# Patient Record
Sex: Female | Born: 1945
Health system: Southern US, Community
[De-identification: ages and names within clinical notes are randomized; demographics above are authoritative.]

## PROBLEM LIST (undated history)

## (undated) DIAGNOSIS — I2510 Atherosclerotic heart disease of native coronary artery without angina pectoris: Secondary | ICD-10-CM

## (undated) DIAGNOSIS — I639 Cerebral infarction, unspecified: Secondary | ICD-10-CM

## (undated) DIAGNOSIS — J45909 Unspecified asthma, uncomplicated: Secondary | ICD-10-CM

## (undated) DIAGNOSIS — I471 Supraventricular tachycardia, unspecified: Secondary | ICD-10-CM

## (undated) DIAGNOSIS — I499 Cardiac arrhythmia, unspecified: Secondary | ICD-10-CM

## (undated) DIAGNOSIS — M5030 Other cervical disc degeneration, unspecified cervical region: Secondary | ICD-10-CM

## (undated) DIAGNOSIS — S62102A Fracture of unspecified carpal bone, left wrist, initial encounter for closed fracture: Secondary | ICD-10-CM

## (undated) DIAGNOSIS — E785 Hyperlipidemia, unspecified: Secondary | ICD-10-CM

## (undated) DIAGNOSIS — M1611 Unilateral primary osteoarthritis, right hip: Secondary | ICD-10-CM

## (undated) DIAGNOSIS — I5020 Unspecified systolic (congestive) heart failure: Secondary | ICD-10-CM

## (undated) DIAGNOSIS — I2690 Septic pulmonary embolism without acute cor pulmonale: Secondary | ICD-10-CM

## (undated) DIAGNOSIS — K219 Gastro-esophageal reflux disease without esophagitis: Secondary | ICD-10-CM

## (undated) DIAGNOSIS — Z9842 Cataract extraction status, left eye: Secondary | ICD-10-CM

## (undated) DIAGNOSIS — M5020 Other cervical disc displacement, unspecified cervical region: Secondary | ICD-10-CM

## (undated) DIAGNOSIS — I447 Left bundle-branch block, unspecified: Secondary | ICD-10-CM

## (undated) DIAGNOSIS — I700 Atherosclerosis of aorta: Secondary | ICD-10-CM

## (undated) DIAGNOSIS — M810 Age-related osteoporosis without current pathological fracture: Secondary | ICD-10-CM

## (undated) DIAGNOSIS — I4719 Other supraventricular tachycardia: Secondary | ICD-10-CM

## (undated) DIAGNOSIS — I441 Atrioventricular block, second degree: Secondary | ICD-10-CM

## (undated) DIAGNOSIS — R0600 Dyspnea, unspecified: Secondary | ICD-10-CM

## (undated) DIAGNOSIS — Z9841 Cataract extraction status, right eye: Secondary | ICD-10-CM

## (undated) DIAGNOSIS — M1990 Unspecified osteoarthritis, unspecified site: Secondary | ICD-10-CM

## (undated) DIAGNOSIS — I428 Other cardiomyopathies: Secondary | ICD-10-CM

## (undated) DIAGNOSIS — I429 Cardiomyopathy, unspecified: Secondary | ICD-10-CM

## (undated) DIAGNOSIS — N179 Acute kidney failure, unspecified: Secondary | ICD-10-CM

## (undated) DIAGNOSIS — L570 Actinic keratosis: Secondary | ICD-10-CM

## (undated) DIAGNOSIS — M48061 Spinal stenosis, lumbar region without neurogenic claudication: Secondary | ICD-10-CM

## (undated) HISTORY — PX: JOINT REPLACEMENT: SHX530

## (undated) HISTORY — DX: Supraventricular tachycardia, unspecified: I47.10

## (undated) HISTORY — DX: Cerebral infarction, unspecified: I63.9

## (undated) HISTORY — DX: Other cervical disc displacement, unspecified cervical region: M50.20

## (undated) HISTORY — PX: OTHER SURGICAL HISTORY: SHX169

## (undated) HISTORY — DX: Actinic keratosis: L57.0

## (undated) HISTORY — DX: Age-related osteoporosis without current pathological fracture: M81.0

## (undated) HISTORY — DX: Unspecified asthma, uncomplicated: J45.909

## (undated) HISTORY — DX: Supraventricular tachycardia: I47.1

## (undated) HISTORY — DX: Cardiomyopathy, unspecified: I42.9

## (undated) HISTORY — DX: Acute kidney failure, unspecified: N17.9

## (undated) HISTORY — PX: APPENDECTOMY: SHX54

## (undated) HISTORY — DX: Fracture of unspecified carpal bone, left wrist, initial encounter for closed fracture: S62.102A

## (undated) HISTORY — PX: CORONARY ANGIOPLASTY: SHX604

## (undated) HISTORY — PX: TONSILLECTOMY: SUR1361

## (undated) HISTORY — DX: Septic pulmonary embolism without acute cor pulmonale: I26.90

## (undated) HISTORY — DX: Hyperlipidemia, unspecified: E78.5

## (undated) NOTE — Interval H&P Note (Signed)
 Formatting of this note might be different from the original. NEUROSURGERY INTERVAL NOTE  01/12/2016   I have personally examined the patient, and gone over  her symptoms, and plan for surgery. There have been no significant interval changes since I last saw the patient.   We will plan to proceed with scheduled Left  lumbar 2-4 hemi-laminectomies, possible adjacent levels,  medial facetectomies and foraminotomies.  The patient's name, medical record number and site of surgery have all been confirmed.  I have explicitly discussed the risks, benefits and alternatives of surgery previously.  I have personally marked the planned site/side of surgery. All of the patient's questions have been answered.  I also discussed risks and options regarding possible blood transfusion, per the [facility] Blood Safety Act.  The patient has demonstrated full understanding of the associated risks of surgery, and would like to proceed with surgery despite the associated risks. The consents have been signed/witnessed, and all of the patient's questions/concerns have been addressed.  I appreciate the opportunity to care for this pleasant patient. Electronically signed by: RIPLEY BROCKS RAO MD 01/12/2016 12:18 PM  Electronically signed by Melanee RIPLEY BROCKS (M.D.), M.D. at 01/12/2016 12:18 PM PDT

## (undated) NOTE — Consults (Signed)
 Associated Order(s): INPATIENT OCCUPATIONAL THERAPY CONSULT Formatting of this note is different from the original. OCCUPATIONAL THERAPY INPATIENT INITIAL ASSESSMENT with PLAN CERTIFICATION  Treatment Diagnosis: decrease in activities of daily living due to LUMBAR RADICULOPATHY [M54.16] LOW BACK PAIN [M54.5]SPINAL STENOSIS OF LUMBAR SPINE [M48.06]ACQUIRED SCOLIOSIS [M41.9]; s/p Left lumbar 2-4 hemi-laminectomies, medial facetectomies and foraminotomies  SUBJECTIVE   Room#: 518 Date of hospitalization: 01/12/2016 Patient c/o pain in back area Current level of pain: 8/10 at rest, 9/10 with activity Cognition: oriented to person, place, situation Behavior: cooperative Prior level of function: no limitation per patient  Job/occupation: employed, occupation: retired Comptroller: selfcare/ housework Language preference: English Cultural/religious considerations for care: Not Applicable Living situation: lives alone in a 1 story house  I have reviewed the medical record for relevant medical history, medication, imaging and testing.  OBJECTIVE   Precautions: fall risk, spine precautions Physical assessment:      Upper extremities:            Range of motion, left:                Shoulder: within normal limits                Elbow: within normal limits                Forearm: within normal limits                Wrist: within normal limits                Finger: within normal limits                Thumb: within normal limits                           Range of motion, right:                Shoulder: within normal limits                Elbow: within normal limits                Forearm: within normal limits                Wrist: within normal limits                Finger: within normal limits                Thumb: within normal limits                  Strength, left:                 Shoulder: 4-/5                Elbow: 4-/5                 Forearm: 4-/5                 Hand: 4-/5           Strength, right:                Shoulder: 3-/5                Elbow: 3+/5                Forearm: 3+/5  Hand: 3+/5           Sensation, left: intact           Sensation, right: intact           Edema, left: within functional limits           Edema, right: within functional limits           Muscle tone, left: within normal limits           Muscle tone, right: within normal limits       Visual-perceptual assessment: within normal limits      Gross motor processing/coordination: bilateral within normal limits      Fine motor processing/coordination: bilateral within normal limits; hand dominance: right      Endurance: fair-      Communication: WNL      ADL evaluation:            Grooming / hygiene: minimal assistance           Feeding: standby assistance           Dressing UE: moderate assistance           Dressing LE: maximal assistance           Bathing UE: not tested           Bathing LE: not tested           Toileting: not tested                     Bed mobility: moderate assistance           Transfers: moderate assistance Balance:            Sitting balance, static: within normal limits           Sitting balance, dynamic: within normal limits           Standing balance, static: impaired: fair+           Standing balance, dynamic: impaired: fair   LEARNING ASSESSMENT:  Learning preferences: verbal instructions and demonstration Learning barrier/s: none Educational needs: ADL, use of devices/equipment, safety and postural  ASSESSMENT: Impairments: impaired strength, impaired endurance, impaired balance, impaired functional mobility, impaired selfcare  Functional limitations: difficulty with eating/ grooming/ hygiene, difficulty with upper body dressing, difficulty with lower body dressing, difficulty with bed mobility, difficulty with functional transfers Clinical Indication:    1. Patient is at a risk for falls due to impaired balance,  decreased strength    2. Patient has impaired mobility due to LUMBAR RADICULOPATHY [M54.16] LOW BACK PAIN [M54.5]SPINAL STENOSIS OF LUMBAR SPINE [M48.06]ACQUIRED SCOLIOSIS [M41.9]; s/p Left lumbar 2-4 hemi-laminectomies, medial facetectomies and foraminotomies    3. Patient is unable to complete ADL tasks due to impaired endurance, pain  Patient will benefit from additional occupational therapy intervention based on above findings.  PLAN OF CARE  Patient goal statement: To go home Long term goal/s: Patient will demonstrate improved self care / ADL:  3/3 recall of spine precautions with good understanding, independent with eating, independent with grooming/ hygiene, independent with upper body dressing, minimal assistance with lower dressing,  standby assistance with bed mobility, standby assistance with functional transfers Rehabilitation potential for above goals: good Above goals to be met within 30 days  TREATMENT GIVEN Education given: activities of daily living training, body positioning and safety awareness Treatment given: activities of daily living x 25 minutes Response to treatment:      Level of  pain: 9/10       3/3 recall of spine precautions noted post education, review. Moderate cues provided with tasks for safety, positioning, body mechanics with fair carryover noted.  Disposition: call button in reach, in chair with seatbelt on, in bed 3 rails and with bed alarm on no apparent distress, nursing staff notified and with staff  Responsibility of patient care and safety returned to nursing, and nursing agrees.  PLAN Treatment plan: assess equipment needs, balance retraining, bed mobility training, body mechanics, caregiver instruction, energy conservation, joint protection, patient / family education, positioning, ROM, safety training, strengthening, therapeutic exercises, transfer training and activities of daily living training  Frequency of visits: 3 times per week for 4  weeks Reassessment: patient to be reassessed in 30 days, or earlier if indicated.  RECOMMENDATIONS Continue occupational therapy, depending on patient progress  Equipment Recommended:  front wheeled walker, commode, and shower chair  I have discussed the relative risks, benefits, and alternatives for treatment of this problem with the patient.  Patient verbalizes understanding and agrees with treatment program.  Ordering physician:  Please document your approval of the plan of care by co-signing this initial assessment note. If you do not approve the plan, please notify the therapist of disapproval and/or modification to the plan.   GERALD B. ARAFOL, OTR/L Rzoo#[id number] 01/13/2016   Total Treatment Minutes: 45 Total Timed Code Minutes: 25   Electronically signed by Melanee Ripley BROCKS (M.D.), M.D. at 01/13/2016  5:58 PM PDT

## (undated) NOTE — Progress Notes (Signed)
 Formatting of this note might be different from the original. Discharge  Time: 1708  To: home  Accompanied by: son Via: WHEELCHAIR Nursing summary at discharge: PT WAS GIVEN HER DC INSTRUCTIONS VERBALLY AND WRITTEN. PT UNDERSTANDS HER DC EDUCATIONS AND AGREES WITH DC PLAN FOR HOME TODAY. HOSPITAL BED WILL BE DELIVERED TONIGHT. SON IS TAKING PT HOME.  Electronically signed by: BECKEY JACKSON HARRIER RN 01/14/2016 5:10 PM    Electronically signed by HARRIER BECKEY JACKSON (R.N.), R.N. at 01/14/2016  5:10 PM PDT

## (undated) NOTE — Anesthesia Postprocedure Evaluation (Signed)
 Formatting of this note might be different from the original. ANESTHESIA POST-OP NOTE  BP 136/77 mmHg  Pulse 90  Temp(Src) 98.6 F (37 C)  Resp 20  Ht 1.702 m (5' 7)  Wt 99.7 kg (219 lb 12.8 oz)  BMI 34.42 kg/m2  SpO2 98%  Breastfeeding? No Patient is post op day #1, s/p Procedure(s): Left  lumbar 2-4 hemi-laminectomies, medial facetectomies and foraminotomies No apparent anesthetic complications.Encouraged Pt to do incentive spirometry. BERNARDINO MARS REDDY MD 01/13/2016 11:26 AM  Electronically signed by Jess BERNARDINO MARS (M.D.), M.D. at 01/13/2016 11:26 AM PDT

## (undated) NOTE — Progress Notes (Signed)
 Formatting of this note might be different from the original. 1725: Pt taken off unit by transportation team in wheelchair with son by her side.  Electronically signed by: BECKEY JACKSON HARRIER RN 01/14/2016   Electronically signed by HARRIER BECKEY JACKSON (R.N.), R.N. at 01/14/2016  5:22 PM PDT

## (undated) NOTE — OR Nursing (Signed)
 Formatting of this note might be different from the original. 1746:  TRANSFERRED PATIENT TO PACU VIA BED  SUPPORTED WITH O2 INHALATION VIA MASK.  REPORT GIVEN TO M. CALDERON, PACU RN.  V/S STABLE.  DRESSINGS DRY AND INTACT TO BACK.Electronically signed by: HADASSAH NELTA KRABBE RN 01/12/2016 5:57 PM  Electronically signed by KRABBE HADASSAH NELTA (R.N.), R.N. at 01/12/2016  6:03 PM PDT

## (undated) NOTE — Progress Notes (Signed)
 Formatting of this note is different from the original. NEUROSURGERY PROGRESS NOTE HOSPITAL DAY: 2  Per Dr. Melanee H&P HPI: Audrey Martinez is a 40 year old female who apparently had a fall at work in 2011. Subsequent to that in 2000 while she started noticing pain and numbness down the LEFT leg up to the thigh and knee. Over the last couple years the symptoms have worsened especially the numbness in the LEFT knee and shin with pain that radiates from the hip and groin down to the knee. Symptoms are worse when she stands or walks and is improved by sitting down. She does tend to lean on the cart when she shops to help relieve the leg symptoms. She denies any RIGHT leg pain. Leg pain is worse than the back and she rates it as 8 on a scale of 1-10. She denies any bowel or bladder dysfunction. She feels that her walking tolerance is getting worse over time Non operative treatments:  Physical therapy: yes Injections: 3 ESI Medications: norco 5/325 twice a day  INTERVAL HPI 01/01/2016  Patient continues to have low back pain that radiates down the LEFT leg down to the thigh and knee and occasionally into the shin. This is worse when she stands and walks. She does note some numbness in the RIGHT foot but no RIGHT leg pain. She denies any bowel or bladder dysfunction. She feels that she has exhausted nonoperative treatments.  DAILY UPDATE: 8/16 POD#1: s/p LEFT L2-4 hemilaminectomies and partial facetectomies, foraminotomies for lateral recess and foraminal decompression of L3 and L4 nerve roots. Doing well post-op. Pain well controlled with minimal use of PCA.  8/17 POD#2: No acute events overnight. Ambulated 13ft with PT. Pain well controlled.   CURRENT INPATIENT MEDICATIONS Scheduled Medications    HYDROcodone -Acetaminophen  1 tablet Oral Q3H  Lactated Ringers  1,000 mL intraVENOUS X1  Multivitamin Therapeutic-Iron-Calcium -FA-Minerals 1 tablet Oral Daily  Gabapentin  900 mg Oral QHS   Baclofen  10 mg Oral TID  dilTIAZem  120 mg Oral Daily  Sodium Chloride   intraVENOUS Q8H  Docusate Sodium  250 mg Oral QHS  Famotidine  20 mg Oral BID  Famotidine  (PF) 20 mg intraVENOUS BID  Or     Famotidine  20 mg Oral BID  Acetaminophen  650 mg Oral Q8H  Docusate Sodium  200 mg Oral BID  Heparin  Porcine 5,000 Units Subcutaneous Q12H   IV Medications   Sodium Chloride  0.9 % with KCl intraVENOUS Last Rate: Stopped (01/13/16 1630)   PRN Medications   Sodium Chloride   intraVENOUS PRN  Magnesium Hydroxide 30 mL Oral Daily PRN  Sennosides 17.2 mg Oral QHS PRN  Ondansetron  4 mg Oral Q6H PRN  hydrALAZINE  10 mg intraVENOUS Q3H PRN  Naloxone 0.2 mg intraVENOUS Q2MIN PRN  Acetaminophen  650 mg Oral Q4H PRN  Or     Acetaminophen  650 mg Rectal Q4H PRN  HYDROmorphone 0.4 mg intraVENOUS Q2H PRN  HYDROcodone -Acetaminophen  2 tablet Oral Q4H PRN  Acetaminophen  650 mg Oral Q4H PRN  Or     Acetaminophen  650 mg Rectal Q4H PRN  Lactulose  30 mL Oral TID PRN  Ondansetron  (PF) 4 mg intraVENOUS Q6H PRN  HYDROmorphone 0.4 mg intraVENOUS Per protocol - multiple doses    VITAL SIGNS Filed Vitals:   01/13/16 1631 01/13/16 1945 01/13/16 2312 01/14/16 0300  BP: 122/77 150/95 141/75 148/84  Pulse: 100 95 104 92  Temp: 98.8 F (37.1 C) 97 F (36.1 C) 97.3 F (36.3 C) 97 F (36.1 C)  Resp: 15 18 18 18   Height:  Weight:      SpO2: 95% 96% 95% 92%    Intake/Output Summary (Last 24 hours) at 01/14/16 0555 Last data filed at 01/14/16 0500  Gross per 24 hour  Intake   1737 ml  Output   2300 ml  Net   -563 ml   Temp (24hrs), Avg:97.5 F (36.4 C), Min:96.5 F (35.8 C), Max:98.8 F (37.1 C)  LABS No results for input(s): WBC, HGB, HCT, PLT, NA, K, CL, CO2, BUN, CR, CA, MG, PHOS, GFR, RBS, DIL, PTT, PT, INR in the last 72 hours.  BLOOD GLUCOSE No data found.  PHYSICAL EXAM:  NEURO: GCS 15, A&Ox4 CN II-XII intact and symmetric, no focal deficit  MOTOR: RUE 5/5 LUE 5/5 RLE 5/5 LLE  5/5  SENSATION: intact to light touch No hyperreflexia No drift, ataxia, or dysmetria  INCISION: wound is clean, dry  IMAGING: No new imaging.   IMPRESSION: Audrey Martinez is a 30 year old female s/p LEFT L2-4 hemilaminectomies and partial facetectomies, foraminotomies for lateral recess and foraminal decompression of L3 and L4 nerve roots.   PLAN: Neuro: - Pain control  - PT/OT  - DC planning.  CV: SBP 100-140 Pulm: ISO 10x per hour GI: Bowel regimen, GI ppx FEN: keep Na 135 - 145, maintain euvolemia, Reg diet GU: voiding independently  ID: afebrile Endo: keep BS 80-160 DVT PPx: TED / SCD  Activity: UOOB to chair TID, Ambulate QID out in halls with assistance Therapies: PT/OT Dispo: CM for discharge planning  Thank you for the opportunity to participate in this patient's care.   Please page neurosurgery at 2020 with any questions, comments, concerns, or for any change in neurologic exam.  Ollis Siva, D.O. Neurosurgery Resident, PGY-3 [facility], pager [phone] 01/14/2016 5:55 AM   Electronically signed by Melanee Ripley BROCKS (M.D.), M.D. at 01/14/2016  1:47 PM PDT  Associated attestation - Melanee Ripley BROCKS (M.D.), M.D. - 01/14/2016  1:47 PM PDT Formatting of this note might be different from the original. Neurosurgery Attending Note    I  Have seen   the patient and agree with the neurosurgery resident's note and treatment plan.   She is ambulating well, able to walk on heel/toes Lumbar incision with dry dressing  ASSESSMENT/PLAN  - d/c to home today - - F/u in NS in 2 weeks for wound check with RN - F/u with Dr. Melanee in 4-6 weeks   - No driving for 4 weeks - Home PT to see patient in 2 weeks to assess mobility - Referral to outpatient PT to start in 4-6 weeks (2-3 x a week for 3-4 weeks):  For core strengthening and lumbar stabilization exercises)   Electronically signed  by: RIPLEY BROCKS RAO MD 01/14/2016 1:46 PM

## (undated) NOTE — Progress Notes (Signed)
 Formatting of this note might be different from the original. 730: DURING REPORT NKE PT STARTED TO VOMIT HER JELLO SHE HAD FOR DINNER. GAVE ONE DOSE OF ZOFRAN  X1.  Electronically signed by: BECKEY JACKSON HARRIER RN 01/13/2016 7:44 AM  Electronically signed by HARRIER BECKEY JACKSON (R.N.), R.N. at 01/13/2016  7:44 AM PDT

## (undated) NOTE — OR Nursing (Signed)
 Formatting of this note might be different from the original. 1508:  RELIEVED R. MUNOZ, RN FOR CHANGE OF SHIFT.  REPORT RECEIVED FROM ABOVE RN.  SURGERY IN PROGRESS AT THIS TIME. NO SPECIMEN AT THIS TIME. Electronically signed by: HADASSAH NELTA KRABBE RN 01/12/2016 3:15 PM  Electronically signed by KRABBE HADASSAH NELTA (R.N.), R.N. at 01/12/2016  3:15 PM PDT

## (undated) NOTE — Discharge Summary (Signed)
 Formatting of this note might be different from the original. OCCUPATIONAL THERAPY DISCHARGE NOTE  Patient is discharged from occupational therapy services. Please refer to previous notes for details.  GERALD B. ARAFOL, OTR/L Rzoo#[id number]  01/22/2016   Electronically signed by Marvene Elna Hsu (O.T.), O.T. at 01/22/2016  3:55 PM PDT

## (undated) NOTE — Discharge Summary (Signed)
 Formatting of this note might be different from the original. PHYSICAL THERAPY Discharge note  SUBJECTIVE:      Referring diagnosis: LUMBAR RADICULOPATHY [M54.16] LOW BACK PAIN [M54.5] SPINAL STENOSIS OF LUMBAR SPINE [M48.06] ACQUIRED SCOLIOSIS [M41.9]      Date of last initial evaluation: 01/13/16      Treatment number: 2      Current level of pain: 0/10      Precautions: spinal precautions  OBJECTIVE:  Functional Mobility:       Rolling: stand by assistance/supervision      Scooting:  stand by assistance/supervision           Supine to sitting: stand by assistance/supervision      Sitting to supine: stand by assistance/supervision      Sitting to standing: stand by assistance/supervision       Standing to sitting: stand by assistance/supervision       Transfer chair to bed: not tested      Transfer bed to chair: stand by assistance/supervision      Transfers toilet: not tested  Gait:       right weight bearing status: full weight bearing Device: front wheeled walker Level of assistance: stand by assistance/supervision Number of persons needed to assist: 1 (due to IV pole) Distance: 350 feet Gait deviations: velocity       Endurance: Fair  Balance:       Sitting balance, static: within normal limits      Sitting balance, dynamic: within normal limits      Standing balance, static: within normal limits      Standing balance, dynamic: within normal limits  PLAN: patient discharged to home   ARLEY RAMAN DAVEY PT 01/15/2016   Electronically signed by Nathaneil ARLEY RAMAN (P.T.), P.T. at 01/15/2016  1:17 PM PDT

## (undated) NOTE — Progress Notes (Signed)
 Formatting of this note might be different from the original. Problem: General Plan of Care (Adult, Obstetrics) Goal: Plan of Care Review Outcome: Ongoing (interventions implemented as appropriate) SUMMARY OF PATIENT PROGRESS:  STATUS: Improving  Primary Problem: SP LEFT L2-4 hemilaminectomies and partial facetectomies, foraminotomies for lateral recess and foraminal decompression of L3 and L4 nerve roots.   Key findings related to primary problem: PT PCA PUMP DC'D AND IS ABLE TO TOLERATE PO INTAKE BETTER NO MORE N/V NOTED. PT SAT IN CHAIR FOR 2 HOURS WORKED WITH P/T AND O/T. DOING WELL NOTED TO BE ABLE TO GET OUT OF BED TO COMMODE WELL. PT PAIN UNDER CONTROL WITH PO PAIN PILLS. PT IS ABLE TO HELP WITH SELF TURNS AND REPOSITIONED. DRESSING TO LOWER BACK SIDE IS INTACT.   Other key findings: WILL CONT WITH CURRENT PLAN OF CARE. Electronically signed by: BECKEY JACKSON HARRIER RN 01/13/2016 5:15 PM     Electronically signed by HARRIER BECKEY JACKSON (R.N.), R.N. at 01/13/2016  5:15 PM PDT

## (undated) NOTE — Progress Notes (Signed)
 Formatting of this note is different from the original. NEUROSURGERY PROGRESS NOTE HOSPITAL DAY: 1  Per Dr. Melanee H&P HPI: Audrey Martinez is a 55 year old female who apparently had a fall at work in 2011. Subsequent to that in 2000 while she started noticing pain and numbness down the LEFT leg up to the thigh and knee. Over the last couple years the symptoms have worsened especially the numbness in the LEFT knee and shin with pain that radiates from the hip and groin down to the knee. Symptoms are worse when she stands or walks and is improved by sitting down. She does tend to lean on the cart when she shops to help relieve the leg symptoms. She denies any RIGHT leg pain. Leg pain is worse than the back and she rates it as 8 on a scale of 1-10. She denies any bowel or bladder dysfunction. She feels that her walking tolerance is getting worse over time Non operative treatments:  Physical therapy: yes Injections: 3 ESI Medications: norco 5/325 twice a day  INTERVAL HPI 01/01/2016  Patient continues to have low back pain that radiates down the LEFT leg down to the thigh and knee and occasionally into the shin. This is worse when she stands and walks. She does note some numbness in the RIGHT foot but no RIGHT leg pain. She denies any bowel or bladder dysfunction. She feels that she has exhausted nonoperative treatments.  DAILY UPDATE: 8/16 POD#1: s/p LEFT L2-4 hemilaminectomies and partial facetectomies, foraminotomies for lateral recess and foraminal decompression of L3 and L4 nerve roots. Doing well post-op. Pain well controlled with minimal use of PCA.   CURRENT INPATIENT MEDICATIONS Scheduled Medications    Lactated Ringers  1,000 mL intraVENOUS X1  Multivitamin Therapeutic-Iron-Calcium -FA-Minerals 1 tablet Oral Daily  Gabapentin  900 mg Oral QHS  Baclofen  10 mg Oral TID  dilTIAZem  120 mg Oral Daily  Sodium Chloride   intraVENOUS Q8H  ceFAZolin in Dextrose  1 g intraVENOUS Q8H   Docusate Sodium  250 mg Oral QHS  Famotidine  20 mg Oral BID  Famotidine  (PF) 20 mg intraVENOUS BID  Or     Famotidine  20 mg Oral BID  Acetaminophen  1,000 mg intraVENOUS Q8H  Followed by     Acetaminophen  650 mg Oral Q8H  Docusate Sodium  200 mg Oral BID  Heparin  Porcine 5,000 Units Subcutaneous Q12H  HYDROmorphone 0.2 mg/ml  intraVENOUS see instruction   IV Medications   Sodium Chloride  0.9 % with KCl intraVENOUS Last Rate: 1,000 mL (01/12/16 2247)   PRN Medications   Sodium Chloride   intraVENOUS PRN  Magnesium Hydroxide 30 mL Oral Daily PRN  Sennosides 17.2 mg Oral QHS PRN  Ondansetron  4 mg Oral Q6H PRN  hydrALAZINE  10 mg intraVENOUS Q3H PRN  Naloxone 0.2 mg intraVENOUS Q2MIN PRN  Acetaminophen  650 mg Oral Q4H PRN  Or     Acetaminophen  650 mg Rectal Q4H PRN  HYDROcodone -Acetaminophen  1 tablet Oral Q4H PRN  HYDROmorphone 0.4 mg intraVENOUS Q2H PRN  HYDROcodone -Acetaminophen  2 tablet Oral Q4H PRN  Acetaminophen  650 mg Oral Q4H PRN  Or     Acetaminophen  650 mg Rectal Q4H PRN  Lactulose  30 mL Oral TID PRN  Ondansetron  (PF) 4 mg intraVENOUS Q6H PRN  HYDROmorphone 0.4 mg intraVENOUS Per protocol - multiple doses    VITAL SIGNS Filed Vitals:   01/12/16 2045 01/12/16 2222 01/12/16 2238 01/12/16 2353  BP: 137/94 139/85  126/73  Pulse: 98 117  105  Temp: 97.7 F (36.5 C) 97.1 F (36.2 C)  97.7 F (36.5 C)  Resp: 16 18  18   Height:   1.702 m (5' 7)   Weight:   99.7 kg (219 lb 12.8 oz)   SpO2: 98% 97%  95%    Intake/Output Summary (Last 24 hours) at 01/13/16 0437 Last data filed at 01/13/16 0300  Gross per 24 hour  Intake   2625 ml  Output    700 ml  Net   1925 ml   Temp (24hrs), Avg:98 F (36.7 C), Min:97.1 F (36.2 C), Max:98.6 F (37 C)  TRANSFUSION HISTORY THIS ENCOUNTER No data found.  LABS No results for input(s): WBC, HGB, HCT, PLT, NA, K, CL, CO2, BUN, CR, CA, MG, PHOS, GFR, RBS, DIL, PTT, PT, INR in the last 72 hours.  BLOOD GLUCOSE No data  found.  PHYSICAL EXAM:  NEURO: GCS 15, A&Ox4 CN II-XII intact and symmetric, no focal deficit  MOTOR: RUE 5/5 LUE 5/5 RLE 5/5 LLE 5/5  SENSATION: intact to light touch No hyperreflexia No drift, ataxia, or dysmetria  INCISION: wound is clean, dry  IMAGING: No new imaging.   IMPRESSION: Audrey Martinez is a 61 year old female s/p LEFT L2-4 hemilaminectomies and partial facetectomies, foraminotomies for lateral recess and foraminal decompression of L3 and L4 nerve roots.   PLAN: Neuro: - Pain control   * Dilaudid PCA -- d/c today  * Start PO meds today - PT/OT   CV: SBP 100-140 Pulm: ISO 10x per hour GI: Bowel regimen, GI ppx FEN: keep Na 135 - 145, maintain euvolemia, Reg diet GU: voiding independently  ID: afebrile Endo: keep BS 80-160 DVT PPx: TED / SCD  Activity: UOOB to chair TID, Ambulate QID out in halls with assistance Therapies: PT/OT Dispo: CM for discharge planning  Thank you for the opportunity to participate in this patient's care.   Please page neurosurgery at 2020 with any questions, comments, concerns, or for any change in neurologic exam.  Ollis Siva, D.O. Neurosurgery Resident, PGY-3 [facility], pager [phone] 01/13/2016 4:37 AM   Electronically signed by Melanee Ripley BROCKS (M.D.), M.D. at 01/13/2016  8:56 AM PDT  Associated attestation - Melanee Ripley BROCKS (M.D.), M.D. - 01/13/2016  8:56 AM PDT Formatting of this note is different from the original. NEUROSURGERY ATTENDING PROGRESS NOTE    I have seen and examined the patient, reviewed any relevant imaging/labs and agree with the neurosurgery resident's note and plan.    INTERVAL HPI 01/13/2016  Patient without leg pain,  C/o nausea  Exam:    Filed Vitals:   01/12/16 2222 01/12/16 2238 01/12/16 2353 01/13/16 0430  BP: 139/85  126/73 141/94  Pulse: 117  105 102  Temp: 97.1 F (36.2 C)  97.7 F (36.5 C) 97 F  (36.1 C)  Resp: 18  18 16   Height:  1.702 m (5' 7)    Weight:  99.7 kg (219 lb 12.8 oz)    SpO2: 97%  95% 94%    Incision:  Dressing dry  Neuro: Mental status:  Alert, interactive Motor: strength 5/5 in BLEs  Sens: intact to TL/pin in BLE except for some subjective baseline numbness in right foot     PLAN  - d/c PCA and start oral pain meds, nausea likely due to PCA  -PT/OT, encourage ambulation  Electronically signed by: RIPLEY BROCKS RAO MD 01/13/2016 8:54 AM

## (undated) NOTE — Consults (Signed)
 Associated Order(s): INPATIENT PHYSICAL THERAPY CONSULT Formatting of this note is different from the original. PHYSICAL THERAPY INPATIENT INITIAL ASSESSMENT with PLAN CERTIFICATION   Treatment diagnosis: LUMBAR RADICULOPATHY [M54.16] LOW BACK PAIN [M54.5] SPINAL STENOSIS OF LUMBAR SPINE [M48.06] ACQUIRED SCOLIOSIS [M41.9] and Decreased Functional Mobility  Prior Level of Function: Patient was independent with gait and activities of daily living.  Room:0518 Precautions: fall risk, spinal precautions   Functional Assessment:      Rolling: stand by assistance/supervision      Scooting: stand by assistance/supervision      Supine to sitting: not tested      Sitting to supine: contact guard assist      Sitting to standing: contact guard assist       Standing to sitting: contact guard assist       Transfer chair to bed: contact guard assist       Transfer bed to chair: not tested      Transfers to and from toilet: not tested      Transfers chair to and from standing: contact guard assist Gait: weight bearing status: full weight bearing Device: front wheeled walker Level of assistance: contact guard assist Number of persons needed to assist: 1 Distance: 75 feet  Gait deviations: decreased velocity and shorter step lengths Endurance:  Fair     ASSESSMENT              Patient is agreeable to evaluation and treatment. Patient was alert, oriented, and humorous.  Therapist re-educated the spinal precautions with the patient.  Patient demonstrated understanding of  spinal precautions successfully. Patient had slight numbness and tingling on right lower extremity down the curls of toes. Patient required contact guard assist due to some overall weakness, decreased strength, activity tolerance, endurance, balance, and gait safety.  Patient was educated on how to get into bed with the proper logroll technique. Patient complained of nausea and a twisting of the stomach feeling limiting ambulation.   Patient will benefit from therapy, a front-wheeled walker, wheelchair and assist  Impairments: LUMBAR RADICULOPATHY [M54.16] LOW BACK PAIN [M54.5] SPINAL STENOSIS OF LUMBAR SPINE [M48.06] ACQUIRED SCOLIOSIS [M41.9] and Decreased strength, activity tolerance, endurance, balance, and gait safety. Functional limitations: Impaired independence with transfers, gait, bed mobility, and balance.  Clinical Indications:  Impaired transfers and gait secondary to decreased strength/ balance/ safety awareness . Will require further physical therapy to return to prior level of function .           Patient will benefit from additional physical therapy intervention based on above findings.  PLAN (while in the hospital) Treatment plan: assess equipment needs, bed mobility training, endurance training, fall recovery training, gait training, progressive, patient / family education, safety training, strengthening, therapeutic exercises, transfer training and wheelchair mobility training    Frequency of visits: 5 times per week for 4 weeks. Reassessment: patient to be reassessed in 7 days, or earlier if indicated. (The patient?s plan of care was determined at the time of this initial evaluation.  If there is a significant change in the patient?s functional status during this episode of care, this plan may be updated)  RECOMMENDATIONS  Continue physical therapy and assist  Patient may benefit from daily skilled therapy with good potential to progress towards prior level of function Equipment recommendation: Front-wheeled Walker, Wheelchair and Commode  Durable medical equipment and disposition plans/needs expected to evolve based on patient progress and medical needs.   SUBJECTIVE         Admission Date:  01/12/2016       Current level of pain: 0/10 at rest, 2/10 with activity      Living situation: alone in a single story      Prior level of function:  no limitations  I have reviewed the medical record for  relevant medical history, medication, imaging and testing: Past Medical History: (acquired from medical chart review) Patient  has a past medical history of ASTHMA, PERSISTENT, CONTROLLED (06/04/2004); HYPERLIPIDEMIA (05/12/2003); TRANSIENT CEREBRAL ISCHEMIA (04/22/2003); SUPRAVENTRICULAR TACHYCARDIA, PAROXYSMAL (11/28/2007); and ATRIOVENTRICULAR NODE ABLATION (02/01/2008).   OBJECTIVE      Cognition: oriented to person, place, date, situation     Upper Extremity:           Range of motion: Left                                Grossly: within normal limits                                      Range of motion: Right                                 active range of motion:  shoulder elevation about 100 degrees due to previous reverse shoulder replacement                The rest are within normal limits          Strength: Left                Grossly: 4+/5 (good plus)                Strength: Right                Shoulder elevation 3/5                The rest are within normal limits                           Sensation: Left intact           Sensation: Right impaired           Coordination: Left within normal limits           Coordination: Right within normal limits           Edema: Left none           Edema: Right none           Muscle tone, left: within normal limits           Muscle tone, right: within normal limits       Lower Extremity:           Range of motion: Left                                Grossly: within normal limits                                      Range of motion: Right  active range of motion:  Hip flexion about 15 degrees in sitting due to weakness               The rest are within normal limits                   Strength, left:                Hip: 3/5 (fair)                   Knee: 5/5 (normal)                 Ankle: 5/5 (normal)             Strength, right:                Hip: 3-/5 (fair minus)                   Knee: 5/5 (normal)                  Ankle: 5/5 (normal)             Sensation: Left intact           Sensation: Right impaired           Coordination: Left within normal limits           Coordination: Right within normal limits           Edema: Left none           Edema: Rightnone           Muscle tone, left: within normal limits           Muscle tone, right: within normal limits  Balance:       Sitting balance, static: within normal limits      Sitting balance, dynamic: within normal limits      Standing balance, static: within normal limits      Standing balance, dynamic: within normal limits  Endurance: Fair  LEARNING ASSESSMENT Learning preferences: none apparent Learning barrier/s: none Educational needs: ADL, use of devices/equipment, safety and ergonomic set-up  HOSPITAL PLAN OF CARE [See above for ASSESSMENT and PLAN]  GOALS Long term goal/s: bed mobility: independent for functional goal of getting out of bed safely and successfully following spinal precautions transfers: independent for functional goal of moving from a bed to a chair safely ambulation: independent with no device for functional goal of walking around the home safely to perform activities of daily living  patient/caregiver will be independent with home exercise program precautions: patient able to verbalize/demonstrate safe transfer technique Rehabilitation potential for above goals: good  TREATMENT GIVEN Education given: instructed patient about spinal precautions adapted activities of daily living energy conservation exercises for ROM / strengthening / mobility  home exercise program (HEP) mobility training  safety  self-management and wellness within the plan of care Treatment given: adapted activities of daily living techniques / adapted equipment , assess equipment needs , balance retraining , bed mobility training , body mechanics , energy conservation , gait training, progressive , instruction in home exercise program  , patient / family education , safety training , therapeutic exercises  and transfer training  with verbal cues and tactile cues for safety Response to treatment: Fair+ Exercises:  Patient also performed ankle pumps x 10, long arc quads x 10 while sitting in a chair.  Disposition:  alarm on, call button in reach, in  chair, no apparent distress, nursing staff notified, with family / friends and with staff  Responsibility of patient care and safety returned to nursing staff, nursing agrees. MD and RN notified of findings.   I have discussed the relative risks, benefits, and alternatives for treatment of this problem with the patient/family.  Patient/family verbalizes understanding and agrees with treatment program.  Ordering physician:  Please document your approval of the plan of care by co-signing this initial assessment note. If you do not approve the plan, please notify the therapist of disapproval and/or modification to the plan.   ALEX JAMIL CASTRO, DPT Extension: [id number]  01/13/2016   Total Treatment minutes: 50 Total Timed Code minutes: 38  Electronically signed by Melanee Ripley BROCKS (M.D.), M.D. at 01/13/2016  5:58 PM PDT

## (undated) NOTE — Progress Notes (Signed)
 Formatting of this note is different from the original. History  Smoking status  ? Never Smoker   Smokeless tobacco  ? Never Used   FINAL TOBACCO ASSESSMENT  Electronically signed by: WYLENE LITTIE STALLION RN 01/13/2016 [facility]  [address] [place] [address] [phone] Electronically signed by STALLION WYLENE LITTIE (R.N.), R.N. at 01/13/2016  8:27 AM PDT

## (undated) NOTE — Progress Notes (Signed)
 Formatting of this note is different from the original. RAPID RESPONSE LIAISON PROACTIVE ROUNDING NOTE  Audrey Martinez 17 year old female, was identified by the rapid response liaison nurse for proactive rounds.  Arrival time: 2310  Primary Reason on Proactive Rounding List PCA PUMP  Situation PROACTIVE ROUNDS  Background PER MD NOTE Audrey Martinez is a 72 year old female with back pain, left sided leg pain and numbness in L3 and L4 with LEFT L2-3 and L3-4 lateral recess stenosis, LEFT L3-4 foraminal stenosis, and mild scoliosis with lateral listhesis Status Post: Left L2-4 hemilaminectomies and partial facetectomies, foraminotomies for lateral recess and foraminal decompression of L3 and L4 nerve roots.  Assessment  Filed Vitals:   01/12/16 1945 01/12/16 2045 01/12/16 2222 01/12/16 2238  BP: 120/83 137/94 139/85   Pulse: 79 98 117   Temp: 97.8 F (36.6 C) 97.7 F (36.5 C) 97.1 F (36.2 C)   Resp: 17 16 18    Height:    1.702 m (5' 7)  Weight:    99.7 kg (219 lb 12.8 oz)  SpO2: 97% 98% 97%    Assessment findings:  LYING IN BED ON 3L NASAL CANNULA. A/0 X 3. DENIES PAIN AT THIS TIME. PCA PUMP CHECKED AGAINST MAR ORDER; MATCH. PRN NARCAN AVAILABLE PER MAR.  PCA ORDER IS AS FOLLOWS:  MEDICATION: DILAUDID Continuous Rate: NONE  PCA Dose: 0.2 MG  PCA Lockout: 10 minutes  4 Hour Limit: 3 MG  Recommendations / Interventions Airway: ENCOURAGE IS Circulation: None Other: None   Physician Notified? NA  Outcome No change in level of care   Thank you for your continued support of the Rapid Response Liaison. For questions call [phone], or dial [phone] to activate the Rapid Response Team  Electronically signed by: VANESSA LEIGH JUNE RN 01/12/2016 2320 PM  Electronically signed by June, Vanessa Leigh (R.N.), R.N. at 01/12/2016 11:16 PM PDT

## (undated) NOTE — Progress Notes (Signed)
 Formatting of this note might be different from the original. Problem: General Plan of Care (Adult, Obstetrics) Goal: Plan of Care Review Outcome: Ongoing (interventions implemented as appropriate) SUMMARY OF PATIENT PROGRESS:  STATUS: No change  Primary Problem: L2-L4 hemilaminectomies  Key findings related to primary problem: pt alert and oriented. Dressing to lower back clean and dry. Pain controlled. Pt in bed continent, uses bedpan. Encouraged ambulation. No numbness or tingling both lower extremities. VSS.   Other key findings:      Electronically signed by Zamora, Corazon (R.N.), R.N. at 01/14/2016  2:49 AM PDT

## (undated) NOTE — OR PostOp (Signed)
 Formatting of this note might be different from the original. 1835- Dr. Thornell at bedside assessing pt.   8153- Son Richardean at bedside.  2046- Endorse report to [employer]. Pt waiting for medical surgical bed. Dressing to back in place. Pain to back 4/10 acceptable at this time. Son at bedside. Pt resting comfortably waiting for bed. Vital sign stable.  2158- Endorse report to Ronnald Showman Rn pt going to room 518 in stable condition with PCA dilaudid.   2206- out Electronically signed by Javier Hemp (R.N.), R.N. at 01/12/2016 10:06 PM PDT

## (undated) NOTE — Discharge Summary (Signed)
 Formatting of this note is different from the original. NEUROSURGERY DISCHARGE SUMMARY Attending Dr Melanee DATE OF ADMISSION: 01/12/2016 11:07 AM DATE OF DISCHARGE: 01/14/2016  Charlies VEAR Rummer, 13 year old female status post Procedure(s) (LRB): Left  lumbar 2-4 hemi-laminectomies, medial facetectomies and foraminotomies (Bilateral) 2 Days Post-Op done by Surgeon(s): Melanee Ripley BROCKS (M.D.), M.D. Thornell Cadet (M.D.), M.D.  SUMMARY OF HOSPITAL COURSE:  ELAH AVELLINO is a 51 year old female s/p LEFT L2-4 hemilaminectomies and partial facetectomies, foraminotomies for lateral recess and foraminal decompression of L3 and L4 nerve roots.  Per Dr. Melanee H&P HPI: DULA HAVLIK is a 7 year old female who apparently had a fall at work in 2011. Subsequent to that in 2000 while she started noticing pain and numbness down the LEFT leg up to the thigh and knee. Over the last couple years the symptoms have worsened especially the numbness in the LEFT knee and shin with pain that radiates from the hip and groin down to the knee. Symptoms are worse when she stands or walks and is improved by sitting down. She does tend to lean on the cart when she shops to help relieve the leg symptoms. She denies any RIGHT leg pain. Leg pain is worse than the back and she rates it as 8 on a scale of 1-10. She denies any bowel or bladder dysfunction. She feels that her walking tolerance is getting worse over time Non operative treatments: Physical therapy: yes Injections: 3 ESI Medications: norco 5/325 twice a day INTERVAL HPI 01/01/2016 Patient continues to have low back pain that radiates down the LEFT leg down to the thigh and knee and occasionally into the shin. This is worse when she stands and walks. She does note some numbness in the RIGHT foot but no RIGHT leg pain. She denies any bowel or bladder dysfunction. She feels that she has exhausted nonoperative treatments. DAILY UPDATE: 8/16 POD#1: s/p LEFT L2-4  hemilaminectomies and partial facetectomies, foraminotomies for lateral recess and foraminal decompression of L3 and L4 nerve roots. Doing well post-op. Pain well controlled with minimal use of PCA.  8/17 POD#2: No acute events overnight. Ambulated 68ft with PT. Pain well controlled.   DIAGNOSIS: (Principal diagnosis listed first) LUMBAR RADICULOPATHY [M54.16] LOW BACK PAIN [M54.5] SPINAL STENOSIS OF LUMBAR SPINE [M48.06] ACQUIRED SCOLIOSIS [M41.9] Active Problems: Post perative recovery  SURGICAL PROCEDURES: Procedure(s) (LRB): Left  lumbar 2-4 hemi-laminectomies, medial facetectomies and foraminotomies (Bilateral) PROCEDURES:LUMBAR LAMINECTOMY W DECOMPRESSION  CONSULTS:INPATIENT PHYSICAL THERAPY CONSULT INPATIENT OCCUPATIONAL THERAPY CONSULT INPATIENT DISCHARGE PLANNING CONSULT COMPLICATIONS: None   Pertinent Physical Exam findings: NEURO: GCS 15, A&Ox4 CN II-XII intact and symmetric, no focal deficit  MOTOR: RUE 5/5LUE 5/5 RLE 5/5LLE 5/5  SENSATION: intact to light touch No hyperreflexia No drift, ataxia, or dysmetria  INCISION: wound is clean, dry  CONDITION ON DISCHARGE: Vital signs are stable, the patient is ambulating independently without assistance, pain is controlled with oral medications, and bowel and bladder are working normally.   DISPOSITION: Home  PRIMARY CARE PROVIDER: Taur, Merlynn Shams (M.D.)  PAST HISTORY :  Past Medical History  Diagnosis Date  ? ASTHMA, PERSISTENT, CONTROLLED 06/04/2004  ? HYPERLIPIDEMIA 05/12/2003  ? TRANSIENT CEREBRAL ISCHEMIA 04/22/2003  ? SUPRAVENTRICULAR TACHYCARDIA, PAROXYSMAL 11/28/2007  ? HX OF ATRIOVENTRICULAR NODE ABLATION 02/01/2008    Done 01/09/08 Presently on no medicatons. Followed by Im.Xjwhjcjmp and Dr. Lynnette Manning [facility]   Past Surgical History  Procedure Laterality Date  ? Hernia repair inguinal    ? Cataract  surgery w iol-phaco  06/25/08    Procedure:CATARACT SURGERY W IOL-PHACO;  Surgeon:BAINER, LYNWOOD RUTH (M.D.); Location[facility]; Service:Ophthalmology  ? Cataract surgery w iol-phaco  07/25/08    Procedure:CATARACT SURGERY W IOL-PHACO; Surgeon:BAINER, LYNWOOD RUTH (M.D.); [facility]; Service:Ophthalmology  ? Transoral egd, flexible, diagnostic  10/11/2011  ? Transoral egd, flexible w bx  10/11/2011  ? Laminectomy lumbar w decompression Bilateral 01/12/2016    Procedure: Left  lumbar 2-4 hemi-laminectomies, medial facetectomies and foraminotomies;  Laterality: Bilateral;  Surgeon: Melanee Ripley BROCKS (M.D.), M.D.   ALLERGIES:  Adhesive Tape; Neosporin; Lovastatin.; Simvastatin; Amoxicillin Trihydrate; Aspirin; Ciprofloxacin-Dexamethasone ; Crestor ; Doxycycline Hyclate; Erythromycin Base; Hydrochlorothiazide; Ibuprofen; Keflex ; Omeprazole   PERTINENT LAB & IMAGING RESULTS: None   PENDING STUDY RESULTS AT DISCHARGE: None   DISCHARGE INSTRUCTIONS:  Patient Instructions: 1.  Take these medications as directed. 2.  If you have problems that may be caused by your medications such as rash, itching, swelling, or stomach pain, call your doctor. 3.  Do not take Blood Thinners including Aspirin, Coumadin / Warfarin, Plavix , NSAIDs including Motrin / Mobic  / Advil unless specifically told to do so by Neurosurgery MD / Practitioner  DISCHARGE MEDICATIONS: Current Discharge Medication List  STOP taking these medications  PROCTOZONE-HC 2.5 % Top Crea w/ Appl  Ciprofloxacin (CIPRO) 250 mg Oral Tab  Clindamycin HCl (CLEOCIN) 300 mg Oral Cap  PREGABALIN (LYRICA ORAL)  TRAMADOL  HCL (TRAMADOL  ORAL)  HYDROcodone -Acetaminophen  (NORCO) 5-325 mg Oral Tab  red yeast rice (CHOLESTEROL MANAGEMENT) 600 mg Oral Cap  START taking these medications  Docusate Sodium  (COLACE) 100 mg Oral Cap 2 capsule Oral 2 TIMES A DAY  Docusate Sodium  (COLACE) 250 mg Oral Cap 1 capsule Oral EVERY BEDTIME  Magnesium Hydroxide (MILK OF MAGNESIA) 400 mg/5 mL  Oral Susp 30 mL Oral DAILY AS NEEDED  Sennosides (SENOKOT) 8.6 mg Oral Tab 2 tablet Oral EVERY BEDTIME AS NEEDED  CONTINUE these medications which have CHANGED  HYDROcodone -Acetaminophen  (NORCO) 10-325 mg Oral Tab 1 tablet Oral EVERY 4 HOURS AS NEEDED for pain  CONTINUE these medications which have NOT CHANGED  Gabapentin  (NEURONTIN ) 300 mg Oral Cap Take 3 capsules by mouth daily at bedtime  Pantoprazole (PROTONIX) 40 mg Oral TBEC DR Tab Take 1 tablet by mouth daily 30 minutes before breakfast  Baclofen  (LIORESAL ) 10 mg Oral Tab Take 1 tablet by mouth 3 times a day  CARTIA  XT 120 mg Oral 24hr SR Cap Take 1 capsule by mouth daily  MULTIPLE VITAMIN-MINERALS ORAL TAB Reported on 01/01/2016  FOLLOW UP PROCEDURES (Tests): None  FOLLOW UP APPOINTMENTS: You will have appointments with: 1.  Neurosurgery nurse practitioner in 10-14 days. 2.  Dr Melanee, M.D. in 4-6 weeks. 3. Taur, Merlynn Shams (M.D.) in 1-2 weeks  4. Referral to outpatient PT to start in 4-6 weeks (2-3 x a week for 3-4 weeks): For core strengthening and lumbar stabilization exercises) ACTIVITY LEVEL: Up ad lib DIET: Regular Diet  PATIENT SPECIFIC INSTRUCTIONS: Keep the dressing clean and dry. No driving  for 4 weeks. No driving while on pain medication, no aggressive sports, no heavy lifting. Be sure to follow up with primary care physician along with neurosurgery clinics  Call [phone] for any new back pain, weakness, numbness, difficulty walking, fever, chills or bowel / bladder dysfunction or drainage, swelling worsening redness from incision or for any concerns __________________________________ Norleen Martin [facility] Division of Neurosurgery  [facility]: [place]  Pager [phone] Electronically signed by Melanee Ripley BROCKS (M.D.), M.D. at 01/14/2016  4:30 PM PDT  Associated attestation - Melanee Ripley BROCKS (M.D.), M.D. - 01/14/2016  4:30 PM PDT Formatting of this note might be different from the original. I agree with d/c  summary.  Electronically signed by: RIPLEY BROCKS RAO MD 01/14/2016 4:30 PM

## (undated) NOTE — Progress Notes (Signed)
 Formatting of this note is different from the original. RAPID RESPONSE LIAISON PROACTIVE ROUNDING NOTE Audrey Martinez 70 year old female, was identified by the rapid response liaison nurse for proactive rounds.  Arrival time: 1330  Primary Reason on Proactive Rounding List PCA PUMP  Situation PROACTIVE ROUNDS  Background PER MD NOTE Audrey Martinez is a 45 year old female with back pain, left sided leg pain and numbness in L3 and L4 with LEFT L2-3 and L3-4 lateral recess stenosis, LEFT L3-4 foraminal stenosis, and mild scoliosis with lateral listhesis Status Post: Left L2-4 hemilaminectomies and partial facetectomies, foraminotomies for lateral recess and foraminal decompression of L3 and L4 nerve roots. Assessment  Filed Vitals:   01/12/16 2353 01/13/16 0430 01/13/16 0800 01/13/16 1213  BP: 126/73 141/94 136/77 135/77  Pulse: 105 102 90 93  Temp: 97.7 F (36.5 C) 97 F (36.1 C) 98.6 F (37 C) 96.5 F (35.8 C)  Resp: 18 16 20 19   Height:      Weight:      SpO2: 95% 94% 98% 96%   Assessment findings:  Patient resting in bed no acute distress vss, verbalized understanding and use of PCa. Narcan in [facility]  Recommendations / Interventions Airway: None  Circulation: None  Other: None  Physician Notified?NA  Outcome No change in level of care   Thank you for your continued support of the Rapid Response Liaison. For questions call [phone], or dial [phone] to activate the Rapid Response Team  Electronically signed by: DARRILYN BILLEW DEMOUCHET RN 01/13/2016 2:07 PM   Electronically signed by Demouchet, Darrilyn Christella (R.N.), R.N. at 01/13/2016  2:08 PM PDT

## (undated) NOTE — Progress Notes (Signed)
 Formatting of this note might be different from the original. CASE MANAGEMENT INITIAL ASSESSMENT / DISCHARGE PLANNING PROGRESS NOTE  - TEAM : Neurosurgery  (Note: Italicized text is copied, though it may be edited to correct spelling, grammar, timing etc)  Chart reviewed  INITIAL ASSESSMENT Admission Source: elective Readmission: No Readmission Score (LACE)     Readmission Score (LACE) Total:      6    L-LENGTH OF STAY SCORE: 1   A-EMERGENT ADMISSION SCORE: 0   C-CHARLSON COMORBIDITY SCORE: 5   E-NUMBER OF ED VISITS SCORE: 0    PATIENT INFORMATIONA  Language: English  Need for interpreter:  no  Legal information: Advance Directive    Address: [address]  [place] [address]  Phone:    Home Phone     [phone]  Work Phone     [phone]  Mobile         Data Not Entered  TEXT OPT OUT   [phone]  Address / Phone confirmed: Yes  BENEFITS/FINANCIAL INFO   Insurance Coverage(s): [employer]  primary  DME Co-Pay:base/form Amount: 20%   SNF Co-Pay: $ no cost per Admission 1-100 days/100% private at 101+ days   Financial Issues: no issues identified    IM Letter Given: NA  MENTAL STATUS/ORIENTATION  Prior: Alert  x  4  Current: Alert  x 4  FUNCTIONAL STATUS  Prior Level of Function: Independent  Current Level of Function: Assistance with ambulation (please specify level): Assessed by PT-pending evaluation  Prior ADLS/IADLS Functioning: Independent  Current ADLS/IADLS Functioning: Assistance (Please specify): Nursing staff  Medication Management: Assistance (Please specify): Nursing staff        Prior DME: two  canes  And three  walkers: one walker without seat and two with seats, and shower chair    PSYCHOSOCIAL   Family/Social Support: family: adult son  Home Accessibility: Single Story  Living Arrangement: lives with (please specify name, address, phone): Family: adult son  DISCHARGE PLANNING  PLAN OF  CARE 01/13/2016  Acute Issues: lumbar radiculopathy  Low back pain spinal stenosis of lumbar spine acquired scoliosis/lumbar laminectomy w decompression/POD#1  Plan of Care: PT/OT/neuro checks/pain management/ABX   SHORT TERM PLAN:   Anticipated Discharge Plan:  home pending patient progress  Referral for: Case Management  Transportation upon discharge provided by: private vehicle; accompanied by Levi/son  Anticipated DME or supply needs: pending PT/OT evaluation/recommendation  Discharge needs that require follow up: pending PT/OT evaluation/recommendation  Potential Barriers: none anticipated   LONG VIEW OF CARE (prior and planned)  Nutrition Resources: patient cooks and grocery shopping   Exercise and Socialization Resources: independent with ADL's Caregiver Resources: sonTransportation Resources: Levi/son/cell#[phone] AD/POLST: Advance DirectiveResources (other resources): none needed at this time per patient  ADDITIONAL REFERRALS: NO   Referral to Social Medicine: NO   Referral to Financial Counselor:  NO   Referral to [place]/MediCal: NO CONCLUSION  01/13/2016     Patient/family/caregiver in agreement and aware of plans  Above plans communicated with primary RN/charge RN Alma/Brian and MD  Case manager to remain available for any emerging discharge planning needs/issues  THERISA IP [facility] RN Case Manager 01/13/2016 12:30 PM  Electronically signed by Charyl THERISA IP (R.N.), R.N. at 01/13/2016 12:55 PM PDT

## (undated) NOTE — Progress Notes (Signed)
 Formatting of this note is different from the original. Occupational Therapy Daily Note  SUBJECTIVE:      Referring diagnosis: decrease in activities of daily living due to LUMBAR RADICULOPATHY [M54.16]LOW BACK PAIN [M54.5] SPINAL STENOSIS OF LUMBAR SPINE [M48.06]ACQUIRED SCOLIOSIS [M41.9]; s/p Left lumbar 2-4 hemi-laminectomies, medial facetectomies and foraminotomies       Precautions: fall risk, spine precautions      Room#: 518      Visit #: 2        Patient agreeable to treatment upon arrival. Registered nurse aware. Current level of pain: 4/10 at rest, 4/10 with activity  OBJECTIVE  EATING: independent GROOMING/HYGIENE: stand by assistance/supervision UB DRESSING: minimal assistance (subject 75% or greater) LB DRESSING: moderate assistance (subject 50-74%) BED MOBILITY: minimal assistance (subject 75% or greater) TOILET T/F: minimal assistance (subject 75% or greater)           Patient seen for activities of daily living training x 15 minutes, therapeutic exercise x 15 minutes. Tolerated bilateral upper extremity active range of motion exercises 2x10 repetitions x 5 activities in sit for shoulders (flexion/ abduction), elbows (extension/ flexion), hands (flexion/ extension).   ASSESSMENT       Patient with fair endurance to tasks. Supine to edge of bed minimal assistance via log roll with good sitting balance noted. Dressing task in sit minimal assistance/to change gown, moderate assistance to don/ doff socks. Functional transfer to toilet minimal assistance with front-wheeled walker use although extended time needed. Minimal verbal and tactile cues provided for positioning, safety, body mechanics, pacing/ pursed lip breathing, with fair/ fair+ carryover noted.     PLAN: Next Visit       Continue treatment plan  Progress Toward Goals       Patient is minimal assistance with functional transfers  Disposition: call button in reach, in bed 3 rails with bed alarm, no apparent  distress, nursing staff notified, with family / friends and with staff  RECOMMENDATIONS  Continue Occupational Therapy, depending on patient progress  Total Treatment minutes: 35 Total Timed Code minutes: 30  GERALD B. ARAFOL, OTR/L 01/14/2016 Cisco#[id number] Electronically signed by Marvene Elna Hsu (O.T.), O.T. at 01/14/2016  3:47 PM PDT

## (undated) NOTE — OR PreOp (Signed)
 Formatting of this note is different from the original. PERIOPERATIVE ROUNDING NOTE  HPI: [name] low back pain with numbness in left L3 and 4 with feeling of her leg giving out on her causing fall. Low back pain with radiation to left leg. No weakness. No bowel/bladder incontinence. @TDPREOP @ PMH:  Past Medical History  Diagnosis Date  ? ASTHMA, PERSISTENT, CONTROLLED 06/04/2004  ? HYPERLIPIDEMIA 05/12/2003  ? TRANSIENT CEREBRAL ISCHEMIA 04/22/2003  ? SUPRAVENTRICULAR TACHYCARDIA, PAROXYSMAL 11/28/2007  ? HX OF ATRIOVENTRICULAR NODE ABLATION 02/01/2008    Done 01/09/08 Presently on no medicatons. Followed by Im.Xjwhjcjmp and Dr. Lynnette Manning [facility]   PSH:  Past Surgical History  Procedure Laterality Date  ? Hernia repair inguinal    ? Cataract surgery w iol-phaco  06/25/08    Procedure:CATARACT SURGERY W IOL-PHACO; Surgeon:BAINER, LYNWOOD RUTH (M.D.); [facility]; Service:Ophthalmology  ? Cataract surgery w iol-phaco  07/25/08    Procedure:CATARACT SURGERY W IOL-PHACO; Surgeon:BAINER, LYNWOOD RUTH (M.D.); [facility]; Service:Ophthalmology  ? Transoral egd, flexible, diagnostic  10/11/2011  ? Transoral egd, flexible w bx  10/11/2011   OBJECTIVE: AO x4, PERRLA, EOMI 5/5 BUE and BLE Sensation decreased to light touch in L3 and 4 distribution No clonus, DTR's 2+ throughout, no babbinksi ASSESSMENT:Audrey Martinez is a 69 year old female back and left-sided leg pain and numbness in L3 and L4 distribution secondary to LEFT L2-3 and L3-4 lateral recess stenosis, LEFT L3-4 foraminal stenosis, and mild scoliosis with lateral listhesis. Patient also has mild L4-5 lateral recess stenosis without significant neural compression  Podiatry PLAN: Left lumbar 2-4 hemi-laminectomies, possible adjacent levels, medial facetectomies and foraminotomies.  Jacob Bernstein DO, MS [facility] Neurosurgery Resident PGY III [facility] Neurosurgery, pg 2020  Electronically  signed by Melanee Ripley BROCKS (M.D.), M.D. at 01/12/2016  5:07 PM PDT  Associated attestation - Melanee Ripley BROCKS (M.D.), M.D. - 01/12/2016  5:07 PM PDT Formatting of this note might be different from the original. Patient seen in PACU preop - agree with resident exam/plan.  Electronically signed by: RIPLEY BROCKS RAO MD 01/12/2016 5:03 PM

## (undated) NOTE — OR PostOp (Signed)
 Formatting of this note is different from the original. POSTOPERATIVE ROUNDING NOTE  SUBJECTIVE: Audrey Martinez is a 30 year old female with back pain, left sided leg pain and numbness in L3 and L4 with LEFT L2-3 and L3-4 lateral recess stenosis, LEFT L3-4 foraminal stenosis, and mild scoliosis with lateral listhesis Status Post: Left L2-4 hemilaminectomies and partial facetectomies, foraminotomies for lateral recess and foraminal decompression of L3 and L4 nerve roots.  OBJECTIVE:  Filed Vitals:   01/12/16 1100 01/12/16 1746 01/12/16 1800  BP: 144/93 156/87   Pulse: 110 77   Temp: 98 F (36.7 C) 98.3 F (36.8 C) 98.3 F (36.8 C)  Resp: 16 16   Height: 1.702 m (5' 7.01)    Weight: 97.7 kg (215 lb 6.2 oz)    SpO2: 98% 99%     Exam:  PERRL 4 bilaterally EOMI  GCS 15 Following commands  MAE symmetrically 5/5 motor strength x 4 Sensation intact  ASSESSMENT:Audrey Martinez is a 76 year old female status post Left  lumbar 2-4 hemi-laminectomies, medial facetectomies and foraminotomies (Bilateral)  PLAN: - Admit to med surg - q4hr neuro checks - dilaudid PCA - PT/OT, ambulate - adv diet as tol - HLIV once tol diet -IS 10 x/hr. - Page Nsx on call (pager [phone]) with questions or change in exam  Lang Jaeger DO, MS Dept of Neurosurgery, PGY III [facility], pager [phone] 6:18 PM  01/12/2016  Electronically signed by Melanee Ripley BROCKS (M.D.), M.D. at 01/13/2016  7:17 AM PDT

## (undated) NOTE — Progress Notes (Signed)
 Formatting of this note might be different from the original. PHYSICAL THERAPY DAILY NOTE  Registered nurse cleared patient for physical therapy treatment.   SITUATION: Patient is agreeable to therapy and is likely to be discharged soon.  SUBJECTIVE:      Referring diagnosis: LUMBAR RADICULOPATHY [M54.16] LOW BACK PAIN [M54.5] SPINAL STENOSIS OF LUMBAR SPINE [M48.06] ACQUIRED SCOLIOSIS [M41.9]      Date of last initial evaluation: 01/13/16      Treatment number: 2      Current level of pain: 0/10      Precautions: spinal precautions  OBJECTIVE:  Functional Mobility:       Rolling: stand by assistance/supervision      Scooting:  stand by assistance/supervision           Supine to sitting: stand by assistance/supervision      Sitting to supine: stand by assistance/supervision      Sitting to standing: stand by assistance/supervision       Standing to sitting: stand by assistance/supervision       Transfer chair to bed: not tested      Transfer bed to chair: stand by assistance/supervision      Transfers toilet: not tested  Gait:       right weight bearing status: full weight bearing Device: front wheeled walker Level of assistance: stand by assistance/supervision Number of persons needed to assist: 1 (due to IV pole) Distance: 350 feet Gait deviations: velocity       Endurance: Fair  Balance:       Sitting balance, static: within normal limits      Sitting balance, dynamic: within normal limits      Standing balance, static: within normal limits      Standing balance, dynamic: within normal limits  Patient required stand by assist due to overall weakness, decreased strength/endurance, and gait safety. Patient required very few verbal cues and tactile cues for technique and safety. Physical therapist used a gait belt for all out of bed activities.   ASSESSMENT      Patient seemed very upbeat today for therapy. Patient demonstrated understanding of spinal precautions.   Patient successfully transferred to the edge of the bed with proper technique and stand by assist.  Patient did not complain of any dizziness and presented with less numbness and tingling under the right foot. Patient required stand by assist due to overall weakness, decreased strength/endurance, and gait safety. Patient stood with front-wheeled walker for about 12 minutes and presented with slight soreness in the back right hip; however, had no numbness and tingling under her right foot.  Also, patient performed standing exercises with front-wheeled walker and patient tolerated exercises well. Patient also had decreased right sided strength. MD and RN notified about patient's performance. Patient will benefit from continued physical therapy intervention based on above findings  Exercises:  Standing marching x 10, heel raises x 10 with front-wheeled walker  PLAN: Next Visit     Continue physical therapy plan of care.   RECOMMENDATIONS      Patient may benefit from continued aggressive skilled therapy with excellent potential to progress towards prior level of function. Durable medical equipment and disposition plans/needs expected to evolve based on patient progress and medical needs.   Disposition: alarm on, call button in reach, no apparent distress and with staff. Responsibility of patient care and safety returned to nursing staff, nursing agrees.  Total Treatment Minutes: 40 Total Timed Code Minutes: 40  ALEX JAMIL CASTRO PT 01/14/2016 Cisco#  [id number]   Electronically signed by Janell Marolyn Irani (P.T.), P.T. at 01/14/2016  3:39 PM PDT

## (undated) NOTE — OR Surgeon (Signed)
 Formatting of this note is different from the original. Neurosurgery Op Note 01/12/2016   Pre-op diagnosis: Left L3 and L4 radiculopathies due to L2/3 and L3/4  lateral recess  and L3/4 foraminal stenosis   Post-op diagnosis: same  Procedure: 1) Left L2-4 hemilaminectomies and partial facetectomies, foraminotomies for lateral recess and foraminal decompression of L3 and L4 nerve roots 2) Microdissection (use of operating microscope)  Surgeon: Audrey MOTE RAO MD   Assistant Resident Surgeon: Lang Jaeger DO PGY-3  Anesthesia: general endotracheal  Estimated blood loss: 200cc  Specimens: none  Indication for surgery:This is a 49 year old woman who apparently had a fall at work in 2011. Subsequent to that in 2000 while she started noticing pain and numbness down the LEFT leg up to the thigh and knee. Over the last couple years the symptoms have worsened especially the numbness in the LEFT knee and shin with pain that radiates from the hip and groin down to the knee. Symptoms are worse when she stands or walks and is improved by sitting down. She does tend to lean on the cart when she shops to help relieve the leg symptoms. She denies any RIGHT leg pain. Leg pain is worse than the back and she rates it as 8 on a scale of 1-10. She denies any bowel or bladder dysfunction. She feels that her walking tolerance is getting worse over time   MRI showed left lateral recess stenosis at L2/3 and  L3/L4.  Surgery was thus recommended. Procedure, benefits, risks and alternatives were explained and informed consent was obtained.  Intra-operative findings: Left L2/3 and L3/4 lateral recess and foraminal  stenosis  Operation in detail: The patient was brought to the operating room and the necessary preoperative briefing confirming patient identity, procedure being done, allergies of the patient, in the usual preoperative anesthesia and surgical checklist was performed. The patient was then placed  under general endotracheal anesthesia. Intravenous antibiotics were administered. Foley catheter was placed. The patient was positioned prone on the [place] bed with all pressure points being carefully padded.  The lower back was prepped and draped in the usual sterile fashion. A spinal needle was placed superficially in the lower lumbar region and a lateral x-ray was performed to identify the L2-4  levels. A small midline incision centered from L2-4 was made and dissection was carried hemostatically to the lumbodorsal fascia. The fascia was incised over the spinous processes and the paraspinal muscles were then elevated subperiosteally on the left side. The lamina and the medial aspect of the hypertrophied facet joints were exposed. A left sided unilateral McCullough retractor was placed. A small hook was placed underneath the lamina of L3 and another at L2 And a second x-ray intraoperatively was performed to identify that we were at the correct levels.  The operating microscope was brought into the field and further work was done under direct magnification.  On the left side, the inferior two-thirds of the L2 hemilamina, entire L3 hemilamina and superior L4 hemilamina were thinned in the gutters with a Midas 5 mm extra coarse diamond. The medial third of the hypertrophied L2/3   and L3/4 facet joints were also removed. Laminectomy was performed in the gutters using Kerrison punch. The hypertrophied ligamentum flavum was elevated and resected with Kerrison punches. Decompression continued laterally to the medial aspect of the pedicles. Care was taken to preserve at least 50% of the pars interarticularis at both levels bilaterally. The facets were undercut with a high-speed drill while protecting the dura  to unroof the bony subarticular and lateral recesses. The shoulder of the L3 and L4 spinal nerves were exposed on the left and decompressed inferiorly into the entrance of their respective neural  foramina. The medial L3 pedicle was noted to be jutting into the L3 nerve root and this hypertrophied bone had to be gently thinned with the drill and curettes to free the nerve root.     A Valsalva maneuver was then performed to confirm that there was no overt CSF leak. A small amount of FloSeal was placed in the epidural gutters for hemostasis. The wound was thoroughly irrigated and careful hemostasis was achieved prior to closure. A final x-ray with a blunt hook out in the direction of the left L3 and L4 nerve roots confirmed that they were free. 40mg  of Depomedrol was placed epidurally in the laminectomy defects. The deep paraspinal muscles and fascia layers were closed with 0 Polysorb sutures. Quarter percent Marcaine  was injected into the subcutaneous tissues. The subcutaneous tissue was closed with 2-0 Polysorb and then the skin was closed with a running 3-0 Caprosyn suture in a subcuticular fashion. This was further enforced with a layer of Dermaflex and sterile dressings were applied. The patient was turned back into the supine position, anesthesia was reversed and the patient was extubated and taken to the recovery room in stable fashion.  Please note that [employer] was used to dictate this report. There is the potential for phonetic inconsistency in how it is translated into text.   Electronically signed by: Audrey Martinez RAO MD 01/12/2016 5:15 PM  Electronically signed by Audrey Martinez (M.D.), M.D. at 01/12/2016  5:15 PM PDT

## (undated) NOTE — Progress Notes (Signed)
 Formatting of this note might be different from the original. Case Management Discharge Summary Note - Team Neurosurgery  Chart reviewed / case discussed with rounding MD.    Patient Address: [address] [place] [address] Phone: [phone] (home) [phone] (work) Address/Phone confirmed: Yes Transportation: private vehicle; accompanied by Levi/son/cell#[phone] Medicare IM Letter Issued On: NA   Gaffer of Care: Disposition / Placement: home with [facility] service and DME hospital bed Referral:  home health  PT: to see patient in two weeks to assess mobility and do home safety evaluation. DME verbal order by Dr. CANDIE Rao/John DeVere,PA for hospital bed. Placed order in [facility] and called DME [phone]); spoke with Jessica/Customer Service; order received and in process (20% co-pay and patient in agreement);Kimber will contact Santa (daughter-in-law/c#[phone]) for home delivery coordination for this evening. Patient has a  three walkers (one without seat and two with seat), two canes, and a shower chair at home.   Long View of Care:   Nutrition : Patient cooks and grocery shopping/Levi/son to assist  Exercise/Mobility : independent with ADL's note: Janis/daughter-in-law/cell#[phone] will assist.  Caregiver Support : daughter-in-law and son  Transportation: levi/son/cell#[phone]  Advanced Directive/POLST: Proofreader  Resources Given: none needed at this d/c date per patient  Patient/family in agreement and aware of plans Above plans communicated with primary RN/charge RN Alma/Brian and MD Case manager to remain available for any emerging discharge planning needs/issues.  Electronically signed by: THERISA HADASSAH CID RN Case Manager 01/14/2016 4:35 PM  Electronically signed by CID THERISA HADASSAH (R.N.), R.N. at 01/14/2016  4:44 PM PDT

---

## 1975-05-31 HISTORY — PX: BREAST CYST ASPIRATION: SHX578

## 1975-05-31 HISTORY — PX: OTHER SURGICAL HISTORY: SHX169

## 1996-05-30 DIAGNOSIS — I639 Cerebral infarction, unspecified: Secondary | ICD-10-CM

## 1996-05-30 HISTORY — DX: Cerebral infarction, unspecified: I63.9

## 2003-04-22 DIAGNOSIS — Z8673 Personal history of transient ischemic attack (TIA), and cerebral infarction without residual deficits: Secondary | ICD-10-CM | POA: Insufficient documentation

## 2003-04-22 HISTORY — DX: Personal history of transient ischemic attack (TIA), and cerebral infarction without residual deficits: Z86.73

## 2003-05-12 DIAGNOSIS — E785 Hyperlipidemia, unspecified: Secondary | ICD-10-CM | POA: Insufficient documentation

## 2003-05-12 DIAGNOSIS — E782 Mixed hyperlipidemia: Secondary | ICD-10-CM | POA: Insufficient documentation

## 2004-06-04 DIAGNOSIS — J4530 Mild persistent asthma, uncomplicated: Secondary | ICD-10-CM | POA: Insufficient documentation

## 2007-05-31 HISTORY — PX: CARDIAC ELECTROPHYSIOLOGY STUDY AND ABLATION: SHX1294

## 2007-05-31 HISTORY — PX: CARDIAC CATHETERIZATION: SHX172

## 2008-02-01 DIAGNOSIS — Z9889 Other specified postprocedural states: Secondary | ICD-10-CM | POA: Insufficient documentation

## 2008-05-30 HISTORY — PX: CATARACT EXTRACTION, BILATERAL: SHX1313

## 2010-05-30 HISTORY — PX: TOTAL SHOULDER REPLACEMENT: SUR1217

## 2011-05-31 HISTORY — PX: REPLACEMENT TOTAL KNEE: SUR1224

## 2011-05-31 HISTORY — PX: ESOPHAGOGASTRODUODENOSCOPY: SHX1529

## 2011-09-14 DIAGNOSIS — E669 Obesity, unspecified: Secondary | ICD-10-CM | POA: Insufficient documentation

## 2011-09-14 DIAGNOSIS — Z9889 Other specified postprocedural states: Secondary | ICD-10-CM | POA: Insufficient documentation

## 2013-12-10 DIAGNOSIS — M25551 Pain in right hip: Secondary | ICD-10-CM | POA: Insufficient documentation

## 2015-03-10 DIAGNOSIS — S82832A Other fracture of upper and lower end of left fibula, initial encounter for closed fracture: Secondary | ICD-10-CM | POA: Insufficient documentation

## 2015-05-31 HISTORY — PX: BACK SURGERY: SHX140

## 2015-05-31 HISTORY — PX: LUMBAR LAMINECTOMY: SHX95

## 2016-07-28 DIAGNOSIS — C4491 Basal cell carcinoma of skin, unspecified: Secondary | ICD-10-CM

## 2016-07-28 HISTORY — DX: Basal cell carcinoma of skin, unspecified: C44.91

## 2016-07-28 HISTORY — PX: MOHS SURGERY: SUR867

## 2017-03-18 ENCOUNTER — Encounter: Payer: Self-pay | Admitting: Internal Medicine

## 2017-03-21 ENCOUNTER — Ambulatory Visit (INDEPENDENT_AMBULATORY_CARE_PROVIDER_SITE_OTHER): Payer: 59 | Admitting: Internal Medicine

## 2017-03-21 ENCOUNTER — Encounter: Payer: Self-pay | Admitting: Internal Medicine

## 2017-03-21 VITALS — BP 130/84 | HR 80 | Ht 66.0 in | Wt 216.0 lb

## 2017-03-21 DIAGNOSIS — Z8249 Family history of ischemic heart disease and other diseases of the circulatory system: Secondary | ICD-10-CM | POA: Diagnosis not present

## 2017-03-21 DIAGNOSIS — J4520 Mild intermittent asthma, uncomplicated: Secondary | ICD-10-CM | POA: Diagnosis not present

## 2017-03-21 DIAGNOSIS — M810 Age-related osteoporosis without current pathological fracture: Secondary | ICD-10-CM

## 2017-03-21 DIAGNOSIS — M5137 Other intervertebral disc degeneration, lumbosacral region: Secondary | ICD-10-CM

## 2017-03-21 DIAGNOSIS — G250 Essential tremor: Secondary | ICD-10-CM

## 2017-03-21 MED ORDER — ALBUTEROL SULFATE HFA 108 (90 BASE) MCG/ACT IN AERS: 2.0000 | INHALATION_SPRAY | Freq: Four times a day (QID) | RESPIRATORY_TRACT | 0 refills | Status: DC | PRN

## 2017-03-21 MED ORDER — FLUTICASONE-SALMETEROL 100-50 MCG/DOSE IN AEPB: 1.0000 | INHALATION_SPRAY | Freq: Two times a day (BID) | RESPIRATORY_TRACT | 5 refills | Status: DC

## 2017-03-21 NOTE — Progress Notes (Signed)
Date:  03/21/2017   Name:  Audrey Martinez   DOB:  06-Feb-1946   MRN:  [id number]   Chief Complaint: [place] (From [place]- ) and Asthma (Wants to discuss asthma. Had asthma at younger ages, but now seems to be back since moving here. Started about 3 weeks ago. ) Asthma  She complains of chest tightness, cough and shortness of breath. This is a recurrent problem. The current episode started 1 to 4 weeks ago. The problem occurs daily. The problem has been rapidly worsening (since moving to [place]). The cough is non-productive. Pertinent negatives include no chest pain or fever. Her past medical history is significant for asthma.   Lumbar DDD - has leg numbness and low back pain.  Mostly left leg numbness and intermittent right leg sx.  Under went laminectomy rather than fusion L3-5 last year.  Still having leg numbness rather than weakness.  Donalda Ewings (M.D.), M.D. - 08/13/2015  1:37 PM PDT  CLINICAL HISTORY:   Reason: patient with  ongoing low back pain,       lower legs numbness/tingling, more lateral lower  legs,  please eval   for nerve impingement, disc herniation. Patient  interested in         surgical opinion.                                                       CONTRAST:                  None                                        COMPARISON:            01/10/2014                                        FINDINGS:                                                              No acute fracture. No bone destruction.  The conus is normal.          Moderate chronic compression at L3 body.                               T12-L1: Unremarkable .                                                 L1-2: Unremarkable .  L2-3:  Disc bulge. Facet arthropathy. Moderate bilateral foraminal     stenosis.                                                              L3-4: Disk bulge. Facet/ligament hypertrophy. Moderate central  canal    stenosis. Severe bilateral subarticular recess and left foraminal      stenosis.                                                              L4-5: Disc bulge. Facet arthropathy.                                   L5-S1:  Disc bulge. Facet arthropathy.                                  IMPRESSION:                                                            Multi-level degenerative changes described in body of report .         Moderate chronic compression at L3 body.                               No significant  change.                                                 This report electronically signed by Lolita Cram on  08/13/2015 1:32 PM   Fall at work 2011 - right arm injury and Right TKR as a result.  Had also total shoulder replacement and now has chronic arm problems from chronic dislocation. Takes Baclofen and gabapentin for pain, numbness and weakness.  SVT - s/p 2 ablations in 2010.  Now on diltiazem daily to help control rate from a pathway that can not be addressed.   Review of Systems  Constitutional: Negative for chills, fatigue and fever.  Eyes: Negative for visual disturbance.  Respiratory: Positive for cough and shortness of breath.   Cardiovascular: Negative for chest pain, palpitations and leg swelling.  Gastrointestinal: Negative for abdominal pain.  Genitourinary: Negative for dysuria.  Musculoskeletal: Positive for back pain.  Skin: Negative for wound.  Neurological: Positive for tremors. Negative for weakness and numbness.  Psychiatric/Behavioral: Negative for sleep disturbance.    Patient Active Problem List   Diagnosis Date Noted  . Osteoporosis 03/21/2017  . Obesity 09/14/2011  . Hyperlipidemia 05/12/2003  . Personal history of transient ischemic attack (TIA), and cerebral infarction without residual deficits 04/22/2003  Prior to Admission medications   Medication Sig Start Date End Date Taking? Authorizing Provider  baclofen (LIORESAL) 10 MG tablet Take 10  mg by mouth 3 (three) times daily.   Yes [provider]  calcium-vitamin D (OSCAL WITH D) 500-200 MG-UNIT tablet Take 1 tablet by mouth.   Yes [provider]  Cranberry 1000 MG CAPS Take by mouth.   Yes [provider]  diltiazem (CARTIA XT) 120 MG 24 hr capsule Take 120 mg by mouth daily.   Yes [provider]  gabapentin (NEURONTIN) 300 MG capsule Take 300 mg by mouth 3 (three) times daily. Three capsules daily at bedtime.   Yes [provider]  Ginger 500 MG CAPS Take by mouth.   Yes [provider]  Misc Natural Products (TART CHERRY ADVANCED PO) Take by mouth.   Yes [provider]  Multiple Vitamins-Minerals (MULTIVITAMIN WITH MINERALS) tablet Take 1 tablet by mouth daily.   Yes [provider]  vitamin E 100 UNIT capsule Take by mouth daily.   Yes [provider]    Allergies  Allergen Reactions  . Aspirin Anaphylaxis  . Ciprofloxacin-Dexamethasone Anaphylaxis  . Crestor [Rosuvastatin] Other (See Comments)    Caused problems with memory.  . Amoxicillin Hives  . Doxycycline Hyclate Hives  . Hydrochlorothiazide Other (See Comments)    Raises her blood pressure.   Marland Kitchen Keflex [Cephalexin] Hives  . Lovastatin   . Simvastatin   . Erythromycin Hives  . Ibuprofen Other (See Comments)    Sharp stomach pains  . Neosporin [Neomycin-Bacitracin Zn-Polymyx] Hives and Rash  . Omeprazole Palpitations    Tachycardia and same sensation as her SVTs per patient  . Tape Hives and Rash    Past Surgical History:  Procedure Laterality Date  . BACK SURGERY  2017   L3-5 laminectomy and foraminal stenosis  . CARDIAC ELECTROPHYSIOLOGY STUDY AND ABLATION  2009  . derrick procedure     removed parts of radius from both wrists  . lymph node removal     neck  . MOHS SURGERY  07/2016   Nose   . REPLACEMENT TOTAL KNEE  2013   RT  . TOTAL SHOULDER REPLACEMENT  2012   RT     Social History  Substance Use Topics  .  Smoking status: Never Smoker  . Smokeless tobacco: Never Used  . Alcohol use No     Medication list has been reviewed and updated.  PHQ 2/9 Scores 03/21/2017  PHQ - 2 Score 0    Physical Exam  Constitutional: She is oriented to person, place, and time. She appears well-developed. No distress.  HENT:  Head: Normocephalic and atraumatic.  Neck: Normal range of motion. Neck supple. No thyromegaly present.  Cardiovascular: Normal rate, regular rhythm and normal heart sounds.   No murmur heard. Pulmonary/Chest: Effort normal and breath sounds normal. No respiratory distress. She has no wheezes.  Musculoskeletal:       Right shoulder: She exhibits decreased range of motion, tenderness and decreased strength.       Lumbar back: She exhibits spasm. Lacerations: along left paraspinous muscles.       Back:  Neurological: She is alert and oriented to person, place, and time. She has normal strength. No cranial nerve deficit or sensory deficit.  Reflex Scores:      Bicep reflexes are 2+ on the right side and 2+ on the left side.      Patellar reflexes are 0 on the right side  and 2+ on the left side.      Achilles reflexes are 1+ on the right side and 1+ on the left side. Skin: Skin is warm and dry. No rash noted.  Psychiatric: She has a normal mood and affect. Her behavior is normal. Thought content normal.  Nursing note and vitals reviewed.   BP 130/84   Pulse 80   Ht 5\' 6"  (1.676 m)   Wt 216 lb (98 kg)   SpO2 97%   BMI 34.86 kg/m   Assessment and Plan: 1. Mild intermittent asthma without complication Likely worsened by change in local allergens - Fluticasone-Salmeterol (ADVAIR DISKUS) 100-50 MCG/DOSE AEPB; Inhale 1 puff into the lungs 2 (two) times daily.  Dispense: 60 each; Refill: 5 - albuterol (PROVENTIL HFA;VENTOLIN HFA) 108 (90 Base) MCG/ACT inhaler; Inhale 2 puffs into the lungs every 6 (six) hours as needed for wheezing or shortness of breath.  Dispense: 1 Inhaler; Refill:  0  2. Degeneration of lumbar/lumbosacral disc without myelopathy Still having LLE symptoms and was told that she may need fusion - Ambulatory referral to Neurosurgery  3. FHx: SVT (supraventricular tachycardia) On cardiazem - unsure if for rate or BP  4. Age related osteoporosis, unspecified pathological fracture presence Had DEXA 2016 - unsure about any treatment recommendations  5. Benign essential tremor   Meds ordered this encounter  Medications  . Fluticasone-Salmeterol (ADVAIR DISKUS) 100-50 MCG/DOSE AEPB    Sig: Inhale 1 puff into the lungs 2 (two) times daily.    Dispense:  60 each    Refill:  5  . albuterol (PROVENTIL HFA;VENTOLIN HFA) 108 (90 Base) MCG/ACT inhaler    Sig: Inhale 2 puffs into the lungs every 6 (six) hours as needed for wheezing or shortness of breath.    Dispense:  1 Inhaler    Refill:  0    Partially dictated using Editor, commissioning. Any errors are unintentional.  Halina Maidens, MD [facility]  03/21/2017

## 2017-04-03 ENCOUNTER — Other Ambulatory Visit: Payer: Self-pay | Admitting: Student

## 2017-04-03 DIAGNOSIS — R200 Anesthesia of skin: Secondary | ICD-10-CM

## 2017-04-10 ENCOUNTER — Ambulatory Visit
Admission: RE | Admit: 2017-04-10 | Discharge: 2017-04-10 | Disposition: A | Discharge status: Home / Self Care | Payer: 59 | Source: Ambulatory Visit | Attending: Student | Admitting: Student

## 2017-04-10 DIAGNOSIS — M4856XA Collapsed vertebra, not elsewhere classified, lumbar region, initial encounter for fracture: Secondary | ICD-10-CM | POA: Insufficient documentation

## 2017-04-10 DIAGNOSIS — R200 Anesthesia of skin: Secondary | ICD-10-CM | POA: Diagnosis not present

## 2017-04-10 DIAGNOSIS — M47816 Spondylosis without myelopathy or radiculopathy, lumbar region: Secondary | ICD-10-CM | POA: Insufficient documentation

## 2017-04-10 DIAGNOSIS — G8929 Other chronic pain: Secondary | ICD-10-CM | POA: Insufficient documentation

## 2017-04-10 DIAGNOSIS — M48061 Spinal stenosis, lumbar region without neurogenic claudication: Secondary | ICD-10-CM | POA: Diagnosis not present

## 2017-04-10 DIAGNOSIS — M5126 Other intervertebral disc displacement, lumbar region: Secondary | ICD-10-CM | POA: Insufficient documentation

## 2017-04-10 DIAGNOSIS — M8580 Other specified disorders of bone density and structure, unspecified site: Secondary | ICD-10-CM | POA: Insufficient documentation

## 2017-04-10 DIAGNOSIS — M545 Low back pain: Secondary | ICD-10-CM | POA: Diagnosis present

## 2017-04-14 ENCOUNTER — Ambulatory Visit: Payer: Self-pay | Admitting: Cardiovascular Disease

## 2017-04-14 ENCOUNTER — Encounter: Payer: Self-pay | Admitting: Cardiovascular Disease

## 2017-04-14 ENCOUNTER — Ambulatory Visit (INDEPENDENT_AMBULATORY_CARE_PROVIDER_SITE_OTHER): Payer: 59 | Admitting: Cardiovascular Disease

## 2017-04-14 VITALS — BP 132/85 | HR 93 | Ht 66.0 in | Wt 222.5 lb

## 2017-04-14 DIAGNOSIS — I471 Supraventricular tachycardia: Secondary | ICD-10-CM | POA: Diagnosis not present

## 2017-04-14 DIAGNOSIS — J45909 Unspecified asthma, uncomplicated: Secondary | ICD-10-CM

## 2017-04-14 DIAGNOSIS — I10 Essential (primary) hypertension: Secondary | ICD-10-CM | POA: Diagnosis not present

## 2017-04-14 DIAGNOSIS — E782 Mixed hyperlipidemia: Secondary | ICD-10-CM | POA: Diagnosis not present

## 2017-04-14 DIAGNOSIS — J4540 Moderate persistent asthma, uncomplicated: Secondary | ICD-10-CM

## 2017-04-14 DIAGNOSIS — Z8673 Personal history of transient ischemic attack (TIA), and cerebral infarction without residual deficits: Secondary | ICD-10-CM

## 2017-04-14 DIAGNOSIS — J4520 Mild intermittent asthma, uncomplicated: Secondary | ICD-10-CM | POA: Insufficient documentation

## 2017-04-14 MED ORDER — DILTIAZEM HCL 60 MG PO TABS: 30.0000 mg | ORAL_TABLET | Freq: Three times a day (TID) | ORAL | 6 refills | Status: DC | PRN

## 2017-04-14 MED ORDER — DILTIAZEM HCL ER COATED BEADS 180 MG PO CP24: 180.0000 mg | ORAL_CAPSULE | Freq: Every day | ORAL | 4 refills | Status: DC

## 2017-04-14 NOTE — Patient Instructions (Addendum)
Medication Instructions:   Please increase the diltiazem XR up to 180 mg daily  Please take diltiazem 60  as needed for tachycardia  Labwork:   No new labs needed  Testing/Procedures:  No further testing at this time   Follow-Up: It was a pleasure seeing you in the office today. Please call us if you have new issues that need to be addressed before your next appt.  [phone]  Your physician wants you to follow-up in: 6 months.  You will receive a reminder letter in the mail two months in advance. If you don't receive a letter, please call our office to schedule the follow-up appointment.  If you need a refill on your cardiac medications before your next appointment, please call your pharmacy.

## 2017-04-14 NOTE — Progress Notes (Signed)
Cardiology Office Note  Date:  04/14/2017   ID:  Audrey, Martinez 11/26/1945, MRN [id number]  PCP:  Glean Hess, MD   Chief Complaint  Patient presents with  . Other    Self ref to establish care for SVT; had two cardiac ablations in [place]. Meds reviewed by the pt. verbally.      HPI:  Audrey Martinez is a 71 year old woman with history of SVT, atrial tachycardia Ablation x 2 PNA as a child, chronic lung disease, chronic cough/asthma.  Non-smoker  Who presents by self referral for history of atrial tachycardia, SVT  Notes from [place] cardiologist reviewed in detail with her Notes indicate 1. Dual AV nodal physiology. 2. Typical slow fast AV nodal reentrant tachycardia. 3. Nonsustained AT / No inducible atrial flutter  She underwent s/p Successful radiofrequency ablation of AV nodal slow pathway.   She had recurrent symptoms shortly after, had event monitor  repeat EPS 01/21/08:  1. Markedly prolonged baseline AH interval (AV nodal function is subnormal)  2. No induceable tachycardia; Only induceable double echo beats As per EP,  event monitor with ectopic atrial tachycardia versus atrial flutter with 2:1 block.   Notes indicating she did not want anticoagulation  PRIOR CARDIAC STUDIES: -ECHOCARDIOGRAM 12/13/06: LVEF 55-60%. No significant valvular abnormalities. Noted to have episodes of atrial flutter at controlled rate. -ETT 09/28/07: Bruce protocol 5:01 minutes. 7 METS. Maximal HR 179 bpm (111% MPHR). Pretest ECG normal with HR 96 bpm. No chest pain. No significant ischemia.  -STRESS ECHO 11/14/08: Bruce protocol 4:31 minutes. 4.6 METS. 113% MPHR. LVEF 60%. No significant ischemia.  No regular exercise program Recent chest congestion, coughing On several inhalers Denies any chest pain concerning for angina Reports having periodic tachycardia, not frequently Blood pressure mildly elevated at baseline  [facility] personally reviewed by myself on todays  visit Normal sinus rhythm rate 93 bpm no significant ST or T wave changes  PAST MEDICAL/SURGICAL HISTORY: 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 -Hypertension -Hyperlipidemia -History of TIA -Asthma -History of migraines -Right cataract and history of right retinal bleeding over a year ago, now resolved -S/P umbilical hernia repair -S/P tonsillectomy -S/P multiple wrist surgeries -S/P excision of right supraclavicular neck mass (lymph node) -S/P cataract surgery   PMH:   has a past medical history of Asthma, Herniated disc, cervical, Left wrist fracture, Stroke ([place]) (1998), and SVT (supraventricular tachycardia) ([place]).  PSH:    Past Surgical History:  Procedure Laterality Date  . BACK SURGERY  2017   L2-4 laminectomy and foraminal stenosis  . CARDIAC CATHETERIZATION  2009   [facility] in [place]  . CARDIAC ELECTROPHYSIOLOGY STUDY AND ABLATION  2009  . CATARACT EXTRACTION, BILATERAL  2010  . derrick procedure     removed parts of radius from both wrists  . ESOPHAGOGASTRODUODENOSCOPY  2013   gastritis; done for complaint of dysphagia  . lymph node removal     neck  . MOHS SURGERY  07/2016   BCCA nose  . REPLACEMENT TOTAL KNEE  2013   RT  . TOTAL SHOULDER REPLACEMENT  2012   RT     Current Outpatient Medications  Medication Sig Dispense Refill  . albuterol (PROVENTIL HFA;VENTOLIN HFA) 108 (90 Base) MCG/ACT inhaler Inhale 2 puffs into the lungs every 6 (six) hours as needed for wheezing or shortness of breath. 1 Inhaler 0  . baclofen (LIORESAL) 10 MG tablet Take 10 mg by mouth 3 (three) times daily.    . calcium-vitamin  D (OSCAL WITH D) 500-200 MG-UNIT tablet Take 1 tablet by mouth.    . Cranberry 1000 MG CAPS Take by mouth.    . diltiazem (CARDIZEM CD) 180 MG 24 hr capsule Take 1 capsule (180 mg total) daily by mouth. 90 capsule 4  . Fluticasone-Salmeterol (ADVAIR DISKUS) 100-50 MCG/DOSE AEPB Inhale 1 puff into the lungs 2 (two) times  daily. 60 each 5  . gabapentin (NEURONTIN) 300 MG capsule Take 300 mg by mouth 3 (three) times daily. Three capsules daily at bedtime.    . Ginger 500 MG CAPS Take by mouth.    . Misc Natural Products (TART CHERRY ADVANCED PO) Take by mouth.    . Multiple Vitamins-Minerals (MULTIVITAMIN WITH MINERALS) tablet Take 1 tablet by mouth daily.    . vitamin E 100 UNIT capsule Take by mouth daily.    Marland Kitchen diltiazem (CARDIZEM) 60 MG tablet Take 0.5 tablets (30 mg total) 3 (three) times daily as needed by mouth. 90 tablet 6   No current facility-administered medications for this visit.      Allergies:   Aspirin; Ciprofloxacin-dexamethasone; Crestor [rosuvastatin]; Amoxicillin; Doxycycline hyclate; Hydrochlorothiazide; Keflex [cephalexin]; Lovastatin; Simvastatin; Erythromycin; Ibuprofen; Neomycin-bacitracin zn-polymyx; Omeprazole; Other; and Tape   Social History:  The patient  reports that  has never smoked. she has never used smokeless tobacco. She reports that she does not drink alcohol or use drugs.   Family History:   family history includes Diabetes in her mother; Stroke in her mother; Valvular heart disease in her father.    Review of Systems: Review of Systems  Constitutional: Negative.   Respiratory: Negative.   Cardiovascular: Positive for palpitations.       Tachycardia  Gastrointestinal: Negative.   Musculoskeletal: Negative.   Neurological: Negative.   Psychiatric/Behavioral: Negative.   All other systems reviewed and are negative.    PHYSICAL EXAM: VS:  BP 132/85 (BP Location: Left Arm, Patient Position: Sitting, Cuff Size: Large)   Pulse 93   Ht 5\' 6"  (1.676 m)   Wt 222 lb 8 oz (100.9 kg)   BMI 35.91 kg/m  , BMI Body mass index is 35.91 kg/m. GEN: Well nourished, well developed, in no acute distress  HEENT: normal  Neck: no JVD, carotid bruits, or masses Cardiac: RRR; no murmurs, rubs, or gallops,no edema  Respiratory:  clear to auscultation bilaterally, normal work of  breathing GI: soft, nontender, nondistended, + BS MS: no deformity or atrophy  Skin: warm and dry, no rash Neuro:  Strength and sensation are intact Psych: euthymic mood, full affect    Recent Labs: No results found for requested labs within last 8760 hours.    Lipid Panel No results found for: CHOL, HDL, LDLCALC, TRIG    Wt Readings from Last 3 Encounters:  04/14/17 222 lb 8 oz (100.9 kg)  03/21/17 216 lb (98 kg)       ASSESSMENT AND PLAN:  SVT (supraventricular tachycardia) ([place]) - Plan: EKG 12-Lead Prior ablation denies having any SVT,   Atrial tachycardia ([place]) - Plan: EKG 12-Lead By her history, sounds as if she is having episodes of atrial tachycardia Infrequent but still present Unable to exclude other arrhythmia such as atrial flutter Recommended she increase diltiazem up to 180 mg daily and call our office if she has recurrent symptoms, we would order event monitor  Mixed hyperlipidemia - Plan: EKG 12-Lead She does not want a statin  Personal history of transient ischemic attack (TIA), and cerebral infarction without residual deficits Does not want  aspirin, Plavix or warfarin Discussed risk of stroke if she is having frequent episodes of tachycardia Suggested she call us if episodes increase, we would order event monitor to exclude atrial flutter  Hypertension Recommended she increase her diltiazem standard release up to 180 mg daily Monitor blood pressure at home  Reactive airway disease/asthma On inhalers, Long discussion with her, we have made referral to pulmonary  Disposition:   F/U  6 months   Total encounter time more than 60 minutes  Greater than 50% was spent in counseling and coordination of care with the patient    Orders Placed This Encounter  Procedures  . EKG 12-Lead     Signed, Esmond Plants, M.D., Ph.D. 04/14/2017  [place], [place]

## 2017-04-24 ENCOUNTER — Encounter: Payer: Self-pay | Admitting: Internal Medicine

## 2017-04-24 ENCOUNTER — Ambulatory Visit (INDEPENDENT_AMBULATORY_CARE_PROVIDER_SITE_OTHER): Payer: 59 | Admitting: Internal Medicine

## 2017-04-24 VITALS — BP 124/68 | HR 108 | Ht 66.0 in | Wt 223.0 lb

## 2017-04-24 DIAGNOSIS — I471 Supraventricular tachycardia: Secondary | ICD-10-CM | POA: Diagnosis not present

## 2017-04-24 DIAGNOSIS — J4540 Moderate persistent asthma, uncomplicated: Secondary | ICD-10-CM

## 2017-04-24 NOTE — Progress Notes (Signed)
Date:  04/24/2017   Name:  Audrey Martinez   DOB:  [birth year]   MRN:  [id number]   Chief Complaint: Asthma (Doing better but advair inhaler does not help. Seen Cardiologist in [place] and he set him with pulmonologist. Has appt tomorrow.  ) SHe is using Advair without much improvement.  Not using albuterol due to elevated heart rate. Not waking up at night.  No sputum production.  Tachycardia - seen by Cardiology.  ON Cardiazem daily and can take extra as needed. Doing well currently.  Review of Systems  Constitutional: Negative for chills and fatigue.  Respiratory: Positive for cough, chest tightness and shortness of breath. Negative for wheezing.   Cardiovascular: Negative for chest pain and palpitations.    Patient Active Problem List   Diagnosis Date Noted  . Atrial tachycardia ([place]) 04/14/2017  . SVT (supraventricular tachycardia) ([place]) 04/14/2017  . Essential hypertension 04/14/2017  . Reactive airway disease 04/14/2017  . Osteoporosis 03/21/2017  . Benign essential tremor 03/21/2017  . Obesity 09/14/2011  . Hyperlipidemia 05/12/2003  . Personal history of transient ischemic attack (TIA), and cerebral infarction without residual deficits 04/22/2003    Prior to Admission medications   Medication Sig Start Date End Date Taking? Authorizing Provider  albuterol (PROVENTIL HFA;VENTOLIN HFA) 108 (90 Base) MCG/ACT inhaler Inhale 2 puffs into the lungs every 6 (six) hours as needed for wheezing or shortness of breath. 03/21/17  Yes Glean Hess, MD  baclofen (LIORESAL) 10 MG tablet Take 10 mg by mouth 3 (three) times daily.   Yes [provider]  calcium-vitamin D (OSCAL WITH D) 500-200 MG-UNIT tablet Take 1 tablet by mouth.   Yes [provider]  Cranberry 1000 MG CAPS Take by mouth.   Yes [provider]  diltiazem (CARDIZEM CD) 180 MG 24 hr capsule Take 1 capsule (180 mg total) daily by mouth. 04/14/17  Yes Gollan, Kathlene November, MD    diltiazem (CARDIZEM) 60 MG tablet Take 60 mg by mouth 4 (four) times daily.   Yes [provider]  Fluticasone-Salmeterol (ADVAIR DISKUS) 100-50 MCG/DOSE AEPB Inhale 1 puff into the lungs 2 (two) times daily. 03/21/17  Yes Glean Hess, MD  gabapentin (NEURONTIN) 300 MG capsule Take 300 mg by mouth 3 (three) times daily. Three capsules daily at bedtime.   Yes [provider]  Ginger 500 MG CAPS Take by mouth.   Yes [provider]  Misc Natural Products (TART CHERRY ADVANCED PO) Take by mouth.   Yes [provider]  Multiple Vitamins-Minerals (MULTIVITAMIN WITH MINERALS) tablet Take 1 tablet by mouth daily.   Yes [provider]  Turmeric 500 MG CAPS Take by mouth.   Yes [provider]  vitamin E 100 UNIT capsule Take by mouth daily.   Yes [provider]    Allergies  Allergen Reactions  . Aspirin Anaphylaxis  . Ciprofloxacin-Dexamethasone Anaphylaxis  . Crestor [Rosuvastatin] Other (See Comments)    Caused problems with memory.  . Amoxicillin Hives  . Doxycycline Hyclate Hives  . Hydrochlorothiazide Other (See Comments)    Raises her blood pressure.   Marland Kitchen Keflex [Cephalexin] Hives  . Lovastatin Other (See Comments)    "Unable to function"  . Simvastatin Other (See Comments)    "Unable to function"  . Erythromycin Hives  . Ibuprofen Other (See Comments)    Sharp stomach pains  . Neomycin-Bacitracin Zn-Polymyx Hives and Rash  . Omeprazole Palpitations    Tachycardia and same sensation as  her SVTs per patient  . Other Hives and Rash  . Tape Hives and Rash    Past Surgical History:  Procedure Laterality Date  . BACK SURGERY  2017   L2-4 laminectomy and foraminal stenosis  . CARDIAC CATHETERIZATION  2009   [facility] in [place]  . CARDIAC ELECTROPHYSIOLOGY STUDY AND ABLATION  2009  . CATARACT EXTRACTION, BILATERAL  2010  . derrick procedure     removed parts of radius from both wrists   . ESOPHAGOGASTRODUODENOSCOPY  2013   gastritis; done for complaint of dysphagia  . lymph node removal     neck  . MOHS SURGERY  07/2016   BCCA nose  . REPLACEMENT TOTAL KNEE  2013   RT  . TOTAL SHOULDER REPLACEMENT  2012   RT     Social History   Tobacco Use  . Smoking status: Never Smoker  . Smokeless tobacco: Never Used  Substance Use Topics  . Alcohol use: No  . Drug use: No     Medication list has been reviewed and updated.  PHQ 2/9 Scores 03/21/2017  PHQ - 2 Score 0    Physical Exam  Constitutional: She is oriented to person, place, and time. She appears well-developed. No distress.  HENT:  Head: Normocephalic and atraumatic.  Cardiovascular: Normal rate, regular rhythm and normal heart sounds.  Pulmonary/Chest: Effort normal and breath sounds normal. No respiratory distress.  Musculoskeletal: Normal range of motion.  Neurological: She is alert and oriented to person, place, and time.  Skin: Skin is warm and dry. No rash noted.  Psychiatric: She has a normal mood and affect. Her behavior is normal. Thought content normal.  Nursing note and vitals reviewed.   BP 124/68   Pulse (!) 108   Ht 5\' 6"  (1.676 m)   Wt 223 lb (101.2 kg)   SpO2 95%   BMI 35.99 kg/m   Assessment and Plan: 1. Moderate persistent reactive airway disease without complication Continue Advair - see Pulmonary next week Consider Xoepenex for PRN use  2. Atrial tachycardia (Audrey Martinez) Seen by cardiology Now on Cardiazem daily with additional dosing PRN for tachycardia   No orders of the defined types were placed in this encounter.   Partially dictated using Editor, commissioning. Any errors are unintentional.  Halina Maidens, MD [facility]  04/24/2017

## 2017-04-25 ENCOUNTER — Encounter: Payer: Self-pay | Admitting: Internal Medicine

## 2017-04-25 ENCOUNTER — Ambulatory Visit (INDEPENDENT_AMBULATORY_CARE_PROVIDER_SITE_OTHER): Payer: 59 | Admitting: Internal Medicine

## 2017-04-25 VITALS — BP 146/100 | HR 119 | Ht 66.0 in | Wt 224.0 lb

## 2017-04-25 DIAGNOSIS — J4520 Mild intermittent asthma, uncomplicated: Secondary | ICD-10-CM | POA: Diagnosis not present

## 2017-04-25 MED ORDER — FLUTICASONE PROPIONATE HFA 110 MCG/ACT IN AERO: 2.0000 | INHALATION_SPRAY | Freq: Two times a day (BID) | RESPIRATORY_TRACT | 2 refills | Status: DC

## 2017-04-25 MED ORDER — SALMETEROL XINAFOATE 50 MCG/DOSE IN AEPB: 1.0000 | INHALATION_SPRAY | Freq: Two times a day (BID) | RESPIRATORY_TRACT | 2 refills | Status: DC

## 2017-04-25 NOTE — Patient Instructions (Signed)
Start Flovent use as directed Start Serevent and use as directed    Asthma, Adult Asthma is a condition of the lungs in which the airways tighten and narrow. Asthma can make it hard to breathe. Asthma cannot be cured, but medicine and lifestyle changes can help control it. Asthma may be started (triggered) by:  Animal skin flakes (dander).  Dust.  Cockroaches.  Pollen.  Mold.  Smoke.  Cleaning products.  Hair sprays or aerosol sprays.  Paint fumes or strong smells.  Cold air, weather changes, and winds.  Crying or laughing hard.  Stress.  Certain medicines or drugs.  Foods, such as dried fruit, potato chips, and sparkling grape juice.  Infections or conditions (colds, flu).  Exercise.  Certain medical conditions or diseases.  Exercise or tiring activities.  Follow these instructions at home:  Take medicine as told by your doctor.  Use a peak flow meter as told by your doctor. A peak flow meter is a tool that measures how well the lungs are working.  Record and keep track of the peak flow meter's readings.  Understand and use the asthma action plan. An asthma action plan is a written plan for taking care of your asthma and treating your attacks.  To help prevent asthma attacks: ? Do not smoke. Stay away from secondhand smoke. ? Change your heating and air conditioning filter often. ? Limit your use of fireplaces and wood stoves. ? Get rid of pests (such as roaches and mice) and their droppings. ? Throw away plants if you see mold on them. ? Clean your floors. Dust regularly. Use cleaning products that do not smell. ? Have someone vacuum when you are not home. Use a vacuum cleaner with a HEPA filter if possible. ? Replace carpet with wood, tile, or vinyl flooring. Carpet can trap animal skin flakes and dust. ? Use allergy-proof pillows, mattress covers, and box spring covers. ? Wash bed sheets and blankets every week in hot water and dry them in a  dryer. ? Use blankets that are made of polyester or cotton. ? Clean bathrooms and kitchens with bleach. If possible, have someone repaint the walls in these rooms with mold-resistant paint. Keep out of the rooms that are being cleaned and painted. ? Wash hands often. Contact a doctor if:  You have make a whistling sound when breaking (wheeze), have shortness of breath, or have a cough even if taking medicine to prevent attacks.  The colored mucus you cough up (sputum) is thicker than usual.  The colored mucus you cough up changes from clear or white to yellow, green, gray, or bloody.  You have problems from the medicine you are taking such as: ? A rash. ? Itching. ? Swelling. ? Trouble breathing.  You need reliever medicines more than 2-3 times a week.  Your peak flow measurement is still at 50-79% of your personal best after following the action plan for 1 hour.  You have a fever. Get help right away if:  You seem to be worse and are not responding to medicine during an asthma attack.  You are short of breath even at rest.  You get short of breath when doing very little activity.  You have trouble eating, drinking, or talking.  You have chest pain.  You have a fast heartbeat.  Your lips or fingernails start to turn blue.  You are light-headed, dizzy, or faint.  Your peak flow is less than 50% of your personal best. This information is  not intended to replace advice given to you by your health care provider. Make sure you discuss any questions you have with your health care provider. Document Released: 11/02/2007 Document Revised: 10/22/2015 Document Reviewed: 12/13/2012 Elsevier Interactive Patient Education  2017 [employer].

## 2017-04-25 NOTE — Progress Notes (Signed)
Name: Audrey Martinez MRN: [id number] DOB: 1946-03-18     CONSULTATION DATE:04/25/2017  REFERRING MD :  Dr Rockey Situ  CHIEF COMPLAINT:SOB  STUDIES:  NO CXR at this time   HISTORY OF PRESENT ILLNESS:  71 year old white female seen today for assessment for her underlying asthma Patient has a history of SVT and was referred to [place] by Dr. 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  Patient has had a chronic cough daily for the past several years Cold air and moisture aggravate her cough Strong perfumes and smoke also aggravate her cough  Patient has chronic nasal congestion she has tried Zyrtec and Claritin and did not work she has also tried Designer, industrial/product and did not work  Patient was started on [employer] and Serevent and this has helped her symptoms in the past but some reason she was switched over to Advair and the Advair has not helped her at all  Patient does not take any aspirin due to the fact that she states that she has an aspirin sensitivity that triggers her asthma  At this time there is no signs of infection at this time No signs of acute asthma exacerbation at this time She has no signs of acute heart failure at this time  PAST MEDICAL HISTORY :   has a past medical history of Asthma, Herniated disc, cervical, Left wrist fracture, Stroke ([place]) (1998), and SVT (supraventricular tachycardia) ([place]).  has a past surgical history that includes Cardiac  electrophysiology study and ablation (2009); Total shoulder replacement (2012); Replacement total knee (2013); Back surgery (2017); lymph node removal; Mohs surgery (07/2016); derrick procedure; Cataract extraction, bilateral (2010); Esophagogastroduodenoscopy (2013); and Cardiac catheterization (2009). Prior to Admission medications   Medication Sig Start Date End Date Taking? Authorizing Provider  albuterol (PROVENTIL HFA;VENTOLIN HFA) 108 (90 Base) MCG/ACT inhaler Inhale 2 puffs into the lungs every 6 (six) hours as needed for wheezing or shortness of breath. 03/21/17  Yes Glean Hess, MD  baclofen (LIORESAL) 10 MG tablet Take 10 mg by mouth 3 (three) times daily.   Yes [provider]  calcium-vitamin D (OSCAL WITH D) 500-200 MG-UNIT tablet Take 1 tablet by mouth.   Yes [provider]  Cranberry 1000 MG CAPS Take by mouth.   Yes [provider]  diltiazem (CARDIZEM CD) 180 MG 24 hr capsule Take 1 capsule (180 mg total) daily by mouth. 04/14/17  Yes Gollan, Kathlene November, MD  diltiazem (CARDIZEM) 60 MG tablet Take 60 mg by mouth 4 (four) times daily.   Yes [provider]  Fluticasone-Salmeterol (ADVAIR DISKUS) 100-50 MCG/DOSE AEPB Inhale 1 puff into the lungs 2 (two) times daily. 03/21/17  Yes Glean Hess, MD  gabapentin (NEURONTIN) 300 MG capsule Take 300 mg by mouth 3 (three) times daily. Three capsules daily at bedtime.   Yes [provider]  Ginger 500 MG CAPS Take by mouth.   Yes [provider]  Misc Natural Products (TART  CHERRY ADVANCED PO) Take by mouth.   Yes [provider]  Multiple Vitamins-Minerals (MULTIVITAMIN WITH MINERALS) tablet Take 1 tablet by mouth daily.   Yes [provider]  Turmeric 500 MG CAPS Take by mouth.   Yes [provider]  vitamin E 100 UNIT capsule Take by mouth daily.   Yes [provider]   Allergies  Allergen Reactions  . Aspirin Anaphylaxis  .  Ciprofloxacin-Dexamethasone Anaphylaxis  . Crestor [Rosuvastatin] Other (See Comments)    Caused problems with memory.  . Amoxicillin Hives  . Doxycycline Hyclate Hives  . Hydrochlorothiazide Other (See Comments)    Raises her blood pressure.   Marland Kitchen Keflex [Cephalexin] Hives  . Lovastatin Other (See Comments)    "Unable to function"  . Simvastatin Other (See Comments)    "Unable to function"  . Erythromycin Hives  . Ibuprofen Other (See Comments)    Sharp stomach pains  . Neomycin-Bacitracin Zn-Polymyx Hives and Rash  . Omeprazole Palpitations    Tachycardia and same sensation as her SVTs per patient  . Other Hives and Rash  . Tape Hives and Rash    FAMILY HISTORY:  family history includes Diabetes in her mother; Stroke in her mother; Valvular heart disease in her father. SOCIAL HISTORY:  reports that  has never smoked. she has never used smokeless tobacco. She reports that she does not drink alcohol or use drugs.  REVIEW OF SYSTEMS:   Constitutional: Negative for fever, chills, weight loss, malaise/fatigue and diaphoresis.  HENT: Negative for hearing loss, ear pain, nosebleeds, congestion, sore throat, neck pain, tinnitus and ear discharge.   Eyes: Negative for blurred vision, double vision, photophobia, pain, discharge and redness.  Respiratory: +cough, hemoptysis, sputum production, shortness of breath, wheezing and stridor.   Cardiovascular: Negative for chest pain, palpitations, orthopnea, claudication, leg swelling and PND.  Gastrointestinal: Negative for heartburn, nausea, vomiting, abdominal pain, diarrhea, constipation, blood in stool and melena.  Genitourinary: Negative for dysuria, urgency, frequency, hematuria and flank pain.  Musculoskeletal: Negative for myalgias, back pain, joint pain and falls.  Skin: Negative for itching and rash.  Neurological: Negative for dizziness, tingling, tremors, sensory change, speech change, focal weakness, seizures, loss of consciousness,  weakness and headaches.  Endo/Heme/Allergies: Negative for environmental allergies and polydipsia. Does not bruise/bleed easily.  ALL OTHER ROS ARE NEGATIVE    VITAL SIGNS: BP (!) 146/100 (BP Location: Left Arm, Cuff Size: Normal)   Pulse (!) 119   Ht 5\' 6"  (1.676 m)   Wt 224 lb (101.6 kg)   SpO2 93%   BMI 36.15 kg/m    Physical Examination:   GENERAL:NAD, no fevers, chills, no weakness no fatigue HEAD: Normocephalic, atraumatic.  EYES: Pupils equal, round, reactive to light. Extraocular muscles intact. No scleral icterus.  MOUTH: Moist mucosal membrane.   EAR, NOSE, THROAT: Clear without exudates. No external lesions.  NECK: Supple. No thyromegaly. No nodules. No JVD.  PULMONARY:CTA B/L no wheezes, no crackles, no rhonchi CARDIOVASCULAR: S1 and S2. Regular rate and rhythm. No murmurs, rubs, or gallops. No edema.  GASTROINTESTINAL: Soft, nontender, nondistended. No masses. Positive bowel sounds.  MUSCULOSKELETAL: No swelling, clubbing, or edema. Range of motion full in all extremities.  NEUROLOGIC: Cranial nerves II through XII are intact. No gross focal neurological deficits.  SKIN: No ulceration, lesions, rashes, or cyanosis. Skin warm and dry. Turgor intact.  PSYCHIATRIC: Mood, affect within normal limits. The patient is awake, alert and oriented x 3. Insight, judgment intact.  ASSESSMENT / PLAN: 71 year old pleasant white female with a history of SVT seen today for assessment of her reactive airways disease most likely related to mild intermittent asthma which is somewhat controlled at this time however has a persistent cough and is here to assess her inhaler medication  At this time I would recommend continuing and restarting her Flovent and Serevent which has worked in the past. I have educated the patient about rinsing her mouth after each use I have also educated the patient to avoid triggers  At this time patient is satisfied with the visit and she would like to  restart her Flovent and Serevent and reassess her respiratory status in about 4 weeks.  I would also reassess respiratory status after giving a trial of Serevent and Flovent and will check eosinophil levels and IgE levels at next visit if needed.  Patient has not had any acute asthma  exacerbations in the past 6-9 months   Patient satisfied with Plan of action and management. All questions answered Follow up in 4 weeks  Cosby Proby Patricia Pesa, M.D.  [facility]  Medical Director [place] Director [facility] Cardio-Pulmonary Department

## 2017-05-03 ENCOUNTER — Ambulatory Visit
Admission: RE | Admit: 2017-05-03 | Discharge: 2017-05-03 | Disposition: A | Discharge status: Home / Self Care | Payer: 59 | Source: Ambulatory Visit | Attending: Student in an Organized Health Care Education/Training Program | Admitting: Student in an Organized Health Care Education/Training Program

## 2017-05-03 ENCOUNTER — Encounter: Payer: Self-pay | Admitting: Student in an Organized Health Care Education/Training Program

## 2017-05-03 ENCOUNTER — Ambulatory Visit (HOSPITAL_BASED_OUTPATIENT_CLINIC_OR_DEPARTMENT_OTHER): Payer: 59 | Admitting: Student in an Organized Health Care Education/Training Program

## 2017-05-03 ENCOUNTER — Other Ambulatory Visit: Payer: Self-pay

## 2017-05-03 VITALS — BP 148/97 | HR 84 | Temp 98.5°F | Resp 20 | Ht 66.0 in | Wt 220.0 lb

## 2017-05-03 DIAGNOSIS — M5136 Other intervertebral disc degeneration, lumbar region: Secondary | ICD-10-CM

## 2017-05-03 DIAGNOSIS — Z886 Allergy status to analgesic agent status: Secondary | ICD-10-CM | POA: Insufficient documentation

## 2017-05-03 DIAGNOSIS — M48061 Spinal stenosis, lumbar region without neurogenic claudication: Secondary | ICD-10-CM

## 2017-05-03 DIAGNOSIS — M25552 Pain in left hip: Secondary | ICD-10-CM | POA: Insufficient documentation

## 2017-05-03 DIAGNOSIS — M4726 Other spondylosis with radiculopathy, lumbar region: Secondary | ICD-10-CM | POA: Insufficient documentation

## 2017-05-03 DIAGNOSIS — Z90710 Acquired absence of both cervix and uterus: Secondary | ICD-10-CM | POA: Diagnosis not present

## 2017-05-03 DIAGNOSIS — Z880 Allergy status to penicillin: Secondary | ICD-10-CM | POA: Insufficient documentation

## 2017-05-03 DIAGNOSIS — M5116 Intervertebral disc disorders with radiculopathy, lumbar region: Secondary | ICD-10-CM | POA: Diagnosis not present

## 2017-05-03 DIAGNOSIS — M545 Low back pain: Secondary | ICD-10-CM | POA: Insufficient documentation

## 2017-05-03 DIAGNOSIS — Z9889 Other specified postprocedural states: Secondary | ICD-10-CM | POA: Diagnosis not present

## 2017-05-03 DIAGNOSIS — Z881 Allergy status to other antibiotic agents status: Secondary | ICD-10-CM | POA: Diagnosis not present

## 2017-05-03 DIAGNOSIS — M5416 Radiculopathy, lumbar region: Secondary | ICD-10-CM

## 2017-05-03 MED ORDER — SODIUM CHLORIDE 0.9% FLUSH
2.0000 mL | Freq: Once | INTRAVENOUS | Status: AC
  Administered 2017-05-03: 10 mL

## 2017-05-03 MED ORDER — DEXAMETHASONE SODIUM PHOSPHATE 10 MG/ML IJ SOLN
INTRAMUSCULAR | Status: AC
  Filled 2017-05-03: qty 1

## 2017-05-03 MED ORDER — LIDOCAINE HCL (PF) 1 % IJ SOLN
4.5000 mL | Freq: Once | INTRAMUSCULAR | Status: AC
  Administered 2017-05-03: 5 mL

## 2017-05-03 MED ORDER — DEXAMETHASONE SODIUM PHOSPHATE 10 MG/ML IJ SOLN
10.0000 mg | Freq: Once | INTRAMUSCULAR | Status: AC
  Administered 2017-05-03: 10 mg

## 2017-05-03 MED ORDER — IOPAMIDOL (ISOVUE-M 200) INJECTION 41%
10.0000 mL | Freq: Once | INTRAMUSCULAR | Status: AC
  Administered 2017-05-03: 20 mL via EPIDURAL
  Filled 2017-05-03: qty 10

## 2017-05-03 MED ORDER — LIDOCAINE HCL (PF) 1 % IJ SOLN
INTRAMUSCULAR | Status: AC
  Filled 2017-05-03: qty 10

## 2017-05-03 MED ORDER — SODIUM CHLORIDE 0.9 % IJ SOLN
INTRAMUSCULAR | Status: AC
  Filled 2017-05-03: qty 10

## 2017-05-03 MED ORDER — ROPIVACAINE HCL 2 MG/ML IJ SOLN
2.0000 mL | Freq: Once | INTRAMUSCULAR | Status: AC
  Administered 2017-05-03: 2 mL via EPIDURAL

## 2017-05-03 MED ORDER — ROPIVACAINE HCL 2 MG/ML IJ SOLN
INTRAMUSCULAR | Status: AC
  Filled 2017-05-03: qty 10

## 2017-05-03 NOTE — Progress Notes (Signed)
Patient's Name: Audrey Martinez  MRN: [id number]  Referring Provider: Marin Olp, PA-C  DOB: [birth year]  PCP: Glean Hess, MD  DOS: 05/03/2017  Note by: Gillis Santa, MD  Service setting: Ambulatory outpatient  Specialty: Interventional Pain Management  Location: [facility] (AMB) Pain Management Facility    Patient type: New patient ("FAST-TRACK" Evaluation)   Warning: This referral option does not include the extensive pharmacological evaluation required for [place] to take over the patient's medication management. The "Fast-Track" system is designed to bypass the new patient referral waiting list, as well as the normal patient evaluation process, in order to provide a patient in distress with a timely pain management intervention. Because the system was not designed to unfairly get a patient into our pain practice ahead of those already waiting, certain restrictions apply. By requesting a "Fast-Track" consult, the referring physician has opted to continue managing the patient's medications in order to get interventional urgent care.  Primary Reason for Visit: Interventional Pain Management Treatment. CC: Hip Pain (left) and Back Pain (left, lower)   Procedure  HPI  [name] is a 71 y.o. year old, female patient, who comes today for a  "Fast-Track" new patient evaluation, as requested by Marin Olp, PA-C. The patient has been made aware that this type of referral option is reserved for the Interventional Pain Management portion of our practice and completely excludes the option of medication management. Her primarily concern today is the Hip Pain (left) and Back Pain (left, lower)  Pain Assessment: Location: Lower Back Radiating: numbness in left leg, some in right upper leg Onset: More than a month ago Duration: Chronic pain Quality: Numbness Severity: 3 /10 (self-reported pain score)  Note: Reported level is inconsistent with clinical observations. Clinically the patient looks like a  2/10 A 2/10 is viewed as "Mild to Moderate" and described as noticeable and distracting. Impossible to hide from other people. More frequent flare-ups. Still possible to adapt and function close to normal. It can be very annoying and may have occasional stronger flare-ups. With discipline, patients may get used to it and adapt.       When using our objective Pain Scale, levels between 6 and 10/10 are said to belong in an emergency room, as it progressively worsens from a 6/10, described as severely limiting, requiring emergency care not usually available at an outpatient pain management facility. At a 6/10 level, communication becomes difficult and requires great effort. Assistance to reach the emergency department may be required. Facial flushing and profuse sweating along with potentially dangerous increases in heart rate and blood pressure will be evident. Effect on ADL:   Timing: Constant Modifying factors: sitting, moving around  Onset and Duration: Gradual and Date of onset: 07/2016 Cause of pain: Unknown Severity: Getting worse Timing: Morning and During activity or exercise Aggravating Factors: Prolonged standing Alleviating Factors: Sitting and Walking Associated Problems: Numbness, Temperature changes and Tingling Quality of Pain: Aching, Agonizing, Sharp and Tingling Previous Examinations or Tests: MRI scan, Neurological evaluation and Neurosurgical evaluation Previous Treatments: Epidural steroid injections  The patient comes into the clinics today, referred to [place] for a lumbar epidural steroid injection.  Briefly patient is a 71 year old female who recently moved from [place] in 2018 status post lumbar spine surgery August 2017 after a fall.  Patient endorses bilateral leg numbness, left greater than right.  She also endorses axial low back pain.  She states that her left leg numbness extends beyond her knee into her toes occasionally.  She  states that her right leg numbness  approximately stops proximal to her knee.  No bowel or bladder dysfunction.  Has seen neurosurgery, obtain lumbar MRI which is below.  Recommended to have epidural steroid injection for multilevel lumbar radiculopathy and neuroforaminal disease.  Meds   Current Outpatient Medications:  .  albuterol (PROVENTIL HFA;VENTOLIN HFA) 108 (90 Base) MCG/ACT inhaler, Inhale 2 puffs into the lungs every 6 (six) hours as needed for wheezing or shortness of breath., Disp: 1 Inhaler, Rfl: 0 .  baclofen (LIORESAL) 10 MG tablet, Take 10 mg by mouth 3 (three) times daily., Disp: , Rfl:  .  Cranberry 1000 MG CAPS, Take by mouth., Disp: , Rfl:  .  diltiazem (CARDIZEM CD) 180 MG 24 hr capsule, Take 1 capsule (180 mg total) daily by mouth., Disp: 90 capsule, Rfl: 4 .  fluticasone (FLOVENT HFA) 110 MCG/ACT inhaler, Inhale 2 puffs into the lungs 2 (two) times daily., Disp: 1 Inhaler, Rfl: 2 .  gabapentin (NEURONTIN) 300 MG capsule, Take 300 mg by mouth 3 (three) times daily. Three capsules daily at bedtime., Disp: , Rfl:  .  Ginger 500 MG CAPS, Take by mouth., Disp: , Rfl:  .  Misc Natural Products (TART CHERRY ADVANCED PO), Take by mouth., Disp: , Rfl:  .  Multiple Vitamins-Minerals (MULTIVITAMIN WITH MINERALS) tablet, Take 1 tablet by mouth daily., Disp: , Rfl:  .  salmeterol (SEREVENT DISKUS) 50 MCG/DOSE diskus inhaler, Inhale 1 puff into the lungs 2 (two) times daily., Disp: 1 Inhaler, Rfl: 2 .  Turmeric 500 MG CAPS, Take by mouth., Disp: , Rfl:  .  vitamin E 100 UNIT capsule, Take by mouth daily., Disp: , Rfl:  .  calcium-vitamin D (OSCAL WITH D) 500-200 MG-UNIT tablet, Take 1 tablet by mouth., Disp: , Rfl:  .  diltiazem (CARDIZEM) 60 MG tablet, Take 60 mg by mouth 4 (four) times daily., Disp: , Rfl:  .  Fluticasone-Salmeterol (ADVAIR DISKUS) 100-50 MCG/DOSE AEPB, Inhale 1 puff into the lungs 2 (two) times daily. (Patient not taking: Reported on 05/03/2017), Disp: 60 each, Rfl: 5  Imaging Review    Lumbosacral  Imaging: Lumbar MR wo contrast:  Results for orders placed during the hospital encounter of 04/10/17  MR LUMBAR SPINE WO CONTRAST   Narrative CLINICAL DATA:  Low back pain, bilateral leg numbness and pain. LEFT worse than RIGHT. History of L2 through L4 laminectomies 2017.  EXAM: MRI LUMBAR SPINE WITHOUT CONTRAST  TECHNIQUE: Multiplanar, multisequence MR imaging of the lumbar spine was performed. No intravenous contrast was administered.  COMPARISON:  Lumbar spine radiograph report dated March 24, 2017 though images are not available for direct comparison.  FINDINGS: SEGMENTATION: For the purposes of this report, the last well-formed intervertebral disc will be reported as L5-S1.  ALIGNMENT: Maintained lumbar lordosis. No malalignment.  VERTEBRAE:V moderate old L3 compression fracture with approximately 50% height loss. The remaining lumbar vertebral bodies intact. Moderate to severe L3-4 and L4-5 disc height loss, moderate at L2-3 with disc desiccation all levels. Mild L5-S1 disc height loss. Acute on chronic moderate discogenic endplate changes Z0-2, H8-5, mild at L4-5. Generalized bright T1 and T2 bone marrow signal compatible with osteopenia.  CONUS MEDULLARIS: Conus medullaris terminates at L1-2 and demonstrates normal morphology and signal characteristics. Cauda equina is normal.  PARASPINAL AND SOFT TISSUES: Included prevertebral and paraspinal soft tissues are nonacute. LEFT paraspinal muscle denervation at and below the level surgical intervention. Bilateral renal cysts, incompletely evaluated.  DISC LEVELS:  T12-L1: No disc bulge,  canal stenosis nor neural foraminal narrowing.  L1-2: Annular bulging, no canal stenosis or neural foraminal narrowing.  L2-3: Status post LEFT hemilaminectomy. Moderate broad-based disc bulge eccentric laterally with endplate spurring. No canal stenosis. Mild bilateral neural foraminal narrowing and, encroachment upon  the exited RIGHT L 2 nerve.  L3-4: Status post LEFT hemilaminectomy. Small broad-based disc bulge and endplate spurring. Small LEFT subarticular disc extrusion with 5 mm contiguous superior migration, difficult to quantify on axial sequences due to slice selection. Mild RIGHT, moderate LEFT facet arthropathy. No canal stenosis though, LEFT lateral recess effacement may affect the traversing LEFT L4 nerve. Mild RIGHT, moderate to severe LEFT neural foraminal narrowing.  L4-5: Small broad-based disc bulge. Moderate to severe facet arthropathy and ligamentum flavum redundancy. Moderate canal stenosis. Mild RIGHT, moderate LEFT neural foraminal narrowing.  L5-S1: Small broad-based disc bulge. Moderate RIGHT, severe LEFT facet arthropathy. No canal stenosis. Mild RIGHT, moderate LEFT neural foraminal narrowing.  IMPRESSION: 1. Status post LEFT L2-3 and L3-4 hemilaminectomy. 2. Small L3-4 LEFT subarticular disc extrusion. 3. Degenerative change of the lumbar spine. Moderate canal stenosis L4-5. 4. Neural foraminal narrowing L2-3 through L5-S1: Moderate to severe on the LEFT at L3-4. 5. Old moderate L3 compression fracture.  Osteopenia.   Electronically Signed   By: Elon Alas M.D.   On: 04/11/2017 02:19     Complexity Note: Imaging results reviewed. Results shared with Ms. Mancel Bale, using Layman's terms.                         ROS  Cardiovascular History: Heart trouble and Abnormal heart rhythm Pulmonary or Respiratory History: Wheezing and difficulty taking a deep full breath (Asthma), Snoring  and Coughing up mucus (Bronchitis) Neurological History: Stroke (Residual deficits or weakness: unknown) Review of Past Neurological Studies: No results found for this or any previous visit. Psychological-Psychiatric History: No reported psychological or psychiatric signs or symptoms such as difficulty sleeping, anxiety, depression, delusions or hallucinations (schizophrenial),  mood swings (bipolar disorders) or suicidal ideations or attempts Gastrointestinal History: Reflux or heatburn Genitourinary History: No reported renal or genitourinary signs or symptoms such as difficulty voiding or producing urine, peeing blood, non-functioning kidney, kidney stones, difficulty emptying the bladder, difficulty controlling the flow of urine, or chronic kidney disease Hematological History: Brusing easily Endocrine History: No reported endocrine signs or symptoms such as high or low blood sugar, rapid heart rate due to high thyroid levels, obesity or weight gain due to slow thyroid or thyroid disease Rheumatologic History: Joint aches and or swelling due to excess weight (Osteoarthritis) Musculoskeletal History: Negative for myasthenia gravis, muscular dystrophy, multiple sclerosis or malignant hyperthermia Work History: Retired  Allergies  Ms. Keel is allergic to aspirin; ciprofloxacin-dexamethasone; crestor [rosuvastatin]; amoxicillin; doxycycline hyclate; hydrochlorothiazide; keflex [cephalexin]; lovastatin; simvastatin; erythromycin; ibuprofen; neomycin-bacitracin zn-polymyx; omeprazole; other; and tape.    Note: Lab results reviewed.  [place]  Drug: Ms. Demeter  reports that she does not use drugs. Alcohol:  reports that she does not drink alcohol. Tobacco:  reports that  has never smoked. she has never used smokeless tobacco. Medical:  has a past medical history of Asthma, Herniated disc, cervical, Left wrist fracture, Stroke ([place]) (1998), and SVT (supraventricular tachycardia) ([place]). Family: family history includes Diabetes in her mother; Stroke in her mother; Valvular heart disease in her father.  Past Surgical History:  Procedure Laterality Date  . BACK SURGERY  2017   L2-4 laminectomy and foraminal stenosis  . CARDIAC CATHETERIZATION  2009  [place] in [employer]  . CARDIAC ELECTROPHYSIOLOGY STUDY AND ABLATION  2009  . CATARACT  EXTRACTION, BILATERAL  2010  . derrick procedure     removed parts of radius from both wrists  . ESOPHAGOGASTRODUODENOSCOPY  2013   gastritis; done for complaint of dysphagia  . lymph node removal     neck  . MOHS SURGERY  07/2016   BCCA nose  . REPLACEMENT TOTAL KNEE  2013   [facility]  . TOTAL SHOULDER REPLACEMENT  2012   [facility]    Active Ambulatory Problems    Diagnosis Date Noted  . Osteoporosis 03/21/2017  . Personal history of transient ischemic attack (TIA), and cerebral infarction without residual deficits 04/22/2003  . Hyperlipidemia 05/12/2003  . Obesity 09/14/2011  . Benign essential tremor 03/21/2017  . Atrial tachycardia ([place]) 04/14/2017  . SVT (supraventricular tachycardia) ([place]) 04/14/2017  . Essential hypertension 04/14/2017  . Reactive airway disease 04/14/2017   Resolved Ambulatory Problems    Diagnosis Date Noted  . No Resolved Ambulatory Problems   Past Medical History:  Diagnosis Date  . Asthma   . Herniated disc, cervical   . Left wrist fracture   . Stroke ([place]) 1998  . SVT (supraventricular tachycardia) ([place])    Constitutional Exam  General appearance: Well nourished, well developed, and well hydrated. In no apparent acute distress Vitals:   05/03/17 0957 05/03/17 1058 05/03/17 1105 05/03/17 1111  BP: (!) 153/92 (!) 141/97 (!) 148/99 (!) 148/97  Pulse: 84     Resp: 18 20 16 20   Temp: 98.5 F (36.9 C)     TempSrc: Oral     SpO2: 96% 97% 97% 95%  Weight: 220 lb (99.8 kg)     Height: 5\' 6"  (1.676 m)      BMI Assessment: Estimated body mass index is 35.51 kg/m as calculated from the following:   Height as of this encounter: 5\' 6"  (1.676 m).   Weight as of this encounter: 220 lb (99.8 kg).  BMI interpretation table: BMI level Category Range association with higher incidence of chronic pain  <18 kg/m2 Underweight   18.5-24.9 kg/m2 Ideal body weight   25-29.9 kg/m2 Overweight Increased incidence by 20%  30-34.9 kg/m2 Obese (Class I) Increased  incidence by 68%  35-39.9 kg/m2 Severe obesity (Class II) Increased incidence by 136%  >40 kg/m2 Extreme obesity (Class III) Increased incidence by 254%   BMI Readings from Last 4 Encounters:  05/03/17 35.51 kg/m  04/25/17 36.15 kg/m  04/24/17 35.99 kg/m  04/14/17 35.91 kg/m   Wt Readings from Last 4 Encounters:  05/03/17 220 lb (99.8 kg)  04/25/17 224 lb (101.6 kg)  04/24/17 223 lb (101.2 kg)  04/14/17 222 lb 8 oz (100.9 kg)  Psych/Mental status: Alert, oriented x 3 (person, place, & time)       Eyes: PERLA Respiratory: No evidence of acute respiratory distress  Lumbar Spine Area Exam  Skin & Axial Inspection: Well healed scar from previous spine surgery detected Alignment: Symmetrical Functional ROM: Decreased ROM, bilaterally Stability: No instability detected Muscle Tone/Strength: Functionally intact. No obvious neuro-muscular anomalies detected. Sensory (Neurological): Dermatomal pain pattern and musculoskeletal pain referral pattern into bilateral buttocks Palpation: Complains of area being tender to palpation Bilateral Fist Percussion Test Provocative Tests: Lumbar Hyperextension and rotation test: Positive bilaterally for facet joint pain.left greater than right Lumbar Lateral bending test: Positive ipsilateral radicular pain, on the left. Positive for left-sided foraminal stenosis. Patrick's Maneuver: evaluation deferred today  Gait & Posture Assessment  Ambulation: Unassisted Gait: Relatively normal for age and body habitus Posture: WNL   Lower Extremity Exam    Side: Right lower extremity  Side: Left lower extremity  Skin & Extremity Inspection: Skin color, temperature, and hair growth are WNL. No peripheral edema or cyanosis. No masses, redness, swelling, asymmetry, or associated skin lesions. No contractures.  Skin & Extremity Inspection: Skin color, temperature, and hair growth are WNL. No peripheral edema or cyanosis. No masses, redness,  swelling, asymmetry, or associated skin lesions. No contractures.  Functional ROM: Unrestricted ROM          Functional ROM: Unrestricted ROM          Muscle Tone/Strength: Functionally intact. No obvious neuro-muscular anomalies detected.  Muscle Tone/Strength: Functionally intact. No obvious neuro-muscular anomalies detected.  Sensory (Neurological): Unimpaired  Sensory (Neurological): Unimpaired  Palpation: No palpable anomalies  Palpation: No palpable anomalies    Procedure 71 year old female with a history of left L2/3, L3/4 hemilaminectomy in August 2017 status post a fall who presents with worsening bilateral leg numbness, left greater than right with repeat lumbar MRI obtained from neurosurgery showing small left L3-L4 subarticular disc extrusion, degenerative changes of lumbar spine along with moderate canal stenosis at L4-L5, and neuroforaminal narrowing L2/3 through L5/S1 which is moderate to severe on the left.  We discussed the risks and benefits of a lumbar epidural steroid injection.  Patient is not on any blood thinners.  All questions and concerns were answered and patient would like to proceed.  Plan: -Left lumbar epidural steroid injection, goal target space L4-L5, below hemilaminectomy.

## 2017-05-03 NOTE — Progress Notes (Signed)
Patient's Name: Audrey Martinez  MRN: [id number]  Referring Provider: Marin Olp, PA-C  DOB: Nov 12, 1945  PCP: Glean Hess, MD  DOS: 05/03/2017  Note by: Gillis Santa, MD  Service setting: Ambulatory outpatient  Specialty: Interventional Pain Management  Patient type: Established  Location: [facility] (AMB) Pain Management Facility  Visit type: Interventional Procedure   Primary Reason for Visit: Interventional Pain Management Treatment. CC: Hip Pain (left) and Back Pain (left, lower)  Procedure:  Anesthesia, Analgesia, Anxiolysis:  Type: Therapeutic Inter-Laminar Epidural Steroid Injection Region: Lumbar Level: L4-5 Level. Laterality: Left-Sided         Type: Local Anesthesia Local Anesthetic: Lidocaine 1% Route: Infiltration (/IM) IV Access: Declined Sedation: Meaningful verbal contact was maintained at all times during the procedure  Indication(s): Analgesia and Anxiety   Indications: 1. Lumbar radiculopathy   2. Status post hemilaminotomy   3. Lumbar degenerative disc disease   4. Spinal stenosis of lumbar region without neurogenic claudication    Pain Score: Pre-procedure: 3 /10 Post-procedure: 3 /10  Pre-op Assessment:  Audrey Martinez is a 71 y.o. (year old), female patient, seen today for interventional treatment. She  has a past surgical history that includes Cardiac electrophysiology study and ablation (2009); Total shoulder replacement (2012); Replacement total knee (2013); Back surgery (2017); lymph node removal; Mohs surgery (07/2016); derrick procedure; Cataract extraction, bilateral (2010); Esophagogastroduodenoscopy (2013); and Cardiac catheterization (2009). Audrey Martinez has a current medication list which includes the following prescription(s): albuterol, baclofen, cranberry, diltiazem, fluticasone, gabapentin, ginger, misc natural products, multivitamin with minerals, salmeterol, turmeric, vitamin e, calcium-vitamin d, diltiazem, and fluticasone-salmeterol. Her  primarily concern today is the Hip Pain (left) and Back Pain (left, lower)  Initial Vital Signs: Blood pressure (!) 153/92, pulse 84, temperature 98.5 F (36.9 C), temperature source Oral, resp. rate 18, height 5\' 6"  (1.676 m), weight 220 lb (99.8 kg), SpO2 96 %. BMI: Estimated body mass index is 35.51 kg/m as calculated from the following:   Height as of this encounter: 5\' 6"  (1.676 m).   Weight as of this encounter: 220 lb (99.8 kg).  Risk Assessment: Allergies: Reviewed. She is allergic to aspirin; ciprofloxacin-dexamethasone; crestor [rosuvastatin]; amoxicillin; doxycycline hyclate; hydrochlorothiazide; keflex [cephalexin]; lovastatin; simvastatin; erythromycin; ibuprofen; neomycin-bacitracin zn-polymyx; omeprazole; other; and tape.  Allergy Precautions: None required Coagulopathies: Reviewed. None identified.  Blood-thinner therapy: None at this time Active Infection(s): Reviewed. None identified. Audrey Martinez is afebrile  Site Confirmation: Audrey Martinez was asked to confirm the procedure and laterality before marking the site Procedure checklist: Completed Consent: Before the procedure and under the influence of no sedative(s), amnesic(s), or anxiolytics, the patient was informed of the treatment options, risks and possible complications. To fulfill our ethical and legal obligations, as recommended by the American Medical Association's Code of Ethics, I have informed the patient of my clinical impression; the nature and purpose of the treatment or procedure; the risks, benefits, and possible complications of the intervention; the alternatives, including doing nothing; the risk(s) and benefit(s) of the alternative treatment(s) or procedure(s); and the risk(s) and benefit(s) of doing nothing. The patient was provided information about the general risks and possible complications associated with the procedure. These may include, but are not limited to: failure to achieve desired goals, infection,  bleeding, organ or nerve damage, allergic reactions, paralysis, and death. In addition, the patient was informed of those risks and complications associated to Spine-related procedures, such as failure to decrease pain; infection (i.e.: Meningitis, epidural or intraspinal abscess); bleeding (i.e.: epidural hematoma, subarachnoid hemorrhage, or  any other type of intraspinal or peri-dural bleeding); organ or nerve damage (i.e.: Any type of peripheral nerve, nerve root, or spinal cord injury) with subsequent damage to sensory, motor, and/or autonomic systems, resulting in permanent pain, numbness, and/or weakness of one or several areas of the body; allergic reactions; (i.e.: anaphylactic reaction); and/or death. Furthermore, the patient was informed of those risks and complications associated with the medications. These include, but are not limited to: allergic reactions (i.e.: anaphylactic or anaphylactoid reaction(s)); adrenal axis suppression; blood sugar elevation that in diabetics may result in ketoacidosis or comma; water retention that in patients with history of congestive heart failure may result in shortness of breath, pulmonary edema, and decompensation with resultant heart failure; weight gain; swelling or edema; medication-induced neural toxicity; particulate matter embolism and blood vessel occlusion with resultant organ, and/or nervous system infarction; and/or aseptic necrosis of one or more joints. Finally, the patient was informed that Medicine is not an exact science; therefore, there is also the possibility of unforeseen or unpredictable risks and/or possible complications that may result in a catastrophic outcome. The patient indicated having understood very clearly. We have given the patient no guarantees and we have made no promises. Enough time was given to the patient to ask questions, all of which were answered to the patient's satisfaction. Audrey Martinez has indicated that she wanted to  continue with the procedure. Attestation: I, the ordering provider, attest that I have discussed with the patient the benefits, risks, side-effects, alternatives, likelihood of achieving goals, and potential problems during recovery for the procedure that I have provided informed consent. Date: 05/03/2017; Time: 10:56 AM  Pre-Procedure Preparation:  Monitoring: As per clinic protocol. Respiration, ETCO2, SpO2, BP, heart rate and rhythm monitor placed and checked for adequate function Safety Precautions: Patient was assessed for positional comfort and pressure points before starting the procedure. Time-out: I initiated and conducted the "Time-out" before starting the procedure, as per protocol. The patient was asked to participate by confirming the accuracy of the "Time Out" information. Verification of the correct person, site, and procedure were performed and confirmed by me, the nursing staff, and the patient. "Time-out" conducted as per Joint Commission's Universal Protocol (UP.01.01.01). "Time-out" Date & Time: 05/03/2017; 1102 hrs.  Description of Procedure Process:   Position: Prone with head of the table was raised to facilitate breathing. Target Area: The interlaminar space, initially targeting the lower laminar border of the superior vertebral body. Approach: Paramedial approach. Area Prepped: Entire Posterior Lumbar Region Prepping solution: ChloraPrep (2% chlorhexidine gluconate and 70% isopropyl alcohol) Safety Precautions: Aspiration looking for blood return was conducted prior to all injections. At no point did we inject any substances, as a needle was being advanced. No attempts were made at seeking any paresthesias. Safe injection practices and needle disposal techniques used. Medications properly checked for expiration dates. SDV (single dose vial) medications used. Description of the Procedure: Protocol guidelines were followed. The procedure needle was introduced through the skin,  ipsilateral to the reported pain, and advanced to the target area. Bone was contacted and the needle walked caudad, until the lamina was cleared. The epidural space was identified using "loss-of-resistance technique" with 2-3 ml of PF-NaCl (0.9% NSS), in a 5cc LOR glass syringe. Vitals:   05/03/17 0957 05/03/17 1058 05/03/17 1105 05/03/17 1111  BP: (!) 153/92 (!) 141/97 (!) 148/99 (!) 148/97  Pulse: 84     Resp: 18 20 16 20   Temp: 98.5 F (36.9 C)     TempSrc: Oral  SpO2: 96% 97% 97% 95%  Weight: 220 lb (99.8 kg)     Height: 5\' 6"  (1.676 m)       Start Time: 1102 hrs. End Time: 1111 hrs. Materials:  Needle(s) Type: Epidural needle Gauge: 17G Length: 3.5-in Medication(s): We administered ropivacaine (PF) 2 mg/mL (0.2%), sodium chloride flush, iopamidol, dexamethasone, and lidocaine (PF). Please see chart orders for dosing details. 8cc solution: 6 cc PF NS, 1 cc 0.2% Ropivacaine, 1cc of decadron 10 mg/cc Imaging Guidance (Spinal):  Type of Imaging Technique: Fluoroscopy Guidance (Spinal) Indication(s): Assistance in needle guidance and placement for procedures requiring needle placement in or near specific anatomical locations not easily accessible without such assistance. Exposure Time: Please see nurses notes. Contrast: Before injecting any contrast, we confirmed that the patient did not have an allergy to iodine, shellfish, or radiological contrast. Once satisfactory needle placement was completed at the desired level, radiological contrast was injected. Contrast injected under live fluoroscopy. No contrast complications. See chart for type and volume of contrast used. Fluoroscopic Guidance: I was personally present during the use of fluoroscopy. "Tunnel Vision Technique" used to obtain the best possible view of the target area. Parallax error corrected before commencing the procedure. "Direction-depth-direction" technique used to introduce the needle under continuous pulsed  fluoroscopy. Once target was reached, antero-posterior, oblique, and lateral fluoroscopic projection used confirm needle placement in all planes. Images permanently stored in EMR. Interpretation: I personally interpreted the imaging intraoperatively. Adequate needle placement confirmed in multiple planes. Appropriate spread of contrast into desired area was observed. No evidence of afferent or efferent intravascular uptake. No intrathecal or subarachnoid spread observed. Permanent images saved into the patient's record.  Antibiotic Prophylaxis:  Indication(s): None identified Antibiotic given: None  Post-operative Assessment:  EBL: None Complications: No immediate post-treatment complications observed by team, or reported by patient. Note: The patient tolerated the entire procedure well. A repeat set of vitals were taken after the procedure and the patient was kept under observation following institutional policy, for this type of procedure. Post-procedural neurological assessment was performed, showing return to baseline, prior to discharge. The patient was provided with post-procedure discharge instructions, including a section on how to identify potential problems. Should any problems arise concerning this procedure, the patient was given instructions to immediately contact us, at any time, without hesitation. In any case, we plan to contact the patient by telephone for a follow-up status report regarding this interventional procedure. Comments:  No additional relevant information. 5 out of 5 strength bilateral lower extremity: Plantar flexion, dorsiflexion, knee flexion, knee extension. Plan of Care   Imaging Orders     DG C-Arm 1-60 Min-No Report Procedure Orders    No procedure(s) ordered today    Medications ordered for procedure: Meds ordered this encounter  Medications  . ropivacaine (PF) 2 mg/mL (0.2%) (NAROPIN) injection 2 mL  . sodium chloride flush (NS) 0.9 % injection 2 mL  .  iopamidol (ISOVUE-M) 41 % intrathecal injection 10 mL  . dexamethasone (DECADRON) injection 10 mg  . lidocaine (PF) (XYLOCAINE) 1 % injection 4.5 mL   Medications administered: We administered ropivacaine (PF) 2 mg/mL (0.2%), sodium chloride flush, iopamidol, dexamethasone, and lidocaine (PF).  See the medical record for exact dosing, route, and time of administration.  This SmartLink is deprecated. Use AVSMEDLIST instead to display the medication list for a patient. Disposition: Discharge home  Discharge Date & Time: 05/03/2017; 1125 hrs.   Physician-requested Follow-up: Return in about 2 weeks (around 05/17/2017) for Post Procedure Evaluation. Future  Appointments  Date Time Provider [place]  05/18/2017 10:15 AM Gillis Santa, MD [facility] None  06/06/2017  9:30 AM Flora Lipps, MD [facility] None  11/08/2017  9:30 AM Glean Hess, MD [facility] None   Primary Care Physician: Glean Hess, MD Location: [facility] Outpatient [facility] Management Facility Note by: Gillis Santa, MD Date: 05/03/2017; Time: 2:15 PM  Disclaimer:  Medicine is not an exact science. The only guarantee in medicine is that nothing is guaranteed. It is important to note that the decision to proceed with this intervention was based on the information collected from the patient. The Data and conclusions were drawn from the patient's questionnaire, the interview, and the physical examination. Because the information was provided in large part by the patient, it cannot be guaranteed that it has not been purposely or unconsciously manipulated. Every effort has been made to obtain as much relevant data as possible for this evaluation. It is important to note that the conclusions that lead to this procedure are derived in large part from the available data. Always take into account that the treatment will also be dependent on availability of resources and existing treatment guidelines, considered by other Pain Management  Practitioners as being common knowledge and practice, at the time of the intervention. For Medico-Legal purposes, it is also important to point out that variation in procedural techniques and pharmacological choices are the acceptable norm. The indications, contraindications, technique, and results of the above procedure should only be interpreted and judged by a Board-Certified Interventional Pain Specialist with extensive familiarity and expertise in the same exact procedure and technique.

## 2017-05-03 NOTE — Patient Instructions (Signed)
Pain Management Discharge Instructions  General Discharge Instructions :  If you need to reach your doctor call: Monday-Friday 8:00 am - 4:00 pm at [phone] or toll free [phone].  After clinic hours [phone] to have operator reach doctor.  Bring all of your medication bottles to all your appointments in the pain clinic.  To cancel or reschedule your appointment with Pain Management please remember to call 24 hours in advance to avoid a fee.  Refer to the educational materials which you have been given on: General Risks, I had my Procedure. Discharge Instructions, Post Sedation.  Post Procedure Instructions:  The drugs you were given will stay in your system until tomorrow, so for the next 24 hours you should not drive, make any legal decisions or drink any alcoholic beverages.  You may eat anything you prefer, but it is better to start with liquids then soups and crackers, and gradually work up to solid foods.  Please notify your doctor immediately if you have any unusual bleeding, trouble breathing or pain that is not related to your normal pain.  Depending on the type of procedure that was done, some parts of your body may feel week and/or numb.  This usually clears up by tonight or the next day.  Walk with the use of an assistive device or accompanied by an adult for the 24 hours.  You may use ice on the affected area for the first 24 hours.  Put ice in a Ziploc bag and cover with a towel and place against area 15 minutes on 15 minutes off.  You may switch to heat after 24 hours.Epidural Steroid Injection Patient Information  Description: The epidural space surrounds the nerves as they exit the spinal cord.  In some patients, the nerves can be compressed and inflamed by a bulging disc or a tight spinal canal (spinal stenosis).  By injecting steroids into the epidural space, we can bring irritated nerves into direct contact with a potentially helpful medication.  These  steroids act directly on the irritated nerves and can reduce swelling and inflammation which often leads to decreased pain.  Epidural steroids may be injected anywhere along the spine and from the neck to the low back depending upon the location of your pain.   After numbing the skin with local anesthetic (like Novocaine), a small needle is passed into the epidural space slowly.  You may experience a sensation of pressure while this is being done.  The entire block usually last less than 10 minutes.  Conditions which may be treated by epidural steroids:   Low back and leg pain  Neck and arm pain  Spinal stenosis  Post-laminectomy syndrome  Herpes zoster (shingles) pain  Pain from compression fractures  Preparation for the injection:  1. Do not eat any solid food or dairy products within 8 hours of your appointment.  2. You may drink clear liquids up to 3 hours before appointment.  Clear liquids include water, black coffee, juice or soda.  No milk or cream please. 3. You may take your regular medication, including pain medications, with a sip of water before your appointment  Diabetics should hold regular insulin (if taken separately) and take 1/2 normal NPH dos the morning of the procedure.  Carry some sugar containing items with you to your appointment. 4. A driver must accompany you and be prepared to drive you home after your procedure.  5. Bring all your current medications with your. 6. An IV may be inserted and   sedation may be given at the discretion of the physician.   7. A blood pressure cuff, EKG and other monitors will often be applied during the procedure.  Some patients may need to have extra oxygen administered for a short period. 8. You will be asked to provide medical information, including your allergies, prior to the procedure.  We must know immediately if you are taking blood thinners (like Coumadin/Warfarin)  Or if you are allergic to IV iodine contrast (dye). We must  know if you could possible be pregnant.  Possible side-effects:  Bleeding from needle site  Infection (rare, may require surgery)  Nerve injury (rare)  Numbness & tingling (temporary)  Difficulty urinating (rare, temporary)  Spinal headache ( a headache worse with upright posture)  Light -headedness (temporary)  Pain at injection site (several days)  Decreased blood pressure (temporary)  Weakness in arm/leg (temporary)  Pressure sensation in back/neck (temporary)  Call if you experience:  Fever/chills associated with headache or increased back/neck pain.  Headache worsened by an upright position.  New onset weakness or numbness of an extremity below the injection site  Hives or difficulty breathing (go to the emergency room)  Inflammation or drainage at the infection site  Severe back/neck pain  Any new symptoms which are concerning to you  Please note:  Although the local anesthetic injected can often make your back or neck feel good for several hours after the injection, the pain will likely return.  It takes 3-7 days for steroids to work in the epidural space.  You may not notice any pain relief for at least that one week.  If effective, we will often do a series of three injections spaced 3-6 weeks apart to maximally decrease your pain.  After the initial series, we generally will wait several months before considering a repeat injection of the same type.  If you have any questions, please call [phone] [facility] 

## 2017-05-03 NOTE — Progress Notes (Signed)
Safety precautions to be maintained throughout the outpatient stay will include: orient to surroundings, keep bed in low position, maintain call bell within reach at all times, provide assistance with transfer out of bed and ambulation.  

## 2017-05-04 ENCOUNTER — Telehealth: Payer: Self-pay | Admitting: *Deleted

## 2017-05-04 NOTE — Telephone Encounter (Signed)
No problems post procedure. 

## 2017-05-18 ENCOUNTER — Encounter: Payer: Self-pay | Admitting: Student in an Organized Health Care Education/Training Program

## 2017-05-18 ENCOUNTER — Other Ambulatory Visit: Payer: Self-pay

## 2017-05-18 ENCOUNTER — Ambulatory Visit
Payer: 59 | Attending: Student in an Organized Health Care Education/Training Program | Admitting: Student in an Organized Health Care Education/Training Program

## 2017-05-18 VITALS — BP 129/81 | HR 95 | Temp 99.3°F | Resp 16 | Ht 66.0 in | Wt 220.0 lb

## 2017-05-18 DIAGNOSIS — E669 Obesity, unspecified: Secondary | ICD-10-CM | POA: Diagnosis not present

## 2017-05-18 DIAGNOSIS — M5416 Radiculopathy, lumbar region: Secondary | ICD-10-CM | POA: Diagnosis not present

## 2017-05-18 DIAGNOSIS — G250 Essential tremor: Secondary | ICD-10-CM | POA: Insufficient documentation

## 2017-05-18 DIAGNOSIS — Z880 Allergy status to penicillin: Secondary | ICD-10-CM | POA: Insufficient documentation

## 2017-05-18 DIAGNOSIS — M79605 Pain in left leg: Secondary | ICD-10-CM | POA: Insufficient documentation

## 2017-05-18 DIAGNOSIS — I10 Essential (primary) hypertension: Secondary | ICD-10-CM | POA: Diagnosis not present

## 2017-05-18 DIAGNOSIS — Z881 Allergy status to other antibiotic agents status: Secondary | ICD-10-CM | POA: Diagnosis not present

## 2017-05-18 DIAGNOSIS — Z96619 Presence of unspecified artificial shoulder joint: Secondary | ICD-10-CM | POA: Diagnosis not present

## 2017-05-18 DIAGNOSIS — G8929 Other chronic pain: Secondary | ICD-10-CM | POA: Insufficient documentation

## 2017-05-18 DIAGNOSIS — M5116 Intervertebral disc disorders with radiculopathy, lumbar region: Secondary | ICD-10-CM | POA: Insufficient documentation

## 2017-05-18 DIAGNOSIS — I471 Supraventricular tachycardia: Secondary | ICD-10-CM | POA: Diagnosis not present

## 2017-05-18 DIAGNOSIS — M545 Low back pain: Secondary | ICD-10-CM | POA: Insufficient documentation

## 2017-05-18 DIAGNOSIS — Z9889 Other specified postprocedural states: Secondary | ICD-10-CM | POA: Diagnosis not present

## 2017-05-18 DIAGNOSIS — M5136 Other intervertebral disc degeneration, lumbar region: Secondary | ICD-10-CM | POA: Diagnosis not present

## 2017-05-18 DIAGNOSIS — E785 Hyperlipidemia, unspecified: Secondary | ICD-10-CM | POA: Diagnosis not present

## 2017-05-18 DIAGNOSIS — M48061 Spinal stenosis, lumbar region without neurogenic claudication: Secondary | ICD-10-CM | POA: Diagnosis not present

## 2017-05-18 DIAGNOSIS — Z79899 Other long term (current) drug therapy: Secondary | ICD-10-CM | POA: Insufficient documentation

## 2017-05-18 DIAGNOSIS — Z8673 Personal history of transient ischemic attack (TIA), and cerebral infarction without residual deficits: Secondary | ICD-10-CM | POA: Diagnosis not present

## 2017-05-18 DIAGNOSIS — Z886 Allergy status to analgesic agent status: Secondary | ICD-10-CM | POA: Insufficient documentation

## 2017-05-18 DIAGNOSIS — M51369 Other intervertebral disc degeneration, lumbar region without mention of lumbar back pain or lower extremity pain: Secondary | ICD-10-CM | POA: Insufficient documentation

## 2017-05-18 NOTE — Progress Notes (Signed)
Patient's Name: Audrey Martinez  MRN: [id number]  Referring Provider: Glean Hess, MD  DOB: 07-04-1945  PCP: Glean Hess, MD  DOS: 05/18/2017  Note by: Gillis Santa, MD  Service setting: Ambulatory outpatient  Specialty: Interventional Pain Management  Location: [facility] (AMB) Pain Management Facility    Patient type: Established   Primary Reason(s) for Visit: Encounter for post-procedure evaluation of chronic illness with mild to moderate exacerbation CC: Back Pain (down the left leg.)  HPI  Ms. Alverio is a 71 y.o. year old, female patient, who comes today for a post-procedure evaluation. She has Osteoporosis; Personal history of transient ischemic attack (TIA), and cerebral infarction without residual deficits; Hyperlipidemia; Obesity; Benign essential tremor; Atrial tachycardia ([place]); SVT (supraventricular tachycardia) ([place]); Essential hypertension; Reactive airway disease; Lumbar radiculopathy; Status post hemilaminotomy; Lumbar degenerative disc disease; and Spinal stenosis of lumbar region without neurogenic claudication on their problem list. Her primarily concern today is the Back Pain (down the left leg.)  Pain Assessment: Location: Lower, Left Back Radiating: lrft leg. Onset: More than a month ago Duration: Chronic pain Quality: Tingling, Constant Severity: 2 /10 (self-reported pain score)  Note: Reported level is compatible with observation.                         When using our objective Pain Scale, levels between 6 and 10/10 are said to belong in an emergency room, as it progressively worsens from a 6/10, described as severely limiting, requiring emergency care not usually available at an outpatient pain management facility. At a 6/10 level, communication becomes difficult and requires great effort. Assistance to reach the emergency department may be required. Facial flushing and profuse sweating along with potentially dangerous increases in heart rate and blood pressure  will be evident. Effect on ADL: no effect Timing: Constant Modifying factors:    Ms. Dray comes in today for post-procedure evaluation after the treatment done on 05/03/2017.  Further details on both, my assessment(s), as well as the proposed treatment plan, please see below.  Post-Procedure Assessment  05/03/2017 Procedure: L L4-L5 ESI Pre-procedure pain score:  3/10 Post-procedure pain score: 3/10         Influential Factors: BMI: 35.51 kg/m Intra-procedural challenges: None observed.         Assessment challenges: None detected.              Reported side-effects: None.        Post-procedural adverse reactions or complications: None reported         Sedation: Please see nurses note. When no sedatives are used, the analgesic levels obtained are directly associated to the effectiveness of the local anesthetics. However, when sedation is provided, the level of analgesia obtained during the initial 1 hour following the intervention, is believed to be the result of a combination of factors. These factors may include, but are not limited to: 1. The effectiveness of the local anesthetics used. 2. The effects of the analgesic(s) and/or anxiolytic(s) used. 3. The degree of discomfort experienced by the patient at the time of the procedure. 4. The patients ability and reliability in recalling and recording the events. 5. The presence and influence of possible secondary gains and/or psychosocial factors. Reported result: Relief experienced during the 1st hour after the procedure: 100 % (Ultra-Short Term Relief)            Interpretative annotation: Clinically appropriate result. Analgesia during this period is likely to be Local Anesthetic and/or IV  Sedative (Analgesic/Anxiolytic) related.          Effects of local anesthetic: The analgesic effects attained during this period are directly associated to the localized infiltration of local anesthetics and therefore cary significant diagnostic  value as to the etiological location, or anatomical origin, of the pain. Expected duration of relief is directly dependent on the pharmacodynamics of the local anesthetic used. Long-acting (4-6 hours) anesthetics used.  Reported result: Relief during the next 4 to 6 hour after the procedure: 100 % (Short-Term Relief)            Interpretative annotation: Clinically appropriate result. Analgesia during this period is likely to be Local Anesthetic-related.          Long-term benefit: Defined as the period of time past the expected duration of local anesthetics (1 hour for short-acting and 4-6 hours for long-acting). With the possible exception of prolonged sympathetic blockade from the local anesthetics, benefits during this period are typically attributed to, or associated with, other factors such as analgesic sensory neuropraxia, antiinflammatory effects, or beneficial biochemical changes provided by agents other than the local anesthetics.  Reported result: Extended relief following procedure: 30-40%(Long-Term Relief)            Interpretative annotation: Clinically appropriate result. Good relief. No permanent benefit expected. Inflammation plays a part in the etiology to the pain.          Current benefits: Defined as reported results that persistent at this point in time.   Analgesia: 25-50 %            Function: Somewhat improved ROM: Somewhat improved Interpretative annotation: Recurrence of symptoms. Therapeutic benefit observed. Effective therapeutic approach.          Interpretation: Results would suggest a successful diagnostic and therapeutic intervention.                  Plan:  Please see "Plan of Care" for details.          Meds   Current Outpatient Medications:  .  albuterol (PROVENTIL HFA;VENTOLIN HFA) 108 (90 Base) MCG/ACT inhaler, Inhale 2 puffs into the lungs every 6 (six) hours as needed for wheezing or shortness of breath., Disp: 1 Inhaler, Rfl: 0 .  baclofen (LIORESAL)  10 MG tablet, Take 10 mg by mouth 3 (three) times daily., Disp: , Rfl:  .  calcium-vitamin D (OSCAL WITH D) 500-200 MG-UNIT tablet, Take 1 tablet by mouth., Disp: , Rfl:  .  Cranberry 1000 MG CAPS, Take by mouth., Disp: , Rfl:  .  diltiazem (CARDIZEM CD) 180 MG 24 hr capsule, Take 1 capsule (180 mg total) daily by mouth., Disp: 90 capsule, Rfl: 4 .  diltiazem (CARDIZEM) 60 MG tablet, Take 60 mg by mouth 4 (four) times daily., Disp: , Rfl:  .  fluticasone (FLOVENT HFA) 110 MCG/ACT inhaler, Inhale 2 puffs into the lungs 2 (two) times daily., Disp: 1 Inhaler, Rfl: 2 .  gabapentin (NEURONTIN) 300 MG capsule, Take 300 mg by mouth 3 (three) times daily. Three capsules daily at bedtime., Disp: , Rfl:  .  Ginger 500 MG CAPS, Take by mouth., Disp: , Rfl:  .  Misc Natural Products (TART CHERRY ADVANCED PO), Take by mouth., Disp: , Rfl:  .  Multiple Vitamins-Minerals (MULTIVITAMIN WITH MINERALS) tablet, Take 1 tablet by mouth daily., Disp: , Rfl:  .  salmeterol (SEREVENT DISKUS) 50 MCG/DOSE diskus inhaler, Inhale 1 puff into the lungs 2 (two) times daily., Disp: 1 Inhaler, Rfl: 2 .  Turmeric 500 MG CAPS, Take by mouth., Disp: , Rfl:  .  vitamin E 100 UNIT capsule, Take by mouth daily., Disp: , Rfl:  .  Fluticasone-Salmeterol (ADVAIR DISKUS) 100-50 MCG/DOSE AEPB, Inhale 1 puff into the lungs 2 (two) times daily. (Patient not taking: Reported on 05/03/2017), Disp: 60 each, Rfl: 5  ROS  Constitutional: Denies any fever or chills Gastrointestinal: No reported hemesis, hematochezia, vomiting, or acute GI distress Musculoskeletal: Denies any acute onset joint swelling, redness, loss of ROM, or weakness Neurological: No reported episodes of acute onset apraxia, aphasia, dysarthria, agnosia, amnesia, paralysis, loss of coordination, or loss of consciousness  Allergies  Ms. Viverette is allergic to aspirin; ciprofloxacin-dexamethasone; crestor [rosuvastatin]; amoxicillin; doxycycline hyclate; hydrochlorothiazide;  keflex [cephalexin]; lovastatin; simvastatin; erythromycin; ibuprofen; neomycin-bacitracin zn-polymyx; omeprazole; other; and tape.  [place]  Drug: Ms. Tellez  reports that she does not use drugs. Alcohol:  reports that she does not drink alcohol. Tobacco:  reports that  has never smoked. she has never used smokeless tobacco. Medical:  has a past medical history of Asthma, Herniated disc, cervical, Left wrist fracture, Stroke ([place]) (1998), and SVT (supraventricular tachycardia) ([place]). Surgical: Ms. Boss  has a past surgical history that includes Cardiac electrophysiology study and ablation (2009); Total shoulder replacement (2012); Replacement total knee (2013); Back surgery (2017); lymph node removal; Mohs surgery (07/2016); derrick procedure; Cataract extraction, bilateral (2010); Esophagogastroduodenoscopy (2013); and Cardiac catheterization (2009). Family: family history includes Diabetes in her mother; Stroke in her mother; Valvular heart disease in her father.  Constitutional Exam  General appearance: Well nourished, well developed, and well hydrated. In no apparent acute distress Vitals:   05/18/17 1023  BP: 129/81  Pulse: 95  Resp: 16  Temp: 99.3 F (37.4 C)  TempSrc: Oral  SpO2: 95%  Weight: 220 lb (99.8 kg)  Height: 5\' 6"  (1.676 m)   BMI Assessment: Estimated body mass index is 35.51 kg/m as calculated from the following:   Height as of this encounter: 5\' 6"  (1.676 m).   Weight as of this encounter: 220 lb (99.8 kg).  BMI interpretation table: BMI level Category Range association with higher incidence of chronic pain  <18 kg/m2 Underweight   18.5-24.9 kg/m2 Ideal body weight   25-29.9 kg/m2 Overweight Increased incidence by 20%  30-34.9 kg/m2 Obese (Class I) Increased incidence by 68%  35-39.9 kg/m2 Severe obesity (Class II) Increased incidence by 136%  >40 kg/m2 Extreme obesity (Class III) Increased incidence by 254%   BMI Readings from Last 4 Encounters:  05/18/17  35.51 kg/m  05/03/17 35.51 kg/m  04/25/17 36.15 kg/m  04/24/17 35.99 kg/m   Wt Readings from Last 4 Encounters:  05/18/17 220 lb (99.8 kg)  05/03/17 220 lb (99.8 kg)  04/25/17 224 lb (101.6 kg)  04/24/17 223 lb (101.2 kg)  Psych/Mental status: Alert, oriented x 3 (person, place, & time)       Eyes: PERLA Respiratory: No evidence of acute respiratory distress  Cervical Spine Area Exam  Skin & Axial Inspection: No masses, redness, edema, swelling, or associated skin lesions Alignment: Symmetrical Functional ROM: Unrestricted ROM      Stability: No instability detected Muscle Tone/Strength: Functionally intact. No obvious neuro-muscular anomalies detected. Sensory (Neurological): Unimpaired Palpation: No palpable anomalies              Upper Extremity (UE) Exam    Side: Right upper extremity  Side: Left upper extremity  Skin & Extremity Inspection: Skin color, temperature, and hair growth are WNL. No peripheral edema or  cyanosis. No masses, redness, swelling, asymmetry, or associated skin lesions. No contractures.  Skin & Extremity Inspection: Skin color, temperature, and hair growth are WNL. No peripheral edema or cyanosis. No masses, redness, swelling, asymmetry, or associated skin lesions. No contractures.  Functional ROM: Unrestricted ROM          Functional ROM: Unrestricted ROM          Muscle Tone/Strength: Functionally intact. No obvious neuro-muscular anomalies detected.  Muscle Tone/Strength: Functionally intact. No obvious neuro-muscular anomalies detected.  Sensory (Neurological): Unimpaired          Sensory (Neurological): Unimpaired          Palpation: No palpable anomalies              Palpation: No palpable anomalies              Specialized Test(s): Deferred         Specialized Test(s): Deferred          Thoracic Spine Area Exam  Skin & Axial Inspection: No masses, redness, or swelling Alignment: Symmetrical Functional ROM: Unrestricted ROM Stability: No  instability detected Muscle Tone/Strength: Functionally intact. No obvious neuro-muscular anomalies detected. Sensory (Neurological): Unimpaired Muscle strength & Tone: No palpable anomalies  Lumbar Spine Area Exam  Skin & Axial Inspection: Well healed scar from previous spine surgery detected Alignment: Symmetrical Functional ROM: Decreased ROM, bilaterally Stability: No instability detected Muscle Tone/Strength: Functionally intact. No obvious neuro-muscular anomalies detected. Sensory (Neurological): Dermatomal pain pattern and musculoskeletal pain referral pattern into bilateral buttocks.  Improved after treatment. Palpation: Complains of area being tender to palpation Bilateral Fist Percussion Test Provocative Tests: Lumbar Hyperextension and rotation test: Positive bilaterally for facet joint pain.left greater than right.  Improved after treatment Lumbar Lateral bending test: Positive ipsilateral radicular pain, on the left. Positive for left-sided foraminal stenosis.  Improved after treatment Patrick's Maneuver: evaluation deferred today                    Gait & Posture Assessment  Ambulation: Unassisted Gait: Relatively normal for age and body habitus Posture: WNL   Lower Extremity Exam    Side: Right lower extremity  Side: Left lower extremity  Skin & Extremity Inspection: Skin color, temperature, and hair growth are WNL. No peripheral edema or cyanosis. No masses, redness, swelling, asymmetry, or associated skin lesions. No contractures.  Skin & Extremity Inspection: Skin color, temperature, and hair growth are WNL. No peripheral edema or cyanosis. No masses, redness, swelling, asymmetry, or associated skin lesions. No contractures.  Functional ROM: Unrestricted ROM          Functional ROM: Unrestricted ROM          Muscle Tone/Strength: Functionally intact. No obvious neuro-muscular anomalies detected.  Muscle Tone/Strength: Functionally intact. No obvious  neuro-muscular anomalies detected.  Sensory (Neurological): Unimpaired  Sensory (Neurological): Unimpaired  Palpation: No palpable anomalies  Palpation: No palpable anomalies    Assessment  Primary Diagnosis & Pertinent Problem List: The primary encounter diagnosis was Lumbar radiculopathy. Diagnoses of Status post hemilaminotomy, Lumbar degenerative disc disease, and Spinal stenosis of lumbar region without neurogenic claudication were also pertinent to this visit.  Status Diagnosis  Responding Controlled Controlled 1. Lumbar radiculopathy   2. Status post hemilaminotomy   3. Lumbar degenerative disc disease   4. Spinal stenosis of lumbar region without neurogenic claudication       71 year old female with a history of left L2/3, L3/4 hemilaminectomy in August 2017 status post a fall  who presents with worsening bilateral leg numbness, left greater than right with repeat lumbar MRI obtained from neurosurgery showing small left L3-L4 subarticular disc extrusion, degenerative changes of lumbar spine along with moderate canal stenosis at L4-L5, and neuroforaminal narrowing L2/3 through L5/S1 which is moderate to severe on the left.   Patient is status post left L4-L5 ESI on May 03, 2017.  Patient notes significant pain relief for the first couple of days and now endorses ongoing pain relief about 40-50%.  She notes less left lower extremity paresthesias and less pain.  She is able to ambulate with greater ease and with reduced pain.  Given that the patient had a significant yet partial response in terms of duration, we discussed repeating lumbar epidural steroid injection for chronic lumbar radiculopathy.  Risk and benefits were discussed and patient agreement with plan.    Plan: -Repeat left L4-L5 ESI without sedation.  Orders Placed This Encounter  Procedures  . Lumbar Epidural Injection    Standing Status:   Future    Standing Expiration Date:   06/17/2017    Scheduling  Instructions:     Side: Left-sided     Sedation: with sedation     Timeframe: next available    Order Specific Question:   Where will this procedure be performed?    Answer:   [facility]    Provider-requested follow-up: Return in about 3 weeks (around 06/08/2017).  Future Appointments  Date Time Provider [place]  06/05/2017 10:30 AM Gillis Santa, MD [facility] None  06/06/2017  9:30 AM Flora Lipps, MD [facility] None  11/08/2017  9:30 AM Glean Hess, MD [facility] None    Primary Care Physician: Glean Hess, MD Location: [facility] Note by: Gillis Santa, M.D Date: 05/18/2017; Time: 12:27 PM  There are no Patient Instructions on file for this visit.

## 2017-05-25 ENCOUNTER — Telehealth: Payer: Self-pay | Admitting: Internal Medicine

## 2017-05-25 MED ORDER — FLUTICASONE PROPIONATE HFA 110 MCG/ACT IN AERO: 2.0000 | INHALATION_SPRAY | Freq: Two times a day (BID) | RESPIRATORY_TRACT | 2 refills | Status: DC

## 2017-05-25 NOTE — Telephone Encounter (Signed)
Our fax machine has been down. Rx Flovent has been refilled. Nothing further needed.

## 2017-05-25 NOTE — Telephone Encounter (Signed)
°*  STAT* If patient is at the pharmacy, call can be transferred to refill team.   1. Which medications need to be refilled? (please list name of each medication and dose if known) [place]   2. Which pharmacy/location (including street and city if local pharmacy) is medication to be sent to? [facility] in [place]  3. Do they need a 30 day or 90 day supply? 3 inhalers  Patient states she only has 5 puffs left Pharmacy says they have sent fax but have not received anything

## 2017-05-29 ENCOUNTER — Other Ambulatory Visit: Payer: Self-pay | Admitting: Internal Medicine

## 2017-05-29 MED ORDER — FLUTICASONE PROPIONATE HFA 110 MCG/ACT IN AERO: 2.0000 | INHALATION_SPRAY | Freq: Two times a day (BID) | RESPIRATORY_TRACT | 1 refills | Status: DC

## 2017-06-05 ENCOUNTER — Ambulatory Visit
Admission: RE | Admit: 2017-06-05 | Discharge: 2017-06-05 | Disposition: A | Discharge status: Home / Self Care | Payer: 59 | Source: Ambulatory Visit | Attending: Student in an Organized Health Care Education/Training Program | Admitting: Student in an Organized Health Care Education/Training Program

## 2017-06-05 ENCOUNTER — Encounter: Payer: Self-pay | Admitting: Student in an Organized Health Care Education/Training Program

## 2017-06-05 ENCOUNTER — Other Ambulatory Visit: Payer: Self-pay

## 2017-06-05 ENCOUNTER — Ambulatory Visit (HOSPITAL_BASED_OUTPATIENT_CLINIC_OR_DEPARTMENT_OTHER): Payer: 59 | Admitting: Student in an Organized Health Care Education/Training Program

## 2017-06-05 VITALS — BP 134/90 | HR 101 | Temp 98.4°F | Resp 18 | Ht 66.0 in | Wt 220.0 lb

## 2017-06-05 DIAGNOSIS — Z9889 Other specified postprocedural states: Secondary | ICD-10-CM | POA: Insufficient documentation

## 2017-06-05 DIAGNOSIS — M25552 Pain in left hip: Secondary | ICD-10-CM | POA: Insufficient documentation

## 2017-06-05 DIAGNOSIS — M48061 Spinal stenosis, lumbar region without neurogenic claudication: Secondary | ICD-10-CM

## 2017-06-05 DIAGNOSIS — M5416 Radiculopathy, lumbar region: Secondary | ICD-10-CM

## 2017-06-05 DIAGNOSIS — M549 Dorsalgia, unspecified: Secondary | ICD-10-CM | POA: Diagnosis not present

## 2017-06-05 DIAGNOSIS — M79605 Pain in left leg: Secondary | ICD-10-CM | POA: Diagnosis not present

## 2017-06-05 DIAGNOSIS — M5116 Intervertebral disc disorders with radiculopathy, lumbar region: Secondary | ICD-10-CM | POA: Insufficient documentation

## 2017-06-05 DIAGNOSIS — M5136 Other intervertebral disc degeneration, lumbar region: Secondary | ICD-10-CM

## 2017-06-05 DIAGNOSIS — Z886 Allergy status to analgesic agent status: Secondary | ICD-10-CM | POA: Diagnosis not present

## 2017-06-05 DIAGNOSIS — Z881 Allergy status to other antibiotic agents status: Secondary | ICD-10-CM | POA: Insufficient documentation

## 2017-06-05 DIAGNOSIS — Z880 Allergy status to penicillin: Secondary | ICD-10-CM | POA: Insufficient documentation

## 2017-06-05 MED ORDER — FENTANYL CITRATE (PF) 100 MCG/2ML IJ SOLN: 25.0000 ug | INTRAMUSCULAR | Status: DC | PRN

## 2017-06-05 MED ORDER — LIDOCAINE HCL (PF) 1 % IJ SOLN: 10.0000 mL | Freq: Once | INTRAMUSCULAR | Status: DC

## 2017-06-05 MED ORDER — DEXAMETHASONE SODIUM PHOSPHATE 10 MG/ML IJ SOLN: 10.0000 mg | Freq: Once | INTRAMUSCULAR | Status: DC

## 2017-06-05 MED ORDER — IOPAMIDOL (ISOVUE-M 200) INJECTION 41%
10.0000 mL | Freq: Once | INTRAMUSCULAR | Status: AC
  Administered 2017-06-05: 10 mL via EPIDURAL

## 2017-06-05 MED ORDER — IOPAMIDOL (ISOVUE-M 200) INJECTION 41%
10.0000 mL | Freq: Once | INTRAMUSCULAR | Status: DC
  Filled 2017-06-05: qty 10

## 2017-06-05 MED ORDER — LACTATED RINGERS IV SOLN: 1000.0000 mL | Freq: Once | INTRAVENOUS | Status: DC

## 2017-06-05 MED ORDER — SODIUM CHLORIDE 0.9% FLUSH: 2.0000 mL | Freq: Once | INTRAVENOUS | Status: DC

## 2017-06-05 MED ORDER — LIDOCAINE HCL (PF) 1 % IJ SOLN
10.0000 mL | Freq: Once | INTRAMUSCULAR | Status: DC
  Filled 2017-06-05: qty 10

## 2017-06-05 MED ORDER — DEXAMETHASONE SODIUM PHOSPHATE 10 MG/ML IJ SOLN
10.0000 mg | Freq: Once | INTRAMUSCULAR | Status: DC
  Filled 2017-06-05: qty 1

## 2017-06-05 MED ORDER — ROPIVACAINE HCL 2 MG/ML IJ SOLN
2.0000 mL | Freq: Once | INTRAMUSCULAR | Status: DC
  Filled 2017-06-05: qty 10

## 2017-06-05 MED ORDER — ROPIVACAINE HCL 2 MG/ML IJ SOLN: 2.0000 mL | Freq: Once | INTRAMUSCULAR | Status: DC

## 2017-06-05 NOTE — Patient Instructions (Signed)
Pain Management Discharge Instructions  General Discharge Instructions :  If you need to reach your doctor call: Monday-Friday 8:00 am - 4:00 pm at [phone] or toll free [phone].  After clinic hours [phone] to have operator reach doctor.  Bring all of your medication bottles to all your appointments in the pain clinic.  To cancel or reschedule your appointment with Pain Management please remember to call 24 hours in advance to avoid a fee.  Refer to the educational materials which you have been given on: General Risks, I had my Procedure. Discharge Instructions, Post Sedation.  Post Procedure Instructions:  Please notify your doctor immediately if you have any unusual bleeding, trouble breathing or pain that is not related to your normal pain.  Depending on the type of procedure that was done, some parts of your body may feel week and/or numb.  This usually clears up by tonight or the next day.  Walk with the use of an assistive device or accompanied by an adult for the 24 hours.  You may use ice on the affected area for the first 24 hours.  Put ice in a Ziploc bag and cover with a towel and place against area 15 minutes on 15 minutes off.  You may switch to heat after 24 hours. 

## 2017-06-05 NOTE — Progress Notes (Signed)
Patient's Name: Audrey Martinez  MRN: [id number]  Referring Provider: Glean Hess, MD  DOB: [birth year]  PCP: Glean Hess, MD  DOS: 06/05/2017  Note by: Gillis Santa, MD  Service setting: Ambulatory outpatient  Specialty: Interventional Pain Management  Patient type: Established  Location: [facility] (AMB) Pain Management Facility  Visit type: Interventional Procedure   Primary Reason for Visit: Interventional Pain Management Treatment. CC: Leg Pain (left); Hip Pain (left); and Back Pain (left)  Procedure:  Anesthesia, Analgesia, Anxiolysis:  Type: Therapeutic Inter-Laminar Epidural Steroid Injection Region: Lumbar Level: L4-5 Level. Laterality: Left-Sided         Type: Local Anesthesia Local Anesthetic: Lidocaine 1% Route: Infiltration ([place]ry/IM) IV Access: Declined Sedation: Meaningful verbal contact was maintained at all times during the procedure  Indication(s): Analgesia and Anxiety   Indications: 1. Lumbar radiculopathy   2. Status post hemilaminotomy   3. Lumbar degenerative disc disease   4. Spinal stenosis of lumbar region without neurogenic claudication    Pain Score: Pre-procedure: 2 /10 Post-procedure: 0-No pain/10  Pre-op Assessment:  [name] is a 72 y.o. (year old), female patient, seen today for interventional treatment. She  has a past surgical history that includes Cardiac electrophysiology study and ablation (2009); Total shoulder replacement (2012); Replacement total knee (2013); Back surgery (2017); lymph node removal; Mohs surgery (07/2016); derrick procedure; Cataract extraction, bilateral (2010); Esophagogastroduodenoscopy (2013); and Cardiac catheterization (2009). [name] has a current medication list which includes the following prescription(s): albuterol, baclofen, calcium-vitamin d, cranberry, diltiazem, diltiazem, fluticasone, gabapentin, ginger, misc natural products, multivitamin with minerals, salmeterol, turmeric, vitamin e, and  fluticasone-salmeterol, and the following Facility-Administered Medications: dexamethasone, dexamethasone, fentanyl, iopamidol, lactated ringers, lidocaine (pf), lidocaine (pf), ropivacaine (pf) 2 mg/ml (0.2%), ropivacaine (pf) 2 mg/ml (0.2%), sodium chloride flush, and sodium chloride flush. Her primarily concern today is the Leg Pain (left); Hip Pain (left); and Back Pain (left)  Initial Vital Signs: Blood pressure (!) 153/92, pulse 84, temperature 98.5 F (36.9 C), temperature source Oral, resp. rate 18, height 5\' 6"  (1.676 m), weight 220 lb (99.8 kg), SpO2 96 %. BMI: Estimated body mass index is 35.51 kg/m as calculated from the following:   Height as of this encounter: 5\' 6"  (1.676 m).   Weight as of this encounter: 220 lb (99.8 kg).  Risk Assessment: Allergies: Reviewed. She is allergic to aspirin; ciprofloxacin-dexamethasone; crestor [rosuvastatin]; amoxicillin; doxycycline hyclate; hydrochlorothiazide; keflex [cephalexin]; lovastatin; simvastatin; erythromycin; ibuprofen; neomycin-bacitracin zn-polymyx; omeprazole; other; and tape.  Allergy Precautions: None required Coagulopathies: Reviewed. None identified.  Blood-thinner therapy: None at this time Active Infection(s): Reviewed. None identified. [name] is afebrile  Site Confirmation: [name] was asked to confirm the procedure and laterality before marking the site Procedure checklist: Completed Consent: Before the procedure and under the influence of no sedative(s), amnesic(s), or anxiolytics, the patient was informed of the treatment options, risks and possible complications. To fulfill our ethical and legal obligations, as recommended by the American Medical Association's Code of Ethics, I have informed the patient of my clinical impression; the nature and purpose of the treatment or procedure; the risks, benefits, and possible complications of the intervention; the alternatives, including doing nothing; the risk(s) and  benefit(s) of the alternative treatment(s) or procedure(s); and the risk(s) and benefit(s) of doing nothing. The patient was provided information about the general risks and possible complications associated with the procedure. These may include, but are not limited to: failure to achieve desired goals, infection, bleeding, organ or nerve damage, allergic reactions, paralysis,  and death. In addition, the patient was informed of those risks and complications associated to Spine-related procedures, such as failure to decrease pain; infection (i.e.: Meningitis, epidural or intraspinal abscess); bleeding (i.e.: epidural hematoma, subarachnoid hemorrhage, or any other type of intraspinal or peri-dural bleeding); organ or nerve damage (i.e.: Any type of peripheral nerve, nerve root, or spinal cord injury) with subsequent damage to sensory, motor, and/or autonomic systems, resulting in permanent pain, numbness, and/or weakness of one or several areas of the body; allergic reactions; (i.e.: anaphylactic reaction); and/or death. Furthermore, the patient was informed of those risks and complications associated with the medications. These include, but are not limited to: allergic reactions (i.e.: anaphylactic or anaphylactoid reaction(s)); adrenal axis suppression; blood sugar elevation that in diabetics may result in ketoacidosis or comma; water retention that in patients with history of congestive heart failure may result in shortness of breath, pulmonary edema, and decompensation with resultant heart failure; weight gain; swelling or edema; medication-induced neural toxicity; particulate matter embolism and blood vessel occlusion with resultant organ, and/or nervous system infarction; and/or aseptic necrosis of one or more joints. Finally, the patient was informed that Medicine is not an exact science; therefore, there is also the possibility of unforeseen or unpredictable risks and/or possible complications that may  result in a catastrophic outcome. The patient indicated having understood very clearly. We have given the patient no guarantees and we have made no promises. Enough time was given to the patient to ask questions, all of which were answered to the patient's satisfaction. [name] has indicated that she wanted to continue with the procedure. Attestation: I, the ordering provider, attest that I have discussed with the patient the benefits, risks, side-effects, alternatives, likelihood of achieving goals, and potential problems during recovery for the procedure that I have provided informed consent. Date: 06/05/2017; Time: 10:56 AM  Pre-Procedure Preparation:  Monitoring: As per clinic protocol. Respiration, ETCO2, SpO2, BP, heart rate and rhythm monitor placed and checked for adequate function Safety Precautions: Patient was assessed for positional comfort and pressure points before starting the procedure. Time-out: I initiated and conducted the "Time-out" before starting the procedure, as per protocol. The patient was asked to participate by confirming the accuracy of the "Time Out" information. Verification of the correct person, site, and procedure were performed and confirmed by me, the nursing staff, and the patient. "Time-out" conducted as per Joint Commission's Universal Protocol (UP.01.01.01). "Time-out" Date & Time: 06/05/2017; 1115 hrs.  Description of Procedure Process:   Position: Prone with head of the table was raised to facilitate breathing. Target Area: The interlaminar space, initially targeting the lower laminar border of the superior vertebral body. Approach: Paramedial approach. Area Prepped: Entire Posterior Lumbar Region Prepping solution: ChloraPrep (2% chlorhexidine gluconate and 70% isopropyl alcohol) Safety Precautions: Aspiration looking for blood return was conducted prior to all injections. At no point did we inject any substances, as a needle was being advanced. No attempts  were made at seeking any paresthesias. Safe injection practices and needle disposal techniques used. Medications properly checked for expiration dates. SDV (single dose vial) medications used. Description of the Procedure: Protocol guidelines were followed. The procedure needle was introduced through the skin, ipsilateral to the reported pain, and advanced to the target area. Bone was contacted and the needle walked caudad, until the lamina was cleared. The epidural space was identified using "loss-of-resistance technique" with 2-3 ml of PF-NaCl (0.9% NSS), in a 5cc LOR glass syringe. Vitals:   06/05/17 1114 06/05/17 1118 06/05/17 1123  06/05/17 1129  BP: (!) 141/100 (!) 151/99 (!) 127/98 134/90  Pulse: 95 95 99 (!) 101  Resp: 18 20 18 18   Temp:      TempSrc:      SpO2: 96% 96% 94% 96%  Weight:      Height:        Start Time: 1115 hrs. End Time: 1124 hrs. Materials:  Needle(s) Type: Epidural needle Gauge: 17G Length: 3.5-in Medication(s): We administered iopamidol. Please see chart orders for dosing details. 8cc solution: 6 cc PF NS, 1 cc 0.2% Ropivacaine, 1cc of decadron 10 mg/cc Imaging Guidance (Spinal):  Type of Imaging Technique: Fluoroscopy Guidance (Spinal) Indication(s): Assistance in needle guidance and placement for procedures requiring needle placement in or near specific anatomical locations not easily accessible without such assistance. Exposure Time: Please see nurses notes. Contrast: Before injecting any contrast, we confirmed that the patient did not have an allergy to iodine, shellfish, or radiological contrast. Once satisfactory needle placement was completed at the desired level, radiological contrast was injected. Contrast injected under live fluoroscopy. No contrast complications. See chart for type and volume of contrast used. Fluoroscopic Guidance: I was personally present during the use of fluoroscopy. "Tunnel Vision Technique" used to obtain the best possible view of  the target area. Parallax error corrected before commencing the procedure. "Direction-depth-direction" technique used to introduce the needle under continuous pulsed fluoroscopy. Once target was reached, antero-posterior, oblique, and lateral fluoroscopic projection used confirm needle placement in all planes. Images permanently stored in EMR. Interpretation: I personally interpreted the imaging intraoperatively. Adequate needle placement confirmed in multiple planes. Appropriate spread of contrast into desired area was observed. No evidence of afferent or efferent intravascular uptake. No intrathecal or subarachnoid spread observed. Permanent images saved into the patient's record.  Antibiotic Prophylaxis:  Indication(s): None identified Antibiotic given: None  Post-operative Assessment:  EBL: None Complications: No immediate post-treatment complications observed by team, or reported by patient. Note: The patient tolerated the entire procedure well. A repeat set of vitals were taken after the procedure and the patient was kept under observation following institutional policy, for this type of procedure. Post-procedural neurological assessment was performed, showing return to baseline, prior to discharge. The patient was provided with post-procedure discharge instructions, including a section on how to identify potential problems. Should any problems arise concerning this procedure, the patient was given instructions to immediately contact us, at any time, without hesitation. In any case, we plan to contact the patient by telephone for a follow-up status report regarding this interventional procedure. Comments:  No additional relevant information. 5 out of 5 strength bilateral lower extremity: Plantar flexion, dorsiflexion, knee flexion, knee extension. Plan of Care    Imaging Orders     DG C-Arm 1-60 Min-No Report Procedure Orders    No procedure(s) ordered today    Medications ordered for  procedure: Meds ordered this encounter  Medications  . iopamidol (ISOVUE-M) 41 % intrathecal injection 10 mL  . ropivacaine (PF) 2 mg/mL (0.2%) (NAROPIN) injection 2 mL  . sodium chloride flush (NS) 0.9 % injection 2 mL  . dexamethasone (DECADRON) injection 10 mg  . lidocaine (PF) (XYLOCAINE) 1 % injection 10 mL  . lactated ringers infusion 1,000 mL  . fentaNYL (SUBLIMAZE) injection 25-100 mcg    Make sure Narcan is available in the pyxis when using this medication. In the event of respiratory depression (RR< 8/min): Titrate NARCAN (naloxone) in increments of 0.1 to 0.2 mg IV at 2-3 minute intervals, until desired degree  of reversal.  . sodium chloride flush (NS) 0.9 % injection 2 mL  . ropivacaine (PF) 2 mg/mL (0.2%) (NAROPIN) injection 2 mL  . iopamidol (ISOVUE-M) 41 % intrathecal injection 10 mL  . dexamethasone (DECADRON) injection 10 mg  . lidocaine (PF) (XYLOCAINE) 1 % injection 10 mL   Medications administered: We administered iopamidol.  See the medical record for exact dosing, route, and time of administration.  This SmartLink is deprecated. Use AVSMEDLIST instead to display the medication list for a patient. Disposition: Discharge home  Discharge Date & Time: 06/05/2017; 1135 hrs.   Physician-requested Follow-up: Return in about 4 weeks (around 07/03/2017) for Post Procedure Evaluation. Future Appointments  Date Time Provider [place]  06/06/2017  9:30 AM Flora Lipps, MD [facility] None  07/04/2017 10:15 AM Gillis Santa, MD [facility] None  11/08/2017  9:30 AM Glean Hess, MD [facility] None   Primary Care Physician: Glean Hess, MD Location: [facility] Note by: Gillis Santa, MD Date: 06/05/2017; Time: 12:14 PM  Disclaimer:  Medicine is not an exact science. The only guarantee in medicine is that nothing is guaranteed. It is important to note that the decision to proceed with this intervention was based on the information collected  from the patient. The Data and conclusions were drawn from the patient's questionnaire, the interview, and the physical examination. Because the information was provided in large part by the patient, it cannot be guaranteed that it has not been purposely or unconsciously manipulated. Every effort has been made to obtain as much relevant data as possible for this evaluation. It is important to note that the conclusions that lead to this procedure are derived in large part from the available data. Always take into account that the treatment will also be dependent on availability of resources and existing treatment guidelines, considered by other Pain Management Practitioners as being common knowledge and practice, at the time of the intervention. For Medico-Legal purposes, it is also important to point out that variation in procedural techniques and pharmacological choices are the acceptable norm. The indications, contraindications, technique, and results of the above procedure should only be interpreted and judged by a Board-Certified Interventional Pain Specialist with extensive familiarity and expertise in the same exact procedure and technique.

## 2017-06-06 ENCOUNTER — Telehealth: Payer: Self-pay | Admitting: *Deleted

## 2017-06-06 ENCOUNTER — Encounter: Payer: Self-pay | Admitting: Internal Medicine

## 2017-06-06 ENCOUNTER — Ambulatory Visit (INDEPENDENT_AMBULATORY_CARE_PROVIDER_SITE_OTHER): Payer: 59 | Admitting: Internal Medicine

## 2017-06-06 ENCOUNTER — Other Ambulatory Visit
Admission: RE | Admit: 2017-06-06 | Discharge: 2017-06-06 | Disposition: A | Discharge status: Home / Self Care | Payer: 59 | Source: Ambulatory Visit | Attending: Internal Medicine | Admitting: Internal Medicine

## 2017-06-06 VITALS — BP 126/86 | HR 120 | Ht 66.0 in | Wt 232.0 lb

## 2017-06-06 DIAGNOSIS — R252 Cramp and spasm: Secondary | ICD-10-CM | POA: Diagnosis not present

## 2017-06-06 DIAGNOSIS — J4520 Mild intermittent asthma, uncomplicated: Secondary | ICD-10-CM | POA: Diagnosis not present

## 2017-06-06 LAB — COMPREHENSIVE METABOLIC PANEL
ALBUMIN: 4.1 g/dL (ref 3.5–5.0)
ALT: 17 U/L (ref 14–54)
ANION GAP: 9 (ref 5–15)
AST: 25 U/L (ref 15–41)
Alkaline Phosphatase: 72 U/L (ref 38–126)
BUN: 25 mg/dL — ABNORMAL HIGH (ref 6–20)
CHLORIDE: 105 mmol/L (ref 101–111)
CO2: 24 mmol/L (ref 22–32)
Calcium: 9.8 mg/dL (ref 8.9–10.3)
Creatinine, Ser: 1.05 mg/dL — ABNORMAL HIGH (ref 0.44–1.00)
GFR calc Af Amer: >60 mL/min (ref >60)
GFR calc non Af Amer: 53 mL/min — ABNORMAL LOW (ref >60)
GLUCOSE: 154 mg/dL — AB (ref 65–99)
POTASSIUM: 4.4 mmol/L (ref 3.5–5.1)
Sodium: 138 mmol/L (ref 135–145)
TOTAL PROTEIN: 6.9 g/dL (ref 6.5–8.1)
Total Bilirubin: 0.6 mg/dL (ref 0.3–1.2)

## 2017-06-06 NOTE — Telephone Encounter (Signed)
No problems post procedure. 

## 2017-06-06 NOTE — Patient Instructions (Signed)
Check CMP, CA, VIT D levels

## 2017-06-06 NOTE — Progress Notes (Signed)
Name: Audrey Martinez MRN: [id number] DOB: Jun 19, 1945     CONSULTATION DATE:06/06/2017  REFERRING MD :  Dr Rockey Situ  CHIEF COMPLAINT:SOB  STUDIES:  NO CXR at this time   HISTORY OF PRESENT ILLNESS:  72 year old white female seen today for assessment for her underlying asthma Has restarted Flovent and Serevent and doing really well No acute asthma Her wheezing has improved However, has muscle cramps with inhaler regimen according to patient   PREVIOUS ASTHMA HISTORY Patient has had a chronic cough daily for the past several years Cold air and moisture aggravate her cough Strong perfumes and smoke also aggravate her cough  Patient has chronic nasal congestion she has tried Zyrtec and Claritin and did not work she has also tried Designer, industrial/product and did not work  Patient was started on [employer] and Serevent and this has helped her symptoms in the past but some reason she was switched over to Advair and the Advair has not helped her at all  Patient does not take any aspirin due to the fact that she states that she has an aspirin sensitivity that triggers her asthma  At this time there is no signs of infection at this time No signs of acute asthma exacerbation at this time She has no signs of acute heart failure at this time  REVIEW OF SYSTEMS:   Constitutional: Negative for fever, chills, weight loss, malaise/fatigue and diaphoresis.  HENT: Negative for hearing loss, ear pain, nosebleeds, congestion, sore throat, neck pain, tinnitus and ear discharge.   Eyes: Negative for blurred vision, double vision, photophobia, pain, discharge and redness.  Respiratory: +cough, hemoptysis, sputum production, shortness of breath, wheezing and stridor.   Cardiovascular: Negative for chest pain, palpitations, orthopnea, claudication, leg swelling and PND.  Gastrointestinal: Negative for heartburn, nausea, vomiting, abdominal pain, diarrhea, constipation, blood in stool and melena.  Genitourinary:  Negative for dysuria, urgency, frequency, hematuria and flank pain.  Musculoskeletal: Negative for myalgias, back pain, joint pain and falls.  Skin: Negative for itching and rash.  Neurological: Negative for dizziness, tingling, tremors, sensory change, speech change, focal weakness, seizures, loss of consciousness, weakness and headaches.  Endo/Heme/Allergies: Negative for environmental allergies and polydipsia. Does not bruise/bleed easily.  ALL OTHER ROS ARE NEGATIVE    VITAL SIGNS: BP 126/86 (BP Location: Left Arm, Cuff Size: Normal)   Pulse (!) 120   Ht 5\' 6"  (1.676 m)   Wt 232 lb (105.2 kg)   SpO2 97%   BMI 37.45 kg/m    Physical Examination:   GENERAL:NAD, no fevers, chills, no weakness no fatigue HEAD: Normocephalic, atraumatic.  EYES: Pupils equal, round, reactive to light. Extraocular muscles intact. No scleral icterus.  MOUTH: Moist mucosal membrane.   EAR, NOSE, THROAT: Clear without exudates. No external lesions.  NECK: Supple. No thyromegaly. No nodules. No JVD.  PULMONARY:CTA B/L no wheezes, no crackles, no rhonchi CARDIOVASCULAR: S1 and S2. Regular rate and rhythm. No murmurs, rubs, or gallops. No edema.  MUSCULOSKELETAL: No swelling, clubbing, or edema. Range of motion full in all extremities.  NEUROLOGIC: Cranial nerves II through XII are intact. No gross focal neurological deficits.  SKIN: No ulceration, lesions, rashes, or cyanosis. Skin warm and dry. Turgor intact.  PSYCHIATRIC: Mood, affect within normal limits. The patient is awake, alert and oriented x 3. Insight, judgment intact.      ASSESSMENT / PLAN: 72 year old pleasant white female with a history of SVT seen today for assessment of her reactive airways disease most likely related to mild intermittent  asthma which is well controlled at this time   At this time I would recommend continuingFlovent and Serevent which has worked in the past. And will check labs for electrolyte imbalance for  intermittent muscle cramps  I have educated the patient about rinsing her mouth after each use I have also educated the patient to avoid triggers   Patient has not had any acute asthma  exacerbations in the past 9 months   Patient satisfied with Plan of action and management. All questions answered Follow up in 3 months  Tai Syfert Patricia Pesa, M.D.  [facility]  Medical Director [place] Director [facility] Cardio-Pulmonary Department

## 2017-06-07 LAB — MISC LABCORP TEST (SEND OUT): Labcorp test code: 81091

## 2017-06-27 ENCOUNTER — Ambulatory Visit (INDEPENDENT_AMBULATORY_CARE_PROVIDER_SITE_OTHER): Payer: 59 | Admitting: Internal Medicine

## 2017-06-27 ENCOUNTER — Encounter: Payer: Self-pay | Admitting: Internal Medicine

## 2017-06-27 ENCOUNTER — Ambulatory Visit
Admission: RE | Admit: 2017-06-27 | Discharge: 2017-06-27 | Disposition: A | Discharge status: Home / Self Care | Payer: 59 | Source: Ambulatory Visit | Attending: Internal Medicine | Admitting: Internal Medicine

## 2017-06-27 VITALS — BP 124/84 | HR 107 | Ht 66.0 in | Wt 232.0 lb

## 2017-06-27 DIAGNOSIS — Z96651 Presence of right artificial knee joint: Secondary | ICD-10-CM | POA: Diagnosis not present

## 2017-06-27 DIAGNOSIS — M25561 Pain in right knee: Secondary | ICD-10-CM | POA: Diagnosis present

## 2017-06-27 NOTE — Progress Notes (Signed)
Date:  06/27/2017   Name:  Audrey Martinez   DOB:  23-Aug-1945   MRN:  [id number]   Chief Complaint: Leg Pain (Started a week ago. Feels Dull but when you push on it there is a shooting pain in thigh and knee. Have knee replacement in this knee. ) Knee Pain   There was no injury mechanism. The pain is present in the right knee. The quality of the pain is described as burning and stabbing. The pain is moderate. The pain has been fluctuating since onset. The symptoms are aggravated by palpation. She has tried nothing for the symptoms.  She has hx of TKR 7 years ago in [place] - done as WC.    Review of Systems  Constitutional: Negative for chills, fatigue and fever.  Respiratory: Negative for chest tightness, shortness of breath and wheezing.   Cardiovascular: Negative for chest pain, palpitations and leg swelling.  Musculoskeletal: Positive for arthralgias and myalgias.    Patient Active Problem List   Diagnosis Date Noted  . Lumbar radiculopathy 05/18/2017  . Status post hemilaminotomy 05/18/2017  . Lumbar degenerative disc disease 05/18/2017  . Spinal stenosis of lumbar region without neurogenic claudication 05/18/2017  . Atrial tachycardia ([place]) 04/14/2017  . SVT (supraventricular tachycardia) ([place]) 04/14/2017  . Essential hypertension 04/14/2017  . Reactive airway disease 04/14/2017  . Osteoporosis 03/21/2017  . Benign essential tremor 03/21/2017  . Obesity 09/14/2011  . Hyperlipidemia 05/12/2003  . Personal history of transient ischemic attack (TIA), and cerebral infarction without residual deficits 04/22/2003    Prior to Admission medications   Medication Sig Start Date End Date Taking? Authorizing Provider  albuterol (PROVENTIL HFA;VENTOLIN HFA) 108 (90 Base) MCG/ACT inhaler Inhale 2 puffs into the lungs every 6 (six) hours as needed for wheezing or shortness of breath. 03/21/17   Glean Hess, MD  baclofen (LIORESAL) 10 MG tablet Take 10 mg by mouth 3 (three) times  daily.    [provider]  calcium-vitamin D (OSCAL WITH D) 500-200 MG-UNIT tablet Take 1 tablet by mouth.    [provider]  Cranberry 1000 MG CAPS Take by mouth.    [provider]  diltiazem (CARDIZEM CD) 180 MG 24 hr capsule Take 1 capsule (180 mg total) daily by mouth. 04/14/17   Gollan, Kathlene November, MD  diltiazem (CARDIZEM) 60 MG tablet Take 60 mg by mouth 4 (four) times daily.    [provider]  fluticasone (FLOVENT HFA) 110 MCG/ACT inhaler Inhale 2 puffs into the lungs 2 (two) times daily. 05/29/17 05/29/18  Flora Lipps, MD  gabapentin (NEURONTIN) 300 MG capsule Take 300 mg by mouth 3 (three) times daily. Three capsules daily at bedtime.    [provider]  Ginger 500 MG CAPS Take by mouth.    [provider]  Misc Natural Products (TART CHERRY ADVANCED PO) Take by mouth.    [provider]  Multiple Vitamins-Minerals (MULTIVITAMIN WITH MINERALS) tablet Take 1 tablet by mouth daily.    [provider]  salmeterol (SEREVENT DISKUS) 50 MCG/DOSE diskus inhaler Inhale 1 puff into the lungs 2 (two) times daily. 04/25/17 04/25/18  Flora Lipps, MD  Turmeric 500 MG CAPS Take by mouth.    [provider]  vitamin E 100 UNIT capsule Take by mouth daily.    [provider]    Allergies  Allergen Reactions  . Aspirin Anaphylaxis  . Ciprofloxacin-Dexamethasone Anaphylaxis  . Crestor [Rosuvastatin] Other (See Comments)    Caused problems  with memory.  . Amoxicillin Hives  . Doxycycline Hyclate Hives  . Hydrochlorothiazide Other (See Comments)    Raises her blood pressure.   Marland Kitchen Keflex [Cephalexin] Hives  . Lovastatin Other (See Comments)    "Unable to function"  . Simvastatin Other (See Comments)    "Unable to function"  . Erythromycin Hives  . Ibuprofen Other (See Comments)    Sharp stomach pains  . Neomycin-Bacitracin Zn-Polymyx Hives and Rash  . Omeprazole Palpitations    Tachycardia and same  sensation as her SVTs per patient  . Other Hives and Rash  . Tape Hives and Rash    Past Surgical History:  Procedure Laterality Date  . BACK SURGERY  2017   L2-4 laminectomy and foraminal stenosis  . CARDIAC CATHETERIZATION  2009   [facility] in [place]  . CARDIAC ELECTROPHYSIOLOGY STUDY AND ABLATION  2009  . CATARACT EXTRACTION, BILATERAL  2010  . derrick procedure     removed parts of radius from both wrists  . ESOPHAGOGASTRODUODENOSCOPY  2013   gastritis; done for complaint of dysphagia  . lymph node removal     neck  . MOHS SURGERY  07/2016   [facility] nose  . REPLACEMENT TOTAL KNEE  2013   RT  . TOTAL SHOULDER REPLACEMENT  2012   RT     Social History   Tobacco Use  . Smoking status: Never Smoker  . Smokeless tobacco: Never Used  Substance Use Topics  . Alcohol use: No  . Drug use: No     Medication list has been reviewed and updated.  PHQ 2/9 Scores 05/18/2017 05/03/2017 03/21/2017  PHQ - 2 Score 0 0 0    Physical Exam  Constitutional: She is oriented to person, place, and time. She appears well-developed. No distress.  HENT:  Head: Normocephalic and atraumatic.  Pulmonary/Chest: Effort normal. No respiratory distress.  Musculoskeletal: She exhibits tenderness. She exhibits no edema.  Posterior and medial soft tissues of the knee are tender without mass, fluctuance, redness or warmth. Area is slightly swollen compared to the left.  Neurological: She is alert and oriented to person, place, and time.  Skin: Skin is warm and dry. No rash noted.  Psychiatric: She has a normal mood and affect. Her behavior is normal. Thought content normal.  Nursing note and vitals reviewed.   BP 124/84   Pulse (!) 107   Ht 5\' 6"  (1.676 m)   Wt 232 lb (105.2 kg)   SpO2 96%   BMI 37.45 kg/m   Assessment and Plan: 1. Acute pain of right knee S/p Right TKR Will get Xray to evaluate hardware and refer to Ortho - Ambulatory referral to Orthopedic  Surgery - DG Knee Complete 4 Views Right; Future   No orders of the defined types were placed in this encounter.   Partially dictated using Editor, commissioning. Any errors are unintentional.  Halina Maidens, MD [facility]  06/27/2017

## 2017-07-03 DIAGNOSIS — M76891 Other specified enthesopathies of right lower limb, excluding foot: Secondary | ICD-10-CM

## 2017-07-03 HISTORY — DX: Other specified enthesopathies of right lower limb, excluding foot: M76.891

## 2017-07-04 ENCOUNTER — Encounter: Payer: Self-pay | Admitting: Student in an Organized Health Care Education/Training Program

## 2017-07-04 ENCOUNTER — Other Ambulatory Visit: Payer: Self-pay

## 2017-07-04 ENCOUNTER — Ambulatory Visit
Payer: 59 | Attending: Student in an Organized Health Care Education/Training Program | Admitting: Student in an Organized Health Care Education/Training Program

## 2017-07-04 ENCOUNTER — Telehealth: Payer: Self-pay | Admitting: Cardiovascular Disease

## 2017-07-04 VITALS — BP 130/81 | HR 116 | Temp 98.0°F | Resp 16 | Ht 66.0 in | Wt 220.0 lb

## 2017-07-04 DIAGNOSIS — Z8673 Personal history of transient ischemic attack (TIA), and cerebral infarction without residual deficits: Secondary | ICD-10-CM | POA: Insufficient documentation

## 2017-07-04 DIAGNOSIS — Z885 Allergy status to narcotic agent status: Secondary | ICD-10-CM | POA: Insufficient documentation

## 2017-07-04 DIAGNOSIS — Z79899 Other long term (current) drug therapy: Secondary | ICD-10-CM | POA: Diagnosis not present

## 2017-07-04 DIAGNOSIS — Z9889 Other specified postprocedural states: Secondary | ICD-10-CM | POA: Diagnosis not present

## 2017-07-04 DIAGNOSIS — Z881 Allergy status to other antibiotic agents status: Secondary | ICD-10-CM | POA: Diagnosis not present

## 2017-07-04 DIAGNOSIS — M5136 Other intervertebral disc degeneration, lumbar region: Secondary | ICD-10-CM

## 2017-07-04 DIAGNOSIS — Z886 Allergy status to analgesic agent status: Secondary | ICD-10-CM | POA: Insufficient documentation

## 2017-07-04 DIAGNOSIS — M5416 Radiculopathy, lumbar region: Secondary | ICD-10-CM | POA: Diagnosis not present

## 2017-07-04 DIAGNOSIS — M545 Low back pain: Secondary | ICD-10-CM | POA: Diagnosis present

## 2017-07-04 DIAGNOSIS — M48061 Spinal stenosis, lumbar region without neurogenic claudication: Secondary | ICD-10-CM | POA: Diagnosis not present

## 2017-07-04 DIAGNOSIS — E669 Obesity, unspecified: Secondary | ICD-10-CM | POA: Insufficient documentation

## 2017-07-04 DIAGNOSIS — Z888 Allergy status to other drugs, medicaments and biological substances status: Secondary | ICD-10-CM | POA: Insufficient documentation

## 2017-07-04 DIAGNOSIS — Z880 Allergy status to penicillin: Secondary | ICD-10-CM | POA: Insufficient documentation

## 2017-07-04 DIAGNOSIS — Z833 Family history of diabetes mellitus: Secondary | ICD-10-CM | POA: Diagnosis not present

## 2017-07-04 DIAGNOSIS — M5116 Intervertebral disc disorders with radiculopathy, lumbar region: Secondary | ICD-10-CM | POA: Diagnosis not present

## 2017-07-04 DIAGNOSIS — E785 Hyperlipidemia, unspecified: Secondary | ICD-10-CM | POA: Insufficient documentation

## 2017-07-04 DIAGNOSIS — Z823 Family history of stroke: Secondary | ICD-10-CM | POA: Insufficient documentation

## 2017-07-04 DIAGNOSIS — M51369 Other intervertebral disc degeneration, lumbar region without mention of lumbar back pain or lower extremity pain: Secondary | ICD-10-CM

## 2017-07-04 NOTE — Progress Notes (Signed)
Patient's Name: Audrey Martinez  MRN: [id number]  Referring Provider: Glean Hess, MD  DOB: 1945/09/24  PCP: Glean Hess, MD  DOS: 07/04/2017  Note by: Gillis Santa, MD  Service setting: Ambulatory outpatient  Specialty: Interventional Pain Management  Location: [facility] (AMB) Pain Management Facility    Patient type: Established   Primary Reason(s) for Visit: Encounter for post-procedure evaluation of chronic illness with mild to moderate exacerbation CC: Back Pain (lower)  HPI  Audrey Martinez is a 72 y.o. year old, female patient, who comes today for a post-procedure evaluation. She has Osteoporosis; Personal history of transient ischemic attack (TIA), and cerebral infarction without residual deficits; Hyperlipidemia; Obesity; Benign essential tremor; Atrial tachycardia ([place]); SVT (supraventricular tachycardia) ([place]); Essential hypertension; Reactive airway disease; Lumbar radiculopathy; Status post hemilaminotomy; Lumbar degenerative disc disease; and Spinal stenosis of lumbar region without neurogenic claudication on their problem list. Her primarily concern today is the Back Pain (lower)  Pain Assessment: Location: Lower Back Radiating: radiates down side of leg to foot and all toes Onset: More than a month ago Duration: Chronic pain Quality: Constant, Tingling, Stabbing Severity: 0-No pain(10 when she does have pain)/10 (self-reported pain score)  Note: Reported level is compatible with observation.                         When using our objective Pain Scale, levels between 6 and 10/10 are said to belong in an emergency room, as it progressively worsens from a 6/10, described as severely limiting, requiring emergency care not usually available at an outpatient pain management facility. At a 6/10 level, communication becomes difficult and requires great effort. Assistance to reach the emergency department may be required. Facial flushing and profuse sweating along with potentially  dangerous increases in heart rate and blood pressure will be evident. Effect on ADL: prolonged standing Timing:   Modifying factors: rest, sit, TENS  Audrey Martinez comes in today for post-procedure evaluation after the treatment done on 06/05/2017.  Further details on both, my assessment(s), as well as the proposed treatment plan, please see below.  Post-Procedure Assessment  06/05/2017 Procedure: Left L4/5 ESI Pre-procedure pain score:  2/10 Post-procedure pain score: 0/10         Influential Factors: BMI: 35.51 kg/m Intra-procedural challenges: None observed.         Assessment challenges: None detected.              Reported side-effects: None.        Post-procedural adverse reactions or complications: None reported         Sedation: Please see nurses note. When no sedatives are used, the analgesic levels obtained are directly associated to the effectiveness of the local anesthetics. However, when sedation is provided, the level of analgesia obtained during the initial 1 hour following the intervention, is believed to be the result of a combination of factors. These factors may include, but are not limited to: 1. The effectiveness of the local anesthetics used. 2. The effects of the analgesic(s) and/or anxiolytic(s) used. 3. The degree of discomfort experienced by the patient at the time of the procedure. 4. The patients ability and reliability in recalling and recording the events. 5. The presence and influence of possible secondary gains and/or psychosocial factors. Reported result: Relief experienced during the 1st hour after the procedure: 70 % (Ultra-Short Term Relief)            Interpretative annotation: Clinically appropriate result. Analgesia during this  period is likely to be Local Anesthetic and/or IV Sedative (Analgesic/Anxiolytic) related.          Effects of local anesthetic: The analgesic effects attained during this period are directly associated to the localized  infiltration of local anesthetics and therefore cary significant diagnostic value as to the etiological location, or anatomical origin, of the pain. Expected duration of relief is directly dependent on the pharmacodynamics of the local anesthetic used. Long-acting (4-6 hours) anesthetics used.  Reported result: Relief during the next 4 to 6 hour after the procedure: 70 % (Short-Term Relief)            Interpretative annotation: Clinically appropriate result. Analgesia during this period is likely to be Local Anesthetic-related.          Long-term benefit: Defined as the period of time past the expected duration of local anesthetics (1 hour for short-acting and 4-6 hours for long-acting). With the possible exception of prolonged sympathetic blockade from the local anesthetics, benefits during this period are typically attributed to, or associated with, other factors such as analgesic sensory neuropraxia, antiinflammatory effects, or beneficial biochemical changes provided by agents other than the local anesthetics.  Reported result: Extended relief following procedure: 0 %(5 days) (Long-Term Relief)            Interpretative annotation: Clinically appropriate result. Partial relief. No permanent benefit expected. Inflammation plays a part in the etiology to the pain.          Current benefits: Defined as reported results that persistent at this point in time.   Analgesia: <25 %            Function: Back to baseline ROM: Back to baseline Interpretative annotation: Recurrence of symptoms. No permanent benefit expected. Results would argue against repeating therapy.          Interpretation: Results would suggest failure of therapy in achieving desired goal(s).                  Plan:  Please see "Plan of Care" for details.        Laboratory Chemistry  Inflammation Markers (CRP: Acute Phase) (ESR: Chronic Phase) No results found for: CRP, ESRSEDRATE, LATICACIDVEN               Rheumatology  Markers No results found for: RF, ANA, LABURIC, URICUR, LYMEIGGIGMAB, LYMEABIGMQN              Renal Function Markers Lab Results  Component Value Date   BUN 25 (H) 06/06/2017   CREATININE 1.05 (H) 06/06/2017   GFRAA >60 06/06/2017   GFRNONAA 53 (L) 06/06/2017                 Hepatic Function Markers Lab Results  Component Value Date   AST 25 06/06/2017   ALT 17 06/06/2017   ALBUMIN 4.1 06/06/2017   ALKPHOS 72 06/06/2017                 Electrolytes Lab Results  Component Value Date   NA 138 06/06/2017   K 4.4 06/06/2017   CL 105 06/06/2017   CALCIUM 9.8 06/06/2017                 Neuropathy Markers No results found for: VITAMINB12, FOLATE, HGBA1C, HIV               Bone Pathology Markers No results found for: [place], [id number], [id number], [id number], 25OHVITD1, 25OHVITD2, 25OHVITD3, TESTOFREE, TESTOSTERONE  Coagulation Parameters No results found for: INR, LABPROT, APTT, PLT, DDIMER               Cardiovascular Markers No results found for: BNP, CKTOTAL, CKMB, TROPONINI, HGB, HCT               CA Markers No results found for: CEA, CA125, LABCA2               Note: Lab results reviewed.  Recent Diagnostic Imaging Results  DG Knee Complete 4 Views Right CLINICAL DATA:  Right knee pain.  Status post fall.  EXAM: RIGHT KNEE - COMPLETE 4+ VIEW  COMPARISON:  None.  FINDINGS: The right knee demonstrates a total knee arthroplasty without evidence of hardware failure complication. There is no significant joint effusion. There is no fracture or dislocation. The alignment is anatomic.  IMPRESSION: Right total knee arthroplasty without failure or complication.  Electronically Signed   By: Kathreen Devoid   On: 06/27/2017 15:01  Complexity Note: Imaging results reviewed. Results shared with Ms. Mancel Bale, using Layman's terms.                         Meds   Current Outpatient Medications:  .  baclofen (LIORESAL) 10 MG tablet, Take 10 mg by mouth  3 (three) times daily., Disp: , Rfl:  .  calcium-vitamin D (OSCAL WITH D) 500-200 MG-UNIT tablet, Take 1 tablet by mouth., Disp: , Rfl:  .  cholecalciferol (VITAMIN D) 400 units TABS tablet, Take 400 Units by mouth., Disp: , Rfl:  .  Cranberry 1000 MG CAPS, Take by mouth., Disp: , Rfl:  .  diltiazem (CARDIZEM CD) 180 MG 24 hr capsule, Take 1 capsule (180 mg total) daily by mouth., Disp: 90 capsule, Rfl: 4 .  diltiazem (CARDIZEM) 60 MG tablet, Take 60 mg by mouth 4 (four) times daily., Disp: , Rfl:  .  fluticasone (FLOVENT HFA) 110 MCG/ACT inhaler, Inhale 2 puffs into the lungs 2 (two) times daily., Disp: 3 Inhaler, Rfl: 1 .  gabapentin (NEURONTIN) 300 MG capsule, Take 300 mg by mouth 3 (three) times daily. Three capsules daily at bedtime., Disp: , Rfl:  .  Ginger 500 MG CAPS, Take by mouth., Disp: , Rfl:  .  Misc Natural Products (TART CHERRY ADVANCED PO), Take by mouth., Disp: , Rfl:  .  Multiple Vitamins-Minerals (MULTIVITAMIN WITH MINERALS) tablet, Take 1 tablet by mouth daily., Disp: , Rfl:  .  salmeterol (SEREVENT DISKUS) 50 MCG/DOSE diskus inhaler, Inhale 1 puff into the lungs 2 (two) times daily., Disp: 1 Inhaler, Rfl: 2 .  Turmeric 500 MG CAPS, Take by mouth., Disp: , Rfl:  .  vitamin E 100 UNIT capsule, Take by mouth daily., Disp: , Rfl:   ROS  Constitutional: Denies any fever or chills Gastrointestinal: No reported hemesis, hematochezia, vomiting, or acute GI distress Musculoskeletal: Denies any acute onset joint swelling, redness, loss of ROM, or weakness Neurological: No reported episodes of acute onset apraxia, aphasia, dysarthria, agnosia, amnesia, paralysis, loss of coordination, or loss of consciousness  Allergies  Ms. Gentles is allergic to aspirin; ciprofloxacin-dexamethasone; crestor [rosuvastatin]; amoxicillin; doxycycline hyclate; hydrochlorothiazide; keflex [cephalexin]; lovastatin; morphine and related; simvastatin; erythromycin; ibuprofen; neomycin-bacitracin zn-polymyx;  omeprazole; other; and tape.  [place]  Drug: Ms. Ra  reports that she does not use drugs. Alcohol:  reports that she does not drink alcohol. Tobacco:  reports that  has never smoked. she has never used smokeless tobacco. Medical:  has a  past medical history of Asthma, Herniated disc, cervical, Left wrist fracture, Stroke ([place]) (1998), and SVT (supraventricular tachycardia) ([place]). Surgical: Ms. Wilton  has a past surgical history that includes Cardiac electrophysiology study and ablation (2009); Total shoulder replacement (2012); Replacement total knee (2013); Back surgery (2017); lymph node removal; Mohs surgery (07/2016); derrick procedure; Cataract extraction, bilateral (2010); Esophagogastroduodenoscopy (2013); and Cardiac catheterization (2009). Family: family history includes Diabetes in her mother; Stroke in her mother; Valvular heart disease in her father.  Constitutional Exam  General appearance: Well nourished, well developed, and well hydrated. In no apparent acute distress Vitals:   07/04/17 1028  BP: 130/81  Pulse: (!) 116  Resp: 16  Temp: 98 F (36.7 C)  SpO2: 96%  Weight: 220 lb (99.8 kg)  Height: 5' 6"  (1.676 m)   BMI Assessment: Estimated body mass index is 35.51 kg/m as calculated from the following:   Height as of this encounter: 5' 6"  (1.676 m).   Weight as of this encounter: 220 lb (99.8 kg).  BMI interpretation table: BMI level Category Range association with higher incidence of chronic pain  <18 kg/m2 Underweight   18.5-24.9 kg/m2 Ideal body weight   25-29.9 kg/m2 Overweight Increased incidence by 20%  30-34.9 kg/m2 Obese (Class I) Increased incidence by 68%  35-39.9 kg/m2 Severe obesity (Class II) Increased incidence by 136%  >40 kg/m2 Extreme obesity (Class III) Increased incidence by 254%   BMI Readings from Last 4 Encounters:  07/04/17 35.51 kg/m  06/27/17 37.45 kg/m  06/06/17 37.45 kg/m  06/05/17 35.51 kg/m   Wt Readings from Last 4  Encounters:  07/04/17 220 lb (99.8 kg)  06/27/17 232 lb (105.2 kg)  06/06/17 232 lb (105.2 kg)  06/05/17 220 lb (99.8 kg)  Psych/Mental status: Alert, oriented x 3 (person, place, & time)       Eyes: PERLA Respiratory: No evidence of acute respiratory distress  Cervical Spine Area Exam  Skin & Axial Inspection: No masses, redness, edema, swelling, or associated skin lesions Alignment: Symmetrical Functional ROM: Unrestricted ROM      Stability: No instability detected Muscle Tone/Strength: Functionally intact. No obvious neuro-muscular anomalies detected. Sensory (Neurological): Unimpaired Palpation: No palpable anomalies              Upper Extremity (UE) Exam    Side: Right upper extremity  Side: Left upper extremity  Skin & Extremity Inspection: Skin color, temperature, and hair growth are WNL. No peripheral edema or cyanosis. No masses, redness, swelling, asymmetry, or associated skin lesions. No contractures.  Skin & Extremity Inspection: Skin color, temperature, and hair growth are WNL. No peripheral edema or cyanosis. No masses, redness, swelling, asymmetry, or associated skin lesions. No contractures.  Functional ROM: Unrestricted ROM          Functional ROM: Unrestricted ROM          Muscle Tone/Strength: Functionally intact. No obvious neuro-muscular anomalies detected.  Muscle Tone/Strength: Functionally intact. No obvious neuro-muscular anomalies detected.  Sensory (Neurological): Unimpaired          Sensory (Neurological): Unimpaired          Palpation: No palpable anomalies              Palpation: No palpable anomalies              Specialized Test(s): Deferred         Specialized Test(s): Deferred          Thoracic Spine Area Exam  Skin & Axial Inspection: No  masses, redness, or swelling Alignment: Symmetrical Functional ROM: Unrestricted ROM Stability: No instability detected Muscle Tone/Strength: Functionally intact. No obvious neuro-muscular anomalies  detected. Sensory (Neurological): Unimpaired Muscle strength & Tone: No palpable anomalies    Lumbar Spine Area Exam  Skin & Axial Inspection:Well healed scar from previous spine surgery detected Alignment:Symmetrical Functional MWU:[id number] ROM, bilaterally Stability:No instability detected Muscle Tone/Strength:Functionally intact. No obvious neuro-muscular anomalies detected. Sensory (Neurological):Dermatomal pain patternand musculoskeletal pain referral pattern into bilateral buttocks.  Improved after treatment. Palpation:Complains of area being tender to palpationBilateral Fist Percussion Test Provocative Tests: Lumbar Hyperextension and rotation test:Positivebilaterally for facet joint pain.left greater than right.  Improved after treatment Lumbar Lateral bending test:Positiveipsilateral radicular pain, on the left. Positive for left-sided foraminal stenosis.  Improved after treatment Patrick's Maneuver:evaluation deferred today  Gait & Posture Assessment  Ambulation:Unassisted Gait:Relatively normal for age and body habitus Posture:WNL  Lower Extremity Exam    Side:Right lower extremity  Side:Left lower extremity  Skin & Extremity Inspection:Skin color, temperature, and hair growth are WNL. No peripheral edema or cyanosis. No masses, redness, swelling, asymmetry, or associated skin lesions. No contractures.  Skin & Extremity Inspection:Skin color, temperature, and hair growth are WNL. No peripheral edema or cyanosis. No masses, redness, swelling, asymmetry, or associated skin lesions. No contractures.  Functional VOZ:[id number] ROM  Functional ZDG:[id number] ROM  Muscle Tone/Strength:Functionally intact. No obvious neuro-muscular anomalies detected.  Muscle Tone/Strength:Functionally intact. No obvious neuro-muscular anomalies detected.  Sensory (Neurological):Unimpaired  Sensory (Neurological):Unimpaired   Palpation:No palpable anomalies  Palpation:No palpable anomalies     Assessment  Primary Diagnosis & Pertinent Problem List: The primary encounter diagnosis was Lumbar radiculopathy. Diagnoses of Status post hemilaminotomy, Lumbar degenerative disc disease, and Spinal stenosis of lumbar region without neurogenic claudication were also pertinent to this visit.  Status Diagnosis  Persistent Persistent Persistent 1. Lumbar radiculopathy   2. Status post hemilaminotomy   3. Lumbar degenerative disc disease   4. Spinal stenosis of lumbar region without neurogenic claudication      72 year old female with a history of left L2/3, L3/4 hemilaminectomy in August 2017 status post a fall who presents with worsening bilateral leg numbness, left greater than right with repeat lumbar MRI obtained from neurosurgery showing small left L3-L4 subarticular disc extrusion, degenerative changes of lumbar spine along with moderate canal stenosis at L4-L5,and neuroforaminal narrowing L2/3 through L5/S1 which is moderate to severe on the left.   Patient is status post left L4-L5 ESI on May 03, 2017 and now another repeat ESI performed on June 05, 2017 who presents for follow-up.  Patient states that this last epidural steroid injection was not effective for her radicular symptoms.  She noted moderate pain relief for approximately 4-5 days but then return of pain after that.  At this point she is not interested in repeating additional epidural steroid injections.  She states that she would like to go see neurosurgery again to discuss surgical options.  Patient can follow-up with me as needed.   Provider-requested follow-up: Return if symptoms worsen or fail to improve. Time Note: Greater than 50% of the 15 minute(s) of face-to-face time spent with Ms. Labree, was spent in counseling/coordination of care regarding: the treatment plan, treatment alternatives, the results, interpretation and significance  of  her recent diagnostic interventional treatment(s) and the goals of pain management (increased in functionality). Future Appointments  Date Time Provider [place]  11/08/2017  9:30 AM Glean Hess, MD [facility] None    Primary Care Physician: Glean Hess, MD Location: [facility]  Outpatient Pain Management Facility Note by: Gillis Santa, M.D Date: 07/04/2017; Time: 11:18 AM  There are no Patient Instructions on file for this visit.

## 2017-07-04 NOTE — Telephone Encounter (Signed)
Patient calling to state that she received a call from a lady telling her there was a recall on ditiazem and that she needed to pick up her new prescription - she has not heard of any such recall Please call to discuss and confirm if there was any changes to medication

## 2017-07-04 NOTE — Telephone Encounter (Signed)
Spoke with patient and she reports that she received a call from someone stating that her medication has been recalled and she needs to call them. Reviewed medications and let her know that I am not aware of any recall on the diltiazem. Recommended that she call her pharmacy to verify with them and if she should have any further questions to give [place] a call back. She was appreciative for the call with no further questions at this time.

## 2017-07-04 NOTE — Progress Notes (Signed)
Safety precautions to be maintained throughout the outpatient stay will include: orient to surroundings, keep bed in low position, maintain call bell within reach at all times, provide assistance with transfer out of bed and ambulation.  

## 2017-07-14 ENCOUNTER — Other Ambulatory Visit: Payer: Self-pay | Admitting: *Deleted

## 2017-07-14 MED ORDER — SALMETEROL XINAFOATE 50 MCG/DOSE IN AEPB: 1.0000 | INHALATION_SPRAY | Freq: Two times a day (BID) | RESPIRATORY_TRACT | 3 refills | Status: DC

## 2017-07-19 ENCOUNTER — Telehealth: Payer: Self-pay | Admitting: Internal Medicine

## 2017-07-19 MED ORDER — SALMETEROL XINAFOATE 50 MCG/DOSE IN AEPB: 1.0000 | INHALATION_SPRAY | Freq: Two times a day (BID) | RESPIRATORY_TRACT | 2 refills | Status: DC

## 2017-07-19 NOTE — Telephone Encounter (Signed)
Pt.notified

## 2017-07-19 NOTE — Telephone Encounter (Signed)
Pt calling   *STAT* If patient is at the pharmacy, call can be transferred to refill team.   1. Which medications need to be refilled? (please list name of each medication and dose if known)  Serevent   2. Which pharmacy/location (including street and city if local pharmacy) is medication to be sent to? [facility]   3. Do they need a 30 day or 90 day supply? 90 day

## 2017-07-24 ENCOUNTER — Other Ambulatory Visit: Payer: Self-pay | Admitting: Internal Medicine

## 2017-07-24 ENCOUNTER — Telehealth: Payer: Self-pay

## 2017-07-24 MED ORDER — BACLOFEN 10 MG PO TABS: 10.0000 mg | ORAL_TABLET | Freq: Three times a day (TID) | ORAL | 1 refills | Status: DC

## 2017-07-24 NOTE — Telephone Encounter (Signed)
Patient called to request refills on baclofen 10 mg 3x daily. Wants 90 days. She was last given this by physician when she lived in [place]. Needs refill as she is almost out.

## 2017-08-28 ENCOUNTER — Ambulatory Visit (INDEPENDENT_AMBULATORY_CARE_PROVIDER_SITE_OTHER): Payer: 59 | Admitting: Internal Medicine

## 2017-08-28 ENCOUNTER — Encounter: Payer: Self-pay | Admitting: Internal Medicine

## 2017-08-28 VITALS — BP 140/70 | HR 95 | Ht 66.0 in | Wt 233.0 lb

## 2017-08-28 DIAGNOSIS — J4521 Mild intermittent asthma with (acute) exacerbation: Secondary | ICD-10-CM | POA: Diagnosis not present

## 2017-08-28 MED ORDER — PREDNISONE 20 MG PO TABS: 20.0000 mg | ORAL_TABLET | Freq: Every day | ORAL | 1 refills | Status: DC

## 2017-08-28 NOTE — Progress Notes (Signed)
   Name: Nyazia Canevari MRN: [id number] DOB: 05-27-1946     CONSULTATION DATE:08/28/2017  REFERRING MD :  Dr Rockey Situ  CHIEF COMPLAINT:SOB  STUDIES:  NO CXR at this time   HISTORY OF PRESENT ILLNESS:  72 year old white female seen today for assessment for her underlying asthma On  Flovent and Serevent and doing really well +cough for last several weeks Mucus production Feels lethargic at times No fevers, no chills NO resp distress  CHEM 7 reviewed with patient-WNL   PREVIOUS ASTHMA HISTORY chronic cough daily for the past several years Aggravating factors -Cold air and moisture,Strong perfumes, smoke   chronic nasal congestion she has tried Zyrtec and Claritin and did not work she has also tried Designer, industrial/product and did not work  Patient was started on [employer] and Serevent and this has helped her symptoms in the past but some reason she was switched over to Advair and the Advair has not helped her at all  Patient does not take any aspirin due to the fact that she states that she has an aspirin sensitivity that triggers her asthma  At this time there is no signs of infection at this time No signs of acute asthma exacerbation at this time She has no signs of acute heart failure at this time  REVIEW OF SYSTEMS:   Constitutional: Negative for fever, chills, weight loss, malaise/fatigue and diaphoresis.  HENT: Negative for hearing loss, ear pain, nosebleeds, congestion, sore throat, neck pain, tinnitus and ear discharge.   Eyes: Negative for blurred vision, double vision, photophobia, pain, discharge and redness.  Respiratory: +cough, hemoptysis, sputum production, shortness of breath, wheezing and stridor.   Cardiovascular: Negative for chest pain, palpitations, orthopnea, claudication, leg swelling and PND.  ALL OTHER ROS ARE NEGATIVE   BP 140/70 (BP Location: Left Arm, Cuff Size: Normal)   Pulse 95   Ht 5\' 6"  (1.676 m)   Wt 233 lb (105.7 kg)   SpO2 90%   BMI 37.61 kg/m      Physical Examination:   GENERAL:NAD, no fevers, chills, no weakness no fatigue HEAD: Normocephalic, atraumatic.  EYES: Pupils equal, round, reactive to light. Extraocular muscles intact. No scleral icterus.  MOUTH: Moist mucosal membrane.   EAR, NOSE, THROAT: Clear without exudates. No external lesions.  NECK: Supple. No thyromegaly. No nodules. No JVD.  PULMONARY:CTA B/L no wheezes, no crackles, no rhonchi CARDIOVASCULAR: S1 and S2. Regular rate and rhythm. No murmurs, rubs, or gallops. No edema.  SKIN: No ulceration, lesions, rashes, or cyanosis. Skin warm and dry. Turgor intact.  PSYCHIATRIC: Mood, affect within normal limits. The patient is awake, alert and oriented x 3. Insight, judgment intact.       ASSESSMENT / PLAN: 72 year old pleasant white female with a history of SVT seen today for assessment of her reactive airways disease most likely related to mild intermittent asthma with mild exacerbation at this time  At this time I would recommend continuing Flovent and Serevent I have educated the patient about rinsing her mouth after each use I have also educated the patient to avoid triggers Will prescribe 20 mg Prednisone for next 5 days and asked to re-assess her cough-most likely Viral bronchitis No ABX at this time  Patient satisfied with Plan of action and management. All questions answered Follow up in 3 months  Adreanne Yono Patricia Pesa, M.D.  [facility]  Medical Director [place] Director [facility] Cardio-Pulmonary Department

## 2017-08-28 NOTE — Patient Instructions (Signed)
PREDNISONE 20 MG DAILY FOR 5 days

## 2017-10-19 ENCOUNTER — Telehealth: Payer: Self-pay | Admitting: Internal Medicine

## 2017-10-19 MED ORDER — SALMETEROL XINAFOATE 50 MCG/DOSE IN AEPB: 1.0000 | INHALATION_SPRAY | Freq: Two times a day (BID) | RESPIRATORY_TRACT | 2 refills | Status: DC

## 2017-10-19 NOTE — Telephone Encounter (Signed)
Pt calling stating she is needing a refill but would like to get an inhaler version of it  Serevent Diskus, she states what we have her on now it is causing her issues with her teeth.   [facility] [place]   Please call back to let patient know if we can send in this script

## 2017-10-19 NOTE — Telephone Encounter (Signed)
Refill submitted appt rescheduled.

## 2017-10-27 ENCOUNTER — Other Ambulatory Visit: Payer: Self-pay | Admitting: Internal Medicine

## 2017-11-08 ENCOUNTER — Ambulatory Visit (INDEPENDENT_AMBULATORY_CARE_PROVIDER_SITE_OTHER): Payer: 59 | Admitting: Internal Medicine

## 2017-11-08 ENCOUNTER — Encounter: Payer: Self-pay | Admitting: Internal Medicine

## 2017-11-08 VITALS — BP 140/90 | HR 87 | Temp 98.7°F | Resp 16 | Ht 66.5 in | Wt 233.0 lb

## 2017-11-08 DIAGNOSIS — Z1231 Encounter for screening mammogram for malignant neoplasm of breast: Secondary | ICD-10-CM

## 2017-11-08 DIAGNOSIS — Z0001 Encounter for general adult medical examination with abnormal findings: Secondary | ICD-10-CM | POA: Diagnosis not present

## 2017-11-08 DIAGNOSIS — G250 Essential tremor: Secondary | ICD-10-CM | POA: Diagnosis not present

## 2017-11-08 DIAGNOSIS — N289 Disorder of kidney and ureter, unspecified: Secondary | ICD-10-CM

## 2017-11-08 DIAGNOSIS — I471 Supraventricular tachycardia: Secondary | ICD-10-CM | POA: Diagnosis not present

## 2017-11-08 DIAGNOSIS — E782 Mixed hyperlipidemia: Secondary | ICD-10-CM | POA: Diagnosis not present

## 2017-11-08 DIAGNOSIS — J4540 Moderate persistent asthma, uncomplicated: Secondary | ICD-10-CM | POA: Diagnosis not present

## 2017-11-08 DIAGNOSIS — I10 Essential (primary) hypertension: Secondary | ICD-10-CM

## 2017-11-08 DIAGNOSIS — Z1159 Encounter for screening for other viral diseases: Secondary | ICD-10-CM

## 2017-11-08 DIAGNOSIS — M79601 Pain in right arm: Secondary | ICD-10-CM | POA: Diagnosis not present

## 2017-11-08 DIAGNOSIS — N1832 Chronic kidney disease, stage 3b: Secondary | ICD-10-CM | POA: Insufficient documentation

## 2017-11-08 DIAGNOSIS — Z0000 Encounter for general adult medical examination without abnormal findings: Secondary | ICD-10-CM

## 2017-11-08 DIAGNOSIS — Z124 Encounter for screening for malignant neoplasm of cervix: Secondary | ICD-10-CM

## 2017-11-08 MED ORDER — GABAPENTIN 300 MG PO CAPS: 300.0000 mg | ORAL_CAPSULE | Freq: Three times a day (TID) | ORAL | 3 refills | Status: DC

## 2017-11-08 NOTE — Progress Notes (Signed)
Date:  11/08/2017   Name:  Audrey Martinez   DOB:  [birth year]   MRN:  [id number]   Chief Complaint: Annual Exam Audrey Martinez is a 72 y.o. female who presents today for her Complete Annual Exam. She feels well. She reports exercising walking. She reports she is sleeping fairly well.  She is due for a mammogram.  She is up to date on immunizations and colonoscopy done in 2018. She requests a Pap smear and pelvic exam today. She wants to defer labs until she gets part B medicare in July.  Hypertension  This is a chronic problem. The problem is controlled. Associated symptoms include palpitations. Pertinent negatives include no chest pain, headaches or shortness of breath. Past treatments include calcium channel blockers. There are no compliance problems.  Hypertensive end-organ damage includes CAD/MI (hx of SVT).  Asthma  She complains of wheezing. There is no cough or shortness of breath. This is a recurrent problem. The problem occurs intermittently. The problem has been unchanged. Associated symptoms include myalgias (right arm pain persistent since shoulder surgery). Pertinent negatives include no chest pain, fever, headaches or trouble swallowing. Her symptoms are alleviated by beta-agonist and steroid inhaler. She reports significant improvement on treatment. Her past medical history is significant for asthma.  Hyperlipidemia  This is a chronic problem. Associated symptoms include myalgias (right arm pain persistent since shoulder surgery). Pertinent negatives include no chest pain or shortness of breath. She is currently on no antihyperlipidemic treatment.   Lab Results  Component Value Date   CREATININE 1.05 (H) 06/06/2017   BUN 25 (H) 06/06/2017   NA 138 06/06/2017   K 4.4 06/06/2017   CL 105 06/06/2017   CO2 24 06/06/2017      Review of Systems  Constitutional: Negative for chills, fatigue and fever.  HENT: Negative for congestion, hearing loss, tinnitus, trouble  swallowing and voice change.   Eyes: Negative for visual disturbance.  Respiratory: Positive for wheezing. Negative for cough, chest tightness and shortness of breath.   Cardiovascular: Positive for palpitations. Negative for chest pain and leg swelling.  Gastrointestinal: Negative for abdominal pain, constipation, diarrhea and vomiting.  Endocrine: Negative for polydipsia and polyuria.  Genitourinary: Negative for dysuria, frequency, genital sores, vaginal bleeding and vaginal discharge.  Musculoskeletal: Positive for myalgias (right arm pain persistent since shoulder surgery). Negative for arthralgias, gait problem and joint swelling.  Skin: Negative for color change and rash.  Neurological: Negative for dizziness, tremors, light-headedness and headaches.  Hematological: Negative for adenopathy. Does not bruise/bleed easily.  Psychiatric/Behavioral: Negative for dysphoric mood and sleep disturbance. The patient is not nervous/anxious.     Patient Active Problem List   Diagnosis Date Noted  . Arm pain, right 11/08/2017  . Renal insufficiency 11/08/2017  . Tendinitis of right knee 07/03/2017  . Lumbar radiculopathy 05/18/2017  . Status post hemilaminotomy 05/18/2017  . Lumbar degenerative disc disease 05/18/2017  . Spinal stenosis of lumbar region without neurogenic claudication 05/18/2017  . Atrial tachycardia ([place]) 04/14/2017  . SVT (supraventricular tachycardia) ([place]) 04/14/2017  . Essential hypertension 04/14/2017  . Reactive airway disease 04/14/2017  . Osteoporosis 03/21/2017  . Benign essential tremor 03/21/2017  . Obesity 09/14/2011  . Hyperlipidemia 05/12/2003  . Personal history of transient ischemic attack (TIA), and cerebral infarction without residual deficits 04/22/2003    Prior to Admission medications   Medication Sig Start Date End Date Taking? Authorizing Provider  baclofen (LIORESAL) 10 MG tablet TAKE 1 TABLET BY MOUTH THREE TIMES  A DAY 10/27/17   Glean Hess, MD  calcium-vitamin D (OSCAL WITH D) 500-200 MG-UNIT tablet Take 1 tablet by mouth.    [provider]  cholecalciferol (VITAMIN D) 400 units TABS tablet Take 400 Units by mouth.    [provider]  Cranberry 1000 MG CAPS Take by mouth.    [provider]  diltiazem (CARDIZEM CD) 180 MG 24 hr capsule Take 1 capsule (180 mg total) daily by mouth. 04/14/17   Gollan, Kathlene November, MD  diltiazem (CARDIZEM) 60 MG tablet Take 60 mg by mouth 4 (four) times daily.    [provider]  fluticasone (FLOVENT HFA) 110 MCG/ACT inhaler Inhale 2 puffs into the lungs 2 (two) times daily. 05/29/17 05/29/18  Flora Lipps, MD  gabapentin (NEURONTIN) 300 MG capsule Take 300 mg by mouth 3 (three) times daily. Three capsules daily at bedtime.    [provider]  Ginger 500 MG CAPS Take by mouth.    [provider]  Misc Natural Products (TART CHERRY ADVANCED PO) Take by mouth.    [provider]  Multiple Vitamins-Minerals (MULTIVITAMIN WITH MINERALS) tablet Take 1 tablet by mouth daily.    [provider]  predniSONE (DELTASONE) 20 MG tablet Take 1 tablet (20 mg total) by mouth daily with breakfast. 10 days 08/28/17   Flora Lipps, MD  salmeterol (SEREVENT DISKUS) 50 MCG/DOSE diskus inhaler Inhale 1 puff into the lungs 2 (two) times daily. 10/19/17 10/19/18  Flora Lipps, MD  Turmeric 500 MG CAPS Take by mouth.    [provider]  vitamin E 100 UNIT capsule Take by mouth daily.    [provider]    Allergies  Allergen Reactions  . Aspirin Anaphylaxis  . Ciprofloxacin Rash  . Ciprofloxacin-Dexamethasone Anaphylaxis  . Crestor [Rosuvastatin] Other (See Comments)    Caused problems with memory.  . Morphine And Related Nausea Only  . Amoxicillin Hives  . Doxycycline Hyclate Hives  . Hydrochlorothiazide Other (See Comments)    Raises her blood pressure.   Marland Kitchen Keflex [Cephalexin] Hives  . Lovastatin Other (See Comments)     "Unable to function"  . Simvastatin Other (See Comments)    "Unable to function"  . Erythromycin Hives  . Ibuprofen Other (See Comments)    Sharp stomach pains  . Neomycin-Bacitracin Zn-Polymyx Hives and Rash  . Omeprazole Palpitations    Tachycardia and same sensation as her SVTs per patient  . Other Hives and Rash  . Tape Hives and Rash    Past Surgical History:  Procedure Laterality Date  . BACK SURGERY  2017   L2-4 laminectomy and foraminal stenosis  . CARDIAC CATHETERIZATION  2009   [facility] in [place]  . CARDIAC ELECTROPHYSIOLOGY STUDY AND ABLATION  2009  . CATARACT EXTRACTION, BILATERAL  2010  . derrick procedure  1977-78   bilateral   . ESOPHAGOGASTRODUODENOSCOPY  2013   gastritis; done for complaint of dysphagia  . lymph node removal     neck  . MOHS SURGERY  07/2016   [facility] nose  . REPLACEMENT TOTAL KNEE  2013   RT  . TOTAL SHOULDER REPLACEMENT  2012   RT     Social History   Tobacco Use  . Smoking status: Never Smoker  . Smokeless tobacco: Never Used  Substance Use Topics  . Alcohol use: No  . Drug use: No     Medication list has been reviewed and updated.  Current Meds  Medication Sig  .  baclofen (LIORESAL) 10 MG tablet TAKE 1 TABLET BY MOUTH THREE TIMES A DAY  . calcium-vitamin D (OSCAL WITH D) 500-200 MG-UNIT tablet Take 1 tablet by mouth.  . cholecalciferol (VITAMIN D) 400 units TABS tablet Take 400 Units by mouth.  . Cranberry 1000 MG CAPS Take by mouth.  . diltiazem (CARDIZEM CD) 180 MG 24 hr capsule Take 1 capsule (180 mg total) daily by mouth.  . diltiazem (CARDIZEM) 60 MG tablet Take 60 mg by mouth 4 (four) times daily.  . fluticasone (FLOVENT HFA) 110 MCG/ACT inhaler Inhale 2 puffs into the lungs 2 (two) times daily.  Marland Kitchen gabapentin (NEURONTIN) 300 MG capsule Take 1 capsule (300 mg total) by mouth 3 (three) times daily. Three capsules daily at bedtime.  . Ginger 500 MG CAPS Take by mouth.  . Misc Natural Products  (TART CHERRY ADVANCED PO) Take by mouth.  . Multiple Vitamins-Minerals (MULTIVITAMIN WITH MINERALS) tablet Take 1 tablet by mouth daily.  . NON FORMULARY Protleotic Enzymes  . Probiotic Product (PROBIOTIC DAILY PO) Take by mouth.  . salmeterol (SEREVENT DISKUS) 50 MCG/DOSE diskus inhaler Inhale 1 puff into the lungs 2 (two) times daily.  . Turmeric 500 MG CAPS Take by mouth.  . vitamin E 100 UNIT capsule Take by mouth daily.  . [DISCONTINUED] gabapentin (NEURONTIN) 300 MG capsule Take 300 mg by mouth 3 (three) times daily. Three capsules daily at bedtime.  . [DISCONTINUED] predniSONE (DELTASONE) 20 MG tablet Take 1 tablet (20 mg total) by mouth daily with breakfast. 10 days    PHQ 2/9 Scores 11/08/2017 07/04/2017 05/18/2017 05/03/2017  PHQ - 2 Score 0 0 0 0    Physical Exam  Constitutional: She is oriented to person, place, and time. She appears well-developed and well-nourished. No distress.  HENT:  Head: Normocephalic and atraumatic.  Right Ear: Tympanic membrane and ear canal normal.  Left Ear: Tympanic membrane and ear canal normal.  Nose: Right sinus exhibits no maxillary sinus tenderness. Left sinus exhibits no maxillary sinus tenderness.  Mouth/Throat: Uvula is midline and oropharynx is clear and moist.  Eyes: Conjunctivae and EOM are normal. Right eye exhibits no discharge. Left eye exhibits no discharge. No scleral icterus.  Neck: Normal range of motion. Carotid bruit is not present. No erythema present. No thyromegaly present.  Cardiovascular: Normal rate, regular rhythm, normal heart sounds and normal pulses.  Pulmonary/Chest: Effort normal. No respiratory distress. She has no wheezes. Right breast exhibits no mass, no nipple discharge, no skin change and no tenderness. Left breast exhibits no mass, no nipple discharge, no skin change and no tenderness.  Abdominal: Soft. Bowel sounds are normal. There is no hepatosplenomegaly. There is no tenderness. There is no CVA tenderness.    Genitourinary: Rectum normal, vagina normal and uterus normal. Rectal exam shows guaiac negative stool. There is no rash or lesion on the right labia. There is no rash, tenderness or lesion on the left labia. Cervix exhibits no motion tenderness, no discharge and no friability. Right adnexum displays no mass, no tenderness and no fullness. Left adnexum displays no mass, no tenderness and no fullness.  Genitourinary Comments: Vaginal atrophy - no prolapse  Musculoskeletal:       Right shoulder: She exhibits decreased range of motion and tenderness.  Lymphadenopathy:    She has no cervical adenopathy.    She has no axillary adenopathy.  Neurological: She is alert and oriented to person, place, and time. She has normal reflexes. She displays tremor. No cranial nerve deficit or  sensory deficit.  Skin: Skin is warm, dry and intact. No rash noted.  Psychiatric: She has a normal mood and affect. Her speech is normal and behavior is normal. Thought content normal.  Nursing note and vitals reviewed.   BP 140/90   Pulse 87   Temp 98.7 F (37.1 C) (Oral)   Resp 16   Ht 5' 6.5" (1.689 m)   Wt 233 lb (105.7 kg)   SpO2 99%   BMI 37.04 kg/m   Assessment and Plan: 1. Annual physical exam Normal exam except for weight Continue to work on healthy diet and exercise Glucose elevated on random lab - will need A1C  2. Encounter for screening mammogram for breast cancer Pt will schedule with [facility] after records from [facility] received - MM DIGITAL SCREENING BILATERAL; Future  3. Essential hypertension controlled  4. Mixed hyperlipidemia Pt defers labs today - will do in several months  5. SVT (supraventricular tachycardia) (HCC) Controlled with cardiazem daily plus PRN cardiazem  6. Moderate persistent reactive airway disease without complication Improved on Serevent and Flovent  7. Need for hepatitis C screening test Would like to do at next visit  8. Papanicolaou smear for cervical cancer  screening Obtained today - Pap IG (Image Guided)  9. Benign essential tremor stable  10. Arm pain, right Continue gabapentin - gabapentin (NEURONTIN) 300 MG capsule; Take 1 capsule (300 mg total) by mouth 3 (three) times daily. Three capsules daily at bedtime.  Dispense: 270 capsule; Refill: 3  11. Renal insufficiency Seen in January - will need recheck in several months   Meds ordered this encounter  Medications  . gabapentin (NEURONTIN) 300 MG capsule    Sig: Take 1 capsule (300 mg total) by mouth 3 (three) times daily. Three capsules daily at bedtime.    Dispense:  270 capsule    Refill:  3    Partially dictated using Editor, commissioning. Any errors are unintentional.  Halina Maidens, MD [facility]  11/08/2017

## 2017-11-12 LAB — PAP IG (IMAGE GUIDED): PAP Smear Comment: 0

## 2017-11-14 ENCOUNTER — Ambulatory Visit (INDEPENDENT_AMBULATORY_CARE_PROVIDER_SITE_OTHER): Payer: 59 | Admitting: Internal Medicine

## 2017-11-14 ENCOUNTER — Encounter: Payer: Self-pay | Admitting: Internal Medicine

## 2017-11-14 VITALS — BP 124/84 | HR 96 | Resp 16 | Ht 65.5 in | Wt 232.0 lb

## 2017-11-14 DIAGNOSIS — J4520 Mild intermittent asthma, uncomplicated: Secondary | ICD-10-CM | POA: Diagnosis not present

## 2017-11-14 DIAGNOSIS — J301 Allergic rhinitis due to pollen: Secondary | ICD-10-CM | POA: Diagnosis not present

## 2017-11-14 MED ORDER — CETIRIZINE HCL 10 MG PO TABS: 10.0000 mg | ORAL_TABLET | Freq: Every day | ORAL | 6 refills | Status: DC

## 2017-11-14 MED ORDER — OLODATEROL HCL 2.5 MCG/ACT IN AERS: 2.0000 | INHALATION_SPRAY | Freq: Every day | RESPIRATORY_TRACT | 3 refills | Status: DC

## 2017-11-14 NOTE — Patient Instructions (Addendum)
Continue Inhalers as prescribed  Restart Zyrtec  RESTART ANTIREFLUX MEDS-recommend Zantac

## 2017-11-14 NOTE — Progress Notes (Signed)
Name: Audrey Martinez MRN: [id number] DOB: [birth year]     CONSULTATION DATE:11/14/2017  REFERRING MD :  Dr Rockey Situ  CHIEF COMPLAINT:SOB  STUDIES:  NO CXR at this time   HISTORY OF PRESENT ILLNESS:  72 year old white female seen today for assessment for her underlying asthma On  Flovent and Serevent and doing really well  +cough for last several weeks Mucus production Feels lethargic at times No fevers, no chills NO resp distress Would like to stop powdered Serevent Has taken Zyrtec in the past  Has uncontrolled GERD is allergic to omeproazole     PREVIOUS ASTHMA HISTORY chronic cough daily for the past several years Aggravating factors -Cold air and moisture,Strong perfumes, smoke   chronic nasal congestion she has tried Zyrtec and Claritin and did not work she has also tried Designer, industrial/product and did not work  Patient was started on [employer] and Serevent and this has helped her symptoms in the past but some reason she was switched over to Advair and the Advair has not helped her at all  Patient does not take any aspirin due to the fact that she states that she has an aspirin sensitivity that triggers her asthma  At this time there is no signs of infection at this time No signs of acute asthma exacerbation at this time She has no signs of acute heart failure at this time  REVIEW OF SYSTEMS:   Constitutional: Negative for fever, chills, weight loss, malaise/fatigue and diaphoresis.  HENT: Negative for hearing loss, ear pain, nosebleeds, congestion, sore throat, neck pain, tinnitus and ear discharge.   Eyes: Negative for blurred vision, double vision, photophobia, pain, discharge and redness.  Respiratory: +cough, hemoptysis, sputum production, shortness of breath, wheezing and stridor.   Cardiovascular: Negative for chest pain, palpitations, orthopnea, claudication, leg swelling and PND.  +GERD ALL OTHER ROS ARE NEGATIVE   BP 124/84 (BP Location: Left Arm, Cuff Size:  Large)   Pulse 96   Resp 16   Ht 5' 5.5" (1.664 m)   Wt 232 lb (105.2 kg)   SpO2 93%   BMI 38.02 kg/m    Physical Examination:   GENERAL:NAD, no fevers, chills, no weakness no fatigue HEAD: Normocephalic, atraumatic.  EYES: Pupils equal, round, reactive to light. Extraocular muscles intact. No scleral icterus.  MOUTH: Moist mucosal membrane.   EAR, NOSE, THROAT: Clear without exudates. No external lesions.  NECK: Supple. No thyromegaly. No nodules. No JVD.  PULMONARY:CTA B/L no wheezes, no crackles, no rhonchi +mucus production CARDIOVASCULAR: S1 and S2. Regular rate and rhythm. No murmurs, rubs, or gallops. No edema.  SKIN: No ulceration, lesions, rashes, or cyanosis. Skin warm and dry. Turgor intact.  PSYCHIATRIC: Mood, affect within normal limits. The patient is awake, alert and oriented x 3. Insight, judgment intact.       ASSESSMENT / PLAN: 72 year old pleasant white female with a history of SVT seen today for follow up of  her reactive airways disease most likely related to mild intermittent asthma with allergic rhinitis and uncontrolled GERD  1.ASTHMA Mild intermittent and controlled at this time At this time I would recommend continuing Flovent and Serevent however patient wants to try new inhaler Striverdi and will assess her resp status Continue albuterol as needed  2.allerghic rhinitis Recommend Restarting Zyrtec   3.GERD Recommend starting Zantac for GERD   4.Obesity -recommend significant weight loss -recommend changing diet  5.Deconditioned state -Recommend increased daily activity and exercise   I have educated the patient about rinsing her  mouth after each use I have also educated the patient to avoid triggers Np prednisone at this time No ABX at this time  Patient satisfied with Plan of action and management. All questions answered Follow up in 6 months  [name] Patricia Pesa, M.D.  [facility]  Medical Director  [place] Director [facility] Cardio-Pulmonary Department

## 2017-11-27 ENCOUNTER — Inpatient Hospital Stay
Admission: RE | Admit: 2017-11-27 | Discharge: 2017-11-27 | Disposition: A | Discharge status: Home / Self Care | Payer: Self-pay | Source: Ambulatory Visit | Attending: *Deleted | Admitting: *Deleted

## 2017-11-27 ENCOUNTER — Other Ambulatory Visit: Payer: Self-pay | Admitting: *Deleted

## 2017-11-27 DIAGNOSIS — Z9289 Personal history of other medical treatment: Secondary | ICD-10-CM

## 2017-12-04 DIAGNOSIS — R202 Paresthesia of skin: Secondary | ICD-10-CM | POA: Diagnosis not present

## 2017-12-04 DIAGNOSIS — R200 Anesthesia of skin: Secondary | ICD-10-CM | POA: Diagnosis not present

## 2017-12-04 DIAGNOSIS — M79605 Pain in left leg: Secondary | ICD-10-CM | POA: Diagnosis not present

## 2017-12-13 ENCOUNTER — Ambulatory Visit: Payer: 59 | Admitting: Internal Medicine

## 2017-12-14 ENCOUNTER — Ambulatory Visit
Admission: RE | Admit: 2017-12-14 | Discharge: 2017-12-14 | Disposition: A | Discharge status: Home / Self Care | Payer: Medicare Other | Source: Ambulatory Visit | Attending: Internal Medicine | Admitting: Internal Medicine

## 2017-12-14 DIAGNOSIS — Z1231 Encounter for screening mammogram for malignant neoplasm of breast: Secondary | ICD-10-CM | POA: Insufficient documentation

## 2017-12-28 ENCOUNTER — Other Ambulatory Visit: Payer: Self-pay | Admitting: *Deleted

## 2017-12-28 ENCOUNTER — Inpatient Hospital Stay
Admission: RE | Admit: 2017-12-28 | Discharge: 2017-12-28 | Disposition: A | Discharge status: Home / Self Care | Payer: Self-pay | Source: Ambulatory Visit | Attending: *Deleted | Admitting: *Deleted

## 2017-12-28 DIAGNOSIS — Z9289 Personal history of other medical treatment: Secondary | ICD-10-CM

## 2018-01-09 DIAGNOSIS — L578 Other skin changes due to chronic exposure to nonionizing radiation: Secondary | ICD-10-CM | POA: Diagnosis not present

## 2018-01-09 DIAGNOSIS — L570 Actinic keratosis: Secondary | ICD-10-CM | POA: Diagnosis not present

## 2018-01-09 DIAGNOSIS — T07XXXA Unspecified multiple injuries, initial encounter: Secondary | ICD-10-CM | POA: Diagnosis not present

## 2018-01-09 DIAGNOSIS — D1800 Hemangioma unspecified site: Secondary | ICD-10-CM | POA: Diagnosis not present

## 2018-01-09 DIAGNOSIS — L219 Seborrheic dermatitis, unspecified: Secondary | ICD-10-CM | POA: Diagnosis not present

## 2018-01-09 DIAGNOSIS — Z1283 Encounter for screening for malignant neoplasm of skin: Secondary | ICD-10-CM | POA: Diagnosis not present

## 2018-01-09 DIAGNOSIS — L812 Freckles: Secondary | ICD-10-CM | POA: Diagnosis not present

## 2018-01-09 DIAGNOSIS — Z85828 Personal history of other malignant neoplasm of skin: Secondary | ICD-10-CM | POA: Diagnosis not present

## 2018-01-09 DIAGNOSIS — L739 Follicular disorder, unspecified: Secondary | ICD-10-CM | POA: Diagnosis not present

## 2018-01-09 DIAGNOSIS — D692 Other nonthrombocytopenic purpura: Secondary | ICD-10-CM | POA: Diagnosis not present

## 2018-01-16 ENCOUNTER — Encounter: Payer: Self-pay | Admitting: Internal Medicine

## 2018-01-16 ENCOUNTER — Ambulatory Visit (INDEPENDENT_AMBULATORY_CARE_PROVIDER_SITE_OTHER): Payer: Medicare Other | Admitting: Internal Medicine

## 2018-01-16 VITALS — BP 128/64 | HR 97 | Ht 65.0 in | Wt 231.0 lb

## 2018-01-16 DIAGNOSIS — E782 Mixed hyperlipidemia: Secondary | ICD-10-CM

## 2018-01-16 DIAGNOSIS — J4540 Moderate persistent asthma, uncomplicated: Secondary | ICD-10-CM

## 2018-01-16 DIAGNOSIS — M5136 Other intervertebral disc degeneration, lumbar region: Secondary | ICD-10-CM

## 2018-01-16 DIAGNOSIS — Z1159 Encounter for screening for other viral diseases: Secondary | ICD-10-CM

## 2018-01-16 DIAGNOSIS — R739 Hyperglycemia, unspecified: Secondary | ICD-10-CM | POA: Diagnosis not present

## 2018-01-16 DIAGNOSIS — I10 Essential (primary) hypertension: Secondary | ICD-10-CM | POA: Diagnosis not present

## 2018-01-16 NOTE — Progress Notes (Signed)
Date:  01/16/2018   Name:  Audrey Martinez   DOB:  [birth year]   MRN:  [id number]   Chief Complaint: Hypertension and Hyperlipidemia Hypertension  This is a chronic problem. The problem is controlled. Pertinent negatives include no chest pain, headaches, palpitations or shortness of breath. Past treatments include calcium channel blockers. The current treatment provides significant improvement.  Hyperlipidemia  This is a chronic problem. Pertinent negatives include no chest pain or shortness of breath. She is currently on no antihyperlipidemic treatment (intolerant to multiple statins).  Lumbar DDD - requests replacement handicapped parking permit. Hyperglycemia - has had several elevated random glucoses but no history of pre-diabetes. RAD - followed by Pulmonary, now on Flovent and Olodaterol, recently add claritin.  Feels that she is breathing a bit more easily.   Review of Systems  Constitutional: Negative for diaphoresis and fever.  HENT: Negative for hearing loss and trouble swallowing.   Eyes: Negative for visual disturbance.  Respiratory: Negative for cough, chest tightness, shortness of breath and wheezing.   Cardiovascular: Negative for chest pain, palpitations and leg swelling.  Gastrointestinal: Negative for abdominal pain.  Musculoskeletal: Positive for arthralgias and back pain. Negative for joint swelling.  Neurological: Negative for dizziness, light-headedness and headaches.  Psychiatric/Behavioral: Negative for dysphoric mood and sleep disturbance.    Patient Active Problem List   Diagnosis Date Noted  . Arm pain, right 11/08/2017  . Renal insufficiency 11/08/2017  . Tendinitis of right knee 07/03/2017  . Lumbar radiculopathy 05/18/2017  . Status post hemilaminotomy 05/18/2017  . Lumbar degenerative disc disease 05/18/2017  . Spinal stenosis of lumbar region without neurogenic claudication 05/18/2017  . Atrial tachycardia ([place]) 04/14/2017  . SVT  (supraventricular tachycardia) ([place]) 04/14/2017  . Essential hypertension 04/14/2017  . Reactive airway disease 04/14/2017  . Osteoporosis 03/21/2017  . Benign essential tremor 03/21/2017  . Obesity 09/14/2011  . Hyperlipidemia 05/12/2003  . Personal history of transient ischemic attack (TIA), and cerebral infarction without residual deficits 04/22/2003    Allergies  Allergen Reactions  . Aspirin Anaphylaxis  . Ciprofloxacin Rash  . Ciprofloxacin-Dexamethasone Anaphylaxis  . Crestor [Rosuvastatin] Other (See Comments)    Caused problems with memory.  . Morphine And Related Nausea Only  . Amoxicillin Hives  . Doxycycline Hyclate Hives  . Hydrochlorothiazide Other (See Comments)    Raises her blood pressure.   Marland Kitchen Keflex [Cephalexin] Hives  . Lovastatin Other (See Comments)    "Unable to function"  . Simvastatin Other (See Comments)    "Unable to function"  . Erythromycin Hives  . Ibuprofen Other (See Comments)    Sharp stomach pains  . Neomycin-Bacitracin Zn-Polymyx Hives and Rash  . Omeprazole Palpitations    Tachycardia and same sensation as her SVTs per patient  . Other Hives and Rash  . Tape Hives and Rash    Past Surgical History:  Procedure Laterality Date  . BACK SURGERY  2017   L2-4 laminectomy and foraminal stenosis  . BREAST CYST ASPIRATION Left 1977  . CARDIAC CATHETERIZATION  2009   [facility] in [place]  . CARDIAC ELECTROPHYSIOLOGY STUDY AND ABLATION  2009  . CATARACT EXTRACTION, BILATERAL  2010  . derrick procedure  1977-78   bilateral   . ESOPHAGOGASTRODUODENOSCOPY  2013   gastritis; done for complaint of dysphagia  . lymph node removal     neck  . MOHS SURGERY  07/2016   BCCA nose  . REPLACEMENT TOTAL KNEE  2013   RT  . TOTAL  SHOULDER REPLACEMENT  2012   RT     Social History   Tobacco Use  . Smoking status: Never Smoker  . Smokeless tobacco: Never Used  Substance Use Topics  . Alcohol use: No  . Drug use: No      Medication list has been reviewed and updated.  Current Meds  Medication Sig  . baclofen (LIORESAL) 10 MG tablet TAKE 1 TABLET BY MOUTH THREE TIMES A DAY (Patient taking differently: 2 (two) times daily. )  . calcium-vitamin D (OSCAL WITH D) 500-200 MG-UNIT tablet Take 1 tablet by mouth.  . cholecalciferol (VITAMIN D) 400 units TABS tablet Take 400 Units by mouth.  . Cranberry 1000 MG CAPS Take by mouth.  . diltiazem (CARDIZEM CD) 180 MG 24 hr capsule Take 1 capsule (180 mg total) daily by mouth.  . diltiazem (CARDIZEM) 60 MG tablet Take 60 mg by mouth 4 (four) times daily.  . fluticasone (FLOVENT HFA) 110 MCG/ACT inhaler Inhale 2 puffs into the lungs 2 (two) times daily.  Marland Kitchen gabapentin (NEURONTIN) 300 MG capsule Take 1 capsule (300 mg total) by mouth 3 (three) times daily. Three capsules daily at bedtime.  . Ginger 500 MG CAPS Take by mouth.  . Misc Natural Products (TART CHERRY ADVANCED PO) Take by mouth.  . Multiple Vitamins-Minerals (MULTIVITAMIN WITH MINERALS) tablet Take 1 tablet by mouth daily.  . NON FORMULARY Protleotic Enzymes  . Olodaterol HCl (STRIVERDI RESPIMAT) 2.5 MCG/ACT AERS Inhale 2 puffs into the lungs daily.  . Probiotic Product (PROBIOTIC DAILY PO) Take by mouth.  . Turmeric 500 MG CAPS Take by mouth.  . vitamin E 100 UNIT capsule Take by mouth daily.    PHQ 2/9 Scores 11/08/2017 07/04/2017 05/18/2017 05/03/2017  PHQ - 2 Score 0 0 0 0    Physical Exam  Constitutional: She is oriented to person, place, and time. She appears well-developed. No distress.  HENT:  Head: Normocephalic and atraumatic.  Neck: Normal range of motion. Neck supple.  Cardiovascular: Normal rate, regular rhythm and normal heart sounds.  Pulmonary/Chest: Effort normal and breath sounds normal. No respiratory distress.  Musculoskeletal: She exhibits no edema or tenderness.  Neurological: She is alert and oriented to person, place, and time.  Skin: Skin is warm and dry. No rash noted.   Psychiatric: She has a normal mood and affect. Her behavior is normal. Thought content normal.  Nursing note and vitals reviewed.   BP 128/64   Pulse 97   Ht 5\' 5"  (1.651 m)   Wt 231 lb (104.8 kg)   SpO2 95%   BMI 38.44 kg/m   Assessment and Plan: 1. Essential hypertension controlled - Comprehensive metabolic panel - TSH  2. Mixed hyperlipidemia Check labs, consider zetia or welchol - Lipid panel  3. Need for hepatitis C screening test - Hepatitis C antibody  4. Hyperglycemia Check A1C to rule out DM - Hemoglobin A1c  5. Lumbar degenerative disc disease Handicapped parking application given  6. Moderate persistent reactive airway disease without complication Improved on current regimen  No orders of the defined types were placed in this encounter.   Partially dictated using Editor, commissioning. Any errors are unintentional.  Halina Maidens, MD [facility]  01/16/2018

## 2018-01-19 ENCOUNTER — Telehealth: Payer: Self-pay | Admitting: Internal Medicine

## 2018-01-19 NOTE — Telephone Encounter (Signed)
Pt states Dr. Mortimer Fries advised her to have a "gastroscope". States she woke up this morning with some blood in her mouth. States she has been hoarse, states this has been going on for over a year. Please call to discuss.

## 2018-01-22 NOTE — Telephone Encounter (Signed)
appt scheduled for re-eval 01/23/18 @ 3:45

## 2018-01-22 NOTE — Telephone Encounter (Signed)
Needs to be evaluated in office

## 2018-01-23 ENCOUNTER — Ambulatory Visit (INDEPENDENT_AMBULATORY_CARE_PROVIDER_SITE_OTHER): Payer: Medicare Other | Admitting: Internal Medicine

## 2018-01-23 ENCOUNTER — Encounter: Payer: Self-pay | Admitting: Internal Medicine

## 2018-01-23 ENCOUNTER — Telehealth: Payer: Self-pay

## 2018-01-23 VITALS — BP 148/106 | HR 92 | Ht 65.0 in | Wt 236.0 lb

## 2018-01-23 DIAGNOSIS — J449 Chronic obstructive pulmonary disease, unspecified: Secondary | ICD-10-CM

## 2018-01-23 DIAGNOSIS — K2960 Other gastritis without bleeding: Secondary | ICD-10-CM | POA: Diagnosis not present

## 2018-01-23 NOTE — Patient Instructions (Signed)
Needs referral to GI  Continue inhalers as prescribed

## 2018-01-23 NOTE — Telephone Encounter (Signed)
Called patient to remind her to have blood work done as soon as possible and she said she is still waiting on her medicare card to come in the mail before she can have blood work done.  Please Advise.

## 2018-01-23 NOTE — Telephone Encounter (Signed)
-----   Message from Glean Hess, MD sent at 01/22/2018  4:38 PM EDT ----- Please call patient and remind her to get her lab work done.

## 2018-01-23 NOTE — Progress Notes (Signed)
Name: Audrey Martinez MRN: [id number] DOB: 11-30-1945     CONSULTATION DATE:01/23/2018  REFERRING MD :  Dr Rockey Situ  CHIEF COMPLAINT:SOB, blood and mucus in back of throat  STUDIES:  NO CXR at this time  PREVIOUS ASTHMA HISTORY chronic cough daily for the past several years Aggravating factors -Cold air and moisture,Strong perfumes, smoke   chronic nasal congestion she has tried Zyrtec and Claritin and did not work she has also tried Designer, industrial/product and did not work  Patient was started on [employer] and Serevent and this has helped her symptoms in the past but some reason she was switched over to [place] has not helped her at all  Patient does not take any aspirin due to the fact that she states that she has an aspirin sensitivity that triggers her asthma  HISTORY OF PRESENT ILLNESS:  72 year old white female seen today for assessment for her underlying asthma On Striverdi and doing really well   +cough for last several weeks +blood tinged mucus She feels her acid reflux is out of control She needs GI docs right away +Mucus production   No fevers, no chills NO resp distress Now on claritan  Has uncontrolled GERD is allergic to omeproazole    At this time there is no signs of infection at this time No signs of acute asthma exacerbation at this time She has no signs of acute heart failure at this time  REVIEW OF SYSTEMS:   Constitutional: Negative for fever, chills, weight loss, malaise/fatigue and diaphoresis.  HENT: Negative for hearing loss, ear pain, nosebleeds, congestion, sore throat, neck pain, tinnitus and ear discharge.   Eyes: Negative for blurred vision, double vision, photophobia, pain, discharge and redness.  Respiratory: +cough, hemoptysis, +sputum production, +shortness of breath,- wheezing and stridor.   Cardiovascular: Negative for chest pain, palpitations, orthopnea, claudication, leg swelling and PND.  +GERD ALL OTHER ROS ARE  NEGATIVE   BP (!) 148/106 (BP Location: Left Arm, Cuff Size: Normal)   Pulse 92   Ht 5\' 5"  (1.651 m)   Wt 236 lb (107 kg)   SpO2 94%   BMI 39.27 kg/m    Physical Examination:   GENERAL:NAD, no fevers, chills, no weakness no fatigue HEAD: Normocephalic, atraumatic.  EYES: Pupils equal, round, reactive to light. Extraocular muscles intact. No scleral icterus.  MOUTH: Moist mucosal membrane.   EAR, NOSE, THROAT: Clear without exudates. No external lesions.  NECK: Supple. No thyromegaly. No nodules. No JVD.  PULMONARY:CTA B/L no wheezes, no crackles, no rhonchi +mucus production CARDIOVASCULAR: S1 and S2. Regular rate and rhythm. No murmurs, rubs, or gallops. No edema.  SKIN: No ulceration, lesions, rashes, or cyanosis. Skin warm and dry. Turgor intact.  PSYCHIATRIC: Mood, affect within normal limits. The patient is awake, alert and oriented x 3. Insight, judgment intact.    Active Ambulatory Problems    Diagnosis Date Noted  . Osteoporosis 03/21/2017  . Personal history of transient ischemic attack (TIA), and cerebral infarction without residual deficits 04/22/2003  . Hyperlipidemia 05/12/2003  . Obesity 09/14/2011  . Benign essential tremor 03/21/2017  . Atrial tachycardia ([place]) 04/14/2017  . SVT (supraventricular tachycardia) ([place]) 04/14/2017  . Essential hypertension 04/14/2017  . Reactive airway disease 04/14/2017  . Lumbar radiculopathy 05/18/2017  . Status post hemilaminotomy 05/18/2017  . Lumbar degenerative disc disease 05/18/2017  . Spinal stenosis of lumbar region without neurogenic claudication 05/18/2017  . Tendinitis of right knee 07/03/2017  . Arm pain, right 11/08/2017  . Renal  insufficiency 11/08/2017   Resolved Ambulatory Problems    Diagnosis Date Noted  . No Resolved Ambulatory Problems   Past Medical History:  Diagnosis Date  . Asthma   . Herniated disc, cervical   . Left wrist fracture   . Stroke [facility]) 1998      ASSESSMENT /  PLAN: 72 year old pleasant white female with a history of SVT seen today for follow up of  her reactive airways disease most likely related to mild intermittent asthma with allergic rhinitis and uncontrolled GERD and now with blood tinged mucus likely related to acid reflux as patient described as burning sensation  1.ASTHMA Mild intermittent and controlled at this time At this time I would recommend continuing Flovent and Serevent however patient wants to try new inhaler Striverdi-has helped alot Continue albuterol as needed  2.allerghic rhinitis Restarted claritian   3.GERD On PPI GI referral for uncontrolled reflux   4.Obesity -recommend significant weight loss -recommend changing diet  5.Deconditioned state -Recommend increased daily activity and exercise   I have educated the patient about rinsing her mouth after each use I have also educated the patient to avoid triggers Np prednisone at this time No ABX at this time Gi referral pending  Patient satisfied with Plan of action and management. All questions answered Follow up in 6 months  Arsal Tappan Patricia Pesa, M.D.  [facility]  Medical Director [place] Director [facility] Cardio-P[facility] Department

## 2018-01-30 ENCOUNTER — Other Ambulatory Visit: Payer: Self-pay | Admitting: Internal Medicine

## 2018-03-02 ENCOUNTER — Encounter

## 2018-03-02 ENCOUNTER — Other Ambulatory Visit: Payer: Self-pay

## 2018-03-02 ENCOUNTER — Ambulatory Visit (INDEPENDENT_AMBULATORY_CARE_PROVIDER_SITE_OTHER): Payer: Medicare Other | Admitting: Gastroenterology

## 2018-03-02 ENCOUNTER — Encounter: Payer: Self-pay | Admitting: Gastroenterology

## 2018-03-02 VITALS — BP 127/89 | HR 79 | Resp 17 | Ht 65.0 in | Wt 235.2 lb

## 2018-03-02 DIAGNOSIS — K219 Gastro-esophageal reflux disease without esophagitis: Secondary | ICD-10-CM

## 2018-03-02 DIAGNOSIS — R1319 Other dysphagia: Secondary | ICD-10-CM

## 2018-03-02 DIAGNOSIS — Z1211 Encounter for screening for malignant neoplasm of colon: Secondary | ICD-10-CM | POA: Diagnosis not present

## 2018-03-02 DIAGNOSIS — R1310 Dysphagia, unspecified: Secondary | ICD-10-CM

## 2018-03-02 NOTE — Progress Notes (Signed)
Cephas Darby, MD [address]  [place]  [place], [place] [address]  Main: [phone]  Fax: [phone]    Gastroenterology Consultation  Referring Provider:     Glean Hess, MD Primary Care Physician:  Glean Hess, MD Primary Gastroenterologist:  Dr. Cephas Darby Reason for Consultation: Dysphagia, mucus drainage        HPI:   Audrey Martinez is a 72 y.o. female referred by Dr. Army Melia, Jesse Sans, MD  for consultation & management of reflux.  Patient moved to [place] from [place] about an year ago. Since then, she reports exacerbation of asthma symptoms. She has been followed by Dr. Mortimer Fries for management of asthma.  Along with this, she has been experiencing burning sensation in her throat, throat discomfort as well as mucus drainage mixed with blood, sometimes difficulty swallowing.  She also has hoarseness of voice.  The symptoms are new for her.  She is taking omeprazole 20 mg, OTC once a day.   Patient has history of SVT, status post nodal ablation.  She is seeing Dr. Rockey Situ here  She has been receiving annual FIT testing in [place] when she was under [facility]  These have been negative to date. NSAIDs: None  Antiplts/Anticoagulants/Anti thrombotics: None  GI Procedures: EGD in 2013 10/13/2011 IMPRESSIONS: Normal esophagus. Four biopsies taken. Mild gastritis was found in the antrum and body of the stomach  (535.50). Two biopsies taken. Otherwise, the stomach appeared to be normal. Normal duodenum.  Past Medical History:  Diagnosis Date  . Asthma   . Herniated disc, cervical   . Left wrist fracture   . Stroke ([place]) 1998  . SVT (supraventricular tachycardia) ([facility])     Past Surgical History:  Procedure Laterality Date  . BACK SURGERY  2017   L2-4 laminectomy and foraminal stenosis  . BREAST CYST ASPIRATION Left 1977  . CARDIAC CATHETERIZATION  2009   [facility] in [place]  . CARDIAC  ELECTROPHYSIOLOGY STUDY AND ABLATION  2009  . CATARACT EXTRACTION, BILATERAL  2010  . derrick procedure  1977-78   bilateral   . ESOPHAGOGASTRODUODENOSCOPY  2013   gastritis; done for complaint of dysphagia  . lymph node removal     neck  . MOHS SURGERY  07/2016   BCCA nose  . REPLACEMENT TOTAL KNEE  2013   RT  . TOTAL SHOULDER REPLACEMENT  2012   RT     Current Outpatient Medications:  .  baclofen (LIORESAL) 10 MG tablet, TAKE 1 TABLET BY MOUTH THREE TIMES A DAY (Patient taking differently: 2 (two) times daily. ), Disp: 270 tablet, Rfl: 1 .  calcium-vitamin D (OSCAL WITH D) 500-200 MG-UNIT tablet, Take 1 tablet by mouth., Disp: , Rfl:  .  cholecalciferol (VITAMIN D) 400 units TABS tablet, Take 400 Units by mouth., Disp: , Rfl:  .  Cranberry 1000 MG CAPS, Take by mouth., Disp: , Rfl:  .  diltiazem (CARDIZEM CD) 180 MG 24 hr capsule, Take 1 capsule (180 mg total) daily by mouth., Disp: 90 capsule, Rfl: 4 .  diltiazem (CARDIZEM) 60 MG tablet, Take 60 mg by mouth 4 (four) times daily., Disp: , Rfl:  .  FLOVENT HFA 110 MCG/ACT inhaler, TAKE 2 PUFFS BY MOUTH TWICE A DAY, Disp: 36 Inhaler, Rfl: 1 .  gabapentin (NEURONTIN) 300 MG capsule, Take 1 capsule (300 mg total) by mouth 3 (three) times daily. Three capsules daily at bedtime., Disp: 270 capsule, Rfl: 3 .  Ginger  500 MG CAPS, Take by mouth., Disp: , Rfl:  .  Multiple Vitamins-Minerals (MULTIVITAMIN WITH MINERALS) tablet, Take 1 tablet by mouth daily., Disp: , Rfl:  .  STRIVERDI RESPIMAT 2.5 MCG/ACT AERS, INHALE 2 PUFFS INTO THE LUNGS DAILY., Disp: 12 g, Rfl: 2 .  Turmeric 500 MG CAPS, Take by mouth., Disp: , Rfl:  .  vitamin E 100 UNIT capsule, Take by mouth daily., Disp: , Rfl:  .  Misc Natural Products (TART CHERRY ADVANCED PO), Take by mouth., Disp: , Rfl:  .  NON FORMULARY, Protleotic Enzymes, Disp: , Rfl:  .  Probiotic Product (PROBIOTIC DAILY PO), Take by mouth., Disp: , Rfl:     Family History  Problem Relation Age of Onset    . Diabetes Mother   . Stroke Mother   . Valvular heart disease Father   . Breast cancer Neg Hx      Social History   Tobacco Use  . Smoking status: Never Smoker  . Smokeless tobacco: Never Used  Substance Use Topics  . Alcohol use: No  . Drug use: No    Allergies as of 03/02/2018 - Review Complete 03/02/2018  Allergen Reaction Noted  . Aspirin Anaphylaxis 03/21/2017  . Ciprofloxacin Rash 08/28/2017  . Ciprofloxacin-dexamethasone Anaphylaxis 03/21/2017  . Crestor [rosuvastatin] Other (See Comments) 03/21/2017  . Morphine and related Nausea Only 07/04/2017  . Amoxicillin Hives 03/21/2017  . Doxycycline hyclate Hives 03/21/2017  . Hydrochlorothiazide Other (See Comments) 03/21/2017  . Keflex [cephalexin] Hives 03/21/2017  . Lovastatin Other (See Comments) 03/21/2017  . Simvastatin Other (See Comments) 03/21/2017  . Erythromycin Hives 03/21/2017  . Ibuprofen Other (See Comments) 03/21/2017  . Neomycin-bacitracin zn-polymyx Hives and Rash 03/21/2017  . Omeprazole Palpitations 03/21/2017  . Tape Hives and Rash 03/21/2017    Review of Systems:    All systems reviewed and negative except where noted in HPI.   Physical Exam:  BP 127/89 (BP Location: Left Arm, Patient Position: Sitting, Cuff Size: Large)   Pulse 79   Resp 17   Ht 5\' 5"  (1.651 m)   Wt 235 lb 3.2 oz (106.7 kg)   BMI 39.14 kg/m  No LMP recorded. Patient is postmenopausal.  General:   Alert,  Well-developed, well-nourished, pleasant and cooperative in NAD Head:  Normocephalic and atraumatic. Eyes:  Sclera clear, no icterus.   Conjunctiva pink. Ears:  Normal auditory acuity. Nose:  No deformity, discharge, or lesions. Mouth:  No deformity or lesions,oropharynx pink & moist. Neck:  Supple; no masses or thyromegaly. Lungs:  Respirations even and unlabored.  Clear throughout to auscultation.   No wheezes, crackles, or rhonchi. No acute distress. Heart:  Regular rate and rhythm; no murmurs, clicks, rubs, or  gallops. Abdomen:  Normal bowel sounds. Soft, non-tender and non-distended without masses, hepatosplenomegaly or hernias noted.  No guarding or rebound tenderness.   Rectal: Not performed Msk:  Symmetrical without gross deformities. Good, equal movement & strength bilaterally. Pulses:  Normal pulses noted. Extremities:  No clubbing or edema.  No cyanosis. Neurologic:  Alert and oriented x3;  grossly normal neurologically. Skin:  Intact without significant lesions or rashes. No jaundice. Lymph Nodes:  No significant cervical adenopathy. Psych:  Alert and cooperative. Normal mood and affect.  Imaging Studies: Reviewed  Assessment and Plan:   Oneal Biglow is a 72 y.o. Caucasian female with obesity, seen in consultation for 1 year history of soreness in the throat, hoarseness of voice, dysphagia, bloody mucus discharge  -Continue omeprazole 20 mg daily  for now -Recommend EGD for further evaluation, with esophageal biopsies  Colon cancer screening: Discussed with her about colonoscopy over FIT testing and patient is willing to undergo along with EGD  I have discussed alternative options, risks & benefits,  which include, but are not limited to, bleeding, infection, perforation,respiratory complication & drug reaction.  The patient agrees with this plan & written consent will be obtained.     Follow up in 1 month   Cephas Darby, MD

## 2018-03-14 ENCOUNTER — Encounter: Payer: Self-pay | Admitting: *Deleted

## 2018-03-14 ENCOUNTER — Other Ambulatory Visit: Payer: Self-pay

## 2018-03-15 NOTE — Anesthesia Preprocedure Evaluation (Addendum)
Anesthesia Evaluation  Patient identified by MRN, date of birth, ID band Patient awake    Reviewed: Allergy & Precautions, NPO status , Patient's Chart, lab work & pertinent test results, reviewed documented beta blocker date and time   Airway Mallampati: II  TM Distance: >3 FB Neck ROM: Full    Dental no notable dental hx.    Pulmonary asthma ,    Pulmonary exam normal breath sounds clear to auscultation       Cardiovascular hypertension, Normal cardiovascular exam Rhythm:Regular Rate:Normal  H/o SVT controlled with cardizem.  No issues per last PCP note.   Neuro/Psych  Neuromuscular disease CVA (1998, symptoms resolved)    GI/Hepatic GERD  ,  Endo/Other  negative endocrine ROS  Renal/GU Renal disease  negative genitourinary   Musculoskeletal  (+) Arthritis ,   Abdominal Normal abdominal exam  (+)   Peds  Hematology negative hematology ROS (+)   Anesthesia Other Findings   Reproductive/Obstetrics                           Anesthesia Physical Anesthesia Plan  ASA: III  Anesthesia Plan: General   Post-op Pain Management:    Induction: Intravenous  PONV Risk Score and Plan:   Airway Management Planned: Natural Airway  Additional Equipment: None  Intra-op Plan:   Post-operative Plan:   Informed Consent: I have reviewed the patients History and Physical, chart, labs and discussed the procedure including the risks, benefits and alternatives for the proposed anesthesia with the patient or authorized representative who has indicated his/her understanding and acceptance.     Plan Discussed with: CRNA, Anesthesiologist and Surgeon  Anesthesia Plan Comments:         Anesthesia Quick Evaluation

## 2018-03-16 DIAGNOSIS — L570 Actinic keratosis: Secondary | ICD-10-CM | POA: Diagnosis not present

## 2018-03-16 DIAGNOSIS — L400 Psoriasis vulgaris: Secondary | ICD-10-CM | POA: Diagnosis not present

## 2018-03-20 ENCOUNTER — Telehealth: Payer: Self-pay | Admitting: Gastroenterology

## 2018-03-20 NOTE — Telephone Encounter (Signed)
PT left vm she is scheduled for procedure tomorrow with Dr. Marius Ditch and her rx Suprep states if  Any heart issues to inform Provider please call pt

## 2018-03-20 NOTE — Discharge Instructions (Signed)
General Anesthesia, Adult, Care After °These instructions provide you with information about caring for yourself after your procedure. Your health care provider may also give you more specific instructions. Your treatment has been planned according to current medical practices, but problems sometimes occur. Call your health care provider if you have any problems or questions after your procedure. °What can I expect after the procedure? °After the procedure, it is common to have: °· Vomiting. °· A sore throat. °· Mental slowness. ° °It is common to feel: °· Nauseous. °· Cold or shivery. °· Sleepy. °· Tired. °· Sore or achy, even in parts of your body where you did not have surgery. ° °Follow these instructions at home: °For at least 24 hours after the procedure: °· Do not: °? Participate in activities where you could fall or become injured. °? Drive. °? Use heavy machinery. °? Drink alcohol. °? Take sleeping pills or medicines that cause drowsiness. °? Make important decisions or sign legal documents. °? Take care of children on your own. °· Rest. °Eating and drinking °· If you vomit, drink water, juice, or soup when you can drink without vomiting. °· Drink enough fluid to keep your urine clear or pale yellow. °· Make sure you have little or no nausea before eating solid foods. °· Follow the diet recommended by your health care provider. °General instructions °· Have a responsible adult stay with you until you are awake and alert. °· Return to your normal activities as told by your health care provider. Ask your health care provider what activities are safe for you. °· Take over-the-counter and prescription medicines only as told by your health care provider. °· If you smoke, do not smoke without supervision. °· Keep all follow-up visits as told by your health care provider. This is important. °Contact a health care provider if: °· You continue to have nausea or vomiting at home, and medicines are not helpful. °· You  cannot drink fluids or start eating again. °· You cannot urinate after 8-12 hours. °· You develop a skin rash. °· You have fever. °· You have increasing redness at the site of your procedure. °Get help right away if: °· You have difficulty breathing. °· You have chest pain. °· You have unexpected bleeding. °· You feel that you are having a life-threatening or urgent problem. °This information is not intended to replace advice given to you by your health care provider. Make sure you discuss any questions you have with your health care provider. °Document Released: 08/22/2000 Document Revised: 10/19/2015 Document Reviewed: 04/30/2015 °Elsevier Interactive Patient Education © 2018 [employer] ° °

## 2018-03-20 NOTE — Telephone Encounter (Signed)
Spoke with pt and she has been notified to please follow instructions provided by our office and not the box and also it is ok to take the bowel prep, pt verbalized understanding

## 2018-03-21 ENCOUNTER — Ambulatory Visit: Payer: Medicare Other | Admitting: Anesthesiology

## 2018-03-21 ENCOUNTER — Encounter
Admission: RE | Disposition: A | Discharge status: Home / Self Care | Payer: Self-pay | Source: Ambulatory Visit | Attending: Gastroenterology

## 2018-03-21 ENCOUNTER — Ambulatory Visit
Admission: RE | Admit: 2018-03-21 | Discharge: 2018-03-21 | Disposition: A | Discharge status: Home / Self Care | Payer: Medicare Other | Source: Ambulatory Visit | Attending: Gastroenterology | Admitting: Gastroenterology

## 2018-03-21 DIAGNOSIS — Z7951 Long term (current) use of inhaled steroids: Secondary | ICD-10-CM | POA: Insufficient documentation

## 2018-03-21 DIAGNOSIS — K219 Gastro-esophageal reflux disease without esophagitis: Secondary | ICD-10-CM | POA: Diagnosis not present

## 2018-03-21 DIAGNOSIS — Z8249 Family history of ischemic heart disease and other diseases of the circulatory system: Secondary | ICD-10-CM | POA: Insufficient documentation

## 2018-03-21 DIAGNOSIS — D120 Benign neoplasm of cecum: Secondary | ICD-10-CM | POA: Diagnosis not present

## 2018-03-21 DIAGNOSIS — Z1211 Encounter for screening for malignant neoplasm of colon: Secondary | ICD-10-CM | POA: Insufficient documentation

## 2018-03-21 DIAGNOSIS — Z96651 Presence of right artificial knee joint: Secondary | ICD-10-CM | POA: Insufficient documentation

## 2018-03-21 DIAGNOSIS — I471 Supraventricular tachycardia: Secondary | ICD-10-CM | POA: Diagnosis not present

## 2018-03-21 DIAGNOSIS — Z96611 Presence of right artificial shoulder joint: Secondary | ICD-10-CM | POA: Insufficient documentation

## 2018-03-21 DIAGNOSIS — Z91048 Other nonmedicinal substance allergy status: Secondary | ICD-10-CM | POA: Diagnosis not present

## 2018-03-21 DIAGNOSIS — K644 Residual hemorrhoidal skin tags: Secondary | ICD-10-CM | POA: Diagnosis not present

## 2018-03-21 DIAGNOSIS — J45909 Unspecified asthma, uncomplicated: Secondary | ICD-10-CM | POA: Insufficient documentation

## 2018-03-21 DIAGNOSIS — K228 Other specified diseases of esophagus: Secondary | ICD-10-CM | POA: Insufficient documentation

## 2018-03-21 DIAGNOSIS — I10 Essential (primary) hypertension: Secondary | ICD-10-CM | POA: Insufficient documentation

## 2018-03-21 DIAGNOSIS — Z8673 Personal history of transient ischemic attack (TIA), and cerebral infarction without residual deficits: Secondary | ICD-10-CM | POA: Insufficient documentation

## 2018-03-21 DIAGNOSIS — Z79899 Other long term (current) drug therapy: Secondary | ICD-10-CM | POA: Diagnosis not present

## 2018-03-21 DIAGNOSIS — M1990 Unspecified osteoarthritis, unspecified site: Secondary | ICD-10-CM | POA: Diagnosis not present

## 2018-03-21 DIAGNOSIS — Z823 Family history of stroke: Secondary | ICD-10-CM | POA: Insufficient documentation

## 2018-03-21 DIAGNOSIS — D124 Benign neoplasm of descending colon: Secondary | ICD-10-CM | POA: Insufficient documentation

## 2018-03-21 DIAGNOSIS — N289 Disorder of kidney and ureter, unspecified: Secondary | ICD-10-CM | POA: Insufficient documentation

## 2018-03-21 DIAGNOSIS — Z9842 Cataract extraction status, left eye: Secondary | ICD-10-CM | POA: Diagnosis not present

## 2018-03-21 DIAGNOSIS — Z9841 Cataract extraction status, right eye: Secondary | ICD-10-CM | POA: Diagnosis not present

## 2018-03-21 DIAGNOSIS — Z881 Allergy status to other antibiotic agents status: Secondary | ICD-10-CM | POA: Insufficient documentation

## 2018-03-21 DIAGNOSIS — Z886 Allergy status to analgesic agent status: Secondary | ICD-10-CM | POA: Insufficient documentation

## 2018-03-21 DIAGNOSIS — Z885 Allergy status to narcotic agent status: Secondary | ICD-10-CM | POA: Diagnosis not present

## 2018-03-21 DIAGNOSIS — Z888 Allergy status to other drugs, medicaments and biological substances status: Secondary | ICD-10-CM | POA: Diagnosis not present

## 2018-03-21 DIAGNOSIS — Z833 Family history of diabetes mellitus: Secondary | ICD-10-CM | POA: Insufficient documentation

## 2018-03-21 DIAGNOSIS — R1314 Dysphagia, pharyngoesophageal phase: Secondary | ICD-10-CM | POA: Diagnosis not present

## 2018-03-21 DIAGNOSIS — Z7982 Long term (current) use of aspirin: Secondary | ICD-10-CM | POA: Insufficient documentation

## 2018-03-21 DIAGNOSIS — Z85828 Personal history of other malignant neoplasm of skin: Secondary | ICD-10-CM | POA: Insufficient documentation

## 2018-03-21 HISTORY — DX: Gastro-esophageal reflux disease without esophagitis: K21.9

## 2018-03-21 HISTORY — PX: ESOPHAGOGASTRODUODENOSCOPY (EGD) WITH PROPOFOL: SHX5813

## 2018-03-21 HISTORY — PX: COLONOSCOPY WITH PROPOFOL: SHX5780

## 2018-03-21 HISTORY — DX: Unspecified osteoarthritis, unspecified site: M19.90

## 2018-03-21 HISTORY — PX: POLYPECTOMY: SHX5525

## 2018-03-21 SURGERY — ESOPHAGOGASTRODUODENOSCOPY (EGD) WITH PROPOFOL
Anesthesia: General | Site: Throat

## 2018-03-21 MED ORDER — LIDOCAINE HCL (CARDIAC) PF 100 MG/5ML IV SOSY
PREFILLED_SYRINGE | INTRAVENOUS | Status: DC | PRN
  Administered 2018-03-21: 50 mg via INTRAVENOUS

## 2018-03-21 MED ORDER — STERILE WATER FOR IRRIGATION IR SOLN
Status: DC | PRN
  Administered 2018-03-21: 09:00:00

## 2018-03-21 MED ORDER — PROPOFOL 10 MG/ML IV BOLUS
INTRAVENOUS | Status: DC | PRN
  Administered 2018-03-21 (×2): 40 mg via INTRAVENOUS
  Administered 2018-03-21: 30 mg via INTRAVENOUS
  Administered 2018-03-21: 50 mg via INTRAVENOUS
  Administered 2018-03-21 (×2): 40 mg via INTRAVENOUS
  Administered 2018-03-21: 30 mg via INTRAVENOUS
  Administered 2018-03-21: 40 mg via INTRAVENOUS
  Administered 2018-03-21: 30 mg via INTRAVENOUS
  Administered 2018-03-21: 150 mg via INTRAVENOUS
  Administered 2018-03-21 (×2): 40 mg via INTRAVENOUS

## 2018-03-21 MED ORDER — GLYCOPYRROLATE 0.2 MG/ML IJ SOLN
INTRAMUSCULAR | Status: DC | PRN
  Administered 2018-03-21: 0.1 mg via INTRAVENOUS

## 2018-03-21 MED ORDER — SODIUM CHLORIDE 0.9 % IV SOLN: INTRAVENOUS | Status: DC

## 2018-03-21 MED ORDER — LACTATED RINGERS IV SOLN
INTRAVENOUS | Status: DC
  Administered 2018-03-21: 08:00:00 via INTRAVENOUS

## 2018-03-21 SURGICAL SUPPLY — 19 items
BLOCK BITE 60FR ADLT L/F GRN (MISCELLANEOUS) ×4 IMPLANT
CANISTER SUCT 1200ML W/VALVE (MISCELLANEOUS) ×4 IMPLANT
CLIP HMST 235XBRD CATH ROT (MISCELLANEOUS) ×6 IMPLANT
CLIP RESOLUTION 360 11X235 (MISCELLANEOUS) ×6
COLOWRAP XLRG (MISCELLANEOUS) ×4
COMPRESSION COLOWRAP XLRG (MISCELLANEOUS) ×2 IMPLANT
ELECT REM PT RETURN 9FT ADLT (ELECTROSURGICAL) ×4
ELECTRODE REM PT RTRN 9FT ADLT (ELECTROSURGICAL) ×2 IMPLANT
FORCEPS BIOP RAD 4 LRG CAP 4 (CUTTING FORCEPS) ×4 IMPLANT
GOWN CVR UNV OPN BCK APRN NK (MISCELLANEOUS) ×4 IMPLANT
GOWN ISOL THUMB LOOP REG UNIV (MISCELLANEOUS) ×4
INJECTOR VARIJECT VIN23 (MISCELLANEOUS) ×2 IMPLANT
KIT ENDO PROCEDURE OLY (KITS) ×4 IMPLANT
SNARE COLD EXACTO (MISCELLANEOUS) ×4 IMPLANT
SNARE SHORT THROW 13M SML OVAL (MISCELLANEOUS) ×4 IMPLANT
SNARE SPIRAL (MISCELLANEOUS) ×4 IMPLANT
TRAP ETRAP POLY (MISCELLANEOUS) ×4 IMPLANT
VARIJECT INJECTOR VIN23 (MISCELLANEOUS) ×4
WATER STERILE IRR 250ML POUR (IV SOLUTION) ×4 IMPLANT

## 2018-03-21 NOTE — Anesthesia Postprocedure Evaluation (Signed)
Anesthesia Post Note  Patient: Audrey Martinez  Procedure(s) Performed: ESOPHAGOGASTRODUODENOSCOPY (EGD) WITH Biopsies (N/A Throat) COLONOSCOPY WITH Biopsy (N/A Rectum) POLYPECTOMY (N/A Rectum)  Patient location during evaluation: PACU Anesthesia Type: General Level of consciousness: awake Pain management: pain level controlled Vital Signs Assessment: post-procedure vital signs reviewed and stable Respiratory status: spontaneous breathing Cardiovascular status: blood pressure returned to baseline Postop Assessment: no headache Anesthetic complications: no    Lavonna Monarch

## 2018-03-21 NOTE — Transfer of Care (Signed)
Immediate Anesthesia Transfer of Care Note  Patient: Audrey Martinez  Procedure(s) Performed: ESOPHAGOGASTRODUODENOSCOPY (EGD) WITH Biopsies (N/A Throat) COLONOSCOPY WITH Biopsy (N/A Rectum) POLYPECTOMY (N/A Rectum)  Patient Location: PACU  Anesthesia Type: General  Level of Consciousness: awake, alert  and patient cooperative  Airway and Oxygen Therapy: Patient Spontanous Breathing and Patient connected to supplemental oxygen  Post-op Assessment: Post-op Vital signs reviewed, Patient's Cardiovascular Status Stable, Respiratory Function Stable, Patent Airway and No signs of Nausea or vomiting  Post-op Vital Signs: Reviewed and stable  Complications: No apparent anesthesia complications

## 2018-03-21 NOTE — Anesthesia Procedure Notes (Signed)
Performed by: Brielle Moro, CRNA Pre-anesthesia Checklist: Patient identified, Emergency Drugs available, Suction available, Timeout performed and Patient being monitored Patient Re-evaluated:Patient Re-evaluated prior to induction Oxygen Delivery Method: Nasal cannula Placement Confirmation: positive ETCO2       

## 2018-03-21 NOTE — Op Note (Signed)
[facility] Patient Name: Audrey Martinez Procedure Date: 03/21/2018 7:35 AM MRN: [id number] Account #: [id number] Date of Birth: December 25, 1945 Admit Type: Outpatient Age: 72 Room: [facility] OR ROOM 01 Gender: Female Note Status: Finalized Procedure:            Colonoscopy Indications:          Screening for colorectal malignant neoplasm Providers:            Lin Landsman MD, MD Medicines:            Monitored Anesthesia Care Complications:        No immediate complications. Estimated blood loss: None. Procedure:            Pre-Anesthesia Assessment:                       - Prior to the procedure, a History and Physical was                        performed, and patient medications and allergies were                        reviewed. The patient is competent. The risks and                        benefits of the procedure and the sedation options and                        risks were discussed with the patient. All questions                        were answered and informed consent was obtained.                        Patient identification and proposed procedure were                        verified by the physician, the nurse, the                        anesthesiologist, the anesthetist and the technician in                        the pre-procedure area in the procedure room in the                        endoscopy suite. Mental Status Examination: alert and                        oriented. Airway Examination: normal oropharyngeal                        airway and neck mobility. Respiratory Examination:                        clear to auscultation. CV Examination: normal.                        Prophylactic Antibiotics: The patient does not require  prophylactic antibiotics. Prior Anticoagulants: The                        patient has taken no previous anticoagulant or                        antiplatelet agents. ASA Grade  Assessment: III - A                        patient with severe systemic disease. After reviewing                        the risks and benefits, the patient was deemed in                        satisfactory condition to undergo the procedure. The                        anesthesia plan was to use monitored anesthesia care                        (MAC). Immediately prior to administration of                        medications, the patient was re-assessed for adequacy                        to receive sedatives. The heart rate, respiratory rate,                        oxygen saturations, blood pressure, adequacy of                        pulmonary ventilation, and response to care were                        monitored throughout the procedure. The physical status                        of the patient was re-assessed after the procedure.                       After obtaining informed consent, the colonoscope was                        passed under direct vision. Throughout the procedure,                        the patient's blood pressure, pulse, and oxygen                        saturations were monitored continuously. The                        Colonoscope was introduced through the anus and                        advanced to the the terminal ileum, with identification  of the appendiceal orifice and IC valve. The                        colonoscopy was performed without difficulty. The                        patient tolerated the procedure well. The quality of                        the bowel preparation was evaluated using the BBPS                        [facility] Bowel Preparation Scale) with scores of: Right                        Colon = 3, Transverse Colon = 3 and Left Colon = 3                        (entire mucosa seen well with no residual staining,                        small fragments of stool or opaque liquid). The total                        BBPS score equals  9. Findings:      Skin tags were found on perianal exam.      The terminal ileum appeared normal.      Two sessile polyps were found in the cecum. The polyps were 5 to 8 mm in       size. These polyps were removed with a cold snare. Resection and       retrieval were complete. To prevent bleeding after the polypectomy, one       hemostatic clip was successfully placed (MR conditional). There was no       bleeding during, or at the end, of the procedure.      A 9 mm polyp was found in the descending colon. The polyp was flat.       Preparations were made for mucosal resection. Saline was injected to       raise the lesion. Snare mucosal resection was performed. Resection and       retrieval were complete. To prevent bleeding after mucosal resection,       one hemostatic clip was successfully placed (MR conditional). There was       no bleeding during, or at the end, of the procedure.      The retroflexed view of the distal rectum and anal verge was normal and       showed no anal or rectal abnormalities. Impression:           - Perianal skin tags found on perianal exam.                       - The examined portion of the ileum was normal.                       - Two 5 to 8 mm polyps in the cecum, removed with a  cold snare. Resected and retrieved. Clip (MR                        conditional) was placed.                       - One 9 mm polyp in the descending colon, removed with                        mucosal resection. Resected and retrieved. Clip (MR                        conditional) was placed.                       - The distal rectum and anal verge are normal on                        retroflexion view.                       - Mucosal resection was performed. Resection and                        retrieval were complete. Recommendation:       - Discharge patient to home (with escort).                       - Resume previous diet today.                       -  Continue present medications.                       - Await pathology results.                       - Repeat colonoscopy in 3 years for surveillance of                        multiple polyps.                       - Return to my office as previously scheduled. Procedure Code(s):    --- Professional ---                       [id number], 26, Colonoscopy, flexible; with endoscopic                        mucosal resection                       [phone], Colonoscopy, flexible; with removal of tumor(s),                        polyp(s), or other lesion(s) by snare technique Diagnosis Code(s):    --- Professional ---                       Z12.11, Encounter for screening for malignant neoplasm                        of colon  D12.0, Benign neoplasm of cecum                       D12.4, Benign neoplasm of descending colon                       K64.4, Residual hemorrhoidal skin tags CPT copyright 2018 American Medical Association. All rights reserved. The codes documented in this report are preliminary and upon coder review may  be revised to meet current compliance requirements. Dr. Ulyess Mort Lin Landsman MD, MD 03/21/2018 9:20:52 AM This report has been signed electronically. Number of Addenda: 0 Note Initiated On: 03/21/2018 7:35 AM Scope Withdrawal Time: 0 hours 28 minutes 15 seconds  Total Procedure Duration: 0 hours 31 minutes 15 seconds       [facility]

## 2018-03-21 NOTE — Op Note (Signed)
[facility] Patient Name: Audrey Martinez Procedure Date: 03/21/2018 7:35 AM MRN: [id number] Account #: [id number] Date of Birth: 1946-03-12 Admit Type: Outpatient Age: 72 Room: [facility] OR ROOM 01 Gender: Female Note Status: Finalized Procedure:            Upper GI endoscopy Indications:          Esophageal dysphagia, Suspected esophageal reflux Providers:            Lin Landsman MD, MD Referring MD:         Halina Maidens, MD (Referring MD) Medicines:            Monitored Anesthesia Care Complications:        No immediate complications. Estimated blood loss: None. Procedure:            Pre-Anesthesia Assessment:                       - Prior to the procedure, a History and Physical was                        performed, and patient medications and allergies were                        reviewed. The patient is competent. The risks and                        benefits of the procedure and the sedation options and                        risks were discussed with the patient. All questions                        were answered and informed consent was obtained.                        Patient identification and proposed procedure were                        verified by the physician, the nurse, the                        anesthesiologist, the anesthetist and the technician in                        the pre-procedure area in the procedure room in the                        endoscopy suite. Mental Status Examination: alert and                        oriented. Airway Examination: normal oropharyngeal                        airway and neck mobility. Respiratory Examination:                        clear to auscultation. CV Examination: normal.                        Prophylactic Antibiotics: The patient does not require  prophylactic antibiotics. Prior Anticoagulants: The                        patient has taken no previous  anticoagulant or                        antiplatelet agents. ASA Grade Assessment: III - A                        patient with severe systemic disease. After reviewing                        the risks and benefits, the patient was deemed in                        satisfactory condition to undergo the procedure. The                        anesthesia plan was to use monitored anesthesia care                        (MAC). Immediately prior to administration of                        medications, the patient was re-assessed for adequacy                        to receive sedatives. The heart rate, respiratory rate,                        oxygen saturations, blood pressure, adequacy of                        pulmonary ventilation, and response to care were                        monitored throughout the procedure. The physical status                        of the patient was re-assessed after the procedure.                       After obtaining informed consent, the endoscope was                        passed under direct vision. Throughout the procedure,                        the patient's blood pressure, pulse, and oxygen                        saturations were monitored continuously. The was                        introduced through the mouth, and advanced to the                        second part of duodenum. The upper GI endoscopy was  accomplished without difficulty. The patient tolerated                        the procedure well. Findings:      The duodenal bulb and second portion of the duodenum were normal.      The entire examined stomach was normal.      The cardia and gastric fundus were normal on retroflexion.      Mucosal changes including congestion (edema), crepe paper esophagus and       mucosal friability were found in the entire esophagus. Biopsies were       obtained from the proximal and distal esophagus with cold forceps for       histology of  suspected eosinophilic esophagitis. Estimated blood loss:       none.      Esophagogastric landmarks were identified: the gastroesophageal junction       was found at 39 cm from the incisors.      The gastroesophageal junction was normal. Impression:           - Normal duodenal bulb and second portion of the                        duodenum.                       - Normal stomach.                       - Esophageal mucosal changes suspicious for                        eosinophilic esophagitis. Biopsied.                       - Esophagogastric landmarks identified.                       - Normal gastroesophageal junction. Recommendation:       - Await pathology results.                       - Use Prilosec (omeprazole) 40 mg PO BID.                       - Proceed with colonoscopy as scheduled                       See colonoscopy report Procedure Code(s):    --- Professional ---                       [phone], Esophagogastroduodenoscopy, flexible, transoral;                        with biopsy, single or multiple Diagnosis Code(s):    --- Professional ---                       K22.8, Other specified diseases of esophagus                       R13.14, Dysphagia, pharyngoesophageal phase CPT copyright 2018 American Medical Association. All rights reserved. The codes documented in this report are preliminary and upon coder review may  be revised to meet current  compliance requirements. Dr. Ulyess Mort Lin Landsman MD, MD 03/21/2018 8:42:18 AM This report has been signed electronically. Number of Addenda: 0 Note Initiated On: 03/21/2018 7:35 AM      [facility]

## 2018-03-21 NOTE — H&P (Signed)
Cephas Darby, MD [address]  [place]  [place], [place] [address]  Main: [phone]  Fax: [phone] Pager: [phone]  Primary Care Physician:  Glean Hess, MD Primary Gastroenterologist:  Dr. Cephas Darby  Pre-Procedure History & Physical: HPI:  Audrey Martinez is a 72 y.o. female is here for an endoscopy and colonoscopy.   Past Medical History:  Diagnosis Date  . Arthritis   . Asthma   . GERD (gastroesophageal reflux disease)   . Herniated disc, cervical   . Left wrist fracture   . Stroke ([place]) 1998  . SVT (supraventricular tachycardia) ([facility])     Past Surgical History:  Procedure Laterality Date  . BACK SURGERY  2017   L2-4 laminectomy and foraminal stenosis  . BREAST CYST ASPIRATION Left 1977  . CARDIAC CATHETERIZATION  2009   [facility] in [place]  . CARDIAC ELECTROPHYSIOLOGY STUDY AND ABLATION  2009  . CATARACT EXTRACTION, BILATERAL  2010  . derrick procedure  1977-78   bilateral   . ESOPHAGOGASTRODUODENOSCOPY  2013   gastritis; done for complaint of dysphagia  . lymph node removal     neck  . MOHS SURGERY  07/2016   [facility] nose  . REPLACEMENT TOTAL KNEE  2013   RT  . TOTAL SHOULDER REPLACEMENT  2012   RT     Prior to Admission medications   Medication Sig Start Date End Date Taking? Authorizing Provider  baclofen (LIORESAL) 10 MG tablet TAKE 1 TABLET BY MOUTH THREE TIMES A DAY Patient taking differently: 2 (two) times daily.  10/27/17  Yes Glean Hess, MD  calcium-vitamin D (OSCAL WITH D) 500-200 MG-UNIT tablet Take 1 tablet by mouth.   Yes [provider]  cholecalciferol (VITAMIN D) 400 units TABS tablet Take 400 Units by mouth.   Yes [provider]  Cranberry 1000 MG CAPS Take by mouth.   Yes [provider]  diltiazem (CARDIZEM CD) 180 MG 24 hr capsule Take 1 capsule (180 mg total) daily by mouth. 04/14/17  Yes Gollan, Kathlene November, MD  diltiazem (CARDIZEM) 60 MG tablet  Take 60 mg by mouth 4 (four) times daily.   Yes [provider]  FLOVENT HFA 110 MCG/ACT inhaler TAKE 2 PUFFS BY MOUTH TWICE A DAY 01/30/18  Yes Laverle Hobby, MD  gabapentin (NEURONTIN) 300 MG capsule Take 1 capsule (300 mg total) by mouth 3 (three) times daily. Three capsules daily at bedtime. 11/08/17  Yes Glean Hess, MD  Ginger 500 MG CAPS Take by mouth.   Yes [provider]  Multiple Vitamins-Minerals (MULTIVITAMIN WITH MINERALS) tablet Take 1 tablet by mouth daily.   Yes [provider]  NON FORMULARY Protleotic Enzymes   Yes [provider]  Probiotic Product (PROBIOTIC DAILY PO) Take by mouth.   Yes [provider]  STRIVERDI RESPIMAT 2.5 MCG/ACT AERS INHALE 2 PUFFS INTO THE LUNGS DAILY. 01/30/18  Yes Laverle Hobby, MD  Turmeric 500 MG CAPS Take by mouth.   Yes [provider]  vitamin E 100 UNIT capsule Take by mouth daily.   Yes [provider]  Misc Natural Products (TART CHERRY ADVANCED PO) Take by mouth.    [provider]    Allergies as of 03/02/2018 - Review Complete 03/02/2018  Allergen Reaction Noted  . Aspirin Anaphylaxis 03/21/2017  . Ciprofloxacin Rash 08/28/2017  . Ciprofloxacin-dexamethasone Anaphylaxis 03/21/2017  . Crestor [rosuvastatin] Other (See Comments) 03/21/2017  . Morphine and related Nausea Only 07/04/2017  .  Amoxicillin Hives 03/21/2017  . Doxycycline hyclate Hives 03/21/2017  . Hydrochlorothiazide Other (See Comments) 03/21/2017  . Keflex [cephalexin] Hives 03/21/2017  . Lovastatin Other (See Comments) 03/21/2017  . Simvastatin Other (See Comments) 03/21/2017  . Erythromycin Hives 03/21/2017  . Ibuprofen Other (See Comments) 03/21/2017  . Neomycin-bacitracin zn-polymyx Hives and Rash 03/21/2017  . Omeprazole Palpitations 03/21/2017  . Tape Hives and Rash 03/21/2017    Family History  Problem Relation Age of Onset  . Diabetes Mother   . Stroke Mother   .  Valvular heart disease Father   . Breast cancer Neg Hx     Social History   Socioeconomic History  . Marital status: Widowed    Spouse name: Not on file  . Number of children: Not on file  . Years of education: Not on file  . Highest education level: Not on file  Occupational History  . Not on file  Social Needs  . Financial resource strain: Not on file  . Food insecurity:    Worry: Not on file    Inability: Not on file  . Transportation needs:    Medical: Not on file    Non-medical: Not on file  Tobacco Use  . Smoking status: Never Smoker  . Smokeless tobacco: Never Used  Substance and Sexual Activity  . Alcohol use: Yes    Comment: may drink 1-2x/yr  . Drug use: No  . Sexual activity: Never  Lifestyle  . Physical activity:    Days per week: Not on file    Minutes per session: Not on file  . Stress: Not on file  Relationships  . Social connections:    Talks on phone: Not on file    Gets together: Not on file    Attends religious service: Not on file    Active member of club or organization: Not on file    Attends meetings of clubs or organizations: Not on file    Relationship status: Not on file  . Intimate partner violence:    Fear of current or ex partner: Not on file    Emotionally abused: Not on file    Physically abused: Not on file    Forced sexual activity: Not on file  Other Topics Concern  . Not on file  Social History Narrative  . Not on file    Review of Systems: See HPI, otherwise negative ROS  Physical Exam: BP (!) 139/95   Pulse (!) 110   Temp 97.9 F (36.6 C) (Temporal)   Resp 16   Ht 5\' 6"  (1.676 m)   Wt 103.4 kg   SpO2 95%   BMI 36.80 kg/m  General:   Alert,  pleasant and cooperative in NAD Head:  Normocephalic and atraumatic. Neck:  Supple; no masses or thyromegaly. Lungs:  Clear throughout to auscultation.    Heart:  Regular rate and rhythm. Abdomen:  Soft, nontender and nondistended. Normal bowel sounds, without guarding,  and without rebound.   Neurologic:  Alert and  oriented x4;  grossly normal neurologically.  Impression/Plan: Audrey Martinez is here for an endoscopy and colonoscopy to be performed for colon cancer screening and GERD  Risks, benefits, limitations, and alternatives regarding  endoscopy and colonoscopy have been reviewed with the patient.  Questions have been answered.  All parties agreeable.   Sherri Sear, MD  03/21/2018, 8:15 AM

## 2018-03-23 ENCOUNTER — Encounter: Payer: Self-pay | Admitting: Gastroenterology

## 2018-03-25 ENCOUNTER — Encounter: Payer: Self-pay | Admitting: Gastroenterology

## 2018-04-05 ENCOUNTER — Ambulatory Visit (INDEPENDENT_AMBULATORY_CARE_PROVIDER_SITE_OTHER): Payer: Medicare Other | Admitting: Gastroenterology

## 2018-04-05 ENCOUNTER — Encounter: Payer: Self-pay | Admitting: Gastroenterology

## 2018-04-05 ENCOUNTER — Telehealth: Payer: Self-pay | Admitting: Gastroenterology

## 2018-04-05 VITALS — BP 125/89 | HR 71 | Ht 65.0 in | Wt 234.0 lb

## 2018-04-05 DIAGNOSIS — R490 Dysphonia: Secondary | ICD-10-CM | POA: Diagnosis not present

## 2018-04-05 NOTE — Progress Notes (Signed)
Cephas Darby, MD [address]  [place]  [place], [place] [address]  Main: [phone]  Fax: [phone]    Gastroenterology Consultation  Referring Provider:     Glean Hess, MD Primary Care Physician:  Glean Hess, MD Primary Gastroenterologist:  Dr. Cephas Darby Reason for Consultation: Dysphagia, mucus drainage        HPI:   Audrey Martinez is a 72 y.o. female referred by Dr. Army Melia, Jesse Sans, MD  for consultation & management of reflux.  Patient moved to [place] from [place] about an year ago. Since then, she reports exacerbation of asthma symptoms. She has been followed by Dr. Mortimer Fries for management of asthma.  Along with this, she has been experiencing burning sensation in her throat, throat discomfort as well as mucus drainage mixed with blood, sometimes difficulty swallowing.  She also has hoarseness of voice.  The symptoms are new for her.  She is taking loratadine once a day.   Patient has history of SVT, status post nodal ablation.  She is seeing Dr. Rockey Situ here  She has been receiving annual FIT testing in [place] when she was under [facility].  These have been negative to date.  Follow-up visit 04/05/2018 Patient underwent EGD which was unremarkable including biopsies negative for eosinophilic esophagitis. Patient has been taking loratadine for allergies and reports that her mucus drainage has significantly improved.   NSAIDs: None  Antiplts/Anticoagulants/Anti thrombotics: None  GI Procedures: EGD 10/13/2011 IMPRESSIONS: Normal esophagus. Four biopsies taken. Mild gastritis was found in the antrum and body of the stomach  (535.50). Two biopsies taken. Otherwise, the stomach appeared to be normal. Normal duodenum.  - Normal duodenal bulb and second portion of the duodenum. - Normal stomach. - Esophageal mucosal changes suspicious for eosinophilic esophagitis. Biopsied. - Esophagogastric landmarks identified. -  Normal gastroesophageal junction.  EGD 03/21/18 Impression: - Normal duodenal bulb and second portion of the duodenum. - Normal stomach. - Esophageal mucosal changes suspicious for eosinophilic esophagitis. Biopsied. - Esophagogastric landmarks identified. - Normal gastroesophageal junction.  Colonoscopy 03/21/18 - Perianal skin tags found on perianal exam. - The examined portion of the ileum was normal. - Two 5 to 8 mm polyps in the cecum, removed with a cold snare. Resected and retrieved. Clip (MR conditional) was placed. - One 9 mm polyp in the descending colon, removed with mucosal resection. Resected and retrieved. Clip (MR conditional) was placed. - The distal rectum and anal verge are normal on retroflexion view.    Past Medical History:  Diagnosis Date  . Arthritis   . Asthma   . GERD (gastroesophageal reflux disease)   . Herniated disc, cervical   . Left wrist fracture   . Stroke ([place]) 1998  . SVT (supraventricular tachycardia) (HCC)     Past Surgical History:  Procedure Laterality Date  . BACK SURGERY  2017   L2-4 laminectomy and foraminal stenosis  . BREAST CYST ASPIRATION Left 1977  . CARDIAC CATHETERIZATION  2009   [facility] in [place]  . CARDIAC ELECTROPHYSIOLOGY STUDY AND ABLATION  2009  . CATARACT EXTRACTION, BILATERAL  2010  . COLONOSCOPY WITH PROPOFOL N/A 03/21/2018   Procedure: COLONOSCOPY WITH Biopsy;  Surgeon: Lin Landsman, MD;  Location: [facility];  Service: Endoscopy;  Laterality: N/A;  . derrick procedure  1977-78   bilateral   . ESOPHAGOGASTRODUODENOSCOPY  2013   gastritis; done for complaint of dysphagia  . ESOPHAGOGASTRODUODENOSCOPY (EGD) WITH PROPOFOL N/A 03/21/2018  Procedure: ESOPHAGOGASTRODUODENOSCOPY (EGD) WITH Biopsies;  Surgeon: Lin Landsman, MD;  Location: [facility];  Service: Endoscopy;  Laterality: N/A;  KEEP THIS PATIENT FIRST  . lymph node removal     neck  . MOHS  SURGERY  07/2016   BCCA nose  . POLYPECTOMY N/A 03/21/2018   Procedure: POLYPECTOMY;  Surgeon: Lin Landsman, MD;  Location: [facility];  Service: Endoscopy;  Laterality: N/A;  . REPLACEMENT TOTAL KNEE  2013   RT  . TOTAL SHOULDER REPLACEMENT  2012   RT     Current Outpatient Medications:  .  baclofen (LIORESAL) 10 MG tablet, TAKE 1 TABLET BY MOUTH THREE TIMES A DAY (Patient taking differently: 2 (two) times daily. ), Disp: 270 tablet, Rfl: 1 .  calcium-vitamin D (OSCAL WITH D) 500-200 MG-UNIT tablet, Take 1 tablet by mouth., Disp: , Rfl:  .  cholecalciferol (VITAMIN D) 400 units TABS tablet, Take 400 Units by mouth., Disp: , Rfl:  .  Cranberry 1000 MG CAPS, Take by mouth., Disp: , Rfl:  .  diltiazem (CARDIZEM CD) 180 MG 24 hr capsule, Take 1 capsule (180 mg total) daily by mouth., Disp: 90 capsule, Rfl: 4 .  diltiazem (CARDIZEM) 60 MG tablet, Take 60 mg by mouth 4 (four) times daily., Disp: , Rfl:  .  FLOVENT HFA 110 MCG/ACT inhaler, TAKE 2 PUFFS BY MOUTH TWICE A DAY, Disp: 36 Inhaler, Rfl: 1 .  gabapentin (NEURONTIN) 300 MG capsule, Take 1 capsule (300 mg total) by mouth 3 (three) times daily. Three capsules daily at bedtime., Disp: 270 capsule, Rfl: 3 .  Ginger 500 MG CAPS, Take by mouth., Disp: , Rfl:  .  Misc Natural Products (TART CHERRY ADVANCED PO), Take by mouth., Disp: , Rfl:  .  Multiple Vitamins-Minerals (MULTIVITAMIN WITH MINERALS) tablet, Take 1 tablet by mouth daily., Disp: , Rfl:  .  NON FORMULARY, Protleotic Enzymes, Disp: , Rfl:  .  Probiotic Product (PROBIOTIC DAILY PO), Take by mouth., Disp: , Rfl:  .  STRIVERDI RESPIMAT 2.5 MCG/ACT AERS, INHALE 2 PUFFS INTO THE LUNGS DAILY., Disp: 12 g, Rfl: 2 .  Turmeric 500 MG CAPS, Take by mouth., Disp: , Rfl:  .  vitamin E 100 UNIT capsule, Take by mouth daily., Disp: , Rfl:     Family History  Problem Relation Age of Onset  . Diabetes Mother   . Stroke Mother   . Valvular heart disease Father   . Breast  cancer Neg Hx      Social History   Tobacco Use  . Smoking status: Never Smoker  . Smokeless tobacco: Never Used  Substance Use Topics  . Alcohol use: Yes    Comment: may drink 1-2x/yr  . Drug use: No    Allergies as of 04/05/2018 - Review Complete 04/05/2018  Allergen Reaction Noted  . Aspirin Anaphylaxis 03/21/2017  . Ciprofloxacin Rash 08/28/2017  . Ciprofloxacin-dexamethasone Anaphylaxis 03/21/2017  . Crestor [rosuvastatin] Other (See Comments) 03/21/2017  . Morphine and related Nausea Only 07/04/2017  . Amoxicillin Hives 03/21/2017  . Doxycycline hyclate Hives 03/21/2017  . Hydrochlorothiazide Other (See Comments) 03/21/2017  . Keflex [cephalexin] Hives 03/21/2017  . Lovastatin Other (See Comments) 03/21/2017  . Simvastatin Other (See Comments) 03/21/2017  . Erythromycin Hives 03/21/2017  . Ibuprofen Other (See Comments) 03/21/2017  . Neomycin-bacitracin zn-polymyx Hives and Rash 03/21/2017  . Omeprazole Palpitations 03/21/2017  . Tape Hives and Rash 03/21/2017    Review of Systems:    All systems reviewed and  negative except where noted in HPI.   Physical Exam:  BP 125/89   Pulse 71   Ht 5\' 5"  (3.536 m)   Wt 234 lb (106.1 kg)   BMI 38.94 kg/m  No LMP recorded. Patient is postmenopausal.  General:   Alert,  Well-developed, well-nourished, pleasant and cooperative in NAD Head:  Normocephalic and atraumatic. Eyes:  Sclera clear, no icterus.   Conjunctiva pink. Ears:  Normal auditory acuity. Nose:  No deformity, discharge, or lesions. Mouth:  No deformity or lesions,oropharynx pink & moist. Neck:  Supple; no masses or thyromegaly. Lungs:  Respirations even and unlabored.  Clear throughout to auscultation.   No wheezes, crackles, or rhonchi. No acute distress. Heart:  Regular rate and rhythm; no murmurs, clicks, rubs, or gallops. Abdomen:  Normal bowel sounds. Soft, non-tender and non-distended without masses, hepatosplenomegaly or hernias noted.  No guarding  or rebound tenderness.   Rectal: Not performed Msk:  Symmetrical without gross deformities. Good, equal movement & strength bilaterally. Pulses:  Normal pulses noted. Extremities:  No clubbing or edema.  No cyanosis. Neurologic:  Alert and oriented x3;  grossly normal neurologically. Skin:  Intact without significant lesions or rashes. No jaundice. Lymph Nodes:  No significant cervical adenopathy. Psych:  Alert and cooperative. Normal mood and affect.  Imaging Studies: Reviewed  Assessment and Plan:   Audrey Martinez is a 72 y.o. Caucasian female with obesity, seen in consultation for 1 year history of soreness in the throat, hoarseness of voice, dysphagia, bloody mucus discharge.  Part of the symptoms have improved on allergy medication.  She continues to have hoarseness of voice.  EGD unremarkable, no evidence of esophagitis.  Patient experienced SVTs on omeprazole in the past.  Will not try PPI at this time.  She tells me that she takes over-the-counter medication for stomach, does not recall the name of it.  She will call me back  -Continue loratadine for now  Tubular adenomas of colon Recommend surveillance colonoscopy in 02/2021   Follow up in 3 months   Cephas Darby, MD

## 2018-04-05 NOTE — Telephone Encounter (Signed)
Pt is calling to let Dr. Arthor Captain she takes rx Zumadadine and rx Laradidine

## 2018-04-06 NOTE — Telephone Encounter (Signed)
Medication has been confirmed with pt, and she is currently taking Cimetidine 200 mg for heartburn and she is also taking loratadine10 mg for allergy, and she wants to know what do you want to prescribe for her?

## 2018-04-09 DIAGNOSIS — L570 Actinic keratosis: Secondary | ICD-10-CM | POA: Diagnosis not present

## 2018-04-18 NOTE — Telephone Encounter (Signed)
Pt has increased dosage to 400 mg daily and states she is doing well with this medication increase

## 2018-04-23 NOTE — Progress Notes (Signed)
Cardiology Office Note Date:  04/24/2018  Patient ID:  Audrey Martinez, Audrey Martinez June 13, 1945, MRN [id number] PCP:  Glean Hess, MD  Cardiologist:  Dr. Rockey Situ, MD    Chief Complaint: Follow up  History of Present Illness: Audrey Martinez is a 72 y.o. female with history of SVT/atrial tachycardia status post ablation x2 as detailed below, TIA, hypertension, hyperlipidemia, asthma, migraine disorder, and GERD who presents for follow-up of tachypalpitations.  Patient previously followed by cardiology in [place].  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  No sustained atrial tach/no inducible atrial flutter.  She underwent successful radiofrequency ablation of AV nodal slow pathway.  Following this, she had recurrent symptoms shortly thereafter and had an event monitor and repeat EP study on 01/21/2008: Markedly prolonged at baseline AH interval (AV nodal function is subnormal).  No inducible tachycardia; only inducible double echo beats.  As per EP, event monitor with ectopic atrial tachycardia versus atrial flutter with 2-1 block.  Notes indicated she did not want anticoagulation.  Prior echocardiogram from 12/13/2006 showed an LVEF of 55 to 60%, no significant valvular abnormalities.  Notes indicate she was noted to have episodes of atrial flutter at a controlled rate.  ETT on 09/28/2007 showed Bruce protocol with the patient exercising for 5-minutes and 1 second achieving 7 METS with a maximum heart rate of 179 bpm (111% MPHR).  Pretest EKG was normal with a heart rate of 96 bpm.  No chest pain noted during test.  No significant ischemia was noted.  Stress echo on 11/14/2008 showed Bruce protocol with the patient exercising for 4 minutes and 31 seconds achieving 4.6 METS and 113% MPHR.  Echocardiogram showed an LVEF of 60% with no significant ischemia.  She established with Dr. Rockey Situ on 04/14/2017 as a self-referral and denied any  symptoms of chest pain concerning for angina.  She reported having periodic palpitations.  It was recommended she increase her diltiazem to 180 mg daily.  She has declined statin for her hyperlipidemia as well as aspirin, Plavix, or anticoagulation in the setting of her TIA and reported atrial flutter.  Patient comes in stating she had done well since her last visit noting only a rare PRN short acting diltiazem.  However, on 04/23/2018 at around 7:20 AM she noted a return of her tachypalpitations that felt similar to her prior episodes while living in [place].  Her blood pressure was noted to be 140/72 at that time with a heart rate of 140 bpm.  She was not lightheaded.  She took a 60 mg short acting diltiazem.  At 8:14 AM her blood pressure was 100/71 with a heart rate of 134 bpm.  At 8:52 AM her blood pressure was 101/73 with a rate of 132 bpm.  She took her regular scheduled long-acting Cardizem CD 180 mg daily at that time.  At 10:15 AM blood pressure was 100/72 with a heart rate of 117 bpm.  At 10:34 AM pressure was 120/76 with heart rate of 104 bpm and patient was active at that time.  She noted the dizziness persisted until approximately 2 PM.  Since then, she has noted heart rates in the upper 90s to low 100s beats per minute.  She indicates her heart rate typically runs in the upper 80s to upper 90s at rest.  There was no associated chest pain, shortness of breath, presyncope, or syncope.  No falls since she was last seen.  She denies any  recent GI illnesses increased pain, or trauma.  She was previously taking over-the-counter potassium and magnesium supplements though has changed brands recently and wonders if this could be playing a role in her tachypalpitations.  She is currently asymptomatic.   Past Medical History:  Diagnosis Date  . Arthritis   . Asthma   . GERD (gastroesophageal reflux disease)   . Herniated disc, cervical   . Left wrist fracture   . Stroke ([place]) 1998  . SVT  (supraventricular tachycardia) (HCC)     Past Surgical History:  Procedure Laterality Date  . BACK SURGERY  2017   L2-4 laminectomy and foraminal stenosis  . BREAST CYST ASPIRATION Left 1977  . CARDIAC CATHETERIZATION  2009   [facility] in [place]  . CARDIAC ELECTROPHYSIOLOGY STUDY AND ABLATION  2009  . CATARACT EXTRACTION, BILATERAL  2010  . COLONOSCOPY WITH PROPOFOL N/A 03/21/2018   Procedure: COLONOSCOPY WITH Biopsy;  Surgeon: Lin Landsman, MD;  Location: [facility];  Service: Endoscopy;  Laterality: N/A;  . derrick procedure  1977-78   bilateral   . ESOPHAGOGASTRODUODENOSCOPY  2013   gastritis; done for complaint of dysphagia  . ESOPHAGOGASTRODUODENOSCOPY (EGD) WITH PROPOFOL N/A 03/21/2018   Procedure: ESOPHAGOGASTRODUODENOSCOPY (EGD) WITH Biopsies;  Surgeon: Lin Landsman, MD;  Location: [facility];  Service: Endoscopy;  Laterality: N/A;  KEEP THIS PATIENT FIRST  . lymph node removal     neck  . MOHS SURGERY  07/2016   BCCA nose  . POLYPECTOMY N/A 03/21/2018   Procedure: POLYPECTOMY;  Surgeon: Lin Landsman, MD;  Location: [facility];  Service: Endoscopy;  Laterality: N/A;  . REPLACEMENT TOTAL KNEE  2013   RT  . TOTAL SHOULDER REPLACEMENT  2012   RT     Current Meds  Medication Sig  . baclofen (LIORESAL) 10 MG tablet TAKE 1 TABLET BY MOUTH THREE TIMES A DAY (Patient taking differently: 2 (two) times daily. )  . calcium-vitamin D (OSCAL WITH D) 500-200 MG-UNIT tablet Take 1 tablet by mouth.  . cholecalciferol (VITAMIN D) 400 units TABS tablet Take 400 Units by mouth.  . Cranberry 1000 MG CAPS Take by mouth.  . diltiazem (CARDIZEM CD) 180 MG 24 hr capsule Take 1 capsule (180 mg total) daily by mouth.  . diltiazem (CARDIZEM) 60 MG tablet Take 60 mg by mouth 4 (four) times daily.  Marland Kitchen FLOVENT HFA 110 MCG/ACT inhaler TAKE 2 PUFFS BY MOUTH TWICE A DAY  . gabapentin (NEURONTIN) 300 MG capsule Take 1 capsule (300  mg total) by mouth 3 (three) times daily. Three capsules daily at bedtime.  . Ginger 500 MG CAPS Take by mouth.  . Misc Natural Products (TART CHERRY ADVANCED PO) Take by mouth.  . Multiple Vitamins-Minerals (MULTIVITAMIN WITH MINERALS) tablet Take 1 tablet by mouth daily.  . NON FORMULARY Protleotic Enzymes  . Probiotic Product (PROBIOTIC DAILY PO) Take by mouth.  . STRIVERDI RESPIMAT 2.5 MCG/ACT AERS INHALE 2 PUFFS INTO THE LUNGS DAILY.  . Turmeric 500 MG CAPS Take by mouth.  . vitamin E 100 UNIT capsule Take by mouth daily.    Allergies:   Aspirin; Ciprofloxacin; Ciprofloxacin-dexamethasone; Crestor [rosuvastatin]; Morphine and related; Amoxicillin; Doxycycline hyclate; Hydrochlorothiazide; Keflex [cephalexin]; Lovastatin; Simvastatin; Erythromycin; Ibuprofen; Neomycin-bacitracin zn-polymyx; Omeprazole; and Tape   Social History:  The patient  reports that she has never smoked. She has never used smokeless tobacco. She reports that she drinks alcohol. She reports that she does not use drugs.   Family History:  The patient's family history includes Diabetes in her mother; Stroke in her mother; Valvular heart disease in her father.  ROS:   Review of Systems  Constitutional: Positive for malaise/fatigue. Negative for chills, diaphoresis, fever and weight loss.  HENT: Negative for congestion.   Eyes: Negative for discharge and redness.  Respiratory: Negative for cough, hemoptysis, sputum production, shortness of breath and wheezing.   Cardiovascular: Positive for palpitations. Negative for chest pain, orthopnea, claudication, leg swelling and PND.  Gastrointestinal: Negative for abdominal pain, blood in stool, heartburn, melena, nausea and vomiting.  Genitourinary: Negative for hematuria.  Musculoskeletal: Negative for falls and myalgias.  Skin: Negative for rash.  Neurological: Positive for dizziness. Negative for tingling, tremors, sensory change, speech change, focal weakness, loss of  consciousness and weakness.  Endo/Heme/Allergies: Does not bruise/bleed easily.  Psychiatric/Behavioral: Negative for substance abuse. The patient is not nervous/anxious.   All other systems reviewed and are negative.    PHYSICAL EXAM:  VS:  BP 110/80 (BP Location: Left Arm, Patient Position: Sitting, Cuff Size: Large)   Pulse (!) 110   Ht 5\' 6"  (1.676 m)   Wt 237 lb 8 oz (107.7 kg)   BMI 38.33 kg/m  BMI: Body mass index is 38.33 kg/m.  Physical Exam  Constitutional: She is oriented to person, place, and time. She appears well-developed and well-nourished.  HENT:  Head: Normocephalic and atraumatic.  Eyes: Right eye exhibits no discharge. Left eye exhibits no discharge.  Neck: Normal range of motion. No JVD present.  Cardiovascular: Regular rhythm, S1 normal, S2 normal and normal heart sounds. Tachycardia present. Exam reveals no distant heart sounds, no friction rub, no midsystolic click and no opening snap.  No murmur heard. Pulses:      Posterior tibial pulses are 2+ on the right side, and 2+ on the left side.  Pulmonary/Chest: Effort normal and breath sounds normal. No respiratory distress. She has no decreased breath sounds. She has no wheezes. She has no rales. She exhibits no tenderness.  Abdominal: Soft. She exhibits no distension. There is no tenderness.  Musculoskeletal: She exhibits no edema.  Neurological: She is alert and oriented to person, place, and time.  Skin: Skin is warm and dry. No cyanosis. Nails show no clubbing.  Psychiatric: She has a normal mood and affect. Her speech is normal and behavior is normal. Judgment and thought content normal.     EKG:  Was ordered and interpreted by me today. Shows sinus tachycardia, 110 bpm, 1st degree AV block, LVH, poor R wave progression, nonspecific st/t changes   Recent Labs: 06/06/2017: ALT 17; BUN 25; Creatinine, Ser 1.05; Potassium 4.4; Sodium 138  No results found for requested labs within last 8760 hours.   CrCl  cannot be calculated (Patient's most recent lab result is older than the maximum 21 days allowed.).   Wt Readings from Last 3 Encounters:  04/24/18 237 lb 8 oz (107.7 kg)  04/05/18 234 lb (106.1 kg)  03/21/18 228 lb (103.4 kg)     Other studies reviewed: Additional studies/records reviewed today include: summarized above  ASSESSMENT AND PLAN:  1. Tachypalpitations with history of SVT status post ablation as well as atrial tachycardia with possible atrial flutter: Schedule ZIO monitor.  Check CBC, CMP, TSH, and magnesium.  For now, we will continue her on Cardizem CD 180 mg daily with a PRN short acting diltiazem 60 mg for tachypalpitations and heart rate greater than 100 bpm.  Patient has a reported history of possible atrial flutter though  has previously refused anticoagulation secondary to easy bruising.  Await ZIO results.  May need to establish with EP.  2. TIA: Has previously refused aspirin, Plavix, and warfarin.  Outpatient cardiac monitoring as above.    3. Hypertension: Blood pressure well controlled.  Continue Cardizem as above.  Disposition: F/u with Dr. Rockey Situ or an APP in mid to late December, 2019.  Current medicines are reviewed at length with the patient today.  The patient did not have any concerns regarding medicines.  Signed, Christell Faith, PA-C 04/24/2018 10:46 AM     [facility] [address] [place] [address] [place], [place] [address] [phone]

## 2018-04-24 ENCOUNTER — Ambulatory Visit (INDEPENDENT_AMBULATORY_CARE_PROVIDER_SITE_OTHER): Payer: Medicare Other | Admitting: Physician Assistant

## 2018-04-24 ENCOUNTER — Ambulatory Visit (INDEPENDENT_AMBULATORY_CARE_PROVIDER_SITE_OTHER): Payer: Medicare Other

## 2018-04-24 ENCOUNTER — Encounter: Payer: Self-pay | Admitting: Physician Assistant

## 2018-04-24 VITALS — BP 110/80 | HR 110 | Ht 66.0 in | Wt 237.5 lb

## 2018-04-24 DIAGNOSIS — I471 Supraventricular tachycardia: Secondary | ICD-10-CM

## 2018-04-24 DIAGNOSIS — R002 Palpitations: Secondary | ICD-10-CM | POA: Diagnosis not present

## 2018-04-24 DIAGNOSIS — Z8673 Personal history of transient ischemic attack (TIA), and cerebral infarction without residual deficits: Secondary | ICD-10-CM

## 2018-04-24 DIAGNOSIS — I10 Essential (primary) hypertension: Secondary | ICD-10-CM

## 2018-04-24 NOTE — Patient Instructions (Signed)
Medication Instructions:  No changes  If you need a refill on your cardiac medications before your next appointment, please call your pharmacy.   Lab work: Your provider would like for you to have the following labs today: BMET, CBC, Magnesium, TSH  If you have labs (blood work) drawn today and your tests are completely normal, you will receive your results only by: Marland Kitchen MyChart Message (if you have MyChart) OR . A paper copy in the mail If you have any lab test that is abnormal or we need to change your treatment, we will call you to review the results.  Testing/Procedures: Your physician has requested that you have an echocardiogram. Echocardiography is a painless test that uses sound waves to create images of your heart. It provides your doctor with information about the size and shape of your heart and how well your heart's chambers and valves are working. You may receive an ultrasound enhancing agent through an IV if needed to better visualize your heart during the echo.This procedure takes approximately one hour. There are no restrictions for this procedure. This will take place at the [facility].   Your physician has recommended that you wear a 14 day Zio event monitor. Event monitors are medical devices that record the heart's electrical activity. Doctors most often [place] these monitors to diagnose arrhythmias. Arrhythmias are problems with the speed or rhythm of the heartbeat. The monitor is a small, portable device. You can wear one while you do your normal daily activities. This is usually used to diagnose what is causing palpitations/syncope (passing out).    Follow-Up: Your physician recommends that you schedule a follow-up appointment in: late December with Dr. Rockey Situ.

## 2018-04-25 ENCOUNTER — Telehealth: Payer: Self-pay | Admitting: *Deleted

## 2018-04-25 LAB — CBC
Hematocrit: 44.8 % (ref 34.0–46.6)
Hemoglobin: 15.5 g/dL (ref 11.1–15.9)
MCH: 31.6 pg (ref 26.6–33.0)
MCHC: 34.6 g/dL (ref 31.5–35.7)
MCV: 91 fL (ref 79–97)
PLATELETS: 249 10*3/uL (ref 150–450)
RBC: 4.91 x10E6/uL (ref 3.77–5.28)
RDW: 12 % — AB (ref 12.3–15.4)
WBC: 6 10*3/uL (ref 3.4–10.8)

## 2018-04-25 LAB — BASIC METABOLIC PANEL
BUN/Creatinine Ratio: 15 (ref 12–28)
BUN: 15 mg/dL (ref 8–27)
CHLORIDE: 104 mmol/L (ref 96–106)
CO2: 21 mmol/L (ref 20–29)
Calcium: 10 mg/dL (ref 8.7–10.3)
Creatinine, Ser: 1.01 mg/dL — ABNORMAL HIGH (ref 0.57–1.00)
GFR calc Af Amer: 65 mL/min/{1.73_m2} (ref >59)
GFR calc non Af Amer: 57 mL/min/{1.73_m2} — ABNORMAL LOW (ref >59)
Glucose: 109 mg/dL — ABNORMAL HIGH (ref 65–99)
POTASSIUM: 4.2 mmol/L (ref 3.5–5.2)
SODIUM: 142 mmol/L (ref 134–144)

## 2018-04-25 LAB — TSH: TSH: 1.91 u[IU]/mL (ref 0.450–4.500)

## 2018-04-25 LAB — MAGNESIUM: Magnesium: 2.3 mg/dL (ref 1.6–2.3)

## 2018-04-25 NOTE — Telephone Encounter (Signed)
Results called to pt. Pt verbalized understanding. Says her HR was 122 this morning when waking up. States she feels a little lightheaded. She is resting in her chair. She is due to take her Diltiazem 180 mg at 0900 am.  She says yesterday that dose was working well. She will continue with plan of long and short acting diltiazem and call us if needed.

## 2018-04-25 NOTE — Telephone Encounter (Signed)
-----   Message from Rise Mu, PA-C sent at 04/25/2018  7:43 AM EST ----- Thyroid function normal Random glucose okay at 109 Renal function is minimally elevated though at her approximate baseline Potassium at goal Magnesium at goal Blood count normal  Overall reassuring labs If symptoms are stable, would recommend continuation of current dose of long-acting diltiazem with continued PRN short acting diltiazem while we evaluate her ectopy on cardiac monitoring.  If symptoms are persistent and she is symptomatic we could increase her dose of Cardizem to 240 mg daily.

## 2018-05-08 ENCOUNTER — Other Ambulatory Visit: Payer: Self-pay | Admitting: Physician Assistant

## 2018-05-08 DIAGNOSIS — I471 Supraventricular tachycardia: Secondary | ICD-10-CM

## 2018-05-14 DIAGNOSIS — R002 Palpitations: Secondary | ICD-10-CM | POA: Diagnosis not present

## 2018-05-18 ENCOUNTER — Ambulatory Visit (INDEPENDENT_AMBULATORY_CARE_PROVIDER_SITE_OTHER): Payer: Medicare Other

## 2018-05-18 DIAGNOSIS — I471 Supraventricular tachycardia: Secondary | ICD-10-CM | POA: Diagnosis not present

## 2018-05-21 ENCOUNTER — Telehealth: Payer: Self-pay

## 2018-05-21 DIAGNOSIS — I471 Supraventricular tachycardia: Secondary | ICD-10-CM

## 2018-05-21 DIAGNOSIS — R9431 Abnormal electrocardiogram [ECG] [EKG]: Secondary | ICD-10-CM

## 2018-05-21 DIAGNOSIS — I10 Essential (primary) hypertension: Secondary | ICD-10-CM

## 2018-05-21 MED ORDER — METOPROLOL TARTRATE 100 MG PO TABS: 100.0000 mg | ORAL_TABLET | Freq: Once | ORAL | 0 refills | Status: DC

## 2018-05-21 NOTE — Telephone Encounter (Signed)
-----   Message from Rise Mu, PA-C sent at 05/20/2018  1:50 PM EST ----- Heart monitor showed NSR with an average heart rate of 87 bpm. First degree AV block and Mobitz type I were noted. Brief run of tachycardia that follows same morphology of her sinus rate. This was called NSVT, though unable to exclude SVT with the fastest and longest run being 16 beats with an average rate of 183 bpm (max rate 210 bpm). Isolated PACS, atrial triplets, PVCs, ventricular couplets, ventricular triplets were rare. Ventricular bigeminy was noted. Patient triggered events were not associated with significant arrhythmia.   Will need to discuss further with EP. Given drop in her EF on most recent echo when compared to studies done in [place], with short run of ventricular bigeminy I recommend cardiac CT to exclude significant ischemia. Please schedule cardiac CT with FFR.

## 2018-05-21 NOTE — Telephone Encounter (Signed)
Her heart rate is typically in the 80 to 90 bpm range. Let's follow the posted guidelines meaning she would get Lopressor 100 mg x 1 prior to the study.

## 2018-05-21 NOTE — Telephone Encounter (Signed)
Call to patient to review zio monitor results. Pt verbalizes concern r/t First degree block.  Shared with her the plan of care from Christell Faith, PA to order Cardiac CT.  Pt agreeable with plan.  I discussed that Lady Gary will call with the details of this appt.    She has f/u with [facility] on 12/31 @ 8:20AM. Advised pt to call for any further questions or concerns   See below letter sent to pt for CT instructions.  "Ms. Armando Reichert;  It has been requested by your cardiologist, Christell Faith, PA, to have a Cardiac CT. You will be called by scheduling for more information. Listed below are instructions for your test.  Please arrive at the [facility] main entrance of [facility] at xx:xx AM (30-45 minutes prior to test start time). ?  [facility]  [address]  [place], [place] [address]  [phone]  Proceed to the [facility] Radiology Department (First Floor).  Please follow these instructions carefully (unless otherwise directed):  30 days prior to test:  Report to medical mall @ [facility] for blood work Artist).  On the Night Before the Test:  Be sure to Drink plenty of water.  Do not consume any caffeinated/decaffeinated beverages or chocolate 12 hours prior to your test.  Do not take any antihistamines 12 hours prior to your test.  On the Day of the Test:  Drink plenty of water. Do not drink any water within one hour of the test.  Do not eat any food 4 hours prior to the test.  You may take your regular medications prior to the test.  Take Metoprolol (Lopressor) 2 hours before your test.  It may take a few weeks for scheduling to contact you for the date and time of test. Please report to medical mall for lab work within 30 days of scheduled CT. Also, call us when you have the date and time so we can schedule your follow up to go over results.  Please call with any questions or concerns,  Verlon Au, Therapist, sports, BSN  [place]"  Message routed to [employer] for metoprolol dosing for Cardiac Ct.

## 2018-05-28 DIAGNOSIS — B078 Other viral warts: Secondary | ICD-10-CM | POA: Diagnosis not present

## 2018-05-28 DIAGNOSIS — L578 Other skin changes due to chronic exposure to nonionizing radiation: Secondary | ICD-10-CM | POA: Diagnosis not present

## 2018-05-28 DIAGNOSIS — L700 Acne vulgaris: Secondary | ICD-10-CM | POA: Diagnosis not present

## 2018-05-28 DIAGNOSIS — L821 Other seborrheic keratosis: Secondary | ICD-10-CM | POA: Diagnosis not present

## 2018-05-28 DIAGNOSIS — L570 Actinic keratosis: Secondary | ICD-10-CM | POA: Diagnosis not present

## 2018-05-28 NOTE — Progress Notes (Signed)
Cardiology Office Note  Date:  05/29/2018   ID:  Audrey, Martinez 02/01/1946, MRN [id number]  PCP:  Glean Hess, MD   Chief Complaint  Patient presents with  . other    1 month follow up from [facility] and Echo. Meds reviewed by the pt. verbally. Pt. c/o dizziness.      HPI:  Audrey Martinez is a 72 year old woman with history of Chronic sinus tachycardia SVT, atrial tachycardia Ablation x 2 PNA as a child, chronic lung disease, chronic cough/asthma.  Non-smoker  Who presents by self referral for history of atrial tachycardia, SVT  In follow-up today she reports that she feels well Going to start a new diet, Mediterranean diet, heart healthy He does report daytime somnolence, dentist reports she is grinding her teeth Naps frequently in the daytime No regular exercise program  Recent imaging studies discussed with her Echocardiogram May 18, 2018 Ejection fraction 45 to 50% mild diffuse hypokinesis unable to exclude regional wall motion abnormalities Concerning for anteroseptal and apical hypokinesis  Event monitor Wide-complex tachycardia 2 runs longest was 16 beats Ectopy, PVCs noted Patient triggered events were not associated with significant arrhythmia  On prior office visit reported having palpitations November 2019 She took diltiazem as needed Also takes Cardizem 180 daily  Problems in the past on b-blockers, Causes migraines, asthma  Scheduled first cardiac CTA but unable to get heart rate down  Orthostatics done today showing drop in blood pressure 132 down to 272 systolic with standing heart rate 96 up to 120 recovery after 3 minutes  EKG personally reviewed by myself on todays visit Shows sinus tachycardia rate 101 bpm no significant ST or T wave changes  Home All the past medical history reviewed Notes from [place] cardiologist Notes indicate 1. Dual AV nodal physiology. 2. Typical slow fast AV nodal reentrant tachycardia. 3. Nonsustained  AT / No inducible atrial flutter  She underwent s/p Successful radiofrequency ablation of AV nodal slow pathway.   She had recurrent symptoms shortly after, had event monitor  repeat EPS 01/21/08:  1. Markedly prolonged baseline AH interval (AV nodal function is subnormal)  2. No induceable tachycardia; Only induceable double echo beats As per EP,  event monitor with ectopic atrial tachycardia versus atrial flutter with 2:1 block.   Notes indicating she did not want anticoagulation  PRIOR CARDIAC STUDIES: -ECHOCARDIOGRAM 12/13/06: LVEF 55-60%. No significant valvular abnormalities. Noted to have episodes of atrial flutter at controlled rate. -ETT 09/28/07: Bruce protocol 5:01 minutes. 7 METS. Maximal HR 179 bpm (111% MPHR). Pretest ECG normal with HR 96 bpm. No chest pain. No significant ischemia.  -STRESS ECHO 11/14/08: Bruce protocol 4:31 minutes. 4.6 METS. 113% MPHR. LVEF 60%. No significant ischemia.  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 -Hypertension -Hyperlipidemia -History of TIA -Asthma -History of migraines -Right cataract and history of right retinal bleeding over a year ago, now resolved -S/P umbilical hernia repair -S/P tonsillectomy -S/P multiple wrist surgeries -S/P excision of right supraclavicular neck mass (lymph node) -S/P cataract surgery   PMH:   has a past medical history of Arthritis, Asthma, GERD (gastroesophageal reflux disease), Herniated disc, cervical, Left wrist fracture, Stroke ([place]) (1998), and SVT (supraventricular tachycardia) ([place]).  PSH:    Past Surgical History:  Procedure Laterality Date  . BACK SURGERY  2017   L2-4 laminectomy and foraminal stenosis  . BREAST CYST ASPIRATION Left 1977  . CARDIAC CATHETERIZATION  2009   [facility] in [place]  . CARDIAC ELECTROPHYSIOLOGY  STUDY AND ABLATION  2009  . CATARACT EXTRACTION, BILATERAL  2010  . COLONOSCOPY WITH PROPOFOL N/A 03/21/2018   Procedure: COLONOSCOPY WITH  Biopsy;  Surgeon: Lin Landsman, MD;  Location: [facility];  Service: Endoscopy;  Laterality: N/A;  . derrick procedure  1977-78   bilateral   . ESOPHAGOGASTRODUODENOSCOPY  2013   gastritis; done for complaint of dysphagia  . ESOPHAGOGASTRODUODENOSCOPY (EGD) WITH PROPOFOL N/A 03/21/2018   Procedure: ESOPHAGOGASTRODUODENOSCOPY (EGD) WITH Biopsies;  Surgeon: Lin Landsman, MD;  Location: [facility];  Service: Endoscopy;  Laterality: N/A;  KEEP THIS PATIENT FIRST  . lymph node removal     neck  . MOHS SURGERY  07/2016   BCCA nose  . POLYPECTOMY N/A 03/21/2018   Procedure: POLYPECTOMY;  Surgeon: Lin Landsman, MD;  Location: [facility];  Service: Endoscopy;  Laterality: N/A;  . REPLACEMENT TOTAL KNEE  2013   RT  . TOTAL SHOULDER REPLACEMENT  2012   RT     Current Outpatient Medications  Medication Sig Dispense Refill  . baclofen (LIORESAL) 10 MG tablet TAKE 1 TABLET BY MOUTH THREE TIMES A DAY (Patient taking differently: 2 (two) times daily. ) 270 tablet 1  . calcium-vitamin D (OSCAL WITH D) 500-200 MG-UNIT tablet Take 1 tablet by mouth.    . cholecalciferol (VITAMIN D) 400 units TABS tablet Take 400 Units by mouth.    . Cranberry 1000 MG CAPS Take by mouth.    . diltiazem (CARDIZEM CD) 180 MG 24 hr capsule Take 1 capsule (180 mg total) daily by mouth. 90 capsule 4  . diltiazem (CARDIZEM) 60 MG tablet Take 60 mg by mouth 4 (four) times daily.    Marland Kitchen FLOVENT HFA 110 MCG/ACT inhaler TAKE 2 PUFFS BY MOUTH TWICE A DAY 36 Inhaler 1  . gabapentin (NEURONTIN) 300 MG capsule Take 1 capsule (300 mg total) by mouth 3 (three) times daily. Three capsules daily at bedtime. 270 capsule 3  . Ginger 500 MG CAPS Take by mouth.    . metoprolol tartrate (LOPRESSOR) 100 MG tablet Take 1 tablet (100 mg total) by mouth once for 1 dose. Please take 1 tablet (100 mg total) once on the morning of Cardiac CT, 2 hours prior to study. 1 tablet 0  . Misc Natural Products  (TART CHERRY ADVANCED PO) Take by mouth.    . Multiple Vitamins-Minerals (MULTIVITAMIN WITH MINERALS) tablet Take 1 tablet by mouth daily.    . NON FORMULARY Protleotic Enzymes    . Probiotic Product (PROBIOTIC DAILY PO) Take by mouth.    . STRIVERDI RESPIMAT 2.5 MCG/ACT AERS INHALE 2 PUFFS INTO THE LUNGS DAILY. 12 g 2  . Turmeric 500 MG CAPS Take by mouth.    . vitamin E 100 UNIT capsule Take by mouth daily.     No current facility-administered medications for this visit.      Allergies:   Aspirin; Ciprofloxacin; Ciprofloxacin-dexamethasone; Crestor [rosuvastatin]; Morphine and related; Amoxicillin; Doxycycline hyclate; Hydrochlorothiazide; Keflex [cephalexin]; Lovastatin; Simvastatin; Erythromycin; Ibuprofen; Neomycin-bacitracin zn-polymyx; Omeprazole; and Tape   Social History:  The patient  reports that she has never smoked. She has never used smokeless tobacco. She reports current alcohol use. She reports that she does not use drugs.   Family History:   family history includes Diabetes in her mother; Stroke in her mother; Valvular heart disease in her father.    Review of Systems: Review of Systems  Constitutional: Negative.        Daytime somnolence  Respiratory: Negative.   Cardiovascular: Negative.   Gastrointestinal: Negative.   Musculoskeletal: Negative.   Neurological: Negative.   Psychiatric/Behavioral: Negative.   All other systems reviewed and are negative.    PHYSICAL EXAM: VS:  BP 112/80 (BP Location: Left Arm, Patient Position: Sitting, Cuff Size: Normal)   Pulse (!) 101   Ht 5\' 6"  (1.676 m)   Wt 234 lb 12 oz (106.5 kg)   BMI 37.89 kg/m  , BMI Body mass index is 37.89 kg/m. GEN: Well nourished, well developed, in no acute distress , obese HEENT: normal  Neck: no JVD, carotid bruits, or masses Cardiac: RRR; no murmurs, rubs, or gallops,no edema  Respiratory:  clear to auscultation bilaterally, normal work of breathing GI: soft, nontender, nondistended, +  BS MS: no deformity or atrophy  Skin: warm and dry, no rash Neuro:  Strength and sensation are intact Psych: euthymic mood, full affect   Recent Labs: 06/06/2017: ALT 17 04/24/2018: BUN 15; Creatinine, Ser 1.01; Hemoglobin 15.5; Magnesium 2.3; Platelets 249; Potassium 4.2; Sodium 142; TSH 1.910    Lipid Panel No results found for: CHOL, HDL, LDLCALC, TRIG    Wt Readings from Last 3 Encounters:  05/29/18 234 lb 12 oz (106.5 kg)  04/24/18 237 lb 8 oz (107.7 kg)  04/05/18 234 lb (106.1 kg)       ASSESSMENT AND PLAN:  SVT (supraventricular tachycardia) ([place]) - Plan: EKG 12-Lead Prior ablation denies having any SVT,  Continue diltiazem Baseline chronic tachycardia  Atrial tachycardia ([place]) - Plan: EKG 12-Lead Continue current medications Recent monitor showing short run VT and PVCs Plan Nonsustained VT Showed on the monitor, pulled up in the office today Discussed with her in detail, stress test has been ordered She does not want beta-blockers given history of asthma and migraines Discussed mildly depressed ejection fraction Possibly exacerbated by poor imaging, needs Definity  Mixed hyperlipidemia - Plan: EKG 12-Lead She does not want a statin  Personal history of transient ischemic attack (TIA), and cerebral infarction without residual deficits Does not want aspirin, Plavix or warfarin Monitor did not show arrhythmia such as atrial fibrillation or flutter  Hypertension Orthostatic on today's visit, recommended she hydrate better  Reactive airway disease/asthma On inhalers, Avoid beta-blockers Needs sleep study  Disposition:   F/U  12 months   Total encounter time more than 25 minutes  Greater than 50% was spent in counseling and coordination of care with the patient    Orders Placed This Encounter  Procedures  . EKG 12-Lead     Signed, Esmond Plants, M.D., Ph.D. 05/29/2018  [place], [place]

## 2018-05-29 ENCOUNTER — Encounter: Payer: Self-pay | Admitting: Cardiovascular Disease

## 2018-05-29 ENCOUNTER — Ambulatory Visit (INDEPENDENT_AMBULATORY_CARE_PROVIDER_SITE_OTHER): Payer: Medicare Other | Admitting: Cardiovascular Disease

## 2018-05-29 VITALS — BP 112/80 | HR 101 | Ht 66.0 in | Wt 234.8 lb

## 2018-05-29 DIAGNOSIS — I471 Supraventricular tachycardia: Secondary | ICD-10-CM | POA: Diagnosis not present

## 2018-05-29 DIAGNOSIS — J4540 Moderate persistent asthma, uncomplicated: Secondary | ICD-10-CM | POA: Diagnosis not present

## 2018-05-29 DIAGNOSIS — E782 Mixed hyperlipidemia: Secondary | ICD-10-CM

## 2018-05-29 DIAGNOSIS — I429 Cardiomyopathy, unspecified: Secondary | ICD-10-CM | POA: Diagnosis not present

## 2018-05-29 NOTE — Patient Instructions (Addendum)
We will cancel CT scan heart  We will order a lexiscan myoview for depressed EF [place] caregiver has ordered a Stress Test with nuclear imaging. The purpose of this test is to evaluate the blood supply to your heart muscle. This procedure is referred to as a "Non-Invasive Stress Test." This is because other than having an IV started in your vein, nothing is inserted or "invades" your body. Cardiac stress tests are done to find areas of poor blood flow to the heart by determining the extent of coronary artery disease (CAD). Some patients exercise on a treadmill, which naturally increases the blood flow to your heart, while others who are  unable to walk on a treadmill due to physical limitations have a pharmacologic/chemical stress agent called Lexiscan . This medicine will mimic walking on a treadmill by temporarily increasing your coronary blood flow.   Please note: these test may take anywhere between 2-4 hours to complete  PLEASE REPORT TO [facility] AT THE FIRST DESK WILL DIRECT YOU WHERE TO GO  Date of Procedure:_____________________________________  Arrival Time for Procedure:______________________________     PLEASE NOTIFY THE OFFICE AT LEAST 24 HOURS IN ADVANCE IF YOU ARE UNABLE TO KEEP YOUR APPOINTMENT.  [phone] AND  PLEASE NOTIFY NUCLEAR MEDICINE AT [facility] AT LEAST 24 HOURS IN ADVANCE IF YOU ARE UNABLE TO KEEP YOUR APPOINTMENT. [phone]  How to prepare for your Myoview test:  1. Do not eat or drink after midnight 2. No caffeine for 24 hours prior to test 3. No smoking 24 hours prior to test. 4. Your medication may be taken with water.  If your doctor stopped a medication because of this test, do not take that medication. 5. Ladies, please do not wear dresses.  Skirts or pants are appropriate. Please wear a short sleeve shirt. 6. No perfume, cologne or lotion. 7. Wear comfortable walking shoes. No heels!  Medication  Instructions:  No changes  If you need a refill on your cardiac medications before your next appointment, please call your pharmacy.    Lab work: No new labs needed   If you have labs (blood work) drawn today and your tests are completely normal, you will receive your results only by: Marland Kitchen MyChart Message (if you have MyChart) OR . A paper copy in the mail If you have any lab test that is abnormal or we need to change your treatment, we will call you to review the results.   Testing/Procedures: No new testing needed   Follow-Up: At [facility], you and your health needs are our priority.  As part of our continuing mission to provide you with exceptional heart care, we have created designated Provider Care Teams.  These Care Teams include your primary Cardiologist (physician) and Advanced Practice Providers (APPs -  Physician Assistants and Nurse Practitioners) who all work together to provide you with the care you need, when you need it.  . You will need a follow up appointment in 12 months .   Please call our office 2 months in advance to schedule this appointment.    . Providers on your designated Care Team:   . Murray Hodgkins, NP . Christell Faith, PA-C . Marrianne Mood, PA-C  Any Other Special Instructions Will Be Listed Below (If Applicable).  For educational health videos Log in to : [email or web address] Or : [email or web address], password : [email or web address]

## 2018-05-31 ENCOUNTER — Ambulatory Visit
Admission: RE | Admit: 2018-05-31 | Discharge: 2018-05-31 | Disposition: A | Discharge status: Home / Self Care | Payer: Medicare Other | Source: Ambulatory Visit | Attending: Cardiovascular Disease | Admitting: Cardiovascular Disease

## 2018-05-31 DIAGNOSIS — I429 Cardiomyopathy, unspecified: Secondary | ICD-10-CM | POA: Diagnosis not present

## 2018-05-31 LAB — NM MYOCAR MULTI W/SPECT W/WALL MOTION / EF
CHL CUP NUCLEAR SDS: 10
CHL RATE OF PERCEIVED EXERTION: 0
Estimated workload: 1 METS
Exercise duration (min): 0 min
Exercise duration (sec): 0 s
LVDIAVOL: 109 mL (ref 46–106)
LVSYSVOL: 40 mL
MPHR: 150 {beats}/min
NUC STRESS TID: 1.08
Peak HR: 114 {beats}/min
Percent HR: 76 %
Rest HR: 81 {beats}/min
SRS: 4
SSS: 12

## 2018-05-31 MED ORDER — TECHNETIUM TC 99M TETROFOSMIN IV KIT
30.1100 | PACK | Freq: Once | INTRAVENOUS | Status: AC | PRN
  Administered 2018-05-31: 30.11 via INTRAVENOUS

## 2018-05-31 MED ORDER — TECHNETIUM TC 99M TETROFOSMIN IV KIT
10.1600 | PACK | Freq: Once | INTRAVENOUS | Status: AC | PRN
  Administered 2018-05-31: 10.16 via INTRAVENOUS

## 2018-05-31 MED ORDER — REGADENOSON 0.4 MG/5ML IV SOLN
0.4000 mg | Freq: Once | INTRAVENOUS | Status: AC
  Administered 2018-05-31: 0.4 mg via INTRAVENOUS

## 2018-06-05 ENCOUNTER — Telehealth: Payer: Self-pay | Admitting: *Deleted

## 2018-06-05 NOTE — Telephone Encounter (Signed)
Reviewed results and recommendations in detail with patient and she verbalized understanding. She states that she is unable to take any kind of statins stating that she is unable to function with some cognitive changes as well. She verbalized understanding of our conversation, agreement with plan, and had no further questions at this time.

## 2018-06-05 NOTE — Telephone Encounter (Signed)
-----   Message from Minna Merritts, MD sent at 06/03/2018 10:57 AM EST ----- Stress test No significant ischemia, There was a perfusion defect noted,  Though unable to exclude breast attenuation artifact Would manage medically at this time,  Consider a statin for cholesterol

## 2018-06-29 DIAGNOSIS — L820 Inflamed seborrheic keratosis: Secondary | ICD-10-CM | POA: Diagnosis not present

## 2018-06-29 DIAGNOSIS — B078 Other viral warts: Secondary | ICD-10-CM | POA: Diagnosis not present

## 2018-06-29 DIAGNOSIS — L821 Other seborrheic keratosis: Secondary | ICD-10-CM | POA: Diagnosis not present

## 2018-07-03 ENCOUNTER — Other Ambulatory Visit: Payer: Self-pay | Admitting: Cardiovascular Disease

## 2018-07-05 ENCOUNTER — Ambulatory Visit: Payer: Medicare Other | Admitting: Gastroenterology

## 2018-07-06 ENCOUNTER — Ambulatory Visit (INDEPENDENT_AMBULATORY_CARE_PROVIDER_SITE_OTHER): Payer: Medicare Other | Admitting: Gastroenterology

## 2018-07-06 ENCOUNTER — Encounter: Payer: Self-pay | Admitting: Gastroenterology

## 2018-07-06 VITALS — BP 121/84 | HR 93 | Resp 17 | Ht 66.0 in | Wt 231.0 lb

## 2018-07-06 DIAGNOSIS — K219 Gastro-esophageal reflux disease without esophagitis: Secondary | ICD-10-CM

## 2018-07-06 DIAGNOSIS — R490 Dysphonia: Secondary | ICD-10-CM | POA: Diagnosis not present

## 2018-07-06 NOTE — Progress Notes (Signed)
Cephas Darby, MD [address]  [place]  [place], [place] [address]  Main: [phone]  Fax: [phone]    Gastroenterology Consultation  Referring Provider:     Glean Hess, MD Primary Care Physician:  Glean Hess, MD Primary Gastroenterologist:  Dr. Cephas Darby Reason for Consultation: Dysphagia, mucus drainage        HPI:   Tamanna Whitson is a 73 y.o. female referred by Dr. Army Melia, Jesse Sans, MD  for consultation & management of reflux.  Patient moved to [place] from [place] about an year ago. Since then, she reports exacerbation of asthma symptoms. She has been followed by Dr. Mortimer Fries for management of asthma.  Along with this, she has been experiencing burning sensation in her throat, throat discomfort as well as mucus drainage mixed with blood, sometimes difficulty swallowing.  She also has hoarseness of voice.  The symptoms are new for her.  She is taking loratadine once a day.   Patient has history of SVT, status post nodal ablation.  She is seeing Dr. Rockey Situ here  She has been receiving annual FIT testing in [place] when she was under [facility]  These have been negative to date.  Follow-up visit 04/05/2018 Patient underwent EGD which was unremarkable including biopsies negative for eosinophilic esophagitis. Patient has been taking loratadine for allergies and reports that her mucus drainage has significantly improved.  Follow-up visit 07/06/2018 Patient reports that her symptoms have significantly improved.  She is taking cimetidine 400 mg twice daily as well as loratadine.  She is also following a plant-based diet and lost about 10 pounds.  She denies any complaints today   NSAIDs: None  Antiplts/Anticoagulants/Anti thrombotics: None  GI Procedures: EGD 10/13/2011 IMPRESSIONS: Normal esophagus. Four biopsies taken. Mild gastritis was found in the antrum and body of the stomach  (535.50). Two biopsies taken. Otherwise,  the stomach appeared to be normal. Normal duodenum.  - Normal duodenal bulb and second portion of the duodenum. - Normal stomach. - Esophageal mucosal changes suspicious for eosinophilic esophagitis. Biopsied. - Esophagogastric landmarks identified. - Normal gastroesophageal junction.  EGD 03/21/18 Impression: - Normal duodenal bulb and second portion of the duodenum. - Normal stomach. - Esophageal mucosal changes suspicious for eosinophilic esophagitis. Biopsied. - Esophagogastric landmarks identified. - Normal gastroesophageal junction.  Colonoscopy 03/21/18 - Perianal skin tags found on perianal exam. - The examined portion of the ileum was normal. - Two 5 to 8 mm polyps in the cecum, removed with a cold snare. Resected and retrieved. Clip (MR conditional) was placed. - One 9 mm polyp in the descending colon, removed with mucosal resection. Resected and retrieved. Clip (MR conditional) was placed. - The distal rectum and anal verge are normal on retroflexion view.    Past Medical History:  Diagnosis Date  . Arthritis   . Asthma   . GERD (gastroesophageal reflux disease)   . Herniated disc, cervical   . Left wrist fracture   . Stroke ([place]) 1998  . SVT (supraventricular tachycardia) ([facility])     Past Surgical History:  Procedure Laterality Date  . BACK SURGERY  2017   L2-4 laminectomy and foraminal stenosis  . BREAST CYST ASPIRATION Left 1977  . CARDIAC CATHETERIZATION  2009   [facility] in [place]  . CARDIAC ELECTROPHYSIOLOGY STUDY AND ABLATION  2009  . CATARACT EXTRACTION, BILATERAL  2010  . COLONOSCOPY WITH PROPOFOL N/A 03/21/2018   Procedure: COLONOSCOPY WITH Biopsy;  Surgeon: Lin Landsman, MD;  Location: [place];  Service: Endoscopy;  Laterality: N/A;  . derrick procedure  1977-78   bilateral   . ESOPHAGOGASTRODUODENOSCOPY  2013   gastritis; done for complaint of dysphagia  . ESOPHAGOGASTRODUODENOSCOPY (EGD) WITH  PROPOFOL N/A 03/21/2018   Procedure: ESOPHAGOGASTRODUODENOSCOPY (EGD) WITH Biopsies;  Surgeon: Lin Landsman, MD;  Location: [place];  Service: Endoscopy;  Laterality: N/A;  KEEP THIS PATIENT FIRST  . lymph node removal     neck  . MOHS SURGERY  07/2016   BCCA nose  . POLYPECTOMY N/A 03/21/2018   Procedure: POLYPECTOMY;  Surgeon: Lin Landsman, MD;  Location: [place];  Service: Endoscopy;  Laterality: N/A;  . REPLACEMENT TOTAL KNEE  2013   RT  . TOTAL SHOULDER REPLACEMENT  2012   RT     Current Outpatient Medications:  .  baclofen (LIORESAL) 10 MG tablet, TAKE 1 TABLET BY MOUTH THREE TIMES A DAY (Patient taking differently: 2 (two) times daily. ), Disp: 270 tablet, Rfl: 1 .  calcium-vitamin D (OSCAL WITH D) 500-200 MG-UNIT tablet, Take 1 tablet by mouth., Disp: , Rfl:  .  cholecalciferol (VITAMIN D) 400 units TABS tablet, Take 400 Units by mouth., Disp: , Rfl:  .  Cranberry 1000 MG CAPS, Take by mouth., Disp: , Rfl:  .  diltiazem (CARDIZEM CD) 180 MG 24 hr capsule, TAKE 1 CAPSULE (180 MG TOTAL) DAILY BY MOUTH., Disp: 90 capsule, Rfl: 4 .  diltiazem (CARDIZEM) 60 MG tablet, Take 60 mg by mouth 4 (four) times daily., Disp: , Rfl:  .  FLOVENT HFA 110 MCG/ACT inhaler, TAKE 2 PUFFS BY MOUTH TWICE A DAY, Disp: 36 Inhaler, Rfl: 1 .  gabapentin (NEURONTIN) 300 MG capsule, Take 1 capsule (300 mg total) by mouth 3 (three) times daily. Three capsules daily at bedtime., Disp: 270 capsule, Rfl: 3 .  Multiple Vitamins-Minerals (MULTIVITAMIN WITH MINERALS) tablet, Take 1 tablet by mouth daily., Disp: , Rfl:  .  NON FORMULARY, Protleotic Enzymes, Disp: , Rfl:  .  Probiotic Product (PROBIOTIC DAILY PO), Take by mouth., Disp: , Rfl:  .  STRIVERDI RESPIMAT 2.5 MCG/ACT AERS, INHALE 2 PUFFS INTO THE LUNGS DAILY., Disp: 12 g, Rfl: 2 .  Turmeric 500 MG CAPS, Take by mouth., Disp: , Rfl:  .  vitamin E 100 UNIT capsule, Take by mouth daily., Disp: , Rfl:  .  Ginger 500 MG  CAPS, Take by mouth., Disp: , Rfl:  .  metoprolol tartrate (LOPRESSOR) 100 MG tablet, Take 1 tablet (100 mg total) by mouth once for 1 dose. Please take 1 tablet (100 mg total) once on the morning of Cardiac CT, 2 hours prior to study., Disp: 1 tablet, Rfl: 0 .  Misc Natural Products (TART CHERRY ADVANCED PO), Take by mouth., Disp: , Rfl:     Family History  Problem Relation Age of Onset  . Diabetes Mother   . Stroke Mother   . Valvular heart disease Father   . Breast cancer Neg Hx      Social History   Tobacco Use  . Smoking status: Never Smoker  . Smokeless tobacco: Never Used  Substance Use Topics  . Alcohol use: Yes    Comment: may drink 1-2x/yr  . Drug use: No    Allergies as of 07/06/2018 - Review Complete 07/06/2018  Allergen Reaction Noted  . Aspirin Anaphylaxis 03/21/2017  . Ciprofloxacin Rash 08/28/2017  . Ciprofloxacin-dexamethasone Anaphylaxis 03/21/2017  . Crestor [rosuvastatin] Other (See Comments) 03/21/2017  . Morphine and  related Nausea Only 07/04/2017  . Amoxicillin Hives 03/21/2017  . Doxycycline hyclate Hives 03/21/2017  . Hydrochlorothiazide Other (See Comments) 03/21/2017  . Keflex [cephalexin] Hives 03/21/2017  . Lovastatin Other (See Comments) 03/21/2017  . Simvastatin Other (See Comments) 03/21/2017  . Erythromycin Hives 03/21/2017  . Ibuprofen Other (See Comments) 03/21/2017  . Neomycin-bacitracin zn-polymyx Hives and Rash 03/21/2017  . Omeprazole Palpitations 03/21/2017  . Tape Hives and Rash 03/21/2017    Review of Systems:    All systems reviewed and negative except where noted in HPI.   Physical Exam:  BP 121/84 (BP Location: Left Arm, Patient Position: Sitting, Cuff Size: Large)   Pulse 93   Resp 17   Ht 5\' 6"  (1.676 m)   Wt 231 lb (104.8 kg)   BMI 37.28 kg/m  No LMP recorded. Patient is postmenopausal.  General:   Alert,  Well-developed, well-nourished, pleasant and cooperative in NAD Head:  Normocephalic and  atraumatic. Eyes:  Sclera clear, no icterus.   Conjunctiva pink. Ears:  Normal auditory acuity. Nose:  No deformity, discharge, or lesions. Mouth:  No deformity or lesions,oropharynx pink & moist. Neck:  Supple; no masses or thyromegaly. Lungs:  Respirations even and unlabored.  Clear throughout to auscultation.   No wheezes, crackles, or rhonchi. No acute distress. Heart:  Regular rate and rhythm; no murmurs, clicks, rubs, or gallops. Abdomen:  Normal bowel sounds. Soft, non-tender and non-distended without masses, hepatosplenomegaly or hernias noted.  No guarding or rebound tenderness.   Rectal: Not performed Msk:  Symmetrical without gross deformities. Good, equal movement & strength bilaterally. Pulses:  Normal pulses noted. Extremities:  No clubbing or edema.  No cyanosis. Neurologic:  Alert and oriented x3;  grossly normal neurologically. Skin:  Intact without significant lesions or rashes. No jaundice. Psych:  Alert and cooperative. Normal mood and affect.  Imaging Studies: Reviewed  Assessment and Plan:   Jasmine Mcbeth is a 73 y.o. Caucasian female with obesity, seen in consultation for 1 year history of soreness in the throat, hoarseness of voice, dysphagia, bloody mucus discharge.  Part of the symptoms have improved on allergy medication.  She continues to have hoarseness of voice.  EGD unremarkable, no evidence of esophagitis.  Patient experienced SVTs on omeprazole in the past.  Will not try PPI at this time.    -Continue cimetidine 400mg  BID  Tubular adenomas of colon Recommend surveillance colonoscopy in 02/2021   Follow up as needed   Cephas Darby, MD

## 2018-07-19 ENCOUNTER — Encounter: Payer: Self-pay | Admitting: Internal Medicine

## 2018-07-19 ENCOUNTER — Ambulatory Visit (INDEPENDENT_AMBULATORY_CARE_PROVIDER_SITE_OTHER): Payer: Medicare Other | Admitting: Internal Medicine

## 2018-07-19 VITALS — BP 132/82 | HR 106 | Ht 66.0 in | Wt 230.8 lb

## 2018-07-19 DIAGNOSIS — R490 Dysphonia: Secondary | ICD-10-CM | POA: Diagnosis not present

## 2018-07-19 DIAGNOSIS — J4520 Mild intermittent asthma, uncomplicated: Secondary | ICD-10-CM | POA: Diagnosis not present

## 2018-07-19 NOTE — Patient Instructions (Signed)
Recommend ENT referral for Hoarseness of Voice  Continue inhalers as prescribed

## 2018-07-19 NOTE — Progress Notes (Signed)
Name: Audrey Martinez MRN: [id number] DOB: 08/12/1945     CONSULTATION DATE:07/19/2018  REFERRING MD :  Dr Rockey Situ  CHIEF COMPLAINT:SOB, blood and mucus in back of throat  STUDIES:  NO CXR at this time  PREVIOUS ASTHMA HISTORY chronic cough daily for the past several years Aggravating factors -Cold air and moisture,Strong perfumes, smoke   chronic nasal congestion she has tried Zyrtec and Claritin and did not work she has also tried Designer, industrial/product and did not work  Patient was started on [employer] and Serevent and this has helped her symptoms in the past but some reason she was switched over to Advair and the Advair has not helped her at all  Patient does not take any aspirin due to the fact that she states that she has an aspirin sensitivity that triggers her asthma  HISTORY OF PRESENT ILLNESS:  Follow-up assessment for asthma No exacerbation at this time Doing well on STRIVERDI inhaler No signs of infection at this time   No fevers no chills No respiratory distress  +voice changes +hoarseness  No wheezing +nasal congestion   Review of Systems:  Gen:  Denies  fever, sweats, chills weigh loss  HEENT: Denies blurred vision, double vision, ear pain, eye pain, hearing loss, nose bleeds, sore throat +hoarseness Cardiac:  No dizziness, chest pain or heaviness, chest tightness,edema, No JVD Resp:   No cough, -sputum production, -shortness of breath,-wheezing, -hemoptysis,  Gi: Denies swallowing difficulty, stomach pain, nausea or vomiting, diarrhea, constipation, bowel incontinence Gu:  Denies bladder incontinence, burning urine Ext:   Denies Joint pain, stiffness or swelling Skin: Denies  skin rash, easy bruising or bleeding or hives Endoc:  Denies polyuria, polydipsia , polyphagia or weight change Psych:   Denies depression, insomnia or hallucinations  Other:  All other systems negative    Ht 5\' 6"  (1.676 m)   Wt 230 lb 12.8 oz (104.7 kg)   BMI 37.25 kg/m  BP  132/82 (BP Location: Left Arm, Cuff Size: Normal)   Pulse (!) 106   Ht 5\' 6"  (1.676 m)   Wt 230 lb 12.8 oz (104.7 kg)   SpO2 95%   BMI 37.25 kg/m    Physical Examination:   GENERAL:NAD, no fevers, chills, no weakness no fatigue HEAD: Normocephalic, atraumatic.  EYES: PERLA, EOMI No scleral icterus.  MOUTH: Moist mucosal membrane.  EAR, NOSE, THROAT: Clear without exudates. No external lesions.  NECK: Supple. No thyromegaly.  No JVD.  PULMONARY: CTA B/L no wheezing, rhonchi, crackles CARDIOVASCULAR: S1 and S2. Regular rate and rhythm. No murmurs GASTROINTESTINAL: Soft, nontender, nondistended. Positive bowel sounds.  MUSCULOSKELETAL: No swelling, clubbing, or edema.  NEUROLOGIC: No gross focal neurological deficits. 5/5 strength all extremities SKIN: No ulceration, lesions, rashes, or cyanosis.  PSYCHIATRIC: Insight, judgment intact. -depression -anxiety ALL OTHER ROS ARE NEGATIVE       Active Ambulatory Problems    Diagnosis Date Noted  . Osteoporosis 03/21/2017  . Personal history of transient ischemic attack (TIA), and cerebral infarction without residual deficits 04/22/2003  . Hyperlipidemia 05/12/2003  . Obesity 09/14/2011  . Benign essential tremor 03/21/2017  . Atrial tachycardia ([place]) 04/14/2017  . SVT (supraventricular tachycardia) ([place]) 04/14/2017  . Essential hypertension 04/14/2017  . Reactive airway disease 04/14/2017  . Lumbar radiculopathy 05/18/2017  . Status post hemilaminotomy 05/18/2017  . Lumbar degenerative disc disease 05/18/2017  . Spinal stenosis of lumbar region without neurogenic claudication 05/18/2017  . Tendinitis of right knee 07/03/2017  . Arm pain, right 11/08/2017  .  Renal insufficiency 11/08/2017  . Colon cancer screening    Resolved Ambulatory Problems    Diagnosis Date Noted  . No Resolved Ambulatory Problems   Past Medical History:  Diagnosis Date  . Arthritis   . Asthma   . GERD (gastroesophageal reflux disease)   .  Herniated disc, cervical   . Left wrist fracture   . Stroke [facility]) [id number]     35-year-old pleasant white female seen today for follow-up mild intermittent asthma with allergic rhinitis with reflux disease with a history of SVT  Asthma Well controlled Mild intermittent Continue inhaler Striverdi and continue Flovent Continue albuterol as needed   Allergic rhinitis Continue antihistamine as prescribed  GERD Continue PPI Follow-up GI   Obesity -recommend significant weight loss -recommend changing diet  Deconditioned state -Recommend increased daily activity and exercise  Hoarseness and Voice changes Recommend ENT referral   Patient satisfied with Plan of action and management. All questions answered Follow up in 6 months  Skai Lickteig Patricia Pesa, M.D.  [facility]  Medical Director [place] Director [facility] Cardio-Pulmonary Department

## 2018-07-19 NOTE — Addendum Note (Signed)
Addended by: Darreld Mclean on: 07/19/2018 11:42 AM   Modules accepted: Orders

## 2018-07-20 ENCOUNTER — Ambulatory Visit: Payer: Medicare Other | Admitting: Internal Medicine

## 2018-07-24 ENCOUNTER — Other Ambulatory Visit: Payer: Self-pay | Admitting: Internal Medicine

## 2018-07-31 DIAGNOSIS — B078 Other viral warts: Secondary | ICD-10-CM | POA: Diagnosis not present

## 2018-08-07 DIAGNOSIS — J301 Allergic rhinitis due to pollen: Secondary | ICD-10-CM | POA: Diagnosis not present

## 2018-08-07 DIAGNOSIS — R490 Dysphonia: Secondary | ICD-10-CM | POA: Diagnosis not present

## 2018-08-07 DIAGNOSIS — K219 Gastro-esophageal reflux disease without esophagitis: Secondary | ICD-10-CM | POA: Diagnosis not present

## 2018-08-22 ENCOUNTER — Emergency Department: Payer: Medicare Other

## 2018-08-22 ENCOUNTER — Other Ambulatory Visit: Payer: Self-pay

## 2018-08-22 ENCOUNTER — Emergency Department
Admission: EM | Admit: 2018-08-22 | Discharge: 2018-08-22 | Disposition: A | Discharge status: Home / Self Care | Payer: Medicare Other | Attending: Student in an Organized Health Care Education/Training Program | Admitting: Student in an Organized Health Care Education/Training Program

## 2018-08-22 ENCOUNTER — Encounter: Payer: Self-pay | Admitting: Emergency Medicine

## 2018-08-22 DIAGNOSIS — R05 Cough: Secondary | ICD-10-CM | POA: Diagnosis not present

## 2018-08-22 DIAGNOSIS — J40 Bronchitis, not specified as acute or chronic: Secondary | ICD-10-CM | POA: Diagnosis not present

## 2018-08-22 DIAGNOSIS — Z96651 Presence of right artificial knee joint: Secondary | ICD-10-CM | POA: Diagnosis not present

## 2018-08-22 DIAGNOSIS — J45909 Unspecified asthma, uncomplicated: Secondary | ICD-10-CM | POA: Diagnosis not present

## 2018-08-22 DIAGNOSIS — I10 Essential (primary) hypertension: Secondary | ICD-10-CM | POA: Diagnosis not present

## 2018-08-22 DIAGNOSIS — R0602 Shortness of breath: Secondary | ICD-10-CM | POA: Diagnosis not present

## 2018-08-22 DIAGNOSIS — R059 Cough, unspecified: Secondary | ICD-10-CM

## 2018-08-22 DIAGNOSIS — Z79899 Other long term (current) drug therapy: Secondary | ICD-10-CM | POA: Diagnosis not present

## 2018-08-22 DIAGNOSIS — Z96611 Presence of right artificial shoulder joint: Secondary | ICD-10-CM | POA: Diagnosis not present

## 2018-08-22 LAB — LACTIC ACID, PLASMA: Lactic Acid, Venous: 1.4 mmol/L (ref 0.5–1.9)

## 2018-08-22 LAB — CBC WITH DIFFERENTIAL/PLATELET
ABS IMMATURE GRANULOCYTES: 0.02 10*3/uL (ref 0.00–0.07)
BASOS PCT: 0 %
Basophils Absolute: 0 10*3/uL (ref 0.0–0.1)
Eosinophils Absolute: 0 10*3/uL (ref 0.0–0.5)
Eosinophils Relative: 1 %
HCT: 46.9 % — ABNORMAL HIGH (ref 36.0–46.0)
Hemoglobin: 15.8 g/dL — ABNORMAL HIGH (ref 12.0–15.0)
Immature Granulocytes: 0 %
Lymphocytes Relative: 15 %
Lymphs Abs: 1.1 10*3/uL (ref 0.7–4.0)
MCH: 31.3 pg (ref 26.0–34.0)
MCHC: 33.7 g/dL (ref 30.0–36.0)
MCV: 92.9 fL (ref 80.0–100.0)
Monocytes Absolute: 0.7 10*3/uL (ref 0.1–1.0)
Monocytes Relative: 10 %
Neutro Abs: 5.5 10*3/uL (ref 1.7–7.7)
Neutrophils Relative %: 74 %
Platelets: 197 10*3/uL (ref 150–400)
RBC: 5.05 MIL/uL (ref 3.87–5.11)
RDW: 11.9 % (ref 11.5–15.5)
WBC: 7.3 10*3/uL (ref 4.0–10.5)
nRBC: 0 % (ref 0.0–0.2)

## 2018-08-22 LAB — COMPREHENSIVE METABOLIC PANEL
ALT: 17 U/L (ref 0–44)
AST: 23 U/L (ref 15–41)
Albumin: 4.4 g/dL (ref 3.5–5.0)
Alkaline Phosphatase: 63 U/L (ref 38–126)
Anion gap: 12 (ref 5–15)
BILIRUBIN TOTAL: 1.5 mg/dL — AB (ref 0.3–1.2)
BUN: 13 mg/dL (ref 8–23)
CO2: 22 mmol/L (ref 22–32)
Calcium: 9.3 mg/dL (ref 8.9–10.3)
Chloride: 103 mmol/L (ref 98–111)
Creatinine, Ser: 1.01 mg/dL — ABNORMAL HIGH (ref 0.44–1.00)
GFR calc Af Amer: >60 mL/min (ref >60)
GFR calc non Af Amer: 56 mL/min — ABNORMAL LOW (ref >60)
Glucose, Bld: 124 mg/dL — ABNORMAL HIGH (ref 70–99)
Potassium: 3.9 mmol/L (ref 3.5–5.1)
Sodium: 137 mmol/L (ref 135–145)
Total Protein: 7 g/dL (ref 6.5–8.1)

## 2018-08-22 LAB — INFLUENZA PANEL BY PCR (TYPE A & B)
Influenza A By PCR: NEGATIVE
Influenza B By PCR: NEGATIVE

## 2018-08-22 MED ORDER — PREDNISONE 20 MG PO TABS
40.0000 mg | ORAL_TABLET | Freq: Once | ORAL | Status: AC
  Administered 2018-08-22: 40 mg via ORAL
  Filled 2018-08-22: qty 2

## 2018-08-22 MED ORDER — SODIUM CHLORIDE 0.9 % IV BOLUS: 500.0000 mL | Freq: Once | INTRAVENOUS | Status: DC

## 2018-08-22 MED ORDER — PREDNISONE 20 MG PO TABS: 40.0000 mg | ORAL_TABLET | Freq: Every day | ORAL | 0 refills | Status: AC

## 2018-08-22 MED ORDER — ALBUTEROL SULFATE HFA 108 (90 BASE) MCG/ACT IN AERS
2.0000 | INHALATION_SPRAY | Freq: Once | RESPIRATORY_TRACT | Status: AC
  Administered 2018-08-22: 2 via RESPIRATORY_TRACT
  Filled 2018-08-22: qty 6.7

## 2018-08-22 NOTE — ED Triage Notes (Signed)
Pt has had runny nose, cough, and fever X 3 days.  Ambulatory. Unlabored.  Have not been around anyone sick.  Pt feels like may asthma or allergies.

## 2018-08-22 NOTE — ED Provider Notes (Signed)
[facility] Emergency Department Provider Note    First MD Initiated Contact with Patient 08/22/18 1855     (approximate)  I have reviewed the triage vital signs and the nursing notes.   HISTORY  Chief Complaint Cough    HPI Audrey Martinez is a 73 y.o. female below listed past medical history presents the ER for cough and low-grade temperature past several days.  Also feeling more short of breath.  States that she has controller inhalers but does not have a rescue inhaler.  She has not been on any antibiotics.  States that she has been in self-isolation for over 7 days due to fear of coronavirus.  No recent travel.  No sick contacts.  States that she does develop bronchitis seasonally and typically gets worse around this time a year due to allergies.    Past Medical History:  Diagnosis Date  . Arthritis   . Asthma   . GERD (gastroesophageal reflux disease)   . Herniated disc, cervical   . Left wrist fracture   . Stroke ([place]) 1998  . SVT (supraventricular tachycardia) (HCC)    Family History  Problem Relation Age of Onset  . Diabetes Mother   . Stroke Mother   . Valvular heart disease Father   . Breast cancer Neg Hx    Past Surgical History:  Procedure Laterality Date  . BACK SURGERY  2017   L2-4 laminectomy and foraminal stenosis  . BREAST CYST ASPIRATION Left 1977  . CARDIAC CATHETERIZATION  2009   [facility] in [place]  . CARDIAC ELECTROPHYSIOLOGY STUDY AND ABLATION  2009  . CATARACT EXTRACTION, BILATERAL  2010  . COLONOSCOPY WITH PROPOFOL N/A 03/21/2018   Procedure: COLONOSCOPY WITH Biopsy;  Surgeon: Lin Landsman, MD;  Location: [place];  Service: Endoscopy;  Laterality: N/A;  . derrick procedure  1977-78   bilateral   . ESOPHAGOGASTRODUODENOSCOPY  2013   gastritis; done for complaint of dysphagia  . ESOPHAGOGASTRODUODENOSCOPY (EGD) WITH PROPOFOL N/A 03/21/2018   Procedure:  ESOPHAGOGASTRODUODENOSCOPY (EGD) WITH Biopsies;  Surgeon: Lin Landsman, MD;  Location: [place];  Service: Endoscopy;  Laterality: N/A;  KEEP THIS PATIENT FIRST  . lymph node removal     neck  . MOHS SURGERY  07/2016   BCCA nose  . POLYPECTOMY N/A 03/21/2018   Procedure: POLYPECTOMY;  Surgeon: Lin Landsman, MD;  Location: [place];  Service: Endoscopy;  Laterality: N/A;  . REPLACEMENT TOTAL KNEE  2013   RT  . TOTAL SHOULDER REPLACEMENT  2012   RT    Patient Active Problem List   Diagnosis Date Noted  . Colon cancer screening   . Arm pain, right 11/08/2017  . Renal insufficiency 11/08/2017  . Tendinitis of right knee 07/03/2017  . Lumbar radiculopathy 05/18/2017  . Status post hemilaminotomy 05/18/2017  . Lumbar degenerative disc disease 05/18/2017  . Spinal stenosis of lumbar region without neurogenic claudication 05/18/2017  . Atrial tachycardia ([place]) 04/14/2017  . SVT (supraventricular tachycardia) ([place]) 04/14/2017  . Essential hypertension 04/14/2017  . Reactive airway disease 04/14/2017  . Osteoporosis 03/21/2017  . Benign essential tremor 03/21/2017  . Obesity 09/14/2011  . Hyperlipidemia 05/12/2003  . Personal history of transient ischemic attack (TIA), and cerebral infarction without residual deficits 04/22/2003      Prior to Admission medications   Medication Sig Start Date End Date Taking? Authorizing Provider  baclofen (LIORESAL) 10 MG tablet TAKE 1 TABLET BY MOUTH THREE TIMES A  DAY Patient taking differently: 2 (two) times daily.  10/27/17   Glean Hess, MD  calcium-vitamin D (OSCAL WITH D) 500-200 MG-UNIT tablet Take 1 tablet by mouth.    [provider]  cholecalciferol (VITAMIN D) 400 units TABS tablet Take 400 Units by mouth.    [provider]  Cranberry 1000 MG CAPS Take by mouth.    [provider]  diltiazem (CARDIZEM CD) 180 MG 24 hr capsule TAKE 1 CAPSULE (180 MG TOTAL) DAILY BY MOUTH.  07/03/18   Gollan, Kathlene November, MD  diltiazem (CARDIZEM) 60 MG tablet Take 60 mg by mouth 4 (four) times daily.    [provider]  FLOVENT HFA 110 MCG/ACT inhaler TAKE 2 PUFFS BY MOUTH TWICE A DAY 07/24/18   Laverle Hobby, MD  gabapentin (NEURONTIN) 300 MG capsule Take 1 capsule (300 mg total) by mouth 3 (three) times daily. Three capsules daily at bedtime. 11/08/17   Glean Hess, MD  Ginger 500 MG CAPS Take by mouth.    [provider]  Misc Natural Products (TART CHERRY ADVANCED PO) Take by mouth.    [provider]  Multiple Vitamins-Minerals (MULTIVITAMIN WITH MINERALS) tablet Take 1 tablet by mouth daily.    [provider]  NON FORMULARY Protleotic Enzymes    [provider]  predniSONE (DELTASONE) 20 MG tablet Take 2 tablets (40 mg total) by mouth daily for 4 days. 08/22/18 08/26/18  Merlyn Lot, MD  Probiotic Product (PROBIOTIC DAILY PO) Take by mouth.    [provider]  STRIVERDI RESPIMAT 2.5 MCG/ACT AERS INHALE 2 PUFFS INTO THE LUNGS DAILY. 01/30/18   Laverle Hobby, MD  Turmeric 500 MG CAPS Take by mouth.    [provider]  vitamin E 100 UNIT capsule Take by mouth daily.    [provider]    Allergies Aspirin; Ciprofloxacin; Ciprofloxacin-dexamethasone; Crestor [rosuvastatin]; Morphine and related; Amoxicillin; Doxycycline hyclate; Hydrochlorothiazide; Keflex [cephalexin]; Lovastatin; Simvastatin; Erythromycin; Ibuprofen; Neomycin-bacitracin zn-polymyx; Omeprazole; and Tape    Social History Social History   Tobacco Use  . Smoking status: Never Smoker  . Smokeless tobacco: Never Used  Substance Use Topics  . Alcohol use: Yes    Comment: may drink 1-2x/yr  . Drug use: No    Review of Systems Patient denies headaches, rhinorrhea, blurry vision, numbness, shortness of breath, chest pain, edema, cough, abdominal pain, nausea, vomiting, diarrhea, dysuria, fevers, rashes or  hallucinations unless otherwise stated above in HPI. ____________________________________________   PHYSICAL EXAM:  VITAL SIGNS: Vitals:   08/22/18 1832 08/22/18 2000  BP:  (!) 107/92  Pulse:  98  Resp:    Temp: 100.2 F (37.9 C)   SpO2:  93%    Constitutional: Alert and oriented.  Eyes: Conjunctivae are normal.  Head: Atraumatic. Nose: No congestion/rhinnorhea. Mouth/Throat: Mucous membranes are moist.   Neck: No stridor. Painless ROM.  Cardiovascular: Normal rate, regular rhythm. Grossly normal heart sounds.  Good peripheral circulation. Respiratory: Normal respiratory effort.  No retractions. Lungs with coarse breathsounds throughout Gastrointestinal: Soft and nontender. No distention. No abdominal bruits. No CVA tenderness. Genitourinary:  Musculoskeletal: No lower extremity tenderness nor edema.  No joint effusions. Neurologic:  Normal speech and language. No gross focal neurologic deficits are appreciated. No facial droop Skin:  Skin is warm, dry and intact. No rash noted. Psychiatric: Mood and affect are normal. Speech and behavior are normal.  ____________________________________________   LABS (all labs ordered are listed, but only abnormal results are displayed)  Results for  orders placed or performed during the hospital encounter of 08/22/18 (from the past 24 hour(s))  Lactic acid, plasma     Status: None   Collection Time: 08/22/18  7:27 PM  Result Value Ref Range   Lactic Acid, Venous 1.4 0.5 - 1.9 mmol/L  Comprehensive metabolic panel     Status: Abnormal   Collection Time: 08/22/18  7:27 PM  Result Value Ref Range   Sodium 137 135 - 145 mmol/L   Potassium 3.9 3.5 - 5.1 mmol/L   Chloride 103 98 - 111 mmol/L   CO2 22 22 - 32 mmol/L   Glucose, Bld 124 (H) 70 - 99 mg/dL   BUN 13 8 - 23 mg/dL   Creatinine, Ser 1.01 (H) 0.44 - 1.00 mg/dL   Calcium 9.3 8.9 - 10.3 mg/dL   Total Protein 7.0 6.5 - 8.1 g/dL   Albumin 4.4 3.5 - 5.0 g/dL   AST 23 15 - 41 U/L    ALT 17 0 - 44 U/L   Alkaline Phosphatase 63 38 - 126 U/L   Total Bilirubin 1.5 (H) 0.3 - 1.2 mg/dL   GFR calc non Af Amer 56 (L) >60 mL/min   GFR calc Af Amer >60 >60 mL/min   Anion gap 12 5 - 15  CBC WITH DIFFERENTIAL     Status: Abnormal   Collection Time: 08/22/18  7:27 PM  Result Value Ref Range   WBC 7.3 4.0 - 10.5 K/uL   RBC 5.05 3.87 - 5.11 MIL/uL   Hemoglobin 15.8 (H) 12.0 - 15.0 g/dL   HCT 46.9 (H) 36.0 - 46.0 %   MCV 92.9 80.0 - 100.0 fL   MCH 31.3 26.0 - 34.0 pg   MCHC 33.7 30.0 - 36.0 g/dL   RDW 11.9 11.5 - 15.5 %   Platelets 197 150 - 400 K/uL   nRBC 0.0 0.0 - 0.2 %   Neutrophils Relative % 74 %   Neutro Abs 5.5 1.7 - 7.7 K/uL   Lymphocytes Relative 15 %   Lymphs Abs 1.1 0.7 - 4.0 K/uL   Monocytes Relative 10 %   Monocytes Absolute 0.7 0.1 - 1.0 K/uL   Eosinophils Relative 1 %   Eosinophils Absolute 0.0 0.0 - 0.5 K/uL   Basophils Relative 0 %   Basophils Absolute 0.0 0.0 - 0.1 K/uL   Immature Granulocytes 0 %   Abs Immature Granulocytes 0.02 0.00 - 0.07 K/uL  Influenza panel by PCR (type A & B)     Status: None   Collection Time: 08/22/18  7:27 PM  Result Value Ref Range   Influenza A By PCR NEGATIVE NEGATIVE   Influenza B By PCR NEGATIVE NEGATIVE   ____________________________________________  EKG My review and personal interpretation at Time: 18:42   Indication: cough  Rate: 115  Rhythm: sinus Axis: normal Other: normal intervals, no stemi ____________________________________________  RADIOLOGY  I personally reviewed all radiographic images ordered to evaluate for the above acute complaints and reviewed radiology reports and findings.  These findings were personally discussed with the patient.  Please see medical record for radiology report.  ____________________________________________   PROCEDURES  Procedure(s) performed:  Procedures    Critical Care performed: no ____________________________________________   INITIAL IMPRESSION /  ASSESSMENT AND PLAN / ED COURSE  Pertinent labs & imaging results that were available during my care of the patient were reviewed by me and considered in my medical decision making (see chart for details).   DDX: Pneumonia, bronchitis, COPD, congestive heart  failure, dehydration, sepsis  Audrey Martinez is a 73 y.o. who presents to the ED with symptoms as described above.  Patient is nontoxic and in good spirits.  She is got a low-grade temperature mild tachycardia but otherwise well perfused.  Exam is concerning for bronchitis will give bronchodilators.  Blood work will be ordered she does have low-grade temperature to evaluate for evidence of sepsis or infectious process.  Clinical Course as of Aug 21 2121  Wed Aug 22, 2018  2117 Patient reassessed and feels improvement after nebulizer treatment.  Overall feels well.  Lactate is normal is no leukocytosis or lymphopenia.  X-ray without any consolidation.  Patient does have a history of bronchitis as well as seasonal allergies and had improvement with albuterol.  This point I do feel that she is low risk particular for COVID-19.  Furthermore she states that she has been in self-isolation without any human contacts for over 7days.no indication for further testing based on [place] algorithm at this time.  No indication for antibiotics.  We discussed importance of returning to the ER should she develop any worsening shortness of breath symptoms or concerns.   [PR]    Clinical Course User Index [PR] Merlyn Lot, MD     As part of my medical decision making, I reviewed the following data within the [place] notes reviewed and incorporated, Labs reviewed, notes from prior ED visits.   ____________________________________________   FINAL CLINICAL IMPRESSION(S) / ED DIAGNOSES  Final diagnoses:  Cough  Bronchitis      NEW MEDICATIONS STARTED DURING THIS VISIT:  New Prescriptions   PREDNISONE  (DELTASONE) 20 MG TABLET    Take 2 tablets (40 mg total) by mouth daily for 4 days.     Note:  This document was prepared using Dragon voice recognition software and may include unintentional dictation errors.    Merlyn Lot, MD 08/22/18 2123

## 2018-08-22 NOTE — ED Triage Notes (Signed)
FIRST NURSE NOTE-pt reports temp 99.6 and cough X 3 days.  Unlabored.

## 2018-08-22 NOTE — Discharge Instructions (Signed)
You have a viral illness which can have symptoms like muscle aches, fevers, chills, runny nose, cough, sneezing, sore throat, vomiting or diarrhea. One of the viruses that can cause this is SARS- CoV-2, the virus that causes COVID-19, also known as the novel coronavirus. You could also have a different viral infection such as the common cold or flu. Most patients with viral illness including COVID-19 have mild symptoms and recover on their own. Resting, staying hydrated, and sleeping are helpful. Today we think you are well enough to go home and treat your symptoms with oral liquids, and medicine for fevers, cough, and pain. ° °We generally do not do COVID-19 testing on people with mild symptoms who are being discharged from the Emergency Department or Clinic.  ° °If we did a test for COVID-19 the results will not be available for several days. We will call you with the result. Please DO NOT CONTACT THE EMERGENCY DEPARTMENT OR CLINIC FOR RESULTS OF THIS TEST.  ° °Please follow the precautions below:  °Stay home for at least 7 days after your symptoms began OR for 3 days after your fever ends, whichever takes longer. ? ° °If people live with you they should also stay home and avoid contact with others for 14 days.? ° ° °ISOLATION GUIDANCE FOR POSSIBLE COVID °Most people with cough and fever have an illness caused by a virus. One is COVID-19. Not all people with these infections are being tested for the virus that causes COVID-19. People who might have COVID but are not being tested should still try to prevent the spread of the infection. ° °These instructions are modified recommendations from the [place] Department of Health and Human Services. ° °People who might have COVID-19 should follow the instructions below until a doctor or health department says they can stop. ° °Stay home unless you need to see a doctor °Stop doing things outside your home except for getting medical care. Do not go to work, school,  or public areas; and do not use public transportation or taxis. ° °Call ahead before visiting the doctor °Before your appointment, call the doctor's office and tell them about your symptoms. This will help them take steps to keep other people from getting infected.  ° °Keep track of your symptoms °Symptoms are the things you feel, like fever or trouble breathing. Go to your doctor or the ER if you think you are getting worse, like having more trouble breathing. Call the doctor's office and tell them about your symptoms. This will help them take steps to keep other people from getting sick.  ° °Wear a face mask °You should wear a face mask that covers your nose and mouth when you are in the same room with other people and when you visit the doctor's office. People who live with you or visit you should also wear a face mask when they are in the same room with you. ° °Separate yourself from other people in your home °You should stay in a different room from other people in your home. You should stay separate from your family members as much as possible. You should use a separate bathroom if you can. ° °Avoid sharing things in your house °Don't share dishes, drinking glasses, cups, eating utensils, towels, bedding, or other things with people in your home. After using these things please wash them really well with soap and water. ° °Cover your coughs and sneezes °Cover your mouth and nose with a tissue when you   cough or sneeze, or you can cough or sneeze into your sleeve. Throw used tissues in a trash can that has a bag in it, and immediately wash your hands with soap and water for at least 20 seconds. If you use an alcohol-based hand rub please rub your hands together for 20 seconds. ° °Wash your hands °Wash your hands often and very well with soap and water for at least 20 seconds. If your hands are not visibly dirty you can use an alcohol-based hand sanitizer. Don't touch your eyes, nose, or mouth with unwashed  hands. ° ° ° °Instructions for People Helping Care for Patients with Possible COVID-19 °Follow your doctor's instructions °Make sure that you understand and can help the patient follow any instructions for care. ° °Provide for the patient's basic needs °You should help the patient with basic needs in the home and provide support for getting groceries, prescriptions, and other personal needs. ° °Keep track of the patient's symptoms °If they are getting sicker, call his or her doctor. This will help the doctor's office take steps to keep other people from getting infected. ° °Limit the number of people who have contact with the patient °If possible, have only one caregiver for the patient. °Other family members should stay in another home or place of residence. If they can't, they should stay in another room and stay separated from the patient as much as possible. °Keep one bathroom JUST for the patient if you can.  °Only allow visitors if they MUST be in the home. ° °Keep older adults, very young children, and other sick people away from the patient °Keep older adults, very young children, and people who have compromised immune systems or chronic health conditions away from the patient. This includes people with chronic heart, lung, or kidney conditions, diabetes, and cancer. ° °Wash your hands often °Avoid touching your eyes, nose, and mouth with unwashed hands. °Wash your hands often and thoroughly with soap and water for at least 20 seconds. You can use an alcohol-based hand sanitizer if soap and water are not available and if your hands are not visibly dirty.  °Use disposable paper towels to dry your hands. If not available, use dedicated cloth towels and replace them when they become wet. ° °Avoid contamination from face masks and gloves °Wear a disposable face mask and gloves whenever you are touching the patient, things in their room or bathroom, or things that can have their body fluid on them, like bedding  or dishes, or blood, vomit, urine, or feces (poop).  °Ensure the mask fits over your nose and mouth tightly, and do not touch it at all while you are wearing it. °Throw out disposable facemasks and gloves after using them. Do not reuse. °Wash your hands immediately after removing your facemask and gloves. °If your personal clothing becomes dirty with a patient's body fluids, carefully remove clothing and launder. Wash your hands after handling dirty clothing. °Place all used disposable facemasks, gloves, and other waste in a lined container before disposing them with other household garbage.  °Remove gloves and wash your hands immediately after handling these items. ° °Do not share dishes, glasses, or other household items with the patient °Avoid sharing household items. You should not share dishes, drinking glasses, cups, eating utensils, towels, bedding, or other items with a patient who is confirmed to have, or being evaluated for, COVID-19 infection.  °After the person uses these items, you should wash them very well with soap and   water. ° °Wash laundry thoroughly °Immediately remove and wash clothes or bedding that have blood, body fluids, and/or secretions or excretions, such as sweat, saliva, sputum, nasal mucus, vomit, urine, or feces, on them.  °Wear gloves when handling laundry from the patient.  °Read and follow directions on labels of laundry or clothing items and detergent. In general, wash and dry with the warmest temperatures recommended on the label. ° °Clean all areas the individual has used  °Clean all touchable surfaces, such as counters, tabletops, doorknobs, bathroom fixtures, toilets, phones, keyboards, tablets, and bedside tables, every day. Also, clean any surfaces that may have blood, body fluids, and/or secretions or excretions on them. Wear gloves when cleaning surfaces the patient has come in contact with.  °Use a diluted bleach solution (dilute bleach with 1 part bleach and 10 parts  water) or a household disinfectant with a label that says EPA-registered for coronaviruses. To make a bleach solution at home, add 1 tablespoon of bleach to 1 quart (4 cups) of water. For a larger supply, add ¼ cup of bleach to 1 gallon (16 cups) of water.  °Read labels of cleaning products and follow recommendations provided on product labels. Labels contain instructions for safe and effective use of the cleaning product including precautions you should take when applying the product, such as wearing gloves or eye protection and making sure you have good ventilation during use of the product.  °Remove gloves and wash hands immediately after cleaning. ° °Monitor yourself for signs and symptoms of illness °Caregivers and household members are considered close contacts, should monitor their health, and will be asked to limit movement outside of the home as much as possible.  ° °If you have additional questions °Contact  °Your Doctor or Healthcare Provider °The [facility] website (http://www.[facility].edu/coronavirus) °The [facility] COVID hotline 336-70COVID °Your local health department  ° °This guidance is subject to change. For the most up to date guidance, check the state Department of Health website at [facility]CD[facility].GOV ° °

## 2018-08-27 ENCOUNTER — Ambulatory Visit (INDEPENDENT_AMBULATORY_CARE_PROVIDER_SITE_OTHER): Payer: Medicare Other | Admitting: Internal Medicine

## 2018-08-27 ENCOUNTER — Telehealth: Payer: Self-pay

## 2018-08-27 ENCOUNTER — Encounter: Payer: Self-pay | Admitting: Internal Medicine

## 2018-08-27 ENCOUNTER — Other Ambulatory Visit: Payer: Self-pay

## 2018-08-27 VITALS — BP 128/82 | HR 76 | Temp 99.5°F | Ht 67.0 in | Wt 221.0 lb

## 2018-08-27 DIAGNOSIS — J4520 Mild intermittent asthma, uncomplicated: Secondary | ICD-10-CM | POA: Diagnosis not present

## 2018-08-27 DIAGNOSIS — R05 Cough: Secondary | ICD-10-CM

## 2018-08-27 DIAGNOSIS — R059 Cough, unspecified: Secondary | ICD-10-CM

## 2018-08-27 DIAGNOSIS — J0100 Acute maxillary sinusitis, unspecified: Secondary | ICD-10-CM | POA: Diagnosis not present

## 2018-08-27 LAB — CULTURE, BLOOD (ROUTINE X 2)
CULTURE: NO GROWTH
Culture: NO GROWTH
Special Requests: ADEQUATE

## 2018-08-27 MED ORDER — PREDNISONE 10 MG PO TABS: ORAL_TABLET | ORAL | 0 refills | Status: AC

## 2018-08-27 MED ORDER — CLARITHROMYCIN 500 MG PO TABS: 500.0000 mg | ORAL_TABLET | Freq: Two times a day (BID) | ORAL | 0 refills | Status: AC

## 2018-08-27 NOTE — Patient Instructions (Signed)
Plain Mucinex - take twice a day - to loose phlegm in your chest

## 2018-08-27 NOTE — Progress Notes (Signed)
Date:  08/27/2018   Name:  Audrey Martinez   DOB:  05-22-1946   MRN:  [id number]   Chief Complaint: Asthma (Wants spacer called in. Some what better since seen in ED 3/25. Waking up at night struggles to breath. Tight Chest no fever. Finished steroids yesterday.)  Cough  This is a new problem. The current episode started in the past 7 days. The problem has been unchanged. The problem occurs every few minutes. The cough is productive of sputum. Associated symptoms include a fever, headaches, postnasal drip and shortness of breath. Pertinent negatives include no chest pain, chills, heartburn, sore throat or wheezing. The symptoms are aggravated by lying down. She has tried oral steroids and a beta-agonist inhaler for the symptoms. The treatment provided no relief. Her past medical history is significant for asthma and environmental allergies.  Seen in ER 5 days ago.  She had been on self quarantine for the previous week, in no contact with anyone who was ill.  CXR negative for pneumonia.  She improved with a nebulizer treatment and was discharged on 4 days of prednisone. Influenza tests negative.   She feels about 30% better after prednisone.  She is blowing out some yellow nasal discharge but sputum does not come up.  Lab Results  Component Value Date   WBC 7.3 08/22/2018   HGB 15.8 (H) 08/22/2018   HCT 46.9 (H) 08/22/2018   MCV 92.9 08/22/2018   PLT 197 08/22/2018   Lab Results  Component Value Date   CREATININE 1.01 (H) 08/22/2018   BUN 13 08/22/2018   NA 137 08/22/2018   K 3.9 08/22/2018   CL 103 08/22/2018   CO2 22 08/22/2018     Review of Systems  Constitutional: Positive for fever. Negative for chills.  HENT: Positive for congestion, postnasal drip and sinus pressure. Negative for sore throat.   Eyes: Negative for visual disturbance.  Respiratory: Positive for cough, chest tightness and shortness of breath. Negative for wheezing.   Cardiovascular: Negative for chest  pain and palpitations.  Gastrointestinal: Positive for diarrhea. Negative for abdominal pain, constipation, heartburn, nausea and vomiting.  Allergic/Immunologic: Positive for environmental allergies.  Neurological: Positive for headaches.  Hematological: Negative for adenopathy.  Psychiatric/Behavioral: Negative for dysphoric mood. The patient is not nervous/anxious.     Patient Active Problem List   Diagnosis Date Noted  . Colon cancer screening   . Arm pain, right 11/08/2017  . Renal insufficiency 11/08/2017  . Tendinitis of right knee 07/03/2017  . Lumbar radiculopathy 05/18/2017  . Status post hemilaminotomy 05/18/2017  . Lumbar degenerative disc disease 05/18/2017  . Spinal stenosis of lumbar region without neurogenic claudication 05/18/2017  . Atrial tachycardia ([place]) 04/14/2017  . SVT (supraventricular tachycardia) ([place]) 04/14/2017  . Essential hypertension 04/14/2017  . Reactive airway disease 04/14/2017  . Osteoporosis 03/21/2017  . Benign essential tremor 03/21/2017  . Obesity 09/14/2011  . Hyperlipidemia 05/12/2003  . Personal history of transient ischemic attack (TIA), and cerebral infarction without residual deficits 04/22/2003    Allergies  Allergen Reactions  . Aspirin Anaphylaxis  . Ciprofloxacin Rash  . Ciprofloxacin-Dexamethasone Anaphylaxis  . Crestor [Rosuvastatin] Other (See Comments)    Caused problems with memory.  . Morphine And Related Nausea Only  . Amoxicillin Hives  . Doxycycline Hyclate Hives  . Hydrochlorothiazide Other (See Comments)    Raises her blood pressure.   Marland Kitchen Keflex [Cephalexin] Hives  . Lovastatin Other (See Comments)    "Unable to function"  . Simvastatin  Other (See Comments)    "Unable to function"  . Erythromycin Hives  . Ibuprofen Other (See Comments)    Sharp stomach pains  . Neomycin-Bacitracin Zn-Polymyx Hives and Rash  . Omeprazole Palpitations    Tachycardia and same sensation as her SVTs per patient  . Tape Hives  and Rash    Past Surgical History:  Procedure Laterality Date  . BACK SURGERY  2017   L2-4 laminectomy and foraminal stenosis  . BREAST CYST ASPIRATION Left 1977  . CARDIAC CATHETERIZATION  2009   [facility] in [place]  . CARDIAC ELECTROPHYSIOLOGY STUDY AND ABLATION  2009  . CATARACT EXTRACTION, BILATERAL  2010  . COLONOSCOPY WITH PROPOFOL N/A 03/21/2018   Procedure: COLONOSCOPY WITH Biopsy;  Surgeon: Lin Landsman, MD;  Location: [facility];  Service: Endoscopy;  Laterality: N/A;  . derrick procedure  1977-78   bilateral   . ESOPHAGOGASTRODUODENOSCOPY  2013   gastritis; done for complaint of dysphagia  . ESOPHAGOGASTRODUODENOSCOPY (EGD) WITH PROPOFOL N/A 03/21/2018   Procedure: ESOPHAGOGASTRODUODENOSCOPY (EGD) WITH Biopsies;  Surgeon: Lin Landsman, MD;  Location: [facility];  Service: Endoscopy;  Laterality: N/A;  KEEP THIS PATIENT FIRST  . lymph node removal     neck  . MOHS SURGERY  07/2016   BCCA nose  . POLYPECTOMY N/A 03/21/2018   Procedure: POLYPECTOMY;  Surgeon: Lin Landsman, MD;  Location: [facility];  Service: Endoscopy;  Laterality: N/A;  . REPLACEMENT TOTAL KNEE  2013   RT  . TOTAL SHOULDER REPLACEMENT  2012   RT     Social History   Tobacco Use  . Smoking status: Never Smoker  . Smokeless tobacco: Never Used  Substance Use Topics  . Alcohol use: Yes    Comment: may drink 1-2x/yr  . Drug use: No     Medication list has been reviewed and updated.  Current Meds  Medication Sig  . albuterol (PROVENTIL HFA;VENTOLIN HFA) 108 (90 Base) MCG/ACT inhaler Inhale into the lungs every 6 (six) hours as needed for wheezing or shortness of breath.  . baclofen (LIORESAL) 10 MG tablet TAKE 1 TABLET BY MOUTH THREE TIMES A DAY (Patient taking differently: 2 (two) times daily. )  . calcium-vitamin D (OSCAL WITH D) 500-200 MG-UNIT tablet Take 1 tablet by mouth.  . cholecalciferol (VITAMIN D) 400 units TABS  tablet Take 400 Units by mouth.  . Cranberry 1000 MG CAPS Take by mouth.  . diltiazem (CARDIZEM CD) 180 MG 24 hr capsule TAKE 1 CAPSULE (180 MG TOTAL) DAILY BY MOUTH.  . diltiazem (CARDIZEM) 60 MG tablet Take 60 mg by mouth 4 (four) times daily.  Marland Kitchen FLOVENT HFA 110 MCG/ACT inhaler TAKE 2 PUFFS BY MOUTH TWICE A DAY  . gabapentin (NEURONTIN) 300 MG capsule Take 1 capsule (300 mg total) by mouth 3 (three) times daily. Three capsules daily at bedtime.  . Ginger 500 MG CAPS Take by mouth.  . Misc Natural Products (TART CHERRY ADVANCED PO) Take by mouth.  . Multiple Vitamins-Minerals (MULTIVITAMIN WITH MINERALS) tablet Take 1 tablet by mouth daily.  . NON FORMULARY Protleotic Enzymes  . Probiotic Product (PROBIOTIC DAILY PO) Take by mouth.  . STRIVERDI RESPIMAT 2.5 MCG/ACT AERS INHALE 2 PUFFS INTO THE LUNGS DAILY.  . Turmeric 500 MG CAPS Take by mouth.  . [DISCONTINUED] vitamin E 100 UNIT capsule Take by mouth daily.    PHQ 2/9 Scores 11/08/2017 07/04/2017 05/18/2017 05/03/2017  PHQ - 2 Score 0 0 0 0  BP Readings from Last 3 Encounters:  08/27/18 128/82  08/22/18 112/87  07/19/18 132/82    Physical Exam Vitals signs reviewed.  Constitutional:      General: She is not in acute distress.    Appearance: Normal appearance. She is not ill-appearing.  HENT:     Right Ear: Tympanic membrane and ear canal normal.     Left Ear: Tympanic membrane and ear canal normal.     Nose:     Right Sinus: Maxillary sinus tenderness and frontal sinus tenderness present.     Left Sinus: Maxillary sinus tenderness and frontal sinus tenderness present.  Neck:     Musculoskeletal: Normal range of motion.  Cardiovascular:     Rate and Rhythm: Normal rate and regular rhythm.     Heart sounds: Normal heart sounds. No murmur.  Pulmonary:     Effort: Pulmonary effort is normal.     Breath sounds: Examination of the right-lower field reveals decreased breath sounds. Examination of the left-lower field reveals  decreased breath sounds. Decreased breath sounds present. No wheezing or rhonchi.  Lymphadenopathy:     Cervical: No cervical adenopathy.  Neurological:     Mental Status: She is alert.     Wt Readings from Last 3 Encounters:  08/27/18 221 lb (100.2 kg)  08/22/18 232 lb (105.2 kg)  07/19/18 230 lb 12.8 oz (104.7 kg)    BP 128/82   Pulse 76   Temp 99.5 F (37.5 C) (Oral)   Ht 5\' 7"  (1.702 m)   Wt 221 lb (100.2 kg)   SpO2 93%   BMI 34.61 kg/m   Assessment and Plan: 1. Acute non-recurrent maxillary sinusitis Can also take tylenol as needed for HA and low grade fever - clarithromycin (BIAXIN) 500 MG tablet; Take 1 tablet (500 mg total) by mouth 2 (two) times daily for 10 days.  Dispense: 20 tablet; Refill: 0  2. Cough in adult Begin mucinex -plain - bid  3. Mild intermittent asthma without complication Needs longer course of prednisone Use albuterol with spacer 2 puffs every 4 hours if needed - predniSONE (DELTASONE) 10 MG tablet; Take 6 on day 1and 2, 5 on day 3 and 4, 4 on day 5 and 6 , 3 on day 7 and 8, 2 on day 9 and 10 and 1 on day 11 and 12 then stop.  Dispense: 42 tablet; Refill: 0   Partially dictated using Editor, commissioning. Any errors are unintentional.  Halina Maidens, MD [facility]  08/27/2018

## 2018-08-27 NOTE — Telephone Encounter (Signed)
Advised to not take Abx with the Diltaziem 180 mg. Must wait 4 hours between. Also do not take the as needed 60 mg Diltaziem without calling PCP. Also needs to monitor BP per PCP. Discussed with pharmacist Caryl Pina at [facility] and also talked to Patient.

## 2018-09-06 ENCOUNTER — Ambulatory Visit: Payer: Medicare Other | Admitting: Speech Pathology

## 2018-09-10 ENCOUNTER — Ambulatory Visit: Payer: Medicare Other | Admitting: Speech Pathology

## 2018-09-13 ENCOUNTER — Ambulatory Visit: Payer: Medicare Other | Admitting: Speech Pathology

## 2018-09-17 ENCOUNTER — Ambulatory Visit: Payer: Medicare Other | Admitting: Speech Pathology

## 2018-09-20 ENCOUNTER — Ambulatory Visit: Payer: Medicare Other | Admitting: Speech Pathology

## 2018-09-27 ENCOUNTER — Other Ambulatory Visit: Payer: Self-pay | Admitting: Internal Medicine

## 2018-10-01 ENCOUNTER — Ambulatory Visit: Payer: Medicare Other | Admitting: Speech Pathology

## 2018-10-04 ENCOUNTER — Ambulatory Visit: Payer: Medicare Other | Admitting: Speech Pathology

## 2018-10-08 DIAGNOSIS — B078 Other viral warts: Secondary | ICD-10-CM | POA: Diagnosis not present

## 2018-10-09 ENCOUNTER — Ambulatory Visit: Payer: Medicare Other | Admitting: Speech Pathology

## 2018-10-15 ENCOUNTER — Ambulatory Visit: Payer: Medicare Other | Admitting: Speech Pathology

## 2018-10-16 DIAGNOSIS — J301 Allergic rhinitis due to pollen: Secondary | ICD-10-CM | POA: Diagnosis not present

## 2018-10-16 DIAGNOSIS — R42 Dizziness and giddiness: Secondary | ICD-10-CM | POA: Diagnosis not present

## 2018-10-16 DIAGNOSIS — J309 Allergic rhinitis, unspecified: Secondary | ICD-10-CM | POA: Diagnosis not present

## 2018-10-16 DIAGNOSIS — R490 Dysphonia: Secondary | ICD-10-CM | POA: Diagnosis not present

## 2018-10-18 ENCOUNTER — Ambulatory Visit: Payer: Medicare Other | Admitting: Speech Pathology

## 2018-10-24 ENCOUNTER — Other Ambulatory Visit: Payer: Self-pay

## 2018-10-24 ENCOUNTER — Ambulatory Visit: Payer: Medicare Other | Attending: Unknown Physician Specialty | Admitting: Speech Pathology

## 2018-10-24 ENCOUNTER — Encounter: Payer: Self-pay | Admitting: Speech Pathology

## 2018-10-24 DIAGNOSIS — R490 Dysphonia: Secondary | ICD-10-CM

## 2018-10-24 NOTE — Therapy (Signed)
[place] MAIN [facility] SERVICES [address] [place], [place], [address] Phone: [phone]   Fax:  [phone]  Speech Language Pathology Evaluation  Patient Details  Name: Audrey Martinez MRN: [id number] Date of Birth: 05-22-1946 Referring Provider (SLP): Dr. Pryor Ochoa   Encounter Date: 10/24/2018  End of Session - 10/24/18 1502    Visit Number  1    Number of Visits  17    Date for SLP Re-Evaluation  12/24/18    Authorization Type  Medicare    Authorization - Visit Number  1    Authorization - Number of Visits  10    SLP Start Time  1100    SLP Stop Time   1150    SLP Time Calculation (min)  50 min    Activity Tolerance  Patient tolerated treatment well       Past Medical History:  Diagnosis Date   Arthritis    Asthma    GERD (gastroesophageal reflux disease)    Herniated disc, cervical    Left wrist fracture    Stroke ([place]) 1998   SVT (supraventricular tachycardia) ([place])     Past Surgical History:  Procedure Laterality Date   BACK SURGERY  2017   L2-4 laminectomy and foraminal stenosis   BREAST CYST ASPIRATION Left [place]  2009   [facility] in [place]  2009   CATARACT EXTRACTION, BILATERAL  2010   COLONOSCOPY WITH PROPOFOL N/A 03/21/2018   Procedure: COLONOSCOPY WITH Biopsy;  Surgeon: Lin Landsman, MD;  Location: [facility];  Service: Endoscopy;  Laterality: N/A;   derrick procedure  1977-78   bilateral    ESOPHAGOGASTRODUODENOSCOPY  2013   gastritis; done for complaint of dysphagia   ESOPHAGOGASTRODUODENOSCOPY (EGD) WITH PROPOFOL N/A 03/21/2018   Procedure: ESOPHAGOGASTRODUODENOSCOPY (EGD) WITH Biopsies;  Surgeon: Lin Landsman, MD;  Location: [facility];  Service: Endoscopy;  Laterality: N/A;  KEEP THIS PATIENT FIRST   lymph node removal     neck   MOHS SURGERY  07/2016   BCCA nose   POLYPECTOMY N/A 03/21/2018   Procedure: POLYPECTOMY;  Surgeon: Lin Landsman, MD;  Location: [facility];  Service: Endoscopy;  Laterality: N/A;   REPLACEMENT TOTAL KNEE  2013   RT   TOTAL SHOULDER REPLACEMENT  2012   RT     There were no vitals filed for this visit.      SLP Evaluation [facility] - 10/24/18 0001      SLP Visit Information   SLP Received On  10/24/18    Referring Provider (SLP)  Dr. Pryor Ochoa    Onset Date  08/08/2018    Medical Diagnosis  Dysphonia      Subjective   Subjective   "It was so bad no one could understand what I was saying!"    Patient/Family Stated Goal  Clear vocal quality      General Information   HPI  73 year old woman referred for speech therapy for dysphonia.  Abnormal laryngeal findings include erythema, cobblestoning, edema, and bilateral muscle tension dysphonia with touching of the false vocal folds with phonation.        Prior Functional Status   Cognitive/Linguistic Baseline  Within functional limits      Oral Motor/Sensory Function   Overall Oral Motor/Sensory Function  Appears within functional limits for tasks assessed      Motor Speech   Overall Motor Speech  Impaired    Respiration  Impaired    Level of Impairment  Conversation    Phonation  Hoarse    Resonance  Within functional limits    Articulation  Within functional limitis    Intelligibility  Intelligible    Phonation  Impaired    Vocal Abuses  Habitual Hyperphonia    Tension Present  Jaw;Neck;Shoulder    Volume  Appropriate    Pitch  Low      Standardized Assessments   Standardized Assessments   Other Assessment   Perceptual Voice Evaluation       Perceptual Voice Evaluation Voice checklist:  Health risks: GERD, allergies   Characteristic voice use: occasional lengthy phone conversations with sisters, otherwise not excessive talking  Environmental risks: none identified  Misuse: excessive talking when ill, speaking without good  breath support  Abuse: vigorous coughing at times, poor technique for projecting voice  Vocal characteristics: hoarse/raspy, limited voice range, poor vocal projection, excessive pharyngeal resonance, low habitual pitch Maximum phonation time for sustained ah: 11 seconds Average fundamental frequency during sustained ah: 106 Hz (5 STD below average for gender) Habitual pitch: 146 Hz Highest dynamic pitch when altering pitch from a low note to a high note: 457 Hz Lowest dynamic pitch when altering from a high note to a low note: 85 Hz Highest dynamic pitch in conversational speech: 295 Hz Lowest dynamic pitch in conversational speech: 97 Hz Average time patient was able to sustain /s/: 12 seconds Average time patient was able to sustain /z/: 11 seconds s/z ratio : 1.1 Visi-Pitch: Multi-Dimensional Voice Program (MDVP)  MDVP extracts objective quantitative values (Relative Average Perturbation, Shimmer, Voice Turbulence Index, and Noise to Harmonic Ratio) on sustained phonation, which are displayed graphically and numerically in comparison to a built-in normative database.  The patient exhibited values outside the norm for Relative Average Perturbation, Shimmer, Voice Turbulence Index, and Noise to Harmonic Ratio.  Average fundamental frequency was 5 STD below average for age and gender.  Patient able to improve all parameters with model (Loud like me)   SLP Education - 10/24/18 1501    Education Details  Voice therapy    Person(s) Educated  Patient    Methods  Explanation    Comprehension  Verbalized understanding         SLP Long Term Goals - 10/24/18 1504      SLP LONG TERM GOAL #1   Title  The patient will demonstrate independent understanding of vocal hygiene concepts and extrinsic laryngeal muscle stretches.      Time  8    Period  Weeks    Status  New    Target Date  12/24/18      SLP LONG TERM GOAL #2   Title  The patient will be independent for abdominal breathing  and breath support exercises.    Time  8    Period  Weeks    Status  New    Target Date  12/24/18      SLP LONG TERM GOAL #3   Title  The patient will minimize vocal tension via resonant voice therapy (or comparable technique) with min SLP cues with 80% accuracy.    Time  4    Period  Weeks    Status  New    Target Date  11/24/18      SLP LONG TERM GOAL #4   Title  The patient will maintain relaxed phonation / oral resonance for paragraph length recitation with 80% accuracy.  Time  8    Period  Weeks    Status  New    Target Date  12/24/18       Plan - 10/24/18 1503    Clinical Impression Statement  This 73 year old woman under the care of Dr. Pryor Ochoa, with muscle tension dysphonia as well as effects of reflux, is presenting with moderate dysphonia.  The patient reports improvements since beginning the Gaviscon.  The patient demonstrates hoarse/raspy vocal quality, reduced breath control for speech, excessive pharyngeal resonance, strained/tense phonation, low habitual pitch, and laryngeal tension. She will benefit from voice therapy for education, to improve breath support, improve tone focus, promote easy flow phonation, and learn techniques to increase loudness and pitch range without strain.    Speech Therapy Frequency  2x / week    Duration  Other (comment)   8 weeks   Treatment/Interventions  Patient/family education;Other (comment)   Voice therapy   Potential to Achieve Goals  Good    Potential Considerations  Ability to learn/carryover information;Co-morbidities;Severity of impairments;Cooperation/participation level;Medical prognosis;Family/community support    SLP Home Exercise Plan  TBD    Consulted and Agree with Plan of Care  Patient       Patient will benefit from skilled therapeutic intervention in order to improve the following deficits and impairments:   Dysphonia - Plan: SLP plan of care cert/re-cert    Problem List Patient Active Problem List   Diagnosis  Date Noted   Colon cancer screening    Arm pain, right 11/08/2017   Renal insufficiency 11/08/2017   Tendinitis of right knee 07/03/2017   Lumbar radiculopathy 05/18/2017   Status post hemilaminotomy 05/18/2017   Lumbar degenerative disc disease 05/18/2017   Spinal stenosis of lumbar region without neurogenic claudication 05/18/2017   Atrial tachycardia ([place]) 04/14/2017   SVT (supraventricular tachycardia) ([place]) 04/14/2017   Essential hypertension 04/14/2017   Reactive airway disease 04/14/2017   Osteoporosis 03/21/2017   Benign essential tremor 03/21/2017   Obesity 09/14/2011   Hyperlipidemia 05/12/2003   Personal history of transient ischemic attack (TIA), and cerebral infarction without residual deficits 04/22/2003   Audrey Sea, MS/CCC- SLP  Audrey Martinez 10/24/2018, 3:09 PM  [place] MAIN [facility] SERVICES [address] [place], [place], [address] Phone: [phone]   Fax:  [phone]  Name: Audrey Martinez MRN: [id number] Date of Birth: Dec 10, 1945

## 2018-10-29 ENCOUNTER — Ambulatory Visit: Payer: Medicare Other | Admitting: Speech Pathology

## 2018-10-31 ENCOUNTER — Other Ambulatory Visit: Payer: Self-pay

## 2018-10-31 ENCOUNTER — Ambulatory Visit: Payer: Medicare Other | Attending: Unknown Physician Specialty | Admitting: Speech Pathology

## 2018-10-31 DIAGNOSIS — R490 Dysphonia: Secondary | ICD-10-CM | POA: Diagnosis not present

## 2018-10-31 DIAGNOSIS — R2689 Other abnormalities of gait and mobility: Secondary | ICD-10-CM | POA: Insufficient documentation

## 2018-10-31 DIAGNOSIS — R2681 Unsteadiness on feet: Secondary | ICD-10-CM | POA: Insufficient documentation

## 2018-11-01 ENCOUNTER — Encounter: Payer: Self-pay | Admitting: Speech Pathology

## 2018-11-01 NOTE — Therapy (Signed)
[place] MAIN [facility] SERVICES [address] [place], [place], [address] Phone: [phone]   Fax:  [phone]  Speech Language Pathology Treatment  Patient Details  Name: Audrey Martinez MRN: [id number] Date of Birth: 22-Feb-1946 Referring Provider (SLP): Dr. Pryor Martinez   Encounter Date: 10/31/2018  End of Session - 11/01/18 1420    Visit Number  2    Number of Visits  17    Date for SLP Re-Evaluation  12/24/18    Authorization Type  Medicare    Authorization Time Period  Start 10/26/2018    Authorization - Number of Visits  10    SLP Start Time  1000    SLP Stop Time   1051    SLP Time Calculation (min)  51 min    Activity Tolerance  Patient tolerated treatment well       Past Medical History:  Diagnosis Date  . Arthritis   . Asthma   . GERD (gastroesophageal reflux disease)   . Herniated disc, cervical   . Left wrist fracture   . Stroke ([place]) 1998  . SVT (supraventricular tachycardia) (HCC)     Past Surgical History:  Procedure Laterality Date  . BACK SURGERY  2017   L2-4 laminectomy and foraminal stenosis  . BREAST CYST ASPIRATION Left 1977  . CARDIAC CATHETERIZATION  2009   Audrey Martinez in [place]  . CARDIAC ELECTROPHYSIOLOGY STUDY AND ABLATION  2009  . CATARACT EXTRACTION, BILATERAL  2010  . COLONOSCOPY WITH PROPOFOL N/A 03/21/2018   Procedure: COLONOSCOPY WITH Biopsy;  Surgeon: Audrey Landsman, MD;  Location: [place];  Service: Endoscopy;  Laterality: N/A;  . derrick procedure  1977-78   bilateral   . ESOPHAGOGASTRODUODENOSCOPY  2013   gastritis; done for complaint of dysphagia  . ESOPHAGOGASTRODUODENOSCOPY (EGD) WITH PROPOFOL N/A 03/21/2018   Procedure: ESOPHAGOGASTRODUODENOSCOPY (EGD) WITH Biopsies;  Surgeon: Audrey Landsman, MD;  Location: [place];  Service: Endoscopy;  Laterality: N/A;  KEEP THIS PATIENT FIRST  . lymph node removal     neck  . MOHS SURGERY   07/2016   BCCA nose  . POLYPECTOMY N/A 03/21/2018   Procedure: POLYPECTOMY;  Surgeon: Audrey Landsman, MD;  Location: [place];  Service: Endoscopy;  Laterality: N/A;  . REPLACEMENT TOTAL KNEE  2013   RT  . TOTAL SHOULDER REPLACEMENT  2012   RT     There were no vitals filed for this visit.  Subjective Assessment - 11/01/18 1419    Subjective  The patient is eager to learn to have better vocal quality            ADULT SLP TREATMENT - 11/01/18 0001      General Information   Behavior/Cognition  Alert;Cooperative;Pleasant mood    HPI  73 year old woman referred for speech therapy for dysphonia.  Abnormal laryngeal findings include erythema, cobblestoning, edema, and bilateral muscle tension dysphonia with touching of the false vocal folds with phonation.         Treatment Provided   Treatment provided  Cognitive-Linquistic      Pain Assessment   Pain Assessment  No/denies pain      Cognitive-Linquistic Treatment   Treatment focused on  Voice    Skilled Treatment The patient was provided with written and verbal teaching regarding vocal hygiene.  The patient was provided with written and verbal teaching regarding neck, shoulder, tongue, and throat stretches exercises to promote relaxed phonation. The patient was provided with  written and verbal teaching regarding breath support exercises.  The patient was provided with verbal and written instruction in semi-occluded vocal tract exercises: "WOO" and "WOO" contours of loudness.  The patient is able to maintain relaxed phonation with 50 to 60% accuracy.  She is able to sense and hear tense phonation and repair inconsistently.       Assessment / Recommendations / Plan   Plan  Continue with current plan of care      Progression Toward Goals   Progression toward goals  Progressing toward goals       SLP Education - 11/01/18 1419    Education Details  Resonant voice    Person(s) Educated  Patient    Methods   Explanation    Comprehension  Verbalized understanding         SLP Long Term Goals - 10/24/18 1504      SLP LONG TERM GOAL #1   Title  The patient will demonstrate independent understanding of vocal hygiene concepts and extrinsic laryngeal muscle stretches.      Time  8    Period  Weeks    Status  New    Target Date  12/24/18      SLP LONG TERM GOAL #2   Title  The patient will be independent for abdominal breathing and breath support exercises.    Time  8    Period  Weeks    Status  New    Target Date  12/24/18      SLP LONG TERM GOAL #3   Title  The patient will minimize vocal tension via resonant voice therapy (or comparable technique) with min SLP cues with 80% accuracy.    Time  4    Period  Weeks    Status  New    Target Date  11/24/18      SLP LONG TERM GOAL #4   Title  The patient will maintain relaxed phonation / oral resonance for paragraph length recitation with 80% accuracy.    Time  8    Period  Weeks    Status  New    Target Date  12/24/18       Plan - 11/01/18 1421    Clinical Impression Statement  Patient able to improve vocal quality with semi-occluded vocal tract to improve oral resonance and decrease laryngeal strain.  She is able to sense and hear tense phonation and inconsistently repair.    Speech Therapy Frequency  2x / week    Duration  Other (comment)    Treatment/Interventions  Patient/family education;Other (comment)   Voice therapy   Potential to Achieve Goals  Good    Potential Considerations  Ability to learn/carryover information;Co-morbidities;Severity of impairments;Cooperation/participation level;Medical prognosis;Family/community support    SLP Home Exercise Plan  Provided    Consulted and Agree with Plan of Care  Patient       Patient will benefit from skilled therapeutic intervention in order to improve the following deficits and impairments:   Dysphonia    Problem List Patient Active Problem List   Diagnosis Date Noted   . Colon cancer screening   . Arm pain, right 11/08/2017  . Renal insufficiency 11/08/2017  . Tendinitis of right knee 07/03/2017  . Lumbar radiculopathy 05/18/2017  . Status post hemilaminotomy 05/18/2017  . Lumbar degenerative disc disease 05/18/2017  . Spinal stenosis of lumbar region without neurogenic claudication 05/18/2017  . Atrial tachycardia ([place]) 04/14/2017  . SVT (supraventricular tachycardia) ([place]) 04/14/2017  . Essential  hypertension 04/14/2017  . Reactive airway disease 04/14/2017  . Osteoporosis 03/21/2017  . Benign essential tremor 03/21/2017  . Obesity 09/14/2011  . Hyperlipidemia 05/12/2003  . Personal history of transient ischemic attack (TIA), and cerebral infarction without residual deficits 04/22/2003   Leroy Sea, MS/CCC- SLP  Lou Miner 11/01/2018, 2:22 PM  [place] MAIN [facility] SERVICES [address] [place], [place], [address] Phone: [phone]   Fax:  [phone]   Name: Audrey Martinez MRN: [id number] Date of Birth: 1945-08-01

## 2018-11-02 ENCOUNTER — Ambulatory Visit: Payer: Medicare Other | Admitting: Speech Pathology

## 2018-11-02 ENCOUNTER — Encounter: Payer: Self-pay | Admitting: Speech Pathology

## 2018-11-02 ENCOUNTER — Other Ambulatory Visit: Payer: Self-pay

## 2018-11-02 DIAGNOSIS — R2689 Other abnormalities of gait and mobility: Secondary | ICD-10-CM | POA: Diagnosis not present

## 2018-11-02 DIAGNOSIS — R490 Dysphonia: Secondary | ICD-10-CM

## 2018-11-02 DIAGNOSIS — R2681 Unsteadiness on feet: Secondary | ICD-10-CM | POA: Diagnosis not present

## 2018-11-02 NOTE — Therapy (Signed)
[place] MAIN [facility] SERVICES [address] [place], [place], [address] Phone: [phone]   Fax:  [phone]  Speech Language Pathology Treatment  Patient Details  Name: Audrey Martinez MRN: [id number] Date of Birth: 03/28/1946 Referring Provider (SLP): Dr. Pryor Ochoa   Encounter Date: 11/02/2018  End of Session - 11/02/18 1010    Visit Number  3    Number of Visits  17    Date for SLP Re-Evaluation  12/24/18    Authorization Type  Medicare    Authorization Time Period  Start 10/26/2018    Authorization - Visit Number  3    Authorization - Number of Visits  10    SLP Start Time  0915    SLP Stop Time   1000    SLP Time Calculation (min)  45 min    Activity Tolerance  Patient tolerated treatment well       Past Medical History:  Diagnosis Date  . Arthritis   . Asthma   . GERD (gastroesophageal reflux disease)   . Herniated disc, cervical   . Left wrist fracture   . Stroke ([place]) 1998  . SVT (supraventricular tachycardia) (HCC)     Past Surgical History:  Procedure Laterality Date  . BACK SURGERY  2017   L2-4 laminectomy and foraminal stenosis  . BREAST CYST ASPIRATION Left 1977  . CARDIAC CATHETERIZATION  2009   [facility] in [place]  . CARDIAC ELECTROPHYSIOLOGY STUDY AND ABLATION  2009  . CATARACT EXTRACTION, BILATERAL  2010  . COLONOSCOPY WITH PROPOFOL N/A 03/21/2018   Procedure: COLONOSCOPY WITH Biopsy;  Surgeon: Lin Landsman, MD;  Location: [place];  Service: Endoscopy;  Laterality: N/A;  . derrick procedure  1977-78   bilateral   . ESOPHAGOGASTRODUODENOSCOPY  2013   gastritis; done for complaint of dysphagia  . ESOPHAGOGASTRODUODENOSCOPY (EGD) WITH PROPOFOL N/A 03/21/2018   Procedure: ESOPHAGOGASTRODUODENOSCOPY (EGD) WITH Biopsies;  Surgeon: Lin Landsman, MD;  Location: [place];  Service: Endoscopy;  Laterality: N/A;  KEEP THIS PATIENT FIRST  . lymph node  removal     neck  . MOHS SURGERY  07/2016   BCCA nose  . POLYPECTOMY N/A 03/21/2018   Procedure: POLYPECTOMY;  Surgeon: Lin Landsman, MD;  Location: [place];  Service: Endoscopy;  Laterality: N/A;  . REPLACEMENT TOTAL KNEE  2013   RT  . TOTAL SHOULDER REPLACEMENT  2012   RT     There were no vitals filed for this visit.  Subjective Assessment - 11/02/18 1010    Subjective  The patient reports that her sister has observed an improvement in her voice.            ADULT SLP TREATMENT - 11/02/18 0001      General Information   Behavior/Cognition  Alert;Cooperative;Pleasant mood    HPI  73 year old woman referred for speech therapy for dysphonia.  Abnormal laryngeal findings include erythema, cobblestoning, edema, and bilateral muscle tension dysphonia with touching of the false vocal folds with phonation.         Treatment Provided   Treatment provided  Cognitive-Linquistic      Pain Assessment   Pain Assessment  No/denies pain      Cognitive-Linquistic Treatment   Treatment focused on  Voice    Skilled Treatment  The patient was provided with written and verbal teaching regarding vocal hygiene.  The patient was provided with written and verbal teaching regarding neck, shoulder, tongue, and  throat stretches exercises to promote relaxed phonation. The patient was provided with written and verbal teaching regarding breath support exercises.  The patient was provided with verbal and written instruction in semi-occluded vocal tract exercises: "WOO" and "WOO" contours of loudness.  The patient is able to maintain relaxed phonation with 80 to 90% accuracy.  Patient able to maintain relaxed phonation for reading 3-word phrases and generating phrases with 60% accuracy. She is able to sense and hear tense phonation and repair inconsistently.        Assessment / Recommendations / Plan   Plan  Continue with current plan of care      Progression Toward Goals    Progression toward goals  Progressing toward goals       SLP Education - 11/02/18 1010    Education Details  Resonant voice    Person(s) Educated  Patient    Methods  Explanation    Comprehension  Verbalized understanding         SLP Long Term Goals - 10/24/18 1504      SLP LONG TERM GOAL #1   Title  The patient will demonstrate independent understanding of vocal hygiene concepts and extrinsic laryngeal muscle stretches.      Time  8    Period  Weeks    Status  New    Target Date  12/24/18      SLP LONG TERM GOAL #2   Title  The patient will be independent for abdominal breathing and breath support exercises.    Time  8    Period  Weeks    Status  New    Target Date  12/24/18      SLP LONG TERM GOAL #3   Title  The patient will minimize vocal tension via resonant voice therapy (or comparable technique) with min SLP cues with 80% accuracy.    Time  4    Period  Weeks    Status  New    Target Date  11/24/18      SLP LONG TERM GOAL #4   Title  The patient will maintain relaxed phonation / oral resonance for paragraph length recitation with 80% accuracy.    Time  8    Period  Weeks    Status  New    Target Date  12/24/18       Plan - 11/02/18 1011    Clinical Impression Statement  Patient able to improve vocal quality with semi-occluded vocal tract to improve oral resonance and decrease laryngeal strain.  She is able to sense and hear tense phonation and inconsistently repair.  Her overall vocal quality is improved.    Speech Therapy Frequency  2x / week    Duration  Other (comment)    Treatment/Interventions  Patient/family education;Other (comment)   Voice therapy   Potential Considerations  Ability to learn/carryover information;Co-morbidities;Severity of impairments;Cooperation/participation level;Medical prognosis;Family/community support    SLP Home Exercise Plan  Provided    Consulted and Agree with Plan of Care  Patient       Patient will benefit from  skilled therapeutic intervention in order to improve the following deficits and impairments:   Dysphonia    Problem List Patient Active Problem List   Diagnosis Date Noted  . Colon cancer screening   . Arm pain, right 11/08/2017  . Renal insufficiency 11/08/2017  . Tendinitis of right knee 07/03/2017  . Lumbar radiculopathy 05/18/2017  . Status post hemilaminotomy 05/18/2017  . Lumbar degenerative disc disease 05/18/2017  .  Spinal stenosis of lumbar region without neurogenic claudication 05/18/2017  . Atrial tachycardia ([place]) 04/14/2017  . SVT (supraventricular tachycardia) ([place]) 04/14/2017  . Essential hypertension 04/14/2017  . Reactive airway disease 04/14/2017  . Osteoporosis 03/21/2017  . Benign essential tremor 03/21/2017  . Obesity 09/14/2011  . Hyperlipidemia 05/12/2003  . Personal history of transient ischemic attack (TIA), and cerebral infarction without residual deficits 04/22/2003   Leroy Sea, MS/CCC- SLP  Lou Miner 11/02/2018, 10:12 AM  [place] MAIN [facility] SERVICES [address] [place], [place], [address] Phone: [phone]   Fax:  [phone]   Name: Audrey Martinez MRN: [id number] Date of Birth: 12/11/1945

## 2018-11-05 ENCOUNTER — Encounter: Payer: Self-pay | Admitting: Speech Pathology

## 2018-11-05 ENCOUNTER — Ambulatory Visit: Payer: Medicare Other | Admitting: Speech Pathology

## 2018-11-05 ENCOUNTER — Other Ambulatory Visit: Payer: Self-pay

## 2018-11-05 DIAGNOSIS — R2681 Unsteadiness on feet: Secondary | ICD-10-CM | POA: Diagnosis not present

## 2018-11-05 DIAGNOSIS — R2689 Other abnormalities of gait and mobility: Secondary | ICD-10-CM | POA: Diagnosis not present

## 2018-11-05 DIAGNOSIS — R490 Dysphonia: Secondary | ICD-10-CM

## 2018-11-05 NOTE — Therapy (Signed)
[place] MAIN [facility] SERVICES [address] [place], [place], [address] Phone: [phone]   Fax:  [phone]  Speech Language Pathology Treatment  Patient Details  Name: Audrey Martinez MRN: [id number] Date of Birth: Apr 29, 1946 Referring Provider (SLP): Dr. Pryor Ochoa   Encounter Date: 11/05/2018  End of Session - 11/05/18 1112    Visit Number  4    Number of Visits  17    Date for SLP Re-Evaluation  12/24/18    Authorization Type  Medicare    Authorization Time Period  Start 10/26/2018    Authorization - Visit Number  4    Authorization - Number of Visits  10    SLP Start Time  1000    SLP Stop Time   1050    SLP Time Calculation (min)  50 min    Activity Tolerance  Patient tolerated treatment well       Past Medical History:  Diagnosis Date  . Arthritis   . Asthma   . GERD (gastroesophageal reflux disease)   . Herniated disc, cervical   . Left wrist fracture   . Stroke ([place]) 1998  . SVT (supraventricular tachycardia) (HCC)     Past Surgical History:  Procedure Laterality Date  . BACK SURGERY  2017   L2-4 laminectomy and foraminal stenosis  . BREAST CYST ASPIRATION Left 1977  . CARDIAC CATHETERIZATION  2009   [facility] in [place]  . CARDIAC ELECTROPHYSIOLOGY STUDY AND ABLATION  2009  . CATARACT EXTRACTION, BILATERAL  2010  . COLONOSCOPY WITH PROPOFOL N/A 03/21/2018   Procedure: COLONOSCOPY WITH Biopsy;  Surgeon: Lin Landsman, MD;  Location: [place];  Service: Endoscopy;  Laterality: N/A;  . derrick procedure  1977-78   bilateral   . ESOPHAGOGASTRODUODENOSCOPY  2013   gastritis; done for complaint of dysphagia  . ESOPHAGOGASTRODUODENOSCOPY (EGD) WITH PROPOFOL N/A 03/21/2018   Procedure: ESOPHAGOGASTRODUODENOSCOPY (EGD) WITH Biopsies;  Surgeon: Lin Landsman, MD;  Location: [place];  Service: Endoscopy;  Laterality: N/A;  KEEP THIS PATIENT FIRST  . lymph node  removal     neck  . MOHS SURGERY  07/2016   BCCA nose  . POLYPECTOMY N/A 03/21/2018   Procedure: POLYPECTOMY;  Surgeon: Lin Landsman, MD;  Location: [place];  Service: Endoscopy;  Laterality: N/A;  . REPLACEMENT TOTAL KNEE  2013   RT  . TOTAL SHOULDER REPLACEMENT  2012   RT     There were no vitals filed for this visit.  Subjective Assessment - 11/05/18 1111    Subjective  The patient is raspier today and having more difficulty finding her clear vocal quality.            ADULT SLP TREATMENT - 11/05/18 0001      General Information   Behavior/Cognition  Alert;Cooperative;Pleasant mood    HPI  73 year old woman referred for speech therapy for dysphonia.  Abnormal laryngeal findings include erythema, cobblestoning, edema, and bilateral muscle tension dysphonia with touching of the false vocal folds with phonation.         Treatment Provided   Treatment provided  Cognitive-Linquistic      Pain Assessment   Pain Assessment  No/denies pain      Cognitive-Linquistic Treatment   Treatment focused on  Voice    Skilled Treatment  The patient was provided with written and verbal teaching regarding vocal hygiene; she is reducing frequency of throat clearing.  The patient was provided with written  and verbal teaching regarding neck, shoulder, tongue, and throat stretches exercises to promote relaxed phonation. The patient was provided with written and verbal teaching regarding breath support exercises.  The patient was provided with verbal and written instruction in semi-occluded vocal tract exercises: "WOO" and "WOO" contours of loudness.  The patient was provided with written and verbal teaching regarding straw phonation exercises.  The patient is able to maintain relaxed phonation with 80 to 90% accuracy.  Patient able to maintain relaxed phonation for intonation pattern exercise, resonant voice words, and phrases with 60% accuracy. She is able to sense and hear tense  phonation and repair inconsistently.         Assessment / Recommendations / Plan   Plan  Continue with current plan of care      Progression Toward Goals   Progression toward goals  Progressing toward goals       SLP Education - 11/05/18 1112    Education Details  Straw phonation    Person(s) Educated  Patient    Methods  Explanation;Demonstration;Verbal cues;Handout    Comprehension  Verbalized understanding;Returned demonstration         SLP Long Term Goals - 10/24/18 1504      SLP LONG TERM GOAL #1   Title  The patient will demonstrate independent understanding of vocal hygiene concepts and extrinsic laryngeal muscle stretches.      Time  8    Period  Weeks    Status  New    Target Date  12/24/18      SLP LONG TERM GOAL #2   Title  The patient will be independent for abdominal breathing and breath support exercises.    Time  8    Period  Weeks    Status  New    Target Date  12/24/18      SLP LONG TERM GOAL #3   Title  The patient will minimize vocal tension via resonant voice therapy (or comparable technique) with min SLP cues with 80% accuracy.    Time  4    Period  Weeks    Status  New    Target Date  11/24/18      SLP LONG TERM GOAL #4   Title  The patient will maintain relaxed phonation / oral resonance for paragraph length recitation with 80% accuracy.    Time  8    Period  Weeks    Status  New    Target Date  12/24/18       Plan - 11/05/18 1112    Clinical Impression Statement  Patient able to improve vocal quality with semi-occluded vocal tract to improve oral resonance and decrease laryngeal strain.  She is able to sense and hear tense phonation and inconsistently repair.     Speech Therapy Frequency  2x / week    Duration  Other (comment)    Treatment/Interventions  Patient/family education;Other (comment)   Voice therapy   Potential to Achieve Goals  Good    Potential Considerations  Ability to learn/carryover information;Co-morbidities;Severity  of impairments;Cooperation/participation level;Medical prognosis;Family/community support    SLP Home Exercise Plan  Provided    Consulted and Agree with Plan of Care  Patient       Patient will benefit from skilled therapeutic intervention in order to improve the following deficits and impairments:   Dysphonia    Problem List Patient Active Problem List   Diagnosis Date Noted  . Colon cancer screening   . Arm pain, right 11/08/2017  .  Renal insufficiency 11/08/2017  . Tendinitis of right knee 07/03/2017  . Lumbar radiculopathy 05/18/2017  . Status post hemilaminotomy 05/18/2017  . Lumbar degenerative disc disease 05/18/2017  . Spinal stenosis of lumbar region without neurogenic claudication 05/18/2017  . Atrial tachycardia ([place]) 04/14/2017  . SVT (supraventricular tachycardia) ([place]) 04/14/2017  . Essential hypertension 04/14/2017  . Reactive airway disease 04/14/2017  . Osteoporosis 03/21/2017  . Benign essential tremor 03/21/2017  . Obesity 09/14/2011  . Hyperlipidemia 05/12/2003  . Personal history of transient ischemic attack (TIA), and cerebral infarction without residual deficits 04/22/2003    Leroy Sea, MS/CCC- SLP  Lou Miner 11/05/2018, 11:14 AM  [place] MAIN [facility] SERVICES [address] [place], [place], [address] Phone: [phone]   Fax:  [phone]   Name: Orian Amberg MRN: [id number] Date of Birth: [birth year]

## 2018-11-07 ENCOUNTER — Ambulatory Visit: Payer: Medicare Other | Admitting: Speech Pathology

## 2018-11-07 ENCOUNTER — Other Ambulatory Visit: Payer: Self-pay | Admitting: Internal Medicine

## 2018-11-07 ENCOUNTER — Encounter: Payer: Self-pay | Admitting: Speech Pathology

## 2018-11-07 ENCOUNTER — Other Ambulatory Visit: Payer: Self-pay

## 2018-11-07 DIAGNOSIS — R490 Dysphonia: Secondary | ICD-10-CM

## 2018-11-07 DIAGNOSIS — R2689 Other abnormalities of gait and mobility: Secondary | ICD-10-CM | POA: Diagnosis not present

## 2018-11-07 DIAGNOSIS — Z1231 Encounter for screening mammogram for malignant neoplasm of breast: Secondary | ICD-10-CM

## 2018-11-07 DIAGNOSIS — R2681 Unsteadiness on feet: Secondary | ICD-10-CM | POA: Diagnosis not present

## 2018-11-07 NOTE — Therapy (Signed)
[place] MAIN [facility] SERVICES [address] [place], [place], [address] Phone: [phone]   Fax:  [phone]  Speech Language Pathology Treatment  Patient Details  Name: Audrey Martinez MRN: [id number] Date of Birth: [birth year] Referring Provider (SLP): Dr. Pryor Ochoa   Encounter Date: 11/07/2018  End of Session - 11/07/18 1103    Visit Number  5    Number of Visits  17    Date for SLP Re-Evaluation  12/24/18    Authorization Type  Medicare    Authorization Time Period  Start 10/26/2018    Authorization - Visit Number  5    Authorization - Number of Visits  10    SLP Start Time  1000    SLP Stop Time   1050    SLP Time Calculation (min)  50 min    Activity Tolerance  Patient tolerated treatment well       Past Medical History:  Diagnosis Date  . Arthritis   . Asthma   . GERD (gastroesophageal reflux disease)   . Herniated disc, cervical   . Left wrist fracture   . Stroke ([place]) 1998  . SVT (supraventricular tachycardia) (HCC)     Past Surgical History:  Procedure Laterality Date  . BACK SURGERY  2017   L2-4 laminectomy and foraminal stenosis  . BREAST CYST ASPIRATION Left 1977  . CARDIAC CATHETERIZATION  2009   [facility] in [place]  . CARDIAC ELECTROPHYSIOLOGY STUDY AND ABLATION  2009  . CATARACT EXTRACTION, BILATERAL  2010  . COLONOSCOPY WITH PROPOFOL N/A 03/21/2018   Procedure: COLONOSCOPY WITH Biopsy;  Surgeon: Lin Landsman, MD;  Location: [place];  Service: Endoscopy;  Laterality: N/A;  . derrick procedure  1977-78   bilateral   . ESOPHAGOGASTRODUODENOSCOPY  2013   gastritis; done for complaint of dysphagia  . ESOPHAGOGASTRODUODENOSCOPY (EGD) WITH PROPOFOL N/A 03/21/2018   Procedure: ESOPHAGOGASTRODUODENOSCOPY (EGD) WITH Biopsies;  Surgeon: Lin Landsman, MD;  Location: [place];  Service: Endoscopy;  Laterality: N/A;  KEEP THIS PATIENT FIRST  . lymph node  removal     neck  . MOHS SURGERY  07/2016   BCCA nose  . POLYPECTOMY N/A 03/21/2018   Procedure: POLYPECTOMY;  Surgeon: Lin Landsman, MD;  Location: [place];  Service: Endoscopy;  Laterality: N/A;  . REPLACEMENT TOTAL KNEE  2013   RT  . TOTAL SHOULDER REPLACEMENT  2012   RT     There were no vitals filed for this visit.  Subjective Assessment - 11/07/18 1102    Subjective  Patient became light-headed during sustained frictative exercise            ADULT SLP TREATMENT - 11/07/18 0001      General Information   Behavior/Cognition  Alert;Cooperative;Pleasant mood    HPI  73 year old woman referred for speech therapy for dysphonia.  Abnormal laryngeal findings include erythema, cobblestoning, edema, and bilateral muscle tension dysphonia with touching of the false vocal folds with phonation.         Treatment Provided   Treatment provided  Cognitive-Linquistic      Pain Assessment   Pain Assessment  No/denies pain      Cognitive-Linquistic Treatment   Treatment focused on  Voice    Skilled Treatment  The patient was provided with written and verbal teaching regarding vocal hygiene; she has significantly reduced the frequency of throat clearing.  The patient was provided with written and verbal teaching regarding neck,  shoulder, tongue, and throat stretches exercises to promote relaxed phonation. The patient was provided with written and verbal teaching regarding breath support exercises.  The patient was provided with verbal and written instruction in semi-occluded vocal tract exercises: "WOO" and "WOO" contours of loudness.  The patient was provided with written and verbal teaching regarding straw phonation exercises.  The patient is able to maintain relaxed phonation with 80 to 90% accuracy.  Patient able to maintain relaxed phonation for intonation pattern exercise, semi-occluded vocal tract words, resonant voice words, and phrases with 60% accuracy. She is  able to sense and hear tense phonation and repair inconsistently.          Assessment / Recommendations / Plan   Plan  Continue with current plan of care      Progression Toward Goals   Progression toward goals  Progressing toward goals       SLP Education - 11/07/18 1103    Education Details  semi-occluded vocal tract exercises    Person(s) Educated  Patient    Methods  Explanation    Comprehension  Verbalized understanding         SLP Long Term Goals - 10/24/18 1504      SLP LONG TERM GOAL #1   Title  The patient will demonstrate independent understanding of vocal hygiene concepts and extrinsic laryngeal muscle stretches.      Time  8    Period  Weeks    Status  New    Target Date  12/24/18      SLP LONG TERM GOAL #2   Title  The patient will be independent for abdominal breathing and breath support exercises.    Time  8    Period  Weeks    Status  New    Target Date  12/24/18      SLP LONG TERM GOAL #3   Title  The patient will minimize vocal tension via resonant voice therapy (or comparable technique) with min SLP cues with 80% accuracy.    Time  4    Period  Weeks    Status  New    Target Date  11/24/18      SLP LONG TERM GOAL #4   Title  The patient will maintain relaxed phonation / oral resonance for paragraph length recitation with 80% accuracy.    Time  8    Period  Weeks    Status  New    Target Date  12/24/18       Plan - 11/07/18 1104    Clinical Impression Statement  Patient able to improve vocal quality with semi-occluded vocal tract to improve oral resonance and decrease laryngeal strain.  She is able to sense and hear tense phonation and inconsistently repair.     Speech Therapy Frequency  2x / week    Duration  Other (comment)    Treatment/Interventions  Patient/family education;Other (comment)   Voice therapy   Potential to Achieve Goals  Good    Potential Considerations  Ability to learn/carryover information;Co-morbidities;Severity of  impairments;Cooperation/participation level;Medical prognosis;Family/community support    SLP Home Exercise Plan  Provided    Consulted and Agree with Plan of Care  Patient       Patient will benefit from skilled therapeutic intervention in order to improve the following deficits and impairments:   Dysphonia    Problem List Patient Active Problem List   Diagnosis Date Noted  . Colon cancer screening   . Arm pain, right 11/08/2017  .  Renal insufficiency 11/08/2017  . Tendinitis of right knee 07/03/2017  . Lumbar radiculopathy 05/18/2017  . Status post hemilaminotomy 05/18/2017  . Lumbar degenerative disc disease 05/18/2017  . Spinal stenosis of lumbar region without neurogenic claudication 05/18/2017  . Atrial tachycardia ([place]) 04/14/2017  . SVT (supraventricular tachycardia) ([place]) 04/14/2017  . Essential hypertension 04/14/2017  . Reactive airway disease 04/14/2017  . Osteoporosis 03/21/2017  . Benign essential tremor 03/21/2017  . Obesity 09/14/2011  . Hyperlipidemia 05/12/2003  . Personal history of transient ischemic attack (TIA), and cerebral infarction without residual deficits 04/22/2003   Leroy Sea, MS/CCC- SLP  Lou Miner 11/07/2018, 11:05 AM  [place] MAIN [facility] SERVICES [address] [place], [place], [address] Phone: [phone]   Fax:  [phone]   Name: Latoiya Maradiaga MRN: [id number] Date of Birth: 1945-06-29

## 2018-11-12 ENCOUNTER — Ambulatory Visit: Payer: Medicare Other | Admitting: Speech Pathology

## 2018-11-12 ENCOUNTER — Other Ambulatory Visit: Payer: Self-pay

## 2018-11-12 ENCOUNTER — Encounter: Payer: Self-pay | Admitting: Speech Pathology

## 2018-11-12 DIAGNOSIS — R2689 Other abnormalities of gait and mobility: Secondary | ICD-10-CM | POA: Diagnosis not present

## 2018-11-12 DIAGNOSIS — R2681 Unsteadiness on feet: Secondary | ICD-10-CM | POA: Diagnosis not present

## 2018-11-12 DIAGNOSIS — R490 Dysphonia: Secondary | ICD-10-CM | POA: Diagnosis not present

## 2018-11-12 NOTE — Therapy (Signed)
[place] MAIN [facility] SERVICES [address] [place], [place], [address] Phone: [phone]   Fax:  [phone]  Speech Language Pathology Treatment  Patient Details  Name: Audrey Martinez MRN: [id number] Date of Birth: [birth year] Referring Provider (SLP): Dr. Pryor Ochoa   Encounter Date: 11/12/2018  End of Session - 11/12/18 1058    Visit Number  6    Number of Visits  17    Date for SLP Re-Evaluation  12/24/18    Authorization Type  Medicare    Authorization Time Period  Start 10/26/2018    Authorization - Visit Number  6    Authorization - Number of Visits  10    SLP Start Time  1000    SLP Stop Time   1050    SLP Time Calculation (min)  50 min    Activity Tolerance  Patient tolerated treatment well       Past Medical History:  Diagnosis Date  . Arthritis   . Asthma   . GERD (gastroesophageal reflux disease)   . Herniated disc, cervical   . Left wrist fracture   . Stroke ([place]) 1998  . SVT (supraventricular tachycardia) (HCC)     Past Surgical History:  Procedure Laterality Date  . BACK SURGERY  2017   L2-4 laminectomy and foraminal stenosis  . BREAST CYST ASPIRATION Left 1977  . CARDIAC CATHETERIZATION  2009   [facility] in [place]  . CARDIAC ELECTROPHYSIOLOGY STUDY AND ABLATION  2009  . CATARACT EXTRACTION, BILATERAL  2010  . COLONOSCOPY WITH PROPOFOL N/A 03/21/2018   Procedure: COLONOSCOPY WITH Biopsy;  Surgeon: Lin Landsman, MD;  Location: [place];  Service: Endoscopy;  Laterality: N/A;  . derrick procedure  1977-78   bilateral   . ESOPHAGOGASTRODUODENOSCOPY  2013   gastritis; done for complaint of dysphagia  . ESOPHAGOGASTRODUODENOSCOPY (EGD) WITH PROPOFOL N/A 03/21/2018   Procedure: ESOPHAGOGASTRODUODENOSCOPY (EGD) WITH Biopsies;  Surgeon: Lin Landsman, MD;  Location: [place];  Service: Endoscopy;  Laterality: N/A;  KEEP THIS PATIENT FIRST  . lymph node  removal     neck  . MOHS SURGERY  07/2016   BCCA nose  . POLYPECTOMY N/A 03/21/2018   Procedure: POLYPECTOMY;  Surgeon: Lin Landsman, MD;  Location: [place];  Service: Endoscopy;  Laterality: N/A;  . REPLACEMENT TOTAL KNEE  2013   RT  . TOTAL SHOULDER REPLACEMENT  2012   RT     There were no vitals filed for this visit.         ADULT SLP TREATMENT - 11/12/18 0001      General Information   Behavior/Cognition  Alert;Cooperative;Pleasant mood    HPI  73 year old woman referred for speech therapy for dysphonia.  Abnormal laryngeal findings include erythema, cobblestoning, edema, and bilateral muscle tension dysphonia with touching of the false vocal folds with phonation.         Treatment Provided   Treatment provided  Cognitive-Linquistic      Pain Assessment   Pain Assessment  No/denies pain      Cognitive-Linquistic Treatment   Treatment focused on  Voice    Skilled Treatment  The patient was provided with written and verbal teaching regarding vocal hygiene.  The patient was provided with written and verbal teaching regarding neck, shoulder, tongue, and throat stretches exercises to promote relaxed phonation. The patient was provided with written and verbal teaching regarding breath support exercises.  The patient was provided with verbal  and written instruction in semi-occluded vocal tract exercises: "WOO" and "WOO" contours of loudness.  The patient is able to maintain relaxed phonation with 80 to 90% accuracy.  Patient able to maintain relaxed phonation for generating phrases/sentences with 60% accuracy. She is able to sense and hear tense phonation and repair with greater frequency.         Assessment / Recommendations / Plan   Plan  Continue with current plan of care      Progression Toward Goals   Progression toward goals  Progressing toward goals       SLP Education - 11/12/18 1057    Education Details  Recommendations for practice frequency     Person(s) Educated  Patient    Methods  Explanation    Comprehension  Verbalized understanding         SLP Long Term Goals - 10/24/18 1504      SLP LONG TERM GOAL #1   Title  The patient will demonstrate independent understanding of vocal hygiene concepts and extrinsic laryngeal muscle stretches.      Time  8    Period  Weeks    Status  New    Target Date  12/24/18      SLP LONG TERM GOAL #2   Title  The patient will be independent for abdominal breathing and breath support exercises.    Time  8    Period  Weeks    Status  New    Target Date  12/24/18      SLP LONG TERM GOAL #3   Title  The patient will minimize vocal tension via resonant voice therapy (or comparable technique) with min SLP cues with 80% accuracy.    Time  4    Period  Weeks    Status  New    Target Date  11/24/18      SLP LONG TERM GOAL #4   Title  The patient will maintain relaxed phonation / oral resonance for paragraph length recitation with 80% accuracy.    Time  8    Period  Weeks    Status  New    Target Date  12/24/18       Plan - 11/12/18 1058    Clinical Impression Statement  Patient able to improve vocal quality with semi-occluded vocal tract to improve oral resonance and decrease laryngeal strain.  She is able to sense and hear tense phonation and inconsistently repair.  Her overall vocal quality is improved.    Speech Therapy Frequency  2x / week    Duration  Other (comment)    Treatment/Interventions  Patient/family education;Other (comment)    Potential to Achieve Goals  Good    Potential Considerations  Ability to learn/carryover information;Co-morbidities;Severity of impairments;Cooperation/participation level;Medical prognosis;Family/community support    SLP Home Exercise Plan  Provided    Consulted and Agree with Plan of Care  Patient       Patient will benefit from skilled therapeutic intervention in order to improve the following deficits and impairments:    Dysphonia    Problem List Patient Active Problem List   Diagnosis Date Noted  . Colon cancer screening   . Arm pain, right 11/08/2017  . Renal insufficiency 11/08/2017  . Tendinitis of right knee 07/03/2017  . Lumbar radiculopathy 05/18/2017  . Status post hemilaminotomy 05/18/2017  . Lumbar degenerative disc disease 05/18/2017  . Spinal stenosis of lumbar region without neurogenic claudication 05/18/2017  . Atrial tachycardia ([place]) 04/14/2017  . SVT (  supraventricular tachycardia) ([place]) 04/14/2017  . Essential hypertension 04/14/2017  . Reactive airway disease 04/14/2017  . Osteoporosis 03/21/2017  . Benign essential tremor 03/21/2017  . Obesity 09/14/2011  . Hyperlipidemia 05/12/2003  . Personal history of transient ischemic attack (TIA), and cerebral infarction without residual deficits 04/22/2003   Leroy Sea, MS/CCC- SLP  Lou [place] 11/12/2018, 11:01 AM  [place] MAIN [facility] SERVICES [place], [place], [address] Phone: [phone]   Fax:  [phone]   Name: Audrey Martinez MRN: [id number] Date of Birth: 1946-01-29

## 2018-11-14 ENCOUNTER — Encounter: Payer: Self-pay | Admitting: Speech Pathology

## 2018-11-14 ENCOUNTER — Ambulatory Visit: Payer: Medicare Other | Admitting: Speech Pathology

## 2018-11-14 ENCOUNTER — Other Ambulatory Visit: Payer: Self-pay

## 2018-11-14 DIAGNOSIS — R2681 Unsteadiness on feet: Secondary | ICD-10-CM | POA: Diagnosis not present

## 2018-11-14 DIAGNOSIS — R2689 Other abnormalities of gait and mobility: Secondary | ICD-10-CM | POA: Diagnosis not present

## 2018-11-14 DIAGNOSIS — R490 Dysphonia: Secondary | ICD-10-CM

## 2018-11-14 NOTE — Therapy (Signed)
[place] MAIN [facility] SERVICES [address] [place], [place], [address] Phone: [phone]   Fax:  [phone]  Speech Language Pathology Treatment  Patient Details  Name: Hasina Kreager MRN: [id number] Date of Birth: 1945-11-11 Referring Provider (SLP): Dr. Pryor Ochoa   Encounter Date: 11/14/2018  End of Session - 11/14/18 1133    Visit Number  7    Number of Visits  17    Date for SLP Re-Evaluation  12/24/18    Authorization Type  Medicare    Authorization Time Period  Start 10/26/2018    Authorization - Visit Number  7    Authorization - Number of Visits  10    SLP Start Time  1000    SLP Stop Time   1050    SLP Time Calculation (min)  50 min    Activity Tolerance  Patient tolerated treatment well       Past Medical History:  Diagnosis Date  . Arthritis   . Asthma   . GERD (gastroesophageal reflux disease)   . Herniated disc, cervical   . Left wrist fracture   . Stroke ([place]) 1998  . SVT (supraventricular tachycardia) (HCC)     Past Surgical History:  Procedure Laterality Date  . BACK SURGERY  2017   L2-4 laminectomy and foraminal stenosis  . BREAST CYST ASPIRATION Left 1977  . CARDIAC CATHETERIZATION  2009   [facility] in [place]  . CARDIAC ELECTROPHYSIOLOGY STUDY AND ABLATION  2009  . CATARACT EXTRACTION, BILATERAL  2010  . COLONOSCOPY WITH PROPOFOL N/A 03/21/2018   Procedure: COLONOSCOPY WITH Biopsy;  Surgeon: Lin Landsman, MD;  Location: [place];  Service: Endoscopy;  Laterality: N/A;  . derrick procedure  1977-78   bilateral   . ESOPHAGOGASTRODUODENOSCOPY  2013   gastritis; done for complaint of dysphagia  . ESOPHAGOGASTRODUODENOSCOPY (EGD) WITH PROPOFOL N/A 03/21/2018   Procedure: ESOPHAGOGASTRODUODENOSCOPY (EGD) WITH Biopsies;  Surgeon: Lin Landsman, MD;  Location: [place];  Service: Endoscopy;  Laterality: N/A;  KEEP THIS PATIENT FIRST  . lymph node  removal     neck  . MOHS SURGERY  07/2016   BCCA nose  . POLYPECTOMY N/A 03/21/2018   Procedure: POLYPECTOMY;  Surgeon: Lin Landsman, MD;  Location: [place];  Service: Endoscopy;  Laterality: N/A;  . REPLACEMENT TOTAL KNEE  2013   RT  . TOTAL SHOULDER REPLACEMENT  2012   RT     There were no vitals filed for this visit.  Subjective Assessment - 11/14/18 1132    Subjective  Patient is frustrated by her inconsistent control of her voice            ADULT SLP TREATMENT - 11/14/18 0001      General Information   Behavior/Cognition  Alert;Cooperative;Pleasant mood    HPI  73 year old woman referred for speech therapy for dysphonia.  Abnormal laryngeal findings include erythema, cobblestoning, edema, and bilateral muscle tension dysphonia with touching of the false vocal folds with phonation.         Treatment Provided   Treatment provided  Cognitive-Linquistic      Pain Assessment   Pain Assessment  No/denies pain      Cognitive-Linquistic Treatment   Treatment focused on  Voice    Skilled Treatment  The patient was provided with written and verbal teaching regarding vocal hygiene.  The patient was provided with written and verbal teaching regarding neck, shoulder, tongue, and throat stretches exercises  to promote relaxed phonation. The patient was provided with written and verbal teaching regarding breath support exercises.  The patient was provided with verbal and written instruction in semi-occluded vocal tract exercises: "WOO" and "WOO" contours of loudness.  The patient is able to maintain relaxed phonation with 80 to 90% accuracy.  Patient able to maintain relaxed phonation for generating phrases/sentences with 60% accuracy. She is able to sense and hear tense phonation and repair with greater frequency.         Assessment / Recommendations / Plan   Plan  Continue with current plan of care      Progression Toward Goals   Progression toward goals   Progressing toward goals       SLP Education - 11/14/18 1132    Education Details  Be patient, you have made progress and will continue to do so    Person(s) Educated  Patient    Methods  Explanation    Comprehension  Verbalized understanding         SLP Long Term Goals - 10/24/18 1504      SLP LONG TERM GOAL #1   Title  The patient will demonstrate independent understanding of vocal hygiene concepts and extrinsic laryngeal muscle stretches.      Time  8    Period  Weeks    Status  New    Target Date  12/24/18      SLP LONG TERM GOAL #2   Title  The patient will be independent for abdominal breathing and breath support exercises.    Time  8    Period  Weeks    Status  New    Target Date  12/24/18      SLP LONG TERM GOAL #3   Title  The patient will minimize vocal tension via resonant voice therapy (or comparable technique) with min SLP cues with 80% accuracy.    Time  4    Period  Weeks    Status  New    Target Date  11/24/18      SLP LONG TERM GOAL #4   Title  The patient will maintain relaxed phonation / oral resonance for paragraph length recitation with 80% accuracy.    Time  8    Period  Weeks    Status  New    Target Date  12/24/18       Plan - 11/14/18 1134    Clinical Impression Statement  Patient able to improve vocal quality with semi-occluded vocal tract to improve oral resonance and decrease laryngeal strain.  She is able to sense and hear tense phonation and inconsistently repair.  Her overall vocal quality is improved.    Speech Therapy Frequency  2x / week    Duration  Other (comment)    Treatment/Interventions  Patient/family education;Other (comment)   Voice therapy   Potential to Achieve Goals  Good    Potential Considerations  Ability to learn/carryover information;Co-morbidities;Severity of impairments;Cooperation/participation level;Medical prognosis;Family/community support    SLP Home Exercise Plan  Provided    Consulted and Agree with  Plan of Care  Patient       Patient will benefit from skilled therapeutic intervention in order to improve the following deficits and impairments:   1. Dysphonia       Problem List Patient Active Problem List   Diagnosis Date Noted  . Colon cancer screening   . Arm pain, right 11/08/2017  . Renal insufficiency 11/08/2017  . Tendinitis of right knee  07/03/2017  . Lumbar radiculopathy 05/18/2017  . Status post hemilaminotomy 05/18/2017  . Lumbar degenerative disc disease 05/18/2017  . Spinal stenosis of lumbar region without neurogenic claudication 05/18/2017  . Atrial tachycardia ([place]) 04/14/2017  . SVT (supraventricular tachycardia) ([place]) 04/14/2017  . Essential hypertension 04/14/2017  . Reactive airway disease 04/14/2017  . Osteoporosis 03/21/2017  . Benign essential tremor 03/21/2017  . Obesity 09/14/2011  . Hyperlipidemia 05/12/2003  . Personal history of transient ischemic attack (TIA), and cerebral infarction without residual deficits 04/22/2003   Leroy Sea, MS/CCC- SLP  Lou Miner 11/14/2018, 11:35 AM  [place] MAIN [facility] SERVICES [place], [place], [address] Phone: [phone]   Fax:  [phone]   Name: Tejasvi Brissett MRN: [id number] Date of Birth: 1946/01/14

## 2018-11-15 ENCOUNTER — Other Ambulatory Visit: Payer: Self-pay

## 2018-11-15 ENCOUNTER — Encounter: Payer: Self-pay | Admitting: Internal Medicine

## 2018-11-15 ENCOUNTER — Ambulatory Visit (INDEPENDENT_AMBULATORY_CARE_PROVIDER_SITE_OTHER): Payer: Medicare Other | Admitting: Internal Medicine

## 2018-11-15 ENCOUNTER — Other Ambulatory Visit
Admission: RE | Admit: 2018-11-15 | Discharge: 2018-11-15 | Disposition: A | Discharge status: Home / Self Care | Payer: Medicare Other | Attending: Internal Medicine | Admitting: Internal Medicine

## 2018-11-15 VITALS — BP 124/82 | HR 98 | Ht 67.0 in | Wt 219.0 lb

## 2018-11-15 DIAGNOSIS — M25511 Pain in right shoulder: Secondary | ICD-10-CM

## 2018-11-15 DIAGNOSIS — E782 Mixed hyperlipidemia: Secondary | ICD-10-CM | POA: Insufficient documentation

## 2018-11-15 DIAGNOSIS — Z1159 Encounter for screening for other viral diseases: Secondary | ICD-10-CM | POA: Diagnosis not present

## 2018-11-15 DIAGNOSIS — Z1231 Encounter for screening mammogram for malignant neoplasm of breast: Secondary | ICD-10-CM | POA: Diagnosis not present

## 2018-11-15 DIAGNOSIS — I10 Essential (primary) hypertension: Secondary | ICD-10-CM

## 2018-11-15 DIAGNOSIS — J4520 Mild intermittent asthma, uncomplicated: Secondary | ICD-10-CM | POA: Diagnosis not present

## 2018-11-15 DIAGNOSIS — M5136 Other intervertebral disc degeneration, lumbar region: Secondary | ICD-10-CM | POA: Diagnosis not present

## 2018-11-15 DIAGNOSIS — I471 Supraventricular tachycardia: Secondary | ICD-10-CM

## 2018-11-15 DIAGNOSIS — G8929 Other chronic pain: Secondary | ICD-10-CM

## 2018-11-15 DIAGNOSIS — R2681 Unsteadiness on feet: Secondary | ICD-10-CM

## 2018-11-15 LAB — CBC WITH DIFFERENTIAL/PLATELET
Abs Immature Granulocytes: 0.03 10*3/uL (ref 0.00–0.07)
Basophils Absolute: 0 10*3/uL (ref 0.0–0.1)
Basophils Relative: 0 %
Eosinophils Absolute: 0.1 10*3/uL (ref 0.0–0.5)
Eosinophils Relative: 1 %
HCT: 43.9 % (ref 36.0–46.0)
Hemoglobin: 14.5 g/dL (ref 12.0–15.0)
Immature Granulocytes: 0 %
Lymphocytes Relative: 17 %
Lymphs Abs: 1.4 10*3/uL (ref 0.7–4.0)
MCH: 31.5 pg (ref 26.0–34.0)
MCHC: 33 g/dL (ref 30.0–36.0)
MCV: 95.4 fL (ref 80.0–100.0)
Monocytes Absolute: 0.7 10*3/uL (ref 0.1–1.0)
Monocytes Relative: 8 %
Neutro Abs: 5.8 10*3/uL (ref 1.7–7.7)
Neutrophils Relative %: 74 %
Platelets: 210 10*3/uL (ref 150–400)
RBC: 4.6 MIL/uL (ref 3.87–5.11)
RDW: 12.3 % (ref 11.5–15.5)
WBC: 8 10*3/uL (ref 4.0–10.5)
nRBC: 0 % (ref 0.0–0.2)

## 2018-11-15 LAB — POCT URINALYSIS DIPSTICK
Bilirubin, UA: NEGATIVE
Blood, UA: NEGATIVE
Glucose, UA: NEGATIVE
Ketones, UA: NEGATIVE
Leukocytes, UA: NEGATIVE
Nitrite, UA: NEGATIVE
Protein, UA: NEGATIVE
Spec Grav, UA: 1.01 (ref 1.010–1.025)
Urobilinogen, UA: 0.2 E.U./dL
pH, UA: 7 (ref 5.0–8.0)

## 2018-11-15 LAB — COMPREHENSIVE METABOLIC PANEL
ALT: 19 U/L (ref 0–44)
AST: 21 U/L (ref 15–41)
Albumin: 4.1 g/dL (ref 3.5–5.0)
Alkaline Phosphatase: 74 U/L (ref 38–126)
Anion gap: 8 (ref 5–15)
BUN: 14 mg/dL (ref 8–23)
CO2: 24 mmol/L (ref 22–32)
Calcium: 9 mg/dL (ref 8.9–10.3)
Chloride: 104 mmol/L (ref 98–111)
Creatinine, Ser: 0.97 mg/dL (ref 0.44–1.00)
GFR calc Af Amer: >60 mL/min (ref >60)
GFR calc non Af Amer: 59 mL/min — ABNORMAL LOW (ref >60)
Glucose, Bld: 98 mg/dL (ref 70–99)
Potassium: 3.9 mmol/L (ref 3.5–5.1)
Sodium: 136 mmol/L (ref 135–145)
Total Bilirubin: 1 mg/dL (ref 0.3–1.2)
Total Protein: 6.6 g/dL (ref 6.5–8.1)

## 2018-11-15 LAB — LIPID PANEL
Cholesterol: 218 mg/dL — ABNORMAL HIGH (ref 0–200)
HDL: 63 mg/dL (ref >40)
LDL Cholesterol: 131 mg/dL — ABNORMAL HIGH (ref 0–99)
Total CHOL/HDL Ratio: 3.5 RATIO
Triglycerides: 120 mg/dL (ref <150)
VLDL: 24 mg/dL (ref 0–40)

## 2018-11-15 LAB — TSH: TSH: 1.53 u[IU]/mL (ref 0.350–4.500)

## 2018-11-15 NOTE — Progress Notes (Signed)
Date:  11/15/2018   Name:  Audrey Martinez   DOB:  11-Jan-1946   MRN:  [id number]   Chief Complaint: Hypertension, Asthma, and Breast Exam Audrey Martinez is a 73 y.o. female who presents today for annual check up.  She feels fairly well. She reports exercising walking. She reports she is sleeping well. She denies breast issues.  She is having problems with balance but has not fallen.  Her right knee limits her.  Mammogram 11/2017 Colonoscopy 02/2018 Pneumonia vaccines complete DEXA 2016  Hypertension This is a chronic problem. The problem is controlled. Pertinent negatives include no chest pain, headaches, palpitations or shortness of breath. Past treatments include calcium channel blockers. The current treatment provides significant improvement.  Back Pain This is a chronic problem. The problem is unchanged. Associated symptoms include tingling (down to wrist) and weakness. Pertinent negatives include no abdominal pain, chest pain, dysuria, fever or headaches. (Gait instability but no falls) Treatments tried: gabapentin and baclofen.  Asthma She complains of hoarse voice. There is no cough, shortness of breath or wheezing. This is a recurrent problem. The problem occurs intermittently. Associated symptoms include postnasal drip and a sore throat. Pertinent negatives include no chest pain, fever, headaches or myalgias. Her symptoms are alleviated by leukotriene antagonist, steroid inhaler and beta-agonist. She reports significant improvement on treatment. Her past medical history is significant for asthma.  Shoulder Pain  The pain is present in the right shoulder. This is a chronic (started years ago after a fall, had surgery but never regained back full use.  Had has chronic tingling to the wrist and recently worsening ROM and some hand weakness at time.) problem. There has been a history of trauma. The quality of the pain is described as aching. Associated symptoms include a limited  range of motion and tingling (down to wrist). Pertinent negatives include no fever. The symptoms are aggravated by activity.  Hoarseness - being treated by ST, working to relax more when she talks.   SVT - has PRN cardiazem to take but has not needed it.   Review of Systems  Constitutional: Negative for chills, fatigue and fever.  HENT: Positive for congestion, hoarse voice, postnasal drip, sore throat and voice change.   Eyes: Negative for visual disturbance.  Respiratory: Negative for cough, choking, shortness of breath and wheezing.   Cardiovascular: Negative for chest pain, palpitations and leg swelling.  Gastrointestinal: Negative for abdominal pain and constipation.  Genitourinary: Negative for dysuria.  Musculoskeletal: Positive for arthralgias and back pain. Negative for joint swelling and myalgias.  Skin: Negative for color change and rash.  Allergic/Immunologic: Positive for environmental allergies.  Neurological: Positive for tingling (down to wrist), weakness and light-headedness. Negative for dizziness, tremors and headaches.  Hematological: Negative for adenopathy.  Psychiatric/Behavioral: Negative for dysphoric mood and sleep disturbance. The patient is not nervous/anxious.     Patient Active Problem List   Diagnosis Date Noted   Colon cancer screening    Arm pain, right 11/08/2017   Renal insufficiency 11/08/2017   Tendinitis of right knee 07/03/2017   Lumbar radiculopathy 05/18/2017   Status post hemilaminotomy 05/18/2017   Lumbar degenerative disc disease 05/18/2017   Spinal stenosis of lumbar region without neurogenic claudication 05/18/2017   Atrial tachycardia ([place]) 04/14/2017   SVT (supraventricular tachycardia) ([place]) 04/14/2017   Essential hypertension 04/14/2017   Intermittent asthma without complication 97/06/6376   Osteoporosis 03/21/2017   Benign essential tremor 03/21/2017   Obesity 09/14/2011   Hyperlipidemia 05/12/2003  Personal  history of transient ischemic attack (TIA), and cerebral infarction without residual deficits 04/22/2003    Allergies  Allergen Reactions   Aspirin Anaphylaxis   Ciprofloxacin Rash   Ciprofloxacin-Dexamethasone Anaphylaxis   Crestor [Rosuvastatin] Other (See Comments)    Caused problems with memory.   Morphine And Related Nausea Only   Amoxicillin Hives   Doxycycline Hyclate Hives   Hydrochlorothiazide Other (See Comments)    Raises her blood pressure.    Keflex [Cephalexin] Hives   Lovastatin Other (See Comments)    "Unable to function"   Simvastatin Other (See Comments)    "Unable to function"   Erythromycin Hives   Ibuprofen Other (See Comments)    Sharp stomach pains   Neomycin-Bacitracin Zn-Polymyx Hives and Rash   Omeprazole Palpitations    Tachycardia and same sensation as her SVTs per patient   Tape Hives and Rash    Past Surgical History:  Procedure Laterality Date   BACK SURGERY  2017   L2-4 laminectomy and foraminal stenosis   BREAST CYST ASPIRATION Left [place]  2009   [facility] in [place]  2009   CATARACT EXTRACTION, BILATERAL  2010   COLONOSCOPY WITH PROPOFOL N/A 03/21/2018   Procedure: COLONOSCOPY WITH Biopsy;  Surgeon: Lin Landsman, MD;  Location: [facility];  Service: Endoscopy;  Laterality: N/A;   derrick procedure  1977-78   bilateral    ESOPHAGOGASTRODUODENOSCOPY  2013   gastritis; done for complaint of dysphagia   ESOPHAGOGASTRODUODENOSCOPY (EGD) WITH PROPOFOL N/A 03/21/2018   Procedure: ESOPHAGOGASTRODUODENOSCOPY (EGD) WITH Biopsies;  Surgeon: Lin Landsman, MD;  Location: [facility];  Service: Endoscopy;  Laterality: N/A;  KEEP THIS PATIENT FIRST   lymph node removal     neck   MOHS SURGERY  07/2016   BCCA nose   POLYPECTOMY N/A 03/21/2018   Procedure: POLYPECTOMY;  Surgeon: Lin Landsman, MD;  Location: [facility];  Service: Endoscopy;  Laterality: N/A;   REPLACEMENT TOTAL KNEE  2013   RT   TOTAL SHOULDER REPLACEMENT  2012   RT     Social History   Tobacco Use   Smoking status: Never Smoker   Smokeless tobacco: Never Used  Substance Use Topics   Alcohol use: Yes    Comment: may drink 1-2x/yr   Drug use: No     Medication list has been reviewed and updated.  Current Meds  Medication Sig   albuterol (PROVENTIL HFA;VENTOLIN HFA) 108 (90 Base) MCG/ACT inhaler Inhale into the lungs every 6 (six) hours as needed for wheezing or shortness of breath.   baclofen (LIORESAL) 10 MG tablet TAKE 1 TABLET BY MOUTH THREE TIMES A DAY (Patient taking differently: 2 (two) times daily. )   calcium-vitamin D (OSCAL WITH D) 500-200 MG-UNIT tablet Take 1 tablet by mouth.   cholecalciferol (VITAMIN D) 400 units TABS tablet Take 400 Units by mouth.   Cranberry 1000 MG CAPS Take by mouth.   diltiazem (CARDIZEM CD) 180 MG 24 hr capsule TAKE 1 CAPSULE (180 MG TOTAL) DAILY BY MOUTH.   diltiazem (CARDIZEM) 60 MG tablet Take 60 mg by mouth 4 (four) times daily.   FLOVENT HFA 110 MCG/ACT inhaler TAKE 2 PUFFS BY MOUTH TWICE A DAY   gabapentin (NEURONTIN) 300 MG capsule Take 1 capsule (300 mg total) by mouth 3 (three) times daily. Three capsules daily at bedtime.   Ginger 500 MG CAPS Take by mouth.  Misc Natural Products (TART CHERRY ADVANCED PO) Take by mouth.   montelukast (SINGULAIR) 10 MG tablet Take 10 mg by mouth every evening.   Multiple Vitamins-Minerals (MULTIVITAMIN WITH MINERALS) tablet Take 1 tablet by mouth daily.   NON FORMULARY Protleotic Enzymes   Probiotic Product (PROBIOTIC DAILY PO) Take by mouth.   STRIVERDI RESPIMAT 2.5 MCG/ACT AERS INHALE 2 PUFFS BY MOUTH INTO THE LUNGS DAILY   Turmeric 500 MG CAPS Take by mouth.    PHQ 2/9 Scores 11/15/2018 11/08/2017 07/04/2017 05/18/2017  PHQ - 2 Score 0 0 0 0    BP Readings from Last 3  Encounters:  11/15/18 124/82  08/27/18 128/82  08/22/18 112/87    Physical Exam Vitals signs and nursing note reviewed.  Constitutional:      General: She is not in acute distress.    Appearance: She is well-developed.  HENT:     Head: Normocephalic and atraumatic.     Right Ear: Tympanic membrane and ear canal normal.     Left Ear: Tympanic membrane and ear canal normal.     Nose:     Right Sinus: No maxillary sinus tenderness.     Left Sinus: No maxillary sinus tenderness.     Mouth/Throat:     Pharynx: Uvula midline.     Comments: Hoarse raspy voice  Eyes:     General: No scleral icterus.       Right eye: No discharge.        Left eye: No discharge.     Conjunctiva/sclera: Conjunctivae normal.  Neck:     Musculoskeletal: Normal range of motion. No erythema.     Thyroid: No thyromegaly.     Vascular: No carotid bruit.  Cardiovascular:     Rate and Rhythm: Normal rate and regular rhythm.     Pulses: Normal pulses.     Heart sounds: Normal heart sounds.  Pulmonary:     Effort: Pulmonary effort is normal. No respiratory distress.     Breath sounds: No wheezing.  Chest:     Breasts:        Right: No mass, nipple discharge, skin change or tenderness.        Left: No mass, nipple discharge, skin change or tenderness.  Abdominal:     General: Bowel sounds are normal.     Palpations: Abdomen is soft.     Tenderness: There is no abdominal tenderness.  Musculoskeletal:     Right shoulder: She exhibits decreased range of motion and tenderness.     Lumbar back: She exhibits tenderness. She exhibits no bony tenderness and no swelling.     Right lower leg: No edema.     Left lower leg: No edema.  Lymphadenopathy:     Cervical: No cervical adenopathy.  Skin:    General: Skin is warm and dry.     Capillary Refill: Capillary refill takes less than 2 seconds.     Findings: No rash.  Neurological:     Mental Status: She is alert and oriented to person, place, and time.      Cranial Nerves: No cranial nerve deficit.     Sensory: Sensation is intact. No sensory deficit.     Motor: Motor function is intact.     Gait: Gait is intact.     Deep Tendon Reflexes: Reflexes are normal and symmetric.  Psychiatric:        Attention and Perception: Attention normal.        Mood and Affect: Mood  normal.        Speech: Speech normal.        Behavior: Behavior normal.        Thought Content: Thought content normal.     Wt Readings from Last 3 Encounters:  11/15/18 219 lb (99.3 kg)  08/27/18 221 lb (100.2 kg)  08/22/18 232 lb (105.2 kg)    BP 124/82    Pulse 98    Ht 5\' 7"  (1.702 m)    Wt 219 lb (99.3 kg)    SpO2 96%    BMI 34.30 kg/m   Assessment and Plan: 1. Essential hypertension controlled - CBC with Differential/Platelet - Comprehensive metabolic panel - TSH - POCT urinalysis dipstick  2. SVT (supraventricular tachycardia) ([place]) Not recurrent Follow up with cardiology  3. Lumbar degenerative disc disease Stable, chronic; continue gabapentin With balance issues  4. Mild intermittent asthma without complication controlled  5. Mixed hyperlipidemia Not on medication - Lipid panel  6. Encounter for screening mammogram for breast cancer To be scheduled at [facility]; Future  7. Need for hepatitis C screening test - Hepatitis C antibody  8. Gait instability - Ambulatory referral to Physical Therapy  9. Chronic right shoulder pain Recommend Ortho evaluation and rehab recommendations - Ambulatory referral to Orthopedic Surgery   Partially dictated using [place]. Any errors are unintentional.  Halina Maidens, MD [facility]  11/15/2018

## 2018-11-15 NOTE — Patient Instructions (Signed)
Health Maintenance  Topic Date Due  . Hepatitis C Screening  [birth year]  . TETANUS/TDAP  11/15/2019 (Originally 03/03/2012)  . MAMMOGRAM  12/15/2018  . INFLUENZA VACCINE  12/29/2018  . COLONOSCOPY  03/21/2021  . DEXA SCAN  Completed  . PNA vac Low Risk Adult  Completed

## 2018-11-16 LAB — HEPATITIS C ANTIBODY: HCV Ab: <0.1 s/co ratio (ref 0.0–0.9)

## 2018-11-20 ENCOUNTER — Other Ambulatory Visit: Payer: Self-pay | Admitting: Internal Medicine

## 2018-11-20 DIAGNOSIS — E782 Mixed hyperlipidemia: Secondary | ICD-10-CM

## 2018-11-20 MED ORDER — EZETIMIBE 10 MG PO TABS: 10.0000 mg | ORAL_TABLET | Freq: Every day | ORAL | 3 refills | Status: DC

## 2018-11-20 NOTE — Progress Notes (Signed)
She would like to know side effects before taking this medication? Statin meds caused her memory loss.

## 2018-11-20 NOTE — Progress Notes (Signed)
Spoke with patient. She is willing to try medication. Wants Zetia sent to [facility] [place].

## 2018-11-21 ENCOUNTER — Other Ambulatory Visit: Payer: Self-pay

## 2018-11-21 ENCOUNTER — Ambulatory Visit: Payer: Medicare Other | Admitting: Speech Pathology

## 2018-11-21 ENCOUNTER — Encounter: Payer: Self-pay | Admitting: Speech Pathology

## 2018-11-21 DIAGNOSIS — R2689 Other abnormalities of gait and mobility: Secondary | ICD-10-CM | POA: Diagnosis not present

## 2018-11-21 DIAGNOSIS — R2681 Unsteadiness on feet: Secondary | ICD-10-CM | POA: Diagnosis not present

## 2018-11-21 DIAGNOSIS — R490 Dysphonia: Secondary | ICD-10-CM | POA: Diagnosis not present

## 2018-11-21 NOTE — Therapy (Signed)
[place] MAIN [facility] SERVICES [address] [place], [place], [address] Phone: [phone]   Fax:  [phone]  Speech Language Pathology Treatment  Patient Details  Name: Audrey Martinez MRN: [id number] Date of Birth: Aug 17, 1945 Referring Provider (SLP): Dr. Pryor Ochoa   Encounter Date: 11/21/2018  End of Session - 11/21/18 1116    Visit Number  8    Number of Visits  17    Date for SLP Re-Evaluation  12/24/18    Authorization Type  Medicare    Authorization Time Period  Start 10/26/2018    Authorization - Visit Number  8    Authorization - Number of Visits  10    SLP Start Time  1000    SLP Stop Time   1050    SLP Time Calculation (min)  50 min    Activity Tolerance  Patient tolerated treatment well       Past Medical History:  Diagnosis Date  . Arthritis   . Asthma   . GERD (gastroesophageal reflux disease)   . Herniated disc, cervical   . Left wrist fracture   . Stroke ([place]) 1998  . SVT (supraventricular tachycardia) ([facility])     Past Surgical History:  Procedure Laterality Date  . BACK SURGERY  2017   L2-4 laminectomy and foraminal stenosis  . BREAST CYST ASPIRATION Left 1977  . CARDIAC CATHETERIZATION  2009   [facility] in [place]  . CARDIAC ELECTROPHYSIOLOGY STUDY AND ABLATION  2009  . CATARACT EXTRACTION, BILATERAL  2010  . COLONOSCOPY WITH PROPOFOL N/A 03/21/2018   Procedure: COLONOSCOPY WITH Biopsy;  Surgeon: Lin Landsman, MD;  Location: [facility];  Service: Endoscopy;  Laterality: N/A;  . derrick procedure  1977-78   bilateral   . ESOPHAGOGASTRODUODENOSCOPY  2013   gastritis; done for complaint of dysphagia  . ESOPHAGOGASTRODUODENOSCOPY (EGD) WITH PROPOFOL N/A 03/21/2018   Procedure: ESOPHAGOGASTRODUODENOSCOPY (EGD) WITH Biopsies;  Surgeon: Lin Landsman, MD;  Location: [facility];  Service: Endoscopy;  Laterality: N/A;  KEEP THIS PATIENT FIRST  . lymph node  removal     neck  . MOHS SURGERY  07/2016   BCCA nose  . POLYPECTOMY N/A 03/21/2018   Procedure: POLYPECTOMY;  Surgeon: Lin Landsman, MD;  Location: [facility];  Service: Endoscopy;  Laterality: N/A;  . REPLACEMENT TOTAL KNEE  2013   RT  . TOTAL SHOULDER REPLACEMENT  2012   RT     There were no vitals filed for this visit.  Subjective Assessment - 11/21/18 1115    Subjective  The patient reports significant stresses in her life, which affects her vocal quality            ADULT SLP TREATMENT - 11/21/18 0001      General Information   Behavior/Cognition  Alert;Cooperative;Pleasant mood    HPI  73 year old woman referred for speech therapy for dysphonia.  Abnormal laryngeal findings include erythema, cobblestoning, edema, and bilateral muscle tension dysphonia with touching of the false vocal folds with phonation.         Treatment Provided   Treatment provided  Cognitive-Linquistic      Pain Assessment   Pain Assessment  No/denies pain      Cognitive-Linquistic Treatment   Treatment focused on  Voice    Skilled Treatment  The patient was provided with written and verbal teaching regarding vocal hygiene.  She is reducing frequency and force of throat clearing.  The patient was provided  with written and verbal teaching regarding neck, shoulder, tongue, and throat stretches exercises to promote relaxed phonation. The patient was provided with written and verbal teaching regarding breath support exercises.  The patient was provided with verbal and written instruction in semi-occluded vocal tract exercises: "WOO" and "WOO" contours of loudness.  The patient is able to maintain relaxed phonation with 80 to 90% accuracy in structured practice.  Patient able to maintain relaxed phonation for reading / generating sentences and structured conversation with 60% accuracy. She is able to sense and hear tense phonation and repair approximately 80% of the time.          Assessment / Recommendations / Plan   Plan  Continue with current plan of care      Progression Toward Goals   Progression toward goals  Progressing toward goals       SLP Education - 11/21/18 1115    Education Details  Be patient, you have made progress and will continue to do so    Person(s) Educated  Patient    Methods  Explanation    Comprehension  Verbalized understanding         SLP Long Term Goals - 10/24/18 1504      SLP LONG TERM GOAL #1   Title  The patient will demonstrate independent understanding of vocal hygiene concepts and extrinsic laryngeal muscle stretches.      Time  8    Period  Weeks    Status  New    Target Date  12/24/18      SLP LONG TERM GOAL #2   Title  The patient will be independent for abdominal breathing and breath support exercises.    Time  8    Period  Weeks    Status  New    Target Date  12/24/18      SLP LONG TERM GOAL #3   Title  The patient will minimize vocal tension via resonant voice therapy (or comparable technique) with min SLP cues with 80% accuracy.    Time  4    Period  Weeks    Status  New    Target Date  11/24/18      SLP LONG TERM GOAL #4   Title  The patient will maintain relaxed phonation / oral resonance for paragraph length recitation with 80% accuracy.    Time  8    Period  Weeks    Status  New    Target Date  12/24/18         Patient will benefit from skilled therapeutic intervention in order to improve the following deficits and impairments:   1. Dysphonia       Problem List Patient Active Problem List   Diagnosis Date Noted  . Colon cancer screening   . Arm pain, right 11/08/2017  . Renal insufficiency 11/08/2017  . Tendinitis of right knee 07/03/2017  . Lumbar radiculopathy 05/18/2017  . Status post hemilaminotomy 05/18/2017  . Lumbar degenerative disc disease 05/18/2017  . Spinal stenosis of lumbar region without neurogenic claudication 05/18/2017  . Atrial tachycardia ([place]) 04/14/2017  .  SVT (supraventricular tachycardia) ([place]) 04/14/2017  . Essential hypertension 04/14/2017  . Intermittent asthma without complication 36/14/4315  . Osteoporosis 03/21/2017  . Benign essential tremor 03/21/2017  . Obesity 09/14/2011  . Hyperlipidemia 05/12/2003  . Personal history of transient ischemic attack (TIA), and cerebral infarction without residual deficits 04/22/2003   Leroy Sea, MS/CCC- SLP  Valetta Fuller, Daine Floras 11/21/2018, 11:17 AM  [place] MAIN [facility] SERVICES [address] [place], [place], [address] Phone: [phone]   Fax:  [phone]   Name: Ercie Eliasen MRN: [id number] Date of Birth: 01-May-1946

## 2018-11-23 ENCOUNTER — Encounter: Payer: Self-pay | Admitting: Speech Pathology

## 2018-11-23 ENCOUNTER — Ambulatory Visit: Payer: Medicare Other

## 2018-11-23 ENCOUNTER — Ambulatory Visit: Payer: Medicare Other | Admitting: Speech Pathology

## 2018-11-23 ENCOUNTER — Other Ambulatory Visit: Payer: Self-pay

## 2018-11-23 DIAGNOSIS — R2689 Other abnormalities of gait and mobility: Secondary | ICD-10-CM | POA: Diagnosis not present

## 2018-11-23 DIAGNOSIS — R490 Dysphonia: Secondary | ICD-10-CM | POA: Diagnosis not present

## 2018-11-23 DIAGNOSIS — R2681 Unsteadiness on feet: Secondary | ICD-10-CM | POA: Diagnosis not present

## 2018-11-23 NOTE — Patient Instructions (Signed)
Access Code: [email or web address]  URL: [email or web address]  Date: 11/23/2018  Prepared by: Janna Arch   Exercises Standing March with Counter Support - 10 reps - 2 sets - 5 hold - 1x daily - 7x weekly Seated Hip Abduction with Resistance - 10 reps - 2 sets - 5 hold - 1x daily - 7x weekly Standing Hip Abduction with Counter Support - 10 reps - 2 sets - 5 hold - 1x daily - 7x weekly Seated Heel Toe Raises - 10 reps - 2 sets - 5 hold - 1x daily - 7x weekly

## 2018-11-23 NOTE — Therapy (Signed)
[place] MAIN [facility] SERVICES [address] [place], [place], [address] Phone: [phone]   Fax:  [phone]  Speech Language Pathology Treatment  Patient Details  Name: Audrey Martinez MRN: [id number] Date of Birth: 1945/09/03 Referring Provider (SLP): Dr. Pryor Ochoa   Encounter Date: 11/23/2018  End of Session - 11/23/18 1259    Visit Number  9    Number of Visits  17    Date for SLP Re-Evaluation  12/24/18    Authorization Type  Medicare    Authorization Time Period  Start 10/26/2018    Authorization - Visit Number  9    Authorization - Number of Visits  10    SLP Start Time  1000    SLP Stop Time   1050    SLP Time Calculation (min)  50 min    Activity Tolerance  Patient tolerated treatment well       Past Medical History:  Diagnosis Date  . Arthritis   . Asthma   . GERD (gastroesophageal reflux disease)   . Herniated disc, cervical   . Left wrist fracture   . Stroke ([place]) 1998  . SVT (supraventricular tachycardia) (HCC)     Past Surgical History:  Procedure Laterality Date  . BACK SURGERY  2017   L2-4 laminectomy and foraminal stenosis  . BREAST CYST ASPIRATION Left 1977  . CARDIAC CATHETERIZATION  2009   [facility] in [place]  . CARDIAC ELECTROPHYSIOLOGY STUDY AND ABLATION  2009  . CATARACT EXTRACTION, BILATERAL  2010  . COLONOSCOPY WITH PROPOFOL N/A 03/21/2018   Procedure: COLONOSCOPY WITH Biopsy;  Surgeon: Lin Landsman, MD;  Location: [place];  Service: Endoscopy;  Laterality: N/A;  . derrick procedure  1977-78   bilateral   . ESOPHAGOGASTRODUODENOSCOPY  2013   gastritis; done for complaint of dysphagia  . ESOPHAGOGASTRODUODENOSCOPY (EGD) WITH PROPOFOL N/A 03/21/2018   Procedure: ESOPHAGOGASTRODUODENOSCOPY (EGD) WITH Biopsies;  Surgeon: Lin Landsman, MD;  Location: [place];  Service: Endoscopy;  Laterality: N/A;  KEEP THIS PATIENT FIRST  . lymph node  removal     neck  . MOHS SURGERY  07/2016   BCCA nose  . POLYPECTOMY N/A 03/21/2018   Procedure: POLYPECTOMY;  Surgeon: Lin Landsman, MD;  Location: [place];  Service: Endoscopy;  Laterality: N/A;  . REPLACEMENT TOTAL KNEE  2013   RT  . TOTAL SHOULDER REPLACEMENT  2012   RT     There were no vitals filed for this visit.         ADULT SLP TREATMENT - 11/23/18 0001      General Information   Behavior/Cognition  Alert;Cooperative;Pleasant mood    HPI  73 year old woman referred for speech therapy for dysphonia.  Abnormal laryngeal findings include erythema, cobblestoning, edema, and bilateral muscle tension dysphonia with touching of the false vocal folds with phonation.         Treatment Provided   Treatment provided  Cognitive-Linquistic      Pain Assessment   Pain Assessment  No/denies pain      Cognitive-Linquistic Treatment   Treatment focused on  Voice    Skilled Treatment  The patient was provided with written and verbal teaching regarding vocal hygiene.  She is reducing frequency and force of throat clearing.  The patient was provided with written and verbal teaching regarding neck, shoulder, tongue, and throat stretches exercises to promote relaxed phonation. The patient was provided with written and verbal teaching regarding  breath support exercises.  The patient was provided with verbal and written instruction in semi-occluded vocal tract exercises: "WOO" and "WOO" contours of loudness.  The patient is able to maintain relaxed phonation with 80 to 90% accuracy in structured practice.  Patient able to maintain relaxed phonation for reading / generating sentences and structured conversation with 80% accuracy. She is able to sense and hear tense phonation and repair approximately 90% of the time.         Assessment / Recommendations / Plan   Plan  Continue with current plan of care      Progression Toward Goals   Progression toward goals  Progressing  toward goals       SLP Education - 11/23/18 1258    Education Details  Patient reports that many friends and family are commenting on improved vocal quality    Person(s) Educated  Patient    Methods  Explanation    Comprehension  Verbalized understanding         SLP Long Term Goals - 10/24/18 1504      SLP LONG TERM GOAL #1   Title  The patient will demonstrate independent understanding of vocal hygiene concepts and extrinsic laryngeal muscle stretches.      Time  8    Period  Weeks    Status  New    Target Date  12/24/18      SLP LONG TERM GOAL #2   Title  The patient will be independent for abdominal breathing and breath support exercises.    Time  8    Period  Weeks    Status  New    Target Date  12/24/18      SLP LONG TERM GOAL #3   Title  The patient will minimize vocal tension via resonant voice therapy (or comparable technique) with min SLP cues with 80% accuracy.    Time  4    Period  Weeks    Status  New    Target Date  11/24/18      SLP LONG TERM GOAL #4   Title  The patient will maintain relaxed phonation / oral resonance for paragraph length recitation with 80% accuracy.    Time  8    Period  Weeks    Status  New    Target Date  12/24/18       Plan - 11/23/18 1300    Clinical Impression Statement  Patient able to improve vocal quality with semi-occluded vocal tract to improve oral resonance and decrease laryngeal strain.  She is able to sense and hear tense phonation and repair with greater effectiveness.  Her overall vocal quality is improved and her ability to repair tense productions has improved.    Speech Therapy Frequency  2x / week    Duration  Other (comment)    Treatment/Interventions  Patient/family education;Other (comment)    Potential Considerations  Ability to learn/carryover information;Co-morbidities;Severity of impairments;Cooperation/participation level;Medical prognosis;Family/community support    SLP Home Exercise Plan  Provided     Consulted and Agree with Plan of Care  Patient       Patient will benefit from skilled therapeutic intervention in order to improve the following deficits and impairments:   1. Dysphonia       Problem List Patient Active Problem List   Diagnosis Date Noted  . Colon cancer screening   . Arm pain, right 11/08/2017  . Renal insufficiency 11/08/2017  . Tendinitis of right knee 07/03/2017  . Lumbar  radiculopathy 05/18/2017  . Status post hemilaminotomy 05/18/2017  . Lumbar degenerative disc disease 05/18/2017  . Spinal stenosis of lumbar region without neurogenic claudication 05/18/2017  . Atrial tachycardia ([place]) 04/14/2017  . SVT (supraventricular tachycardia) ([place]) 04/14/2017  . Essential hypertension 04/14/2017  . Intermittent asthma without complication 77/03/6578  . Osteoporosis 03/21/2017  . Benign essential tremor 03/21/2017  . Obesity 09/14/2011  . Hyperlipidemia 05/12/2003  . Personal history of transient ischemic attack (TIA), and cerebral infarction without residual deficits 04/22/2003   Leroy Sea, MS/CCC- SLP  Lou Miner 11/23/2018, 1:02 PM  [place] MAIN [facility] SERVICES [address] [place], [place], [address] Phone: [phone]   Fax:  [phone]   Name: Audrey Martinez MRN: [id number] Date of Birth: May 29, 1946

## 2018-11-23 NOTE — Therapy (Signed)
[place] MAIN [facility] SERVICES [address] [place], [place], [address] Phone: [phone]   Fax:  [phone]  Physical Therapy Evaluation  Patient Details  Name: Audrey Martinez MRN: [id number] Date of Birth: 04/18/1946 Referring Provider (PT): laura burgland   Encounter Date: 11/23/2018  PT End of Session - 11/23/18 1135    Visit Number  1    Number of Visits  16    Date for PT Re-Evaluation  01/18/19    Authorization Type  1/10 eval 6/26    PT Start Time  0913    PT Stop Time  1000    PT Time Calculation (min)  47 min    Equipment Utilized During Treatment  Gait belt    Activity Tolerance  Patient tolerated treatment well    Behavior During Therapy  WFL for tasks assessed/performed       Past Medical History:  Diagnosis Date  . Arthritis   . Asthma   . GERD (gastroesophageal reflux disease)   . Herniated disc, cervical   . Left wrist fracture   . Stroke ([place]) 1998  . SVT (supraventricular tachycardia) (HCC)     Past Surgical History:  Procedure Laterality Date  . BACK SURGERY  2017   L2-4 laminectomy and foraminal stenosis  . BREAST CYST ASPIRATION Left 1977  . CARDIAC CATHETERIZATION  2009   [facility] in [place]  . CARDIAC ELECTROPHYSIOLOGY STUDY AND ABLATION  2009  . CATARACT EXTRACTION, BILATERAL  2010  . COLONOSCOPY WITH PROPOFOL N/A 03/21/2018   Procedure: COLONOSCOPY WITH Biopsy;  Surgeon: Lin Landsman, MD;  Location: [place];  Service: Endoscopy;  Laterality: N/A;  . derrick procedure  1977-78   bilateral   . ESOPHAGOGASTRODUODENOSCOPY  2013   gastritis; done for complaint of dysphagia  . ESOPHAGOGASTRODUODENOSCOPY (EGD) WITH PROPOFOL N/A 03/21/2018   Procedure: ESOPHAGOGASTRODUODENOSCOPY (EGD) WITH Biopsies;  Surgeon: Lin Landsman, MD;  Location: [place];  Service: Endoscopy;  Laterality: N/A;  KEEP THIS PATIENT FIRST  . lymph node removal     neck  . MOHS SURGERY  07/2016   BCCA nose  . POLYPECTOMY N/A 03/21/2018   Procedure: POLYPECTOMY;  Surgeon: Lin Landsman, MD;  Location: [place];  Service: Endoscopy;  Laterality: N/A;  . REPLACEMENT TOTAL KNEE  2013   RT  . TOTAL SHOULDER REPLACEMENT  2012   RT     There were no vitals filed for this visit.   Subjective Assessment - 11/23/18 0927    Subjective  Patient is a pleasant 73 year female who presents for gait instability.    Pertinent History  Patient is a pleasant 73 year female who presents for gait instability. Patient utilizes cane intermittently since her balance has degraded over the past year. She reports she has a worsening of balance when she lost her dog/her dog was stolen.  Patient is going back to [facility] for R shoulder (reverse) due to increased pain. Left leg occasioanlly goes numb/gives way due to back surgeries. Patient's PMH includes SVT, stroke, herniated discs, GERD, asthma, Rt shoulder replacement, Rt knee replacement, and l2-4 laminectomy.    Limitations  Sitting;Lifting;Standing;Walking;House hold activities;Other (comment)    How long can you sit comfortably?  has back pain with prolonged sitting    How long can you stand comfortably?  needs to hold onto something, 5 minutes    How long can you walk comfortably?  legs go numb from back occasionally  Patient Stated Goals  increase strength to walk up incline to mailbox.    Currently in Pain?  No/denies     Patient is a pleasant 73 year female who presents for gait instability. Patient utilizes cane intermittently since her balance has degraded over the past year. She reports she has a worsening of balance when she lost her dog/her dog was stolen.  Patient is going back to [facility] for R shoulder (reverse) due to increased pain. Left leg occasioanlly goes numb/gives way due to back surgeries. Patient's PMH includes SVT, stroke, herniated discs, GERD, asthma, Rt shoulder  replacement, Rt knee replacement, and l2-4 laminectomy.   PAIN: Patient reports having intermittent back pain and LLE pain, occasionally has intermittent R shoulder pain from reverse shoulder.   POSTURE: Seated: frequent weight shift laterally Standing: weight shift onto LLE   PROM/AROM: Spine: Limited back mobility due to previous surgeries LEs:  Limited hamstring length bilaterally however is functional  STRENGTH:  Graded on a 0-5 scale Muscle Group Left Right  Hip Flex 45 4-/5  Hip Abd 3-/5 2+/5  Hip Add 3/5 3-/5  Hip Ext 2+/5 2/5  Hip IR/ER    Knee Flex 4/5 4/5  Knee Ext 4/5 4/5  Ankle DF 4/5 4/5  Ankle PF 4/5 4/5   SENSATION: Slight loss of sensation of R knee   FUNCTIONAL MOBILITY: STS: LLE slightly posterior with increased weight acceptance, excessive use of BUE's  BALANCE: Static Sitting Balance  Normal Able to maintain balance against maximal resistance   Good Able to maintain balance against moderate resistance   Good-/Fair+ Accepts minimal resistance x  Fair Able to sit unsupported without balance loss and without UE support   Poor+ Able to maintain with Minimal assistance from individual or chair   Poor Unable to maintain balance-requires mod/max support from individual or chair     Static Standing Balance  Normal Able to maintain standing balance against maximal resistance   Good Able to maintain standing balance against moderate resistance   Good-/Fair+ Able to maintain standing balance against minimal resistance   Fair Able to stand unsupported without UE support and without LOB for 1-2 min x  Fair- Requires Min A and UE support to maintain standing without loss of balance   Poor+ Requires mod A and UE support to maintain standing without loss of balance   Poor Requires max A and UE support to maintain standing balance without loss     Dynamic Sitting Balance  Normal Able to sit unsupported and weight shift across midline maximally   Good Able to  sit unsupported and weight shift across midline moderately   Good-/Fair+ Able to sit unsupported and weight shift across midline minimally   Fair Minimal weight shifting ipsilateral/front, difficulty crossing midline x  Fair- Reach to ipsilateral side and unable to weight shift   Poor + Able to sit unsupported with min A and reach to ipsilateral side, unable to weight shift   Poor Able to sit unsupported with mod A and reach ipsilateral/front-can't cross midline     Standing Dynamic Balance  Normal Stand independently unsupported, able to weight shift and cross midline maximally   Good Stand independently unsupported, able to weight shift and cross midline moderately   Good-/Fair+ Stand independently unsupported, able to weight shift across midline minimally   Fair Stand independently unsupported, weight shift, and reach ipsilaterally, loss of balance when crossing midline x  Poor+ Able to stand with Min A and reach ipsilaterally, unable to  weight shift   Poor Able to stand with Mod A and minimally reach ipsilaterally, unable to cross midline.      GAIT: Patient occasionally utilizes [facility] when feeling unsteady, otherwise holds it in air. Has a vaulting pattern of ambulation over RLE with noticeable weight shift/acceptance preference on LLE  OUTCOME MEASURES: TEST Outcome Interpretation  5 times sit<>stand 23 sec LLE back with BUE support  >36 yo, >15 sec indicates increased risk for falls  10 meter walk test          1.11 m/s <1.0 m/s indicates increased risk for falls; limited community ambulator  ABC 51.25%   Berg Balance Assessment 39/56 <36/56 (100% risk for falls), 37-45 (80% risk for falls); 46-51 (>50% risk for falls); 52-55 (lower risk <25% of falls)           [facility] PT Assessment - 11/23/18 0001      Assessment   Medical Diagnosis  gait instability    Referring Provider (PT)  laura burgland    Onset Date/Surgical Date  --   about a year    Hand Dominance  Right       Precautions   Precautions  --   has a reverse R shoulder     Restrictions   Weight Bearing Restrictions  No      Balance Screen   Has the patient fallen in the past 6 months  No    Has the patient had a decrease in activity level because of a fear of falling?   Yes    Is the patient reluctant to leave their home because of a fear of falling?   Yes      [place] residence    Living Arrangements  Alone    Available Help at Discharge  Family    Type of [place]  One level    [place] - single point;Walker - 2 wheels   has a bedside commode, rollator, walker from previous sx      Prior Function   Level of Independence  Independent    Vocation  Retired    Leisure  jigsaw puzzles on computer, play hayday       Balance   Balance Assessed  Yes      Standardized Balance Assessment   Standardized Balance Assessment  Berg Balance Test      Berg Balance Test   Sit to Stand  Able to stand  independently using hands    Standing Unsupported  Able to stand safely 2 minutes    Sitting with Back Unsupported but Feet Supported on Floor or Stool  Able to sit safely and securely 2 minutes    Stand to Sit  Controls descent by using hands    Transfers  Able to transfer safely, definite need of hands    Standing Unsupported with Eyes Closed  Able to stand 3 seconds    Standing Unsupported with Feet Together  Able to place feet together independently and stand for 1 minute with supervision    From Standing, Reach Forward with Outstretched Arm  Can reach forward >5 cm safely (2")    From Standing Position, Pick up Object from Floor  Able to pick up shoe, needs supervision    From Standing Position, Turn to Look Behind Over each Shoulder  Looks behind from both sides  and weight shifts well    Turn 360 Degrees  Able to turn 360 degrees safely but slowly    Standing Unsupported, Alternately  Place Feet on Step/Stool  Able to stand independently and complete 8 steps >20 seconds    Standing Unsupported, One Foot in Front  Able to take small step independently and hold 30 seconds    Standing on One Leg  Tries to lift leg/unable to hold 3 seconds but remains standing independently    Total Score  39        Access Code: [email or web address]  URL: [email or web address]  Date: 11/23/2018  Prepared by: Janna Arch   Exercises Standing March with Counter Support - 10 reps - 2 sets - 5 hold - 1x daily - 7x weekly Seated Hip Abduction with Resistance - 10 reps - 2 sets - 5 hold - 1x daily - 7x weekly Standing Hip Abduction with Counter Support - 10 reps - 2 sets - 5 hold - 1x daily - 7x weekly Seated Heel Toe Raises - 10 reps - 2 sets - 5 hold - 1x daily - 7x weekly         Objective measurements completed on examination: See above findings.              PT Education - 11/23/18 1134    Education Details  POC, goals, HEP    Person(s) Educated  Patient    Methods  Explanation;Demonstration;Tactile cues;Verbal cues;Handout    Comprehension  Verbalized understanding;Returned demonstration;Verbal cues required;Tactile cues required       PT Short Term Goals - 11/23/18 1201      PT SHORT TERM GOAL #1   Title  Patient will be independent in home exercise program to improve strength/mobility for better functional independence with ADLs.    Baseline  6/26: given    Time  2    Period  Weeks    Status  New    Target Date  12/07/18        PT Long Term Goals - 11/23/18 1202      PT LONG TERM GOAL #1   Title  Patient will increase dynamic gait index score to >19/24 as to demonstrate reduced fall risk and improved dynamic gait balance for better safety with community/home ambulation.    Baseline  6/26: perform next session    Time  8    Period  Weeks    Status  New    Target Date  01/18/19      PT LONG TERM GOAL #2   Title  Patient (> 57 years old) will  complete five times sit to stand test in < 15 seconds without UE support indicating an increased LE strength and improved balance.    Baseline  6/26: 23 seconds with excessive BUE support    Time  8    Period  Weeks    Status  New    Target Date  01/18/19      PT LONG TERM GOAL #3   Title  Patient will increase Berg Balance score by > 6 points (45/56)  to demonstrate decreased fall risk during functional activities.    Baseline  6/26: 39/56    Time  8    Period  Weeks    Status  New    Target Date  01/18/19      PT LONG TERM GOAL #4   Title  Patient will increase ABC scale score >80% to demonstrate better functional mobility and better  confidence with ADLs.    Baseline  6/26: 51.25%    Time  8    Period  Weeks    Status  New    Target Date  01/18/19      PT LONG TERM GOAL #5   Title  Patient will be modified independent in walking on even/uneven surface with least restrictive assistive device, for 20+ minutes without rest break, reporting some difficulty or less to improve walking tolerance with community ambulation including grocery shopping, going to church,etc.    Baseline  6/26: 10 minutes    Time  8    Period  Weeks    Status  New    Target Date  01/18/19             Plan - 11/23/18 1137    Clinical Impression Statement  Patient is a pleasant 73 year old female who presents to physical therapy for gait instability. She reports occasional giving way/buckling of LLE however it was not seen this session. Right LE has noticeable weakness and deficits with limited weight acceptance with mobility and transfers. Patient requires excessive use of UE's for transfers and has limited stability with single limb demand tasks. Patient will benefit from skilled physical therapy to increase her stability, gait mechanics, strength, and capacity for functional mobility.    Personal Factors and Comorbidities  Age;Comorbidity 3+;Fitness;Past/Current Experience;Social Background;Time since  onset of injury/illness/exacerbation    Comorbidities  SVT, stroke, herniated discs, GERD, asthma, reverse rt shoulder replacement, L knee replacement    Examination-Activity Limitations  Bend;Caring for Others;Hygiene/Grooming;Lift;Locomotion Level;Reach Overhead;Sit;Squat;Stairs;Stand;Toileting;Transfers    Examination-Participation Restrictions  Church;Community Activity;Driving;Interpersonal Relationship;Laundry;Meal Prep;Shop;Volunteer;Yard Work    [employer]  Evolving/Moderate complexity    Clinical Decision Making  Moderate    Rehab Potential  Fair    PT Frequency  2x / week    PT Duration  8 weeks    PT Treatment/Interventions  ADLs/Self Care Home Management;Aquatic Therapy;Cryotherapy;Electrical Stimulation;Iontophoresis 4mg /ml Dexamethasone;Moist Heat;Traction;Ultrasound;Functional mobility training;Stair training;Gait training;DME Instruction;Therapeutic activities;Therapeutic exercise;Balance training;Neuromuscular re-education;Manual techniques;Patient/family education;Compression bandaging;Passive range of motion;Dry needling;Taping;Energy conservation    PT Next Visit Plan  review HEP, standing dynamic stability, DGI    PT Home Exercise Plan  see above       Patient will benefit from skilled therapeutic intervention in order to improve the following deficits and impairments:  Abnormal gait, Decreased activity tolerance, Decreased balance, Decreased endurance, Decreased coordination, Decreased cognition, Decreased mobility, Decreased safety awareness, Difficulty walking, Decreased strength, Dizziness, Impaired flexibility, Impaired perceived functional ability, Impaired UE functional use, Postural dysfunction, Improper body mechanics, Pain  Visit Diagnosis: 1. Other abnormalities of gait and mobility   2. Unsteadiness on feet        Problem List Patient Active Problem List   Diagnosis Date Noted  . Colon cancer screening   . Arm pain, right  11/08/2017  . Renal insufficiency 11/08/2017  . Tendinitis of right knee 07/03/2017  . Lumbar radiculopathy 05/18/2017  . Status post hemilaminotomy 05/18/2017  . Lumbar degenerative disc disease 05/18/2017  . Spinal stenosis of lumbar region without neurogenic claudication 05/18/2017  . Atrial tachycardia ([place]) 04/14/2017  . SVT (supraventricular tachycardia) ([place]) 04/14/2017  . Essential hypertension 04/14/2017  . Intermittent asthma without complication 60/45/4098  . Osteoporosis 03/21/2017  . Benign essential tremor 03/21/2017  . Obesity 09/14/2011  . Hyperlipidemia 05/12/2003  . Personal history of transient ischemic attack (TIA), and cerebral infarction without residual deficits 04/22/2003   Janna Arch, PT, DPT   11/23/2018, 12:07 PM  [place] MAIN [facility] SERVICES [address] [place], [place], [address] Phone: [phone]   Fax:  [phone]  Name: Audrey Martinez MRN: [id number] Date of Birth: [birth year]

## 2018-11-26 ENCOUNTER — Encounter: Payer: Self-pay | Admitting: Speech Pathology

## 2018-11-26 ENCOUNTER — Other Ambulatory Visit: Payer: Self-pay

## 2018-11-26 ENCOUNTER — Ambulatory Visit: Payer: Medicare Other | Admitting: Speech Pathology

## 2018-11-26 DIAGNOSIS — R2681 Unsteadiness on feet: Secondary | ICD-10-CM | POA: Diagnosis not present

## 2018-11-26 DIAGNOSIS — R490 Dysphonia: Secondary | ICD-10-CM

## 2018-11-26 DIAGNOSIS — R2689 Other abnormalities of gait and mobility: Secondary | ICD-10-CM | POA: Diagnosis not present

## 2018-11-26 NOTE — Therapy (Signed)
[place] MAIN [facility] SERVICES [address] [place], [place], [address] Phone: [phone]   Fax:  [phone]  Speech Language Pathology Treatment/Progress Note  Speech Therapy Progress Note   Dates of reporting period  10/26/2018   to   11/26/2018   Patient Details  Name: Audrey Martinez MRN: [id number] Date of Birth: 05/19/1946 Referring Provider (SLP): Dr. Pryor Ochoa   Encounter Date: 11/26/2018  End of Session - 11/26/18 1154    Visit Number  10    Number of Visits  17    Date for SLP Re-Evaluation  12/24/18    Authorization Type  Medicare    Authorization Time Period  Start 10/26/2018    Authorization - Visit Number  10    Authorization - Number of Visits  10    SLP Start Time  1000    SLP Stop Time   1050    SLP Time Calculation (min)  50 min    Activity Tolerance  Patient tolerated treatment well       Past Medical History:  Diagnosis Date  . Arthritis   . Asthma   . GERD (gastroesophageal reflux disease)   . Herniated disc, cervical   . Left wrist fracture   . Stroke ([place]) 1998  . SVT (supraventricular tachycardia) (HCC)     Past Surgical History:  Procedure Laterality Date  . BACK SURGERY  2017   L2-4 laminectomy and foraminal stenosis  . BREAST CYST ASPIRATION Left 1977  . CARDIAC CATHETERIZATION  2009   [facility] in [place]  . CARDIAC ELECTROPHYSIOLOGY STUDY AND ABLATION  2009  . CATARACT EXTRACTION, BILATERAL  2010  . COLONOSCOPY WITH PROPOFOL N/A 03/21/2018   Procedure: COLONOSCOPY WITH Biopsy;  Surgeon: Lin Landsman, MD;  Location: [place];  Service: Endoscopy;  Laterality: N/A;  . derrick procedure  1977-78   bilateral   . ESOPHAGOGASTRODUODENOSCOPY  2013   gastritis; done for complaint of dysphagia  . ESOPHAGOGASTRODUODENOSCOPY (EGD) WITH PROPOFOL N/A 03/21/2018   Procedure: ESOPHAGOGASTRODUODENOSCOPY (EGD) WITH Biopsies;  Surgeon: Lin Landsman, MD;   Location: [place];  Service: Endoscopy;  Laterality: N/A;  KEEP THIS PATIENT FIRST  . lymph node removal     neck  . MOHS SURGERY  07/2016   BCCA nose  . POLYPECTOMY N/A 03/21/2018   Procedure: POLYPECTOMY;  Surgeon: Lin Landsman, MD;  Location: [place];  Service: Endoscopy;  Laterality: N/A;  . REPLACEMENT TOTAL KNEE  2013   RT  . TOTAL SHOULDER REPLACEMENT  2012   RT     There were no vitals filed for this visit.  Subjective Assessment - 11/26/18 1152    Subjective  The patient reports significant stresses in her life, which affects her vocal quality            ADULT SLP TREATMENT - 11/26/18 0001      General Information   Behavior/Cognition  Alert;Cooperative;Pleasant mood    HPI  73 year old woman referred for speech therapy for dysphonia.  Abnormal laryngeal findings include erythema, cobblestoning, edema, and bilateral muscle tension dysphonia with touching of the false vocal folds with phonation.         Treatment Provided   Treatment provided  Cognitive-Linquistic      Pain Assessment   Pain Assessment  No/denies pain      Cognitive-Linquistic Treatment   Treatment focused on  Voice    Skilled Treatment  The patient was provided with written  and verbal teaching regarding vocal hygiene.  She is reducing frequency and force of throat clearing.  The patient was provided with written and verbal teaching regarding neck, shoulder, tongue, and throat stretches exercises to promote relaxed phonation. The patient was provided with written and verbal teaching regarding breath support exercises.  The patient was provided with verbal and written instruction in semi-occluded vocal tract exercises: "WOO" and "WOO" contours of loudness.  The patient is able to maintain relaxed phonation with 80 to 90% accuracy in structured practice.  Patient able to maintain relaxed phonation for reading / generating sentences and structured conversation with 60%  accuracy. She is able to sense and hear tense phonation and repair approximately 80% of the time.  Last session the patient was at 80% accuracy, she states that the [place] dust is affecting her voice.       Assessment / Recommendations / Plan   Plan  Continue with current plan of care      Progression Toward Goals   Progression toward goals  Progressing toward goals       SLP Education - 11/26/18 1152    Education Details  Return to strategies that generate clear vocal quality (semi-occluded vocal tract exercises)    Person(s) Educated  Patient    Methods  Explanation    Comprehension  Verbalized understanding         SLP Long Term Goals - 11/26/18 1156      SLP LONG TERM GOAL #1   Title  The patient will demonstrate independent understanding of vocal hygiene concepts and extrinsic laryngeal muscle stretches.      Status  Achieved      SLP LONG TERM GOAL #2   Title  The patient will be independent for abdominal breathing and breath support exercises.    Status  Achieved      SLP LONG TERM GOAL #3   Title  The patient will minimize vocal tension via resonant voice therapy (or comparable technique) with min SLP cues with 80% accuracy.    Status  Achieved      SLP LONG TERM GOAL #4   Title  The patient will maintain relaxed phonation / oral resonance for paragraph length recitation with 80% accuracy.    Time  4    Period  Weeks    Status  Partially Met    Target Date  12/24/18       Plan - 11/26/18 1155    Clinical Impression Statement  Patient able to improve vocal quality with semi-occluded vocal tract to improve oral resonance and decrease laryngeal strain.  She is able to sense and hear tense phonation and repair with greater effectiveness.  Her overall vocal quality is improved and her ability to repair tense productions has improved.  The patient reports that family and friends have remarked on her improved vocal quality.  She continues to be inconsistent with her  ability to maintain clear vocal quality.  Will continue ST for voice therapy.    Speech Therapy Frequency  2x / week    Duration  Other (comment)    Treatment/Interventions  Patient/family education;Other (comment)   Voice therapy   Potential to Achieve Goals  Good    Potential Considerations  Ability to learn/carryover information;Co-morbidities;Severity of impairments;Cooperation/participation level;Medical prognosis;Family/community support    SLP Home Exercise Plan  Provided    Consulted and Agree with Plan of Care  Patient       Patient will benefit from skilled therapeutic intervention in  order to improve the following deficits and impairments:   1. Dysphonia       Problem List Patient Active Problem List   Diagnosis Date Noted  . Colon cancer screening   . Arm pain, right 11/08/2017  . Renal insufficiency 11/08/2017  . Tendinitis of right knee 07/03/2017  . Lumbar radiculopathy 05/18/2017  . Status post hemilaminotomy 05/18/2017  . Lumbar degenerative disc disease 05/18/2017  . Spinal stenosis of lumbar region without neurogenic claudication 05/18/2017  . Atrial tachycardia ([place]) 04/14/2017  . SVT (supraventricular tachycardia) ([place]) 04/14/2017  . Essential hypertension 04/14/2017  . Intermittent asthma without complication 26/71/2458  . Osteoporosis 03/21/2017  . Benign essential tremor 03/21/2017  . Obesity 09/14/2011  . Hyperlipidemia 05/12/2003  . Personal history of transient ischemic attack (TIA), and cerebral infarction without residual deficits 04/22/2003   Leroy Sea, MS/CCC- SLP  Lou Miner 11/26/2018, 11:57 AM  [place] MAIN [facility] SERVICES [address] [place], [place], [address] Phone: [phone]   Fax:  [phone]   Name: Audrey Martinez MRN: [id number] Date of Birth: Oct 14, 1945

## 2018-11-28 ENCOUNTER — Other Ambulatory Visit: Payer: Self-pay

## 2018-11-28 ENCOUNTER — Ambulatory Visit: Payer: Medicare Other | Attending: Unknown Physician Specialty | Admitting: Speech Pathology

## 2018-11-28 ENCOUNTER — Encounter: Payer: Self-pay | Admitting: Speech Pathology

## 2018-11-28 DIAGNOSIS — R2681 Unsteadiness on feet: Secondary | ICD-10-CM | POA: Insufficient documentation

## 2018-11-28 DIAGNOSIS — R490 Dysphonia: Secondary | ICD-10-CM | POA: Insufficient documentation

## 2018-11-28 DIAGNOSIS — R2689 Other abnormalities of gait and mobility: Secondary | ICD-10-CM | POA: Diagnosis not present

## 2018-11-28 NOTE — Therapy (Signed)
[place] MAIN [facility] SERVICES [address] [place], [place], [address] Phone: [phone]   Fax:  [phone]  Speech Language Pathology Treatment  Patient Details  Name: Audrey Martinez MRN: [id number] Date of Birth: 1945-08-19 Referring Provider (SLP): Dr. Pryor Ochoa   Encounter Date: 11/28/2018  End of Session - 11/28/18 1157    Visit Number  11    Number of Visits  17    Date for SLP Re-Evaluation  12/24/18    Authorization Type  Medicare    Authorization Time Period  Start 11/28/2018    Authorization - Visit Number  1    Authorization - Number of Visits  10    SLP Start Time  1000    SLP Stop Time   1050    SLP Time Calculation (min)  50 min    Activity Tolerance  Patient tolerated treatment well       Past Medical History:  Diagnosis Date  . Arthritis   . Asthma   . GERD (gastroesophageal reflux disease)   . Herniated disc, cervical   . Left wrist fracture   . Stroke ([place]) 1998  . SVT (supraventricular tachycardia) (HCC)     Past Surgical History:  Procedure Laterality Date  . BACK SURGERY  2017   L2-4 laminectomy and foraminal stenosis  . BREAST CYST ASPIRATION Left 1977  . CARDIAC CATHETERIZATION  2009   [facility] in [place]  . CARDIAC ELECTROPHYSIOLOGY STUDY AND ABLATION  2009  . CATARACT EXTRACTION, BILATERAL  2010  . COLONOSCOPY WITH PROPOFOL N/A 03/21/2018   Procedure: COLONOSCOPY WITH Biopsy;  Surgeon: Lin Landsman, MD;  Location: [place];  Service: Endoscopy;  Laterality: N/A;  . derrick procedure  1977-78   bilateral   . ESOPHAGOGASTRODUODENOSCOPY  2013   gastritis; done for complaint of dysphagia  . ESOPHAGOGASTRODUODENOSCOPY (EGD) WITH PROPOFOL N/A 03/21/2018   Procedure: ESOPHAGOGASTRODUODENOSCOPY (EGD) WITH Biopsies;  Surgeon: Lin Landsman, MD;  Location: [place];  Service: Endoscopy;  Laterality: N/A;  KEEP THIS PATIENT FIRST  . lymph node  removal     neck  . MOHS SURGERY  07/2016   BCCA nose  . POLYPECTOMY N/A 03/21/2018   Procedure: POLYPECTOMY;  Surgeon: Lin Landsman, MD;  Location: [place];  Service: Endoscopy;  Laterality: N/A;  . REPLACEMENT TOTAL KNEE  2013   RT  . TOTAL SHOULDER REPLACEMENT  2012   RT     There were no vitals filed for this visit.  Subjective Assessment - 11/28/18 1156    Subjective  The patient reports easing of some of her life stresses            ADULT SLP TREATMENT - 11/28/18 0001      General Information   Behavior/Cognition  Alert;Cooperative;Pleasant mood    HPI  73 year old woman referred for speech therapy for dysphonia.  Abnormal laryngeal findings include erythema, cobblestoning, edema, and bilateral muscle tension dysphonia with touching of the false vocal folds with phonation.         Treatment Provided   Treatment provided  Cognitive-Linquistic      Pain Assessment   Pain Assessment  No/denies pain      Cognitive-Linquistic Treatment   Treatment focused on  Voice    Skilled Treatment  The patient was provided with written and verbal teaching regarding vocal hygiene.  She is reducing frequency and force of throat clearing.  The patient was provided with written and  verbal teaching regarding neck, shoulder, tongue, and throat stretches exercises to promote relaxed phonation. The patient was provided with written and verbal teaching regarding breath support exercises.  The patient was provided with verbal and written instruction in semi-occluded vocal tract exercises: "WOO" and "WOO" contours of loudness.  The patient is able to maintain relaxed phonation with 80 to 90% accuracy in structured practice.  Patient able to maintain relaxed phonation for reading / generating sentences and structured conversation with 80% accuracy. She is able to sense and hear tense phonation and repair approximately 90% of the time.         Assessment / Recommendations / Plan    Plan  Continue with current plan of care      Progression Toward Goals   Progression toward goals  Progressing toward goals       SLP Education - 11/28/18 1156    Education Details  Stay relaxed when practicing    Person(s) Educated  Patient    Methods  Explanation    Comprehension  Verbalized understanding         SLP Long Term Goals - 11/26/18 1156      SLP LONG TERM GOAL #1   Title  The patient will demonstrate independent understanding of vocal hygiene concepts and extrinsic laryngeal muscle stretches.      Status  Achieved      SLP LONG TERM GOAL #2   Title  The patient will be independent for abdominal breathing and breath support exercises.    Status  Achieved      SLP LONG TERM GOAL #3   Title  The patient will minimize vocal tension via resonant voice therapy (or comparable technique) with min SLP cues with 80% accuracy.    Status  Achieved      SLP LONG TERM GOAL #4   Title  The patient will maintain relaxed phonation / oral resonance for paragraph length recitation with 80% accuracy.    Time  4    Period  Weeks    Status  Partially Met    Target Date  12/24/18       Plan - 11/28/18 1158    Clinical Impression Statement  Patient able to improve vocal quality with semi-occluded vocal tract to improve oral resonance and decrease laryngeal strain.  She is able to sense and hear tense phonation and repair with greater effectiveness.  Her overall vocal quality is improved and her ability to repair tense productions has improved.  The patient reports that family and friends have remarked on her improved vocal quality.  She continues to be inconsistent with her ability to maintain clear vocal quality.  Will continue ST for voice therapy.    Speech Therapy Frequency  2x / week    Duration  Other (comment)    Treatment/Interventions  Patient/family education;Other (comment)   Voice therapy   Potential to Achieve Goals  Good    Potential Considerations  Ability to  learn/carryover information;Co-morbidities;Severity of impairments;Cooperation/participation level;Medical prognosis;Family/community support    SLP Home Exercise Plan  Provided    Consulted and Agree with Plan of Care  Patient       Patient will benefit from skilled therapeutic intervention in order to improve the following deficits and impairments:   1. Dysphonia       Problem List Patient Active Problem List   Diagnosis Date Noted  . Colon cancer screening   . Arm pain, right 11/08/2017  . Renal insufficiency 11/08/2017  . Tendinitis  of right knee 07/03/2017  . Lumbar radiculopathy 05/18/2017  . Status post hemilaminotomy 05/18/2017  . Lumbar degenerative disc disease 05/18/2017  . Spinal stenosis of lumbar region without neurogenic claudication 05/18/2017  . Atrial tachycardia ([place]) 04/14/2017  . SVT (supraventricular tachycardia) ([place]) 04/14/2017  . Essential hypertension 04/14/2017  . Intermittent asthma without complication 88/03/314  . Osteoporosis 03/21/2017  . Benign essential tremor 03/21/2017  . Obesity 09/14/2011  . Hyperlipidemia 05/12/2003  . Personal history of transient ischemic attack (TIA), and cerebral infarction without residual deficits 04/22/2003   Leroy Sea, MS/CCC- SLP  Lou Miner 11/28/2018, 11:58 AM  [place] MAIN [facility] SERVICES [address] [place], [place], [address] Phone: [phone]   Fax:  [phone]   Name: Sister Carbone MRN: [id number] Date of Birth: 19-Apr-1946

## 2018-12-03 ENCOUNTER — Encounter: Payer: Self-pay | Admitting: Speech Pathology

## 2018-12-03 ENCOUNTER — Ambulatory Visit: Payer: Medicare Other | Admitting: Speech Pathology

## 2018-12-03 ENCOUNTER — Other Ambulatory Visit: Payer: Self-pay

## 2018-12-03 ENCOUNTER — Ambulatory Visit: Payer: Medicare Other | Admitting: Physical Therapy

## 2018-12-03 ENCOUNTER — Encounter: Payer: Self-pay | Admitting: Physical Therapy

## 2018-12-03 DIAGNOSIS — R2681 Unsteadiness on feet: Secondary | ICD-10-CM | POA: Diagnosis not present

## 2018-12-03 DIAGNOSIS — R2689 Other abnormalities of gait and mobility: Secondary | ICD-10-CM | POA: Diagnosis not present

## 2018-12-03 DIAGNOSIS — R490 Dysphonia: Secondary | ICD-10-CM

## 2018-12-03 NOTE — Therapy (Signed)
[place] MAIN [facility] SERVICES [address] [place], [place], [address] Phone: [phone]   Fax:  [phone]  Speech Language Pathology Treatment/Discharge Summary  Patient Details  Name: Audrey Martinez MRN: [id number] Date of Birth: 1945-12-24 Referring Provider (SLP): Dr. Pryor Ochoa   Encounter Date: 12/03/2018  End of Session - 12/03/18 1240    Visit Number  12    Number of Visits  17    Date for SLP Re-Evaluation  12/24/18    SLP Start Time  1000    SLP Stop Time   1045    SLP Time Calculation (min)  45 min    Activity Tolerance  Patient tolerated treatment well       Past Medical History:  Diagnosis Date  . Arthritis   . Asthma   . GERD (gastroesophageal reflux disease)   . Herniated disc, cervical   . Left wrist fracture   . Stroke ([place]) 1998  . SVT (supraventricular tachycardia) (HCC)     Past Surgical History:  Procedure Laterality Date  . BACK SURGERY  2017   L2-4 laminectomy and foraminal stenosis  . BREAST CYST ASPIRATION Left 1977  . CARDIAC CATHETERIZATION  2009   [facility] in [place]  . CARDIAC ELECTROPHYSIOLOGY STUDY AND ABLATION  2009  . CATARACT EXTRACTION, BILATERAL  2010  . COLONOSCOPY WITH PROPOFOL N/A 03/21/2018   Procedure: COLONOSCOPY WITH Biopsy;  Surgeon: Lin Landsman, MD;  Location: [place];  Service: Endoscopy;  Laterality: N/A;  . derrick procedure  1977-78   bilateral   . ESOPHAGOGASTRODUODENOSCOPY  2013   gastritis; done for complaint of dysphagia  . ESOPHAGOGASTRODUODENOSCOPY (EGD) WITH PROPOFOL N/A 03/21/2018   Procedure: ESOPHAGOGASTRODUODENOSCOPY (EGD) WITH Biopsies;  Surgeon: Lin Landsman, MD;  Location: [place];  Service: Endoscopy;  Laterality: N/A;  KEEP THIS PATIENT FIRST  . lymph node removal     neck  . MOHS SURGERY  07/2016   BCCA nose  . POLYPECTOMY N/A 03/21/2018   Procedure: POLYPECTOMY;  Surgeon: Lin Landsman, MD;  Location: [place];  Service: Endoscopy;  Laterality: N/A;  . REPLACEMENT TOTAL KNEE  2013   RT  . TOTAL SHOULDER REPLACEMENT  2012   RT     There were no vitals filed for this visit.  Subjective Assessment - 12/03/18 1240    Subjective  "I've got my voice back"            ADULT SLP TREATMENT - 12/03/18 0001      General Information   Behavior/Cognition  Alert;Cooperative;Pleasant mood    HPI  73 year old woman referred for speech therapy for dysphonia.  Abnormal laryngeal findings include erythema, cobblestoning, edema, and bilateral muscle tension dysphonia with touching of the false vocal folds with phonation.         Treatment Provided   Treatment provided  Cognitive-Linquistic      Pain Assessment   Pain Assessment  No/denies pain      Cognitive-Linquistic Treatment   Treatment focused on  Voice    Skilled Treatment  The patient was provided with written and verbal teaching regarding vocal hygiene.  She is reducing frequency and force of throat clearing.  The patient was provided with written and verbal teaching regarding neck, shoulder, tongue, and throat stretches exercises to promote relaxed phonation. The patient was provided with written and verbal teaching regarding breath support exercises.  The patient was provided with verbal and written instruction in semi-occluded  vocal tract exercises: "WOO" and "WOO" contours of loudness.  The patient is able to maintain relaxed phonation with 80 to 90% accuracy in structured practice.  Patient able to maintain relaxed phonation for reading / generating sentences and structured conversation with 80% accuracy. She is able to sense and hear tense phonation and repair approximately 90% of the time.         Assessment / Recommendations / Plan   Plan  Discharge SLP treatment due to (comment);All goals met      Progression Toward Goals   Progression toward goals  Goals met, education completed, patient  discharged from SLP       SLP Education - 12/03/18 1240    Education Details  You've got this    Person(s) Educated  Patient    Methods  Explanation    Comprehension  Verbalized understanding         SLP Long Term Goals - 12/03/18 1246      SLP LONG TERM GOAL #1   Title  The patient will demonstrate independent understanding of vocal hygiene concepts and extrinsic laryngeal muscle stretches.      Status  Achieved      SLP LONG TERM GOAL #2   Title  The patient will be independent for abdominal breathing and breath support exercises.    Status  Achieved      SLP LONG TERM GOAL #3   Title  The patient will minimize vocal tension via resonant voice therapy (or comparable technique) with min SLP cues with 80% accuracy.    Status  Achieved      SLP LONG TERM GOAL #4   Title  The patient will maintain relaxed phonation / oral resonance for paragraph length recitation with 80% accuracy.    Status  Achieved       Plan - 12/03/18 1241    Clinical Impression Statement  Patient able to improve vocal quality with semi-occluded vocal tract to improve oral resonance and decrease laryngeal strain.  She is able to sense and hear tense phonation and repair with effectiveness.  The patient states that she feels that she has "found my voice" and is ready for discharge.    Speech Therapy Frequency  Other (comment)   Discharge   Treatment/Interventions  Patient/family education;Other (comment)   Voice therapy   Potential to Achieve Goals  Good    Potential Considerations  Ability to learn/carryover information;Co-morbidities;Severity of impairments;Cooperation/participation level;Medical prognosis;Family/community support    Consulted and Agree with Plan of Care  Patient       Patient will benefit from skilled therapeutic intervention in order to improve the following deficits and impairments:   1. Dysphonia       Problem List Patient Active Problem List   Diagnosis Date Noted  .  Colon cancer screening   . Arm pain, right 11/08/2017  . Renal insufficiency 11/08/2017  . Tendinitis of right knee 07/03/2017  . Lumbar radiculopathy 05/18/2017  . Status post hemilaminotomy 05/18/2017  . Lumbar degenerative disc disease 05/18/2017  . Spinal stenosis of lumbar region without neurogenic claudication 05/18/2017  . Atrial tachycardia ([place]) 04/14/2017  . SVT (supraventricular tachycardia) ([place]) 04/14/2017  . Essential hypertension 04/14/2017  . Intermittent asthma without complication 81/05/7508  . Osteoporosis 03/21/2017  . Benign essential tremor 03/21/2017  . Obesity 09/14/2011  . Hyperlipidemia 05/12/2003  . Personal history of transient ischemic attack (TIA), and cerebral infarction without residual deficits 04/22/2003   Leroy Sea, MS/CCC- SLP  Lou Miner 12/03/2018,  12:49 PM  [place] MAIN [facility] SERVICES [address] [place], [place], [address] Phone: [phone]   Fax:  [phone]   Name: Audrey Martinez MRN: [id number] Date of Birth: 09/09/1945
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
## 2018-12-05 ENCOUNTER — Ambulatory Visit: Payer: Medicare Other | Admitting: Speech Pathology

## 2018-12-05 ENCOUNTER — Other Ambulatory Visit: Payer: Self-pay

## 2018-12-05 ENCOUNTER — Encounter: Payer: Self-pay | Admitting: Physical Therapy

## 2018-12-05 ENCOUNTER — Ambulatory Visit: Payer: Medicare Other | Admitting: Physical Therapy

## 2018-12-05 DIAGNOSIS — R2689 Other abnormalities of gait and mobility: Secondary | ICD-10-CM | POA: Diagnosis not present

## 2018-12-05 DIAGNOSIS — R490 Dysphonia: Secondary | ICD-10-CM | POA: Diagnosis not present

## 2018-12-05 DIAGNOSIS — R2681 Unsteadiness on feet: Secondary | ICD-10-CM | POA: Diagnosis not present
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
## 2018-12-10 ENCOUNTER — Ambulatory Visit: Payer: Medicare Other | Admitting: Speech Pathology

## 2018-12-10 ENCOUNTER — Encounter: Payer: Self-pay | Admitting: Physical Therapy

## 2018-12-10 ENCOUNTER — Ambulatory Visit: Payer: Medicare Other | Admitting: Physical Therapy

## 2018-12-10 ENCOUNTER — Other Ambulatory Visit: Payer: Self-pay

## 2018-12-10 DIAGNOSIS — R2689 Other abnormalities of gait and mobility: Secondary | ICD-10-CM

## 2018-12-10 DIAGNOSIS — R2681 Unsteadiness on feet: Secondary | ICD-10-CM

## 2018-12-10 DIAGNOSIS — R490 Dysphonia: Secondary | ICD-10-CM | POA: Diagnosis not present

## 2018-12-10 NOTE — Patient Instructions (Signed)
Access Code: [email or web address]  URL: [email or web address]  Date: 12/10/2018  Prepared by: Blanche East   Exercises  Standing Hip Abduction with Resistance at Ankles and Counter Support - 15 reps - 2 sets - 1x daily - 7x weekly  Standing Hip Flexion with Resistance Loop - 15 reps - 2 sets - 1x daily - 7x weekly  Standing Hip Extension with Resistance at Ankles and Unilateral Counter Support - 15 reps - 2 sets - 1x daily - 7x weekly  Standing ITB Stretch - 3 reps - 3 sets - 20 hold - 1x daily - 7x weekly

## 2018-12-10 NOTE — Therapy (Signed)
[place] MAIN [facility] SERVICES [address] [place], [place], [address] Phone: [phone]   Fax:  [phone]  Physical Therapy Treatment  Patient Details  Name: Audrey Martinez MRN: [id number] Date of Birth: 11/10/1945 Referring Provider (PT): laura burgland   Encounter Date: 12/10/2018  PT End of Session - 12/10/18 1018    Visit Number  4    Number of Visits  16    Date for PT Re-Evaluation  01/18/19    Authorization Type  eval 6/26    PT Start Time  1014    PT Stop Time  1100    PT Time Calculation (min)  46 min    Equipment Utilized During Treatment  Gait belt    Activity Tolerance  Patient tolerated treatment well    Behavior During Therapy  WFL for tasks assessed/performed       Past Medical History:  Diagnosis Date  . Arthritis   . Asthma   . GERD (gastroesophageal reflux disease)   . Herniated disc, cervical   . Left wrist fracture   . Stroke ([place]) 1998  . SVT (supraventricular tachycardia) (HCC)     Past Surgical History:  Procedure Laterality Date  . BACK SURGERY  2017   L2-4 laminectomy and foraminal stenosis  . BREAST CYST ASPIRATION Left 1977  . CARDIAC CATHETERIZATION  2009   [facility] in [place]  . CARDIAC ELECTROPHYSIOLOGY STUDY AND ABLATION  2009  . CATARACT EXTRACTION, BILATERAL  2010  . COLONOSCOPY WITH PROPOFOL N/A 03/21/2018   Procedure: COLONOSCOPY WITH Biopsy;  Surgeon: Lin Landsman, MD;  Location: [place];  Service: Endoscopy;  Laterality: N/A;  . derrick procedure  1977-78   bilateral   . ESOPHAGOGASTRODUODENOSCOPY  2013   gastritis; done for complaint of dysphagia  . ESOPHAGOGASTRODUODENOSCOPY (EGD) WITH PROPOFOL N/A 03/21/2018   Procedure: ESOPHAGOGASTRODUODENOSCOPY (EGD) WITH Biopsies;  Surgeon: Lin Landsman, MD;  Location: [place];  Service: Endoscopy;  Laterality: N/A;  KEEP THIS PATIENT FIRST  . lymph node removal     neck  .  MOHS SURGERY  07/2016   BCCA nose  . POLYPECTOMY N/A 03/21/2018   Procedure: POLYPECTOMY;  Surgeon: Lin Landsman, MD;  Location: [place];  Service: Endoscopy;  Laterality: N/A;  . REPLACEMENT TOTAL KNEE  2013   RT  . TOTAL SHOULDER REPLACEMENT  2012   RT     There were no vitals filed for this visit.  Subjective Assessment - 12/10/18 1017    Subjective  Patient reports adherence with HEP; Reports that her balance is getting some better. She denies any new falls. She presents to therapy with [facility] which she states she brought as a precaution;    Pertinent History  Patient is a pleasant 73 year female who presents for gait instability. Patient utilizes cane intermittently since her balance has degraded over the past year. She reports she has a worsening of balance when she lost her dog/her dog was stolen.  [facility] ([facility]) due to increased pain. Left leg occasioanlly goes numb/gives way due to back surgeries. Patient's PMH includes SVT, stroke, herniated discs, GERD, asthma, Rt shoulder replacement, Rt knee replacement, and l2-4 laminectomy.    Limitations  Sitting;Lifting;Standing;Walking;House hold activities;Other (comment)    How long can you sit comfortably?  has back pain with prolonged sitting    How long can you stand comfortably?  needs to hold onto  something, 5 minutes    How long can you walk comfortably?  legs go numb from back occasionally    Patient Stated Goals  increase strength to walk up incline to mailbox.    Currently in Pain?  No/denies    Pain Onset  In the past 7 days          TREATMENT: Warm up on Nustep LUE only and BLE x4 min level 2, (unbilled);               Patient instructed in advanced balance exercise  Standing on airex foam: -alternate toe taps to 4 inch step with 0 rail assist x15 reps bilaterally; -Standing one foot on airex, one foot on 4 inch step, Single UE ball pass side/side x5  reps each foot on step -modified tandem stance: head turns side/side, up/down x5 reps each foot in front; -modified tandem stance eyes open/closed 10 sec hold x3 reps each; Patient required min VCs for balance stability, including to increase trunk control for less loss of balance with smaller base of support  Forward lunges to BOSU with 1-0 rail assist x10 reps each LE  Stepping over orange hurdle and weaving around 5 cones and then stepping over orange hurdle, unsupported x2 sets;  Side stepping over orange hurdle and side stepping over 5 cones x1 set each direction;   Advanced HEP: Standing with red tband around BLE: -hip abduction x15 reps bilaterally; -hip flexion SLR x15 reps bilaterally; -hip extension x15 reps bilaterally; Patient required min-moderate verbal/tactile cues for correct exercise technique including cues to avoid trunk lean and to improve hip/knee extension of stance leg for better stance control;  Instructed patient in standing IT band stretch, but patient unable to tolerate due to low back pain;   Response to treatment: Patient required short sitting rest breaks intermittently due to fatigue. Instructed patient in advanced balance tasks, utilizing LUE for ball pass as patient is unable to move RUE due to reverse total shoulder replacement. Patient complained of increased weakness and discomfort in lateral hip; Advanced HEP with standing tband exercise for better hip strengthening. Attempted IT Band stretch but patient unable to tolerate. Patient does require cues for correct exercise technique. She reports mild fatigue at end of session;                           PT Education - 12/10/18 1017    Education Details  HEP reinforced, balance/gait safety;    Person(s) Educated  Patient    Methods  Explanation;Verbal cues    Comprehension  Verbalized understanding;Returned demonstration;Verbal cues required;Need further instruction       PT  Short Term Goals - 11/23/18 1201      PT SHORT TERM GOAL #1   Title  Patient will be independent in home exercise program to improve strength/mobility for better functional independence with ADLs.    Baseline  6/26: given    Time  2    Period  Weeks    Status  New    Target Date  12/07/18        PT Long Term Goals - 11/23/18 1202      PT LONG TERM GOAL #1   Title  Patient will increase dynamic gait index score to >19/24 as to demonstrate reduced fall risk and improved dynamic gait balance for better safety with community/home ambulation.    Baseline  6/26: perform next session    Time  8  Period  Weeks    Status  New    Target Date  01/18/19      PT LONG TERM GOAL #2   Title  Patient (> 67 years old) will complete five times sit to stand test in < 15 seconds without UE support indicating an increased LE strength and improved balance.    Baseline  6/26: 23 seconds with excessive BUE support    Time  8    Period  Weeks    Status  New    Target Date  01/18/19      PT LONG TERM GOAL #3   Title  Patient will increase Berg Balance score by > 6 points (45/56)  to demonstrate decreased fall risk during functional activities.    Baseline  6/26: 39/56    Time  8    Period  Weeks    Status  New    Target Date  01/18/19      PT LONG TERM GOAL #4   Title  Patient will increase ABC scale score >80% to demonstrate better functional mobility and better confidence with ADLs.    Baseline  6/26: 51.25%    Time  8    Period  Weeks    Status  New    Target Date  01/18/19      PT LONG TERM GOAL #5   Title  Patient will be modified independent in walking on even/uneven surface with least restrictive assistive device, for 20+ minutes without rest break, reporting some difficulty or less to improve walking tolerance with community ambulation including grocery shopping, going to church,etc.    Baseline  6/26: 10 minutes    Time  8    Period  Weeks    Status  New    Target Date  01/18/19             Plan - 12/11/18 1553    Clinical Impression Statement  Patient motivated and participated well within session. She was instructed in advanced balance exercise utilizing compliant surfaces to challenge stance control; progressed exercise with less rail assist. Patient does exhibit slight instability in LE which is likely related to hip weakness. Advanced HEP with instruction in tband exercise. Attempted to give patient an IT band stretch to reduce soreness in lateral hip but patient unable to tolerate due to back discomfort.She would benefit from additional skilled PT intervention to improve strength, balance and gait safety;    Personal Factors and Comorbidities  Age;Comorbidity 3+;Fitness;Past/Current Experience;Social Background;Time since onset of injury/illness/exacerbation    Comorbidities  SVT, stroke, herniated discs, GERD, asthma, reverse rt shoulder replacement, L knee replacement    Examination-Activity Limitations  Bend;Caring for Others;Hygiene/Grooming;Lift;Locomotion Level;Reach Overhead;Sit;Squat;Stairs;Stand;Toileting;Transfers    Examination-Participation Restrictions  Church;Community Activity;Driving;Interpersonal Relationship;Laundry;Meal Prep;Shop;Volunteer;Yard Work    [employer] officer  Evolving/Moderate complexity    Rehab Potential  Fair    PT Frequency  2x / week    PT Duration  8 weeks    PT Treatment/Interventions  ADLs/Self Care Home Management;Aquatic Therapy;Cryotherapy;Electrical Stimulation;Iontophoresis 4mg /ml Dexamethasone;Moist Heat;Traction;Ultrasound;Functional mobility training;Stair training;Gait training;DME Instruction;Therapeutic activities;Therapeutic exercise;Balance training;Neuromuscular re-education;Manual techniques;Patient/family education;Compression bandaging;Passive range of motion;Dry needling;Taping;Energy conservation    PT Next Visit Plan  review HEP, standing dynamic stability, DGI    PT Home Exercise Plan  see  above       Patient will benefit from skilled therapeutic intervention in order to improve the following deficits and impairments:  Abnormal gait, Decreased activity tolerance, Decreased balance, Decreased endurance, Decreased coordination, Decreased cognition,  Decreased mobility, Decreased safety awareness, Difficulty walking, Decreased strength, Dizziness, Impaired flexibility, Impaired perceived functional ability, Impaired UE functional use, Postural dysfunction, Improper body mechanics, Pain  Visit Diagnosis: 1. Other abnormalities of gait and mobility   2. Unsteadiness on feet        Problem List Patient Active Problem List   Diagnosis Date Noted  . Colon cancer screening   . Arm pain, right 11/08/2017  . Renal insufficiency 11/08/2017  . Tendinitis of right knee 07/03/2017  . Lumbar radiculopathy 05/18/2017  . Status post hemilaminotomy 05/18/2017  . Lumbar degenerative disc disease 05/18/2017  . Spinal stenosis of lumbar region without neurogenic claudication 05/18/2017  . Atrial tachycardia ([place]) 04/14/2017  . SVT (supraventricular tachycardia) ([place]) 04/14/2017  . Essential hypertension 04/14/2017  . Intermittent asthma without complication 09/64/3838  . Osteoporosis 03/21/2017  . Benign essential tremor 03/21/2017  . Obesity 09/14/2011  . Hyperlipidemia 05/12/2003  . Personal history of transient ischemic attack (TIA), and cerebral infarction without residual deficits 04/22/2003    Darold Miley PT, DPT 12/11/2018, 3:56 PM  [place] MAIN [facility] SERVICES [address] [place], [place], [address] Phone: [phone]   Fax:  [phone]  Name: Audrey Martinez MRN: [id number] Date of Birth: December 06, 1945

## 2018-12-12 ENCOUNTER — Ambulatory Visit: Payer: Medicare Other | Admitting: Speech Pathology

## 2018-12-14 ENCOUNTER — Other Ambulatory Visit: Payer: Self-pay

## 2018-12-14 ENCOUNTER — Ambulatory Visit: Payer: Medicare Other

## 2018-12-14 DIAGNOSIS — R2689 Other abnormalities of gait and mobility: Secondary | ICD-10-CM

## 2018-12-14 DIAGNOSIS — R490 Dysphonia: Secondary | ICD-10-CM | POA: Diagnosis not present

## 2018-12-14 DIAGNOSIS — R2681 Unsteadiness on feet: Secondary | ICD-10-CM

## 2018-12-14 NOTE — Therapy (Signed)
[place] MAIN [facility] SERVICES [address] [place], [place], [address] Phone: [phone]   Fax:  [phone]  Physical Therapy Treatment  Patient Details  Name: Audrey Martinez MRN: [id number] Date of Birth: [birth year] Referring Provider (PT): laura burgland   Encounter Date: 12/14/2018  PT End of Session - 12/14/18 0953    Visit Number  5    Number of Visits  16    Date for PT Re-Evaluation  01/18/19    Authorization Type  eval 6/26    PT Start Time  0958    PT Stop Time  1044    PT Time Calculation (min)  46 min    Equipment Utilized During Treatment  Gait belt    Activity Tolerance  Patient tolerated treatment well    Behavior During Therapy  [facility] for tasks assessed/performed       Past Medical History:  Diagnosis Date  . Arthritis   . Asthma   . GERD (gastroesophageal reflux disease)   . Herniated disc, cervical   . Left wrist fracture   . Stroke ([place]) 1998  . SVT (supraventricular tachycardia) (HCC)     Past Surgical History:  Procedure Laterality Date  . BACK SURGERY  2017   L2-4 laminectomy and foraminal stenosis  . BREAST CYST ASPIRATION Left 1977  . CARDIAC CATHETERIZATION  2009   [facility] in [place]  . CARDIAC ELECTROPHYSIOLOGY STUDY AND ABLATION  2009  . CATARACT EXTRACTION, BILATERAL  2010  . COLONOSCOPY WITH PROPOFOL N/A 03/21/2018   Procedure: COLONOSCOPY WITH Biopsy;  Surgeon: Lin Landsman, MD;  Location: [place];  Service: Endoscopy;  Laterality: N/A;  . derrick procedure  1977-78   bilateral   . ESOPHAGOGASTRODUODENOSCOPY  2013   gastritis; done for complaint of dysphagia  . ESOPHAGOGASTRODUODENOSCOPY (EGD) WITH PROPOFOL N/A 03/21/2018   Procedure: ESOPHAGOGASTRODUODENOSCOPY (EGD) WITH Biopsies;  Surgeon: Lin Landsman, MD;  Location: [place];  Service: Endoscopy;  Laterality: N/A;  KEEP THIS PATIENT FIRST  . lymph node removal     neck  .  MOHS SURGERY  07/2016   BCCA nose  . POLYPECTOMY N/A 03/21/2018   Procedure: POLYPECTOMY;  Surgeon: Lin Landsman, MD;  Location: [place];  Service: Endoscopy;  Laterality: N/A;  . REPLACEMENT TOTAL KNEE  2013   RT  . TOTAL SHOULDER REPLACEMENT  2012   RT     There were no vitals filed for this visit.  Subjective Assessment - 12/14/18 0959    Subjective  Patient reports she was weedwacking yesterday and has aggrevated symptoms. Has been compliant with HEP. No falls or LOB during this task.    Pertinent History  Patient is a pleasant 73 year female who presents for gait instability. Patient utilizes cane intermittently since her balance has degraded over the past year. She reports she has a worsening of balance when she lost her dog/her dog was stolen.  [facility] is [facility] back to [facility] for R shoulder (reverse) due to increased pain. Left leg occasioanlly goes numb/gives way due to back surgeries. Patient's PMH includes SVT, stroke, herniated discs, GERD, asthma, Rt shoulder replacement, Rt knee replacement, and l2-4 laminectomy.    Limitations  Sitting;Lifting;Standing;Walking;House hold activities;Other (comment)    How long can you sit comfortably?  has back pain with prolonged sitting    How long can you stand comfortably?  needs to hold onto something, 5 minutes    How long can you  walk comfortably?  legs go numb from back occasionally    Patient Stated Goals  increase strength to walk up incline to mailbox.    Currently in Pain?  No/denies         Treatment:  Nustep Lvl 2 LE only RPM >60 for cardiovascular challenge and stability   RTB monster walks/cowboy walks with slight knee flexion/squat position 4x length of // bars ; cueing for body mechanics and squencing  Seated adduction squeezing ball between feet with LAQ  10x each LE   Patient required min-moderate verbal/tactile cues for correct exercise technique including cues to avoid trunk lean and to  improve hip/knee extension of stance leg for better stance control;  Neuro Re-ed  Standing on airex foam: -eyes closed 3x30 seconds, trunk/ankle reactions noted, improving with each repetition -modified tandem stance: 30 second holds each LE, 2 sets each LE, 4x total  Red mat on floor of // bars with CGA  To promote stability on unstable surfaces such as a grassy surface-TrA contraction cueing and heel strike for optimal positioning x8 trials  Red mat on floor of // bars with CGA, hedhehogs under for mimicking grass outside patient's home x6 trials with cueing for ensuring foot is on solid surface prior to weight shift   Step over orange hurdle and back 10x each LE. Slight lunge forward upon initial touch down.   Patient required min VCs for balance stability, including to increase trunk control for less loss of balance with smaller base of support                       PT Education - 12/14/18 0952    Education Details  HEP reinforced,exercise technique/stability    Person(s) Educated  Patient    Methods  Explanation;Demonstration;Tactile cues;Verbal cues    Comprehension  Verbalized understanding;Returned demonstration;Verbal cues required;Tactile cues required       PT Short Term Goals - 11/23/18 1201      PT SHORT TERM GOAL #1   Title  Patient will be independent in home exercise program to improve strength/mobility for better functional independence with ADLs.    Baseline  6/26: given    Time  2    Period  Weeks    Status  New    Target Date  12/07/18        PT Long Term Goals - 11/23/18 1202      PT LONG TERM GOAL #1   Title  Patient will increase dynamic gait index score to >19/24 as to demonstrate reduced fall risk and improved dynamic gait balance for better safety with community/home ambulation.    Baseline  6/26: perform next session    Time  8    Period  Weeks    Status  New    Target Date  01/18/19      PT LONG TERM GOAL #2   Title   Patient (> 72 years old) will complete five times sit to stand test in < 15 seconds without UE support indicating an increased LE strength and improved balance.    Baseline  6/26: 23 seconds with excessive BUE support    Time  8    Period  Weeks    Status  New    Target Date  01/18/19      PT LONG TERM GOAL #3   Title  Patient will increase Berg Balance score by > 6 points (45/56)  to demonstrate decreased fall risk during functional  activities.    Baseline  6/26: 39/56    Time  8    Period  Weeks    Status  New    Target Date  01/18/19      PT LONG TERM GOAL #4   Title  Patient will increase ABC scale score >80% to demonstrate better functional mobility and better confidence with ADLs.    Baseline  6/26: 51.25%    Time  8    Period  Weeks    Status  New    Target Date  01/18/19      PT LONG TERM GOAL #5   Title  Patient will be modified independent in walking on even/uneven surface with least restrictive assistive device, for 20+ minutes without rest break, reporting some difficulty or less to improve walking tolerance with community ambulation including grocery shopping, going to church,etc.    Baseline  6/26: 10 minutes    Time  8    Period  Weeks    Status  New    Target Date  01/18/19            Plan - 12/14/18 1152    Clinical Impression Statement  Patient is verbose throughout session requiring occasional cueing for task orientation for optimal performance. Patient's LLE occasionally painful throughout session requiring education on proper body mechanics and reduction of excessive extension. Patient continues to be challenged with single limb stance at this time. She would benefit from additional skilled PT intervention to improve strength, balance and gait safety.    Personal Factors and Comorbidities  Age;Comorbidity 3+;Fitness;Past/Current Experience;Social Background;Time since onset of injury/illness/exacerbation    Comorbidities  SVT, stroke, herniated discs,  GERD, asthma, reverse rt shoulder replacement, L knee replacement    Examination-Activity Limitations  Bend;Caring for Others;Hygiene/Grooming;Lift;Locomotion Level;Reach Overhead;Sit;Squat;Stairs;Stand;Toileting;Transfers    Examination-Participation Restrictions  Church;Community Activity;Driving;Interpersonal Relationship;Laundry;Meal Prep;Shop;Volunteer;Yard Work    [employer]chant [employer] officer  Evolving/Moderate complexity    Rehab Potential  Fair    PT Frequency  2x / week    PT Duration  8 weeks    PT Treatment/Interventions  ADLs/Self Care Home Management;Aquatic Therapy;Cryotherapy;Electrical Stimulation;Iontophoresis 4mg /ml Dexamethasone;Moist Heat;Traction;Ultrasound;Functional mobility training;Stair training;Gait training;DME Instruction;Therapeutic activities;Therapeutic exercise;Balance training;Neuromuscular re-education;Manual techniques;Patient/family education;Compression bandaging;Passive range of motion;Dry needling;Taping;Energy conservation    PT Next Visit Plan  review HEP, standing dynamic stability, DGI    PT Home Exercise Plan  see above       Patient will benefit from skilled therapeutic intervention in order to improve the following deficits and impairments:  Abnormal gait, Decreased activity tolerance, Decreased balance, Decreased endurance, Decreased coordination, Decreased cognition, Decreased mobility, Decreased safety awareness, Difficulty walking, Decreased strength, Dizziness, Impaired flexibility, Impaired perceived functional ability, Impaired UE functional use, Postural dysfunction, Improper body mechanics, Pain  Visit Diagnosis: 1. Other abnormalities of gait and mobility   2. Unsteadiness on feet        Problem List Patient Active Problem List   Diagnosis Date Noted  . Colon cancer screening   . Arm pain, right 11/08/2017  . Renal insufficiency 11/08/2017  . Tendinitis of right knee 07/03/2017  . Lumbar radiculopathy 05/18/2017  . Status  post hemilaminotomy 05/18/2017  . Lumbar degenerative disc disease 05/18/2017  . Spinal stenosis of lumbar region without neurogenic claudication 05/18/2017  . Atrial tachycardia ([place]) 04/14/2017  . SVT (supraventricular tachycardia) ([place]) 04/14/2017  . Essential hypertension 04/14/2017  . Intermittent asthma without complication 30/11/6224  . Osteoporosis 03/21/2017  . Benign essential tremor 03/21/2017  . Obesity 09/14/2011  . Hyperlipidemia  05/12/2003  . Personal history of transient ischemic attack (TIA), and cerebral infarction without residual deficits 04/22/2003   Janna Arch, PT, DPT   12/14/2018, 11:53 AM  [place] MAIN [facility] SERVICES [address] [place], [place], [address] Phone: [phone]   Fax:  [phone]  Name: Audrey Martinez MRN: [id number] Date of Birth: 12/30/1945

## 2018-12-15 ENCOUNTER — Other Ambulatory Visit: Payer: Self-pay | Admitting: Internal Medicine

## 2018-12-17 ENCOUNTER — Ambulatory Visit: Payer: Medicare Other | Admitting: Speech Pathology

## 2018-12-17 ENCOUNTER — Ambulatory Visit: Payer: Medicare Other

## 2018-12-19 ENCOUNTER — Ambulatory Visit: Payer: Medicare Other | Admitting: Speech Pathology

## 2018-12-21 ENCOUNTER — Ambulatory Visit: Payer: Medicare Other

## 2018-12-21 ENCOUNTER — Ambulatory Visit: Payer: Medicare Other | Admitting: Speech Pathology

## 2018-12-21 ENCOUNTER — Other Ambulatory Visit: Payer: Self-pay

## 2018-12-21 DIAGNOSIS — R2689 Other abnormalities of gait and mobility: Secondary | ICD-10-CM | POA: Diagnosis not present

## 2018-12-21 DIAGNOSIS — R2681 Unsteadiness on feet: Secondary | ICD-10-CM

## 2018-12-21 DIAGNOSIS — R490 Dysphonia: Secondary | ICD-10-CM | POA: Diagnosis not present

## 2018-12-21 NOTE — Therapy (Signed)
[place] MAIN [facility] SERVICES [address] [place], [place], [address] Phone: [phone]   Fax:  [phone]  Physical Therapy Treatment  Patient Details  Name: Audrey Martinez MRN: [id number] Date of Birth: 07-09-1945 Referring Provider (PT): laura burgland   Encounter Date: 12/21/2018  PT End of Session - 12/21/18 1009    Visit Number  6    Number of Visits  16    Date for PT Re-Evaluation  01/18/19    Authorization Type  eval 6/26    PT Start Time  1000    PT Stop Time  1044    PT Time Calculation (min)  44 min    Equipment Utilized During Treatment  Gait belt    Activity Tolerance  Patient tolerated treatment well    Behavior During Therapy  WFL for tasks assessed/performed       Past Medical History:  Diagnosis Date  . Arthritis   . Asthma   . GERD (gastroesophageal reflux disease)   . Herniated disc, cervical   . Left wrist fracture   . Stroke ([place]) 1998  . SVT (supraventricular tachycardia) (HCC)     Past Surgical History:  Procedure Laterality Date  . BACK SURGERY  2017   L2-4 laminectomy and foraminal stenosis  . BREAST CYST ASPIRATION Left 1977  . CARDIAC CATHETERIZATION  2009   [facility] in [place]  . CARDIAC ELECTROPHYSIOLOGY STUDY AND ABLATION  2009  . CATARACT EXTRACTION, BILATERAL  2010  . COLONOSCOPY WITH PROPOFOL N/A 03/21/2018   Procedure: COLONOSCOPY WITH Biopsy;  Surgeon: Lin Landsman, MD;  Location: [place];  Service: Endoscopy;  Laterality: N/A;  . derrick procedure  1977-78   bilateral   . ESOPHAGOGASTRODUODENOSCOPY  2013   gastritis; done for complaint of dysphagia  . ESOPHAGOGASTRODUODENOSCOPY (EGD) WITH PROPOFOL N/A 03/21/2018   Procedure: ESOPHAGOGASTRODUODENOSCOPY (EGD) WITH Biopsies;  Surgeon: Lin Landsman, MD;  Location: [place];  Service: Endoscopy;  Laterality: N/A;  KEEP THIS PATIENT FIRST  . lymph node removal     neck  .  MOHS SURGERY  07/2016   BCCA nose  . POLYPECTOMY N/A 03/21/2018   Procedure: POLYPECTOMY;  Surgeon: Lin Landsman, MD;  Location: [place];  Service: Endoscopy;  Laterality: N/A;  . REPLACEMENT TOTAL KNEE  2013   RT  . TOTAL SHOULDER REPLACEMENT  2012   RT     There were no vitals filed for this visit.  Subjective Assessment - 12/21/18 1007    Subjective  Patient's small head of her bicep tore Tuesday night. Went to [facility] who told her she doesn't need surgery for it. Patient reports not doing much exercise due to this and the pain.    Pertinent History  Patient is a pleasant 73 year female who presents for gait instability. Patient utilizes cane intermittently since her balance has degraded over the past year. She reports she has a worsening of balance when she lost her dog/her dog was stolen.  [facility] due to increased pain. Left leg occasioanlly goes numb/gives way due to back surgeries. Patient's PMH includes SVT, stroke, herniated discs, GERD, asthma, Rt shoulder replacement, Rt knee replacement, and l2-4 laminectomy.    Limitations  Sitting;Lifting;Standing;Walking;House hold activities;Other (comment)    How long can you sit comfortably?  has back pain with prolonged sitting    How long can you stand comfortably?  needs to hold  onto something, 5 minutes    How long can you walk comfortably?  legs go numb from back occasionally    Patient Stated Goals  increase strength to walk up incline to mailbox.    Currently in Pain?  Yes    Pain Score  2     Pain Location  Shoulder    Pain Orientation  Right    Pain Descriptors / Indicators  Aching;Stabbing    Pain Type  Acute pain    Pain Onset  In the past 7 days    Pain Frequency  Constant    Aggravating Factors   movement    Pain Relieving Factors  rest       Nustep Lvl 2 LE only RPM >60 for cardiovascular challenge and stability    3lb ankle weights:   -hip  extensions 10x with focus on gluteal activation.   -LAQ seated; terminated due to increased HR   Seated adduction squeezing ball between feet with LAQ  10x each LE    Patient required min-moderate verbal/tactile cues for correct exercise technique including cues to avoid trunk lean and to improve hip/knee extension of stance leg for better stance control;   Neuro Re-ed   Standing on airex foam: -eyes closed 3x30 seconds, trunk/ankle reactions noted, improving with each repetition -modified tandem stance: 30 second holds each LE, 2 sets each LE, 4x total -one foot on airex pad one foot on 6" step 30 seconds each LE back  airex pad 6" step toe taps no UE support. 10x each LE  Step over orange hurdle and back 10x each LE. Slight lunge forward upon initial touch down.    Patient required min VCs for balance stability, including to increase trunk control for less loss of balance with smaller base of support   Patient had episode of  HR elevated to 120 with dizziness during LAQ seated exercise, purse lipped breathing for reduction of dizziness, patient returnted to normal within 2 minutes. Patient declined BP to be taken, declined calling doctor. Reports she is back at baseline.                        PT Education - 12/21/18 1008    Education Details  exercise technique, stability    Person(s) Educated  Patient    Methods  Explanation;Demonstration;Tactile cues;Verbal cues    Comprehension  Verbalized understanding;Returned demonstration;Verbal cues required;Tactile cues required;Need further instruction       PT Short Term Goals - 11/23/18 1201      PT SHORT TERM GOAL #1   Title  Patient will be independent in home exercise program to improve strength/mobility for better functional independence with ADLs.    Baseline  6/26: given    Time  2    Period  Weeks    Status  New    Target Date  12/07/18        PT Long Term Goals - 11/23/18 1202      PT LONG TERM  GOAL #1   Title  Patient will increase dynamic gait index score to >19/24 as to demonstrate reduced fall risk and improved dynamic gait balance for better safety with community/home ambulation.    Baseline  6/26: perform next session    Time  8    Period  Weeks    Status  New    Target Date  01/18/19      PT LONG TERM GOAL #2   Title  Patient (> 31 years old) will complete five times sit to stand test in < 15 seconds without UE support indicating an increased LE strength and improved balance.    Baseline  6/26: 23 seconds with excessive BUE support    Time  8    Period  Weeks    Status  New    Target Date  01/18/19      PT LONG TERM GOAL #3   Title  Patient will increase Berg Balance score by > 6 points (45/56)  to demonstrate decreased fall risk during functional activities.    Baseline  6/26: 39/56    Time  8    Period  Weeks    Status  New    Target Date  01/18/19      PT LONG TERM GOAL #4   Title  Patient will increase ABC scale score >80% to demonstrate better functional mobility and better confidence with ADLs.    Baseline  6/26: 51.25%    Time  8    Period  Weeks    Status  New    Target Date  01/18/19      PT LONG TERM GOAL #5   Title  Patient will be modified independent in walking on even/uneven surface with least restrictive assistive device, for 20+ minutes without rest break, reporting some difficulty or less to improve walking tolerance with community ambulation including grocery shopping, going to church,etc.    Baseline  6/26: 10 minutes    Time  8    Period  Weeks    Status  New    Target Date  01/18/19            Plan - 12/21/18 1149    Clinical Impression Statement  Patient presents with new injury (bicep release)  To today's session. Her verbose and tangential nature required frequent cueing for task orientation. Patient had episode of  HR elevated to 120 with dizziness during LAQ seated exercise, purse lipped breathing for reduction of dizziness,  patient returnted to normal within 2 minutes. Patient declined BP to be taken, declined calling doctor. Reports she is back at baseline. She would benefit from additional skilled PT intervention to improve strength, balance and gait safety.    Personal Factors and Comorbidities  Age;Comorbidity 3+;Fitness;Past/Current Experience;Social Background;Time since onset of injury/illness/exacerbation    Comorbidities  SVT, stroke, herniated discs, GERD, asthma, reverse rt shoulder replacement, L knee replacement    Examination-Activity Limitations  Bend;Caring for Others;Hygiene/Grooming;Lift;Locomotion Level;Reach Overhead;Sit;Squat;Stairs;Stand;Toileting;Transfers    Examination-Participation Restrictions  Church;Community Activity;Driving;Interpersonal Relationship;Laundry;Meal Prep;Shop;Volunteer;Yard Work    [employer]chant [employer] officer  Evolving/Moderate complexity    Rehab Potential  Fair    PT Frequency  2x / week    PT Duration  8 weeks    PT Treatment/Interventions  ADLs/Self Care Home Management;Aquatic Therapy;Cryotherapy;Electrical Stimulation;Iontophoresis 4mg /ml Dexamethasone;Moist Heat;Traction;Ultrasound;Functional mobility training;Stair training;Gait training;DME Instruction;Therapeutic activities;Therapeutic exercise;Balance training;Neuromuscular re-education;Manual techniques;Patient/family education;Compression bandaging;Passive range of motion;Dry needling;Taping;Energy conservation    PT Next Visit Plan  review HEP, standing dynamic stability, DGI    PT Home Exercise Plan  see above       Patient will benefit from skilled therapeutic intervention in order to improve the following deficits and impairments:  Abnormal gait, Decreased activity tolerance, Decreased balance, Decreased endurance, Decreased coordination, Decreased cognition, Decreased mobility, Decreased safety awareness, Difficulty walking, Decreased strength, Dizziness, Impaired flexibility, Impaired perceived  functional ability, Impaired UE functional use, Postural dysfunction, Improper body mechanics, Pain  Visit Diagnosis: 1. Other abnormalities of gait  and mobility   2. Unsteadiness on feet        Problem List Patient Active Problem List   Diagnosis Date Noted  . Colon cancer screening   . Arm pain, right 11/08/2017  . Renal insufficiency 11/08/2017  . Tendinitis of right knee 07/03/2017  . Lumbar radiculopathy 05/18/2017  . Status post hemilaminotomy 05/18/2017  . Lumbar degenerative disc disease 05/18/2017  . Spinal stenosis of lumbar region without neurogenic claudication 05/18/2017  . Atrial tachycardia ([place]) 04/14/2017  . SVT (supraventricular tachycardia) ([place]) 04/14/2017  . Essential hypertension 04/14/2017  . Intermittent asthma without complication 57/97/2820  . Osteoporosis 03/21/2017  . Benign essential tremor 03/21/2017  . Obesity 09/14/2011  . Hyperlipidemia 05/12/2003  . Personal history of transient ischemic attack (TIA), and cerebral infarction without residual deficits 04/22/2003   Janna Arch, PT, DPT   12/21/2018, 11:52 AM  [place] MAIN [facility] SERVICES [address] [place], [place], [address] Phone: [phone]   Fax:  [phone]  Name: Audrey Martinez MRN: [id number] Date of Birth: 07/16/1945

## 2018-12-24 ENCOUNTER — Ambulatory Visit: Payer: Medicare Other | Admitting: Speech Pathology

## 2018-12-26 ENCOUNTER — Other Ambulatory Visit: Payer: Self-pay

## 2018-12-26 ENCOUNTER — Ambulatory Visit: Payer: Medicare Other | Admitting: Physical Therapy

## 2018-12-26 ENCOUNTER — Encounter: Payer: Self-pay | Admitting: Physical Therapy

## 2018-12-26 ENCOUNTER — Ambulatory Visit: Payer: Medicare Other | Admitting: Speech Pathology

## 2018-12-26 DIAGNOSIS — R2681 Unsteadiness on feet: Secondary | ICD-10-CM | POA: Diagnosis not present

## 2018-12-26 DIAGNOSIS — R2689 Other abnormalities of gait and mobility: Secondary | ICD-10-CM | POA: Diagnosis not present

## 2018-12-26 DIAGNOSIS — R490 Dysphonia: Secondary | ICD-10-CM | POA: Diagnosis not present

## 2018-12-26 NOTE — Therapy (Signed)
[place] MAIN [facility] SERVICES [address] [place], [place], [address] Phone: [phone]   Fax:  [phone]  Physical Therapy Treatment  Patient Details  Name: Audrey Martinez MRN: [id number] Date of Birth: 08-Dec-1945 Referring Provider (PT): laura burgland   Encounter Date: 12/26/2018  PT End of Session - 12/26/18 1011    Visit Number  7    Number of Visits  16    Date for PT Re-Evaluation  01/18/19    Authorization Type  eval 6/26    PT Start Time  1015    PT Stop Time  1100    PT Time Calculation (min)  45 min    Equipment Utilized During Treatment  Gait belt    Activity Tolerance  Patient tolerated treatment well    Behavior During Therapy  WFL for tasks assessed/performed       Past Medical History:  Diagnosis Date  . Arthritis   . Asthma   . GERD (gastroesophageal reflux disease)   . Herniated disc, cervical   . Left wrist fracture   . Stroke ([place]) 1998  . SVT (supraventricular tachycardia) (HCC)     Past Surgical History:  Procedure Laterality Date  . BACK SURGERY  2017   L2-4 laminectomy and foraminal stenosis  . BREAST CYST ASPIRATION Left 1977  . CARDIAC CATHETERIZATION  2009   [facility] in [place]  . CARDIAC ELECTROPHYSIOLOGY STUDY AND ABLATION  2009  . CATARACT EXTRACTION, BILATERAL  2010  . COLONOSCOPY WITH PROPOFOL N/A 03/21/2018   Procedure: COLONOSCOPY WITH Biopsy;  Surgeon: Lin Landsman, MD;  Location: [place];  Service: Endoscopy;  Laterality: N/A;  . derrick procedure  1977-78   bilateral   . ESOPHAGOGASTRODUODENOSCOPY  2013   gastritis; done for complaint of dysphagia  . ESOPHAGOGASTRODUODENOSCOPY (EGD) WITH PROPOFOL N/A 03/21/2018   Procedure: ESOPHAGOGASTRODUODENOSCOPY (EGD) WITH Biopsies;  Surgeon: Lin Landsman, MD;  Location: [place];  Service: Endoscopy;  Laterality: N/A;  KEEP THIS PATIENT FIRST  . lymph node removal     neck  .  MOHS SURGERY  07/2016   BCCA nose  . POLYPECTOMY N/A 03/21/2018   Procedure: POLYPECTOMY;  Surgeon: Lin Landsman, MD;  Location: [place];  Service: Endoscopy;  Laterality: N/A;  . REPLACEMENT TOTAL KNEE  2013   RT  . TOTAL SHOULDER REPLACEMENT  2012   RT     There were no vitals filed for this visit.  Subjective Assessment - 12/26/18 1018    Subjective  Patient reports having increased right shoulder discomfort which has limited overall mobility; Denies any new falls; She reports transitioning from [facility] [place] to [place] due to poor care in [place]. She reports feeling like the orthopedic in [place] is more thorough and will be addressing her shoulder pain better.    Pertinent History  Patient is a pleasant 73 year female who presents for gait instability. Patient utilizes cane intermittently since her balance has degraded over the past year. She reports she has a worsening of balance when she lost her dog/her dog was stolen.  [facility] is [facility] R [facility] (reverse) due to increased pain. Left leg occasioanlly goes numb/gives way due to back surgeries. Patient's PMH includes SVT, stroke, herniated discs, GERD, asthma, Rt shoulder replacement, Rt knee replacement, and l2-4 laminectomy.    Limitations  Sitting;Lifting;Standing;Walking;House hold activities;Other (comment)    How long can you sit comfortably?  has back pain with prolonged sitting    How long can you stand comfortably?  needs to hold onto something, 5 minutes    How long can you walk comfortably?  legs go numb from back occasionally    Patient Stated Goals  increase strength to walk up incline to mailbox.    Currently in Pain?  Yes    Pain Score  5     Pain Location  Shoulder    Pain Orientation  Right    Pain Descriptors / Indicators  Sharp    Pain Type  Acute pain    Pain Onset  In the past 7 days    Pain Frequency  Constant    Aggravating Factors   movement     Pain Relieving Factors  rest    Effect of Pain on Daily Activities  decreased activity tolerance;    Multiple Pain Sites  No               TREATMENT: Warm up on cross trainer, level 0 x3 min (BLE only) , (unbilled);   Patient instructed in advanced balance exercise  Standing on airex foam: -alternate toe taps to 4 inch step with 0rail assist x15 reps bilaterally; -Standing one foot on airex, one foot on 4 inch step,arms across chest trunk rotation x5 each direction; -modified tandem stance: head turns side/side, up/downx5 reps each foot in front; -modified tandem stance eyes open/closed 10 sec hold x2 reps each; Patient required min VCs for balance stability, including to increase trunk control for less loss of balance with smaller base of support  Forward lunges to BOSU with 1-0 rail assist x10 reps each LE  Stepping over orange hurdle #2  Without rail assist x10 reps, reciprocal stepping with cues to increase heel strike for better gait safety;   Gait outside in hallway: Forward walking with head turns up/down, side/side x80 feet each Forward/backward walking directional change x80 feet Forward walking, fast/slow speed change x80 feet Forward walking with quick pivot turn x5 turns Patient required mod VCs to increase step length and improve gait speed with advanced balance challenge. Required cues to improve gaze stabilization with head turns to reduce dizziness and reduce veering side/side.    Therapeutic exercise: Standing with red tband around BLE: -hip abduction x15 reps bilaterally; -hip flexion SLR x15 reps bilaterally; -hip extension x15 reps bilaterally; Patient required min-moderate verbal/tactile cues for correct exercise technique including cues to avoid trunk lean and to improve hip/knee extension of stance leg for better stance control;  Seated LAQ with ankle DF for hamstring stretch x10 reps AROM;  Sit<>Stand from regular chair,  unsupported x5 reps;  Response to treatment: Patient required short sitting rest breaks intermittently due to fatigue. Instructed patient in advanced balance tasks, progressing to advanced dynamic balance tasks.  Instructed patient in advanced LE strengthening exercise. She does require min VCs for correct exercise technique. She reports mild fatigue at end of session but no increase in pain;                  PT Education - 12/26/18 1010    Education Details  exercise technique, balance/gait safety, strengthening;    Person(s) Educated  Patient    Methods  Explanation;Verbal cues    Comprehension  Verbalized understanding;Returned demonstration;Verbal cues required;Need further instruction       PT Short Term Goals - 11/23/18 1201      PT SHORT TERM GOAL #1   Title  Patient will be  independent in home exercise program to improve strength/mobility for better functional independence with ADLs.    Baseline  6/26: given    Time  2    Period  Weeks    Status  New    Target Date  12/07/18        PT Long Term Goals - 11/23/18 1202      PT LONG TERM GOAL #1   Title  Patient will increase dynamic gait index score to >19/24 as to demonstrate reduced fall risk and improved dynamic gait balance for better safety with community/home ambulation.    Baseline  6/26: perform next session    Time  8    Period  Weeks    Status  New    Target Date  01/18/19      PT LONG TERM GOAL #2   Title  Patient (> 78 years old) will complete five times sit to stand test in < 15 seconds without UE support indicating an increased LE strength and improved balance.    Baseline  6/26: 23 seconds with excessive BUE support    Time  8    Period  Weeks    Status  New    Target Date  01/18/19      PT LONG TERM GOAL #3   Title  Patient will increase Berg Balance score by > 6 points (45/56)  to demonstrate decreased fall risk during functional activities.    Baseline  6/26: 39/56    Time  8     Period  Weeks    Status  New    Target Date  01/18/19      PT LONG TERM GOAL #4   Title  Patient will increase ABC scale score >80% to demonstrate better functional mobility and better confidence with ADLs.    Baseline  6/26: 51.25%    Time  8    Period  Weeks    Status  New    Target Date  01/18/19      PT LONG TERM GOAL #5   Title  Patient will be modified independent in walking on even/uneven surface with least restrictive assistive device, for 20+ minutes without rest break, reporting some difficulty or less to improve walking tolerance with community ambulation including grocery shopping, going to church,etc.    Baseline  6/26: 10 minutes    Time  8    Period  Weeks    Status  New    Target Date  01/18/19            Plan - 12/26/18 1027    Clinical Impression Statement  Patient motivated and participated well within session; She was instructed in advanced LE strengthening for better hip control. Patient does require min VCs for correct positioning for optimum muscle activation. She was instructed in advanced balance exercise, utilizing compliant surfaces to challenge ankle stability and stance control.She does require min A when standing with less rail assist;    Personal Factors and Comorbidities  Age;Comorbidity 3+;Fitness;Past/Current Experience;Social Background;Time since onset of injury/illness/exacerbation    Comorbidities  SVT, stroke, herniated discs, GERD, asthma, reverse rt shoulder replacement, L knee replacement    Examination-Activity Limitations  Bend;Caring for Others;Hygiene/Grooming;Lift;Locomotion Level;Reach Overhead;Sit;Squat;Stairs;Stand;Toileting;Transfers    Examination-Participation Restrictions  Church;Community Activity;Driving;Interpersonal Relationship;Laundry;Meal Prep;Shop;Volunteer;Yard Work    Journalist, newspaper    Rehab Potential  Fair    PT Frequency  2x / week    PT Duration  8 weeks  PT  Treatment/Interventions  ADLs/Self Care Home Management;Aquatic Therapy;Cryotherapy;Electrical Stimulation;Iontophoresis 4mg /ml Dexamethasone;Moist Heat;Traction;Ultrasound;Functional mobility training;Stair training;Gait training;DME Instruction;Therapeutic activities;Therapeutic exercise;Balance training;Neuromuscular re-education;Manual techniques;Patient/family education;Compression bandaging;Passive range of motion;Dry needling;Taping;Energy conservation    PT Next Visit Plan  review HEP, standing dynamic stability, DGI    PT Home Exercise Plan  see above       Patient will benefit from skilled therapeutic intervention in order to improve the following deficits and impairments:  Abnormal gait, Decreased activity tolerance, Decreased balance, Decreased endurance, Decreased coordination, Decreased cognition, Decreased mobility, Decreased safety awareness, Difficulty walking, Decreased strength, Dizziness, Impaired flexibility, Impaired perceived functional ability, Impaired UE functional use, Postural dysfunction, Improper body mechanics, Pain  Visit Diagnosis: 1. Other abnormalities of gait and mobility   2. Unsteadiness on feet        Problem List Patient Active Problem List   Diagnosis Date Noted  . Colon cancer screening   . Arm pain, right 11/08/2017  . Renal insufficiency 11/08/2017  . Tendinitis of right knee 07/03/2017  . Lumbar radiculopathy 05/18/2017  . Status post hemilaminotomy 05/18/2017  . Lumbar degenerative disc disease 05/18/2017  . Spinal stenosis of lumbar region without neurogenic claudication 05/18/2017  . Atrial tachycardia ([place]) 04/14/2017  . SVT (supraventricular tachycardia) ([place]) 04/14/2017  . Essential hypertension 04/14/2017  . Intermittent asthma without complication 97/94/8016  . Osteoporosis 03/21/2017  . Benign essential tremor 03/21/2017  . Obesity 09/14/2011  . Hyperlipidemia 05/12/2003  . Personal history of transient ischemic attack (TIA),  and cerebral infarction without residual deficits 04/22/2003    Audrey Martinez PT, DPT 12/26/2018, 11:07 AM  [place] MAIN [facility] SERVICES [address] [place], [place], [address] Phone: [phone]   Fax:  [phone]  Name: Audrey Martinez MRN: [id number] Date of Birth: [birth year]

## 2018-12-28 ENCOUNTER — Ambulatory Visit: Payer: Medicare Other

## 2018-12-28 ENCOUNTER — Other Ambulatory Visit: Payer: Self-pay

## 2018-12-28 DIAGNOSIS — R490 Dysphonia: Secondary | ICD-10-CM | POA: Diagnosis not present

## 2018-12-28 DIAGNOSIS — R2681 Unsteadiness on feet: Secondary | ICD-10-CM | POA: Diagnosis not present

## 2018-12-28 DIAGNOSIS — R2689 Other abnormalities of gait and mobility: Secondary | ICD-10-CM | POA: Diagnosis not present

## 2018-12-28 NOTE — Therapy (Signed)
[place] MAIN [facility] SERVICES [address] [place], [place], [address] Phone: [phone]   Fax:  [phone]  Physical Therapy Treatment  Patient Details  Name: Audrey Martinez MRN: [id number] Date of Birth: [birth year] Referring Provider (PT): laura burgland   Encounter Date: 12/28/2018  PT End of Session - 12/28/18 1218    Visit Number  8    Number of Visits  16    Date for PT Re-Evaluation  01/18/19    Authorization Type  eval 6/26    PT Start Time  1000    PT Stop Time  1044    PT Time Calculation (min)  44 min    Equipment Utilized During Treatment  Gait belt    Activity Tolerance  Patient tolerated treatment well    Behavior During Therapy  WFL for tasks assessed/performed       Past Medical History:  Diagnosis Date  . Arthritis   . Asthma   . GERD (gastroesophageal reflux disease)   . Herniated disc, cervical   . Left wrist fracture   . Stroke ([place]) 1998  . SVT (supraventricular tachycardia) (HCC)     Past Surgical History:  Procedure Laterality Date  . BACK SURGERY  2017   L2-4 laminectomy and foraminal stenosis  . BREAST CYST ASPIRATION Left 1977  . CARDIAC CATHETERIZATION  2009   [facility] in [place]  . CARDIAC ELECTROPHYSIOLOGY STUDY AND ABLATION  2009  . CATARACT EXTRACTION, BILATERAL  2010  . COLONOSCOPY WITH PROPOFOL N/A 03/21/2018   Procedure: COLONOSCOPY WITH Biopsy;  Surgeon: Lin Landsman, MD;  Location: [place];  Service: Endoscopy;  Laterality: N/A;  . derrick procedure  1977-78   bilateral   . ESOPHAGOGASTRODUODENOSCOPY  2013   gastritis; done for complaint of dysphagia  . ESOPHAGOGASTRODUODENOSCOPY (EGD) WITH PROPOFOL N/A 03/21/2018   Procedure: ESOPHAGOGASTRODUODENOSCOPY (EGD) WITH Biopsies;  Surgeon: Lin Landsman, MD;  Location: [place];  Service: Endoscopy;  Laterality: N/A;  KEEP THIS PATIENT FIRST  . lymph node removal     neck  .  MOHS SURGERY  07/2016   BCCA nose  . POLYPECTOMY N/A 03/21/2018   Procedure: POLYPECTOMY;  Surgeon: Lin Landsman, MD;  Location: [place];  Service: Endoscopy;  Laterality: N/A;  . REPLACEMENT TOTAL KNEE  2013   RT  . TOTAL SHOULDER REPLACEMENT  2012   RT     There were no vitals filed for this visit.  Subjective Assessment - 12/28/18 1003    Subjective  Patient had episodes of shoulder "seizing last night"  limiting her sleep. Reports no other pain in body, no falls or LOB since last session. Has not been doing her HEP due to shoulder pain limiting her comfort in standing    Pertinent History  Patient is a pleasant 73 year female who presents for gait instability. Patient utilizes cane intermittently since her balance has degraded over the past year. She reports she has a worsening of balance when she lost her dog/her dog was stolen.  Patient is going back to [facility] for R shoulder (reverse) due to increased pain. Left leg occasioanlly goes numb/gives way due to back surgeries. Patient's PMH includes SVT, stroke, herniated discs, GERD, asthma, Rt shoulder replacement, Rt knee replacement, and l2-4 laminectomy.    Limitations  Sitting;Lifting;Standing;Walking;House hold activities;Other (comment)    How long can you sit comfortably?  has back pain with prolonged sitting    How long can  you stand comfortably?  needs to hold onto something, 5 minutes    How long can you walk comfortably?  legs go numb from back occasionally    Patient Stated Goals  increase strength to walk up incline to mailbox.    Currently in Pain?  Yes    Pain Score  3     Pain Location  Shoulder    Pain Orientation  Right    Pain Descriptors / Indicators  Sharp    Pain Type  Acute pain    Pain Onset  In the past 7 days    Pain Frequency  Constant            Treatment:  Nustep Lvl 2 LE only RPM >60 for cardiovascular challenge and stability ; 3 minutes  TherEx 4lb ankle weights:               -hip extensions 10x with focus on gluteal activation.   -hip abduction 12x each LE; verbal and tactile cueing for squencing             -LAQ seated; 12x each LE   Seated adduction squeezing ball between feet with LAQ 10x each LE   Patient required min-moderate verbal/tactile cues for correct exercise techniqueincluding cues to avoid trunk lean and to improve hip/knee extension of stance leg for better stance control;  Neuro Re-ed  Hedgehog taps: forward, lateral, backwards 6x each LE, PT verbalizing sequencing, CGA with no UE support.   Half foam roller : df/pf rocking no UE support 60 seconds,   Standing on airex foam: -one foot on airex pad one foot on 6" step 30 seconds each LE back; more challenging with RLE back with increased tremors/shaking of limb with fatigue.  airex pad 6" step toe taps no UE support. 10x each LE; cueing for widening BOS and increasing accuracy of step rather than speed.  airex pad 6" step lateral toe taps no UE support 15x each LE  airex balance beam: 6x length of bars side stepping; CGA with cueing for foot placement, abdominal activation for upright posture   Red mat on floor of // bars with CGA  To promote stability on unstable surfaces such as a grassy surface-TrA contraction cueing and heel strike for optimal positioning x8 trials  Red mat on floor of // bars with CGA, hedhehogs under for mimicking grass outside patient's home x6 trials with cueing for ensuring foot is on solid surface prior to weight shift   Horizontal head turns reading cards ambulating in hallway, cueing to read only red cards, initially missed 2 cards, second attempt missed 1 card.   Red light green light in hallway: PT guiding initiation and termination with patient challenged with sudden termination of ambulation when in tandem stance. Frequent near LOB requiring Min A initially, by last two laps patient had decreasing episodes of LOB    Patient required min  VCs for balance stability, including to increase trunk control for less loss of balance with smaller base of support                           PT Education - 12/28/18 1218    Education Details  exercise technique,stability, body mechanics    Person(s) Educated  Patient    Methods  Explanation;Demonstration;Tactile cues;Verbal cues    Comprehension  Verbalized understanding;Returned demonstration;Verbal cues required;Tactile cues required       PT Short Term Goals - 11/23/18 1201  PT SHORT TERM GOAL #1   Title  Patient will be independent in home exercise program to improve strength/mobility for better functional independence with ADLs.    Baseline  6/26: given    Time  2    Period  Weeks    Status  New    Target Date  12/07/18        PT Long Term Goals - 11/23/18 1202      PT LONG TERM GOAL #1   Title  Patient will increase dynamic gait index score to >19/24 as to demonstrate reduced fall risk and improved dynamic gait balance for better safety with community/home ambulation.    Baseline  6/26: perform next session    Time  8    Period  Weeks    Status  New    Target Date  01/18/19      PT LONG TERM GOAL #2   Title  Patient (> 70 years old) will complete five times sit to stand test in < 15 seconds without UE support indicating an increased LE strength and improved balance.    Baseline  6/26: 23 seconds with excessive BUE support    Time  8    Period  Weeks    Status  New    Target Date  01/18/19      PT LONG TERM GOAL #3   Title  Patient will increase Berg Balance score by > 6 points (45/56)  to demonstrate decreased fall risk during functional activities.    Baseline  6/26: 39/56    Time  8    Period  Weeks    Status  New    Target Date  01/18/19      PT LONG TERM GOAL #4   Title  Patient will increase ABC scale score >80% to demonstrate better functional mobility and better confidence with ADLs.    Baseline  6/26: 51.25%    Time  8     Period  Weeks    Status  New    Target Date  01/18/19      PT LONG TERM GOAL #5   Title  Patient will be modified independent in walking on even/uneven surface with least restrictive assistive device, for 20+ minutes without rest break, reporting some difficulty or less to improve walking tolerance with community ambulation including grocery shopping, going to church,etc.    Baseline  6/26: 10 minutes    Time  8    Period  Weeks    Status  New    Target Date  01/18/19            Plan - 12/28/18 1225    Clinical Impression Statement  Patient presented to physical therapy with good motivation. She is improving in dynamic stability on unstable surfaces with decreasing episodes of LOB. Patient is challenged by sudden termination of movement when ambulating and will continue to benefit from stability interventions. She would benefit from additional skilled PT intervention to improve strength, balance and gait safety.    Personal Factors and Comorbidities  Age;Comorbidity 3+;Fitness;Past/Current Experience;Social Background;Time since onset of injury/illness/exacerbation    Comorbidities  SVT, stroke, herniated discs, GERD, asthma, reverse rt shoulder replacement, L knee replacement    Examination-Activity Limitations  Bend;Caring for Others;Hygiene/Grooming;Lift;Locomotion Level;Reach Overhead;Sit;Squat;Stairs;Stand;Toileting;Transfers    Examination-Participation Restrictions  Church;Community Activity;Driving;Interpersonal Relationship;Laundry;Meal Prep;Shop;Volunteer;Yard Work    Journalist, newspaper    Rehab Potential  Fair    PT Frequency  2x / week  PT Duration  8 weeks    PT Treatment/Interventions  ADLs/Self Care Home Management;Aquatic Therapy;Cryotherapy;Electrical Stimulation;Iontophoresis 4mg /ml Dexamethasone;Moist Heat;Traction;Ultrasound;Functional mobility training;Stair training;Gait training;DME Instruction;Therapeutic  activities;Therapeutic exercise;Balance training;Neuromuscular re-education;Manual techniques;Patient/family education;Compression bandaging;Passive range of motion;Dry needling;Taping;Energy conservation    PT Next Visit Plan  review HEP, standing dynamic stability, DGI    PT Home Exercise Plan  see above       Patient will benefit from skilled therapeutic intervention in order to improve the following deficits and impairments:  Abnormal gait, Decreased activity tolerance, Decreased balance, Decreased endurance, Decreased coordination, Decreased cognition, Decreased mobility, Decreased safety awareness, Difficulty walking, Decreased strength, Dizziness, Impaired flexibility, Impaired perceived functional ability, Impaired UE functional use, Postural dysfunction, Improper body mechanics, Pain  Visit Diagnosis: 1. Other abnormalities of gait and mobility   2. Unsteadiness on feet        Problem List Patient Active Problem List   Diagnosis Date Noted  . Colon cancer screening   . Arm pain, right 11/08/2017  . Renal insufficiency 11/08/2017  . Tendinitis of right knee 07/03/2017  . Lumbar radiculopathy 05/18/2017  . Status post hemilaminotomy 05/18/2017  . Lumbar degenerative disc disease 05/18/2017  . Spinal stenosis of lumbar region without neurogenic claudication 05/18/2017  . Atrial tachycardia ([place]) 04/14/2017  . SVT (supraventricular tachycardia) ([place]) 04/14/2017  . Essential hypertension 04/14/2017  . Intermittent asthma without complication 18/56/3149  . Osteoporosis 03/21/2017  . Benign essential tremor 03/21/2017  . Obesity 09/14/2011  . Hyperlipidemia 05/12/2003  . Personal history of transient ischemic attack (TIA), and cerebral infarction without residual deficits 04/22/2003   Janna Arch, PT, DPT   12/28/2018, 12:25 PM  [place] MAIN [facility] SERVICES [address] [place], [place], [address] Phone: [phone]   Fax:   [phone]  Name: Audrey Martinez MRN: [id number] Date of Birth: Mar 01, 1946

## 2018-12-29 DIAGNOSIS — G453 Amaurosis fugax: Secondary | ICD-10-CM

## 2018-12-29 HISTORY — DX: Amaurosis fugax: G45.3

## 2018-12-31 ENCOUNTER — Ambulatory Visit: Payer: Medicare Other | Admitting: Speech Pathology

## 2019-01-01 ENCOUNTER — Emergency Department: Payer: Medicare Other

## 2019-01-01 ENCOUNTER — Other Ambulatory Visit: Payer: Self-pay

## 2019-01-01 DIAGNOSIS — H5461 Unqualified visual loss, right eye, normal vision left eye: Secondary | ICD-10-CM | POA: Diagnosis not present

## 2019-01-01 DIAGNOSIS — G453 Amaurosis fugax: Secondary | ICD-10-CM | POA: Diagnosis not present

## 2019-01-01 DIAGNOSIS — H538 Other visual disturbances: Secondary | ICD-10-CM | POA: Diagnosis present

## 2019-01-01 DIAGNOSIS — J45909 Unspecified asthma, uncomplicated: Secondary | ICD-10-CM | POA: Insufficient documentation

## 2019-01-01 DIAGNOSIS — H539 Unspecified visual disturbance: Secondary | ICD-10-CM | POA: Diagnosis not present

## 2019-01-01 DIAGNOSIS — G9389 Other specified disorders of brain: Secondary | ICD-10-CM | POA: Diagnosis not present

## 2019-01-01 DIAGNOSIS — Z96651 Presence of right artificial knee joint: Secondary | ICD-10-CM | POA: Diagnosis not present

## 2019-01-01 DIAGNOSIS — Z96611 Presence of right artificial shoulder joint: Secondary | ICD-10-CM | POA: Insufficient documentation

## 2019-01-01 DIAGNOSIS — I10 Essential (primary) hypertension: Secondary | ICD-10-CM | POA: Diagnosis not present

## 2019-01-01 LAB — CBC
HCT: 46.1 % — ABNORMAL HIGH (ref 36.0–46.0)
Hemoglobin: 15.2 g/dL — ABNORMAL HIGH (ref 12.0–15.0)
MCH: 30.6 pg (ref 26.0–34.0)
MCHC: 33 g/dL (ref 30.0–36.0)
MCV: 92.9 fL (ref 80.0–100.0)
Platelets: 234 10*3/uL (ref 150–400)
RBC: 4.96 MIL/uL (ref 3.87–5.11)
RDW: 11.8 % (ref 11.5–15.5)
WBC: 8.1 10*3/uL (ref 4.0–10.5)
nRBC: 0 % (ref 0.0–0.2)

## 2019-01-01 LAB — COMPREHENSIVE METABOLIC PANEL
ALT: 18 U/L (ref 0–44)
AST: 23 U/L (ref 15–41)
Albumin: 4.5 g/dL (ref 3.5–5.0)
Alkaline Phosphatase: 85 U/L (ref 38–126)
Anion gap: 8 (ref 5–15)
BUN: 17 mg/dL (ref 8–23)
CO2: 26 mmol/L (ref 22–32)
Calcium: 9.2 mg/dL (ref 8.9–10.3)
Chloride: 107 mmol/L (ref 98–111)
Creatinine, Ser: 0.99 mg/dL (ref 0.44–1.00)
GFR calc Af Amer: >60 mL/min (ref >60)
GFR calc non Af Amer: 57 mL/min — ABNORMAL LOW (ref >60)
Glucose, Bld: 111 mg/dL — ABNORMAL HIGH (ref 70–99)
Potassium: 4.1 mmol/L (ref 3.5–5.1)
Sodium: 141 mmol/L (ref 135–145)
Total Bilirubin: 0.7 mg/dL (ref 0.3–1.2)
Total Protein: 6.7 g/dL (ref 6.5–8.1)

## 2019-01-01 NOTE — ED Notes (Signed)
FIRST NURSE NOTE:  Pt arrived via POV with reports of vision change in the right eye while watching tv, pt states her vision went black, and states she started seeing sparklers.  Pt has vision in right eye at this time.

## 2019-01-01 NOTE — ED Triage Notes (Addendum)
Pt arrives to ED via POV from home with c/o vision changes x1 1/2 hrs PTA. Pt states she was watching television when she lost vision in the right eye. Pt states she "everything went black in the right eye and then started seeing sparkles". No c/o unilateral arm or leg weakness or numbness, no trouble speaking or swallowing. Pt MAEW, no facial droop, grips are strong and equal bilaterally.

## 2019-01-02 ENCOUNTER — Emergency Department: Payer: Medicare Other

## 2019-01-02 ENCOUNTER — Other Ambulatory Visit: Payer: Self-pay

## 2019-01-02 ENCOUNTER — Other Ambulatory Visit: Payer: Self-pay | Admitting: Internal Medicine

## 2019-01-02 ENCOUNTER — Ambulatory Visit: Payer: Medicare Other | Attending: Unknown Physician Specialty

## 2019-01-02 ENCOUNTER — Ambulatory Visit: Payer: Medicare Other | Admitting: Speech Pathology

## 2019-01-02 ENCOUNTER — Emergency Department
Admission: EM | Admit: 2019-01-02 | Discharge: 2019-01-02 | Disposition: A | Discharge status: Home / Self Care | Payer: Medicare Other | Attending: Emergency Medicine | Admitting: Emergency Medicine

## 2019-01-02 DIAGNOSIS — R2689 Other abnormalities of gait and mobility: Secondary | ICD-10-CM | POA: Insufficient documentation

## 2019-01-02 DIAGNOSIS — G453 Amaurosis fugax: Secondary | ICD-10-CM | POA: Diagnosis not present

## 2019-01-02 DIAGNOSIS — R2681 Unsteadiness on feet: Secondary | ICD-10-CM | POA: Insufficient documentation

## 2019-01-02 DIAGNOSIS — R490 Dysphonia: Secondary | ICD-10-CM | POA: Insufficient documentation

## 2019-01-02 DIAGNOSIS — H5461 Unqualified visual loss, right eye, normal vision left eye: Secondary | ICD-10-CM | POA: Diagnosis not present

## 2019-01-02 DIAGNOSIS — G9389 Other specified disorders of brain: Secondary | ICD-10-CM | POA: Diagnosis not present

## 2019-01-02 NOTE — ED Provider Notes (Signed)
[facility] Emergency Department Provider Note ____________________________________________   First MD Initiated Contact with Patient 01/02/19 [phone]     (approximate)  I have reviewed the triage vital signs and the nursing notes.   HISTORY is  Chief Complaint Eye Problem    HPI Audrey Martinez is a 73 y.o. female with PMH as noted below who presents with right eye vision loss, acute onset approximately 2 hours ago, lasting for approximately 3 to 5 minutes and subsequently resolved.  She states that everything went black in the right eye and then she started seeing sparkles.  The vision then returned to lines and shapes, and now she states that her vision is back to normal.  She had no diplopia.  She reports mild itching or soreness in the right eye but no pain.  She has no headache.  She denies any other neurologic symptoms.  The patient reports that she has a remote history of a stroke in the 76s.  She is unable to take aspirin or statins.  She also has a history of scintillating scotomata visual disturbances in the past although she has never had symptoms like what she had today.  Past Medical History:  Diagnosis Date  . Arthritis   . Asthma   . GERD (gastroesophageal reflux disease)   . Herniated disc, cervical   . Left wrist fracture   . Stroke ([place]) 1998  . SVT (supraventricular tachycardia) [facility])     Patient Active Problem List   Diagnosis Date Noted  . Colon cancer screening   . Arm pain, right 11/08/2017  . Renal insufficiency 11/08/2017  . Tendinitis of right knee 07/03/2017  . Lumbar radiculopathy 05/18/2017  . Status post hemilaminotomy 05/18/2017  . Lumbar degenerative disc disease 05/18/2017  . Spinal stenosis of lumbar region without neurogenic claudication 05/18/2017  . Atrial tachycardia ([place]) 04/14/2017  . SVT (supraventricular tachycardia) ([place]) 04/14/2017  . Essential hypertension 04/14/2017  . Intermittent asthma without  complication 41/66/0630  . Osteoporosis 03/21/2017  . Benign essential tremor 03/21/2017  . Obesity 09/14/2011  . Hyperlipidemia 05/12/2003  . Personal history of transient ischemic attack (TIA), and cerebral infarction without residual deficits 04/22/2003    Past Surgical History:  Procedure Laterality Date  . BACK SURGERY  2017   L2-4 laminectomy and foraminal stenosis  . BREAST CYST ASPIRATION Left 1977  . CARDIAC CATHETERIZATION  2009   [facility] in [place]  . CARDIAC ELECTROPHYSIOLOGY STUDY AND ABLATION  2009  . CATARACT EXTRACTION, BILATERAL  2010  . COLONOSCOPY WITH PROPOFOL N/A 03/21/2018   Procedure: COLONOSCOPY WITH Biopsy;  Surgeon: Lin Landsman, MD;  Location: [place];  Service: Endoscopy;  Laterality: N/A;  . derrick procedure  1977-78   bilateral   . ESOPHAGOGASTRODUODENOSCOPY  2013   gastritis; done for complaint of dysphagia  . ESOPHAGOGASTRODUODENOSCOPY (EGD) WITH PROPOFOL N/A 03/21/2018   Procedure: ESOPHAGOGASTRODUODENOSCOPY (EGD) WITH Biopsies;  Surgeon: Lin Landsman, MD;  Location: [place];  Service: Endoscopy;  Laterality: N/A;  KEEP THIS PATIENT FIRST  . lymph node removal     neck  . MOHS SURGERY  07/2016   BCCA nose  . POLYPECTOMY N/A 03/21/2018   Procedure: POLYPECTOMY;  Surgeon: Lin Landsman, MD;  Location: [place];  Service: Endoscopy;  Laterality: N/A;  . REPLACEMENT TOTAL KNEE  2013   RT  . TOTAL SHOULDER REPLACEMENT  2012   RT     Prior to Admission medications  Medication Sig Start Date End Date Taking? Authorizing Provider  albuterol (PROVENTIL HFA;VENTOLIN HFA) 108 (90 Base) MCG/ACT inhaler Inhale into the lungs every 6 (six) hours as needed for wheezing or shortness of breath.    [provider]  baclofen (LIORESAL) 10 MG tablet TAKE 1 TABLET BY MOUTH THREE TIMES A DAY Patient taking differently: 2 (two) times daily.  10/27/17   Glean Hess, MD   calcium-vitamin D (OSCAL WITH D) 500-200 MG-UNIT tablet Take 1 tablet by mouth.    [provider]  cholecalciferol (VITAMIN D) 400 units TABS tablet Take 400 Units by mouth.    [provider]  Cranberry 1000 MG CAPS Take by mouth.    [provider]  diltiazem (CARDIZEM CD) 180 MG 24 hr capsule TAKE 1 CAPSULE (180 MG TOTAL) DAILY BY MOUTH. 07/03/18   Gollan, Kathlene November, MD  diltiazem (CARDIZEM) 60 MG tablet Take 60 mg by mouth 4 (four) times daily.    [provider]  ezetimibe (ZETIA) 10 MG tablet Take 1 tablet (10 mg total) by mouth daily. 11/20/18   Glean Hess, MD  FLOVENT HFA 110 MCG/ACT inhaler TAKE 2 PUFFS BY MOUTH TWICE A DAY 07/24/18   Laverle Hobby, MD  gabapentin (NEURONTIN) 300 MG capsule Take 1 capsule (300 mg total) by mouth 3 (three) times daily. Three capsules daily at bedtime. 11/08/17   Glean Hess, MD  Ginger 500 MG CAPS Take by mouth.    [provider]  Misc Natural Products (TART CHERRY ADVANCED PO) Take by mouth.    [provider]  montelukast (SINGULAIR) 10 MG tablet Take 10 mg by mouth every evening. 10/25/18   [provider]  Multiple Vitamins-Minerals (MULTIVITAMIN WITH MINERALS) tablet Take 1 tablet by mouth daily.    [provider]  NON FORMULARY Protleotic Enzymes    [provider]  Probiotic Product (PROBIOTIC DAILY PO) Take by mouth.    [provider]  STRIVERDI RESPIMAT 2.5 MCG/ACT AERS INHALE 2 PUFFS BY MOUTH INTO THE LUNGS DAILY 12/17/18   Laverle Hobby, MD  Turmeric 500 MG CAPS Take by mouth.    [provider]    Allergies Aspirin, Ciprofloxacin, Ciprofloxacin-dexamethasone, Crestor [rosuvastatin], Morphine and related, Amoxicillin, Doxycycline hyclate, Hydrochlorothiazide, Keflex [cephalexin], Lovastatin, Simvastatin, Erythromycin, Ibuprofen, Neomycin-bacitracin zn-polymyx, Omeprazole, and Tape  Family History  Problem Relation Age  of Onset  . Diabetes Mother   . Stroke Mother   . Valvular heart disease Father   . Breast cancer Neg Hx     Social History Social History   Tobacco Use  . Smoking status: Never Smoker  . Smokeless tobacco: Never Used  Substance Use Topics  . Alcohol use: Yes    Comment: may drink 1-2x/yr  . Drug use: No    Review of Systems  Constitutional: No fever/chills Eyes: Positive for resolved visual changes. ENT: No sore throat. Cardiovascular: Denies chest pain. Respiratory: Denies shortness of breath. Gastrointestinal: No nausea, no vomiting.  No diarrhea.  Genitourinary: Negative for dysuria.  Musculoskeletal: Negative for back pain. Skin: Negative for rash. Neurological: Negative for headaches, focal weakness or numbness.   ____________________________________________   PHYSICAL EXAM:  VITAL SIGNS: ED Triage Vitals [01/01/19 2319]  Enc Vitals Group     BP (!) 133/95     Pulse Rate 93     Resp 18     Temp 97.9 F (36.6 C)     Temp Source Oral     SpO2 96 %  Weight 217 lb (98.4 kg)     Height 5\' 6"  (1.676 m)     Head Circumference      Peak Flow      Pain Score 0     Pain Loc      Pain Edu?      Excl. in [place]?     Constitutional: Alert and oriented. Well appearing and in no acute distress. Eyes: Conjunctivae are normal.  EOMI.  PERRLA.  Funduscopic exam (without dilation of the pupil) grossly normal on the right. Head: Atraumatic. Nose: No congestion/rhinnorhea. Mouth/Throat: Mucous membranes are moist.   Neck: Normal range of motion.  Cardiovascular: Normal rate, regular rhythm. Good peripheral circulation. Respiratory: Normal respiratory effort.  No retractions. Gastrointestinal: No distention.  Musculoskeletal: No lower extremity edema.  Extremities warm and well perfused.  Neurologic:  Normal speech and language.  Motor and sensory intact in all extremities.  No ataxia on finger-to-nose.  No pronator drift.  Cranial nerves II through XII intact.    Skin:  Skin is warm and dry. No rash noted. Psychiatric: Mood and affect are normal. Speech and behavior are normal.  ____________________________________________   LABS (all labs ordered are listed, but only abnormal results are displayed)  Labs Reviewed  COMPREHENSIVE METABOLIC PANEL - Abnormal; Notable for the following components:      Result Value   Glucose, Bld 111 (*)    GFR calc non Af Amer 57 (*)    All other components within normal limits  CBC - Abnormal; Notable for the following components:   Hemoglobin 15.2 (*)    HCT 46.1 (*)    All other components within normal limits   ____________________________________________  EKG  ED ECG REPORT I, Arta Silence, the attending physician, personally viewed and interpreted this ECG.  Date: 01/02/2019 EKG Time: 0202 Rate: 74 Rhythm: normal sinus rhythm QRS Axis: normal Intervals: Prolonged PR, prolonged QT ST/T Wave abnormalities: normal Narrative Interpretation: no evidence of acute ischemia  ____________________________________________  RADIOLOGY  CT head: Left frontal encephalomalacia with no acute abnormality MR brain: No acute abnormality  ____________________________________________   PROCEDURES  Procedure(s) performed: No  Procedures  Critical Care performed: No ____________________________________________   INITIAL IMPRESSION / ASSESSMENT AND PLAN / ED COURSE  Pertinent labs & imaging results that were available during my care of the patient were reviewed by me and considered in my medical decision making (see chart for details).  73 year old female with PMH as noted above including a remote history of a stroke presents with right-sided vision loss acute onset this evening which lasted for less than 5 minutes and now has resolved.  She denies any significant pain to the eye.  She had no other neurologic symptoms.  I reviewed the past medical records in [place].  The patient was most recently  seen in the ED in March for cough and low-grade temperature but has no other recent prior ED visits or admissions.  On exam she is very well-appearing.  Her vital signs are normal.  The neurologic exam as well as extraocular and funduscopic exam are normal.  Overall presentation is most consistent with amaurosis fugax.  Differential would include ocular migraine or variant of the patient's prior scotoma episodes.  I do not suspect CRAO given that the symptoms have resolved.  The patient has no other findings to suggest an acute stroke.  CT head obtained from triage shows no acute findings.  The patient's ABCD 2 score is 2.  She is at somewhat increased  risk of a stroke overall.  We will obtain an MRI of the brain.  If this is negative and she has no recurrence of her symptoms, the patient will be appropriate for further outpatient work-up.  ----------------------------------------- 3:37 AM on 01/02/2019 -----------------------------------------  MR brain is negative for any acute findings.  The patient has not had any recurrence of her symptoms and has no new neurologic symptoms.  At this time, the patient is stable for discharge home.  She very much would prefer to go home with outpatient follow-up although I did offer her observation admission if she felt more comfortable with this.  I have provided referral for the ophthalmologist, and I instructed her to contact her PMD to make a follow-up appointment and determine if further outpatient work-up is warranted.  I gave the patient thorough return precautions and she expressed understanding.  ____________________________________________   FINAL CLINICAL IMPRESSION(S) / ED DIAGNOSES  Final diagnoses:  Amaurosis fugax      NEW MEDICATIONS STARTED DURING THIS VISIT:  New Prescriptions   No medications on file     Note:  This document was prepared using Dragon voice recognition software and may include unintentional dictation errors.     Arta Silence, MD 01/02/19 [phone]

## 2019-01-02 NOTE — ED Notes (Signed)
Pt returned from MRI °

## 2019-01-02 NOTE — ED Notes (Signed)
Pt in MRI.

## 2019-01-02 NOTE — ED Notes (Signed)
ED Provider at bedside. 

## 2019-01-02 NOTE — Discharge Instructions (Addendum)
We have provided a referral to the eye doctor.  Call in the morning to arrange for a follow-up appointment within the next 1 to 2 days.  You should also contact your primary care doctor to determine if any further outpatient work-up is needed.  Return to the ER immediately for new, worsening, or recurrent vision loss or vision change, any other neurologic or strokelike symptoms such as disturbed speech or any weakness or numbness, or any other new or worsening symptoms that concern you.

## 2019-01-03 ENCOUNTER — Other Ambulatory Visit: Payer: Self-pay | Admitting: Ophthalmology

## 2019-01-03 DIAGNOSIS — G453 Amaurosis fugax: Secondary | ICD-10-CM

## 2019-01-04 ENCOUNTER — Ambulatory Visit: Payer: Medicare Other

## 2019-01-04 ENCOUNTER — Other Ambulatory Visit: Payer: Self-pay

## 2019-01-04 DIAGNOSIS — R490 Dysphonia: Secondary | ICD-10-CM

## 2019-01-04 DIAGNOSIS — R2689 Other abnormalities of gait and mobility: Secondary | ICD-10-CM

## 2019-01-04 DIAGNOSIS — R2681 Unsteadiness on feet: Secondary | ICD-10-CM
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
## 2019-01-07 ENCOUNTER — Ambulatory Visit: Payer: Medicare Other | Admitting: Speech Pathology

## 2019-01-09 ENCOUNTER — Ambulatory Visit: Payer: Medicare Other

## 2019-01-09 ENCOUNTER — Other Ambulatory Visit: Payer: Self-pay

## 2019-01-09 ENCOUNTER — Ambulatory Visit: Payer: Medicare Other | Admitting: Speech Pathology

## 2019-01-09 DIAGNOSIS — R490 Dysphonia: Secondary | ICD-10-CM

## 2019-01-09 DIAGNOSIS — R2689 Other abnormalities of gait and mobility: Secondary | ICD-10-CM | POA: Diagnosis not present

## 2019-01-09 DIAGNOSIS — R2681 Unsteadiness on feet: Secondary | ICD-10-CM | POA: Diagnosis not present
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
## 2019-01-10 ENCOUNTER — Telehealth: Payer: Self-pay

## 2019-01-10 NOTE — Telephone Encounter (Signed)
Called pt to switch visit to phone or virtual per Dr. Zoila Shutter request.  Pt is requesting to switch providers.  DK and DR please advise if okay to switch. Thanks

## 2019-01-10 NOTE — Telephone Encounter (Signed)
Pt called back and states that she would like to see Dr. Patsey Berthold.

## 2019-01-10 NOTE — Telephone Encounter (Signed)
LG please advise if okay to switch.

## 2019-01-11 ENCOUNTER — Other Ambulatory Visit: Payer: Self-pay

## 2019-01-11 ENCOUNTER — Ambulatory Visit
Admission: RE | Admit: 2019-01-11 | Discharge: 2019-01-11 | Disposition: A | Discharge status: Home / Self Care | Payer: Medicare Other | Source: Ambulatory Visit | Attending: Ophthalmology | Admitting: Ophthalmology

## 2019-01-11 DIAGNOSIS — I6523 Occlusion and stenosis of bilateral carotid arteries: Secondary | ICD-10-CM | POA: Diagnosis not present

## 2019-01-11 DIAGNOSIS — G453 Amaurosis fugax: Secondary | ICD-10-CM | POA: Insufficient documentation

## 2019-01-13 ENCOUNTER — Other Ambulatory Visit: Payer: Self-pay | Admitting: Internal Medicine

## 2019-01-13 DIAGNOSIS — G453 Amaurosis fugax: Secondary | ICD-10-CM | POA: Insufficient documentation

## 2019-01-14 ENCOUNTER — Ambulatory Visit: Payer: Medicare Other | Admitting: Internal Medicine

## 2019-01-14 NOTE — Telephone Encounter (Signed)
DK please advise. Thanks 

## 2019-01-14 NOTE — Telephone Encounter (Signed)
Yes.  OK with me. 

## 2019-01-14 NOTE — Telephone Encounter (Signed)
Dr. Patsey Berthold recommended that you discuss this with pt.

## 2019-01-14 NOTE — Telephone Encounter (Signed)
I guess it is OK but I recommend Dr. Mortimer Fries discuss with patient.

## 2019-01-15 DIAGNOSIS — Z85828 Personal history of other malignant neoplasm of skin: Secondary | ICD-10-CM | POA: Diagnosis not present

## 2019-01-15 DIAGNOSIS — L578 Other skin changes due to chronic exposure to nonionizing radiation: Secondary | ICD-10-CM | POA: Diagnosis not present

## 2019-01-15 DIAGNOSIS — D692 Other nonthrombocytopenic purpura: Secondary | ICD-10-CM | POA: Diagnosis not present

## 2019-01-15 DIAGNOSIS — L814 Other melanin hyperpigmentation: Secondary | ICD-10-CM | POA: Diagnosis not present

## 2019-01-15 DIAGNOSIS — L821 Other seborrheic keratosis: Secondary | ICD-10-CM | POA: Diagnosis not present

## 2019-01-15 DIAGNOSIS — L738 Other specified follicular disorders: Secondary | ICD-10-CM | POA: Diagnosis not present

## 2019-01-15 DIAGNOSIS — D1800 Hemangioma unspecified site: Secondary | ICD-10-CM | POA: Diagnosis not present

## 2019-01-15 DIAGNOSIS — L570 Actinic keratosis: Secondary | ICD-10-CM | POA: Diagnosis not present

## 2019-01-15 DIAGNOSIS — D2371 Other benign neoplasm of skin of right lower limb, including hip: Secondary | ICD-10-CM | POA: Diagnosis not present

## 2019-01-15 DIAGNOSIS — Z1283 Encounter for screening for malignant neoplasm of skin: Secondary | ICD-10-CM | POA: Diagnosis not present

## 2019-01-15 DIAGNOSIS — L986 Other infiltrative disorders of the skin and subcutaneous tissue: Secondary | ICD-10-CM | POA: Diagnosis not present

## 2019-01-16 ENCOUNTER — Other Ambulatory Visit: Payer: Self-pay

## 2019-01-16 ENCOUNTER — Encounter: Payer: Self-pay | Admitting: Physical Therapy

## 2019-01-16 ENCOUNTER — Ambulatory Visit: Payer: Medicare Other | Admitting: Physical Therapy

## 2019-01-16 DIAGNOSIS — R2689 Other abnormalities of gait and mobility: Secondary | ICD-10-CM

## 2019-01-16 DIAGNOSIS — R490 Dysphonia: Secondary | ICD-10-CM | POA: Diagnosis not present

## 2019-01-16 DIAGNOSIS — R2681 Unsteadiness on feet: Secondary | ICD-10-CM | POA: Diagnosis not present

## 2019-01-16 DIAGNOSIS — K219 Gastro-esophageal reflux disease without esophagitis: Secondary | ICD-10-CM | POA: Diagnosis not present

## 2019-01-16 DIAGNOSIS — J301 Allergic rhinitis due to pollen: Secondary | ICD-10-CM | POA: Diagnosis not present

## 2019-01-16 NOTE — Telephone Encounter (Signed)
Pt called back wanting a follow-up. I advised pt that we would follow up with her as soon as a decision is made. She stated that her ENT doctor recommends she be seen in office because of her history.

## 2019-01-16 NOTE — Telephone Encounter (Signed)
DK please advise. Thanks 

## 2019-01-16 NOTE — Therapy (Signed)
[place] MAIN [facility] SERVICES [address] [place], [place], [address] Phone: [phone]   Fax:  [phone]  Physical Therapy Treatment/Discharge Summary  Patient Details  Name: Audrey Martinez MRN: [id number] Date of Birth: 1946/02/28 Referring Provider (PT): laura burgland   Encounter Date: 01/16/2019  PT End of Session - 01/16/19 0929    Visit Number  11    Number of Visits  16    Date for PT Re-Evaluation  01/18/19    Authorization Type  eval 6/26    PT Start Time  0931    PT Stop Time  1015    PT Time Calculation (min)  44 min    Equipment Utilized During Treatment  Gait belt    Activity Tolerance  Patient tolerated treatment well;No increased pain    Behavior During Therapy  WFL for tasks assessed/performed       Past Medical History:  Diagnosis Date  . Arthritis   . Asthma   . GERD (gastroesophageal reflux disease)   . Herniated disc, cervical   . Left wrist fracture   . Stroke ([place]) 1998  . SVT (supraventricular tachycardia) (HCC)     Past Surgical History:  Procedure Laterality Date  . BACK SURGERY  2017   L2-4 laminectomy and foraminal stenosis  . BREAST CYST ASPIRATION Left 1977  . CARDIAC CATHETERIZATION  2009   [facility] in [place]  . CARDIAC ELECTROPHYSIOLOGY STUDY AND ABLATION  2009  . CATARACT EXTRACTION, BILATERAL  2010  . COLONOSCOPY WITH PROPOFOL N/A 03/21/2018   Procedure: COLONOSCOPY WITH Biopsy;  Surgeon: Lin Landsman, MD;  Location: [place];  Service: Endoscopy;  Laterality: N/A;  . derrick procedure  1977-78   bilateral   . ESOPHAGOGASTRODUODENOSCOPY  2013   gastritis; done for complaint of dysphagia  . ESOPHAGOGASTRODUODENOSCOPY (EGD) WITH PROPOFOL N/A 03/21/2018   Procedure: ESOPHAGOGASTRODUODENOSCOPY (EGD) WITH Biopsies;  Surgeon: Lin Landsman, MD;  Location: [place];  Service: Endoscopy;  Laterality: N/A;  KEEP THIS PATIENT FIRST   . lymph node removal     neck  . MOHS SURGERY  07/2016   B[facility] nose  . POLYPECTOMY N/A 03/21/2018   Procedure: POLYPECTOMY;  Surgeon: Lin Landsman, MD;  Location: [place];  Service: Endoscopy;  Laterality: N/A;  . REPLACEMENT TOTAL KNEE  2013   RT  . TOTAL SHOULDER REPLACEMENT  2012   RT     There were no vitals filed for this visit.  Subjective Assessment - 01/16/19 0934    Subjective  Patient reports doing well; reports that her right shoulder has been getting better with less pain at rest, only getting discomfort with movement; She is following up with orthopedic MD next week and is planning on possibly getting another shoulder surgery; She reports her balance has been getting better; She reports stepping on a dog toy the other day and states she was stable.    Pertinent History  Patient is a pleasant 73 year female who presents for gait instability. Patient utilizes cane intermittently since her balance has degraded over the past year. She reports she has a worsening of balance when she lost her dog/her dog was stolen.  Patient is going back to [facility] for R shoulder (reverse) due to increased pain. Left leg occasioanlly goes numb/gives way due to back surgeries. Patient's PMH includes SVT, stroke, herniated discs, GERD, asthma, Rt shoulder replacement, Rt knee replacement, and l2-4 laminectomy.    Currently  in Pain?  No/denies    Multiple Pain Sites  No         [facility] PT Assessment - 01/16/19 0001      Observation/Other Assessments   Activities of Balance Confidence Scale (ABC Scale)   49% (the lower the score the greater the fall risk, no significant change from 11/23/18 which was 51.25%      Standardized Balance Assessment   Five times sit to stand comments   11.26 sec without UE assist; improved from 23 sec on 11/23/18, low fall risk      Berg Balance Test   Sit to Stand  Able to stand without using hands and stabilize independently    Standing  Unsupported  Able to stand safely 2 minutes    Sitting with Back Unsupported but Feet Supported on Floor or Stool  Able to sit safely and securely 2 minutes    Stand to Sit  Sits safely with minimal use of hands    Transfers  Able to transfer safely, minor use of hands    Standing Unsupported with Eyes Closed  Able to stand 10 seconds with supervision    Standing Unsupported with Feet Together  Able to place feet together independently and stand 1 minute safely    From Standing, Reach Forward with Outstretched Arm  Can reach confidently >25 cm (10")    From Standing Position, Pick up Object from Floor  Able to pick up shoe safely and easily    From Standing Position, Turn to Look Behind Over each Shoulder  Looks behind from both sides and weight shifts well    Turn 360 Degrees  Able to turn 360 degrees safely in 4 seconds or less    Standing Unsupported, Alternately Place Feet on Step/Stool  Able to stand independently and safely and complete 8 steps in 20 seconds    Standing Unsupported, One Foot in Front  Able to place foot tandem independently and hold 30 seconds    Standing on One Leg  Able to lift leg independently and hold 5-10 seconds    Total Score  54    Berg comment:  low fall risk  (Improved from 39/56 on 11/23/18)      Dynamic Gait Index   Level Surface  Normal    Change in Gait Speed  Normal    Gait with Horizontal Head Turns  Mild Impairment    Gait with Vertical Head Turns  Normal    Gait and Pivot Turn  Normal    Step Over Obstacle  Normal    Step Around Obstacles  Normal    Steps  Mild Impairment    Total Score  22    DGI comment:  low fall risk       TREATMENT: Instructed patient in outcome measures to address goals;    Patient complained of posterior -lateral hip discomfort during outcome measure assessment from advanced hip strengthening from last session;  Patient transitioned to hooklying concurrent with moist heat to low back for better tolerance:   Instructed patient in modified piriformis stretch, hooklying 20 sec hold x2 reps each LE; Lumbar trunk rotation x1 min each direction; Patient reports resolution of posterior hip pain following stretches;  PT reinforced HEP with strengthening/balance exercise. Patient verbalized understanding   Patient agreeable to discharge from PT this session;                      PT Education - 01/16/19 8372  Education Details  progress towards goals/strengthening/balance;    Person(s) Educated  Patient    Methods  Explanation;Verbal cues    Comprehension  Verbalized understanding;Returned demonstration;Verbal cues required;Need further instruction       PT Short Term Goals - 01/16/19 0930      PT SHORT TERM GOAL #1   Title  Patient will be independent in home exercise program to improve strength/mobility for better functional independence with ADLs.    Baseline  6/26: given    Time  2    Period  Weeks    Status  Achieved    Target Date  12/07/18        PT Long Term Goals - 01/16/19 0930      PT LONG TERM GOAL #1   Title  Patient will increase dynamic gait index score to >19/24 as to demonstrate reduced fall risk and improved dynamic gait balance for better safety with community/home ambulation.    Baseline  6/26: perform next session    Time  8    Period  Weeks    Status  Achieved    Target Date  01/18/19      PT LONG TERM GOAL #2   Title  Patient (> 59 years old) will complete five times sit to stand test in < 15 seconds without UE support indicating an increased LE strength and improved balance.    Baseline  6/26: 23 seconds with excessive BUE support    Time  8    Period  Weeks    Status  Achieved    Target Date  01/18/19      PT LONG TERM GOAL #3   Title  Patient will increase Berg Balance score by > 6 points (45/56)  to demonstrate decreased fall risk during functional activities.    Baseline  6/26: 39/56    Time  8    Period  Weeks    Status   Achieved    Target Date  01/18/19      PT LONG TERM GOAL #4   Title  Patient will increase ABC scale score >80% to demonstrate better functional mobility and better confidence with ADLs.    Baseline  6/26: 51.25%    Time  8    Period  Weeks    Status  Not Met    Target Date  01/18/19      PT LONG TERM GOAL #5   Title  Patient will be modified independent in walking on even/uneven surface with least restrictive assistive device, for 20+ minutes without rest break, reporting some difficulty or less to improve walking tolerance with community ambulation including grocery shopping, going to [place]  6/26: 10 minutes 8/19: able to go to store without difficulty;    Time  8    Period  Weeks    Status  Achieved    Target Date  01/18/19            Plan - 01/16/19 1014    Clinical Impression Statement  Patient is doing well. Assessed goals this session; Patient has met most goals and exhibits improved static and dynamic balance, testing as a low fall risk. She has an extensive HEP for strengthening and balance. Patient is appropriate for discharge from PT and is agreeable to stopping at this time; All needs have been addressed;    Comorbidities  SVT, stroke, herniated discs, GERD, asthma, reverse rt shoulder replacement, L knee replacement    Rehab Potential  Fair    PT Frequency  2x / week    PT Duration  8 weeks    PT Treatment/Interventions  ADLs/Self Care Home Management;Aquatic Therapy;Cryotherapy;Electrical Stimulation;Iontophoresis 80m/ml Dexamethasone;Moist Heat;Traction;Ultrasound;Functional mobility training;Stair training;Gait training;DME Instruction;Therapeutic activities;Therapeutic exercise;Balance training;Neuromuscular re-education;Manual techniques;Patient/family education;Compression bandaging;Passive range of motion;Dry needling;Taping;Energy conservation    PT Next Visit Plan  review HEP, standing dynamic stability, DGI    PT Home Exercise Plan  no  updates today    Consulted and Agree with Plan of Care  Patient       Patient will benefit from skilled therapeutic intervention in order to improve the following deficits and impairments:  Abnormal gait, Decreased activity tolerance, Decreased balance, Decreased endurance, Decreased coordination, Decreased cognition, Decreased mobility, Decreased safety awareness, Difficulty walking, Decreased strength, Dizziness, Impaired flexibility, Impaired perceived functional ability, Impaired UE functional use, Postural dysfunction, Improper body mechanics, Pain  Visit Diagnosis: 1. Other abnormalities of gait and mobility   2. Unsteadiness on feet        Problem List Patient Active Problem List   Diagnosis Date Noted  . Amaurosis fugax of right eye 01/13/2019  . Colon cancer screening   . Arm pain, right 11/08/2017  . Renal insufficiency 11/08/2017  . Tendinitis of right knee 07/03/2017  . Lumbar radiculopathy 05/18/2017  . Status post hemilaminotomy 05/18/2017  . Lumbar degenerative disc disease 05/18/2017  . Spinal stenosis of lumbar region without neurogenic claudication 05/18/2017  . [place] [employer] 04/14/2017  . [place] 04[place]  . Essential hypertension 04/14/2017  . Intermittent asthma without complication 103/70/4888 . Osteoporosis 03/21/2017  . Benign essential tremor 03/21/2017  . Obesity 09/14/2011  . Hyperlipidemia 05/12/2003  . Personal history of transient ischemic attack (TIA), and cerebral infarction without residual deficits 04/22/2003    Clevland Cork PT, DPT 01/16/2019, 10:15 AM  [place] [facility] [address] [address]Phone: [phone]  Fax:  [phone] Name: Audrey McclurkinMRN: [id number] of Birth: 619-Aug-1947

## 2019-01-17 NOTE — Telephone Encounter (Signed)
I will call patient

## 2019-01-18 ENCOUNTER — Ambulatory Visit: Payer: Medicare Other

## 2019-01-21 ENCOUNTER — Ambulatory Visit: Payer: Medicare Other

## 2019-01-21 NOTE — Telephone Encounter (Signed)
Pt has been contacted by DK and will have virtual visit with him on 01/22/2019.

## 2019-01-22 ENCOUNTER — Encounter: Payer: Self-pay | Admitting: Internal Medicine

## 2019-01-22 ENCOUNTER — Ambulatory Visit (INDEPENDENT_AMBULATORY_CARE_PROVIDER_SITE_OTHER): Payer: Medicare Other | Admitting: Internal Medicine

## 2019-01-22 ENCOUNTER — Telehealth: Payer: Self-pay

## 2019-01-22 DIAGNOSIS — J4520 Mild intermittent asthma, uncomplicated: Secondary | ICD-10-CM | POA: Diagnosis not present

## 2019-01-22 DIAGNOSIS — J309 Allergic rhinitis, unspecified: Secondary | ICD-10-CM | POA: Diagnosis not present

## 2019-01-22 MED ORDER — STRIVERDI RESPIMAT 2.5 MCG/ACT IN AERS: INHALATION_SPRAY | RESPIRATORY_TRACT | 6 refills | Status: DC

## 2019-01-22 MED ORDER — FLOVENT HFA 110 MCG/ACT IN AERO: INHALATION_SPRAY | RESPIRATORY_TRACT | 6 refills | Status: DC

## 2019-01-22 MED ORDER — AEROCHAMBER MV MISC: 0 refills | Status: DC

## 2019-01-22 NOTE — Telephone Encounter (Signed)
Spacer has been placed up front for pickup. Rx has been placed in DK's folder for signature.  Pt is aware and voiced her understanding.  Nothing further is needed.

## 2019-01-22 NOTE — Progress Notes (Signed)
Name: Audrey Martinez MRN: [id number] DOB: 26-Feb-1946     I connected with the patient by telephone enabled telemedicine visit and verified that I am speaking with the correct person using two identifiers.    I discussed the limitations, risks, security and privacy concerns of performing an evaluation and management service by telemedicine and the availability of in-person appointments. I also discussed with the patient that there may be a patient responsible charge related to this service. The patient expressed understanding and agreed to proceed.  PATIENT AGREES AND CONFIRMS -YES   Other persons participating in the visit and their role in the encounter: Patient, nursing    I discussed the limitations, risks, security and privacy concerns of performing an evaluation and management service by telephone and the availability of in person appointments. I also discussed with the patient that there may be a patient responsible charge related to this service. The patient expressed understanding and agreed to proceed.  This visit type was conducted due to national recommendations for restrictions regarding the COVID-19 Pandemic (e.g. social distancing).  This format is felt to be most appropriate for this patient at this time.  All issues noted in this document were discussed and addressed.       CONSULTATION DATE:01/22/2019  REFERRING MD :  Dr Rockey Situ  CHIEF COMPLAINT: Follow up SOB   STUDIES:  NO CXR at this time     HISTORY OF PRESENT ILLNESS:   Follow up ASTHMA assessment Doing well with inhalers No infection No asthma exacerbation at this time  No fevers, no chills Intermittent wheezing and cough  On Striverdi, on Flovent Rinse mouth out every use  Albuterol use is very infrequent  GERD on PPI -seems to be controlled Cimetidine twice daily  ENT referral completed-no issues per patient Speech therapy working with patient  Has dogs all her life-no allergies to  dogs  Patient does not take any aspirin due to the fact that she states that she has an aspirin sensitivity that triggers her asthma   chronic nasal congestion -takes Singulair Takes loratadine that helps symptoms   Review of Systems:  Gen:  Denies  fever, sweats, chills weigh loss  HEENT: Denies blurred vision, double vision, ear pain, eye pain, hearing loss, nose bleeds, sore throat Cardiac:  No dizziness, chest pain or heaviness, chest tightness,edema, No JVD Resp:   No cough, -sputum production, -shortness of breath,-wheezing, -hemoptysis,  Gi: Denies swallowing difficulty, stomach pain, nausea or vomiting, diarrhea, constipation, bowel incontinence Gu:  Denies bladder incontinence, burning urine Ext:   Denies Joint pain, stiffness or swelling Skin: Denies  skin rash, easy bruising or bleeding or hives Endoc:  Denies polyuria, polydipsia , polyphagia or weight change Psych:   Denies depression, insomnia or hallucinations  Other:  All other systems negative    Physical Examination:  Well nourished, well developed female in no acute distress. No Obvious Respiratory Distress noted EOMI intact, NO obvious oral lesions Facial Skin intact-no facial rash noted CN 3-12 intact   Insight, judgment intact. -depression -anxiety       Active Ambulatory Problems    Diagnosis Date Noted  . Osteoporosis 03/21/2017  . Personal history of transient ischemic attack (TIA), and cerebral infarction without residual deficits 04/22/2003  . Hyperlipidemia 05/12/2003  . Obesity 09/14/2011  . Benign essential tremor 03/21/2017  . Atrial tachycardia ([place]) 04/14/2017  . SVT (supraventricular tachycardia) ([place]) 04/14/2017  . Essential hypertension 04/14/2017  . Intermittent asthma without complication [id number]  . Lumbar  radiculopathy 05/18/2017  . Status post hemilaminotomy 05/18/2017  . Lumbar degenerative disc disease 05/18/2017  . Spinal stenosis of lumbar region without neurogenic  claudication 05/18/2017  . Tendinitis of right knee 07/03/2017  . Arm pain, right 11/08/2017  . Renal insufficiency 11/08/2017  . Colon cancer screening   . Amaurosis fugax of right eye 01/13/2019   Resolved Ambulatory Problems    Diagnosis Date Noted  . No Resolved Ambulatory Problems   Past Medical History:  Diagnosis Date  . Arthritis   . Asthma   . GERD (gastroesophageal reflux disease)   . Herniated disc, cervical   . Left wrist fracture   . Stroke ([place]) 1998      MILD INTERMITTENT ASTHMA-seems to be controlled at this time Continue striverdi and Flovent Albuterol as needed Avoid triggers(smoke, perfumes,chemicals,dust)  Allergic rhinitis Continue antihistamine as prescribed On singulair  GERD Continue PPI   Obesity -recommend significant weight loss -recommend changing diet  Deconditioned state -Recommend increased daily activity and exercise    COVID-19 EDUCATION: The signs and symptoms of COVID-19 were discussed with the patient and how to seek care for testing.  The importance of social distancing was discussed today. Hand Washing Techniques and avoid touching face was advised.  MEDICATION ADJUSTMENTS/LABS AND TESTS ORDERED: continue inhalers as prescribed Avoid triggers   CURRENT MEDICATIONS REVIEWED AT LENGTH WITH PATIENT TODAY   Patient satisfied with Plan of action and management. All questions answered  Follow up in 6 months  TOTAL TIME SPENT 24 mins  Maretta Bees Patricia Pesa, M.D.  [facility]  Medical Director [place] Director [facility] Cardio-Pulmonary Department

## 2019-01-22 NOTE — Patient Instructions (Signed)
Continue inhalers as prescribed Avoid triggers     Asthma, Adult  Asthma is a long-term (chronic) condition in which the airways get tight and narrow. The airways are the breathing passages that lead from the nose and mouth down into the lungs. A person with asthma will have times when symptoms get worse. These are called asthma attacks. They can cause coughing, whistling sounds when you breathe (wheezing), shortness of breath, and chest pain. They can make it hard to breathe. There is no cure for asthma, but medicines and lifestyle changes can help control it. There are many things that can bring on an asthma attack or make asthma symptoms worse (triggers). Common triggers include:  Mold.  Dust.  Cigarette smoke.  Cockroaches.  Things that can cause allergy symptoms (allergens). These include animal skin flakes (dander) and pollen from trees or grass.  Things that pollute the air. These may include household cleaners, wood smoke, smog, or chemical odors.  Cold air, weather changes, and wind.  Crying or laughing hard.  Stress.  Certain medicines or drugs.  Certain foods such as dried fruit, potato chips, and grape juice.  Infections, such as a cold or the flu.  Certain medical conditions or diseases.  Exercise or tiring activities. Asthma may be treated with medicines and by staying away from the things that cause asthma attacks. Types of medicines may include:  Controller medicines. These help prevent asthma symptoms. They are usually taken every day.  Fast-acting reliever or rescue medicines. These quickly relieve asthma symptoms. They are used as needed and provide short-term relief.  Allergy medicines if your attacks are brought on by allergens.  Medicines to help control the body's defense (immune) system. Follow these instructions at home: Avoiding triggers in your home  Change your heating and air conditioning filter often.  Limit your use of fireplaces and  wood stoves.  Get rid of pests (such as roaches and mice) and their droppings.  Throw away plants if you see mold on them.  Clean your floors. Dust regularly. Use cleaning products that do not smell.  Have someone vacuum when you are not home. Use a vacuum cleaner with a HEPA filter if possible.  Replace carpet with wood, tile, or vinyl flooring. Carpet can trap animal skin flakes and dust.  Use allergy-proof pillows, mattress covers, and box spring covers.  Wash bed sheets and blankets every week in hot water. Dry them in a dryer.  Keep your bedroom free of any triggers.  Avoid pets and keep windows closed when things that cause allergy symptoms are in the air.  Use blankets that are made of polyester or cotton.  Clean bathrooms and kitchens with bleach. If possible, have someone repaint the walls in these rooms with mold-resistant paint. Keep out of the rooms that are being cleaned and painted.  Wash your hands often with soap and water. If soap and water are not available, use hand sanitizer.  Do not allow anyone to smoke in your home. General instructions  Take over-the-counter and prescription medicines only as told by your doctor. ? Talk with your doctor if you have questions about how or when to take your medicines. ? Make note if you need to use your medicines more often than usual.  Do not use any products that contain nicotine or tobacco, such as cigarettes and e-cigarettes. If you need help quitting, ask your doctor.  Stay away from secondhand smoke.  Avoid doing things outdoors when allergen counts are high and  when air quality is low.  Wear a ski mask when doing outdoor activities in the winter. The mask should cover your nose and mouth. Exercise indoors on cold days if you can.  Warm up before you exercise. Take time to cool down after exercise.  Use a peak flow meter as told by your doctor. A peak flow meter is a tool that measures how well the lungs are  working.  Keep track of the peak flow meter's readings. Write them down.  Follow your asthma action plan. This is a written plan for taking care of your asthma and treating your attacks.  Make sure you get all the shots (vaccines) that your doctor recommends. Ask your doctor about a flu shot and a pneumonia shot.  Keep all follow-up visits as told by your doctor. This is important. Contact a doctor if:  You have wheezing, shortness of breath, or a cough even while taking medicine to prevent attacks.  The mucus you cough up (sputum) is thicker than usual.  The mucus you cough up changes from clear or white to yellow, green, gray, or bloody.  You have problems from the medicine you are taking, such as: ? A rash. ? Itching. ? Swelling. ? Trouble breathing.  You need reliever medicines more than 2-3 times a week.  Your peak flow reading is still at 50-79% of your personal best after following the action plan for 1 hour.  You have a fever. Get help right away if:  You seem to be worse and are not responding to medicine during an asthma attack.  You are short of breath even at rest.  You get short of breath when doing very little activity.  You have trouble eating, drinking, or talking.  You have chest pain or tightness.  You have a fast heartbeat.  Your lips or fingernails start to turn blue.  You are light-headed or dizzy, or you faint.  Your peak flow is less than 50% of your personal best.  You feel too tired to breathe normally. Summary  Asthma is a long-term (chronic) condition in which the airways get tight and narrow. An asthma attack can make it hard to breathe.  Asthma cannot be cured, but medicines and lifestyle changes can help control it.  Make sure you understand how to avoid triggers and how and when to use your medicines. This information is not intended to replace advice given to you by your health care provider. Make sure you discuss any questions  you have with your health care provider. Document Released: 11/02/2007 Document Revised: 07/19/2018 Document Reviewed: 06/20/2016 Elsevier Patient Education  2020 [employer].

## 2019-01-22 NOTE — Telephone Encounter (Signed)
-----   Message from Flora Lipps, MD sent at 01/22/2019  8:44 AM EDT ----- Regarding: aero chamber Hi Audrey Martinez  Can you order a spacer aero chamber for this patient  For some reason I can not find the order

## 2019-01-23 ENCOUNTER — Ambulatory Visit: Payer: Medicare Other | Admitting: Physical Therapy

## 2019-01-23 ENCOUNTER — Other Ambulatory Visit: Payer: Self-pay | Admitting: Internal Medicine

## 2019-01-23 DIAGNOSIS — M79601 Pain in right arm: Secondary | ICD-10-CM

## 2019-01-28 ENCOUNTER — Other Ambulatory Visit: Payer: Self-pay | Admitting: Internal Medicine

## 2019-01-28 ENCOUNTER — Ambulatory Visit (INDEPENDENT_AMBULATORY_CARE_PROVIDER_SITE_OTHER): Payer: Medicare Other

## 2019-01-28 ENCOUNTER — Telehealth: Payer: Self-pay

## 2019-01-28 VITALS — BP 118/78 | HR 96 | Temp 97.7°F | Ht 66.0 in | Wt 217.0 lb

## 2019-01-28 DIAGNOSIS — M810 Age-related osteoporosis without current pathological fracture: Secondary | ICD-10-CM | POA: Diagnosis not present

## 2019-01-28 DIAGNOSIS — Z0000 Encounter for general adult medical examination without abnormal findings: Secondary | ICD-10-CM

## 2019-01-28 MED ORDER — BACLOFEN 10 MG PO TABS: 10.0000 mg | ORAL_TABLET | Freq: Three times a day (TID) | ORAL | 1 refills | Status: DC

## 2019-01-28 NOTE — Addendum Note (Signed)
Addended by: Clemetine Marker D on: 01/28/2019 01:27 PM   Modules accepted: Orders

## 2019-01-28 NOTE — Progress Notes (Signed)
Subjective:   Audrey Martinez is a 73 y.o. female who presents for an Initial Medicare Annual Wellness Visit.  Virtual Visit via Telephone Note  I connected with Audrey Martinez on 01/28/19 at 10:40 AM EDT by telephone and verified that I am speaking with the correct person using two identifiers.  Medicare Annual Wellness visit completed telephonically due to Covid-19 pandemic.   Location: Patient: home Provider: office   I discussed the limitations, risks, security and privacy concerns of performing an evaluation and management service by telephone and the availability of in person appointments. The patient expressed understanding and agreed to proceed.  Some vital signs may be absent or patient reported.   Audrey Marker, LPN    Review of Systems     Cardiac Risk Factors include: advanced age (>1men, >40 women);dyslipidemia;obesity (BMI >30kg/m2)     Objective:    Today's Vitals   01/28/19 1104  BP: 118/78  Pulse: 96  Temp: 97.7 F (36.5 C)  Weight: 217 lb (98.4 kg)  Height: 5\' 6"  (1.676 m)   Body mass index is 35.02 kg/m.  Advanced Directives 01/28/2019 01/01/2019 11/23/2018 10/24/2018 08/22/2018 03/21/2018 06/05/2017  Does Patient Have a Medical Advance Directive? Yes No No No Yes Yes Yes  Type of Paramedic of [place];Living will - - - - Living will;Healthcare Power of [place];Living will  Does patient want to make changes to medical advance directive? - - - - - No - Patient declined -  Copy of [place] in Chart? No - copy requested - - - - No - copy requested -    Current Medications (verified) Outpatient Encounter Medications as of 01/28/2019  Medication Sig   albuterol (PROVENTIL HFA;VENTOLIN HFA) 108 (90 Base) MCG/ACT inhaler Inhale into the lungs every 6 (six) hours as needed for wheezing or shortness of breath.   baclofen (LIORESAL) 10 MG tablet Take 0.5 tablets (5 mg total) by  mouth 2 (two) times daily as needed. (Patient taking differently: Take 10 mg by mouth 3 (three) times daily. )   Bioflavonoid Products (ESTER-C) TABS Take 1 tablet by mouth daily.   calcium-vitamin D (OSCAL WITH D) 500-200 MG-UNIT tablet Take 1 tablet by mouth.   cholecalciferol (VITAMIN D) 400 units TABS tablet Take 400 Units by mouth.   Cranberry 1000 MG CAPS Take by mouth.   diltiazem (CARDIZEM CD) 180 MG 24 hr capsule TAKE 1 CAPSULE (180 MG TOTAL) DAILY BY MOUTH.   diltiazem (CARDIZEM) 60 MG tablet Take 60 mg by mouth 4 (four) times daily. Pt takes 1/2 tablet 30 mg PRN; rarely per patient   fluticasone (FLOVENT HFA) 110 MCG/ACT inhaler TAKE 2 PUFFS BY MOUTH TWICE A DAY   gabapentin (NEURONTIN) 300 MG capsule TAKE 1 CAPSULE (300 MG TOTAL) BY MOUTH 3 (THREE) TIMES DAILY. THREE CAPSULES DAILY AT BEDTIME.   Ginger 500 MG CAPS Take by mouth.   Misc Natural Products (TART CHERRY ADVANCED PO) Take by mouth.   montelukast (SINGULAIR) 10 MG tablet Take 10 mg by mouth every evening.   Multiple Vitamins-Minerals (MULTIVITAMIN WITH MINERALS) tablet Take 1 tablet by mouth daily.   Olodaterol HCl (STRIVERDI RESPIMAT) 2.5 MCG/ACT AERS Striverdi Respimat 2.5 mcg/actuation solution for inhalation   Probiotic Product (PROBIOTIC DAILY PO) Take by mouth.   Turmeric 500 MG CAPS Take by mouth.   [DISCONTINUED] cyclobenzaprine (FLEXERIL) 10 MG tablet Take 10 mg by mouth 3 (three) times daily as needed.   [DISCONTINUED]  diclofenac sodium (VOLTAREN) 1 % GEL    [DISCONTINUED] ezetimibe (ZETIA) 10 MG tablet Take 1 tablet (10 mg total) by mouth daily. (Patient not taking: Reported on 01/28/2019)   [DISCONTINUED] metoprolol tartrate (LOPRESSOR) 100 MG tablet metoprolol tartrate 100 mg tablet   [DISCONTINUED] NON FORMULARY Protleotic Enzymes   [DISCONTINUED] Salmeterol Xinafoate (SEREVENT DISKUS IN) Serevent Diskus 50 mcg/dose powder for inhalation  TAKE 1 PUFF BY MOUTH TWICE A DAY   [DISCONTINUED]  Spacer/Aero-Holding Chambers (AEROCHAMBER MV) inhaler Use as instructed   [DISCONTINUED] STRIVERDI RESPIMAT 2.5 MCG/ACT AERS INHALE 2 PUFFS BY MOUTH INTO THE LUNGS DAILY   No facility-administered encounter medications on file as of 01/28/2019.     Allergies (verified) Aspirin, Ciprofloxacin, Ciprofloxacin-dexamethasone, Crestor [rosuvastatin], Morphine and related, Amoxicillin, Doxycycline hyclate, Hydrochlorothiazide, Keflex [cephalexin], Lovastatin, Simvastatin, Erythromycin, Ibuprofen, Zetia [ezetimibe], Neomycin-bacitracin zn-polymyx, Omeprazole, and Tape   History: Past Medical History:  Diagnosis Date   Arthritis    Asthma    GERD (gastroesophageal reflux disease)    Herniated disc, cervical    Hyperlipidemia    Left wrist fracture    Stroke ([place]) 1998   SVT (supraventricular tachycardia) ([place])    Past Surgical History:  Procedure Laterality Date   BACK SURGERY  2017   L2-4 laminectomy and foraminal stenosis   BREAST CYST ASPIRATION Left [place]  2009   [facility] [place]  2009   CATARACT EXTRACTION, BILATERAL  2010   COLONOSCOPY WITH PROPOFOL N/A 03/21/2018   Procedure: COLONOSCOPY WITH Biopsy;  Surgeon: Lin Landsman, MD;  Location: [facility];  Service: Endoscopy;  Laterality: N/A;   derrick procedure  1977-78   bilateral    ESOPHAGOGASTRODUODENOSCOPY  2013   gastritis; done for complaint of dysphagia   ESOPHAGOGASTRODUODENOSCOPY (EGD) WITH PROPOFOL N/A 03/21/2018   Procedure: ESOPHAGOGASTRODUODENOSCOPY (EGD) WITH Biopsies;  Surgeon: Lin Landsman, MD;  Location: [place] [facility];  Service: Endoscopy;  Laterality: N/A;  KEEP THIS PATIENT FIRST   lymph node removal     neck   MOHS SURGERY  07/2016   BCCA nose   POLYPECTOMY N/A 03/21/2018   Procedure: POLYPECTOMY;  Surgeon: Lin Landsman, MD;  Location: [facility];  Service: Endoscopy;  Laterality: N/A;   REPLACEMENT TOTAL KNEE  2013   RT   TOTAL SHOULDER REPLACEMENT  2012   RT    Family History  Problem Relation Age of Onset   Diabetes Mother    Stroke Mother    Valvular heart disease Father    Breast cancer Neg Hx    Social History   Socioeconomic History   Marital status: Widowed    Spouse name: Not on file   Number of children: 3   Years of education: Not on file   Highest education level: Not on file  Occupational History   Occupation: retired  Scientist, product/process development strain: Not hard at [employer] insecurity    Worry: Never true    Inability: Never true   Transportation needs    Medical: No    Non-medical: No  Tobacco Use   Smoking status: Never Smoker   Smokeless tobacco: Never Used  Substance and Sexual Activity   Alcohol use: Yes    Comment: may drink 1-2x/yr   Drug use: No   Sexual activity: Never  Lifestyle   Physical activity    Days per week: 3 days    Minutes  per session: 30 min   Stress: Not at all  Relationships   Social connections    Talks on phone: More than three times a week    Gets together: Three times a week    Attends religious service: More than 4 times per year    Active member of club or organization: No    Attends meetings of clubs or organizations: Never    Relationship status: Married  Other Topics Concern   Not on file  Social History Narrative   Not on file    Tobacco Counseling Counseling given: Not Answered   Clinical Intake:  Pre-visit preparation completed: Yes  Pain : No/denies pain     BMI - recorded: 35.02 Nutritional Status: BMI > 30  Obese Nutritional Risks: None Diabetes: No  How often do you need to have someone help you when you read instructions, pamphlets, or other written materials from your doctor or pharmacy?: 1 - Never  Interpreter Needed?: No  Information entered by :: Audrey Marker LPN   Activities of Daily  Living In your present state of health, do you have any difficulty performing the following activities: 01/28/2019 03/21/2018  Hearing? N N  Comment declines hearing aids -  Vision? Y N  Difficulty concentrating or making decisions? N N  Walking or climbing stairs? N N  Dressing or bathing? N N  Doing errands, shopping? N -  Preparing Food and eating ? N -  Using the Toilet? N -  In the past six months, have you accidently leaked urine? N -  Do you have problems with loss of bowel control? N -  Managing your Medications? N -  Managing your Finances? N -  Housekeeping or managing your Housekeeping? N -  Some recent data might be hidden     Immunizations and Health Maintenance Immunization History  Administered Date(s) Administered   Pneumococcal Conjugate-13 07/22/2015   Pneumococcal Polysaccharide-23 07/22/2013   Td 03/03/2002   Health Maintenance Due  Topic Date Due   MAMMOGRAM  12/15/2018   INFLUENZA VACCINE  12/29/2018    Patient Care Team: Glean Hess, MD as PCP - General (Internal Medicine) Flora Lipps, MD as Consulting Physician (Pulmonary Disease) Gillis Santa, MD as Consulting Physician (Pain Medicine) Earnestine Leys, MD (Orthopedic Surgery) Minna Merritts, MD as Consulting Physician (Cardiology)  Indicate any recent Medical Services you may have received from other than [facility] providers in the past year (date may be approximate).     Assessment:   This is a routine wellness examination for Wendie.  Hearing/Vision screen  Hearing Screening   125Hz  250Hz  500Hz  1000Hz  2000Hz  3000Hz  4000Hz  6000Hz  8000Hz   Right ear:           Left ear:           Comments: Pt had hearing evaluation at [facility] earlier this year and does not require hearing aids  Vision Screening Comments: Annual vision screenings done at [facility] Dr. Edison Pace  Dietary issues and exercise activities discussed: Current Exercise Habits: Home exercise routine, Type of  exercise: walking, Time (Minutes): 30, Frequency (Times/Week): 3, Weekly Exercise (Minutes/Week): 90, Intensity: Moderate, Exercise limited by: respiratory conditions(s);cardiac condition(s)  Goals     Patient Stated     Pt is leading women's Bible Study and would like to continue in a Godly way      Depression Screen PHQ 2/9 Scores 01/28/2019 11/15/2018 11/08/2017 07/04/2017 05/18/2017 05/03/2017 03/21/2017  PHQ - 2 Score 0 0 0 0  0 0 0    Fall Risk Fall Risk  01/28/2019 11/15/2018 11/08/2017 07/04/2017 06/05/2017  Falls in the past year? 0 0 No No No  Number falls in past yr: 0 0 - - -  Injury with Fall? 0 0 - - -  Risk for fall due to : Impaired vision - - - -  Follow up Falls prevention discussed Falls evaluation completed - - -    FALL RISK PREVENTION PERTAINING TO THE HOME:  Any stairs in or around the home? No If so, do they handrails? No  Home free of loose throw rugs in walkways, pet beds, electrical cords, etc? Yes  Adequate lighting in your home to reduce risk of falls? Yes   ASSISTIVE DEVICES UTILIZED TO PREVENT FALLS:  Life alert? No  Use of a cane, walker or w/c? No  Grab bars in the bathroom? No Shower chair or bench in shower? No  Elevated toilet seat or a handicapped toilet? No   DME ORDERS:  DME order needed?  No   TIMED UP AND GO:  Was the test performed? No . Telephonic visit.   Education: Fall risk prevention has been discussed.  Intervention(s) required? No   Cognitive Function:     6CIT Screen 01/28/2019  What Year? 0 points  What month? 0 points  What time? 0 points  Count back from 20 0 points  Months in reverse 2 points  Repeat phrase 0 points  Total Score 2    Screening Tests Health Maintenance  Topic Date Due   MAMMOGRAM  12/15/2018   INFLUENZA VACCINE  12/29/2018   TETANUS/TDAP  11/15/2019 (Originally 03/03/2012)   COLONOSCOPY  03/21/2021   DEXA SCAN  Completed   Hepatitis C Screening  Completed   PNA vac Low Risk Adult   Completed    Qualifies for Shingles Vaccine? Yes  . Due for Shingrix. Education has been provided regarding the importance of this vaccine. Pt has been advised to call insurance company to determine out of pocket expense. Advised may also receive vaccine at local pharmacy or Health Dept. Verbalized acceptance and understanding.  Tdap: Although this vaccine is not a covered service during a Wellness Exam, does the patient still wish to receive this vaccine today?  No .  Education has been provided regarding the importance of this vaccine. Advised may receive this vaccine at local pharmacy or Health Dept. Aware to provide a copy of the vaccination record if obtained from local pharmacy or Health Dept. Verbalized acceptance and understanding.  Flu Vaccine: Due for Flu vaccine. Does the patient want to receive this vaccine today?  No . Education has been provided regarding the importance of this vaccine but still declined. Advised may receive this vaccine at local pharmacy or Health Dept. Aware to provide a copy of the vaccination record if obtained from local pharmacy or Health Dept. Verbalized acceptance and understanding.  Pneumococcal Vaccine: Up to date    Cancer Screenings:  Colorectal Screening: Completed 03/21/18. Repeat every 3 years;   Mammogram: Completed 12/28/17. Repeat every year; Ordered 11/15/18. Pt provided with contact information and advised to call to schedule appt.   Bone Density: Completed 10/31/14. Results reflect OSTEOPOROSIS. Repeat every 2 years. Ordered today. Pt provided with contact information and advised to call to schedule appt.   Lung Cancer Screening: (Low Dose CT Chest recommended if Age 81-80 years, 30 pack-year currently smoking OR have quit w/in 15years.) does not qualify.    Additional Screening:  Hepatitis C  Screening: does qualify; Completed 11/15/18  Vision Screening: Recommended annual ophthalmology exams for early detection of glaucoma and other  disorders of the eye. Is the patient up to date with their annual eye exam?  Yes  Who is the provider or what is the name of the office in which the pt attends annual eye exams? Dr. Edison Pace [facility]  Dental Screening: Recommended annual dental exams for proper oral hygiene  Community Resource Referral:  CRR required this visit?  No      Plan:    I have personally reviewed and addressed the Medicare Annual Wellness questionnaire and have noted the following in the patients chart:  A. Medical and social history B. Use of alcohol, tobacco or illicit drugs  C. Current medications and supplements D. Functional ability and status E.  Nutritional status F.  Physical activity G. Advance directives H. List of other physicians I.  Hospitalizations, surgeries, and ER visits in previous 12 months J.  Coffee such as hearing and vision if needed, cognitive and depression L. Referrals and appointments   In addition, I have reviewed and discussed with patient certain preventive protocols, quality metrics, and best practice recommendations. A written personalized care plan for preventive services as well as general preventive health recommendations were provided to patient.   Signed,  Audrey Marker, LPN Nurse Health Advisor   Nurse Notes: pt doing well and appreciative of visit today. Telephone encounter sent to Dr. Army Melia for medication direction clarification regarding baclofen. Pt states she is taking 10 mg TID with gabapentin but recent rx sent in was for 5mg  BID. Pt also stopped taking zetia due to reaction of blurry eyes and affecting memory. She had complete loss of vision in right eye earlier this month but it did return after a few minutes and she is following up with ophthalmology.

## 2019-01-28 NOTE — Telephone Encounter (Signed)
Completed virtual visit with patient today for AWV. Pt states she is taking Baclofen 10 mg three times daily with her gabapentin. Rx sent on 8/26 with directions of 1/2 tablet (5 mg) twice daily. Pt was dispensed only 90 tablets as that would be a 90 day supply. Pt states TID is how she has been taking it since shoulder replacement. Please clarify if directions need to be changed or contact pharmacy to update rx. Thank you!

## 2019-01-28 NOTE — Patient Instructions (Signed)
[name] , Thank you for taking time to come for your Medicare Wellness Visit. I appreciate your ongoing commitment to your health goals. Please review the following plan we discussed and let me know if I can assist you in the future.   Screening recommendations/referrals: Colonoscopy: done 03/21/18. Repeat in 2022. Mammogram: done 12/28/17. Please call [phone] to schedule your mammogram and bone density screening.  Bone Density: done 10/31/14.  Recommended yearly ophthalmology/optometry visit for glaucoma screening and checkup Recommended yearly dental visit for hygiene and checkup  Vaccinations: Influenza vaccine: postponed Pneumococcal vaccine: done 07/22/15 Tdap vaccine: due - please contact us if you get a cut or scrape Shingles vaccine: Shingrix discussed. Please contact your pharmacy for coverage information.   Advanced directives: Please bring a copy of your health care power of attorney and living will to the office at your convenience.  Conditions/risks identified: Recommend drinking 6-8 glasses of water per day.   Next appointment: Please follow up in one year for your Medicare Annual Wellness visit.     Preventive Care 71 Years and Older, Female Preventive care refers to lifestyle choices and visits with your health care provider that can promote health and wellness. What does preventive care include?  A yearly physical exam. This is also called an annual well check.  Dental exams once or twice a year.  Routine eye exams. Ask your health care provider how often you should have your eyes checked.  Personal lifestyle choices, including:  Daily care of your teeth and gums.  Regular physical activity.  Eating a healthy diet.  Avoiding tobacco and drug use.  Limiting alcohol use.  Practicing safe sex.  Taking low-dose aspirin every day.  Taking vitamin and mineral supplements as recommended by your health care provider. What happens during an annual well  check? The services and screenings done by your health care provider during your annual well check will depend on your age, overall health, lifestyle risk factors, and family history of disease. Counseling  Your health care provider may ask you questions about your:  Alcohol use.  Tobacco use.  Drug use.  Emotional well-being.  Home and relationship well-being.  Sexual activity.  Eating habits.  History of falls.  Memory and ability to understand (cognition).  Work and work Statistician.  Reproductive health. Screening  You may have the following tests or measurements:  Height, weight, and BMI.  Blood pressure.  Lipid and cholesterol levels. These may be checked every 5 years, or more frequently if you are over 30 years old.  Skin check.  Lung cancer screening. You may have this screening every year starting at age 71 if you have a 30-pack-year history of smoking and currently smoke or have quit within the past 15 years.  Fecal occult blood test (FOBT) of the stool. You may have this test every year starting at age 71.  Flexible sigmoidoscopy or colonoscopy. You may have a sigmoidoscopy every 5 years or a colonoscopy every 10 years starting at age 71.  Hepatitis C blood test.  Hepatitis B blood test.  Sexually transmitted disease (STD) testing.  Diabetes screening. This is done by checking your blood sugar (glucose) after you have not eaten for a while (fasting). You may have this done every 1-3 years.  Bone density scan. This is done to screen for osteoporosis. You may have this done starting at age 71.  Mammogram. This may be done every 1-2 years. Talk to your health care provider about how often you should  have regular mammograms. Talk with your health care provider about your test results, treatment options, and if necessary, the need for more tests. Vaccines  Your health care provider may recommend certain vaccines, such as:  Influenza vaccine. This is  recommended every year.  Tetanus, diphtheria, and acellular pertussis (Tdap, Td) vaccine. You may need a Td booster every 10 years.  Zoster vaccine. You may need this after age 71.  Pneumococcal 13-valent conjugate (PCV13) vaccine. One dose is recommended after age 71.  Pneumococcal polysaccharide (PPSV23) vaccine. One dose is recommended after age 71. Talk to your health care provider about which screenings and vaccines you need and how often you need them. This information is not intended to replace advice given to you by your health care provider. Make sure you discuss any questions you have with your health care provider. Document Released: 06/12/2015 Document Revised: 02/03/2016 Document Reviewed: 03/17/2015 Elsevier Interactive Patient Education  2017 [employer] Prevention in the Home Falls can cause injuries. They can happen to people of all ages. There are many things you can do to make your home safe and to help prevent falls. What can I do on the outside of my home?  Regularly fix the edges of walkways and driveways and fix any cracks.  Remove anything that might make you trip as you walk through a door, such as a raised step or threshold.  Trim any bushes or trees on the path to your home.  Use bright outdoor lighting.  Clear any walking paths of anything that might make someone trip, such as rocks or tools.  Regularly check to see if handrails are loose or broken. Make sure that both sides of any steps have handrails.  Any raised decks and porches should have guardrails on the edges.  Have any leaves, snow, or ice cleared regularly.  Use sand or salt on walking paths during winter.  Clean up any spills in your garage right away. This includes oil or grease spills. What can I do in the bathroom?  Use night lights.  Install grab bars by the toilet and in the tub and shower. Do not use towel bars as grab bars.  Use non-skid mats or decals in the tub or  shower.  If you need to sit down in the shower, use a plastic, non-slip stool.  Keep the floor dry. Clean up any water that spills on the floor as soon as it happens.  Remove soap buildup in the tub or shower regularly.  Attach bath mats securely with double-sided non-slip rug tape.  Do not have throw rugs and other things on the floor that can make you trip. What can I do in the bedroom?  Use night lights.  Make sure that you have a light by your bed that is easy to reach.  Do not use any sheets or blankets that are too big for your bed. They should not hang down onto the floor.  Have a firm chair that has side arms. You can use this for support while you get dressed.  Do not have throw rugs and other things on the floor that can make you trip. What can I do in the kitchen?  Clean up any spills right away.  Avoid walking on wet floors.  Keep items that you use a lot in easy-to-reach places.  If you need to reach something above you, use a strong step stool that has a grab bar.  Keep electrical cords out of  the way.  Do not use floor polish or wax that makes floors slippery. If you must use wax, use non-skid floor wax.  Do not have throw rugs and other things on the floor that can make you trip. What can I do with my stairs?  Do not leave any items on the stairs.  Make sure that there are handrails on both sides of the stairs and use them. Fix handrails that are broken or loose. Make sure that handrails are as long as the stairways.  Check any carpeting to make sure that it is firmly attached to the stairs. Fix any carpet that is loose or worn.  Avoid having throw rugs at the top or bottom of the stairs. If you do have throw rugs, attach them to the floor with carpet tape.  Make sure that you have a light switch at the top of the stairs and the bottom of the stairs. If you do not have them, ask someone to add them for you. What else can I do to help prevent falls?   Wear shoes that:  Do not have high heels.  Have rubber bottoms.  Are comfortable and fit you well.  Are closed at the toe. Do not wear sandals.  If you use a stepladder:  Make sure that it is fully opened. Do not climb a closed stepladder.  Make sure that both sides of the stepladder are locked into place.  Ask someone to hold it for you, if possible.  Clearly mark and make sure that you can see:  Any grab bars or handrails.  First and last steps.  Where the edge of each step is.  Use tools that help you move around (mobility aids) if they are needed. These include:  Canes.  Walkers.  Scooters.  Crutches.  Turn on the lights when you go into a dark area. Replace any light bulbs as soon as they burn out.  Set up your furniture so you have a clear path. Avoid moving your furniture around.  If any of your floors are uneven, fix them.  If there are any pets around you, be aware of where they are.  Review your medicines with your doctor. Some medicines can make you feel dizzy. This can increase your chance of falling. Ask your doctor what other things that you can do to help prevent falls. This information is not intended to replace advice given to you by your health care provider. Make sure you discuss any questions you have with your health care provider. Document Released: 03/12/2009 Document Revised: 10/22/2015 Document Reviewed: 06/20/2014 Elsevier Interactive Patient Education  2017 [employer].

## 2019-01-30 ENCOUNTER — Ambulatory Visit: Payer: Medicare Other | Admitting: Physical Therapy

## 2019-01-30 DIAGNOSIS — G453 Amaurosis fugax: Secondary | ICD-10-CM | POA: Diagnosis not present

## 2019-02-01 ENCOUNTER — Ambulatory Visit: Payer: Medicare Other

## 2019-02-07 ENCOUNTER — Encounter: Payer: Self-pay | Admitting: Physician Assistant

## 2019-02-07 ENCOUNTER — Telehealth: Payer: Self-pay | Admitting: Cardiovascular Disease

## 2019-02-07 ENCOUNTER — Ambulatory Visit (INDEPENDENT_AMBULATORY_CARE_PROVIDER_SITE_OTHER): Payer: Medicare Other | Admitting: Physician Assistant

## 2019-02-07 ENCOUNTER — Ambulatory Visit (INDEPENDENT_AMBULATORY_CARE_PROVIDER_SITE_OTHER): Payer: Medicare Other

## 2019-02-07 ENCOUNTER — Other Ambulatory Visit: Payer: Self-pay

## 2019-02-07 VITALS — BP 132/84 | HR 101 | Ht 66.0 in | Wt 221.0 lb

## 2019-02-07 DIAGNOSIS — Z8673 Personal history of transient ischemic attack (TIA), and cerebral infarction without residual deficits: Secondary | ICD-10-CM

## 2019-02-07 DIAGNOSIS — I10 Essential (primary) hypertension: Secondary | ICD-10-CM

## 2019-02-07 DIAGNOSIS — G453 Amaurosis fugax: Secondary | ICD-10-CM | POA: Diagnosis not present

## 2019-02-07 DIAGNOSIS — E785 Hyperlipidemia, unspecified: Secondary | ICD-10-CM

## 2019-02-07 DIAGNOSIS — I519 Heart disease, unspecified: Secondary | ICD-10-CM | POA: Diagnosis not present

## 2019-02-07 DIAGNOSIS — I428 Other cardiomyopathies: Secondary | ICD-10-CM

## 2019-02-07 DIAGNOSIS — H53129 Transient visual loss, unspecified eye: Secondary | ICD-10-CM | POA: Diagnosis not present

## 2019-02-07 DIAGNOSIS — I471 Supraventricular tachycardia: Secondary | ICD-10-CM

## 2019-02-07 NOTE — Patient Instructions (Signed)
Medication Instructions:  Your physician recommends that you continue on your current medications as directed. Please refer to the Current Medication list given to you today.  If you need a refill on your cardiac medications before your next appointment, please call your pharmacy.   Lab work: None ordered  If you have labs (blood work) drawn today and your tests are completely normal, you will receive your results only by: Marland Kitchen MyChart Message (if you have MyChart) OR . A paper copy in the mail If you have any lab test that is abnormal or we need to change your treatment, we will call you to review the results.  Testing/Procedures: 1- A zio monitor was placed today. It will remain on for 14 days. You will then return monitor and event diary in provided box. It takes 1-2 weeks for report to be downloaded and returned to [place]. We will call you with the results. If monitor falls of or has orange flashing light, please call [employer] for further instructions.      2- Zio AT: We will place order and you will receive it in the mail.  You may get a call from [employer] @ either  [phone] Or  [phone] for them to confirm your address before it will be sent to you.  You will wear the monitor for 14 days, remove it and send it back to the company. They will send [place] a report. Then we will call you with the results.  3- Echo Echo  Please return to [facility] on ______________ at _______________ AM/PM for an Echocardiogram. Your physician has requested that you have an echocardiogram. Echocardiography is a painless test that uses sound waves to create images of your heart. It provides your doctor with information about the size and shape of your heart and how well your heart's chambers and valves are working. This procedure takes approximately one hour. There are no restrictions for this procedure. Please note; depending on visual quality an IV may need to be placed.   Follow-Up: At [facility], you and your health needs are our priority.  As part of our continuing mission to provide you with exceptional heart care, we have created designated Provider Care Teams.  These Care Teams include your primary Cardiologist (physician) and Advanced Practice Providers (APPs -  Physician Assistants and Nurse Practitioners) who all work together to provide you with the care you need, when you need it. You will need a follow up appointment in 4-6 weeks. You may see Ida Rogue, MD or Christell Faith, PA-C.

## 2019-02-07 NOTE — Progress Notes (Signed)
Cardiology Office Note    Date:  02/07/2019   ID:  Audrey Martinez, Audrey Martinez 10/08/1945, MRN [id number]  PCP:  Glean Hess, MD  Cardiologist:  Ida Rogue, MD  Electrophysiologist:  None   Chief Complaint: Visual disturbance  History of Present Illness:   Audrey Martinez is a 73 y.o. female with history of  SVT/atrial tachycardia status post ablation x2 as detailed below, possible atrial flutter with patient declining anticoagulation in the past, TIA, recent right-sided vision loss/disturbance felt to be suspected amaurosis fugax in the ED, hypertension, hyperlipidemia, asthma, migraine disorder, and GERD who presents for evaluation of right-sided visual disturbance.  Patient previously followed by cardiology in [place].  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  No sustained atrial tach/no inducible atrial flutter.  She underwent successful radiofrequency ablation of AV nodal slow pathway.  Following this, she had recurrent symptoms shortly thereafter and had an event monitor and repeat EP study on 01/21/2008: Markedly prolonged at baseline AH interval (AV nodal function is subnormal).  No inducible tachycardia; only inducible double echo beats.  As per EP, event monitor with ectopic atrial tachycardia versus atrial flutter with 2-1 block.  Notes indicated she did not want anticoagulation.  Prior echo from 12/13/2006 showed an LVEF of 55 to 60%, no significant valvular abnormalities.  Notes indicate she was noted to have episodes of atrial flutter at a controlled rate.  ETT on 09/28/2007 showed Bruce protocol with the patient exercising for 5-minutes and 1 second achieving 7 METS with a maximum heart rate of 179 bpm (111% MPHR).  Pretest EKG was normal with a heart rate of 96 bpm.  No chest pain noted during test.  No significant ischemia was noted.  Stress echo on 11/14/2008 showed Bruce protocol with the patient exercising for 4  minutes and 31 seconds achieving 4.6 METS and 113% MPHR.  Echocardiogram showed an LVEF of 60% with no significant ischemia.  She established with Dr. Rockey Situ on 04/14/2017 as a self-referral and denied any symptoms of chest pain concerning for angina.  She reported having periodic palpitations.  It was recommended she increase her diltiazem to 180 mg daily.  She has declined statin for her hyperlipidemia as well as aspirin, Plavix, or anticoagulation in the setting of her TIA and reported atrial flutter.   She was seen in late 2019 with tachypalpitations with outpatient cardiac monitoring showing sinus rhythm with an average heart of 87 bpm, first-degree AV block, type I second-degree AV block, 2 runs of NSVT with the fastest and longest interval lasting 16 beats, isolated PACs, and atrial triplets with rare isolated PVCs, ventricular couplets and triplets, ventricular bigeminy was noted.  Patient triggered events were not associated with significant arrhythmia.  Follow-up echo showed an EF of 45 to 50%, mild diffuse hypokinesis with select images showing possible anteroseptal and apical hypokinesis, mild aortic regurgitation, mild mitral regurgitation, normal size left atrium, normal RV systolic function, normal PASP.  Subsequent Lexiscan Myoview in 05/2018 showed no significant ischemia with a moderate size region of predominantly fixed perfusion defect of mild to moderate intensity of the mid to distal anterior and anterior septal wall unable to exclude breast attenuation artifact, EF 41%, overall low to moderate risk scan.  More recently, the patient was seen in the ED on 01/02/2019 with acute onset of right eye vision loss lasting for approximately 3 to 5 minutes followed by resolution.  CT head and MRI brain showed  frontal lobe encephalomalacia with chronic microvascular ischemia with no acute intracranial abnormality noted.  Symptoms were felt to be related to amaurosis fugax.  She has followed up with  ophthalmology with subsequent carotid artery ultrasound on 01/11/2019 showing minor carotid artery atherosclerosis with no hemodynamically significant internal carotid artery stenosis and antegrade vertebral artery flow bilaterally.  Patient subsequently had recurrence of right eye vision disturbance on 8/23 with spontaneous resolution.  She contacted our office earlier this morning with regards to the above as well as noted tachycardic heart rates documented at 120 bpm with BP reported at 132/92.  Patient comes in for evaluation of the above visual disturbances.  She indicates on 8/5 she had acute onset of vision loss affecting the right eye lasting for approximately 3 to 5 minutes as outlined above with spontaneous resolution.  Following this, she had the development of worsening vision affecting the left eye on 8/23 (though never with a total vision loss) with symptoms lasting for approximately 3 to 5 minutes with spontaneous resolution.  She attributes this to more so "blurry vision" rather than vision loss.  In this setting, she discontinued her recently started Zetia as she was concerned that this was playing a role in her vision disturbances.  Following the discontinuation of this medication she did well up until around 12:20 PM today when she again felt like she had some blurry vision though this time feels like it may have been in the bilateral eyes though is uncertain.  Again his symptoms lasted for approximately 3 to 5 minutes followed by spontaneous resolution.  Since then, she has been asymptomatic.  She denies any chest pain, shortness of breath, palpitations, dizziness, presyncope, or syncope.  No falls, BRBPR, or melena.  We again revisited her history dating back to her time in [place].  Currently, she is asymptomatic.    Past Medical History:  Diagnosis Date   Arthritis    Asthma    GERD (gastroesophageal reflux disease)    Herniated disc, cervical    Hyperlipidemia    Left  wrist fracture    Osteoporosis    Stroke ([place]) 1998   SVT (supraventricular tachycardia) ([place])     Past Surgical History:  Procedure Laterality Date   BACK SURGERY  2017   L2-4 laminectomy and foraminal stenosis   BREAST CYST ASPIRATION Left 1977   CARDIAC CATHETERIZATION  2009   [facility] in [place]  2009   CATARACT EXTRACTION, BILATERAL  2010   COLONOSCOPY WITH PROPOFOL N/A 03/21/2018   Procedure: COLONOSCOPY WITH Biopsy;  Surgeon: Lin Landsman, MD;  Location: [facility];  Service: Endoscopy;  Laterality: N/A;   derrick procedure  1977-78   bilateral    ESOPHAGOGASTRODUODENOSCOPY  2013   gastritis; done for complaint of dysphagia   ESOPHAGOGASTRODUODENOSCOPY (EGD) WITH PROPOFOL N/A 03/21/2018   Procedure: ESOPHAGOGASTRODUODENOSCOPY (EGD) WITH Biopsies;  Surgeon: Lin Landsman, MD;  Location: [facility];  Service: Endoscopy;  Laterality: N/A;  KEEP THIS PATIENT FIRST   lymph node removal     neck   MOHS SURGERY  07/2016   BCCA nose   POLYPECTOMY N/A 03/21/2018   Procedure: POLYPECTOMY;  Surgeon: Lin Landsman, MD;  Location: [facility];  Service: Endoscopy;  Laterality: N/A;   REPLACEMENT TOTAL KNEE  2013   RT   TOTAL SHOULDER REPLACEMENT  2012   RT     Current Medications: Current Meds  Medication Sig  albuterol (PROVENTIL HFA;VENTOLIN HFA) 108 (90 Base) MCG/ACT inhaler Inhale into the lungs every 6 (six) hours as needed for wheezing or shortness of breath.   baclofen (LIORESAL) 10 MG tablet Take 1 tablet (10 mg total) by mouth 3 (three) times daily.   Bioflavonoid Products (ESTER-C) TABS Take 1 tablet by mouth daily.   calcium-vitamin D (OSCAL WITH D) 500-200 MG-UNIT tablet Take 1 tablet by mouth.   cholecalciferol (VITAMIN D) 400 units TABS tablet Take 400 Units by mouth.   Cranberry 1000 MG CAPS Take by mouth.   diltiazem  (CARDIZEM CD) 180 MG 24 hr capsule TAKE 1 CAPSULE (180 MG TOTAL) DAILY BY MOUTH.   diltiazem (CARDIZEM) 60 MG tablet Take 60 mg by mouth 4 (four) times daily. Pt takes 1/2 tablet 30 mg PRN; rarely per patient   fluticasone (FLOVENT HFA) 110 MCG/ACT inhaler TAKE 2 PUFFS BY MOUTH TWICE A DAY   gabapentin (NEURONTIN) 300 MG capsule TAKE 1 CAPSULE (300 MG TOTAL) BY MOUTH 3 (THREE) TIMES DAILY. THREE CAPSULES DAILY AT BEDTIME.   Ginger 500 MG CAPS Take by mouth.   Misc Natural Products (TART CHERRY ADVANCED PO) Take by mouth.   montelukast (SINGULAIR) 10 MG tablet Take 10 mg by mouth every evening.   Multiple Vitamins-Minerals (MULTIVITAMIN WITH MINERALS) tablet Take 1 tablet by mouth daily.   Olodaterol HCl (STRIVERDI RESPIMAT) 2.5 MCG/ACT AERS Striverdi Respimat 2.5 mcg/actuation solution for inhalation   Probiotic Product (PROBIOTIC DAILY PO) Take by mouth.   Turmeric 500 MG CAPS Take by mouth.    Allergies:   Aspirin, Ciprofloxacin, Ciprofloxacin-dexamethasone, Crestor [rosuvastatin], Morphine and related, Amoxicillin, Doxycycline hyclate, Hydrochlorothiazide, Keflex [cephalexin], Lovastatin, Simvastatin, Erythromycin, Ibuprofen, Zetia [ezetimibe], Neomycin-bacitracin zn-polymyx, Omeprazole, and Tape   Social History   Socioeconomic History   Marital status: Widowed    Spouse name: Not on file   Number of children: 3   Years of education: Not on file   Highest education level: Not on file  Occupational History   Occupation: retired  Scientist, product/process development strain: Not hard at [employer] insecurity    Worry: Never true    Inability: Never true   Transportation needs    Medical: No    Non-medical: No  Tobacco Use   Smoking status: Never Smoker   Smokeless tobacco: Never Used  Substance and Sexual Activity   Alcohol use: Yes    Comment: may drink 1-2x/yr   Drug use: No   Sexual activity: Never  Lifestyle   Physical activity    Days per week: 3  days    Minutes per session: 30 min   Stress: Not at all  Relationships   Social connections    Talks on phone: More than three times a week    Gets together: Three times a week    Attends religious service: More than 4 times per year    Active member of club or organization: No    Attends meetings of clubs or organizations: Never    Relationship status: Married  Other Topics Concern   Not on file  Social History Narrative   Not on file     Family History:  The patient's family history includes Diabetes in her mother; Stroke in her mother; Valvular heart disease in her father. There is no history of Breast cancer.  ROS:   Review of Systems  Constitutional: Negative for chills, diaphoresis, fever, malaise/fatigue and weight loss.  HENT: Negative for congestion.   Eyes:  Positive for blurred vision. Negative for discharge and redness.       Intermittent vision loss as outlined above  Respiratory: Negative for cough, hemoptysis, sputum production, shortness of breath and wheezing.   Cardiovascular: Negative for chest pain, palpitations, orthopnea, claudication, leg swelling and PND.  Gastrointestinal: Negative for abdominal pain, blood in stool, heartburn, melena, nausea and vomiting.  Genitourinary: Negative for hematuria.  Musculoskeletal: Negative for falls and myalgias.  Skin: Negative for rash.  Neurological: Negative for dizziness, tingling, tremors, sensory change, speech change, focal weakness, loss of consciousness and weakness.  Endo/Heme/Allergies: Does not bruise/bleed easily.  Psychiatric/Behavioral: Negative for substance abuse. The patient is not nervous/anxious.   All other systems reviewed and are negative.    EKGs/Labs/Other Studies Reviewed:    Studies reviewed were summarized above. The additional studies were reviewed today: As above  EKG:  EKG is ordered today.  The EKG ordered today demonstrates sinus tachycardia, 101 bpm, first-degree AV block,  possible left atrial enlargement, incomplete LBBB, LVH (grossly unchanged from prior  Recent Labs: 04/24/2018: Magnesium 2.3 11/15/2018: TSH 1.530 01/01/2019: ALT 18; BUN 17; Creatinine, Ser 0.99; Hemoglobin 15.2; Platelets 234; Potassium 4.1; Sodium 141  Recent Lipid Panel    Component Value Date/Time   CHOL 218 (H) 11/15/2018 0956   TRIG 120 11/15/2018 0956   HDL 63 11/15/2018 0956   CHOLHDL 3.5 11/15/2018 0956   VLDL 24 11/15/2018 0956   LDLCALC 131 (H) 11/15/2018 0956    PHYSICAL EXAM:    VS:  BP 132/84 (BP Location: Left Arm, Patient Position: Sitting, Cuff Size: Normal)    Pulse (!) 101    Ht 5\' 6"  (1.676 m)    Wt 221 lb (100.2 kg)    BMI 35.67 kg/m   BMI: Body mass index is 35.67 kg/m.  Physical Exam  Constitutional: She is oriented to person, place, and time. She appears well-developed and well-nourished.  HENT:  Head: Normocephalic and atraumatic.  Eyes: Right eye exhibits no discharge. Left eye exhibits no discharge.  Neck: Normal range of motion. No JVD present.  Cardiovascular: Normal rate, regular rhythm, S1 normal and S2 normal. Exam reveals no distant heart sounds, no friction rub, no midsystolic click and no opening snap.  Murmur heard. High-pitched blowing holosystolic murmur is present with a grade of 1/6 at the apex. Pulses:      Posterior tibial pulses are 2+ on the right side and 2+ on the left side.  Heart rate improved to the 90s bpm on exam  Pulmonary/Chest: Effort normal and breath sounds normal. No respiratory distress. She has no decreased breath sounds. She has no wheezes. She has no rales. She exhibits no tenderness.  Abdominal: Soft. She exhibits no distension. There is no abdominal tenderness.  Musculoskeletal:        General: No edema.  Neurological: She is alert and oriented to person, place, and time.  Skin: Skin is warm and dry. No cyanosis. Nails show no clubbing.  Psychiatric: She has a normal mood and affect. Her speech is normal and  behavior is normal. Judgment and thought content normal.    Wt Readings from Last 3 Encounters:  02/07/19 221 lb (100.2 kg)  01/28/19 217 lb (98.4 kg)  01/01/19 217 lb (98.4 kg)     ASSESSMENT & PLAN:   1. Transient visual field disturbance: Concern has been for possible amaurosis fugax.  Carotid artery ultrasound was unrevealing as outlined above.  Patient also concerned symptoms may be related to Zetia though it  appears that she had another episode of blurry vision earlier today with her last dose of Zetia being 1 to 2 weeks prior.  We discussed in detail the cardiac work-up of her symptoms and will proceed with real-time ZIO monitoring x2 for a total of 4 weeks and transthoracic echo initially.  If these studies are unrevealing we will proceed with a TEE.  If that is unremarkable we will refer the patient to EP for consideration of loop recorder implantation.  I also recommend the patient be referred to neurology, which can be coordinated with her PCP.  Recent labs including renal function, potassium, and thyroid function were unrevealing.  Follow-up with ophthalmology as directed.  2. Atrial tachycardia/SVT: Patient's EKG and rhythm strip were reviewed in detail with Dr. Fletcher Anon today and felt to be consistent with sinus tachycardia with first-degree AV block which is unchanged from her prior studies.  There are no definite flutter waves.  Upon reviewing the patient's history from [place] there is mention of atrial tachycardia with the inability to completely exclude atrial flutter.  Without documented atrial flutter we have agreed to continue to defer anticoagulation at this time.  We will not escalate her Cardizem CD as we would not want to mask any potential arrhythmia on cardiac monitoring.  She is asymptomatic nonetheless.  3. Systolic dysfunction secondary to NICM: She appears euvolemic and well compensated.  There is some concern in her last cardiology note this may be exacerbated by poor  imaging with recommendation for Definity on next study.  Follow-up Myoview was negative for significant ischemia.  In follow-up, after the above work-up would recommend she discuss transitioning from calcium channel blocker to beta-blocker with her primary cardiologist.  4. History of TIA: Has previously declined antiplatelet and anticoagulation.  Cardiac monitoring through our office has not demonstrated A. fib or flutter.  Discussed anticoagulation may need to be revisited with her in the future pending the above work-up.  5. Hyperlipidemia: Intolerant to statins and Zetia with patient concerned of Zetia leading to her visual symptoms.  Does not want to revisit antilipid medications at this time.  Most recent LDL of 131 with goal LDL less than 70.  In follow-up, discussion regarding referral to lipid clinic in [place] should be undertaken.  6. Hypertension: Blood pressure is reasonably controlled today given the above circumstances.  Continue current therapy per PCP.  Disposition: F/u with Dr. Rockey Situ or an APP in 6 weeks.   Medication Adjustments/Labs and Tests Ordered: Current medicines are reviewed at length with the patient today.  Concerns regarding medicines are outlined above. Medication changes, Labs and Tests ordered today are summarized above and listed in the Patient Instructions accessible in Encounters.   Signed, Christell Faith, PA-C 02/07/2019 5:05 PM     [address] [place], [place] [address] [phone]

## 2019-02-07 NOTE — Telephone Encounter (Signed)
Noted, will await patient's office visit.

## 2019-02-07 NOTE — Telephone Encounter (Signed)
STAT if patient feels like he/she is going to faint   1) Are you dizzy now? No last episode at 12 today   2) Do you feel faint or have you passed out? No   3) Do you have any other symptoms? Main issue is vision causing some dizzy or hard to find her way to seated position   4) Have you checked your HR and BP (record if available)? No  132/92 HR 120   PATIENT WAS SEEN BY EYE DOCTOR AND HE WANTS PATIENT SEEN ASAP FOR VISUAL CHANGES.

## 2019-02-07 NOTE — Telephone Encounter (Signed)
Spoke with patient and she was in the ER on the 4th and she lost vision in right eye only and then on some visual changes on August 23rd. Last eye doctor visit and he sent message over to Dr. Rockey Situ. She had episode today and pulse at 120 BP 132/92. Eye doctor suspects cardiology source and advised to call our office and he sent message over to Dr. Rockey Situ. Spoke with patient and she is able to come in 3 PM [employer] PA-C to review her symptoms and get EKG due to elevated heart rate. She is not on any anti-coagulation due to previous problems. Will route to provider for upcoming appointment later today.

## 2019-02-08 ENCOUNTER — Ambulatory Visit: Payer: Medicare Other

## 2019-02-12 ENCOUNTER — Ambulatory Visit: Payer: Medicare Other

## 2019-02-15 ENCOUNTER — Ambulatory Visit: Payer: Medicare Other

## 2019-02-15 ENCOUNTER — Ambulatory Visit: Payer: Medicare Other | Admitting: Physician Assistant

## 2019-02-21 ENCOUNTER — Telehealth: Payer: Self-pay | Admitting: Cardiovascular Disease

## 2019-02-21 ENCOUNTER — Telehealth: Payer: Self-pay

## 2019-02-21 NOTE — Telephone Encounter (Signed)
Spoke with zio support to confirm patch not working.  Directed to have patient send back monitor that is not working and receive a new one from Clinic today.    Patient given new monitor.  Lattie Haw aware and will register.  Patient confirmed she will apply new monitor and contact zio for tech support if needed.    Closing encounter.

## 2019-02-21 NOTE — Telephone Encounter (Signed)
°  1. Is this related to a heart monitor you are wearing?  (If the patient says no, please ask     if they are caling about ICD/pacemaker.) yes  2. What is your issue?? Not sure if it is working.  Called tech support and they told patient to come to the office  (If the patient is calling for results of the heart monitor this     message should be sent to nurse.)  PATIENT WAITING IN LOBBY  Please route to covering RN/CMA/RMA for results. Route to monitor technicians or your monitor tech representative for your site for any technical concerns

## 2019-02-21 NOTE — Telephone Encounter (Signed)
Patient came in to the office to pick up an additional Zio (AT) monitor. Patient was instructed to do so by the monitor company  after the first monitor she received had an issue with transmission.  Zio (AT) serial # [id number]

## 2019-02-26 ENCOUNTER — Other Ambulatory Visit: Payer: Self-pay

## 2019-02-26 ENCOUNTER — Ambulatory Visit
Admission: RE | Admit: 2019-02-26 | Discharge: 2019-02-26 | Disposition: A | Discharge status: Home / Self Care | Payer: Medicare Other | Source: Ambulatory Visit | Attending: Internal Medicine | Admitting: Internal Medicine

## 2019-02-26 DIAGNOSIS — M810 Age-related osteoporosis without current pathological fracture: Secondary | ICD-10-CM | POA: Insufficient documentation

## 2019-02-26 DIAGNOSIS — M8588 Other specified disorders of bone density and structure, other site: Secondary | ICD-10-CM | POA: Diagnosis not present

## 2019-02-26 DIAGNOSIS — Z780 Asymptomatic menopausal state: Secondary | ICD-10-CM | POA: Diagnosis not present

## 2019-03-12 ENCOUNTER — Ambulatory Visit (INDEPENDENT_AMBULATORY_CARE_PROVIDER_SITE_OTHER): Payer: Medicare Other

## 2019-03-12 DIAGNOSIS — G453 Amaurosis fugax: Secondary | ICD-10-CM | POA: Diagnosis not present

## 2019-03-12 DIAGNOSIS — I471 Supraventricular tachycardia: Secondary | ICD-10-CM | POA: Diagnosis not present

## 2019-03-14 ENCOUNTER — Other Ambulatory Visit: Payer: Medicare Other

## 2019-03-15 NOTE — Progress Notes (Signed)
Cardiology Office Note  Date:  03/18/2019   ID:  Audrey Martinez, Audrey Martinez [birth year], MRN [id number]  PCP:  Glean Hess, MD   cc: lightheaded   HPI:  Audrey Martinez is a 73 year old woman with history of Chronic sinus tachycardia SVT, atrial tachycardia Ablation x 2 PNA as a child, chronic lung disease, chronic cough/asthma.  Non-smoker  orthostasis Who presents by self referral for history of atrial tachycardia, SVT  Has had symptoms of chronic lightheaded, possibly for several months now resolved over the past week Started drinking more fluids  Right eye vision went dark, 01/02/2019 seen in the ER MRI in the ER, no CVA CT head Encephalomalacia changes in the left frontal region. Left eye: darkness "came down a bit"  Sister with CVA  Has a zio in place, Has worn two (second in place)  zio monitor Normal sinus rhythm avg HR of 89 bpm. 3 Ventricular Tachycardia runs occurred, the run with the fastest interval lasting 5 beats with a max rate of 200 bpm, the longest lasting 7 beats with an avg rate of 141 bpm.   Patient triggered events were not associated with significant arrhythmia.  Carotid: Minor carotid atherosclerosis. No hemodynamically significant ICA stenosis. Degree of narrowing less than 50% bilaterally by ultrasound criteria.   "I dont want a statin" Could not tolerate zetia , does not remember side effect (eye problem)  Problems in the past on b-blockers, gets migraines Causes migraines, asthma  EKG personally reviewed by myself on todays visit Shows sinus tachycardia rate 115 bpm no significant ST or T wave changes  Other past medical history reviewed Echocardiogram May 18, 2018 Ejection fraction 45 to 50% mild diffuse hypokinesis unable to exclude regional wall motion abnormalities Concerning for anteroseptal and apical hypokinesis   Lab Results  Component Value Date   CHOL 218 (H) 11/15/2018   HDL 63 11/15/2018   LDLCALC 131 (H)  11/15/2018   TRIG 120 11/15/2018     Notes from [place] cardiologist Notes indicate 1. Dual AV nodal physiology. 2. Typical slow fast AV nodal reentrant tachycardia. 3. Nonsustained AT / No inducible atrial flutter  She underwent s/p Successful radiofrequency ablation of AV nodal slow pathway.   She had recurrent symptoms shortly after, had event monitor  repeat EPS 01/21/08:  1. Markedly prolonged baseline AH interval (AV nodal function is subnormal)  2. No induceable tachycardia; Only induceable double echo beats As per EP,  event monitor with ectopic atrial tachycardia versus atrial flutter with 2:1 block.   Notes indicating she did not want anticoagulation  PRIOR CARDIAC STUDIES: -ECHOCARDIOGRAM 12/13/06: LVEF 55-60%. No significant valvular abnormalities. Noted to have episodes of atrial flutter at controlled rate. -ETT 09/28/07: Bruce protocol 5:01 minutes. 7 METS. Maximal HR 179 bpm (111% MPHR). Pretest ECG normal with HR 96 bpm. No chest pain. No significant ischemia.  -STRESS ECHO 11/14/08: Bruce protocol 4:31 minutes. 4.6 METS. 113% MPHR. LVEF 60%. No significant ischemia.  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 -Hypertension -Hyperlipidemia -History of TIA -Asthma -History of migraines -Right cataract and history of right retinal bleeding over a year ago, now resolved -S/P umbilical hernia repair -S/P tonsillectomy -S/P multiple wrist surgeries -S/P excision of right supraclavicular neck mass (lymph node) -S/P cataract surgery   PMH:   has a past medical history of Arthritis, Asthma, GERD (gastroesophageal reflux disease), Herniated disc, cervical, Hyperlipidemia, Left wrist fracture, Osteoporosis, Stroke ([place]) (1998), and SVT (supraventricular tachycardia) ([place]).  PSH:    Past Surgical  History:  Procedure Laterality Date  . BACK SURGERY  2017   L2-4 laminectomy and foraminal stenosis  . BREAST CYST ASPIRATION Left 1977  . CARDIAC CATHETERIZATION  2009    [facility] in [place]  . CARDIAC ELECTROPHYSIOLOGY STUDY AND ABLATION  2009  . CATARACT EXTRACTION, BILATERAL  2010  . COLONOSCOPY WITH PROPOFOL N/A 03/21/2018   Procedure: COLONOSCOPY WITH Biopsy;  Surgeon: Lin Landsman, MD;  Location: [facility];  Service: Endoscopy;  Laterality: N/A;  . derrick procedure  1977-78   bilateral   . ESOPHAGOGASTRODUODENOSCOPY  2013   gastritis; done for complaint of dysphagia  . ESOPHAGOGASTRODUODENOSCOPY (EGD) WITH PROPOFOL N/A 03/21/2018   Procedure: ESOPHAGOGASTRODUODENOSCOPY (EGD) WITH Biopsies;  Surgeon: Lin Landsman, MD;  Location: [facility];  Service: Endoscopy;  Laterality: N/A;  KEEP THIS PATIENT FIRST  . lymph node removal     neck  . MOHS SURGERY  07/2016   BCCA nose  . POLYPECTOMY N/A 03/21/2018   Procedure: POLYPECTOMY;  Surgeon: Lin Landsman, MD;  Location: [place];  Service: Endoscopy;  Laterality: N/A;  . REPLACEMENT TOTAL KNEE  2013   RT  . TOTAL SHOULDER REPLACEMENT  2012   RT     Current Outpatient Medications  Medication Sig Dispense Refill  . albuterol (PROVENTIL HFA;VENTOLIN HFA) 108 (90 Base) MCG/ACT inhaler Inhale into the lungs every 6 (six) hours as needed for wheezing or shortness of breath.    . baclofen (LIORESAL) 10 MG tablet Take 1 tablet (10 mg total) by mouth 3 (three) times daily. 270 tablet 1  . Bioflavonoid Products (ESTER-C) TABS Take 1 tablet by mouth daily.    . calcium-vitamin D (OSCAL WITH D) 500-200 MG-UNIT tablet Take 1 tablet by mouth.    . cholecalciferol (VITAMIN D) 400 units TABS tablet Take 400 Units by mouth.    . Cranberry 1000 MG CAPS Take by mouth.    . diltiazem (CARDIZEM CD) 180 MG 24 hr capsule TAKE 1 CAPSULE (180 MG TOTAL) DAILY BY MOUTH. 90 capsule 4  . diltiazem (CARDIZEM) 60 MG tablet Take 60 mg by mouth 4 (four) times daily. Pt takes 1/2 tablet 30 mg PRN; rarely per patient    . fluticasone (FLOVENT HFA) 110 MCG/ACT  inhaler TAKE 2 PUFFS BY MOUTH TWICE A DAY 36 Inhaler 6  . gabapentin (NEURONTIN) 300 MG capsule TAKE 1 CAPSULE (300 MG TOTAL) BY MOUTH 3 (THREE) TIMES DAILY. THREE CAPSULES DAILY AT BEDTIME. 270 capsule 3  . Ginger 500 MG CAPS Take by mouth.    . Misc Natural Products (TART CHERRY ADVANCED PO) Take by mouth.    . montelukast (SINGULAIR) 10 MG tablet Take 10 mg by mouth every evening.    . Multiple Vitamins-Minerals (MULTIVITAMIN WITH MINERALS) tablet Take 1 tablet by mouth daily.    . Olodaterol HCl (STRIVERDI RESPIMAT) 2.5 MCG/ACT AERS Striverdi Respimat 2.5 mcg/actuation solution for inhalation 1 g 6  . Probiotic Product (PROBIOTIC DAILY PO) Take by mouth.    . Turmeric 500 MG CAPS Take by mouth.     No current facility-administered medications for this visit.      Allergies:   Aspirin, Ciprofloxacin, Ciprofloxacin-dexamethasone, Crestor [rosuvastatin], Morphine and related, Amoxicillin, Doxycycline hyclate, Hydrochlorothiazide, Keflex [cephalexin], Lovastatin, Simvastatin, Erythromycin, Ibuprofen, Zetia [ezetimibe], Neomycin-bacitracin zn-polymyx, Omeprazole, and Tape   Social History:  The patient  reports that she has never smoked. She has never used smokeless tobacco. She reports current alcohol use. She reports that she does  not use drugs.   Family History:   family history includes Diabetes in her mother; Stroke in her mother; Valvular heart disease in her father.    Review of Systems: Review of Systems  Constitutional: Negative.   HENT: Negative.   Respiratory: Negative.   Cardiovascular: Negative.   Gastrointestinal: Negative.   Musculoskeletal: Negative.   Neurological: Positive for dizziness.  Psychiatric/Behavioral: Negative.   All other systems reviewed and are negative.    PHYSICAL EXAM: VS:  There were no vitals taken for this visit. , BMI There is no height or weight on file to calculate BMI. GEN: Well nourished, well developed, in no acute distress ,  obese HEENT: normal  Neck: no JVD, carotid bruits, or masses Cardiac: RRR; no murmurs, rubs, or gallops,no edema  Respiratory:  clear to auscultation bilaterally, normal work of breathing GI: soft, nontender, nondistended, + BS MS: no deformity or atrophy  Skin: warm and dry, no rash Neuro:  Strength and sensation are intact Psych: euthymic mood, full affect   Recent Labs: 04/24/2018: Magnesium 2.3 11/15/2018: TSH 1.530 01/01/2019: ALT 18; BUN 17; Creatinine, Ser 0.99; Hemoglobin 15.2; Platelets 234; Potassium 4.1; Sodium 141    Lipid Panel Lab Results  Component Value Date   CHOL 218 (H) 11/15/2018   HDL 63 11/15/2018   LDLCALC 131 (H) 11/15/2018   TRIG 120 11/15/2018      Wt Readings from Last 3 Encounters:  02/07/19 221 lb (100.2 kg)  01/28/19 217 lb (98.4 kg)  01/01/19 217 lb (98.4 kg)       ASSESSMENT AND PLAN:  SVT (supraventricular tachycardia) (HCC) -  Prior ablation denies having any SVT,  Continue diltiazem Baseline sinus tachycardia  Atrial tachycardia (HCC) -  Recent monitor showing short run VT and PVCs She has another Zio in place today For sinus tachycardia could consider corlanor.  Will await to review the results of her monitor  Mixed hyperlipidemia - She does not want a statin Also reports Zetia may have caused her eye problems Discussed with her that is not a usual side effect  Personal history of transient ischemic attack (TIA), and cerebral infarction without residual deficits Does not want aspirin, Plavix or warfarin Monitor to date has not shown arrhythmia such as atrial fibrillation or flutter She does not want a statin or Zetia  Reactive airway disease/asthma On inhalers, Avoid beta-blockers    Disposition:   F/U  12 months Extensive review of records as detailed above, monitors, imaging from recent hospitalizations for TIA stroke  Total encounter time more than 25 minutes  Greater than 50% was spent in counseling and  coordination of care with the patient    No orders of the defined types were placed in this encounter.    Signed, Esmond Plants, M.D., Ph.D. 03/18/2019  [place], [place]

## 2019-03-18 ENCOUNTER — Ambulatory Visit (INDEPENDENT_AMBULATORY_CARE_PROVIDER_SITE_OTHER): Payer: Medicare Other | Admitting: Cardiovascular Disease

## 2019-03-18 ENCOUNTER — Other Ambulatory Visit: Payer: Self-pay

## 2019-03-18 DIAGNOSIS — I428 Other cardiomyopathies: Secondary | ICD-10-CM

## 2019-03-18 DIAGNOSIS — I519 Heart disease, unspecified: Secondary | ICD-10-CM

## 2019-03-18 DIAGNOSIS — E785 Hyperlipidemia, unspecified: Secondary | ICD-10-CM

## 2019-03-18 DIAGNOSIS — I471 Supraventricular tachycardia: Secondary | ICD-10-CM

## 2019-03-18 DIAGNOSIS — I10 Essential (primary) hypertension: Secondary | ICD-10-CM | POA: Diagnosis not present

## 2019-03-18 MED ORDER — DILTIAZEM HCL 30 MG PO TABS: ORAL_TABLET | ORAL | 1 refills | Status: DC

## 2019-03-18 NOTE — Patient Instructions (Addendum)
Stay hydrated  Medication Instructions:  - Your physician has recommended you make the following change in your medication:   1) Diltiazem 30 mg- take 1 tablet by mouth three times a day as needed for fast heart rates   If you need a refill on your cardiac medications before your next appointment, please call your pharmacy.     Lab work: No new labs needed   If you have labs (blood work) drawn today and your tests are completely normal, you will receive your results only by: Marland Kitchen MyChart Message (if you have MyChart) OR . A paper copy in the mail If you have any lab test that is abnormal or we need to change your treatment, we will call you to review the results.   Testing/Procedures: - we will reschedule your echocardiogram to be done after your monitor is done   Follow-Up: At [facility], you and your health needs are our priority.  As part of our continuing mission to provide you with exceptional heart care, we have created designated Provider Care Teams.  These Care Teams include your primary Cardiologist (physician) and Advanced Practice Providers (APPs -  Physician Assistants and Nurse Practitioners) who all work together to provide you with the care you need, when you need it.  . You will need a follow up appointment in 6 months (April 2021)  . Providers on your designated Care Team:   . Murray Hodgkins, NP . Christell Faith, PA-C . Marrianne Mood, PA-C  Any Other Special Instructions Will Be Listed Below (If Applicable).  For educational health videos Log in to : [email or web address] Or : [email or web address], password : [email or web address]

## 2019-03-20 NOTE — Addendum Note (Signed)
Addended by: Anselm Pancoast on: 03/20/2019 03:22 PM   Modules accepted: Orders

## 2019-04-03 DIAGNOSIS — G453 Amaurosis fugax: Secondary | ICD-10-CM | POA: Diagnosis not present

## 2019-04-10 ENCOUNTER — Other Ambulatory Visit: Payer: Self-pay | Admitting: Cardiovascular Disease

## 2019-04-15 ENCOUNTER — Telehealth: Payer: Self-pay

## 2019-04-15 NOTE — Telephone Encounter (Signed)
Call to patient to discuss results from monitor.   Pt verbalized understanding but wanted to make [place] aware that she has since been feeling intermittent chest discomfort. None currently.   Pt also concerned about inc in heartrate with change in positions.  VS  11/13 4:30 AM 185/82, HR 88 sitting 5 AM 134/84 HR 83 sitting 5:30 AM 141/89, HR 84 sitting 11/14 7:28 AM lying 126/81, HR91 sitting 129/90, HR 105 standing 124/95, HR 114 9:06 PM 113/71, HR 85 sitting 11/15 7:38 PM 104/61, 84 sitting  I moved u/s appt to this wed, next available and follow up this Thursday.   Requested patient call back for any new or worsening sx, or return of chest discomfort.

## 2019-04-15 NOTE — Telephone Encounter (Signed)
Recent Myoview without any significant ischemia.  If any symptoms of chest pain return or worsen recommend she proceed to the ED.  Otherwise, she can follow-up later this week. Recommend she maintain adequate hydration.  There will be some variation in heart rate and BP with positional changes.

## 2019-04-15 NOTE — Telephone Encounter (Signed)
-----   Message from Rise Mu, PA-C sent at 04/15/2019  7:19 AM EST ----- Outpatient cardiac monitor showed a predominant rhythm of sinus with an average heart rate of 89 bpm 1 run of NSVT lasting 5 beats Second-degree AV block type I was noted Overall, no significant arrhythmia Await echo scheduled for next week If echo is unrevealing plan to refer to EP for consideration of loop recorder and also consideration for TEE

## 2019-04-17 ENCOUNTER — Other Ambulatory Visit: Payer: Medicare Other

## 2019-04-17 ENCOUNTER — Ambulatory Visit (INDEPENDENT_AMBULATORY_CARE_PROVIDER_SITE_OTHER): Payer: Medicare Other

## 2019-04-17 ENCOUNTER — Other Ambulatory Visit: Payer: Self-pay

## 2019-04-17 DIAGNOSIS — I519 Heart disease, unspecified: Secondary | ICD-10-CM

## 2019-04-18 ENCOUNTER — Encounter: Payer: Self-pay | Admitting: Nurse Practitioner

## 2019-04-18 ENCOUNTER — Ambulatory Visit (INDEPENDENT_AMBULATORY_CARE_PROVIDER_SITE_OTHER): Payer: Medicare Other | Admitting: Nurse Practitioner

## 2019-04-18 ENCOUNTER — Encounter: Payer: Self-pay | Admitting: *Deleted

## 2019-04-18 VITALS — BP 140/80 | HR 116 | Temp 97.0°F | Ht 66.5 in | Wt 225.8 lb

## 2019-04-18 DIAGNOSIS — Z01812 Encounter for preprocedural laboratory examination: Secondary | ICD-10-CM

## 2019-04-18 DIAGNOSIS — I5020 Unspecified systolic (congestive) heart failure: Secondary | ICD-10-CM

## 2019-04-18 DIAGNOSIS — I471 Supraventricular tachycardia, unspecified: Secondary | ICD-10-CM

## 2019-04-18 DIAGNOSIS — I10 Essential (primary) hypertension: Secondary | ICD-10-CM | POA: Diagnosis not present

## 2019-04-18 DIAGNOSIS — I472 Ventricular tachycardia: Secondary | ICD-10-CM | POA: Diagnosis not present

## 2019-04-18 DIAGNOSIS — I519 Heart disease, unspecified: Secondary | ICD-10-CM | POA: Diagnosis not present

## 2019-04-18 DIAGNOSIS — I4729 Other ventricular tachycardia: Secondary | ICD-10-CM

## 2019-04-18 DIAGNOSIS — L570 Actinic keratosis: Secondary | ICD-10-CM | POA: Diagnosis not present

## 2019-04-18 DIAGNOSIS — E782 Mixed hyperlipidemia: Secondary | ICD-10-CM | POA: Diagnosis not present

## 2019-04-18 DIAGNOSIS — I429 Cardiomyopathy, unspecified: Secondary | ICD-10-CM

## 2019-04-18 DIAGNOSIS — I428 Other cardiomyopathies: Secondary | ICD-10-CM | POA: Diagnosis not present

## 2019-04-18 DIAGNOSIS — G453 Amaurosis fugax: Secondary | ICD-10-CM | POA: Diagnosis not present

## 2019-04-18 MED ORDER — CARVEDILOL 3.125 MG PO TABS: 3.1250 mg | ORAL_TABLET | Freq: Two times a day (BID) | ORAL | 6 refills | Status: DC
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
## 2019-04-18 NOTE — Patient Instructions (Signed)
Medication Instructions:  - Your physician has recommended you make the following change in your medication:   1) Stop diltiazem  2) Start coreg (carvedilol) 3.125 mg- take 1 tablet by mouth twice daily  *If you need a refill on your cardiac medications before your next appointment, please call your pharmacy*  Lab Work: - Your physician recommends that you have lab work today: BMP/ CBC  If you have labs (blood work) drawn today and your tests are completely normal, you will receive your results only by: Marland Kitchen MyChart Message (if you have MyChart) OR . A paper copy in the mail If you have any lab test that is abnormal or we need to change your treatment, we will call you to review the results.  Testing/Procedures: - Your physician has requested that you have a cardiac catheterization. Cardiac catheterization is used to diagnose and/or treat various heart conditions. Doctors may recommend this procedure for a number of different reasons. The most common reason is to evaluate chest pain. Chest pain can be a symptom of coronary artery disease (CAD), and cardiac catheterization can show whether plaque is narrowing or blocking your heart's arteries. This procedure is also used to evaluate the valves, as well as measure the blood flow and oxygen levels in different parts of your heart. For further information please visit [email or web address]. Please follow instruction sheet, as given.  - Pre Cath Instructions: You are scheduled for a Cardiac Catheterization on Thursday, December 3 with Dr. Ida Rogue.  1. Please arrive at the [facility], 1st desk on the right to check in at 7:30 AM (This time is one hour before your procedure to ensure your preparation). Free valet parking service is available.   Special note: Every effort is made to have your procedure done on time. Please understand that emergencies sometimes delay scheduled procedures.  2. Diet: Do not eat solid foods after midnight.  You may have clear liquids until 5am on the day of the procedure.  3. Labs:   Pre procedure lab work today: BMP/ CBC  Pre procedure COVID swab: - Monday 04/29/2019 (12:30 pm- 2:30 pm) - [facility] (this is a drive up test only, do not get out of your car, staff will come out to swab you).  4. Medication instructions in preparation for your procedure:  Contrast Allergy: No  You may take all of your regular medications the morning of your procedure with enough water to get them down safely    5. Plan for one night stay--bring personal belongings. 6. Bring a current list of your medications and current insurance cards. 7. You MUST have a responsible person to drive you home. 8. Someone MUST be with you the first 24 hours after you arrive home or your discharge will be delayed. 9. Please wear clothes that are easy to get on and off and wear slip-on shoes.  Thank you for allowing [place] to care for you!   -- [facility]   Follow-Up: At [facility], you and your health needs are our priority.  As part of our continuing mission to provide you with exceptional heart care, we have created designated Provider Care Teams.  These Care Teams include your primary Cardiologist (physician) and Advanced Practice Providers (APPs -  Physician Assistants and Nurse Practitioners) who all work together to provide you with the care you need, when you need it.  Your next appointment:   4 week(s)  The format for your next appointment:  In Person  Provider:    You may see Ida Rogue, MD or one of the following Advanced Practice Providers on your designated Care Team:    Murray Hodgkins, NP  Christell Faith, PA-C  Marrianne Mood, PA-C   Other Instructions - n/a

## 2019-04-19 ENCOUNTER — Telehealth: Payer: Self-pay

## 2019-04-19 LAB — CBC WITH DIFFERENTIAL/PLATELET
Basophils Absolute: 0 10*3/uL (ref 0.0–0.2)
Basos: 0 %
EOS (ABSOLUTE): 0.1 10*3/uL (ref 0.0–0.4)
Eos: 1 %
Hematocrit: 45.8 % (ref 34.0–46.6)
Hemoglobin: 15.7 g/dL (ref 11.1–15.9)
Immature Grans (Abs): 0 10*3/uL (ref 0.0–0.1)
Immature Granulocytes: 0 %
Lymphocytes Absolute: 2 10*3/uL (ref 0.7–3.1)
Lymphs: 26 %
MCH: 30.6 pg (ref 26.6–33.0)
MCHC: 34.3 g/dL (ref 31.5–35.7)
MCV: 89 fL (ref 79–97)
Monocytes Absolute: 0.8 10*3/uL (ref 0.1–0.9)
Monocytes: 10 %
Neutrophils Absolute: 4.8 10*3/uL (ref 1.4–7.0)
Neutrophils: 63 %
Platelets: 267 10*3/uL (ref 150–450)
RBC: 5.13 x10E6/uL (ref 3.77–5.28)
RDW: 12.2 % (ref 11.7–15.4)
WBC: 7.7 10*3/uL (ref 3.4–10.8)

## 2019-04-19 LAB — BASIC METABOLIC PANEL
BUN/Creatinine Ratio: 10 — ABNORMAL LOW (ref 12–28)
BUN: 11 mg/dL (ref 8–27)
CO2: 23 mmol/L (ref 20–29)
Calcium: 10 mg/dL (ref 8.7–10.3)
Chloride: 106 mmol/L (ref 96–106)
Creatinine, Ser: 1.06 mg/dL — ABNORMAL HIGH (ref 0.57–1.00)
GFR calc Af Amer: 61 mL/min/{1.73_m2} (ref >59)
GFR calc non Af Amer: 53 mL/min/{1.73_m2} — ABNORMAL LOW (ref >59)
Glucose: 106 mg/dL — ABNORMAL HIGH (ref 65–99)
Potassium: 4.3 mmol/L (ref 3.5–5.2)
Sodium: 144 mmol/L (ref 134–144)

## 2019-04-19 NOTE — Telephone Encounter (Signed)
-----   Message from Theora Gianotti, NP sent at 04/19/2019 12:39 PM EST ----- Kidneys look very mildly dry - ensure good hydration.  Otw lytes and blood counts look good.  Labs acceptable for cath.

## 2019-04-19 NOTE — Telephone Encounter (Signed)
Call to patient to make her aware of lab results. No further questions or orders at this time.   Advised pt to call for any further questions or concerns.

## 2019-04-24 ENCOUNTER — Other Ambulatory Visit: Payer: Medicare Other

## 2019-04-29 ENCOUNTER — Other Ambulatory Visit: Payer: Self-pay

## 2019-04-29 ENCOUNTER — Other Ambulatory Visit
Admission: RE | Admit: 2019-04-29 | Discharge: 2019-04-29 | Disposition: A | Discharge status: Home / Self Care | Payer: Medicare Other | Source: Ambulatory Visit | Attending: Cardiovascular Disease | Admitting: Cardiovascular Disease

## 2019-04-29 DIAGNOSIS — Z01812 Encounter for preprocedural laboratory examination: Secondary | ICD-10-CM | POA: Diagnosis not present

## 2019-04-29 DIAGNOSIS — Z20828 Contact with and (suspected) exposure to other viral communicable diseases: Secondary | ICD-10-CM | POA: Diagnosis not present

## 2019-04-30 LAB — SARS CORONAVIRUS 2 (TAT 6-24 HRS): SARS Coronavirus 2: NEGATIVE

## 2019-05-02 ENCOUNTER — Ambulatory Visit
Admission: RE | Admit: 2019-05-02 | Discharge: 2019-05-02 | Disposition: A | Discharge status: Home / Self Care | Payer: Medicare Other | Attending: Cardiovascular Disease | Admitting: Cardiovascular Disease

## 2019-05-02 ENCOUNTER — Other Ambulatory Visit: Payer: Self-pay

## 2019-05-02 ENCOUNTER — Encounter
Admission: RE | Disposition: A | Discharge status: Home / Self Care | Payer: Self-pay | Source: Home / Self Care | Attending: Cardiovascular Disease

## 2019-05-02 DIAGNOSIS — K219 Gastro-esophageal reflux disease without esophagitis: Secondary | ICD-10-CM | POA: Insufficient documentation

## 2019-05-02 DIAGNOSIS — I428 Other cardiomyopathies: Secondary | ICD-10-CM | POA: Insufficient documentation

## 2019-05-02 DIAGNOSIS — I471 Supraventricular tachycardia: Secondary | ICD-10-CM | POA: Diagnosis not present

## 2019-05-02 DIAGNOSIS — I119 Hypertensive heart disease without heart failure: Secondary | ICD-10-CM | POA: Insufficient documentation

## 2019-05-02 DIAGNOSIS — I2510 Atherosclerotic heart disease of native coronary artery without angina pectoris: Secondary | ICD-10-CM | POA: Diagnosis not present

## 2019-05-02 DIAGNOSIS — E785 Hyperlipidemia, unspecified: Secondary | ICD-10-CM | POA: Insufficient documentation

## 2019-05-02 DIAGNOSIS — Z8673 Personal history of transient ischemic attack (TIA), and cerebral infarction without residual deficits: Secondary | ICD-10-CM | POA: Insufficient documentation

## 2019-05-02 DIAGNOSIS — I429 Cardiomyopathy, unspecified: Secondary | ICD-10-CM

## 2019-05-02 DIAGNOSIS — I420 Dilated cardiomyopathy: Secondary | ICD-10-CM | POA: Diagnosis not present

## 2019-05-02 DIAGNOSIS — J45909 Unspecified asthma, uncomplicated: Secondary | ICD-10-CM | POA: Insufficient documentation

## 2019-05-02 DIAGNOSIS — Z79899 Other long term (current) drug therapy: Secondary | ICD-10-CM | POA: Diagnosis not present

## 2019-05-02 DIAGNOSIS — I519 Heart disease, unspecified: Secondary | ICD-10-CM

## 2019-05-02 HISTORY — PX: LEFT HEART CATH AND CORONARY ANGIOGRAPHY: CATH118249

## 2019-05-02 SURGERY — LEFT HEART CATH AND CORONARY ANGIOGRAPHY
Anesthesia: Moderate Sedation | Laterality: Left

## 2019-05-02 MED ORDER — FENTANYL CITRATE (PF) 100 MCG/2ML IJ SOLN
INTRAMUSCULAR | Status: AC
  Filled 2019-05-02: qty 2

## 2019-05-02 MED ORDER — HEPARIN (PORCINE) IN NACL 1000-0.9 UT/500ML-% IV SOLN
INTRAVENOUS | Status: AC
  Filled 2019-05-02: qty 1000

## 2019-05-02 MED ORDER — SODIUM CHLORIDE 0.9 % IV SOLN: 250.0000 mL | INTRAVENOUS | Status: DC | PRN

## 2019-05-02 MED ORDER — MIDAZOLAM HCL 2 MG/2ML IJ SOLN
INTRAMUSCULAR | Status: AC
  Filled 2019-05-02: qty 2

## 2019-05-02 MED ORDER — HEPARIN (PORCINE) IN NACL 1000-0.9 UT/500ML-% IV SOLN
INTRAVENOUS | Status: DC | PRN
  Administered 2019-05-02: 500 mL

## 2019-05-02 MED ORDER — MIDAZOLAM HCL 2 MG/2ML IJ SOLN
INTRAMUSCULAR | Status: DC | PRN
  Administered 2019-05-02: 2 mg via INTRAVENOUS

## 2019-05-02 MED ORDER — IOHEXOL 300 MG/ML  SOLN
INTRAMUSCULAR | Status: DC | PRN
  Administered 2019-05-02: 100 mL

## 2019-05-02 MED ORDER — SODIUM CHLORIDE 0.9% FLUSH: 3.0000 mL | INTRAVENOUS | Status: DC | PRN

## 2019-05-02 MED ORDER — SODIUM CHLORIDE 0.9 % IV SOLN
INTRAVENOUS | Status: DC
  Administered 2019-05-02: 09:00:00 via INTRAVENOUS

## 2019-05-02 MED ORDER — SODIUM CHLORIDE 0.9% FLUSH
3.0000 mL | Freq: Two times a day (BID) | INTRAVENOUS | Status: DC
  Administered 2019-05-02: 3 mL via INTRAVENOUS

## 2019-05-02 SURGICAL SUPPLY — 10 items
CATH INFINITI 5FR ANG PIGTAIL (CATHETERS) ×3 IMPLANT
CATH INFINITI 5FR JL4 (CATHETERS) ×3 IMPLANT
CATH INFINITI JR4 5F (CATHETERS) ×3 IMPLANT
DEVICE CLOSURE MYNXGRIP 5F (Vascular Products) ×3 IMPLANT
KIT MANI 3VAL PERCEP (MISCELLANEOUS) ×3 IMPLANT
NEEDLE PERC 18GX7CM (NEEDLE) ×3 IMPLANT
NEEDLE SMART REG 18GX2-3/4 (NEEDLE) ×3 IMPLANT
PACK CARDIAC CATH (CUSTOM PROCEDURE TRAY) ×3 IMPLANT
SHEATH AVANTI 5FR X 11CM (SHEATH) ×3 IMPLANT
WIRE GUIDERIGHT .035X150 (WIRE) ×3 IMPLANT

## 2019-05-02 NOTE — Discharge Instructions (Signed)
Coronary Angiogram A coronary angiogram is an X-ray procedure that is used to examine the arteries in the heart. In this procedure, a dye (contrast dye) is injected through a long, thin tube (catheter). The catheter is inserted through the groin, wrist, or arm. The dye is injected into each artery, then X-rays are taken to show if there is a blockage in the arteries of the heart. This procedure can also show if you have valve disease or a disease of the aorta, and it can be used to check the overall function of your heart muscle. You may have a coronary angiogram if:  You are having chest pain, or other symptoms of angina, and you are at risk for heart disease.  You have an abnormal electrocardiogram (ECG) or stress test.  You have chest pain and heart failure.  You are having irregular heart rhythms.  You and your health care provider determine that the benefits of the test information outweigh the risks of the procedure. Let your health care provider know about:  Any allergies you have, including allergies to contrast dye.  All medicines you are taking, including vitamins, herbs, eye drops, creams, and over-the-counter medicines.  Any problems you or family members have had with anesthetic medicines.  Any blood disorders you have.  Any surgeries you have had.  History of kidney problems or kidney failure.  Any medical conditions you have.  Whether you are pregnant or may be pregnant. What are the risks? Generally, this is a safe procedure. However, problems may occur, including:  Infection.  Allergic reaction to medicines or dyes that are used.  Bleeding from the access site or other locations.  Kidney injury, especially in people with impaired kidney function.  Stroke (rare).  Heart attack (rare).  Damage to other structures or organs. What happens before the procedure? Staying hydrated Follow instructions from your health care provider about hydration, which may  include:  Up to 2 hours before the procedure - you may continue to drink clear liquids, such as water, clear fruit juice, black coffee, and plain tea. Eating and drinking restrictions Follow instructions from your health care provider about eating and drinking, which may include:  8 hours before the procedure - stop eating heavy meals or foods such as meat, fried foods, or fatty foods.  6 hours before the procedure - stop eating light meals or foods, such as toast or cereal.  2 hours before the procedure - stop drinking clear liquids. General instructions  Ask your health care provider about: ? Changing or stopping your regular medicines. This is especially important if you are taking diabetes medicines or blood thinners. ? Taking medicines such as ibuprofen. These medicines can thin your blood. Do not take these medicines before your procedure if your health care provider instructs you not to, though aspirin may be recommended prior to coronary angiograms.  Plan to have someone take you home from the hospital or clinic.  You may need to have blood tests or X-rays done. What happens during the procedure?  An IV tube will be inserted into one of your veins.  You will be given one or more of the following: ? A medicine to help you relax (sedative). ? A medicine to numb the area where the catheter will be inserted into an artery (local anesthetic).  To reduce your risk of infection: ? Your health care team will wash or sanitize their hands. ? Your skin will be washed with soap. ? Hair may be removed  from the area where the catheter will be inserted.  You will be connected to a continuous ECG monitor.  The catheter will be inserted into an artery. The location may be in your groin, in your wrist, or in the fold of your arm (near your elbow).  A type of X-ray (fluoroscopy) will be used to help guide the catheter to the opening of the blood vessel that is being examined.  A dye will  be injected into the catheter, and X-rays will be taken. The dye will help to show where any narrowing or blockages are located in the heart arteries.  Tell your health care provider if you have any chest pain or trouble breathing during the procedure.  If blockages are found, your health care provider may perform another procedure, such as inserting a coronary stent. The procedure may vary among health care providers and hospitals. What happens after the procedure?  After the procedure, you will need to keep the area still for a few hours, or for as long as told by your health care provider. If the procedure is done through the groin, you will be instructed to not bend and not cross your legs.  The insertion site will be checked frequently.  The pulse in your foot or wrist will be checked frequently.  You may have additional blood tests, X-rays, and a test that records the electrical activity of your heart (ECG).  Do not drive for 24 hours if you were given a sedative. Summary  A coronary angiogram is an X-ray procedure that is used to look into the arteries in the heart.  During the procedure, a dye (contrast dye) is injected through a long, thin tube (catheter). The catheter is inserted through the groin, wrist, or arm.  Tell your health care provider about any allergies you have, including allergies to contrast dye.  After the procedure, you will need to keep the area still for a few hours, or for as long as told by your health care provider. This information is not intended to replace advice given to you by your health care provider. Make sure you discuss any questions you have with your health care provider. Document Released: 11/20/2002 Document Revised: 04/28/2017 Document Reviewed: 02/26/2016 Elsevier Patient Education  [place]. Moderate Conscious Sedation, Adult, Care After These instructions provide you with information about caring for yourself after your  procedure. Your health care provider may also give you more specific instructions. Your treatment has been planned according to current medical practices, but problems sometimes occur. Call your health care provider if you have any problems or questions after your procedure. What can I expect after the procedure? After your procedure, it is common:  To feel sleepy for several hours.  To feel clumsy and have poor balance for several hours.  To have poor judgment for several hours.  To vomit if you eat too soon. Follow these instructions at home: For at least 24 hours after the procedure:   Do not: ? Participate in activities where you could fall or become injured. ? Drive. ? Use heavy machinery. ? Drink alcohol. ? Take sleeping pills or medicines that cause drowsiness. ? Make important decisions or sign legal documents. ? Take care of children on your own.  Rest. Eating and drinking  Follow the diet recommended by your health care provider.  If you vomit: ? Drink water, juice, or soup when you can drink without vomiting. ? Make sure you have little or no nausea  before eating solid foods. General instructions  Have a responsible adult stay with you until you are awake and alert.  Take over-the-counter and prescription medicines only as told by your health care provider.  If you smoke, do not smoke without supervision.  Keep all follow-up visits as told by your health care provider. This is important. Contact a health care provider if:  You keep feeling nauseous or you keep vomiting.  You feel light-headed.  You develop a rash.  You have a fever. Get help right away if:  You have trouble breathing. This information is not intended to replace advice given to you by your health care provider. Make sure you discuss any questions you have with your health care provider. Document Released: 03/06/2013 Document Revised: 04/28/2017 Document Reviewed: 09/05/2015 Elsevier  Patient Education  2020 [place]. Femoral Site Care This sheet gives you information about how to care for yourself after your procedure. Your health care provider may also give you more specific instructions. If you have problems or questions, contact your health care provider. What can I expect after the procedure? After the procedure, it is common to have:  Bruising that usually fades within 1-2 weeks.  Tenderness at the site. Follow these instructions at home: Wound care  Follow instructions from your health care provider about how to take care of your insertion site. Make sure you: ? Wash your hands with soap and water before you change your bandage (dressing). If soap and water are not available, use hand sanitizer. ? Change your dressing as told by your health care provider. ? Leave stitches (sutures), skin glue, or adhesive strips in place. These skin closures may need to stay in place for 2 weeks or longer. If adhesive strip edges start to loosen and curl up, you may trim the loose edges. Do not remove adhesive strips completely unless your health care provider tells you to do that.  Do not take baths, swim, or use a hot tub until your health care provider approves.  You may shower 24-48 hours after the procedure or as told by your health care provider. ? Gently wash the site with plain soap and water. ? Pat the area dry with a clean towel. ? Do not rub the site. This may cause bleeding.  Do not apply powder or lotion to the site. Keep the site clean and dry.  Check your femoral site every day for signs of infection. Check for: ? Redness, swelling, or pain. ? Fluid or blood. ? Warmth. ? Pus or a bad smell. Activity  For the first 2-3 days after your procedure, or as long as directed: ? Avoid climbing stairs as much as possible. ? Do not squat.  Do not lift anything that is heavier than 10 lb (4.5 kg), or the limit that you are told, until your health care provider  says that it is safe.  Rest as directed. ? Avoid sitting for a long time without moving. Get up to take short walks every 1-2 hours.  Do not drive for 24 hours if you were given a medicine to help you relax (sedative). General instructions  Take over-the-counter and prescription medicines only as told by your health care provider.  Keep all follow-up visits as told by your health care provider. This is important. Contact a health care provider if you have:  A fever or chills.  You have redness, swelling, or pain around your insertion site. Get help right away if:  The catheter insertion  area swells very fast.  You pass out.  You suddenly start to sweat or your skin gets clammy.  The catheter insertion area is bleeding, and the bleeding does not stop when you hold steady pressure on the area.  The area near or just beyond the catheter insertion site becomes pale, cool, tingly, or numb. These symptoms may represent a serious problem that is an emergency. Do not wait to see if the symptoms will go away. Get medical help right away. Call your local emergency services (911 in the [place] Do not drive yourself to the hospital. Summary  After the procedure, it is common to have bruising that usually fades within 1-2 weeks.  Check your femoral site every day for signs of infection.  Do not lift anything that is heavier than 10 lb (4.5 kg), or the limit that you are told, until your health care provider says that it is safe. This information is not intended to replace advice given to you by your health care provider. Make sure you discuss any questions you have with your health care provider. Document Released: 01/17/2014 Document Revised: 05/29/2017 Document Reviewed: 05/29/2017 Elsevier Patient Education  2020 [employer].

## 2019-05-03 ENCOUNTER — Encounter: Payer: Self-pay | Admitting: Cardiovascular Disease

## 2019-05-05 NOTE — H&P (Signed)
H&P Addendum, precardiac catheterization  Patient was seen and evaluated prior to Cardiac catheterization procedure Symptoms, prior testing details again confirmed with the patient Patient examined, no significant change from prior exam Lab work reviewed in detail personally by myself Patient understands risk and benefit of the procedure, willing to proceed  Signed, Tim Gollan, MD, Ph.D [facility]    

## 2019-05-16 ENCOUNTER — Ambulatory Visit: Payer: Medicare Other | Admitting: Nurse Practitioner

## 2019-05-21 ENCOUNTER — Other Ambulatory Visit: Payer: Self-pay

## 2019-05-21 ENCOUNTER — Ambulatory Visit: Payer: Medicare Other | Admitting: Internal Medicine

## 2019-06-04 ENCOUNTER — Ambulatory Visit
Admission: RE | Admit: 2019-06-04 | Discharge: 2019-06-04 | Disposition: A | Discharge status: Home / Self Care | Payer: Medicare Other | Source: Ambulatory Visit | Attending: Internal Medicine | Admitting: Internal Medicine

## 2019-06-04 ENCOUNTER — Emergency Department
Admission: EM | Admit: 2019-06-04 | Discharge: 2019-06-04 | Disposition: A | Discharge status: Home / Self Care | Payer: Medicare Other | Attending: Emergency Medicine | Admitting: Emergency Medicine

## 2019-06-04 ENCOUNTER — Encounter: Payer: Self-pay | Admitting: Emergency Medicine

## 2019-06-04 DIAGNOSIS — Z1231 Encounter for screening mammogram for malignant neoplasm of breast: Secondary | ICD-10-CM | POA: Insufficient documentation

## 2019-06-04 DIAGNOSIS — Z79899 Other long term (current) drug therapy: Secondary | ICD-10-CM | POA: Diagnosis not present

## 2019-06-04 DIAGNOSIS — R079 Chest pain, unspecified: Secondary | ICD-10-CM | POA: Diagnosis not present

## 2019-06-04 DIAGNOSIS — Z8673 Personal history of transient ischemic attack (TIA), and cerebral infarction without residual deficits: Secondary | ICD-10-CM | POA: Insufficient documentation

## 2019-06-04 DIAGNOSIS — Z96651 Presence of right artificial knee joint: Secondary | ICD-10-CM | POA: Insufficient documentation

## 2019-06-04 DIAGNOSIS — I10 Essential (primary) hypertension: Secondary | ICD-10-CM | POA: Insufficient documentation

## 2019-06-04 DIAGNOSIS — R0789 Other chest pain: Secondary | ICD-10-CM | POA: Diagnosis not present

## 2019-06-04 LAB — CBC
HCT: 43.9 % (ref 36.0–46.0)
Hemoglobin: 14.8 g/dL (ref 12.0–15.0)
MCH: 30.9 pg (ref 26.0–34.0)
MCHC: 33.7 g/dL (ref 30.0–36.0)
MCV: 91.6 fL (ref 80.0–100.0)
Platelets: 206 10*3/uL (ref 150–400)
RBC: 4.79 MIL/uL (ref 3.87–5.11)
RDW: 11.7 % (ref 11.5–15.5)
WBC: 7.5 10*3/uL (ref 4.0–10.5)
nRBC: 0 % (ref 0.0–0.2)

## 2019-06-04 LAB — BASIC METABOLIC PANEL
Anion gap: 7 (ref 5–15)
BUN: 15 mg/dL (ref 8–23)
CO2: 28 mmol/L (ref 22–32)
Calcium: 9.1 mg/dL (ref 8.9–10.3)
Chloride: 106 mmol/L (ref 98–111)
Creatinine, Ser: 1.02 mg/dL — ABNORMAL HIGH (ref 0.44–1.00)
GFR calc Af Amer: >60 mL/min (ref >60)
GFR calc non Af Amer: 55 mL/min — ABNORMAL LOW (ref >60)
Glucose, Bld: 100 mg/dL — ABNORMAL HIGH (ref 70–99)
Potassium: 3.6 mmol/L (ref 3.5–5.1)
Sodium: 141 mmol/L (ref 135–145)

## 2019-06-04 LAB — TROPONIN I (HIGH SENSITIVITY)
Troponin I (High Sensitivity): 3 ng/L (ref <18)
Troponin I (High Sensitivity): 3 ng/L (ref <18)

## 2019-06-04 NOTE — ED Triage Notes (Signed)
Pt reports she started to have "funny feeling" in her face yesterday and today and started to check her BP. Pt had heart cath 2 weeks ago with no blockages found. Pt took 30mg  Diltiazem @ 2220 last night. Last reading was 157/101 @ 0015.

## 2019-06-04 NOTE — ED Provider Notes (Signed)
[facility] Emergency Department Provider Note  Time seen: 7:23 AM  I have reviewed the triage vital signs and the nursing notes.   HISTORY  Chief Complaint Hypertension   HPI Era Smaldone is a 74 y.o. female with a past medical history of Martinez, Audrey Martinez, Audrey Martinez, Audrey Martinez, Audrey Martinez, Audrey status post ablation presents to the emergency department for a "funny feeling."  According to the patient last night she was having some discomfort in her right jaw, states she was lying down using her tablet and did not know if it was muscular or could be related to her heart.  Patient has had a fairly extensive recent cardiac work-up including Holter monitor and cardiac catheterization 4 weeks ago which per patient have resulted reassuring.  Patient states she took her blood pressure and it had elevated from 120s to 158 which concerned her so she came to the emergency department.  Patient denies any discomfort currently.  No shortness of breath.  No lower extremity edema or discomfort.  Past Medical History:  Diagnosis Date  . Amaurosis fugax, right eye 12/2018  . Martinez   . Audrey Martinez   . Audrey Martinez ([place])    a. 04/2018 Echo: EF 45-50%. Anteroseptal and apical HK in some views. Mild AI/MR. Nl RV fxn; b. 05/2018 MV: mid-dist ant and antsept mild defect - ? breast atten. EF 41%. No ischemia; c. 03/2019 Echo: EF 30-35%, Gr2 DD, Trace MR, triv TR. Asc Ao ectatic dil - 63mm.   Marland Kitchen GERD (gastroesophageal Martinez disease)   . Herniated disc, cervical   . Audrey Martinez   . Left wrist fracture   . Osteoporosis   . Stroke ([place]) 1998  . Audrey (supraventricular tachycardia) ([place])    a.2008 s/p RFCA for AVNRT; b. 9 & 02/2019 Zio x 2: 1. Avg HR 89, 3 runs NSVT (longest 7 beats), Mobitz 1. 2. Avg HR 89, NSVT x 1 (5 beats), Mobitz 1. No signif arrhythmia.    Patient Active Problem List   Diagnosis Date Noted  . Dilated Audrey Martinez ([place]) 05/02/2019  .  Amaurosis fugax of right eye 01/13/2019  . Colon cancer screening   . Arm pain, right 11/08/2017  . Renal insufficiency 11/08/2017  . Tendinitis of right knee 07/03/2017  . Lumbar radiculopathy 05/18/2017  . Status post hemilaminotomy 05/18/2017  . Lumbar degenerative disc disease 05/18/2017  . Spinal stenosis of lumbar region without neurogenic claudication 05/18/2017  . Atrial tachycardia ([place]) 04/14/2017  . Audrey (supraventricular tachycardia) ([place]) 04/14/2017  . Essential hypertension 04/14/2017  . Intermittent Audrey Martinez without complication [id number]  . Osteoporosis 03/21/2017  . Benign essential tremor 03/21/2017  . Obesity 09/14/2011  . Audrey Martinez 05/12/2003  . Personal history of transient ischemic attack (TIA), and cerebral infarction without residual deficits 04/22/2003    Past Surgical History:  Procedure Laterality Date  . BACK SURGERY  2017   L2-4 laminectomy and foraminal stenosis  . BREAST CYST ASPIRATION Left 1977  . CARDIAC CATHETERIZATION  2009   [facility] in [place]  . CARDIAC ELECTROPHYSIOLOGY STUDY AND ABLATION  2009  . CATARACT EXTRACTION, BILATERAL  2010  . COLONOSCOPY WITH PROPOFOL N/A 03/21/2018   Procedure: COLONOSCOPY WITH Biopsy;  Surgeon: Lin Landsman, MD;  Location: [place];  Service: Endoscopy;  Laterality: N/A;  . derrick procedure  1977-78   bilateral   . ESOPHAGOGASTRODUODENOSCOPY  2013   gastritis; done for complaint of dysphagia  . ESOPHAGOGASTRODUODENOSCOPY (EGD) WITH PROPOFOL N/A 03/21/2018   Procedure: ESOPHAGOGASTRODUODENOSCOPY (EGD)  WITH Biopsies;  Surgeon: Lin Landsman, MD;  Location: [place];  Service: Endoscopy;  Laterality: N/A;  KEEP THIS PATIENT FIRST  . LEFT HEART CATH AND CORONARY ANGIOGRAPHY Left 05/02/2019   Procedure: LEFT HEART CATH AND CORONARY ANGIOGRAPHY;  Surgeon: Minna Merritts, MD;  Location: [place] [facility];  Service: Cardiovascular;  Laterality: Left;   . lymph node removal     neck  . MOHS SURGERY  07/2016   BCCA nose  . POLYPECTOMY N/A 03/21/2018   Procedure: POLYPECTOMY;  Surgeon: Lin Landsman, MD;  Location: [place];  Service: Endoscopy;  Laterality: N/A;  . REPLACEMENT TOTAL KNEE  2013   RT  . TOTAL SHOULDER REPLACEMENT  2012   RT     Prior to Admission medications   Medication Sig Start Date End Date Taking? Authorizing Provider  albuterol (PROVENTIL HFA;VENTOLIN HFA) 108 (90 Base) MCG/ACT inhaler Inhale 1-2 puffs into the lungs every 6 (six) hours as needed for wheezing or shortness of breath.     [provider]  B Complex-C (B-COMPLEX WITH VITAMIN C) tablet Take 1 tablet by mouth daily.    [provider]  baclofen (LIORESAL) 10 MG tablet Take 1 tablet (10 mg total) by mouth 3 (three) times daily. Patient taking differently: Take 10 mg by mouth 2 (two) times daily.  01/28/19   Glean Hess, MD  Bioflavonoid Products (ESTER-C) TABS Take 1 tablet by mouth 2 (two) times daily.     [provider]  Calcium Carb-Cholecalciferol (CALCIUM 1000 + D PO) Take 1 tablet by mouth daily.    [provider]  carvedilol (COREG) 3.125 MG tablet Take 1 tablet (3.125 mg total) by mouth 2 (two) times daily with a meal. 04/18/19   Theora Gianotti, NP  cholecalciferol (VITAMIN D) 25 MCG (1000 UT) tablet Take 1,000 Units by mouth daily.    [provider]  Cranberry 1000 MG CAPS Take 1,000 mg by mouth daily.     [provider]  diltiazem (CARDIZEM) 30 MG tablet Take 1 tablet (30 mg) by mouth three times a day as needed for fast heart rates Patient not taking: Reported on 04/19/2019 03/18/19   Minna Merritts, MD  fluticasone (FLOVENT HFA) 110 MCG/ACT inhaler TAKE 2 PUFFS BY MOUTH TWICE A DAY Patient taking differently: Inhale 2 puffs into the lungs 2 (two) times daily.  01/22/19   Flora Lipps, MD  gabapentin (NEURONTIN) 300 MG capsule TAKE 1 CAPSULE (300 MG  TOTAL) BY MOUTH 3 (THREE) TIMES DAILY. THREE CAPSULES DAILY AT BEDTIME. Patient taking differently: Take 300 mg by mouth 3 (three) times daily as needed (nerve pain.).  01/23/19   Glean Hess, MD  Ginger, Zingiber officinalis, (GINGER PO) Take 1,100 mg by mouth daily.    [provider]  MAG ASPART-POTASSIUM ASPART PO Take 1 tablet by mouth 2 (two) times daily. 600 mg-198 mg    [provider]  Misc Natural Products (TART CHERRY ADVANCED PO) Take 1 capsule by mouth daily.     [provider]  montelukast (SINGULAIR) 10 MG tablet Take 10 mg by mouth every evening. 10/25/18   [provider]  Multiple Vitamins-Minerals (MULTIVITAMIN WITH MINERALS) tablet Take 1 tablet by mouth daily. Centrum Silver    [provider]  Olodaterol HCl (STRIVERDI RESPIMAT) 2.5 MCG/ACT AERS Striverdi Respimat 2.5 mcg/actuation solution for inhalation Patient taking differently: Inhale 2 puffs into the lungs daily.  01/22/19   Flora Lipps,  MD  Probiotic Product (PROBIOTIC DAILY PO) Take 1 capsule by mouth 2 (two) times a week.     [provider]  Turmeric 500 MG CAPS Take 500 mg by mouth daily.     [provider]    Allergies  Allergen Reactions  . Amoxicillin Hives, Shortness Of Breath and Swelling    Did it involve swelling of the face/tongue/throat, SOB, or low BP? Yes Did it involve sudden or severe rash/hives, skin peeling, or any reaction on the inside of your mouth or nose? Yes Did you need to seek medical attention at a hospital or doctor's office? Yes When did it last happen?1998 If all above answers are "NO", may proceed with cephalosporin use.   . Aspirin Anaphylaxis  . Ciprofloxacin Rash  . Ciprofloxacin-Dexamethasone Anaphylaxis  . Crestor [Rosuvastatin] Other (See Comments)    Caused problems with memory.  . Morphine And Related Nausea And Vomiting  . Doxycycline Hyclate Hives  . Hydrochlorothiazide Other (See Comments)     Raises her blood pressure.   Marland Kitchen Keflex [Cephalexin] Hives  . Lovastatin Other (See Comments)    "Unable to function"  . Simvastatin Other (See Comments)    "Unable to function"  . Erythromycin Hives  . Ibuprofen Other (See Comments)    Sharp stomach pains  . Zetia [Ezetimibe] Other (See Comments)    Blurry eyes, felt funny, memory problems.   . Neomycin-Bacitracin Zn-Polymyx Hives and Rash  . Omeprazole Palpitations    Tachycardia and same sensation as her SVTs per patient  . Tape Hives and Rash    Family History  Problem Relation Age of Onset  . Diabetes Mother   . Stroke Mother   . Valvular heart disease Father   . Breast cancer Neg Hx     Social History Social History   Tobacco Use  . Smoking status: Never Smoker  . Smokeless tobacco: Never Used  Substance Use Topics  . Alcohol use: Not Currently    Comment: may drink 1-2x/yr  . Drug use: No    Review of Systems Constitutional: Negative for fever. Cardiovascular: Negative for chest pain. Respiratory: Negative for shortness of breath. Gastrointestinal: Negative for abdominal pain Musculoskeletal: Negative for musculoskeletal complaints Neurological: Negative for headache All other ROS negative  ____________________________________________   PHYSICAL EXAM:  VITAL SIGNS: ED Triage Vitals  Enc Vitals Group     BP 06/04/19 0122 (!) 159/85     Pulse Rate 06/04/19 0122 78     Resp 06/04/19 0122 17     Temp 06/04/19 0122 98 F (36.7 C)     Temp Source 06/04/19 0122 Oral     SpO2 06/04/19 0122 97 %     Weight --      Height --      Head Circumference --      Peak Flow --      Pain Score 06/04/19 0518 0     Pain Loc --      Pain Edu? --      Excl. in [place]? --    Constitutional: Alert and oriented. Well appearing and in no distress. Eyes: Normal exam ENT      Head: Normocephalic and atraumatic      Mouth/Throat: Mucous membranes are moist. Cardiovascular: Normal rate, regular rhythm.  Respiratory:  Normal respiratory effort without tachypnea nor retractions. Breath sounds are clear Gastrointestinal: Soft and nontender. No distention. Musculoskeletal: Nontender with normal range of motion in all extremities.  Neurologic:  Normal speech and  language. No gross focal neurologic deficits  Skin:  Skin is warm, dry and intact.  Psychiatric: Mood and affect are normal.   ____________________________________________    EKG  EKG viewed and interpreted by myself shows a normal sinus rhythm at 73 bpm with a narrow QRS, normal axis, normal intervals, nonspecific ST changes without ST elevation.  ____________________________________________  INITIAL IMPRESSION / ASSESSMENT AND PLAN / ED COURSE  Pertinent labs & imaging results that were available during my care of the patient were reviewed by me and considered in my medical decision making (see chart for details).   Patient presents to the emergency department for discomfort in the right jaw last night which has since resolved as well as elevated blood pressure.  Overall the patient appears well, no distress, normal physical exam denies any symptoms at this time.  Patient's work-up has resulted reassuring as well, lab work is normal including cardiac enzymes x2, EKG is reassuring.  Patient is already following up with Dr. Rockey Situ of cardiology.  Given the patient's reassuring work-up reassuring vitals and physical exam I believe the patient is safe for discharge home with cardiology follow-up as scheduled.  I provided my typical chest pain return precautions.  Maika Shaunya Hassler was evaluated in Emergency Department on 06/04/2019 for the symptoms described in the history of present illness. She was evaluated in the context of the global COVID-19 pandemic, which necessitated consideration that the patient might be at risk for infection with the SARS-CoV-2 virus that causes COVID-19. Institutional protocols and algorithms that pertain to the evaluation of  patients at risk for COVID-19 are in a state of rapid change based on information released by regulatory bodies including the CDC and federal and state organizations. These policies and algorithms were followed during the patient's care in the ED.  ____________________________________________   FINAL CLINICAL IMPRESSION(S) / ED DIAGNOSES  Hypertension Chest pain   Harvest Dark, MD 06/04/19 [phone]

## 2019-06-04 NOTE — ED Notes (Signed)
Pt sitting in lobby with no distress noted; updated on wait time & vs retaken

## 2019-06-04 NOTE — ED Notes (Signed)
Pt sitting in lobby with no distress noted; updated on wait time; taken to triage for repeat troponin and vs

## 2019-06-07 ENCOUNTER — Encounter: Payer: Self-pay | Admitting: Cardiovascular Disease

## 2019-06-07 NOTE — Progress Notes (Signed)
Cardiology Office Note  Date:  06/10/2019   ID:  Audrey Martinez, Audrey Martinez 11/02/1945, MRN [id number]  PCP:  Glean Hess, MD   Chief Complaint  Patient presents with  . office visit    Pt ED f/u post cath. Meds verbally reviewed w/ pt.     HPI:  Audrey Martinez is a 74 year old woman with history of Chronic sinus tachycardia SVT, atrial tachycardia Ablation x 2 PNA as a child, chronic lung disease, chronic cough/asthma.  Non-smoker  orthostasis Previous migraines Who presents for f/u of her atrial tachycardia, SVT, EF 30 to 35%, possible hypertensive cardiomyopathy, medication intolerances  Echocardiogram April 17, 2019 with ejection fraction 30 to 35%, normal RV function  Cardiac catheterization performed for cardiomyopathy  Catheterization December 2020 Nonobstructive CAD, EF estimated at 30 to 35%, global hypokinesis,  LVEDP 17 to 20 mm Hg  episode of tachycardia, went to the ER 06/03/2018 Rhythm broke before she could get an EKG in the emergency room Rare episodes, does not remember the last time she had an episode   EKG personally reviewed by myself on todays visit Normal sinus rhythm rate 95 bpm no significant ST-T wave changes  Other past medical history reviewed  Right eye vision went dark, 01/02/2019 seen in the ER MRI in the ER, no CVA CT head Encephalomalacia changes in the left frontal region. Left eye: darkness "came down a bit"  zio monitor,  Normal sinus rhythm  avg HR of 89 bpm. 3 Ventricular Tachycardia runs occurred, the run with the fastest interval lasting 5 beats with a max rate of 200 bpm, the longest lasting 7 beats with an avg rate of 141 bpm.  Patient triggered events were not associated with significant arrhythmia.  Carotid: Minor carotid atherosclerosis. No hemodynamically significant ICA stenosis. Degree of narrowing less than 50% bilaterally by ultrasound criteria.   Echocardiogram May 18, 2018 Ejection fraction 45 to 50% mild  diffuse hypokinesis unable to exclude regional wall motion abnormalities Concerning for anteroseptal and apical hypokinesis   Lab Results  Component Value Date   CHOL 218 (H) 11/15/2018   HDL 63 11/15/2018   LDLCALC 131 (H) 11/15/2018   TRIG 120 11/15/2018     Notes from [place] cardiologist Notes indicate 1. Dual AV nodal physiology. 2. Typical slow fast AV nodal reentrant tachycardia. 3. Nonsustained AT / No inducible atrial flutter  She underwent s/p Successful radiofrequency ablation of AV nodal slow pathway.   She had recurrent symptoms shortly after, had event monitor  repeat EPS 01/21/08:  1. Markedly prolonged baseline AH interval (AV nodal function is subnormal)  2. No induceable tachycardia; Only induceable double echo beats As per EP,  event monitor with ectopic atrial tachycardia versus atrial flutter with 2:1 block.   Notes indicating she did not want anticoagulation  PRIOR CARDIAC STUDIES: -ECHOCARDIOGRAM 12/13/06: LVEF 55-60%. No significant valvular abnormalities. Noted to have episodes of atrial flutter at controlled rate. -ETT 09/28/07: Bruce protocol 5:01 minutes. 7 METS. Maximal HR 179 bpm (111% MPHR). Pretest ECG normal with HR 96 bpm. No chest pain. No significant ischemia.  -STRESS ECHO 11/14/08: Bruce protocol 4:31 minutes. 4.6 METS. 113% MPHR. LVEF 60%. No significant ischemia.  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 -Hypertension -Hyperlipidemia -History of TIA -Asthma -History of migraines -Right cataract and history of right retinal bleeding over a year ago, now resolved -S/P umbilical hernia repair -S/P tonsillectomy -S/P multiple wrist surgeries -S/P excision of right supraclavicular neck mass (lymph node) -S/P cataract surgery  PMH:   has a past medical history of Amaurosis fugax, right eye (12/2018), Arthritis, Asthma, Cardiomyopathy ([place]), GERD (gastroesophageal reflux disease), Herniated disc, cervical, Hyperlipidemia, Left wrist  fracture, Osteoporosis, Stroke ([place]) (1998), and SVT (supraventricular tachycardia) ([place]).  PSH:    Past Surgical History:  Procedure Laterality Date  . BACK SURGERY  2017   L2-4 laminectomy and foraminal stenosis  . BREAST CYST ASPIRATION Left 1977  . CARDIAC CATHETERIZATION  2009   [facility] in [place]  . CARDIAC ELECTROPHYSIOLOGY STUDY AND ABLATION  2009  . CATARACT EXTRACTION, BILATERAL  2010  . COLONOSCOPY WITH PROPOFOL N/A 03/21/2018   Procedure: COLONOSCOPY WITH Biopsy;  Surgeon: Lin Landsman, MD;  Location: [facility];  Service: Endoscopy;  Laterality: N/A;  . derrick procedure  1977-78   bilateral   . ESOPHAGOGASTRODUODENOSCOPY  2013   gastritis; done for complaint of dysphagia  . ESOPHAGOGASTRODUODENOSCOPY (EGD) WITH PROPOFOL N/A 03/21/2018   Procedure: ESOPHAGOGASTRODUODENOSCOPY (EGD) WITH Biopsies;  Surgeon: Lin Landsman, MD;  Location: [facility];  Service: Endoscopy;  Laterality: N/A;  KEEP THIS PATIENT FIRST  . LEFT HEART CATH AND CORONARY ANGIOGRAPHY Left 05/02/2019   Procedure: LEFT HEART CATH AND CORONARY ANGIOGRAPHY;  Surgeon: Minna Merritts, MD;  Location: [facility];  Service: Cardiovascular;  Laterality: Left;  . lymph node removal     neck  . MOHS SURGERY  07/2016   BCCA nose  . POLYPECTOMY N/A 03/21/2018   Procedure: POLYPECTOMY;  Surgeon: Lin Landsman, MD;  Location: [facility];  Service: Endoscopy;  Laterality: N/A;  . REPLACEMENT TOTAL KNEE  2013   RT  . TOTAL SHOULDER REPLACEMENT  2012   RT     Current Outpatient Medications  Medication Sig Dispense Refill  . albuterol (PROVENTIL HFA;VENTOLIN HFA) 108 (90 Base) MCG/ACT inhaler Inhale 1-2 puffs into the lungs every 6 (six) hours as needed for wheezing or shortness of breath.     . B Complex-C (B-COMPLEX WITH VITAMIN C) tablet Take 1 tablet by mouth daily.    . baclofen (LIORESAL) 10 MG tablet Take 1 tablet (10 mg total)  by mouth 3 (three) times daily. (Patient taking differently: Take 10 mg by mouth 2 (two) times daily. ) 270 tablet 1  . Bioflavonoid Products (ESTER-C) TABS Take 1 tablet by mouth 2 (two) times daily.     . Calcium Carb-Cholecalciferol (CALCIUM 1000 + D PO) Take 1 tablet by mouth daily.    . carvedilol (COREG) 3.125 MG tablet Take 1 tablet (3.125 mg total) by mouth 2 (two) times daily with a meal. 60 tablet 6  . cholecalciferol (VITAMIN D) 25 MCG (1000 UT) tablet Take 1,000 Units by mouth daily.    . Cranberry 1000 MG CAPS Take 1,000 mg by mouth daily.     Marland Kitchen diltiazem (CARDIZEM) 30 MG tablet Take 1 tablet (30 mg) by mouth three times a day as needed for fast heart rates 90 tablet 1  . fluticasone (FLOVENT HFA) 110 MCG/ACT inhaler TAKE 2 PUFFS BY MOUTH TWICE A DAY (Patient taking differently: Inhale 2 puffs into the lungs 2 (two) times daily. ) 36 Inhaler 6  . gabapentin (NEURONTIN) 300 MG capsule TAKE 1 CAPSULE (300 MG TOTAL) BY MOUTH 3 (THREE) TIMES DAILY. THREE CAPSULES DAILY AT BEDTIME. (Patient taking differently: Take 300 mg by mouth 3 (three) times daily as needed (nerve pain.). ) 270 capsule 3  . Ginger, Zingiber officinalis, (GINGER PO) Take 1,100 mg by mouth daily.    Marland Kitchen  MAG ASPART-POTASSIUM ASPART PO Take 1 tablet by mouth 2 (two) times daily. 600 mg-198 mg    . Misc Natural Products (TART CHERRY ADVANCED PO) Take 1 capsule by mouth daily.     . montelukast (SINGULAIR) 10 MG tablet Take 10 mg by mouth every evening.    . Multiple Vitamins-Minerals (MULTIVITAMIN WITH MINERALS) tablet Take 1 tablet by mouth daily. Centrum Silver    . Olodaterol HCl (STRIVERDI RESPIMAT) 2.5 MCG/ACT AERS Striverdi Respimat 2.5 mcg/actuation solution for inhalation (Patient taking differently: Inhale 2 puffs into the lungs daily. ) 1 g 6  . Probiotic Product (PROBIOTIC DAILY PO) Take 1 capsule by mouth 2 (two) times a week.     . Turmeric 500 MG CAPS Take 500 mg by mouth daily.      No current  facility-administered medications for this visit.     Allergies:   Amoxicillin, Aspirin, Ciprofloxacin, Ciprofloxacin-dexamethasone, Crestor [rosuvastatin], Morphine and related, Doxycycline hyclate, Hydrochlorothiazide, Keflex [cephalexin], Lovastatin, Simvastatin, Erythromycin, Ibuprofen, Zetia [ezetimibe], Neomycin-bacitracin zn-polymyx, Omeprazole, and Tape   Social History:  The patient  reports that she has never smoked. She has never used smokeless tobacco. She reports previous alcohol use. She reports that she does not use drugs.   Family History:   family history includes Diabetes in her mother; Stroke in her mother; Valvular heart disease in her father.    Review of Systems: Review of Systems  Constitutional: Negative.   HENT: Negative.   Respiratory: Negative.   Cardiovascular: Negative.        Tachycardia episode June 04, 2019  Gastrointestinal: Negative.   Musculoskeletal: Negative.   Psychiatric/Behavioral: Negative.   All other systems reviewed and are negative.   PHYSICAL EXAM: VS:  BP 110/88 (BP Location: Left Arm, Patient Position: Sitting, Cuff Size: Normal)   Pulse 95   Ht 5' 6.5" (1.689 m)   Wt 223 lb 8 oz (101.4 kg)   SpO2 98%   BMI 35.53 kg/m  , BMI Body mass index is 35.53 kg/m. Constitutional:  oriented to person, place, and time. No distress.  HENT:  Head: Grossly normal Eyes:  no discharge. No scleral icterus.  Neck: No JVD, no carotid bruits  Cardiovascular: Regular rate and rhythm, no murmurs appreciated Pulmonary/Chest: Clear to auscultation bilaterally, no wheezes or rails Abdominal: Soft.  no distension.  no tenderness.  Musculoskeletal: Normal range of motion Neurological:  normal muscle tone. Coordination normal. No atrophy Skin: Skin warm and dry Psychiatric: normal affect, pleasant  Recent Labs: 11/15/2018: TSH 1.530 01/01/2019: ALT 18 06/04/2019: BUN 15; Creatinine, Ser 1.02; Hemoglobin 14.8; Platelets 206; Potassium 3.6; Sodium 141     Lipid Panel Lab Results  Component Value Date   CHOL 218 (H) 11/15/2018   HDL 63 11/15/2018   LDLCALC 131 (H) 11/15/2018   TRIG 120 11/15/2018      Wt Readings from Last 3 Encounters:  06/10/19 223 lb 8 oz (101.4 kg)  05/02/19 230 lb (104.3 kg)  04/18/19 225 lb 12 oz (102.4 kg)       ASSESSMENT AND PLAN:  SVT (supraventricular tachycardia) (HCC) -  Prior ablation  Recent episode tachycardia, etiology unclear  see below  Cardiomyopathy, dilated Continue carvedilol, start losartan 12.5 mg daily Nonischemic based on cardiac catheterization  Atrial tachycardia (HCC) -  Recent monitor showing short run VT and PVCs Recent episode likely atrial tachycardia resolved before she got to the emergency room June 04, 2019 Recommend she take extra carvedilol, even a diltiazem for any breakthrough tachycardia Having  very rare episodes  Mixed hyperlipidemia - She does not want a statin Also reports Zetia may have caused her eye problems Numbers are elevated total cholesterol 218  Personal history of transient ischemic attack (TIA), and cerebral infarction without residual deficits Does not want aspirin, Plavix or warfarin No documentation of atrial fibrillation or flutter  Reactive airway disease/asthma On inhalers, Tolerating carvedilol No migraines   Total encounter time more than 25 minutes  Greater than 50% was spent in counseling and coordination of care with the patient  Follow-up 6 months  No orders of the defined types were placed in this encounter.    Signed, Esmond Plants, M.D., Ph.D. 06/10/2019  [place], [place]

## 2019-06-10 ENCOUNTER — Ambulatory Visit (INDEPENDENT_AMBULATORY_CARE_PROVIDER_SITE_OTHER): Payer: Medicare Other | Admitting: Cardiovascular Disease

## 2019-06-10 ENCOUNTER — Encounter: Payer: Self-pay | Admitting: Cardiovascular Disease

## 2019-06-10 ENCOUNTER — Other Ambulatory Visit: Payer: Self-pay

## 2019-06-10 VITALS — BP 110/88 | HR 95 | Ht 66.5 in | Wt 223.5 lb

## 2019-06-10 DIAGNOSIS — I472 Ventricular tachycardia: Secondary | ICD-10-CM | POA: Diagnosis not present

## 2019-06-10 DIAGNOSIS — I471 Supraventricular tachycardia: Secondary | ICD-10-CM | POA: Diagnosis not present

## 2019-06-10 DIAGNOSIS — I519 Heart disease, unspecified: Secondary | ICD-10-CM | POA: Diagnosis not present

## 2019-06-10 DIAGNOSIS — I428 Other cardiomyopathies: Secondary | ICD-10-CM

## 2019-06-10 DIAGNOSIS — I10 Essential (primary) hypertension: Secondary | ICD-10-CM | POA: Diagnosis not present

## 2019-06-10 DIAGNOSIS — I4729 Other ventricular tachycardia: Secondary | ICD-10-CM

## 2019-06-10 DIAGNOSIS — I5020 Unspecified systolic (congestive) heart failure: Secondary | ICD-10-CM | POA: Diagnosis not present

## 2019-06-10 MED ORDER — LOSARTAN POTASSIUM 25 MG PO TABS: 12.5000 mg | ORAL_TABLET | Freq: Every day | ORAL | 3 refills | Status: DC

## 2019-06-10 NOTE — Patient Instructions (Addendum)
Read about scotoma   Medication Instructions:  Please start losartan 12.5 mg daily (1/2 of the 25 mg )  For tachycardia, Take extra coreg 3.125 or diltiazem 30 mg pill If rhythm does not go back to normal after one hour,  take another coreg or diltiazem   If you need a refill on your cardiac medications before your next appointment, please call your pharmacy.    Lab work: No new labs needed   If you have labs (blood work) drawn today and your tests are completely normal, you will receive your results only by: Marland Kitchen MyChart Message (if you have MyChart) OR . A paper copy in the mail If you have any lab test that is abnormal or we need to change your treatment, we will call you to review the results.   Testing/Procedures: No new testing needed   Follow-Up: At [facility], you and your health needs are our priority.  As part of our continuing mission to provide you with exceptional heart care, we have created designated Provider Care Teams.  These Care Teams include your primary Cardiologist (physician) and Advanced Practice Providers (APPs -  Physician Assistants and Nurse Practitioners) who all work together to provide you with the care you need, when you need it.  . You will need a follow up appointment in 6 months   . Providers on your designated Care Team:   . Murray Hodgkins, NP . Christell Faith, PA-C . Marrianne Mood, PA-C  Any Other Special Instructions Will Be Listed Below (If Applicable).  For educational health videos Log in to : [email or web address] Or : [email or web address], password : [email or web address]

## 2019-08-12 ENCOUNTER — Other Ambulatory Visit: Payer: Self-pay

## 2019-08-12 ENCOUNTER — Encounter: Payer: Self-pay | Admitting: Dermatology

## 2019-08-12 ENCOUNTER — Ambulatory Visit (INDEPENDENT_AMBULATORY_CARE_PROVIDER_SITE_OTHER): Payer: Medicare Other | Admitting: Dermatology

## 2019-08-12 DIAGNOSIS — Z85828 Personal history of other malignant neoplasm of skin: Secondary | ICD-10-CM | POA: Diagnosis not present

## 2019-08-12 DIAGNOSIS — L578 Other skin changes due to chronic exposure to nonionizing radiation: Secondary | ICD-10-CM | POA: Diagnosis not present

## 2019-08-12 DIAGNOSIS — L738 Other specified follicular disorders: Secondary | ICD-10-CM | POA: Diagnosis not present

## 2019-08-12 DIAGNOSIS — L821 Other seborrheic keratosis: Secondary | ICD-10-CM | POA: Diagnosis not present

## 2019-08-12 DIAGNOSIS — L820 Inflamed seborrheic keratosis: Secondary | ICD-10-CM | POA: Diagnosis not present

## 2019-08-12 DIAGNOSIS — L570 Actinic keratosis: Secondary | ICD-10-CM | POA: Diagnosis not present

## 2019-08-12 NOTE — Progress Notes (Signed)
   Follow-Up Visit   Subjective  Audrey Martinez is a 74 y.o. female who presents for the following: spot on face (scaly, peels, picks at).  The following portions of the chart were reviewed this encounter and updated as appropriate:     Review of Systems: No other skin or systemic complaints.  Objective  Well appearing patient in no apparent distress; mood and affect are within normal limits.  A focused examination was performed including head, including the scalp, face, neck, nose, ears, eyelids, and lips and chest and arms. Relevant physical exam findings are noted in the Assessment and Plan.  Objective  Right Malar Cheek x 1, Right forehead x 1 (2): Erythematous thin papules/macules with gritty scale.   Objective  Face, chest, arms: Diffuse scaly erythematous macules with underlying dyspigmentation.   Objective  Central Forehead: Well healed scar with no evidence of recurrence.   Objective  Right jaw x 3, Left Jaw x 1, R cheek x 1 (5): Erythematous keratotic or waxy stuck-on papules.   Objective  Forehead, nose: Small yellow papules with a central dell.   Objective  Face: Stuck-on, waxy, tan-brown papules and plaques -- Discussed benign etiology and prognosis.    Assessment & Plan  AK (actinic keratosis) (2) Right Malar Cheek x 1, Right forehead x 1  Destruction of lesion - Right Malar Cheek x 1, Right forehead x 1  Destruction method: cryotherapy   Informed consent: discussed and consent obtained   Lesion destroyed using liquid nitrogen: Yes   Post-procedure details: wound care instructions given    Actinic skin damage Face, chest, arms  SPF 30 or greater Broad-spectrum sunscreen.  History of basal cell carcinoma (BCC) Central Forehead  Observe for recurrence.  Inflamed seborrheic keratosis (5) Right jaw x 3, Left Jaw x 1, R cheek x 1  Destruction of lesion - Right jaw x 3, Left Jaw x 1, R cheek x 1  Destruction method: cryotherapy    Informed consent: discussed and consent obtained   Lesion destroyed using liquid nitrogen: Yes   Outcome: patient tolerated procedure well with no complications   Post-procedure details: wound care instructions given    Sebaceous hyperplasia Forehead, nose  Benign, observe.  Seborrheic keratosis Face  Benign, observe.

## 2019-08-23 ENCOUNTER — Other Ambulatory Visit: Payer: Self-pay

## 2019-08-23 MED ORDER — CARVEDILOL 3.125 MG PO TABS: 3.1250 mg | ORAL_TABLET | Freq: Two times a day (BID) | ORAL | 5 refills | Status: DC

## 2019-09-16 ENCOUNTER — Other Ambulatory Visit: Payer: Self-pay | Admitting: Internal Medicine

## 2019-09-16 DIAGNOSIS — J4520 Mild intermittent asthma, uncomplicated: Secondary | ICD-10-CM

## 2019-09-20 ENCOUNTER — Telehealth: Payer: Self-pay | Admitting: Internal Medicine

## 2019-09-20 MED ORDER — SALMETEROL XINAFOATE 50 MCG/DOSE IN AEPB: 1.0000 | INHALATION_SPRAY | Freq: Two times a day (BID) | RESPIRATORY_TRACT | 3 refills | Status: DC

## 2019-09-20 NOTE — Telephone Encounter (Signed)
New 90 day supply sent to pharmacy. Nothing further needed at this time

## 2019-10-01 ENCOUNTER — Other Ambulatory Visit: Payer: Self-pay | Admitting: Internal Medicine

## 2019-10-01 ENCOUNTER — Telehealth: Payer: Self-pay | Admitting: Internal Medicine

## 2019-10-01 DIAGNOSIS — J4520 Mild intermittent asthma, uncomplicated: Secondary | ICD-10-CM

## 2019-10-01 MED ORDER — STRIVERDI RESPIMAT 2.5 MCG/ACT IN AERS: 2.0000 | INHALATION_SPRAY | Freq: Every day | RESPIRATORY_TRACT | 2 refills | Status: DC

## 2019-10-01 NOTE — Telephone Encounter (Signed)
ATC patient left voicemail letting her know that RX has been sent to [facility] pharmacy. Nothing further needed at this time.

## 2019-10-01 NOTE — Telephone Encounter (Signed)
Requested medication (s) are due for refill today: yes  Requested medication (s) are on the active medication list: yes  Last refill:  01/28/19  Future visit scheduled: yes  Notes to clinic:  med not delegated to NT to RF   Requested Prescriptions  Pending Prescriptions Disp Refills   baclofen (LIORESAL) 10 MG tablet [Pharmacy Med Name: BACLOFEN 10 MG TABLET] 270 tablet 1    Sig: TAKE 1 TABLET BY MOUTH THREE TIMES A DAY      Not Delegated - Analgesics:  Muscle Relaxants Failed - 10/01/2019  9:17 AM      Failed - This refill cannot be delegated      Failed - Valid encounter within last 6 months    Recent Outpatient Visits           10 months ago Essential hypertension   [facility] Glean Hess, MD   1 year ago Acute non-recurrent maxillary sinusitis   [facility] Glean Hess, MD   1 year ago Essential hypertension   [facility] Glean Hess, MD   1 year ago Annual physical exam   [facility] Glean Hess, MD   2 years ago Acute pain of right knee   [facility] Glean Hess, MD       Future Appointments             In 1 month [place] Melia Jesse Sans, MD [facility], [place]   In 2 months [place], Kathlene November, MD [facility], [place]

## 2019-10-08 ENCOUNTER — Ambulatory Visit (INDEPENDENT_AMBULATORY_CARE_PROVIDER_SITE_OTHER): Payer: Medicare Other | Admitting: Internal Medicine

## 2019-10-08 ENCOUNTER — Other Ambulatory Visit: Payer: Self-pay | Admitting: Internal Medicine

## 2019-10-08 ENCOUNTER — Encounter: Payer: Self-pay | Admitting: Internal Medicine

## 2019-10-08 ENCOUNTER — Other Ambulatory Visit: Payer: Self-pay

## 2019-10-08 VITALS — BP 124/84 | HR 90 | Temp 98.1°F | Ht 66.5 in | Wt 222.0 lb

## 2019-10-08 DIAGNOSIS — I10 Essential (primary) hypertension: Secondary | ICD-10-CM

## 2019-10-08 DIAGNOSIS — J449 Chronic obstructive pulmonary disease, unspecified: Secondary | ICD-10-CM | POA: Insufficient documentation

## 2019-10-08 DIAGNOSIS — M79651 Pain in right thigh: Secondary | ICD-10-CM

## 2019-10-08 NOTE — Progress Notes (Signed)
Date:  10/08/2019   Name:  Audrey Martinez   DOB:  Feb 24, 1946   MRN:  [id number]   Chief Complaint: Leg Pain (Right inside of thigh pain- close to panty line. Sore. She cannot explain the pain besides saying it "just hurts."  No swelling or bruises.)  Leg Pain  There was no injury mechanism. The pain is present in the right thigh. The quality of the pain is described as shooting. The pain is mild. The pain has been fluctuating since onset. Pertinent negatives include no inability to bear weight, loss of sensation, muscle weakness, numbness or tingling. The symptoms are aggravated by palpation (noticed when her 20 lb dog pushed on the area with her foot). She has tried nothing for the symptoms.    Lab Results  Component Value Date   CREATININE 1.02 (H) 06/04/2019   BUN 15 06/04/2019   NA 141 06/04/2019   K 3.6 06/04/2019   CL 106 06/04/2019   CO2 28 06/04/2019   Lab Results  Component Value Date   CHOL 218 (H) 11/15/2018   HDL 63 11/15/2018   LDLCALC 131 (H) 11/15/2018   TRIG 120 11/15/2018   CHOLHDL 3.5 11/15/2018   Lab Results  Component Value Date   TSH 1.530 11/15/2018   No results found for: HGBA1C Lab Results  Component Value Date   WBC 7.5 06/04/2019   HGB 14.8 06/04/2019   HCT 43.9 06/04/2019   MCV 91.6 06/04/2019   PLT 206 06/04/2019   Lab Results  Component Value Date   ALT 18 01/01/2019   AST 23 01/01/2019   ALKPHOS 85 01/01/2019   BILITOT 0.7 01/01/2019     Review of Systems  Constitutional: Negative for chills, fatigue and fever.  Respiratory: Negative for chest tightness and shortness of breath.   Cardiovascular: Positive for palpitations. Negative for chest pain and leg swelling.  Musculoskeletal: Positive for arthralgias (right shoulder). Negative for gait problem.  Neurological: Negative for dizziness, tingling and numbness.    Patient Active Problem List   Diagnosis Date Noted  . Dilated cardiomyopathy ([place]) 05/02/2019  . Amaurosis  fugax of right eye 01/13/2019  . Colon cancer screening   . Arm pain, right 11/08/2017  . Renal insufficiency 11/08/2017  . Tendinitis of right knee 07/03/2017  . Lumbar radiculopathy 05/18/2017  . Status post hemilaminotomy 05/18/2017  . Lumbar degenerative disc disease 05/18/2017  . Spinal stenosis of lumbar region without neurogenic claudication 05/18/2017  . Atrial tachycardia ([place]) 04/14/2017  . SVT (supraventricular tachycardia) ([place]) 04/14/2017  . Essential hypertension 04/14/2017  . Intermittent asthma without complication 99991111  . Osteoporosis 03/21/2017  . Benign essential tremor 03/21/2017  . Obesity 09/14/2011  . Hyperlipidemia 05/12/2003  . Personal history of transient ischemic attack (TIA), and cerebral infarction without residual deficits 04/22/2003    Allergies  Allergen Reactions  . Amoxicillin Hives, Shortness Of Breath and Swelling    Did it involve swelling of the face/tongue/throat, SOB, or low BP? Yes Did it involve sudden or severe rash/hives, skin peeling, or any reaction on the inside of your mouth or nose? Yes Did you need to seek medical attention at a hospital or doctor's office? Yes When did it last happen?1998 If all above answers are "NO", may proceed with cephalosporin use.   . Aspirin Anaphylaxis  . Ciprofloxacin Rash  . Ciprofloxacin-Dexamethasone Anaphylaxis  . Crestor [Rosuvastatin] Other (See Comments)    Caused problems with memory.  . Morphine And Related Nausea And Vomiting  .  Doxycycline Hyclate Hives  . Hydrochlorothiazide Other (See Comments)    Raises her blood pressure.   Marland Kitchen Keflex [Cephalexin] Hives  . Lovastatin Other (See Comments)    "Unable to function"  . Simvastatin Other (See Comments)    "Unable to function"  . Erythromycin Hives  . Ibuprofen Other (See Comments)    Sharp stomach pains  . Zetia [Ezetimibe] Other (See Comments)    Blurry eyes, felt funny, memory problems.   . Neomycin-Bacitracin Zn-Polymyx  Hives and Rash  . Omeprazole Palpitations    Tachycardia and same sensation as her SVTs per patient  . Tape Hives and Rash    Past Surgical History:  Procedure Laterality Date  . BACK SURGERY  2017   L2-4 laminectomy and foraminal stenosis  . BREAST CYST ASPIRATION Left 1977  . CARDIAC CATHETERIZATION  2009   [facility] in [place]  . CARDIAC ELECTROPHYSIOLOGY STUDY AND ABLATION  2009  . CATARACT EXTRACTION, BILATERAL  2010  . COLONOSCOPY WITH PROPOFOL N/A 03/21/2018   Procedure: COLONOSCOPY WITH Biopsy;  Surgeon: Lin Landsman, MD;  Location: [facility];  Service: Endoscopy;  Laterality: N/A;  . derrick procedure  1977-78   bilateral   . ESOPHAGOGASTRODUODENOSCOPY  2013   gastritis; done for complaint of dysphagia  . ESOPHAGOGASTRODUODENOSCOPY (EGD) WITH PROPOFOL N/A 03/21/2018   Procedure: ESOPHAGOGASTRODUODENOSCOPY (EGD) WITH Biopsies;  Surgeon: Lin Landsman, MD;  Location: [facility];  Service: Endoscopy;  Laterality: N/A;  KEEP THIS PATIENT FIRST  . LEFT HEART CATH AND CORONARY ANGIOGRAPHY Left 05/02/2019   Procedure: LEFT HEART CATH AND CORONARY ANGIOGRAPHY;  Surgeon: Minna Merritts, MD;  Location: [facility];  Service: Cardiovascular;  Laterality: Left;  . lymph node removal     neck  . MOHS SURGERY  07/2016   BCCA nose  . POLYPECTOMY N/A 03/21/2018   Procedure: POLYPECTOMY;  Surgeon: Lin Landsman, MD;  Location: [facility];  Service: Endoscopy;  Laterality: N/A;  . REPLACEMENT TOTAL KNEE  2013   RT  . TOTAL SHOULDER REPLACEMENT  2012   RT     Social History   Tobacco Use  . Smoking status: Never Smoker  . Smokeless tobacco: Never Used  Substance Use Topics  . Alcohol use: Not Currently    Comment: may drink 1-2x/yr  . Drug use: No     Medication list has been reviewed and updated.  No outpatient medications have been marked as taking for the 10/08/19 encounter (Office Visit) with  Glean Hess, MD.    [facility] 2/9 Scores 10/08/2019 01/28/2019 11/15/2018 11/08/2017  PHQ - 2 Score 0 0 0 0  PHQ- 9 Score 0 - - -    BP Readings from Last 3 Encounters:  10/08/19 124/84  06/10/19 110/88  06/04/19 (!) 159/90    Physical Exam Vitals and nursing note reviewed.  Constitutional:      General: She is not in acute distress.    Appearance: Normal appearance. She is well-developed.  HENT:     Head: Normocephalic and atraumatic.  Cardiovascular:     Rate and Rhythm: Normal rate and regular rhythm.  Pulmonary:     Effort: Pulmonary effort is normal. No respiratory distress.     Breath sounds: No wheezing or rhonchi.  Abdominal:     Palpations: Abdomen is soft.     Tenderness: There is no abdominal tenderness.  Musculoskeletal:     Cervical back: Normal range of motion and neck supple.  Right hip: Normal. No tenderness or bony tenderness. Normal range of motion.     Left hip: Normal. No tenderness or bony tenderness. Normal range of motion.     Right lower leg: No edema.     Left lower leg: No edema.       Legs:     Comments: Tender to palpation - no mass, redness, warmth, bruising.  No evidence of bone pain.  No varicose veins.  Skin:    General: Skin is warm and dry.     Findings: No rash.  Neurological:     Mental Status: She is alert and oriented to person, place, and time.  Psychiatric:        Behavior: Behavior normal.        Thought Content: Thought content normal.     Wt Readings from Last 3 Encounters:  10/08/19 222 lb (100.7 kg)  06/10/19 223 lb 8 oz (101.4 kg)  05/02/19 230 lb (104.3 kg)    BP 124/84   Pulse 90   Temp 98.1 F (36.7 C) (Temporal)   Ht 5' 6.5" (1.689 m)   Wt 222 lb (100.7 kg)   SpO2 95%   BMI 35.29 kg/m   Assessment and Plan: 1. Pain of right thigh No obvious cause of pain - DVT, infection, varicose veins doubtful Suspect it is a subcutaneous cyst that was aggravated by pressure from the dog Avoid further compression -  follow up if worsening  2. Chronic obstructive pulmonary disease, unspecified COPD type ([place]) Stable, followed by Pulmonary Dr. Maretta Bees. On Serevent, singlair, flovent and albuterol  3. Essential hypertension Clinically stable exam with well controlled BP on coreg and losartan. Tolerating medications without side effects at this time. Pt to continue current regimen and low sodium diet; benefits of regular exercise as able discussed.     Partially dictated using Editor, commissioning. Any errors are unintentional.  Halina Maidens, MD [facility]  10/08/2019

## 2019-11-18 ENCOUNTER — Encounter: Payer: Medicare Other | Admitting: Internal Medicine

## 2019-11-24 ENCOUNTER — Other Ambulatory Visit: Payer: Self-pay | Admitting: Internal Medicine

## 2019-11-24 DIAGNOSIS — E782 Mixed hyperlipidemia: Secondary | ICD-10-CM

## 2019-11-24 NOTE — Telephone Encounter (Signed)
Requested medication (s) are due for refill today: --  Requested medication (s) are on the active medication list: historical med  Last refill:  10/08/19  Future visit scheduled: yes  Notes to clinic:  Historical provider   Requested Prescriptions  Pending Prescriptions Disp Refills   ezetimibe (ZETIA) 10 MG tablet [Pharmacy Med Name: EZETIMIBE 10 MG TABLET] 90 tablet 3    Sig: TAKE 1 TABLET BY MOUTH EVERY DAY      Cardiovascular:  Antilipid - Sterol Transport Inhibitors Failed - 11/24/2019  9:30 AM      Failed - Total Cholesterol in normal range and within 360 days    Cholesterol  Date Value Ref Range Status  11/15/2018 218 (H) 0 - 200 mg/dL Final          Failed - LDL in normal range and within 360 days    LDL Cholesterol  Date Value Ref Range Status  11/15/2018 131 (H) 0 - 99 mg/dL Final    Comment:           Total Cholesterol/HDL:CHD Risk Coronary Heart Disease Risk Table                     Men   Women  1/2 Average Risk   3.4   3.3  Average Risk       5.0   4.4  2 X Average Risk   9.6   7.1  3 X Average Risk  23.4   11.0        Use the calculated Patient Ratio above and the CHD Risk Table to determine the patient's CHD Risk.        ATP III CLASSIFICATION (LDL):  <100     mg/dL   Optimal  100-129  mg/dL   Near or Above                    Optimal  130-159  mg/dL   Borderline  160-189  mg/dL   High  >190     mg/dL   Very High Performed at [facility], [address] [place], [place] [address]           Failed - HDL in normal range and within 360 days    HDL  Date Value Ref Range Status  11/15/2018 63 >40 mg/dL Final          Failed - Triglycerides in normal range and within 360 days    Triglycerides  Date Value Ref Range Status  11/15/2018 120 <150 mg/dL Final          Passed - Valid encounter within last 12 months    Recent Outpatient Visits           1 month ago Pain of right thigh   [facility] Glean Hess, MD    1 year ago Essential hypertension   [place], Laura H, MD   1 year ago Acute non-recurrent maxillary sinusitis   [facility] Glean Hess, MD   1 year ago Essential hypertension   [facility] Glean Hess, MD   2 years ago Annual physical exam   [facility] Glean Hess, MD       Future Appointments             Tomorrow Glean Hess, MD [facility], [place]   In 2 weeks Gollan, Kathlene November, MD [facility], [place]

## 2019-11-25 ENCOUNTER — Encounter: Payer: Self-pay | Admitting: Internal Medicine

## 2019-11-25 ENCOUNTER — Other Ambulatory Visit: Payer: Self-pay

## 2019-11-25 ENCOUNTER — Other Ambulatory Visit
Admission: RE | Admit: 2019-11-25 | Discharge: 2019-11-25 | Disposition: A | Discharge status: Home / Self Care | Payer: Medicare Other | Attending: Internal Medicine | Admitting: Internal Medicine

## 2019-11-25 ENCOUNTER — Ambulatory Visit (INDEPENDENT_AMBULATORY_CARE_PROVIDER_SITE_OTHER): Payer: Medicare Other | Admitting: Internal Medicine

## 2019-11-25 VITALS — BP 122/84 | HR 86 | Temp 99.0°F | Ht 66.5 in | Wt 224.0 lb

## 2019-11-25 DIAGNOSIS — I10 Essential (primary) hypertension: Secondary | ICD-10-CM

## 2019-11-25 DIAGNOSIS — M810 Age-related osteoporosis without current pathological fracture: Secondary | ICD-10-CM | POA: Diagnosis not present

## 2019-11-25 DIAGNOSIS — E782 Mixed hyperlipidemia: Secondary | ICD-10-CM | POA: Diagnosis not present

## 2019-11-25 DIAGNOSIS — Z0000 Encounter for general adult medical examination without abnormal findings: Secondary | ICD-10-CM | POA: Diagnosis not present

## 2019-11-25 LAB — LIPID PANEL
Cholesterol: 244 mg/dL — ABNORMAL HIGH (ref 0–200)
HDL: 70 mg/dL (ref >40)
LDL Cholesterol: 153 mg/dL — ABNORMAL HIGH (ref 0–99)
Total CHOL/HDL Ratio: 3.5 RATIO
Triglycerides: 104 mg/dL (ref <150)
VLDL: 21 mg/dL (ref 0–40)

## 2019-11-25 LAB — CBC WITH DIFFERENTIAL/PLATELET
Abs Immature Granulocytes: 0.03 10*3/uL (ref 0.00–0.07)
Basophils Absolute: 0 10*3/uL (ref 0.0–0.1)
Basophils Relative: 0 %
Eosinophils Absolute: 0.3 10*3/uL (ref 0.0–0.5)
Eosinophils Relative: 4 %
HCT: 43.3 % (ref 36.0–46.0)
Hemoglobin: 14.6 g/dL (ref 12.0–15.0)
Immature Granulocytes: 0 %
Lymphocytes Relative: 23 %
Lymphs Abs: 1.6 10*3/uL (ref 0.7–4.0)
MCH: 30.8 pg (ref 26.0–34.0)
MCHC: 33.7 g/dL (ref 30.0–36.0)
MCV: 91.4 fL (ref 80.0–100.0)
Monocytes Absolute: 0.6 10*3/uL (ref 0.1–1.0)
Monocytes Relative: 9 %
Neutro Abs: 4.2 10*3/uL (ref 1.7–7.7)
Neutrophils Relative %: 64 %
Platelets: 232 10*3/uL (ref 150–400)
RBC: 4.74 MIL/uL (ref 3.87–5.11)
RDW: 12.1 % (ref 11.5–15.5)
WBC: 6.7 10*3/uL (ref 4.0–10.5)
nRBC: 0 % (ref 0.0–0.2)

## 2019-11-25 LAB — POCT URINALYSIS DIPSTICK
Bilirubin, UA: NEGATIVE
Blood, UA: NEGATIVE
Glucose, UA: NEGATIVE
Ketones, UA: NEGATIVE
Nitrite, UA: NEGATIVE
Protein, UA: NEGATIVE
Spec Grav, UA: 1.01 (ref 1.010–1.025)
Urobilinogen, UA: 0.2 E.U./dL
pH, UA: 5 (ref 5.0–8.0)

## 2019-11-25 LAB — COMPREHENSIVE METABOLIC PANEL
ALT: 21 U/L (ref 0–44)
AST: 23 U/L (ref 15–41)
Albumin: 4.2 g/dL (ref 3.5–5.0)
Alkaline Phosphatase: 72 U/L (ref 38–126)
Anion gap: 7 (ref 5–15)
BUN: 13 mg/dL (ref 8–23)
CO2: 26 mmol/L (ref 22–32)
Calcium: 9.1 mg/dL (ref 8.9–10.3)
Chloride: 107 mmol/L (ref 98–111)
Creatinine, Ser: 1.11 mg/dL — ABNORMAL HIGH (ref 0.44–1.00)
GFR calc Af Amer: 57 mL/min — ABNORMAL LOW (ref >60)
GFR calc non Af Amer: 50 mL/min — ABNORMAL LOW (ref >60)
Glucose, Bld: 103 mg/dL — ABNORMAL HIGH (ref 70–99)
Potassium: 4.3 mmol/L (ref 3.5–5.1)
Sodium: 140 mmol/L (ref 135–145)
Total Bilirubin: 0.9 mg/dL (ref 0.3–1.2)
Total Protein: 6.6 g/dL (ref 6.5–8.1)

## 2019-11-25 LAB — TSH: TSH: 1.988 u[IU]/mL (ref 0.350–4.500)

## 2019-11-25 LAB — VITAMIN D 25 HYDROXY (VIT D DEFICIENCY, FRACTURES): Vit D, 25-Hydroxy: 34.76 ng/mL (ref 30–100)

## 2019-11-25 NOTE — Progress Notes (Signed)
Date:  11/25/2019   Name:  Audrey Martinez   DOB:  1945/06/20   MRN:  [id number]   Chief Complaint: Annual Exam (Medicare Yearly. Breast Exam. )  Audrey Martinez is a 74 y.o. female who presents today for her Complete Annual Exam. She feels fairly well. She reports exercising walking. She reports she is sleeping poorly. Breast complaints - none.  Mammogram:  05/2019 DEXA 01/2019 OP at hip. Pap smear: discontinued Colonoscopy:  2019  Immunization History  Administered Date(s) Administered  . Pneumococcal Conjugate-13 07/22/2015  . Pneumococcal Polysaccharide-23 07/22/2013  . Td 03/03/2002    Hypertension This is a chronic problem. The problem is controlled. Pertinent negatives include no chest pain, headaches, palpitations or shortness of breath. Past treatments include beta blockers, calcium channel blockers and angiotensin blockers. The current treatment provides significant improvement. There are no compliance problems.   Hyperlipidemia This is a chronic problem. The problem is resistant. Pertinent negatives include no chest pain or shortness of breath. Current antihyperlipidemic treatment includes ezetimibe. The current treatment provides mild improvement of lipids.    Lab Results  Component Value Date   CREATININE 1.02 (H) 06/04/2019   BUN 15 06/04/2019   NA 141 06/04/2019   K 3.6 06/04/2019   CL 106 06/04/2019   CO2 28 06/04/2019   Lab Results  Component Value Date   CHOL 218 (H) 11/15/2018   HDL 63 11/15/2018   LDLCALC 131 (H) 11/15/2018   TRIG 120 11/15/2018   CHOLHDL 3.5 11/15/2018   Lab Results  Component Value Date   TSH 1.530 11/15/2018   No results found for: HGBA1C Lab Results  Component Value Date   WBC 7.5 06/04/2019   HGB 14.8 06/04/2019   HCT 43.9 06/04/2019   MCV 91.6 06/04/2019   PLT 206 06/04/2019   Lab Results  Component Value Date   ALT 18 01/01/2019   AST 23 01/01/2019   ALKPHOS 85 01/01/2019   BILITOT 0.7 01/01/2019      Review of Systems  Constitutional: Negative for chills, fatigue and fever.  HENT: Negative for congestion, hearing loss, tinnitus, trouble swallowing and voice change.   Eyes: Negative for visual disturbance.  Respiratory: Negative for cough, chest tightness, shortness of breath and wheezing.   Cardiovascular: Negative for chest pain, palpitations and leg swelling.  Gastrointestinal: Negative for abdominal pain, constipation, diarrhea and vomiting.  Endocrine: Negative for polydipsia and polyuria.  Genitourinary: Negative for dysuria, frequency, genital sores, vaginal bleeding and vaginal discharge.  Musculoskeletal: Positive for arthralgias and back pain. Negative for gait problem and joint swelling.  Skin: Negative for color change and rash.  Neurological: Negative for dizziness, tremors, light-headedness and headaches.  Hematological: Negative for adenopathy. Does not bruise/bleed easily.  Psychiatric/Behavioral: Positive for sleep disturbance (new puppy and late phone calls from her son). Negative for dysphoric mood. The patient is not nervous/anxious.     Patient Active Problem List   Diagnosis Date Noted  . Chronic obstructive pulmonary disease, unspecified COPD type ([place]) 10/08/2019  . Dilated cardiomyopathy ([place]) 05/02/2019  . Amaurosis fugax of right eye 01/13/2019  . Colon cancer screening   . Arm pain, right 11/08/2017  . Renal insufficiency 11/08/2017  . Tendinitis of right knee 07/03/2017  . Lumbar radiculopathy 05/18/2017  . Status post hemilaminotomy 05/18/2017  . Lumbar degenerative disc disease 05/18/2017  . Spinal stenosis of lumbar region without neurogenic claudication 05/18/2017  . Atrial tachycardia ([place]) 04/14/2017  . SVT (supraventricular tachycardia) ([place]) 04/14/2017  .  Essential hypertension 04/14/2017  . Intermittent asthma without complication 06/07/3233  . Osteoporosis 03/21/2017  . Benign essential tremor 03/21/2017  . Obesity 09/14/2011  .  Hyperlipidemia 05/12/2003  . Personal history of transient ischemic attack (TIA), and cerebral infarction without residual deficits 04/22/2003    Allergies  Allergen Reactions  . Amoxicillin Hives, Shortness Of Breath and Swelling    Did it involve swelling of the face/tongue/throat, SOB, or low BP? Yes Did it involve sudden or severe rash/hives, skin peeling, or any reaction on the inside of your mouth or nose? Yes Did you need to seek medical attention at a hospital or doctor's office? Yes When did it last happen?1998 If all above answers are "NO", may proceed with cephalosporin use.   . Aspirin Anaphylaxis  . Ciprofloxacin Rash  . Ciprofloxacin-Dexamethasone Anaphylaxis  . Crestor [Rosuvastatin] Other (See Comments)    Caused problems with memory.  . Morphine And Related Nausea And Vomiting  . Doxycycline Hyclate Hives  . Hydrochlorothiazide Other (See Comments)    Raises her blood pressure.   Marland Kitchen Keflex [Cephalexin] Hives  . Lovastatin Other (See Comments)    "Unable to function"  . Simvastatin Other (See Comments)    "Unable to function"  . Erythromycin Hives  . Ibuprofen Other (See Comments)    Sharp stomach pains  . Zetia [Ezetimibe] Other (See Comments)    Blurry eyes, felt funny, memory problems.   . Neomycin-Bacitracin Zn-Polymyx Hives and Rash  . Omeprazole Palpitations    Tachycardia and same sensation as her SVTs per patient  . Tape Hives and Rash    Past Surgical History:  Procedure Laterality Date  . BACK SURGERY  2017   L2-4 laminectomy and foraminal stenosis  . BREAST CYST ASPIRATION Left 1977  . CARDIAC CATHETERIZATION  2009   [facility] in [place]  . CARDIAC ELECTROPHYSIOLOGY STUDY AND ABLATION  2009  . CATARACT EXTRACTION, BILATERAL  2010  . COLONOSCOPY WITH PROPOFOL N/A 03/21/2018   Procedure: COLONOSCOPY WITH Biopsy;  Surgeon: Lin Landsman, MD;  Location: [facility];  Service: Endoscopy;  Laterality: N/A;   . derrick procedure  1977-78   bilateral   . ESOPHAGOGASTRODUODENOSCOPY  2013   gastritis; done for complaint of dysphagia  . ESOPHAGOGASTRODUODENOSCOPY (EGD) WITH PROPOFOL N/A 03/21/2018   Procedure: ESOPHAGOGASTRODUODENOSCOPY (EGD) WITH Biopsies;  Surgeon: Lin Landsman, MD;  Location: [employer];  Service: Endoscopy;  Laterality: N/A;  KEEP THIS PATIENT FIRST  . LEFT HEART CATH AND CORONARY ANGIOGRAPHY Left 05/02/2019   Procedure: LEFT HEART CATH AND CORONARY ANGIOGRAPHY;  Surgeon: Minna Merritts, MD;  Location: [facility];  Service: Cardiovascular;  Laterality: Left;  . lymph node removal     neck  . MOHS SURGERY  07/2016   BCCA nose  . POLYPECTOMY N/A 03/21/2018   Procedure: POLYPECTOMY;  Surgeon: Lin Landsman, MD;  Location: [facility];  Service: Endoscopy;  Laterality: N/A;  . REPLACEMENT TOTAL KNEE  2013   RT  . TOTAL SHOULDER REPLACEMENT  2012   RT     Social History   Tobacco Use  . Smoking status: Never Smoker  . Smokeless tobacco: Never Used  Vaping Use  . Vaping Use: Never used  Substance Use Topics  . Alcohol use: Not Currently    Comment: may drink 1-2x/yr  . Drug use: No     Medication list has been reviewed and updated.  Current Meds  Medication Sig  . albuterol (PROVENTIL HFA;VENTOLIN  HFA) 108 (90 Base) MCG/ACT inhaler Inhale 1-2 puffs into the lungs every 6 (six) hours as needed for wheezing or shortness of breath.   . B Complex-C (B-COMPLEX WITH VITAMIN C) tablet Take 1 tablet by mouth daily.  . baclofen (LIORESAL) 10 MG tablet TAKE 1 TABLET BY MOUTH THREE TIMES A DAY  . Bioflavonoid Products (ESTER-C) TABS Take 1 tablet by mouth 2 (two) times daily.   . Calcium Carb-Cholecalciferol (CALCIUM 1000 + D PO) Take 1 tablet by mouth daily.  . carvedilol (COREG) 3.125 MG tablet Take 1 tablet (3.125 mg total) by mouth 2 (two) times daily with a meal.  . cholecalciferol (VITAMIN D) 25 MCG (1000 UT) tablet Take 1,000  Units by mouth daily.  . Cranberry 1000 MG CAPS Take 1,000 mg by mouth daily.   Marland Kitchen diltiazem (CARDIZEM) 30 MG tablet Take 1 tablet (30 mg) by mouth three times a day as needed for fast heart rates  . ezetimibe (ZETIA) 10 MG tablet Take 10 mg by mouth daily.  . fluticasone (FLOVENT HFA) 110 MCG/ACT inhaler TAKE 2 PUFFS BY MOUTH TWICE A DAY (Patient taking differently: Inhale 2 puffs into the lungs 2 (two) times daily. )  . fluticasone (FLOVENT HFA) 220 MCG/ACT inhaler Inhale into the lungs 2 (two) times daily.  Marland Kitchen gabapentin (NEURONTIN) 300 MG capsule TAKE 1 CAPSULE (300 MG TOTAL) BY MOUTH 3 (THREE) TIMES DAILY. THREE CAPSULES DAILY AT BEDTIME. (Patient taking differently: Take 300 mg by mouth 3 (three) times daily as needed (nerve pain.). )  . Ginger, Zingiber officinalis, (GINGER PO) Take 1,100 mg by mouth daily.  Marland Kitchen losartan (COZAAR) 25 MG tablet Take 0.5 tablets (12.5 mg total) by mouth daily.  Marland Kitchen MAG ASPART-POTASSIUM ASPART PO Take 1 tablet by mouth 2 (two) times daily. 600 mg-198 mg  . Misc Natural Products (TART CHERRY ADVANCED PO) Take 1 capsule by mouth daily.   . montelukast (SINGULAIR) 10 MG tablet Take 10 mg by mouth every evening.  . Multiple Vitamins-Minerals (MULTIVITAMIN WITH MINERALS) tablet Take 1 tablet by mouth daily. Centrum Silver  . Olodaterol HCl (STRIVERDI RESPIMAT) 2.5 MCG/ACT AERS Inhale 2 puffs into the lungs daily. USE AT THE SAME TIME EVERYDAY  . Probiotic Product (PROBIOTIC DAILY PO) Take 1 capsule by mouth 2 (two) times a week.   . Turmeric 500 MG CAPS Take 500 mg by mouth daily.     PHQ 2/9 Scores 11/25/2019 10/08/2019 01/28/2019 11/15/2018  PHQ - 2 Score 0 0 0 0  PHQ- 9 Score 0 0 - -    GAD 7 : Generalized Anxiety Score 11/25/2019  Nervous, Anxious, on Edge 0  Control/stop worrying 0  Worry too much - different things 0  Trouble relaxing 0  Restless 0  Easily annoyed or irritable 0  Afraid - awful might happen 0  Total GAD 7 Score 0  Anxiety Difficulty Not  difficult at all    BP Readings from Last 3 Encounters:  11/25/19 122/84  10/08/19 124/84  06/10/19 110/88    Physical Exam Vitals and nursing note reviewed.  Constitutional:      General: She is not in acute distress.    Appearance: She is well-developed.  HENT:     Head: Normocephalic and atraumatic.     Right Ear: Tympanic membrane and ear canal normal.     Left Ear: Tympanic membrane and ear canal normal.     Nose:     Right Sinus: No maxillary sinus tenderness.  Left Sinus: No maxillary sinus tenderness.  Eyes:     General: No scleral icterus.       Right eye: No discharge.        Left eye: No discharge.     Conjunctiva/sclera: Conjunctivae normal.  Neck:     Thyroid: No thyromegaly.     Vascular: No carotid bruit.  Cardiovascular:     Rate and Rhythm: Normal rate and regular rhythm.     Pulses: Normal pulses.     Heart sounds: Normal heart sounds.  Pulmonary:     Effort: Pulmonary effort is normal. No respiratory distress.     Breath sounds: No wheezing.  Chest:     Breasts:        Right: No mass, nipple discharge, skin change or tenderness.        Left: No mass, nipple discharge, skin change or tenderness.  Abdominal:     General: Bowel sounds are normal.     Palpations: Abdomen is soft.     Tenderness: There is no abdominal tenderness.  Musculoskeletal:     Cervical back: Normal range of motion. No erythema.     Right lower leg: No edema.     Left lower leg: No edema.  Lymphadenopathy:     Cervical: No cervical adenopathy.  Skin:    General: Skin is warm and dry.     Capillary Refill: Capillary refill takes less than 2 seconds.     Findings: No rash.  Neurological:     General: No focal deficit present.     Mental Status: She is alert and oriented to person, place, and time.     Cranial Nerves: No cranial nerve deficit.     Sensory: No sensory deficit.     Deep Tendon Reflexes: Reflexes are normal and symmetric.  Psychiatric:        Attention  and Perception: Attention normal.        Mood and Affect: Mood normal.     Wt Readings from Last 3 Encounters:  11/25/19 224 lb (101.6 kg)  10/08/19 222 lb (100.7 kg)  06/10/19 223 lb 8 oz (101.4 kg)    BP 122/84 (BP Location: Left Arm, Patient Position: Sitting, Cuff Size: Large)   Pulse 86   Temp 99 F (37.2 C) (Oral)   Ht 5' 6.5" (1.689 m)   Wt 224 lb (101.6 kg)   SpO2 96%   BMI 35.61 kg/m   Assessment and Plan: 1. Annual physical exam Normal exam except for weight Continue to work on diet and exercise as able  2. Essential hypertension Clinically stable exam with well controlled BP on losartan, coreg and diltiazem. Tolerating medications without side effects at this time. Pt to continue current regimen and low sodium diet; benefits of regular exercise as able discussed. - CBC with Differential/Platelet - Comprehensive metabolic panel - TSH - POCT urinalysis dipstick  3. Mixed hyperlipidemia Tolerating Zetia without side effects at this time.  Has been intolerant of Crestor and does not wish to take another statin. Continue same therapy without change at this time. - Lipid panel  4. Age-related osteoporosis without current pathological fracture Continue Vitamin D - check level and advise - VITAMIN D 25 Hydroxy (Vit-D Deficiency, Fractures)   Partially dictated using Editor, commissioning. Any errors are unintentional.  Halina Maidens, MD [facility]  11/25/2019

## 2019-12-07 NOTE — Progress Notes (Signed)
Cardiology Office Note  Date:  12/09/2019   ID:  [name], Audrey Martinez 14, 1947, MRN [id number]  PCP:  Glean Hess, MD   Chief Complaint  Patient presents with  . Other    6 month follow up. Meds reviewed verbally with patient.      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  Non-smoker  orthostasis Previous migraines Nonischemic cardiomyopathy ejection fraction 30 to 35% November 2020 Catheterization with nonobstructive coronary disease Who presents for f/u of her atrial tachycardia, SVT, EF 30 to 35%, possible hypertensive cardiomyopathy, medication intolerances  Overall feels well, Lots of allergies, Denies SOB, chest pain Sometimes with lightheaded spells Works in her garden, pulling weeds, does weed eating Lives alone, son helps her with the mowing Does most of her ADLs  Denies any leg swelling, fluid retention, Weight has been trending up slowly over the past several months  Previous records reviewed on today's visit Echocardiogram April 17, 2019  with ejection fraction 30 to 35%, normal RV function  Cardiac catheterization performed for cardiomyopathy  Catheterization December 2020 Nonobstructive CAD, EF estimated at 30 to 35%, global hypokinesis,  LVEDP 17 to 20 mm Hg  episode of tachycardia, went to the ER 06/03/2018 Rhythm broke before she could get an EKG in the emergency room Rare episodes, does not remember the last time she had an episode   EKG personally reviewed by myself on todays visit Normal sinus rhythm rate 86 bpm no significant ST-T wave changes  Other past medical history reviewed  Right eye vision went dark, 01/02/2019 seen in the ER MRI in the ER, no CVA CT head Encephalomalacia changes in the left frontal region. Left eye: darkness "came down a  bit"  zio monitor,  Normal sinus rhythm  avg HR of 89 bpm. 3 Ventricular Tachycardia runs occurred, the run with the fastest interval lasting 5 beats with a max rate of 200 bpm, the longest lasting 7 beats with an avg rate of 141 bpm.  Patient triggered events were not associated with significant arrhythmia.  Carotid: Minor carotid atherosclerosis. No hemodynamically significant ICA stenosis. Degree of narrowing less than 50% bilaterally by ultrasound criteria.   Echocardiogram May 18, 2018 Ejection fraction 45 to 50% mild diffuse hypokinesis unable to exclude regional wall motion abnormalities Concerning for anteroseptal and apical hypokinesis   Lab Results  Component Value Date   CHOL 244 (H) 11/25/2019   HDL 70 11/25/2019   LDLCALC 153 (H) 11/25/2019   TRIG 104 11/25/2019     Notes from [place] cardiologist Notes indicate 1. Dual AV nodal physiology. 2. Typical slow fast AV nodal reentrant tachycardia. 3. Nonsustained AT / No inducible atrial flutter  She underwent s/p Successful radiofrequency ablation of AV nodal slow pathway.   She had recurrent symptoms shortly after, had event monitor  repeat EPS 01/21/08:  1. Markedly prolonged baseline AH interval (AV nodal function is subnormal)  2. No induceable tachycardia; Only induceable double echo beats As per EP,  event monitor with ectopic atrial tachycardia versus atrial flutter with 2:1 block.   Notes indicating she did not want anticoagulation  PRIOR CARDIAC STUDIES: -ECHOCARDIOGRAM 12/13/06: LVEF 55-60%. No significant valvular abnormalities. Noted to have episodes of atrial flutter at controlled rate. -ETT 09/28/07: Bruce protocol 5:01 minutes. 7 METS. Maximal HR 179 bpm (111% MPHR). Pretest  ECG normal with HR 96 bpm. No chest pain. No significant ischemia.  -STRESS ECHO 11/14/08: Bruce protocol 4:31 minutes. 4.6 METS. 113% MPHR. LVEF 60%. No significant ischemia.  -PSVT s/p ablation of slow pathway for  AVNRT -Hypertension -Hyperlipidemia -History of TIA -Asthma -History of migraines -Right cataract and history of right retinal bleeding over a year ago, now resolved -S/P umbilical hernia repair -S/P tonsillectomy -S/P multiple wrist surgeries -S/P excision of right supraclavicular neck mass (lymph node) -S/P cataract surgery   PMH:   has a past medical history of Actinic keratosis, Amaurosis fugax, right eye (12/2018), Arthritis, Asthma, Basal cell carcinoma (07/2016), Cardiomyopathy ([place]), GERD (gastroesophageal reflux disease), Herniated disc, cervical, Hyperlipidemia, Left wrist fracture, Osteoporosis, Stroke ([place]) (1998), and SVT (supraventricular tachycardia) ([place]).  PSH:    Past Surgical History:  Procedure Laterality Date  . BACK SURGERY  2017   L2-4 laminectomy and foraminal stenosis  . BREAST CYST ASPIRATION Left 1977  . CARDIAC CATHETERIZATION  2009   [facility] in [place]  . CARDIAC ELECTROPHYSIOLOGY STUDY AND ABLATION  2009  . CATARACT EXTRACTION, BILATERAL  2010  . COLONOSCOPY WITH PROPOFOL N/A 03/21/2018   Procedure: COLONOSCOPY WITH Biopsy;  Surgeon: Lin Landsman, MD;  Location: [facility];  Service: Endoscopy;  Laterality: N/A;  . derrick procedure  1977-78   bilateral   . ESOPHAGOGASTRODUODENOSCOPY  2013   gastritis; done for complaint of dysphagia  . ESOPHAGOGASTRODUODENOSCOPY (EGD) WITH PROPOFOL N/A 03/21/2018   Procedure: ESOPHAGOGASTRODUODENOSCOPY (EGD) WITH Biopsies;  Surgeon: Lin Landsman, MD;  Location: [place] [facility];  Service: Endoscopy;  Laterality: N/A;  KEEP THIS PATIENT FIRST  . LEFT HEART CATH AND CORONARY ANGIOGRAPHY Left 05/02/2019   Procedure: LEFT HEART CATH AND CORONARY ANGIOGRAPHY;  Surgeon: Minna Merritts, MD;  Location: [facility];  Service: Cardiovascular;  Laterality: Left;  . lymph node removal     neck  . MOHS SURGERY  07/2016   BCCA nose  . POLYPECTOMY N/A 03/21/2018    Procedure: POLYPECTOMY;  Surgeon: Lin Landsman, MD;  Location: [facility];  Service: Endoscopy;  Laterality: N/A;  . REPLACEMENT TOTAL KNEE  2013   RT  . TOTAL SHOULDER REPLACEMENT  2012   RT     Current Outpatient Medications  Medication Sig Dispense Refill  . albuterol (PROVENTIL HFA;VENTOLIN HFA) 108 (90 Base) MCG/ACT inhaler Inhale 1-2 puffs into the lungs every 6 (six) hours as needed for wheezing or shortness of breath.     . B Complex-C (B-COMPLEX WITH VITAMIN C) tablet Take 1 tablet by mouth daily.    . baclofen (LIORESAL) 10 MG tablet TAKE 1 TABLET BY MOUTH THREE TIMES A DAY 270 tablet 0  . Bioflavonoid Products (ESTER-C) TABS Take 1 tablet by mouth 2 (two) times daily.     . Calcium Carb-Cholecalciferol (CALCIUM 1000 + D PO) Take 1 tablet by mouth daily.    . carvedilol (COREG) 3.125 MG tablet Take 1 tablet (3.125 mg total) by mouth 2 (two) times daily with a meal. 60 tablet 5  . cholecalciferol (VITAMIN D) 25 MCG (1000 UT) tablet Take 1,000 Units by mouth daily.    . Cranberry 1000 MG CAPS Take 1,000 mg by mouth daily.     Marland Kitchen diltiazem (CARDIZEM) 30 MG tablet Take 1 tablet (30 mg) by mouth three times a day as needed for fast heart rates 90 tablet 1  . ezetimibe (ZETIA) 10 MG tablet TAKE 1 TABLET BY MOUTH EVERY DAY 90  tablet 3  . fluticasone (FLOVENT HFA) 110 MCG/ACT inhaler TAKE 2 PUFFS BY MOUTH TWICE A DAY (Patient taking differently: Inhale 2 puffs into the lungs 2 (two) times daily. ) 36 Inhaler 6  . fluticasone (FLOVENT HFA) 220 MCG/ACT inhaler Inhale into the lungs 2 (two) times daily.    Marland Kitchen gabapentin (NEURONTIN) 300 MG capsule TAKE 1 CAPSULE (300 MG TOTAL) BY MOUTH 3 (THREE) TIMES DAILY. THREE CAPSULES DAILY AT BEDTIME. (Patient taking differently: Take 300 mg by mouth 3 (three) times daily as needed (nerve pain.). ) 270 capsule 3  . Ginger, Zingiber officinalis, (GINGER PO) Take 1,100 mg by mouth daily.    Marland Kitchen losartan (COZAAR) 25 MG tablet Take 0.5 tablets  (12.5 mg total) by mouth daily. 45 tablet 3  . MAG ASPART-POTASSIUM ASPART PO Take 1 tablet by mouth 2 (two) times daily. 600 mg-198 mg    . Misc Natural Products (TART CHERRY ADVANCED PO) Take 1 capsule by mouth daily.     . montelukast (SINGULAIR) 10 MG tablet Take 10 mg by mouth every evening.    . Multiple Vitamins-Minerals (MULTIVITAMIN WITH MINERALS) tablet Take 1 tablet by mouth daily. Centrum Silver    . Olodaterol HCl (STRIVERDI RESPIMAT) 2.5 MCG/ACT AERS Inhale 2 puffs into the lungs daily. USE AT THE SAME TIME EVERYDAY 12 g 2  . Probiotic Product (PROBIOTIC DAILY PO) Take 1 capsule by mouth 2 (two) times a week.     . Turmeric 500 MG CAPS Take 500 mg by mouth daily.      No current facility-administered medications for this visit.     Allergies:   Amoxicillin, Aspirin, Ciprofloxacin, Ciprofloxacin-dexamethasone, Crestor [rosuvastatin], Morphine and related, Doxycycline hyclate, Hydrochlorothiazide, Keflex [cephalexin], Lovastatin, Simvastatin, Erythromycin, Ibuprofen, Zetia [ezetimibe], Neomycin-bacitracin zn-polymyx, Omeprazole, and Tape   Social History:  The patient  reports that she has never smoked. She has never used smokeless tobacco. She reports previous alcohol use. She reports that she does not use drugs.   Family History:   family history includes CAD (age of onset: 93) in her sister; Diabetes in her mother; Stroke in her mother; Stroke (age of onset: 33) in her sister; Valvular heart disease in her father.    Review of Systems: Review of Systems  Constitutional: Negative.   HENT: Negative.   Respiratory: Negative.   Cardiovascular: Negative.        Tachycardia episode June 04, 2019  Gastrointestinal: Negative.   Musculoskeletal: Negative.   Psychiatric/Behavioral: Negative.   All other systems reviewed and are negative.   PHYSICAL EXAM: VS:  BP 112/80 (BP Location: Left Arm, Patient Position: Sitting, Cuff Size: Normal)   Pulse 86   Ht 5\' 7"  (1.702 m)    Wt 228 lb (103.4 kg)   SpO2 96%   BMI 35.71 kg/m  , BMI Body mass index is 35.71 kg/m. Constitutional:  oriented to person, place, and time. No distress.  HENT:  Head: Grossly normal Eyes:  no discharge. No scleral icterus.  Neck: No JVD, no carotid bruits  Cardiovascular: Regular rate and rhythm, no murmurs appreciated Pulmonary/Chest: Clear to auscultation bilaterally, no wheezes or rails Abdominal: Soft.  no distension.  no tenderness.  Musculoskeletal: Normal range of motion Neurological:  normal muscle tone. Coordination normal. No atrophy Skin: Skin warm and dry Psychiatric: normal affect, pleasant  Recent Labs: 11/25/2019: ALT 21; BUN 13; Creatinine, Ser 1.11; Hemoglobin 14.6; Platelets 232; Potassium 4.3; Sodium 140; TSH 1.988    Lipid Panel Lab Results  Component Value Date  CHOL 244 (H) 11/25/2019   HDL 70 11/25/2019   LDLCALC 153 (H) 11/25/2019   TRIG 104 11/25/2019      Wt Readings from Last 3 Encounters:  12/09/19 228 lb (103.4 kg)  11/25/19 224 lb (101.6 kg)  10/08/19 222 lb (100.7 kg)       ASSESSMENT AND PLAN:  SVT (supraventricular tachycardia) (HCC) -  Prior ablation  Recommend extra carvedilol as needed for breakthrough tachycardia  Cardiomyopathy, dilated Mildly depressed ejection fraction 2019, worsening drop end of 2000 2030 to 35% Low blood pressure limiting medication titration Continue carvedilol, start losartan 12.5 mg daily Currently with sinus issues, Once that clears up we could try to increase losartan up to 25 mg daily Repeat echocardiogram at her convenience for cardiomyopathy  Atrial tachycardia ([place]) -  Prior monitor showing short run VT and PVCs Previous episode likely atrial tachycardia resolved before she got to the emergency room June 04, 2019 Extra carvedilol as needed for any breakthrough tachycardia, currently stable  Mixed hyperlipidemia - She does not want a statin Also reports Zetia may have caused her eye  problems Recommended lifestyle modification  Personal history of transient ischemic attack (TIA), and cerebral infarction without residual deficits Does not want aspirin, Plavix or warfarin No documentation of atrial fibrillation or flutter  Reactive airway disease/asthma On inhalers, Tolerating carvedilol Discussed taking extra loratadine Flonase nasal spray   Total encounter time more than 25 minutes  Greater than 50% was spent in counseling and coordination of care with the patient  Follow-up 6 months  Orders Placed This Encounter  Procedures  . EKG 12-Lead     Signed, Esmond Plants, M.D., Ph.D. 12/09/2019  [place], [place]

## 2019-12-09 ENCOUNTER — Encounter: Payer: Self-pay | Admitting: Cardiovascular Disease

## 2019-12-09 ENCOUNTER — Ambulatory Visit (INDEPENDENT_AMBULATORY_CARE_PROVIDER_SITE_OTHER): Payer: Medicare Other | Admitting: Cardiovascular Disease

## 2019-12-09 ENCOUNTER — Other Ambulatory Visit: Payer: Self-pay

## 2019-12-09 VITALS — BP 112/80 | HR 86 | Ht 67.0 in | Wt 228.0 lb

## 2019-12-09 DIAGNOSIS — I471 Supraventricular tachycardia: Secondary | ICD-10-CM

## 2019-12-09 DIAGNOSIS — I428 Other cardiomyopathies: Secondary | ICD-10-CM | POA: Diagnosis not present

## 2019-12-09 DIAGNOSIS — I472 Ventricular tachycardia: Secondary | ICD-10-CM | POA: Diagnosis not present

## 2019-12-09 DIAGNOSIS — I10 Essential (primary) hypertension: Secondary | ICD-10-CM

## 2019-12-09 DIAGNOSIS — I5020 Unspecified systolic (congestive) heart failure: Secondary | ICD-10-CM

## 2019-12-09 DIAGNOSIS — I4729 Other ventricular tachycardia: Secondary | ICD-10-CM

## 2019-12-09 DIAGNOSIS — E782 Mixed hyperlipidemia: Secondary | ICD-10-CM

## 2019-12-09 NOTE — Patient Instructions (Addendum)
Medication Instructions:  No changes  If you need a refill on your cardiac medications before your next appointment, please call your pharmacy.    Lab work: No new labs needed   If you have labs (blood work) drawn today and your tests are completely normal, you will receive your results only by: Marland Kitchen MyChart Message (if you have MyChart) OR . A paper copy in the mail If you have any lab test that is abnormal or we need to change your treatment, we will call you to review the results.   Testing/Procedures: We will order an echo for cardiomyopathy, NICM  Your physician has requested that you have an echocardiogram. Echocardiography is a painless test that uses sound waves to create images of your heart. It provides your doctor with information about the size and shape of your heart and how well your heart's chambers and valves are working. This procedure takes approximately one hour. There are no restrictions for this procedure.   Follow-Up: At [facility], you and your health needs are our priority.  As part of our continuing mission to provide you with exceptional heart care, we have created designated Provider Care Teams.  These Care Teams include your primary Cardiologist (physician) and Advanced Practice Providers (APPs -  Physician Assistants and Nurse Practitioners) who all work together to provide you with the care you need, when you need it.  . You will need a follow up appointment in 6 months   . Providers on your designated Care Team:   . Murray Hodgkins, NP . Christell Faith, PA-C . Marrianne Mood, PA-C  Any Other Special Instructions Will Be Listed Below (If Applicable).  For educational health videos Log in to : [email or web address] Or : [email or web address], password : [email or web address]

## 2019-12-29 DIAGNOSIS — Z8616 Personal history of COVID-19: Secondary | ICD-10-CM

## 2019-12-29 HISTORY — DX: Personal history of COVID-19: Z86.16

## 2020-01-09 ENCOUNTER — Ambulatory Visit (INDEPENDENT_AMBULATORY_CARE_PROVIDER_SITE_OTHER): Payer: Medicare Other

## 2020-01-09 ENCOUNTER — Other Ambulatory Visit: Payer: Self-pay

## 2020-01-09 DIAGNOSIS — I428 Other cardiomyopathies: Secondary | ICD-10-CM | POA: Diagnosis not present

## 2020-01-10 LAB — ECHOCARDIOGRAM COMPLETE
AR max vel: 3.67 cm2
AV Area VTI: 3.47 cm2
AV Area mean vel: 3.68 cm2
AV Mean grad: 3 mmHg
AV Peak grad: 5.7 mmHg
Ao pk vel: 1.19 m/s
Area-P 1/2: 5.34 cm2
Calc EF: 30.3 %
P 1/2 time: 567 msec
Single Plane A2C EF: 33 %
Single Plane A4C EF: 28.9 %

## 2020-01-20 ENCOUNTER — Encounter: Payer: Medicare Other | Admitting: Dermatology

## 2020-01-22 ENCOUNTER — Other Ambulatory Visit: Payer: Medicare Other

## 2020-01-22 ENCOUNTER — Other Ambulatory Visit: Payer: Self-pay

## 2020-01-24 ENCOUNTER — Other Ambulatory Visit: Payer: Self-pay | Admitting: Cardiovascular Disease

## 2020-01-26 ENCOUNTER — Inpatient Hospital Stay
Admission: EM | Admit: 2020-01-26 | Discharge: 2020-02-01 | DRG: 177 | Disposition: A | Discharge status: Home Health | Payer: Medicare Other | Attending: Internal Medicine | Admitting: Internal Medicine

## 2020-01-26 ENCOUNTER — Other Ambulatory Visit: Payer: Self-pay

## 2020-01-26 ENCOUNTER — Emergency Department: Payer: Medicare Other

## 2020-01-26 DIAGNOSIS — R404 Transient alteration of awareness: Secondary | ICD-10-CM | POA: Diagnosis not present

## 2020-01-26 DIAGNOSIS — I959 Hypotension, unspecified: Secondary | ICD-10-CM | POA: Diagnosis present

## 2020-01-26 DIAGNOSIS — I420 Dilated cardiomyopathy: Secondary | ICD-10-CM | POA: Diagnosis present

## 2020-01-26 DIAGNOSIS — M810 Age-related osteoporosis without current pathological fracture: Secondary | ICD-10-CM | POA: Diagnosis present

## 2020-01-26 DIAGNOSIS — Z9841 Cataract extraction status, right eye: Secondary | ICD-10-CM | POA: Diagnosis not present

## 2020-01-26 DIAGNOSIS — I471 Supraventricular tachycardia: Secondary | ICD-10-CM | POA: Diagnosis present

## 2020-01-26 DIAGNOSIS — E669 Obesity, unspecified: Secondary | ICD-10-CM | POA: Diagnosis present

## 2020-01-26 DIAGNOSIS — Z8673 Personal history of transient ischemic attack (TIA), and cerebral infarction without residual deficits: Secondary | ICD-10-CM

## 2020-01-26 DIAGNOSIS — I441 Atrioventricular block, second degree: Secondary | ICD-10-CM | POA: Diagnosis present

## 2020-01-26 DIAGNOSIS — J80 Acute respiratory distress syndrome: Secondary | ICD-10-CM | POA: Diagnosis not present

## 2020-01-26 DIAGNOSIS — I2510 Atherosclerotic heart disease of native coronary artery without angina pectoris: Secondary | ICD-10-CM | POA: Diagnosis present

## 2020-01-26 DIAGNOSIS — R002 Palpitations: Secondary | ICD-10-CM

## 2020-01-26 DIAGNOSIS — N179 Acute kidney failure, unspecified: Secondary | ICD-10-CM | POA: Diagnosis present

## 2020-01-26 DIAGNOSIS — I2690 Septic pulmonary embolism without acute cor pulmonale: Secondary | ICD-10-CM | POA: Diagnosis not present

## 2020-01-26 DIAGNOSIS — I5022 Chronic systolic (congestive) heart failure: Secondary | ICD-10-CM | POA: Diagnosis present

## 2020-01-26 DIAGNOSIS — R410 Disorientation, unspecified: Secondary | ICD-10-CM | POA: Diagnosis not present

## 2020-01-26 DIAGNOSIS — R069 Unspecified abnormalities of breathing: Secondary | ICD-10-CM | POA: Diagnosis not present

## 2020-01-26 DIAGNOSIS — Z8249 Family history of ischemic heart disease and other diseases of the circulatory system: Secondary | ICD-10-CM

## 2020-01-26 DIAGNOSIS — I2699 Other pulmonary embolism without acute cor pulmonale: Secondary | ICD-10-CM | POA: Diagnosis present

## 2020-01-26 DIAGNOSIS — J45909 Unspecified asthma, uncomplicated: Secondary | ICD-10-CM | POA: Diagnosis present

## 2020-01-26 DIAGNOSIS — R000 Tachycardia, unspecified: Secondary | ICD-10-CM | POA: Diagnosis not present

## 2020-01-26 DIAGNOSIS — Z886 Allergy status to analgesic agent status: Secondary | ICD-10-CM

## 2020-01-26 DIAGNOSIS — K219 Gastro-esophageal reflux disease without esophagitis: Secondary | ICD-10-CM | POA: Diagnosis present

## 2020-01-26 DIAGNOSIS — I472 Ventricular tachycardia: Secondary | ICD-10-CM | POA: Diagnosis present

## 2020-01-26 DIAGNOSIS — I110 Hypertensive heart disease with heart failure: Secondary | ICD-10-CM | POA: Diagnosis present

## 2020-01-26 DIAGNOSIS — I483 Typical atrial flutter: Secondary | ICD-10-CM | POA: Diagnosis not present

## 2020-01-26 DIAGNOSIS — R778 Other specified abnormalities of plasma proteins: Secondary | ICD-10-CM | POA: Diagnosis not present

## 2020-01-26 DIAGNOSIS — J9601 Acute respiratory failure with hypoxia: Secondary | ICD-10-CM | POA: Diagnosis present

## 2020-01-26 DIAGNOSIS — Z96651 Presence of right artificial knee joint: Secondary | ICD-10-CM | POA: Diagnosis present

## 2020-01-26 DIAGNOSIS — E7800 Pure hypercholesterolemia, unspecified: Secondary | ICD-10-CM | POA: Diagnosis not present

## 2020-01-26 DIAGNOSIS — R911 Solitary pulmonary nodule: Secondary | ICD-10-CM | POA: Diagnosis present

## 2020-01-26 DIAGNOSIS — Z96611 Presence of right artificial shoulder joint: Secondary | ICD-10-CM | POA: Diagnosis present

## 2020-01-26 DIAGNOSIS — J189 Pneumonia, unspecified organism: Secondary | ICD-10-CM | POA: Diagnosis not present

## 2020-01-26 DIAGNOSIS — Z833 Family history of diabetes mellitus: Secondary | ICD-10-CM

## 2020-01-26 DIAGNOSIS — I2601 Septic pulmonary embolism with acute cor pulmonale: Secondary | ICD-10-CM | POA: Diagnosis not present

## 2020-01-26 DIAGNOSIS — J1282 Pneumonia due to coronavirus disease 2019: Secondary | ICD-10-CM | POA: Diagnosis present

## 2020-01-26 DIAGNOSIS — I447 Left bundle-branch block, unspecified: Secondary | ICD-10-CM | POA: Diagnosis present

## 2020-01-26 DIAGNOSIS — Z6832 Body mass index (BMI) 32.0-32.9, adult: Secondary | ICD-10-CM

## 2020-01-26 DIAGNOSIS — R748 Abnormal levels of other serum enzymes: Secondary | ICD-10-CM | POA: Diagnosis present

## 2020-01-26 DIAGNOSIS — G894 Chronic pain syndrome: Secondary | ICD-10-CM | POA: Diagnosis present

## 2020-01-26 DIAGNOSIS — I4892 Unspecified atrial flutter: Secondary | ICD-10-CM | POA: Diagnosis present

## 2020-01-26 DIAGNOSIS — Z91048 Other nonmedicinal substance allergy status: Secondary | ICD-10-CM

## 2020-01-26 DIAGNOSIS — Z823 Family history of stroke: Secondary | ICD-10-CM

## 2020-01-26 DIAGNOSIS — D1809 Hemangioma of other sites: Secondary | ICD-10-CM | POA: Diagnosis not present

## 2020-01-26 DIAGNOSIS — I10 Essential (primary) hypertension: Secondary | ICD-10-CM | POA: Diagnosis not present

## 2020-01-26 DIAGNOSIS — Z885 Allergy status to narcotic agent status: Secondary | ICD-10-CM

## 2020-01-26 DIAGNOSIS — Z9842 Cataract extraction status, left eye: Secondary | ICD-10-CM

## 2020-01-26 DIAGNOSIS — R7989 Other specified abnormal findings of blood chemistry: Secondary | ICD-10-CM | POA: Diagnosis present

## 2020-01-26 DIAGNOSIS — U071 COVID-19: Secondary | ICD-10-CM | POA: Diagnosis not present

## 2020-01-26 DIAGNOSIS — R0602 Shortness of breath: Secondary | ICD-10-CM | POA: Diagnosis not present

## 2020-01-26 DIAGNOSIS — Z79899 Other long term (current) drug therapy: Secondary | ICD-10-CM

## 2020-01-26 DIAGNOSIS — Z85828 Personal history of other malignant neoplasm of skin: Secondary | ICD-10-CM | POA: Diagnosis not present

## 2020-01-26 DIAGNOSIS — E785 Hyperlipidemia, unspecified: Secondary | ICD-10-CM | POA: Diagnosis present

## 2020-01-26 DIAGNOSIS — Z888 Allergy status to other drugs, medicaments and biological substances status: Secondary | ICD-10-CM

## 2020-01-26 HISTORY — DX: COVID-19: U07.1

## 2020-01-26 HISTORY — DX: Pneumonia due to coronavirus disease 2019: J12.82

## 2020-01-26 LAB — COMPREHENSIVE METABOLIC PANEL
ALT: 57 U/L — ABNORMAL HIGH (ref 0–44)
AST: 54 U/L — ABNORMAL HIGH (ref 15–41)
Albumin: 3.6 g/dL (ref 3.5–5.0)
Alkaline Phosphatase: 50 U/L (ref 38–126)
Anion gap: 18 — ABNORMAL HIGH (ref 5–15)
BUN: 26 mg/dL — ABNORMAL HIGH (ref 8–23)
CO2: 17 mmol/L — ABNORMAL LOW (ref 22–32)
Calcium: 8.7 mg/dL — ABNORMAL LOW (ref 8.9–10.3)
Chloride: 102 mmol/L (ref 98–111)
Creatinine, Ser: 1.66 mg/dL — ABNORMAL HIGH (ref 0.44–1.00)
GFR calc Af Amer: 35 mL/min — ABNORMAL LOW (ref >60)
GFR calc non Af Amer: 30 mL/min — ABNORMAL LOW (ref >60)
Glucose, Bld: 113 mg/dL — ABNORMAL HIGH (ref 70–99)
Potassium: 3.9 mmol/L (ref 3.5–5.1)
Sodium: 137 mmol/L (ref 135–145)
Total Bilirubin: 1.9 mg/dL — ABNORMAL HIGH (ref 0.3–1.2)
Total Protein: 6.8 g/dL (ref 6.5–8.1)

## 2020-01-26 LAB — CBC
HCT: 40.5 % (ref 36.0–46.0)
Hemoglobin: 14 g/dL (ref 12.0–15.0)
MCH: 30.6 pg (ref 26.0–34.0)
MCHC: 34.6 g/dL (ref 30.0–36.0)
MCV: 88.4 fL (ref 80.0–100.0)
Platelets: 242 10*3/uL (ref 150–400)
RBC: 4.58 MIL/uL (ref 3.87–5.11)
RDW: 11.8 % (ref 11.5–15.5)
WBC: 5.6 10*3/uL (ref 4.0–10.5)
nRBC: 0 % (ref 0.0–0.2)

## 2020-01-26 LAB — ABO/RH: ABO/RH(D): O POS

## 2020-01-26 LAB — CBC WITH DIFFERENTIAL/PLATELET
Abs Immature Granulocytes: 0.06 10*3/uL (ref 0.00–0.07)
Basophils Absolute: 0 10*3/uL (ref 0.0–0.1)
Basophils Relative: 0 %
Eosinophils Absolute: 0.1 10*3/uL (ref 0.0–0.5)
Eosinophils Relative: 2 %
HCT: 41.4 % (ref 36.0–46.0)
Hemoglobin: 14.1 g/dL (ref 12.0–15.0)
Immature Granulocytes: 1 %
Lymphocytes Relative: 24 %
Lymphs Abs: 1.2 10*3/uL (ref 0.7–4.0)
MCH: 30.5 pg (ref 26.0–34.0)
MCHC: 34.1 g/dL (ref 30.0–36.0)
MCV: 89.4 fL (ref 80.0–100.0)
Monocytes Absolute: 0.6 10*3/uL (ref 0.1–1.0)
Monocytes Relative: 13 %
Neutro Abs: 3 10*3/uL (ref 1.7–7.7)
Neutrophils Relative %: 60 %
Platelets: 245 10*3/uL (ref 150–400)
RBC: 4.63 MIL/uL (ref 3.87–5.11)
RDW: 11.7 % (ref 11.5–15.5)
Smear Review: NORMAL
WBC: 5 10*3/uL (ref 4.0–10.5)
nRBC: 0 % (ref 0.0–0.2)

## 2020-01-26 LAB — SARS CORONAVIRUS 2 BY RT PCR (HOSPITAL ORDER, PERFORMED IN ~~LOC~~ HOSPITAL LAB): SARS Coronavirus 2: POSITIVE — AB

## 2020-01-26 LAB — BRAIN NATRIURETIC PEPTIDE: B Natriuretic Peptide: 95.2 pg/mL (ref 0.0–100.0)

## 2020-01-26 LAB — FIBRIN DERIVATIVES D-DIMER (ARMC ONLY): Fibrin derivatives D-dimer (ARMC): 2755.69 ng/mL (FEU) — ABNORMAL HIGH (ref 0.00–499.00)

## 2020-01-26 LAB — MAGNESIUM: Magnesium: 2.3 mg/dL (ref 1.7–2.4)

## 2020-01-26 LAB — TROPONIN I (HIGH SENSITIVITY)
Troponin I (High Sensitivity): 7 ng/L (ref <18)
Troponin I (High Sensitivity): 60 ng/L — ABNORMAL HIGH (ref <18)

## 2020-01-26 MED ORDER — ACETAMINOPHEN 325 MG PO TABS
650.0000 mg | ORAL_TABLET | Freq: Four times a day (QID) | ORAL | Status: DC | PRN
  Administered 2020-01-26 – 2020-01-27 (×2): 650 mg via ORAL
  Filled 2020-01-26 (×2): qty 2

## 2020-01-26 MED ORDER — DILTIAZEM HCL 30 MG PO TABS
30.0000 mg | ORAL_TABLET | Freq: Three times a day (TID) | ORAL | Status: DC
  Administered 2020-01-26 – 2020-01-27 (×3): 30 mg via ORAL
  Filled 2020-01-26 (×3): qty 1

## 2020-01-26 MED ORDER — FLUTICASONE PROPIONATE HFA 220 MCG/ACT IN AERO
2.0000 | INHALATION_SPRAY | Freq: Two times a day (BID) | RESPIRATORY_TRACT | Status: DC
  Administered 2020-01-26 – 2020-02-01 (×12): 2 via RESPIRATORY_TRACT
  Filled 2020-01-26: qty 12

## 2020-01-26 MED ORDER — MULTI-VITAMIN/MINERALS PO TABS: 1.0000 | ORAL_TABLET | Freq: Every day | ORAL | Status: DC

## 2020-01-26 MED ORDER — CARVEDILOL 3.125 MG PO TABS
3.1250 mg | ORAL_TABLET | Freq: Two times a day (BID) | ORAL | Status: DC
  Administered 2020-01-26 – 2020-01-27 (×3): 3.125 mg via ORAL
  Filled 2020-01-26 (×3): qty 1

## 2020-01-26 MED ORDER — VITAMIN D 25 MCG (1000 UNIT) PO TABS
1000.0000 [IU] | ORAL_TABLET | Freq: Every day | ORAL | Status: DC
  Administered 2020-01-26 – 2020-02-01 (×7): 1000 [IU] via ORAL
  Filled 2020-01-26 (×7): qty 1

## 2020-01-26 MED ORDER — SENNA 8.6 MG PO TABS
1.0000 | ORAL_TABLET | Freq: Two times a day (BID) | ORAL | Status: DC
  Administered 2020-01-26 – 2020-01-31 (×6): 8.6 mg via ORAL
  Filled 2020-01-26 (×13): qty 1

## 2020-01-26 MED ORDER — ENOXAPARIN SODIUM 40 MG/0.4ML ~~LOC~~ SOLN
40.0000 mg | SUBCUTANEOUS | Status: DC
  Administered 2020-01-26: 40 mg via SUBCUTANEOUS
  Filled 2020-01-26 (×2): qty 0.4

## 2020-01-26 MED ORDER — EZETIMIBE 10 MG PO TABS
10.0000 mg | ORAL_TABLET | Freq: Every day | ORAL | Status: DC
  Administered 2020-01-27 – 2020-02-01 (×6): 10 mg via ORAL
  Filled 2020-01-26 (×6): qty 1

## 2020-01-26 MED ORDER — BUDESONIDE 0.5 MG/2ML IN SUSP: 0.5000 mg | Freq: Two times a day (BID) | RESPIRATORY_TRACT | Status: DC

## 2020-01-26 MED ORDER — MONTELUKAST SODIUM 10 MG PO TABS
10.0000 mg | ORAL_TABLET | Freq: Every evening | ORAL | Status: DC
  Administered 2020-01-26 – 2020-01-31 (×6): 10 mg via ORAL
  Filled 2020-01-26 (×6): qty 1

## 2020-01-26 MED ORDER — SODIUM CHLORIDE 0.9 % IV SOLN: Freq: Once | INTRAVENOUS | Status: AC

## 2020-01-26 MED ORDER — DILTIAZEM HCL 25 MG/5ML IV SOLN
20.0000 mg | Freq: Once | INTRAVENOUS | Status: AC
  Administered 2020-01-26: 20 mg via INTRAVENOUS
  Filled 2020-01-26: qty 5

## 2020-01-26 MED ORDER — POLYETHYLENE GLYCOL 3350 17 G PO PACK: 17.0000 g | PACK | Freq: Every day | ORAL | Status: DC | PRN

## 2020-01-26 MED ORDER — SODIUM CHLORIDE 0.9 % IV SOLN
100.0000 mg | Freq: Every day | INTRAVENOUS | Status: AC
  Administered 2020-01-27 – 2020-01-30 (×4): 100 mg via INTRAVENOUS
  Filled 2020-01-26 (×2): qty 100
  Filled 2020-01-26: qty 20
  Filled 2020-01-26: qty 100

## 2020-01-26 MED ORDER — ACETAMINOPHEN 325 MG PO TABS
ORAL_TABLET | ORAL | Status: AC
  Administered 2020-01-26: 650 mg via ORAL
  Filled 2020-01-26: qty 2

## 2020-01-26 MED ORDER — ADULT MULTIVITAMIN W/MINERALS CH
1.0000 | ORAL_TABLET | Freq: Every day | ORAL | Status: DC
  Administered 2020-01-26 – 2020-02-01 (×7): 1 via ORAL
  Filled 2020-01-26 (×7): qty 1

## 2020-01-26 MED ORDER — GUAIFENESIN-DM 100-10 MG/5ML PO SYRP
10.0000 mL | ORAL_SOLUTION | ORAL | Status: DC | PRN
  Administered 2020-01-27 – 2020-01-31 (×3): 10 mL via ORAL
  Filled 2020-01-26 (×4): qty 10

## 2020-01-26 MED ORDER — DILTIAZEM HCL-DEXTROSE 125-5 MG/125ML-% IV SOLN (PREMIX)
5.0000 mg/h | INTRAVENOUS | Status: DC
  Administered 2020-01-26: 5 mg/h via INTRAVENOUS
  Filled 2020-01-26: qty 125

## 2020-01-26 MED ORDER — ONDANSETRON HCL 4 MG/2ML IJ SOLN
4.0000 mg | Freq: Four times a day (QID) | INTRAMUSCULAR | Status: DC | PRN
  Filled 2020-01-26: qty 2

## 2020-01-26 MED ORDER — SODIUM CHLORIDE 0.9 % IV SOLN
200.0000 mg | Freq: Once | INTRAVENOUS | Status: AC
  Administered 2020-01-26: 200 mg via INTRAVENOUS
  Filled 2020-01-26: qty 200

## 2020-01-26 MED ORDER — ACETIC ACID 2 % OT SOLN
4.0000 [drp] | Freq: Four times a day (QID) | OTIC | Status: DC
  Administered 2020-01-26 – 2020-02-01 (×19): 4 [drp] via OTIC
  Filled 2020-01-26: qty 15

## 2020-01-26 MED ORDER — ONDANSETRON HCL 4 MG PO TABS: 4.0000 mg | ORAL_TABLET | Freq: Four times a day (QID) | ORAL | Status: DC | PRN

## 2020-01-26 MED ORDER — ACETAMINOPHEN 325 MG PO TABS: 650.0000 mg | ORAL_TABLET | Freq: Four times a day (QID) | ORAL | Status: DC | PRN

## 2020-01-26 MED ORDER — ALBUTEROL SULFATE HFA 108 (90 BASE) MCG/ACT IN AERS
1.0000 | INHALATION_SPRAY | Freq: Four times a day (QID) | RESPIRATORY_TRACT | Status: DC | PRN
  Filled 2020-01-26: qty 6.7

## 2020-01-26 NOTE — ED Provider Notes (Signed)
[name] Emergency Department Provider Note ____________________________________________   First MD Initiated Contact with Patient 01/26/20 1133     (approximate)  I have reviewed the triage vital signs and the nursing notes.  HISTORY  Chief Complaint Shortness of Breath, Tachycardia, and Chest Pain   HPI Audrey Martinez is a 74 y.o. femalewho presents to the ED for evaluation of shortness of breath.   Chart review indicates extensive cardiac history followed by Dr. Rockey Situ, last seen 6 weeks ago.  History of ablation x2 for PSVT and ablation of slow pathway for AVNRT.  Nonischemic cardiomyopathy with ejection fraction of 30%.  Nonobstructive coronary artery disease without stenting. Typically controlled with Coreg 3.125 twice daily, diltiazem short acting 30 mg 3 times daily as needed for palpitations, and losartan for HTN.    Patient reports her son tested positive for COVID-19, acquiring this from work from a [employer].  Patient has not been vaccinated for COVID-19, because she "does not do vaccines."  She was reports over the past "few days" that she has had subjective fevers and chills, dyspnea and occasional nonproductive cough.    Past Medical History:  Diagnosis Date  . Actinic keratosis   . Amaurosis fugax, right eye 12/2018  . Arthritis   . Asthma   . Basal cell carcinoma 07/2016   central forehead (tx in [place])  . Cardiomyopathy ([place])    a. 04/2018 Echo: EF 45-50%. Anteroseptal and apical HK in some views. Mild AI/MR. Nl RV fxn; b. 05/2018 MV: mid-dist ant and antsept mild defect - ? breast atten. EF 41%. No ischemia; c. 03/2019 Echo: EF 30-35%, Gr2 DD, Trace MR, triv TR. Asc Ao ectatic dil - 21mm.   Marland Kitchen GERD (gastroesophageal reflux disease)   . Herniated disc, cervical   . Hyperlipidemia   . Left wrist fracture   . Osteoporosis   . Stroke ([place]) 1998  . SVT (supraventricular tachycardia) ([place])    a.2008 s/p RFCA for  AVNRT; b. 9 & 02/2019 Zio x 2: 1. Avg HR 89, 3 runs NSVT (longest 7 beats), Mobitz 1. 2. Avg HR 89, NSVT x 1 (5 beats), Mobitz 1. No signif arrhythmia.    Patient Active Problem List   Diagnosis Date Noted  . Chronic obstructive pulmonary disease, unspecified COPD type ([place]) 10/08/2019  . Dilated cardiomyopathy ([place]) 05/02/2019  . Amaurosis fugax of right eye 01/13/2019  . Colon cancer screening   . Arm pain, right 11/08/2017  . Renal insufficiency 11/08/2017  . Tendinitis of right knee 07/03/2017  . Lumbar radiculopathy 05/18/2017  . Status post hemilaminotomy 05/18/2017  . Lumbar degenerative disc disease 05/18/2017  . Spinal stenosis of lumbar region without neurogenic claudication 05/18/2017  . Atrial tachycardia ([place]) 04/14/2017  . SVT (supraventricular tachycardia) ([place]) 04/14/2017  . Essential hypertension 04/14/2017  . Intermittent asthma without complication 69/62/9528  . Osteoporosis 03/21/2017  . Benign essential tremor 03/21/2017  . Obesity 09/14/2011  . Hyperlipidemia 05/12/2003  . Personal history of transient ischemic attack (TIA), and cerebral infarction without residual deficits 04/22/2003    Past Surgical History:  Procedure Laterality Date  . BACK SURGERY  2017   L2-4 laminectomy and foraminal stenosis  . BREAST CYST ASPIRATION Left 1977  . CARDIAC CATHETERIZATION  2009   [facility] in [place]  . CARDIAC ELECTROPHYSIOLOGY STUDY AND ABLATION  2009  . CATARACT EXTRACTION, BILATERAL  2010  . COLONOSCOPY WITH PROPOFOL N/A 03/21/2018   Procedure: COLONOSCOPY WITH Biopsy;  Surgeon: Marius Ditch,  Tally Due, MD;  Location: [facility];  Service: Endoscopy;  Laterality: N/A;  . derrick procedure  1977-78   bilateral   . ESOPHAGOGASTRODUODENOSCOPY  2013   gastritis; done for complaint of dysphagia  . ESOPHAGOGASTRODUODENOSCOPY (EGD) WITH PROPOFOL N/A 03/21/2018   Procedure: ESOPHAGOGASTRODUODENOSCOPY (EGD) WITH Biopsies;  Surgeon: Lin Landsman, MD;  Location: [facility];  Service: Endoscopy;  Laterality: N/A;  KEEP THIS PATIENT FIRST  . LEFT HEART CATH AND CORONARY ANGIOGRAPHY Left 05/02/2019   Procedure: LEFT HEART CATH AND CORONARY ANGIOGRAPHY;  Surgeon: Minna Merritts, MD;  Location: [facility];  Service: Cardiovascular;  Laterality: Left;  . lymph node removal     neck  . MOHS SURGERY  07/2016   BCCA nose  . POLYPECTOMY N/A 03/21/2018   Procedure: POLYPECTOMY;  Surgeon: Lin Landsman, MD;  Location: [facility];  Service: Endoscopy;  Laterality: N/A;  . REPLACEMENT TOTAL KNEE  2013   RT  . TOTAL SHOULDER REPLACEMENT  2012   RT     Prior to Admission medications   Medication Sig Start Date End Date Taking? Authorizing Provider  albuterol (PROVENTIL HFA;VENTOLIN HFA) 108 (90 Base) MCG/ACT inhaler Inhale 1-2 puffs into the lungs every 6 (six) hours as needed for wheezing or shortness of breath.     [provider]  B Complex-C (B-COMPLEX WITH VITAMIN C) tablet Take 1 tablet by mouth daily.    [provider]  baclofen (LIORESAL) 10 MG tablet TAKE 1 TABLET BY MOUTH THREE TIMES A DAY 10/01/19   Glean Hess, MD  Bioflavonoid Products (ESTER-C) TABS Take 1 tablet by mouth 2 (two) times daily.     [provider]  Calcium Carb-Cholecalciferol (CALCIUM 1000 + D PO) Take 1 tablet by mouth daily.    [provider]  carvedilol (COREG) 3.125 MG tablet TAKE 1 TABLET (3.125 MG TOTAL) BY MOUTH 2 (TWO) TIMES DAILY WITH A MEAL. 01/24/20   Minna Merritts, MD  cholecalciferol (VITAMIN D) 25 MCG (1000 UT) tablet Take 1,000 Units by mouth daily.    [provider]  Cranberry 1000 MG CAPS Take 1,000 mg by mouth daily.     [provider]  diltiazem (CARDIZEM) 30 MG tablet Take 1 tablet (30 mg) by mouth three times a day as needed for fast heart rates 03/18/19   Minna Merritts, MD  ezetimibe (ZETIA) 10 MG tablet TAKE 1 TABLET BY MOUTH  EVERY DAY 11/25/19   Glean Hess, MD  fluticasone (FLOVENT HFA) 110 MCG/ACT inhaler TAKE 2 PUFFS BY MOUTH TWICE A DAY Patient taking differently: Inhale 2 puffs into the lungs 2 (two) times daily.  01/22/19   Flora Lipps, MD  fluticasone (FLOVENT HFA) 220 MCG/ACT inhaler Inhale into the lungs 2 (two) times daily.    [provider]  gabapentin (NEURONTIN) 300 MG capsule TAKE 1 CAPSULE (300 MG TOTAL) BY MOUTH 3 (THREE) TIMES DAILY. THREE CAPSULES DAILY AT BEDTIME. Patient taking differently: Take 300 mg by mouth 3 (three) times daily as needed (nerve pain.).  01/23/19   Glean Hess, MD  Ginger, Zingiber officinalis, (GINGER PO) Take 1,100 mg by mouth daily.    [provider]  losartan (COZAAR) 25 MG tablet Take 0.5 tablets (12.5 mg total) by mouth daily. 06/10/19   Minna Merritts, MD  MAG ASPART-POTASSIUM ASPART PO Take 1 tablet by mouth 2 (two) times daily. 600 mg-198 mg    [provider]  Misc Natural Products (TART CHERRY ADVANCED PO) Take 1 capsule by mouth daily.     [provider]  montelukast (SINGULAIR) 10 MG tablet Take 10 mg by mouth every evening. 10/25/18   [provider]  Multiple Vitamins-Minerals (MULTIVITAMIN WITH MINERALS) tablet Take 1 tablet by mouth daily. Centrum Silver    [provider]  Olodaterol HCl (STRIVERDI RESPIMAT) 2.5 MCG/ACT AERS Inhale 2 puffs into the lungs daily. USE AT THE SAME TIME EVERYDAY 10/01/19   Flora Lipps, MD  Probiotic Product (PROBIOTIC DAILY PO) Take 1 capsule by mouth 2 (two) times a week.     [provider]  Turmeric 500 MG CAPS Take 500 mg by mouth daily.     [provider]    Allergies Amoxicillin, Aspirin, Ciprofloxacin, Ciprofloxacin-dexamethasone, Crestor [rosuvastatin], Morphine and related, Doxycycline hyclate, Hydrochlorothiazide, Keflex [cephalexin], Lovastatin, Simvastatin, Erythromycin, Ibuprofen, Zetia [ezetimibe], Neomycin-bacitracin zn-polymyx,  Omeprazole, and Tape  Family History  Problem Relation Age of Onset  . Diabetes Mother   . Stroke Mother   . Valvular heart disease Father   . CAD Sister 34       died of MI  . Stroke Sister 44  . Breast cancer Neg Hx     Social History Social History   Tobacco Use  . Smoking status: Never Smoker  . Smokeless tobacco: Never Used  Vaping Use  . Vaping Use: Never used  Substance Use Topics  . Alcohol use: Not Currently    Comment: may drink 1-2x/yr  . Drug use: No    Review of Systems  Constitutional: Positive for subjective fever/chills Eyes: No visual changes. ENT: No sore throat. Cardiovascular: Denies chest pain. Respiratory: Positive for shortness of breath and nonproductive cough.. Gastrointestinal: No abdominal pain.  No nausea, no vomiting.  No diarrhea.  No constipation. Genitourinary: Negative for dysuria. Musculoskeletal: Negative for back pain. Skin: Negative for rash. Neurological: Negative for headaches, focal weakness or numbness. ____________________________________________   PHYSICAL EXAM:  VITAL SIGNS: Vitals:   01/26/20 1215 01/26/20 1230  BP: 108/64 114/80  Pulse: (!) 111 (!) 114  Resp: 19 19  Temp:    SpO2: 100% 98%     Constitutional: Alert and oriented. Well appearing and in no acute distress.  Obese.  Conversational in full sentences.  Sitting up in bed and well-appearing. Eyes: Conjunctivae are normal. PERRL. EOMI. Head: Atraumatic. Nose: No congestion/rhinnorhea. Mouth/Throat: Mucous membranes are moist.  Oropharynx non-erythematous. Neck: No stridor. No cervical spine tenderness to palpation. Cardiovascular: Tachycardic rate, regular rhythm. Grossly normal heart sounds.  Good peripheral circulation. Respiratory: Normal respiratory effort.  No retractions. Lungs CTAB. Gastrointestinal: Soft , nondistended, nontender to palpation. No abdominal bruits. No CVA tenderness. Musculoskeletal: No lower extremity tenderness nor edema.  No  joint effusions. No signs of acute trauma. Neurologic:  Normal speech and language. No gross focal neurologic deficits are appreciated. No gait instability noted. Skin:  Skin is warm, dry and intact. No rash noted. Psychiatric: Mood and affect are normal. Speech and behavior are normal.  ____________________________________________   LABS (all labs ordered are listed, but only abnormal results are displayed)  Labs Reviewed  SARS CORONAVIRUS 2 BY RT PCR (HOSPITAL ORDER, [facility]) - Abnormal; Notable for the following components:      Result Value   SARS Coronavirus 2 POSITIVE (*)    All other components within normal limits  COMPREHENSIVE METABOLIC PANEL - Abnormal; Notable for the following components:   CO2 17 (*)  Glucose, Bld 113 (*)    BUN 26 (*)    Creatinine, Ser 1.66 (*)    Calcium 8.7 (*)    AST 54 (*)    ALT 57 (*)    Total Bilirubin 1.9 (*)    GFR calc non Af Amer 30 (*)    GFR calc Af Amer 35 (*)    Anion gap 18 (*)    All other components within normal limits  CBC WITH DIFFERENTIAL/PLATELET  MAGNESIUM  BRAIN NATRIURETIC PEPTIDE  TROPONIN I (HIGH SENSITIVITY)  TROPONIN I (HIGH SENSITIVITY)   ____________________________________________  12 Lead EKG No clear P waves.  Appears to be A flutter with 2: 1 block.  Regular rhythm with a rate of 137 bpm.  Normal axis.  Left bundle branch block morphology without evidence of acute ischemia. ____________________________________________  RADIOLOGY  ED MD interpretation: Diffuse and mild interstitial opacities consistent with known COVID-19 diagnosis  Official radiology report(s): DG Chest 1 View  Result Date: 01/26/2020 CLINICAL DATA:  Tachycardia EXAM: CHEST  1 VIEW COMPARISON:  Chest radiograph dated 08/22/2018 FINDINGS: The heart size and mediastinal contours are within normal limits. Mild diffuse bilateral interstitial opacities are noted. Mild patchy bilateral airspace opacities are  noted. There is no pleural effusion or pneumothorax. A right shoulder arthroplasty is redemonstrated. Degenerative changes are seen in the spine. IMPRESSION: Mild bilateral interstitial and airspace opacities may represent pulmonary edema or multifocal pneumonia. Electronically Signed   By: Zerita Boers M.D.   On: 01/26/2020 12:12    ____________________________________________   PROCEDURES and INTERVENTIONS  Procedure(s) performed (including Critical Care):  .Critical Care Performed by: Vladimir Crofts, MD Authorized by: Vladimir Crofts, MD   Critical care provider statement:    Critical care time (minutes):  45   Critical care was necessary to treat or prevent imminent or life-threatening deterioration of the following conditions:  Cardiac failure and circulatory failure   Critical care was time spent personally by me on the following activities:  Discussions with consultants, evaluation of patient's response to treatment, examination of patient, ordering and performing treatments and interventions, ordering and review of laboratory studies, ordering and review of radiographic studies, pulse oximetry, re-evaluation of patient's condition, obtaining history from patient or surrogate and review of old charts  .1-3 Lead EKG Interpretation Performed by: Vladimir Crofts, MD Authorized by: Vladimir Crofts, MD     Interpretation: abnormal     ECG rate:  140   ECG rate assessment: tachycardic     Rhythm: atrial flutter     Ectopy: none     Conduction: normal      Medications  carvedilol (COREG) tablet 3.125 mg (3.125 mg Oral Given 01/26/20 1142)  diltiazem (CARDIZEM) tablet 30 mg (30 mg Oral Given 01/26/20 1142)  diltiazem (CARDIZEM) 125 mg in dextrose 5% 125 mL (1 mg/mL) infusion (5 mg/hr Intravenous New Bag/Given 01/26/20 1229)  diltiazem (CARDIZEM) injection 20 mg (20 mg Intravenous Given 01/26/20 1155)    ____________________________________________   MDM / ED COURSE  74 year old woman  with well-established history of SVT presenting with shortness of breath and palpitations, found to be in rapid a flutter requiring diltiazem drip in the setting of positive COVID-19 testing, requiring medical admission.  Patient presented tachycardic with rates around 140.  Initial EKG demonstrates regular rhythm that appears to be a flutter with 2-1 block, without ischemic features.  Patient provided home dose of her oral medications of carvedilol and diltiazem.  Initial bolus of 20 mg of IV diltiazem was well-tolerated  with transient improvement of her rates to 110-120.  BP maintaining, so diltiazem drip initiated and is well-tolerated at the time of this writing and of admission.  Blood work demonstrates negative troponin and electrolytes WNL.  Blood work also demonstrating evidence of prerenal azotemia and AKI, with evidence of metabolic acidosis and decreased bicarbonate.  Patient is positive for COVID-19, but has no hypoxia and therefore Decadron was not initiated.  In consultation with admitting hospitalist, we do agree to start a course of remdesivir.  Patient admitted to hospitalist service for further work-up and management.  Clinical Course as of Jan 25 1338  Nancy Fetter Jan 26, 2020  1152 Telemetry over the past few minutes was shown similar rhythm, atrial flutter with 2-1 block?.  Rates remain right around 138-142.   [DS]  1338 Spoke with Dr. Posey Pronto in person regarding case. She requests initiation of remdesivir and not Decadron in the setting of COVID-19 positivity without hypoxia.   [DS]    Clinical Course User Index [DS] Vladimir Crofts, MD     ____________________________________________   FINAL CLINICAL IMPRESSION(S) / ED DIAGNOSES  Final diagnoses:  COVID-19  Atrial flutter by electrocardiogram ([place])  Tachycardia  Shortness of breath  Palpitations  AKI (acute kidney injury) [facility])     ED Discharge Orders    None       [place] Mcmahill   Note:  This document was prepared using  Dragon voice recognition software and may include unintentional dictation errors.   Vladimir Crofts, MD 01/26/20 [phone]

## 2020-01-26 NOTE — ED Notes (Signed)
Pt states liquid stools 2-3x/d since 4 d, onset was "orange" colored then became "blackish". Will notify attending MD.

## 2020-01-26 NOTE — ED Notes (Signed)
BP readings consistently high with narrow pulse pressure. Tried cuff on arm and wrist. MD aware.

## 2020-01-26 NOTE — H&P (Addendum)
[facility] NAME: Audrey Martinez    MR#:  [id number]  DATE OF BIRTH:  02-Jul-1945  DATE OF ADMISSION:  01/26/2020  PRIMARY CARE PHYSICIAN: Glean Hess, MD   REQUESTING/REFERRING PHYSICIAN: Dr Vladimir Crofts  Patient coming from : home   CHIEF COMPLAINT:   Weakness, cough, poor appetite HISTORY OF PRESENT ILLNESS:  Audrey Martinez  is a 74 y.o. 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  Patient reports her son tested positive for COVID-19 acquiring from his work at [employer].  She has not been vaccinated. ED course: Afebrile heart rate [phone] blood pressure fluctuating between 108-200 sats 99 200% on room air  Chest x-ray shows Mild bilateral interstitial and airspace opacities may represent pulmonary edema or multifocal pneumonia.  Patient was found to be in atrial flutter/tachycardia. Cardizem drip has been started by ER physician. Patient will be started on IVremdesivir after discussing with her risk and side effects.  She is being admitted with pneumonia secondary to COVID and atrial tachycardia/flutter PAST MEDICAL HISTORY:   Past Medical History:  Diagnosis Date  . Actinic keratosis   . Amaurosis fugax, right eye 12/2018  . Arthritis   . Asthma   . Basal cell carcinoma 07/2016   central forehead (tx in [place])  . Cardiomyopathy ([place])    a. 04/2018 Echo: EF 45-50%. Anteroseptal and apical HK in some views. Mild AI/MR. Nl RV fxn; b. 05/2018 MV: mid-dist ant and antsept mild defect - ? breast atten. EF 41%. No ischemia; c. 03/2019 Echo: EF 30-35%, Gr2 DD, Trace MR, triv TR. Asc Ao ectatic dil - 65mm.   Marland Kitchen GERD (gastroesophageal reflux disease)   . Herniated disc, cervical    . Hyperlipidemia   . Left wrist fracture   . Osteoporosis   . Stroke ([place]) 1998  . SVT (supraventricular tachycardia) ([place])    a.2008 s/p RFCA for AVNRT; b. 9 & 02/2019 Zio x 2: 1. Avg HR 89, 3 runs NSVT (longest 7 beats), Mobitz 1. 2. Avg HR 89, NSVT x 1 (5 beats), Mobitz 1. No signif arrhythmia.    PAST SURGICAL HISTOIRY:   Past Surgical History:  Procedure Laterality Date  . BACK SURGERY  2017   L2-4 laminectomy and foraminal stenosis  . BREAST CYST ASPIRATION Left 1977  . CARDIAC CATHETERIZATION  2009   [facility] in [place]  . CARDIAC ELECTROPHYSIOLOGY STUDY AND ABLATION  2009  . CATARACT EXTRACTION, BILATERAL  2010  . COLONOSCOPY WITH PROPOFOL N/A 03/21/2018   Procedure: COLONOSCOPY WITH Biopsy;  Surgeon: Lin Landsman, MD;  Location: [facility];  Service: Endoscopy;  Laterality: N/A;  . derrick procedure  1977-78   bilateral   . ESOPHAGOGASTRODUODENOSCOPY  2013   gastritis; done for complaint of dysphagia  . ESOPHAGOGASTRODUODENOSCOPY (EGD) WITH PROPOFOL N/A 03/21/2018   Procedure: ESOPHAGOGASTRODUODENOSCOPY (EGD) WITH Biopsies;  Surgeon: Lin Landsman, MD;  Location: [facility];  Service: Endoscopy;  Laterality: N/A;  KEEP THIS PATIENT FIRST  . LEFT HEART CATH AND CORONARY ANGIOGRAPHY Left 05/02/2019   Procedure: LEFT HEART CATH AND CORONARY ANGIOGRAPHY;  Surgeon: Minna Merritts, MD;  Location: [facility];  Service: Cardiovascular;  Laterality: Left;  . lymph node removal  neck  . MOHS SURGERY  07/2016   BCCA nose  . POLYPECTOMY N/A 03/21/2018   Procedure: POLYPECTOMY;  Surgeon: Lin Landsman, MD;  Location: [facility];  Service: Endoscopy;  Laterality: N/A;  . REPLACEMENT TOTAL KNEE  2013   RT  . TOTAL SHOULDER REPLACEMENT  2012   RT     SOCIAL HISTORY:   Social History   Tobacco Use  . Smoking status: Never Smoker  . Smokeless tobacco: Never Used  Substance Use Topics  .  Alcohol use: Not Currently    Comment: may drink 1-2x/yr    FAMILY HISTORY:   Family History  Problem Relation Age of Onset  . Diabetes Mother   . Stroke Mother   . Valvular heart disease Father   . CAD Sister 81       died of MI  . Stroke Sister 36  . Breast cancer Neg Hx     DRUG ALLERGIES:   Allergies  Allergen Reactions  . Amoxicillin Hives, Shortness Of Breath and Swelling    Did it involve swelling of the face/tongue/throat, SOB, or low BP? Yes Did it involve sudden or severe rash/hives, skin peeling, or any reaction on the inside of your mouth or nose? Yes Did you need to seek medical attention at a hospital or doctor's office? Yes When did it last happen?1998 If all above answers are "NO", may proceed with cephalosporin use.   . Aspirin Anaphylaxis  . Ciprofloxacin Rash  . Ciprofloxacin-Dexamethasone Anaphylaxis  . Crestor [Rosuvastatin] Other (See Comments)    Caused problems with memory.  . Morphine And Related Nausea And Vomiting  . Doxycycline Hyclate Hives  . Hydrochlorothiazide Other (See Comments)    Raises her blood pressure.   Marland Kitchen Keflex [Cephalexin] Hives  . Lovastatin Other (See Comments)    "Unable to function"  . Simvastatin Other (See Comments)    "Unable to function"  . Erythromycin Hives  . Ibuprofen Other (See Comments)    Sharp stomach pains  . Zetia [Ezetimibe] Other (See Comments)    Blurry eyes, felt funny, memory problems.   . Neomycin-Bacitracin Zn-Polymyx Hives and Rash  . Omeprazole Palpitations    Tachycardia and same sensation as her SVTs per patient  . Tape Hives and Rash    REVIEW OF SYSTEMS:  Review of Systems  Constitutional: Positive for chills, fever and malaise/fatigue. Negative for weight loss.  HENT: Negative for ear discharge, ear pain and nosebleeds.   Eyes: Negative for blurred vision, pain and discharge.  Respiratory: Positive for shortness of breath. Negative for sputum production, wheezing and stridor.    Cardiovascular: Negative for chest pain, palpitations, orthopnea and PND.  Gastrointestinal: Negative for abdominal pain, diarrhea, nausea and vomiting.  Genitourinary: Negative for frequency and urgency.  Musculoskeletal: Negative for back pain and joint pain.  Neurological: Positive for weakness. Negative for sensory change, speech change and focal weakness.  Psychiatric/Behavioral: Negative for depression and hallucinations. The patient is not nervous/anxious.      MEDICATIONS AT HOME:   Prior to Admission medications   Medication Sig Start Date End Date Taking? Authorizing Provider  albuterol (PROVENTIL HFA;VENTOLIN HFA) 108 (90 Base) MCG/ACT inhaler Inhale 1-2 puffs into the lungs every 6 (six) hours as needed for wheezing or shortness of breath.     [provider]  B Complex-C (B-COMPLEX WITH VITAMIN C) tablet Take 1 tablet by mouth daily.    [provider]  baclofen (LIORESAL) 10 MG tablet TAKE 1 TABLET BY  MOUTH THREE TIMES A DAY 10/01/19   Glean Hess, MD  Bioflavonoid Products (ESTER-C) TABS Take 1 tablet by mouth 2 (two) times daily.     [provider]  Calcium Carb-Cholecalciferol (CALCIUM 1000 + D PO) Take 1 tablet by mouth daily.    [provider]  carvedilol (COREG) 3.125 MG tablet TAKE 1 TABLET (3.125 MG TOTAL) BY MOUTH 2 (TWO) TIMES DAILY WITH A MEAL. 01/24/20   Minna Merritts, MD  cholecalciferol (VITAMIN D) 25 MCG (1000 UT) tablet Take 1,000 Units by mouth daily.    [provider]  Cranberry 1000 MG CAPS Take 1,000 mg by mouth daily.     [provider]  diltiazem (CARDIZEM) 30 MG tablet Take 1 tablet (30 mg) by mouth three times a day as needed for fast heart rates 03/18/19   Minna Merritts, MD  ezetimibe (ZETIA) 10 MG tablet TAKE 1 TABLET BY MOUTH EVERY DAY 11/25/19   Glean Hess, MD  fluticasone (FLOVENT HFA) 110 MCG/ACT inhaler TAKE 2 PUFFS BY MOUTH TWICE A DAY Patient taking differently: Inhale 2  puffs into the lungs 2 (two) times daily.  01/22/19   Flora Lipps, MD  fluticasone (FLOVENT HFA) 220 MCG/ACT inhaler Inhale into the lungs 2 (two) times daily.    [provider]  gabapentin (NEURONTIN) 300 MG capsule TAKE 1 CAPSULE (300 MG TOTAL) BY MOUTH 3 (THREE) TIMES DAILY. THREE CAPSULES DAILY AT BEDTIME. Patient taking differently: Take 300 mg by mouth 3 (three) times daily as needed (nerve pain.).  01/23/19   Glean Hess, MD  Ginger, Zingiber officinalis, (GINGER PO) Take 1,100 mg by mouth daily.    [provider]  losartan (COZAAR) 25 MG tablet Take 0.5 tablets (12.5 mg total) by mouth daily. 06/10/19   Minna Merritts, MD  MAG ASPART-POTASSIUM ASPART PO Take 1 tablet by mouth 2 (two) times daily. 600 mg-198 mg    [provider]  Misc Natural Products (TART CHERRY ADVANCED PO) Take 1 capsule by mouth daily.     [provider]  montelukast (SINGULAIR) 10 MG tablet Take 10 mg by mouth every evening. 10/25/18   [provider]  Multiple Vitamins-Minerals (MULTIVITAMIN WITH MINERALS) tablet Take 1 tablet by mouth daily. Centrum Silver    [provider]  Olodaterol HCl (STRIVERDI RESPIMAT) 2.5 MCG/ACT AERS Inhale 2 puffs into the lungs daily. USE AT THE SAME TIME EVERYDAY 10/01/19   Flora Lipps, MD  Probiotic Product (PROBIOTIC DAILY PO) Take 1 capsule by mouth 2 (two) times a week.     [provider]  Turmeric 500 MG CAPS Take 500 mg by mouth daily.     [provider]      VITAL SIGNS:  Blood pressure 108/71, pulse (!) 109, temperature (!) 97.4 F (36.3 C), temperature source Oral, resp. rate 14, height 5\' 8"  (1.727 m), weight 99.3 kg, SpO2 99 %.  PHYSICAL EXAMINATION:  GENERAL:  74 y.o.-year-old patient lying in the bed with no acute distress. Obese EYES: Pupils equal, round, reactive to light and accommodation. No scleral icterus.  HEENT: Head atraumatic, normocephalic. Oropharynx and nasopharynx clear.   NECK:  Supple, no jugular venous distention. No thyroid enlargement, no tenderness.  LUNGS: Normal breath sounds bilaterally, no wheezing, rales,rhonchi or crepitation. No use of accessory muscles of respiration.  CARDIOVASCULAR: S1, S2 normal. No murmurs, rubs, or gallops. Tachycardia ABDOMEN: Soft, nontender, nondistended. Bowel sounds present. No organomegaly or mass.  EXTREMITIES: No pedal  edema, cyanosis, or clubbing.  NEUROLOGIC: Cranial nerves II through XII are intact. Muscle strength 5/5 in all extremities. Sensation intact. Gait not checked.  PSYCHIATRIC: The patient is alert and oriented x 3.  SKIN: No obvious rash, lesion, or ulcer.   LABORATORY PANEL:   CBC Recent Labs  Lab 01/26/20 1130  WBC 5.0  HGB 14.1  HCT 41.4  PLT 245   ------------------------------------------------------------------------------------------------------------------  Chemistries  Recent Labs  Lab 01/26/20 1130  NA 137  K 3.9  CL 102  CO2 17*  GLUCOSE 113*  BUN 26*  CREATININE 1.66*  CALCIUM 8.7*  MG 2.3  AST 54*  ALT 57*  ALKPHOS 50  BILITOT 1.9*   ------------------------------------------------------------------------------------------------------------------  Cardiac Enzymes No results for input(s): TROPONINI in the last 168 hours. ------------------------------------------------------------------------------------------------------------------  RADIOLOGY:  DG Chest 1 View  Result Date: 01/26/2020 CLINICAL DATA:  Tachycardia EXAM: CHEST  1 VIEW COMPARISON:  Chest radiograph dated 08/22/2018 FINDINGS: The heart size and mediastinal contours are within normal limits. Mild diffuse bilateral interstitial opacities are noted. Mild patchy bilateral airspace opacities are noted. There is no pleural effusion or pneumothorax. A right shoulder arthroplasty is redemonstrated. Degenerative changes are seen in the spine. IMPRESSION: Mild bilateral interstitial and airspace opacities may  represent pulmonary edema or multifocal pneumonia. Electronically Signed   By: Zerita Boers M.D.   On: 01/26/2020 12:12    EKG:    IMPRESSION AND PLAN:  Audrey Martinez  is a 74 y.o. 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  1. Pneumonia secondary to COVID 19 -admit to airborne plus contact isolation -IV remdesivir -incentive spirometer, keep sats greater than 92%, bronchodilators as needed -follow-up CRP, D dimer -out of bed to chair at least twice a day -monitor fever curve -no indication for steroids  2. atrial tachycardia/flutter -patient has history of PSVT, AVNRT and history of fibrillation in the past -currently on Cardizem drip -resume Coreg and oral Cardizem and try to wean off the drip -cardiology consultation with Dr. Rockey Situ-- message sent  3. Hyperlipidemia continue Zetia  4. History of asthma sats stable -continue inhalers  5. Acute renal failure appears prerenal azotemia -creatinine baseline .92 -- 1.2 -comes in with creatinine of 1.66 -will give normal saline , encourage oral fluid intake -hold losartan -avoid nephrotoxic agents  6. DVT prophylaxis subcu Lovenox  Family Communication :none Consults : Cardiology Code Status : full DVT prophylaxis : Lovenox admission status: inpatient  TOTAL TIME TAKING CARE OF THIS PATIENT: **50* minutes.    Fritzi Mandes M.D  [facility]     CC: Primary care physician; Glean Hess, MD

## 2020-01-26 NOTE — ED Notes (Signed)
Pt given ginger ale per request

## 2020-01-26 NOTE — ED Triage Notes (Signed)
Pt states that she started having sob yesterday and was found to be hypoxic and tachycardic, pt had a heart rate of 200 for ems with hx of svt, ems reports pt was more confused on the scene and didn't remember her pastor coming over this am to help her. Pt was given  6mg  adenosine with out relief and then 12mg  of adenosine that brought the pt's rate from 200 to the  130's. Pt denies chest comfort at this time but had c/o it in the back of the truck Pt was diaphoretic as well

## 2020-01-26 NOTE — ED Notes (Signed)
Dinner tray given to pt

## 2020-01-26 NOTE — Consult Note (Signed)
Remdesivir - Pharmacy Brief Note   O:  ALT: 57 CXR: Mild bilateral interstitial and airspace opacities may represent pulmonary edema or multifocal pneumonia. SpO2: 100% on room air    A/P:  Remdesivir 200 mg IVPB once followed by 100 mg IVPB daily x 4 days.   Benn Moulder, PharmD Pharmacy Resident  01/26/2020 1:58 PM

## 2020-01-26 NOTE — ED Notes (Signed)
ED provider at bedside.

## 2020-01-26 NOTE — ED Notes (Signed)
Pt has asked several times when this nurse entered room whether pt was going to be spending night at hospital. This RN restated what ED doc had told pt which was to expect 1-2d stay at [facility].

## 2020-01-27 DIAGNOSIS — I10 Essential (primary) hypertension: Secondary | ICD-10-CM

## 2020-01-27 DIAGNOSIS — E785 Hyperlipidemia, unspecified: Secondary | ICD-10-CM

## 2020-01-27 DIAGNOSIS — U071 COVID-19: Principal | ICD-10-CM

## 2020-01-27 DIAGNOSIS — I420 Dilated cardiomyopathy: Secondary | ICD-10-CM

## 2020-01-27 DIAGNOSIS — Z8673 Personal history of transient ischemic attack (TIA), and cerebral infarction without residual deficits: Secondary | ICD-10-CM

## 2020-01-27 DIAGNOSIS — I471 Supraventricular tachycardia: Secondary | ICD-10-CM

## 2020-01-27 DIAGNOSIS — R778 Other specified abnormalities of plasma proteins: Secondary | ICD-10-CM

## 2020-01-27 LAB — BASIC METABOLIC PANEL
Anion gap: 11 (ref 5–15)
BUN: 21 mg/dL (ref 8–23)
CO2: 20 mmol/L — ABNORMAL LOW (ref 22–32)
Calcium: 8.4 mg/dL — ABNORMAL LOW (ref 8.9–10.3)
Chloride: 109 mmol/L (ref 98–111)
Creatinine, Ser: 1.02 mg/dL — ABNORMAL HIGH (ref 0.44–1.00)
GFR calc Af Amer: >60 mL/min (ref >60)
GFR calc non Af Amer: 55 mL/min — ABNORMAL LOW (ref >60)
Glucose, Bld: 96 mg/dL (ref 70–99)
Potassium: 3.8 mmol/L (ref 3.5–5.1)
Sodium: 140 mmol/L (ref 135–145)

## 2020-01-27 LAB — HEPATIC FUNCTION PANEL
ALT: 49 U/L — ABNORMAL HIGH (ref 0–44)
AST: 39 U/L (ref 15–41)
Albumin: 3.1 g/dL — ABNORMAL LOW (ref 3.5–5.0)
Alkaline Phosphatase: 44 U/L (ref 38–126)
Bilirubin, Direct: 0.3 mg/dL — ABNORMAL HIGH (ref 0.0–0.2)
Indirect Bilirubin: 1 mg/dL — ABNORMAL HIGH (ref 0.3–0.9)
Total Bilirubin: 1.3 mg/dL — ABNORMAL HIGH (ref 0.3–1.2)
Total Protein: 5.8 g/dL — ABNORMAL LOW (ref 6.5–8.1)

## 2020-01-27 LAB — TROPONIN I (HIGH SENSITIVITY)
Troponin I (High Sensitivity): 41 ng/L — ABNORMAL HIGH (ref <18)
Troponin I (High Sensitivity): 25 ng/L — ABNORMAL HIGH (ref <18)

## 2020-01-27 LAB — C-REACTIVE PROTEIN: CRP: 0.8 mg/dL (ref <1.0)

## 2020-01-27 LAB — FIBRIN DERIVATIVES D-DIMER (ARMC ONLY): Fibrin derivatives D-dimer (ARMC): 3054.69 ng/mL (FEU) — ABNORMAL HIGH (ref 0.00–499.00)

## 2020-01-27 LAB — TSH: TSH: 0.552 u[IU]/mL (ref 0.350–4.500)

## 2020-01-27 MED ORDER — BENZONATATE 100 MG PO CAPS
100.0000 mg | ORAL_CAPSULE | Freq: Three times a day (TID) | ORAL | Status: DC
  Administered 2020-01-27 – 2020-02-01 (×14): 100 mg via ORAL
  Filled 2020-01-27 (×14): qty 1

## 2020-01-27 MED ORDER — BACLOFEN 10 MG PO TABS
10.0000 mg | ORAL_TABLET | Freq: Three times a day (TID) | ORAL | Status: DC
  Administered 2020-01-27 – 2020-02-01 (×13): 10 mg via ORAL
  Filled 2020-01-27 (×16): qty 1

## 2020-01-27 MED ORDER — GABAPENTIN 300 MG PO CAPS
300.0000 mg | ORAL_CAPSULE | Freq: Three times a day (TID) | ORAL | Status: DC
  Administered 2020-01-27 – 2020-02-01 (×13): 300 mg via ORAL
  Filled 2020-01-27 (×13): qty 1

## 2020-01-27 MED ORDER — TRAMADOL HCL 50 MG PO TABS
50.0000 mg | ORAL_TABLET | Freq: Three times a day (TID) | ORAL | Status: DC | PRN
  Administered 2020-01-27 (×2): 50 mg via ORAL
  Filled 2020-01-27 (×4): qty 1

## 2020-01-27 MED ORDER — CARVEDILOL 6.25 MG PO TABS
6.2500 mg | ORAL_TABLET | Freq: Two times a day (BID) | ORAL | Status: DC
  Administered 2020-01-27 – 2020-01-28 (×2): 6.25 mg via ORAL
  Filled 2020-01-27 (×2): qty 1

## 2020-01-27 MED ORDER — BACLOFEN 10 MG PO TABS: 10.0000 mg | ORAL_TABLET | Freq: Three times a day (TID) | ORAL | Status: DC

## 2020-01-27 MED ORDER — GABAPENTIN 300 MG PO CAPS: 300.0000 mg | ORAL_CAPSULE | Freq: Three times a day (TID) | ORAL | Status: DC

## 2020-01-27 MED ORDER — DILTIAZEM HCL ER COATED BEADS 120 MG PO CP24
120.0000 mg | ORAL_CAPSULE | Freq: Every day | ORAL | Status: DC
  Administered 2020-01-27: 120 mg via ORAL
  Filled 2020-01-27: qty 1

## 2020-01-27 NOTE — Plan of Care (Signed)
  Problem: Education: Goal: Knowledge of risk factors and measures for prevention of condition will improve Outcome: Progressing   Problem: Coping: Goal: Psychosocial and spiritual needs will be supported Outcome: Progressing   Problem: Respiratory: Goal: Will maintain a patent airway Outcome: Progressing Goal: Complications related to the disease process, condition or treatment will be avoided or minimized Outcome: Progressing   

## 2020-01-27 NOTE — Consult Note (Signed)
Cardiology Consultation:   Patient ID: Audrey Martinez MRN: [id number]; DOB: 1945-07-08  Admit date: 01/26/2020 Date of Consult: 01/27/2020  Primary Care Provider: Glean Hess, MD Primary Cardiologist: Ida Rogue, MD  Primary Electrophysiologist:  None    Patient Profile:   Audrey Martinez is a 74 y.o. female with a hx of NICM, HFrEF (EF 30 to 35%), SVT/AT s/p catheter ablation x2 for slow pathway AVNRT, possible atrial flutter (oral anticoagulation declined in past) hypertension, hyperlipidemia, history of TIA, mild bilateral carotid stenosis, chronic lung disease, orthostasis, GERD, migraines, s/p excision of right supraclavicular neck mass, who is being seen today for the evaluation of atrial tachycardia in the setting of COVID-19 at the request of Dr. Posey Pronto.  History of Present Illness:   Audrey Martinez is a 74 year old female with PMH as above.    Of note, consult was initially listed as syncope; however, on discussion with current hospitalist and attending MD, reason for consult is atrial tachycardia.  She has a history of lung disease with chronic cough/asthma and pneumonia as a child.  She is reportedly non-smoker.  She has a history of orthostasis and previous migraines.  She was previously followed by [facility] cardiologist with history of SVT with dual AV nodal physiology and typical slow fast AV nodal reentry tachycardia.  She subsequently underwent successful radiofrequency ablation of AV nodal slow pathway but had recurrent symptoms requiring repeat EP study 12/2017.  Subsequent event monitoring showed ectopic atrial tachycardia versus atrial flutter with 2-1 block.  She did not want anticoagulation.   She underwent stress testing 09/2007 and 10/2008 without evidence of ischemia.  She established with Dr. Rockey Situ 03/2017 and underwent monitoring in 2019 in the setting of tachypalpitations.  2019 monitoring showed first-degree AV block, Mobitz 1/2, and runs of  NSVT.  Echo EF 45 to 50%.  05/2018 stress testing showed a moderate size region of predominantly fixed perfusion defect of mild to moderate intensity involving the mid to distal anterior and anteroseptal walls.  Breast attenuation cannot be excluded.  No ischemia.  Since then, she has been medically managed.  02/2019 repeat outpatient cardiac monitoring showed predominantly NSR with average heart rate 89 bpm, 1 run of NSVT, second-degree AV block.  EMR notes concern for amaurosis fugax and 12/2018 carotid study showing minor carotid atherosclerosis.  No hemodynamically significant ICA stenosis.  Less than 50% narrowing bilaterally.  She underwent LHC 05/02/2019 that showed no obstructive dz, LVEF 25 to 35% by visual estimate, and elevated LVEDP 17 to 20 mmHg.  Most recent echo 01/09/2020 showed EF 30 to 35%, LV global hypokinesis, average left ventricular global longitudinal strain -9.6%, mild to moderate MR, mild AR, ascending aorta 3.5 cm.  She was last seen in the office 12/09/2019 by Dr. Rockey Situ and reportedly feeling well.  She occasionally felt lightheaded.  She was working in the garden.  She lives alone; however, her son helped her with mowing her yard.  She was able to complete most of her daily ADLs.  Her weight has been trending up slowly over the last several months; however, she denied any other symptoms of worsening heart failure.  She continued to experience rare episodes of tachycardia but did not remember the last time she had an episode.  EKG showed NSR with rate 86 bpm and no significant ST/T changes.  On 01/26/2020, she was admitted to [facility] after presenting to the emergency department with weakness, cough, poor appetite, and migraine headache for 3 days.  Her son had  recently tested positive for COVID-19, which was reportedly contracted at work at the [employer].  She was not vaccinated.  In the ED, she was noted to be afebrile with ventricular rate heart rate 109 to 214 bpm and  pressures hypertensive to soft.  Chest x-ray showed mild bilateral interstitial and airspace opacities, which were thought to represent pulmonary edema versus multifocal pneumonia.  Labs showed creatinine 1.66, BUN 26, AST 54, ALT 57, calcium 8.7, magnesium 2.3, potassium 3.9, BNP 95.2, high-sensitivity troponin 7  60, hemoglobin 14.1, hematocrit 41.4.  EKG showed SVT at 137bpm and LBBB (previous incomplete LBBB) with Cardizem drip initiated.  She was started on IV remdesivir and admitted for COVID-19 and atrial tachycardia/flutter.  Of note, since presenting to the ED, BP and rates have not been well controlled.  Heart Pathway Score:     Past Medical History:  Diagnosis Date  . Actinic keratosis   . Amaurosis fugax, right eye 12/2018  . Arthritis   . Asthma   . Basal cell carcinoma 07/2016   central forehead (tx in [place])  . Cardiomyopathy ([place])    a. 04/2018 Echo: EF 45-50%. Anteroseptal and apical HK in some views. Mild AI/MR. Nl RV fxn; b. 05/2018 MV: mid-dist ant and antsept mild defect - ? breast atten. EF 41%. No ischemia; c. 03/2019 Echo: EF 30-35%, Gr2 DD, Trace MR, triv TR. Asc Ao ectatic dil - 39mm.   Marland Kitchen GERD (gastroesophageal reflux disease)   . Herniated disc, cervical   . Hyperlipidemia   . Left wrist fracture   . Osteoporosis   . Stroke ([place]) 1998  . SVT (supraventricular tachycardia) ([place])    a.2008 s/p RFCA for AVNRT; b. 9 & 02/2019 Zio x 2: 1. Avg HR 89, 3 runs NSVT (longest 7 beats), Mobitz 1. 2. Avg HR 89, NSVT x 1 (5 beats), Mobitz 1. No signif arrhythmia.    Past Surgical History:  Procedure Laterality Date  . BACK SURGERY  2017   L2-4 laminectomy and foraminal stenosis  . BREAST CYST ASPIRATION Left 1977  . CARDIAC CATHETERIZATION  2009   [facility] in [place]  . CARDIAC ELECTROPHYSIOLOGY STUDY AND ABLATION  2009  . CATARACT EXTRACTION, BILATERAL  2010  . COLONOSCOPY WITH PROPOFOL N/A 03/21/2018   Procedure: COLONOSCOPY WITH Biopsy;   Surgeon: Lin Landsman, MD;  Location: [place];  Service: Endoscopy;  Laterality: N/A;  . derrick procedure  1977-78   bilateral   . ESOPHAGOGASTRODUODENOSCOPY  2013   gastritis; done for complaint of dysphagia  . ESOPHAGOGASTRODUODENOSCOPY (EGD) WITH PROPOFOL N/A 03/21/2018   Procedure: ESOPHAGOGASTRODUODENOSCOPY (EGD) WITH Biopsies;  Surgeon: Lin Landsman, MD;  Location: [place];  Service: Endoscopy;  Laterality: N/A;  KEEP THIS PATIENT FIRST  . LEFT HEART CATH AND CORONARY ANGIOGRAPHY Left 05/02/2019   Procedure: LEFT HEART CATH AND CORONARY ANGIOGRAPHY;  Surgeon: Minna Merritts, MD;  Location: [place] [facility];  Service: Cardiovascular;  Laterality: Left;  . lymph node removal     neck  . MOHS SURGERY  07/2016   BCCA nose  . POLYPECTOMY N/A 03/21/2018   Procedure: POLYPECTOMY;  Surgeon: Lin Landsman, MD;  Location: [place];  Service: Endoscopy;  Laterality: N/A;  . REPLACEMENT TOTAL KNEE  2013   RT  . TOTAL SHOULDER REPLACEMENT  2012   RT      Home Medications:  Prior to Admission medications   Medication Sig Start Date End Date Taking? Authorizing Provider  albuterol (PROVENTIL HFA;VENTOLIN HFA) 108 (90 Base) MCG/ACT inhaler Inhale 1-2 puffs into the lungs every 6 (six) hours as needed for wheezing or shortness of breath.    Yes [provider]  B Complex-C (B-COMPLEX WITH VITAMIN C) tablet Take 1 tablet by mouth daily.   Yes [provider]  baclofen (LIORESAL) 10 MG tablet TAKE 1 TABLET BY MOUTH THREE TIMES A DAY Patient taking differently: Take 10 mg by mouth 3 (three) times daily.  10/01/19  Yes Glean Hess, MD  Bioflavonoid Products (ESTER-C) TABS Take 1 tablet by mouth 2 (two) times daily.    Yes [provider]  Calcium Carb-Cholecalciferol (CALCIUM 1000 + D PO) Take 1 tablet by mouth daily.   Yes [provider]  carvedilol (COREG) 3.125 MG tablet TAKE 1 TABLET (3.125 MG  TOTAL) BY MOUTH 2 (TWO) TIMES DAILY WITH A MEAL. 01/24/20  Yes Minna Merritts, MD  cholecalciferol (VITAMIN D) 25 MCG (1000 UT) tablet Take 1,000 Units by mouth daily.   Yes [provider]  Cranberry 1000 MG CAPS Take 1,000 mg by mouth daily.    Yes [provider]  diltiazem (CARDIZEM) 30 MG tablet Take 1 tablet (30 mg) by mouth three times a day as needed for fast heart rates 03/18/19  Yes Gollan, Kathlene November, MD  ezetimibe (ZETIA) 10 MG tablet TAKE 1 TABLET BY MOUTH EVERY DAY Patient taking differently: Take 10 mg by mouth daily.  11/25/19  Yes Glean Hess, MD  fluticasone (FLOVENT HFA) 110 MCG/ACT inhaler TAKE 2 PUFFS BY MOUTH TWICE A DAY Patient taking differently: Inhale 2 puffs into the lungs 2 (two) times daily.  01/22/19  Yes Flora Lipps, MD  fluticasone (FLOVENT HFA) 220 MCG/ACT inhaler Inhale into the lungs 2 (two) times daily.   Yes [provider]  gabapentin (NEURONTIN) 300 MG capsule TAKE 1 CAPSULE (300 MG TOTAL) BY MOUTH 3 (THREE) TIMES DAILY. THREE CAPSULES DAILY AT BEDTIME. Patient taking differently: Take 300 mg by mouth 3 (three) times daily as needed (nerve pain.).  01/23/19  Yes Glean Hess, MD  Ginger, Zingiber officinalis, (GINGER PO) Take 1,100 mg by mouth daily.   Yes [provider]  losartan (COZAAR) 25 MG tablet Take 0.5 tablets (12.5 mg total) by mouth daily. 06/10/19  Yes Gollan, Kathlene November, MD  MAG ASPART-POTASSIUM ASPART PO Take 1 tablet by mouth 2 (two) times daily. 600 mg-198 mg   Yes [provider]  Misc Natural Products (TART CHERRY ADVANCED PO) Take 1 capsule by mouth daily.    Yes [provider]  montelukast (SINGULAIR) 10 MG tablet Take 10 mg by mouth every evening. 10/25/18  Yes [provider]  Multiple Vitamins-Minerals (MULTIVITAMIN WITH MINERALS) tablet Take 1 tablet by mouth daily. Centrum Silver   Yes [provider]  Olodaterol HCl (STRIVERDI RESPIMAT) 2.5 MCG/ACT AERS  Inhale 2 puffs into the lungs daily. USE AT THE SAME TIME EVERYDAY 10/01/19  Yes Flora Lipps, MD  Probiotic Product (PROBIOTIC DAILY PO) Take 1 capsule by mouth 2 (two) times a week.    Yes [provider]  Turmeric 500 MG CAPS Take 500 mg by mouth daily.    Yes [provider]    Inpatient Medications: Scheduled Meds: . acetic acid  4 drop Both EARS QID  . benzonatate  100 mg Oral TID  . carvedilol  3.125 mg Oral BID WC  . cholecalciferol  1,000 Units Oral Daily  . diltiazem  120  mg Oral Daily  . enoxaparin (LOVENOX) injection  40 mg Subcutaneous Q24H  . ezetimibe  10 mg Oral Daily  . fluticasone  2 puff Inhalation BID  . montelukast  10 mg Oral QPM  . multivitamin with minerals  1 tablet Oral Daily  . senna  1 tablet Oral BID   Continuous Infusions: . remdesivir 100 mg in NS 100 mL     PRN Meds: acetaminophen, albuterol, guaiFENesin-dextromethorphan, ondansetron **OR** ondansetron (ZOFRAN) IV, polyethylene glycol, traMADol  Allergies:    Allergies  Allergen Reactions  . Amoxicillin Hives, Shortness Of Breath and Swelling    Did it involve swelling of the face/tongue/throat, SOB, or low BP? Yes Did it involve sudden or severe rash/hives, skin peeling, or any reaction on the inside of your mouth or nose? Yes Did you need to seek medical attention at a hospital or doctor's office? Yes When did it last happen?1998 If all above answers are "NO", may proceed with cephalosporin use.   . Aspirin Anaphylaxis  . Ciprofloxacin Rash  . Ciprofloxacin-Dexamethasone Anaphylaxis  . Crestor [Rosuvastatin] Other (See Comments)    Caused problems with memory.  . Morphine And Related Nausea And Vomiting  . Doxycycline Hyclate Hives  . Hydrochlorothiazide Other (See Comments)    Raises her blood pressure.   Marland Kitchen Keflex [Cephalexin] Hives  . Lovastatin Other (See Comments)    "Unable to function"  . Simvastatin Other (See Comments)    "Unable to function"  .  Erythromycin Hives  . Ibuprofen Other (See Comments)    Sharp stomach pains  . Zetia [Ezetimibe] Other (See Comments)    Blurry eyes, felt funny, memory problems.   . Neomycin-Bacitracin Zn-Polymyx Hives and Rash  . Omeprazole Palpitations    Tachycardia and same sensation as her SVTs per patient  . Tape Hives and Rash    Social History:   Social History   Socioeconomic History  . Marital status: Widowed    Spouse name: Not on file  . Number of children: 3  . Years of education: Not on file  . Highest education level: Not on file  Occupational History  . Occupation: retired  Tobacco Use  . Smoking status: Never Smoker  . Smokeless tobacco: Never Used  Vaping Use  . Vaping Use: Never used  Substance and Sexual Activity  . Alcohol use: Not Currently    Comment: may drink 1-2x/yr  . Drug use: No  . Sexual activity: Never  Other Topics Concern  . Not on file  Social History Narrative   Lives at home alone, has resources if help is needed   Social Determinants of Health   Financial Resource Strain: Low Risk   . Difficulty of Paying Living Expenses: Not hard at all  Food Insecurity: No Food Insecurity  . Worried About Charity fundraiser in the Last Year: Never true  . Ran Out of Food in the Last Year: Never true  Transportation Needs: No Transportation Needs  . Lack of Transportation (Medical): No  . Lack of Transportation (Non-Medical): No  Physical Activity: Insufficiently Active  . Days of Exercise per Week: 3 days  . Minutes of Exercise per Session: 30 min  Stress: No Stress Concern Present  . Feeling of Stress : Not at all  Social Connections: Moderately Integrated  . Frequency of Communication with Friends and Family: More than three times a week  . Frequency of Social Gatherings with Friends and Family: Three times a week  . Attends Religious Services:  More than 4 times per year  . Active Member of Clubs or Organizations: No  . Attends Archivist  Meetings: Never  . Marital Status: Married  Human resources officer Violence: Not At Risk  . Fear of Current or Ex-Partner: No  . Emotionally Abused: No  . Physically Abused: No  . Sexually Abused: No    Family History:    Family History  Problem Relation Age of Onset  . Diabetes Mother   . Stroke Mother   . Valvular heart disease Father   . CAD Sister 57       died of MI  . Stroke Sister 26  . Breast cancer Neg Hx      ROS:  Please see the history of present illness.  Review of Systems  Constitutional: Positive for malaise/fatigue.       Poor appetite  HENT:       Headache  Respiratory: Positive for cough and shortness of breath.   Cardiovascular: Negative for chest pain.       Racing heart rate  Gastrointestinal: Positive for diarrhea and nausea.  Neurological: Positive for weakness.       Confusion noted on review of EMR.  All other systems reviewed and are negative.   All other ROS reviewed and negative.     Physical Exam/Data:   Vitals:   01/27/20 0056 01/27/20 0337 01/27/20 0900 01/27/20 1212  BP: 137/84 136/72 122/74 125/75  Pulse: 91 87  76  Resp: 16 18 20 18   Temp: 97.8 F (36.6 C) 98.3 F (36.8 C) 98 F (36.7 C) 98.5 F (36.9 C)  TempSrc: Oral Oral  Oral  SpO2: 97% 96% 96% 95%  Weight:  97.6 kg    Height:        Intake/Output Summary (Last 24 hours) at 01/27/2020 1232 Last data filed at 01/27/2020 0102 Gross per 24 hour  Intake 730 ml  Output 900 ml  Net -170 ml   Last 3 Weights 01/27/2020 01/26/2020 01/26/2020  Weight (lbs) 215 lb 3.2 oz 215 lb 3.2 oz 219 lb  Weight (kg) 97.614 kg 97.614 kg 99.338 kg     Body mass index is 32.72 kg/m.  Physical exam deferred to MD given positive COVID-19 status and to reduce the risk of multiple exposures to COVID-19 during the pandemic.  EKG:  The EKG was personally reviewed and demonstrates: EKG showed SVT at 137bpm and LBBB (previous incomplete LBBB) Telemetry:  Telemetry was personally reviewed and  demonstrates:  NSR, PVCs  Relevant CV Studies:   12/2019 Echo 1. Left ventricular ejection fraction, by estimation, is 30 to 35%. The  left ventricle has moderately decreased function. The left ventricle  demonstrates global hypokinesis. Left ventricular diastolic parameters are  indeterminate. The average left  ventricular global longitudinal strain is -9.6 %.  2. Right ventricular systolic function is normal. The right ventricular  size is normal. There is normal pulmonary artery systolic pressure.  3. The mitral valve is normal in structure. Mild to moderate mitral valve  regurgitation.  4. The aortic valve was not well visualized. Aortic valve regurgitation  is mild.  5. Ascending aorta 3.5 cm    LHC 04/2019  The left ventricular ejection fraction is 25-35% by visual estimate.  LV end diastolic pressure is moderately elevated.   Monitor 03/2019 Normal sinus rhythm avg HR of 89 bpm.   1 run of Ventricular Tachycardia occurred lasting 5 beats with a max rate of 174 bpm (avg 133 bpm).  Second Degree AV Block-Mobitz I (Wenckebach) was present. Isolated SVEs were rare (<1.0%), SVE Couplets were rare (<1.0%), and SVE Triplets were rare (<1.0%). Isolated VEs were rare (<1.0%), VE Couplets were rare (<1.0%), and no VE Triplets were present. Ventricular Trigeminy was present.  Patient triggered events were not associated with significant arrhythmia --------------------------------------  Notes from [place] cardiologist: Notes indicate 1. Dual AV nodal physiology. 2. Typical slow fast AV nodal reentrant tachycardia. 3. Nonsustained AT / No inducible atrial flutter  She underwent s/p Successful radiofrequency ablation of AV nodal slow pathway.   She had recurrent symptoms shortly after, had event monitor  repeat EPS 01/21/08:  1. Markedly prolonged baseline AH interval (AV nodal function is subnormal)  2. No induceable tachycardia; Only induceable double echo  beats As per EP,  event monitor with ectopic atrial tachycardia versus atrial flutter with 2:1 block.  Notes indicating she did not want anticoagulation  PRIOR CARDIAC STUDIES: -ECHOCARDIOGRAM 12/13/06: LVEF 55-60%. No significant valvular abnormalities. Noted to have episodes of atrial flutter at controlled rate. -ETT 09/28/07: Bruce protocol 5:01 minutes. 7 METS. Maximal HR 179 bpm (111% MPHR). Pretest ECG normal with HR 96 bpm. No chest pain. No significant ischemia.  -STRESS ECHO 11/14/08: Bruce protocol 4:31 minutes. 4.6 METS. 113% MPHR. LVEF 60%. No significant ischemia.  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 -Hypertension -Hyperlipidemia -History of TIA -Asthma -History of migraines -Right cataract and history of right retinal bleeding over a year ago, now resolved -S/P umbilical hernia repair -S/P tonsillectomy -S/P multiple wrist surgeries -S/P excision of right supraclavicular neck mass (lymph node) -S/P cataract surgery  Laboratory Data:  High Sensitivity Troponin:   Recent Labs  Lab 01/26/20 1130 01/26/20 1430  TROPONINIHS 7 60*     Cardiac EnzymesNo results for input(s): TROPONINI in the last 168 hours. No results for input(s): TROPIPOC in the last 168 hours.  Chemistry Recent Labs  Lab 01/26/20 1130 01/27/20 0701  NA 137 140  K 3.9 3.8  CL 102 109  CO2 17* 20*  GLUCOSE 113* 96  BUN 26* 21  CREATININE 1.66* 1.02*  CALCIUM 8.7* 8.4*  GFRNONAA 30* 55*  GFRAA 35* >60  ANIONGAP 18* 11    Recent Labs  Lab 01/26/20 1130  PROT 6.8  ALBUMIN 3.6  AST 54*  ALT 57*  ALKPHOS 50  BILITOT 1.9*   Hematology Recent Labs  Lab 01/26/20 1130 01/26/20 1430  WBC 5.0 5.6  RBC 4.63 4.58  HGB 14.1 14.0  HCT 41.4 40.5  MCV 89.4 88.4  MCH 30.5 30.6  MCHC 34.1 34.6  RDW 11.7 11.8  PLT 245 242   BNP Recent Labs  Lab 01/26/20 1131  BNP 95.2    DDimer No results for input(s): DDIMER in the last 168 hours.   Radiology/Studies:  DG Chest 1  View  Result Date: 01/26/2020 CLINICAL DATA:  Tachycardia EXAM: CHEST  1 VIEW COMPARISON:  Chest radiograph dated 08/22/2018 FINDINGS: The heart size and mediastinal contours are within normal limits. Mild diffuse bilateral interstitial opacities are noted. Mild patchy bilateral airspace opacities are noted. There is no pleural effusion or pneumothorax. A right shoulder arthroplasty is redemonstrated. Degenerative changes are seen in the spine. IMPRESSION: Mild bilateral interstitial and airspace opacities may represent pulmonary edema or multifocal pneumonia. Electronically Signed   By: Zerita Boers M.D.   On: 01/26/2020 12:12    Assessment and Plan:   Paroxysmal SVT History of AVNRT --S/p prior catheter ablation in [place] in 2008 and  2009.  Underwent repeat monitoring as above.  Questionable atrial flutter in the past with anticoagulation declined.  Previously rate controlled on diltiazem; however, in the setting of LV dysfunction, this was transitioned to carvedilol 03/2019 with subsequent up titration 12/09/2019. --SVT at presentation likely in the setting of COVID-19.   --Daily BMET.  Monitor electrolytes with goal potassium 4.0 and magnesium 2.0. --Check TSH. --Discontinued diltiazem in the setting of EF 30 to 35%. --Increased carvedilol to 6.25 mg twice daily to allow for further BP and rate support in the setting of discontinuation of diltiazem. Recommendations for further BP support, if needed, as outlined below.  --Recommend Entresto if Cr/K allows (consider starting Entresto over ARB). --Suspect rates will improve with recovery from her illness.  Current rates are well controlled.  As below, recommend update echo as an outpatient and once recovered from her illness given recent COVID-19.  Dilated cardiomyopathy  NICM, HFrEF (EF 30 to 35%) --2020 echo with EF 30 to 35%. LHC with nonobstructive disease.  Cardiomyopathy thought to be nonischemic in etiology and likely exacerbated by  continued tachycardic rates. --Monitor volume status closely. --Recommend monitor I/os, daily standing weights.  --Low-salt diet and fluid restriction under 2 L daily.   --Daily BMET. Current renal function stable.  --Caution with fluids given reduced EF. --Discontinued diltiazem in the setting of EF 30 to 35%. --Increased carvedilol to 6.25 mg twice daily to allow for further BP and rate support in the setting of discontinuation of diltiazem. --If further BP support needed, recommend low dose ARB (unless choose to start Entresto) +/- spironolactone if Cr/K allows.   --Future considerations include recommendation for Entresto if Cr/K allows (consider starting Entresto over ARB). --Recommend escalate GDMT as tolerated this admission and as an outpatient. --Continue current medications with further recommendations pending MD to see patient.  Recommend repeat echo in the setting of recent COVID-19 once she ahs recovered from her illness.   COVID-19 infection --Per IM/ primary care team.  As above, suspect that rates and BP will improve with recovery from illness.  Elevated high-sensitivity troponin --Likely in the setting of the above.  HS Tn peaked at 60.  EKG without acute ST/T changes.  Suspect supply demand ischemia in the setting of her comorbid conditions, including current COVID-19 infection and elevated ventricular rate and BP at presentation.  No indication for emergent ischemic work-up at this time.  Continue to monitor.  Continue current medications.  As above, consider repeat echo this admission or as an outpatient and once recovered from illness, given her recent COVID-19 infection.  Also as above, recommend Entresto given  reduced EF if renal function and potassium allows.  HTN --Current BP well controlled.  Titrate antihypertensives for BP control. --Discontinued diltiazem in the setting of EF 30 to 35%. --If further BP support needed, recommend titration of Coreg, low-dose ARB  (unless start Entresto over ARB) given stable renal function, +/- spironolactone as Cr/K allows.  Consider addition of Entresto if renal function/K allows given reduced EF, especially if low on repeat echo.  HLD --She has declined a statin in the past.  She reports Zetia may have caused eye problems.  Continue to recommend statin medication.   --Appears to be started on  Zetia -added by primary team.  Continue as tolerated. --Aggressive lifestyle modification.  History of TIA without residual defects --As outlined below, she has a history of TIA without residual defects.  She has declined ASA, Plavix, warfarin.  No clear evidence of atrial fibrillation  or flutter.  Continue to monitor on telemetry.   For questions or updates, please contact [place] Please consult [email or web address] for contact info under     Signed, Arvil Chaco, PA-C  01/27/2020 12:32 PM

## 2020-01-27 NOTE — Progress Notes (Signed)
[facility] Progress Note  Patient: Audrey Martinez    WCH:[id number]  DOA: 01/26/2020     Date of Service: the patient was seen and examined on 01/27/2020  Brief hospital course: Past medical history of nonischemic cardiomyopathy, SVT, AVNRT, HLD, osteoporosis, CVA without residual deficit comes to the hospital with shortness of breath.  Found to have COVID-19 pneumonia. Currently plan is continue current treatment.  Assessment and Plan: 1. Pneumonia secondary to COVID 19 -admit to airborne plus contact isolation -IV remdesivir -incentive spirometer, keep sats greater than 92%, bronchodilators as needed -follow-up CRP, D dimer -out of bed to chair at least twice a day -monitor fever curve -no indication for steroids  2. atrial tachycardia/flutter -patient has history of PSVT, AVNRT and history of fibrillation in the past -currently on Cardizem drip -resume Coreg and oral Cardizem and try to wean off the drip -cardiology consultation with Dr. Rockey Situ-- message sent  3. Hyperlipidemia continue Zetia  4. History of asthma sats stable -continue inhalers  5. Acute renal failure appears prerenal azotemia -creatinine baseline .92 -- 1.2 -comes in with creatinine of 1.66 -will give normal saline , encourage oral fluid intake -hold losartan -avoid nephrotoxic agents  6.  Obesity. Body mass index is 32.72 kg/m.   Diet: Cardiac diet DVT Prophylaxis:   enoxaparin (LOVENOX) injection 40 mg Start: 01/26/20 2200 SCDs Start: 01/26/20 1420    Advance goals of care discussion: Full code  Family Communication: no family was present at bedside, at the time of interview.   Disposition:  Status is: Inpatient  Remains inpatient appropriate because:IV treatments appropriate due to intensity of illness or inability to take PO   Dispo: The patient is from: Home              Anticipated d/c is to: Home              Anticipated d/c date is: 2 days              Patient  currently is not medically stable to d/c.  Subjective: Continues to have shortness of breath but no nausea no vomiting no fever no chills.  No chest pain.  Physical Exam:  General: Appear in mild distress, no Rash; Oral Mucosa Clear, moist. no Abnormal Neck Mass Or lumps, Conjunctiva normal  Cardiovascular: S1 and S2 Present, no Murmur, Respiratory: good respiratory effort, Bilateral Air entry present and bilateral Crackles, no wheezes Abdomen: Bowel Sound present, Soft and no tenderness Extremities: no Pedal edema, no calf tenderness Neurology: alert and oriented to time, place, and person affect appropriate. no new focal deficit Gait not checked due to patient safety concerns  Vitals:   01/27/20 0337 01/27/20 0900 01/27/20 1212 01/27/20 1712  BP: 136/72 122/74 125/75 129/75  Pulse: 87  76 89  Resp: 18 20 18    Temp: 98.3 F (36.8 C) 98 F (36.7 C) 98.5 F (36.9 C) 98 F (36.7 C)  TempSrc: Oral  Oral   SpO2: 96% 96% 95% 100%  Weight: 97.6 kg     Height:        Intake/Output Summary (Last 24 hours) at 01/27/2020 1935 Last data filed at 01/27/2020 1715 Gross per 24 hour  Intake 673.76 ml  Output 1200 ml  Net -526.24 ml   Filed Weights   01/26/20 1125 01/26/20 2031 01/27/20 0337  Weight: 99.3 kg 97.6 kg 97.6 kg    Data Reviewed: I have personally reviewed and interpreted daily labs, tele strips, imagings as discussed above. I  reviewed all nursing notes, pharmacy notes, vitals, pertinent old records I have discussed plan of care as described above with RN and patient/family.  CBC: Recent Labs  Lab 01/26/20 1130 01/26/20 1430  WBC 5.0 5.6  NEUTROABS 3.0  --   HGB 14.1 14.0  HCT 41.4 40.5  MCV 89.4 88.4  PLT 245 053   Basic Metabolic Panel: Recent Labs  Lab 01/26/20 1130 01/27/20 0701  NA 137 140  K 3.9 3.8  CL 102 109  CO2 17* 20*  GLUCOSE 113* 96  BUN 26* 21  CREATININE 1.66* 1.02*  CALCIUM 8.7* 8.4*  MG 2.3  --     Studies: No results found.    Scheduled Meds: . acetic acid  4 drop Both EARS QID  . baclofen  10 mg Oral TID  . benzonatate  100 mg Oral TID  . carvedilol  6.25 mg Oral BID WC  . cholecalciferol  1,000 Units Oral Daily  . enoxaparin (LOVENOX) injection  40 mg Subcutaneous Q24H  . ezetimibe  10 mg Oral Daily  . fluticasone  2 puff Inhalation BID  . gabapentin  300 mg Oral TID  . montelukast  10 mg Oral QPM  . multivitamin with minerals  1 tablet Oral Daily  . senna  1 tablet Oral BID   Continuous Infusions: . remdesivir 100 mg in NS 100 mL 100 mg (01/27/20 1300)   PRN Meds: acetaminophen, albuterol, guaiFENesin-dextromethorphan, ondansetron **OR** ondansetron (ZOFRAN) IV, polyethylene glycol, traMADol  Time spent: 35 minutes  Author: Berle Mull, MD [facility] 01/27/2020 7:35 PM  To reach On-call, see care teams to locate the attending and reach out via [email or web address]. Between 7PM-7AM, please contact night-coverage If you still have difficulty reaching the attending provider, please page the [facility] (Director on Call) for [facility] on amion for assistance.

## 2020-01-28 ENCOUNTER — Inpatient Hospital Stay: Payer: Medicare Other

## 2020-01-28 ENCOUNTER — Encounter: Payer: Self-pay | Admitting: Internal Medicine

## 2020-01-28 DIAGNOSIS — I2699 Other pulmonary embolism without acute cor pulmonale: Secondary | ICD-10-CM

## 2020-01-28 HISTORY — DX: Other pulmonary embolism without acute cor pulmonale: I26.99

## 2020-01-28 LAB — COMPREHENSIVE METABOLIC PANEL
ALT: 47 U/L — ABNORMAL HIGH (ref 0–44)
AST: 33 U/L (ref 15–41)
Albumin: 3.4 g/dL — ABNORMAL LOW (ref 3.5–5.0)
Alkaline Phosphatase: 44 U/L (ref 38–126)
Anion gap: 9 (ref 5–15)
BUN: 21 mg/dL (ref 8–23)
CO2: 21 mmol/L — ABNORMAL LOW (ref 22–32)
Calcium: 8.7 mg/dL — ABNORMAL LOW (ref 8.9–10.3)
Chloride: 108 mmol/L (ref 98–111)
Creatinine, Ser: 0.86 mg/dL (ref 0.44–1.00)
GFR calc Af Amer: >60 mL/min (ref >60)
GFR calc non Af Amer: >60 mL/min (ref >60)
Glucose, Bld: 96 mg/dL (ref 70–99)
Potassium: 3.8 mmol/L (ref 3.5–5.1)
Sodium: 138 mmol/L (ref 135–145)
Total Bilirubin: 1.4 mg/dL — ABNORMAL HIGH (ref 0.3–1.2)
Total Protein: 6 g/dL — ABNORMAL LOW (ref 6.5–8.1)

## 2020-01-28 LAB — CBC
HCT: 40.1 % (ref 36.0–46.0)
Hemoglobin: 14.3 g/dL (ref 12.0–15.0)
MCH: 31.8 pg (ref 26.0–34.0)
MCHC: 35.7 g/dL (ref 30.0–36.0)
MCV: 89.1 fL (ref 80.0–100.0)
Platelets: 236 10*3/uL (ref 150–400)
RBC: 4.5 MIL/uL (ref 3.87–5.11)
RDW: 11.9 % (ref 11.5–15.5)
WBC: 6.4 10*3/uL (ref 4.0–10.5)
nRBC: 0 % (ref 0.0–0.2)

## 2020-01-28 LAB — MAGNESIUM: Magnesium: 2.2 mg/dL (ref 1.7–2.4)

## 2020-01-28 LAB — FIBRIN DERIVATIVES D-DIMER (ARMC ONLY): Fibrin derivatives D-dimer (ARMC): 4970.64 ng/mL (FEU) — ABNORMAL HIGH (ref 0.00–499.00)

## 2020-01-28 LAB — C-REACTIVE PROTEIN: CRP: 0.6 mg/dL (ref <1.0)

## 2020-01-28 MED ORDER — ENOXAPARIN SODIUM 100 MG/ML ~~LOC~~ SOLN
1.0000 mg/kg | Freq: Two times a day (BID) | SUBCUTANEOUS | Status: DC
  Administered 2020-01-28 – 2020-01-30 (×4): 97.5 mg via SUBCUTANEOUS
  Filled 2020-01-28 (×6): qty 1

## 2020-01-28 MED ORDER — LEVALBUTEROL TARTRATE 45 MCG/ACT IN AERO
1.0000 | INHALATION_SPRAY | Freq: Four times a day (QID) | RESPIRATORY_TRACT | Status: DC
  Administered 2020-01-28 – 2020-02-01 (×16): 2 via RESPIRATORY_TRACT
  Filled 2020-01-28 (×2): qty 15

## 2020-01-28 MED ORDER — METOPROLOL TARTRATE 5 MG/5ML IV SOLN
5.0000 mg | INTRAVENOUS | Status: DC | PRN
  Administered 2020-01-28 – 2020-01-31 (×2): 5 mg via INTRAVENOUS
  Filled 2020-01-28 (×2): qty 5

## 2020-01-28 MED ORDER — METOPROLOL TARTRATE 5 MG/5ML IV SOLN
5.0000 mg | Freq: Once | INTRAVENOUS | Status: AC
  Administered 2020-01-28: 5 mg via INTRAVENOUS

## 2020-01-28 MED ORDER — IOHEXOL 350 MG/ML SOLN
75.0000 mL | Freq: Once | INTRAVENOUS | Status: AC | PRN
  Administered 2020-01-28: 75 mL via INTRAVENOUS

## 2020-01-28 MED ORDER — METHYLPREDNISOLONE SODIUM SUCC 40 MG IJ SOLR
40.0000 mg | Freq: Every day | INTRAMUSCULAR | Status: DC
  Administered 2020-01-28 – 2020-01-29 (×2): 40 mg via INTRAVENOUS
  Filled 2020-01-28 (×2): qty 1

## 2020-01-28 MED ORDER — CARVEDILOL 12.5 MG PO TABS: 12.5000 mg | ORAL_TABLET | Freq: Two times a day (BID) | ORAL | Status: DC

## 2020-01-28 MED ORDER — MOMETASONE FURO-FORMOTEROL FUM 200-5 MCG/ACT IN AERO
2.0000 | INHALATION_SPRAY | Freq: Two times a day (BID) | RESPIRATORY_TRACT | Status: DC
  Administered 2020-01-28 – 2020-02-01 (×8): 2 via RESPIRATORY_TRACT
  Filled 2020-01-28: qty 8.8

## 2020-01-28 MED ORDER — DM-GUAIFENESIN ER 30-600 MG PO TB12
1.0000 | ORAL_TABLET | Freq: Two times a day (BID) | ORAL | Status: DC
  Administered 2020-01-28 – 2020-02-01 (×9): 1 via ORAL
  Filled 2020-01-28 (×9): qty 1

## 2020-01-28 MED ORDER — METOPROLOL TARTRATE 25 MG PO TABS
25.0000 mg | ORAL_TABLET | Freq: Two times a day (BID) | ORAL | Status: DC
  Administered 2020-01-28 – 2020-01-30 (×6): 25 mg via ORAL
  Filled 2020-01-28 (×6): qty 1

## 2020-01-28 MED ORDER — LEVALBUTEROL HCL 1.25 MG/0.5ML IN NEBU
1.2500 mg | INHALATION_SOLUTION | Freq: Four times a day (QID) | RESPIRATORY_TRACT | Status: DC
  Filled 2020-01-28 (×2): qty 0.5

## 2020-01-28 NOTE — Progress Notes (Signed)
Progress Note  Patient Name: Audrey Martinez Date of Encounter: 01/28/2020  [facility] Cardiologist: Ida Rogue, MD   Subjective   Worsening respiratory status past 12 hours Increased cough, coughing fits Heart rate steadily increasing starting 5 AM Sinus tach, rate now up in the 140s No improvement in rate despite giving metoprolol IV push and higher dose carvedilol this morning Blood pressure low but stable  Inpatient Medications    Scheduled Meds: . acetic acid  4 drop Both EARS QID  . baclofen  10 mg Oral TID  . benzonatate  100 mg Oral TID  . cholecalciferol  1,000 Units Oral Daily  . dextromethorphan-guaiFENesin  1 tablet Oral BID  . enoxaparin (LOVENOX) injection  40 mg Subcutaneous Q24H  . ezetimibe  10 mg Oral Daily  . fluticasone  2 puff Inhalation BID  . gabapentin  300 mg Oral TID  . levalbuterol  1-2 puff Inhalation Q6H  . levalbuterol  1.25 mg Nebulization Q6H  . metoprolol tartrate  25 mg Oral BID  . mometasone-formoterol  2 puff Inhalation BID  . montelukast  10 mg Oral QPM  . multivitamin with minerals  1 tablet Oral Daily  . senna  1 tablet Oral BID   Continuous Infusions: . remdesivir 100 mg in NS 100 mL 100 mg (01/28/20 0914)   PRN Meds: acetaminophen, guaiFENesin-dextromethorphan, metoprolol tartrate, ondansetron **OR** ondansetron (ZOFRAN) IV, polyethylene glycol, traMADol   Vital Signs    Vitals:   01/28/20 0950 01/28/20 1100 01/28/20 1115 01/28/20 1215  BP: (!) 108/59 (!) 96/54 (!) 89/56 105/60  Pulse: (!) 129 (!) 135 (!) 132 (!) 140  Resp: 18 20 18 18   Temp: 98.4 F (36.9 C) 98.2 F (36.8 C) 98.2 F (36.8 C) 98.4 F (36.9 C)  TempSrc:  Oral Oral   SpO2: 96% 96% 95% 96%  Weight:      Height:        Intake/Output Summary (Last 24 hours) at 01/28/2020 1320 Last data filed at 01/28/2020 0454 Gross per 24 hour  Intake 433.76 ml  Output 1000 ml  Net -566.24 ml   Last 3 Weights 01/28/2020 01/27/2020 01/26/2020  Weight  (lbs) 215 lb 8 oz 215 lb 3.2 oz 215 lb 3.2 oz  Weight (kg) 97.75 kg 97.614 kg 97.614 kg      Telemetry    Sinus tachycardia- Personally Reviewed  ECG     - Personally Reviewed   Labs    High Sensitivity Troponin:   Recent Labs  Lab 01/26/20 1130 01/26/20 1430 01/27/20 0643 01/27/20 1401  TROPONINIHS 7 60* 41* 25*      Chemistry Recent Labs  Lab 01/26/20 1130 01/27/20 0643 01/27/20 0701 01/28/20 0457  NA 137  --  140 138  K 3.9  --  3.8 3.8  CL 102  --  109 108  CO2 17*  --  20* 21*  GLUCOSE 113*  --  96 96  BUN 26*  --  21 21  CREATININE 1.66*  --  1.02* 0.86  CALCIUM 8.7*  --  8.4* 8.7*  PROT 6.8 5.8*  --  6.0*  ALBUMIN 3.6 3.1*  --  3.4*  AST 54* 39  --  33  ALT 57* 49*  --  47*  ALKPHOS 50 44  --  44  BILITOT 1.9* 1.3*  --  1.4*  GFRNONAA 30*  --  55* >60  GFRAA 35*  --  >60 >60  ANIONGAP 18*  --  11 9  Hematology Recent Labs  Lab 01/26/20 1130 01/26/20 1430 01/28/20 0457  WBC 5.0 5.6 6.4  RBC 4.63 4.58 4.50  HGB 14.1 14.0 14.3  HCT 41.4 40.5 40.1  MCV 89.4 88.4 89.1  MCH 30.5 30.6 31.8  MCHC 34.1 34.6 35.7  RDW 11.7 11.8 11.9  PLT 245 242 236    BNP Recent Labs  Lab 01/26/20 1131  BNP 95.2     DDimer No results for input(s): DDIMER in the last 168 hours.   Radiology    No results found.  Cardiac Studies   Echocardiogram 1. Left ventricular ejection fraction, by estimation, is 30 to 35%. The  left ventricle has moderately decreased function. The left ventricle  demonstrates global hypokinesis. Left ventricular diastolic parameters are  indeterminate. The average left  ventricular global longitudinal strain is -9.6 %.  2. Right ventricular systolic function is normal. The right ventricular  size is normal. There is normal pulmonary artery systolic pressure.  3. The mitral valve is normal in structure. Mild to moderate mitral valve  regurgitation.  4. The aortic valve was not well visualized. Aortic valve  regurgitation  is mild.  5. Ascending aorta 3.5 cm  Patient Profile   74 year old female with history of nonischemic cardiomyopathy with an EF of 30 to 35%, paroxysmal SVTs status post catheter ablation twice, recurrent atrial tachycardia, essential hypertension and multiple other comorbidities listed.  We are being asked to see for intermittent tachycardia in the setting of COVID-19 pneumonia. She was admitted yesterday with weakness, cough, headache and poor appetite.  She tested positive for COVID-19.  She contracted illness from her son.  She was noted to have intermittent tachycardia which was treated with diltiazem drip.  She was started on treatment for COVID-19.   Assessment & Plan    covid 19/respiratory distress/PNA On IV remdesivir, bronchodilators, Placed on 2 L nasal cannula, Continuous saturations set up, will hooked up to telemetry for close monitoring For any desaturations we will need to rapidly advance her supplemental oxygen  Sinus tachycardia Starting around 5 AM, continues to trend upwards, No improvement with beta-blockers IV or p.o. Suspect driven by underlying respiratory distress Oxygen added, mild improvement in rate down to 130 bpm, Setting of oxygen saturations for close monitoring We will change carvedilol to metoprolol tartrate 25 twice daily, Also has metoprolol tartrate IV push Blood pressure low but stable She is relatively asymptomatic apart from worsening cough  Cardiomyopathy, nonischemic Recent echocardiogram ejection fraction 30 to 35%, chronic High risk of complications in the setting of above  SVT/atrial tachycardia We will continue metoprolol as above    For questions or updates, please contact [facility] Please consult [email or web address] for contact info under        Signed, Ida Rogue, MD  01/28/2020, 1:20 PM

## 2020-01-28 NOTE — Plan of Care (Signed)
  Problem: Respiratory: Goal: Complications related to the disease process, condition or treatment will be avoided or minimized Outcome: Progressing   

## 2020-01-28 NOTE — Consult Note (Signed)
[facility] for Therapeutic Enoxaparin Indication: pulmonary embolus  Allergies  Allergen Reactions  . Amoxicillin Hives, Shortness Of Breath and Swelling    Did it involve swelling of the face/tongue/throat, SOB, or low BP? Yes Did it involve sudden or severe rash/hives, skin peeling, or any reaction on the inside of your mouth or nose? Yes Did you need to seek medical attention at a hospital or doctor's office? Yes When did it last happen?1998 If all above answers are "NO", may proceed with cephalosporin use.   . Aspirin Anaphylaxis  . Ciprofloxacin Rash  . Ciprofloxacin-Dexamethasone Anaphylaxis  . Crestor [Rosuvastatin] Other (See Comments)    Caused problems with memory.  . Morphine And Related Nausea And Vomiting  . Doxycycline Hyclate Hives  . Hydrochlorothiazide Other (See Comments)    Raises her blood pressure.   Marland Kitchen Keflex [Cephalexin] Hives  . Lovastatin Other (See Comments)    "Unable to function"  . Simvastatin Other (See Comments)    "Unable to function"  . Erythromycin Hives  . Ibuprofen Other (See Comments)    Sharp stomach pains  . Zetia [Ezetimibe] Other (See Comments)    Blurry eyes, felt funny, memory problems.   . Neomycin-Bacitracin Zn-Polymyx Hives and Rash  . Omeprazole Palpitations    Tachycardia and same sensation as her SVTs per patient  . Tape Hives and Rash    Patient Measurements: Height: 5\' 8"  (172.7 cm) Weight: 97.8 kg (215 lb 8 oz) IBW/kg (Calculated) : 63.9  Vital Signs: Temp: 98.4 F (36.9 C) (08/31 1215) Temp Source: Oral (08/31 1115) BP: 105/60 (08/31 1215) Pulse Rate: 140 (08/31 1215)  Labs: Recent Labs    01/26/20 1130 01/26/20 1130 01/26/20 1430 01/27/20 0643 01/27/20 0701 01/27/20 1401 01/28/20 0457  HGB 14.1   < > 14.0  --   --   --  14.3  HCT 41.4  --  40.5  --   --   --  40.1  PLT 245  --  242  --   --   --  236  CREATININE 1.66*  --   --   --  1.02*  --  0.86   TROPONINIHS 7   < > 60* 41*  --  25*  --    < > = values in this interval not displayed.    Estimated Creatinine Clearance: 72.3 mL/min (by C-G formula based on SCr of 0.86 mg/dL).   Assessment: Patient is a 74 y/o F with medical history including NICM with EF 30 to 35% who is admitted with COVID-19 pneumonia. CTA today with findings including bilateral pulmonary emboli, without right heart strain. Pharmacy has been consulted to start therapeutic enoxaparin for treatment of PE.   Plan:  --Enoxaparin 97.5 mg (1 mg/kg) q12h  --CBC per protocol  Benita Gutter 01/28/2020,7:19 PM

## 2020-01-28 NOTE — Progress Notes (Signed)
CT scan showing bilateral PE Covid pneumonia bilaterally, diffuse groundglass appearance Reasons above likely causing sinus tachycardia We will continue metoprolol tartrate for rate control Rate improved with 3 L oxygen and metoprolol We will discontinue Lovenox 40 mg dose, start heparin infusion to treat PE  TGollan

## 2020-01-28 NOTE — Progress Notes (Signed)
[facility] Progress Note  Patient: Audrey Martinez    XKG:[id number]  DOA: 01/26/2020     Date of Service: the patient was seen and examined on 01/28/2020  Brief hospital course: Past medical history of nonischemic cardiomyopathy, SVT, AVNRT, HLD, osteoporosis, CVA without residual deficit comes to the hospital with shortness of breath.  Found to have COVID-19 pneumonia. Currently plan is continue current treatment.  Assessment and Plan: 1. Acute COVID-19 Viral Pneumonia CXR: hazy bilateral peripheral opacities CT chest: pending Oxygen requirements: room air at rest  CRP: normal Remdesivir: started on 01/26/2020 Steroids: normal CRP and no hypoxia but pt with significant resp distress and tachycardia and tachypnea. So will add steroids.  Baricitinib/Actemra(off-label use): not indicated. Not discussed  Antibiotics:none Vitamin C and Zinc: added DVT Prophylaxis: enoxaparin (LOVENOX) injection 40 mg Start: 01/26/20 2200 SCDs Start: 01/26/20 1420  Prone positioning: Patient encouraged to stay in prone position as much as possible.  The treatment plan and use of medications and known side effects were discussed with patient/family. It was clearly explained that Complete risks and long-term side effects are unknown, however in the best clinical judgment they seem to be of some clinical benefit rather than medical risks. Patient/family agree with the treatment plan and want to receive these treatments as indicated.   2. persistent sinus tachycardia/ SVT/atrial tachycardia Potential flutter but still not ruled in. Patient has history of PSVT, AVNRT and history of fibrillation in the past Initially on Cardizem drip Due to Low EF stop Cardizem  Was on [place]  Cardiology consultation with Dr. Rockey Situ Due to persistent tachycardia pt will be on lopressor.  Not a candidate for Entresto per cardio, will be on losartan once HR is better.  Will check CTPE protocol due to elevated d  dimer and tachycardia  3. Hyperlipidemia continue Zetia  4. History of asthma -continue inhalers, no nebulizer due to covid.   5. Acute kidney injury  appears prerenal creatinine baseline .92 -- 1.2 comes in with creatinine of 1.66 hold losartan Improved with fluids avoid nephrotoxic agents  6.  Obesity. Body mass index is 32.77 kg/m.   7. Elevated LFT Likely due to covid.  Mild  Monitor   8. Chronic pain syndrome  Continue baclofen 10 mg 3 times daily. Gabapentin 300 mg 3 times daily. Tramadol as needed.  Diet: Cardiac diet DVT Prophylaxis:   enoxaparin (LOVENOX) injection 40 mg Start: 01/26/20 2200 SCDs Start: 01/26/20 1420   Advance goals of care discussion: Full code  Family Communication: no family was present at bedside, at the time of interview.   Disposition:  Status is: Inpatient  Remains inpatient appropriate because:IV treatments appropriate due to intensity of illness or inability to take PO  Dispo: The patient is from: Home              Anticipated d/c is to: Home              Anticipated d/c date is: 2 days              Patient currently is not medically stable to d/c.  Subjective: Denies any chest pain.  Has significant cough spell.  Also tachycardic since this morning.  Tachypneic since this morning.  No nausea no vomiting.  No fever no chills.  No dizziness no lightheadedness.  Physical Exam:  General: Appear in moderate distress, no Rash; Oral Mucosa Clear, moist. no Abnormal Neck Mass Or lumps, Conjunctiva normal  Cardiovascular: S1 and S2 Present, no Murmur, Respiratory:  increased respiratory effort, Bilateral Air entry present and bilateral Crackles, no wheezes Abdomen: Bowel Sound present, Soft and no tenderness Extremities: no Pedal edema Neurology: alert and oriented to time, place, and person affect appropriate. no new focal deficit Gait not checked due to patient safety concerns   Vitals:   01/28/20 0950 01/28/20 1100  01/28/20 1115 01/28/20 1215  BP: (!) 108/59 (!) 96/54 (!) 89/56 105/60  Pulse: (!) 129 (!) 135 (!) 132 (!) 140  Resp: 18 20 18 18   Temp: 98.4 F (36.9 C) 98.2 F (36.8 C) 98.2 F (36.8 C) 98.4 F (36.9 C)  TempSrc:  Oral Oral   SpO2: 96% 96% 95% 96%  Weight:      Height:        Intake/Output Summary (Last 24 hours) at 01/28/2020 1620 Last data filed at 01/28/2020 0454 Gross per 24 hour  Intake 433.76 ml  Output 700 ml  Net -266.24 ml   Filed Weights   01/26/20 2031 01/27/20 0337 01/28/20 0500  Weight: 97.6 kg 97.6 kg 97.8 kg    Data Reviewed: I have personally reviewed and interpreted daily labs, tele strips, imagings as discussed above. I reviewed all nursing notes, pharmacy notes, vitals, pertinent old records I have discussed plan of care as described above with RN and patient/family.  CBC: Recent Labs  Lab 01/26/20 1130 01/26/20 1430 01/28/20 0457  WBC 5.0 5.6 6.4  NEUTROABS 3.0  --   --   HGB 14.1 14.0 14.3  HCT 41.4 40.5 40.1  MCV 89.4 88.4 89.1  PLT 245 242 790   Basic Metabolic Panel: Recent Labs  Lab 01/26/20 1130 01/27/20 0701 01/28/20 0457  NA 137 140 138  K 3.9 3.8 3.8  CL 102 109 108  CO2 17* 20* 21*  GLUCOSE 113* 96 96  BUN 26* 21 21  CREATININE 1.66* 1.02* 0.86  CALCIUM 8.7* 8.4* 8.7*  MG 2.3  --  2.2    Studies: No results found.  Scheduled Meds: . acetic acid  4 drop Both EARS QID  . baclofen  10 mg Oral TID  . benzonatate  100 mg Oral TID  . cholecalciferol  1,000 Units Oral Daily  . dextromethorphan-guaiFENesin  1 tablet Oral BID  . enoxaparin (LOVENOX) injection  40 mg Subcutaneous Q24H  . ezetimibe  10 mg Oral Daily  . fluticasone  2 puff Inhalation BID  . gabapentin  300 mg Oral TID  . levalbuterol  1-2 puff Inhalation Q6H  . methylPREDNISolone (SOLU-MEDROL) injection  40 mg Intravenous Daily  . metoprolol tartrate  25 mg Oral BID  . mometasone-formoterol  2 puff Inhalation BID  . montelukast  10 mg Oral QPM  .  multivitamin with minerals  1 tablet Oral Daily  . senna  1 tablet Oral BID   Continuous Infusions: . remdesivir 100 mg in NS 100 mL 100 mg (01/28/20 0914)   PRN Meds: acetaminophen, guaiFENesin-dextromethorphan, metoprolol tartrate, ondansetron **OR** ondansetron (ZOFRAN) IV, polyethylene glycol, traMADol  Time spent: 35 minutes  Author: Berle Mull, MD [facility] 01/28/2020 4:20 PM  To reach On-call, see care teams to locate the attending and reach out via [email or web address]. Between 7PM-7AM, please contact night-coverage If you still have difficulty reaching the attending provider, please page the [facility] (Director on Call) for [facility] on amion for assistance.

## 2020-01-29 ENCOUNTER — Ambulatory Visit: Payer: Medicare Other

## 2020-01-29 DIAGNOSIS — J1282 Pneumonia due to coronavirus disease 2019: Secondary | ICD-10-CM

## 2020-01-29 DIAGNOSIS — J9601 Acute respiratory failure with hypoxia: Secondary | ICD-10-CM | POA: Diagnosis present

## 2020-01-29 DIAGNOSIS — I2699 Other pulmonary embolism without acute cor pulmonale: Secondary | ICD-10-CM | POA: Diagnosis present

## 2020-01-29 DIAGNOSIS — R000 Tachycardia, unspecified: Secondary | ICD-10-CM

## 2020-01-29 DIAGNOSIS — R911 Solitary pulmonary nodule: Secondary | ICD-10-CM

## 2020-01-29 DIAGNOSIS — I2690 Septic pulmonary embolism without acute cor pulmonale: Secondary | ICD-10-CM

## 2020-01-29 HISTORY — DX: Other pulmonary embolism without acute cor pulmonale: I26.99

## 2020-01-29 HISTORY — DX: Acute respiratory failure with hypoxia: J96.01

## 2020-01-29 LAB — CBC
HCT: 41.2 % (ref 36.0–46.0)
Hemoglobin: 13.8 g/dL (ref 12.0–15.0)
MCH: 30.9 pg (ref 26.0–34.0)
MCHC: 33.5 g/dL (ref 30.0–36.0)
MCV: 92.2 fL (ref 80.0–100.0)
Platelets: 208 10*3/uL (ref 150–400)
RBC: 4.47 MIL/uL (ref 3.87–5.11)
RDW: 11.9 % (ref 11.5–15.5)
WBC: 7.4 10*3/uL (ref 4.0–10.5)
nRBC: 0 % (ref 0.0–0.2)

## 2020-01-29 LAB — COMPREHENSIVE METABOLIC PANEL
ALT: 49 U/L — ABNORMAL HIGH (ref 0–44)
AST: 33 U/L (ref 15–41)
Albumin: 3.1 g/dL — ABNORMAL LOW (ref 3.5–5.0)
Alkaline Phosphatase: 45 U/L (ref 38–126)
Anion gap: 10 (ref 5–15)
BUN: 20 mg/dL (ref 8–23)
CO2: 22 mmol/L (ref 22–32)
Calcium: 8.9 mg/dL (ref 8.9–10.3)
Chloride: 108 mmol/L (ref 98–111)
Creatinine, Ser: 1.02 mg/dL — ABNORMAL HIGH (ref 0.44–1.00)
GFR calc Af Amer: >60 mL/min (ref >60)
GFR calc non Af Amer: 55 mL/min — ABNORMAL LOW (ref >60)
Glucose, Bld: 100 mg/dL — ABNORMAL HIGH (ref 70–99)
Potassium: 4.1 mmol/L (ref 3.5–5.1)
Sodium: 140 mmol/L (ref 135–145)
Total Bilirubin: 1.2 mg/dL (ref 0.3–1.2)
Total Protein: 5.6 g/dL — ABNORMAL LOW (ref 6.5–8.1)

## 2020-01-29 LAB — PHOSPHORUS: Phosphorus: 3.5 mg/dL (ref 2.5–4.6)

## 2020-01-29 LAB — MAGNESIUM: Magnesium: 2.1 mg/dL (ref 1.7–2.4)

## 2020-01-29 LAB — LACTATE DEHYDROGENASE: LDH: 245 U/L — ABNORMAL HIGH (ref 98–192)

## 2020-01-29 LAB — FIBRIN DERIVATIVES D-DIMER (ARMC ONLY): Fibrin derivatives D-dimer (ARMC): >7500 ng/mL (FEU) — ABNORMAL HIGH (ref 0.00–499.00)

## 2020-01-29 LAB — FERRITIN: Ferritin: 629 ng/mL — ABNORMAL HIGH (ref 11–307)

## 2020-01-29 LAB — C-REACTIVE PROTEIN: CRP: 0.8 mg/dL (ref <1.0)

## 2020-01-29 MED ORDER — METHYLPREDNISOLONE SODIUM SUCC 40 MG IJ SOLR
40.0000 mg | Freq: Two times a day (BID) | INTRAMUSCULAR | Status: DC
  Administered 2020-01-29 – 2020-02-01 (×6): 40 mg via INTRAVENOUS
  Filled 2020-01-29 (×6): qty 1

## 2020-01-29 MED ORDER — ASCORBIC ACID 500 MG PO TABS
500.0000 mg | ORAL_TABLET | Freq: Every day | ORAL | Status: DC
  Administered 2020-01-29 – 2020-02-01 (×4): 500 mg via ORAL
  Filled 2020-01-29 (×4): qty 1

## 2020-01-29 MED ORDER — ZINC SULFATE 220 (50 ZN) MG PO CAPS
220.0000 mg | ORAL_CAPSULE | Freq: Every day | ORAL | Status: DC
  Administered 2020-01-29 – 2020-02-01 (×4): 220 mg via ORAL
  Filled 2020-01-29 (×4): qty 1

## 2020-01-29 NOTE — Plan of Care (Signed)
  Problem: Education: Goal: Knowledge of risk factors and measures for prevention of condition will improve Outcome: Progressing   Problem: Coping: Goal: Psychosocial and spiritual needs will be supported Outcome: Progressing   Problem: Respiratory: Goal: Will maintain a patent airway Outcome: Progressing   

## 2020-01-29 NOTE — Progress Notes (Addendum)
Progress Note  Patient Name: Audrey Martinez Date of Encounter: 01/29/2020  [facility] Cardiologist: Ida Rogue, MD   Subjective   No acute events overnight.  States she feels fine, denies chest pain or shortness of breath.  Denies palpitations.  Inpatient Medications    Scheduled Meds: . acetic acid  4 drop Both EARS QID  . baclofen  10 mg Oral TID  . benzonatate  100 mg Oral TID  . cholecalciferol  1,000 Units Oral Daily  . dextromethorphan-guaiFENesin  1 tablet Oral BID  . enoxaparin (LOVENOX) injection  1 mg/kg Subcutaneous Q12H  . ezetimibe  10 mg Oral Daily  . fluticasone  2 puff Inhalation BID  . gabapentin  300 mg Oral TID  . levalbuterol  1-2 puff Inhalation Q6H  . methylPREDNISolone (SOLU-MEDROL) injection  40 mg Intravenous Daily  . metoprolol tartrate  25 mg Oral BID  . mometasone-formoterol  2 puff Inhalation BID  . montelukast  10 mg Oral QPM  . multivitamin with minerals  1 tablet Oral Daily  . senna  1 tablet Oral BID   Continuous Infusions: . remdesivir 100 mg in NS 100 mL 100 mg (01/29/20 1043)   PRN Meds: acetaminophen, guaiFENesin-dextromethorphan, metoprolol tartrate, ondansetron **OR** ondansetron (ZOFRAN) IV, polyethylene glycol, traMADol   Vital Signs    Vitals:   01/29/20 0524 01/29/20 0644 01/29/20 0808 01/29/20 0846  BP:  110/65 109/75 108/75  Pulse:   84 89  Resp:   20 19  Temp:   98 F (36.7 C) 98.4 F (36.9 C)  TempSrc:   Oral Oral  SpO2:   99% 98%  Weight: 94.9 kg     Height:        Intake/Output Summary (Last 24 hours) at 01/29/2020 1136 Last data filed at 01/29/2020 0846 Gross per 24 hour  Intake 120 ml  Output 1500 ml  Net -1380 ml   Last 3 Weights 01/29/2020 01/28/2020 01/27/2020  Weight (lbs) 209 lb 3.5 oz 215 lb 8 oz 215 lb 3.2 oz  Weight (kg) 94.9 kg 97.75 kg 97.614 kg      Telemetry    Supraventricular rhythm, heart rate 93- Personally Reviewed  ECG    No new tracing- Personally Reviewed  Physical  Exam   GEN: No acute distress.   Neck: No JVD Cardiac: RRR, no murmurs, rubs, or gallops.  Respiratory:  Poor inspiratory effort GI: Soft, nontender, non-distended  MS: No edema; No deformity. Neuro:  Nonfocal  Psych: Normal affect   Labs    High Sensitivity Troponin:   Recent Labs  Lab 01/26/20 1130 01/26/20 1430 01/27/20 0643 01/27/20 1401  TROPONINIHS 7 60* 41* 25*      Chemistry Recent Labs  Lab 01/26/20 1130 01/27/20 0643 01/27/20 0701 01/28/20 0457 01/29/20 0641  NA   < >  --  140 138 140  K   < >  --  3.8 3.8 4.1  CL   < >  --  109 108 108  CO2   < >  --  20* 21* 22  GLUCOSE   < >  --  96 96 100*  BUN   < >  --  21 21 20   CREATININE   < >  --  1.02* 0.86 1.02*  CALCIUM   < >  --  8.4* 8.7* 8.9  PROT  --  5.8*  --  6.0* 5.6*  ALBUMIN  --  3.1*  --  3.4* 3.1*  AST  --  39  --  33 33  ALT  --  49*  --  47* 49*  ALKPHOS  --  44  --  44 45  BILITOT  --  1.3*  --  1.4* 1.2  GFRNONAA   < >  --  55* >60 55*  GFRAA   < >  --  >60 >60 >60  ANIONGAP   < >  --  11 9 10    < > = values in this interval not displayed.     Hematology Recent Labs  Lab 01/26/20 1430 01/28/20 0457 01/29/20 0641  WBC 5.6 6.4 7.4  RBC 4.58 4.50 4.47  HGB 14.0 14.3 13.8  HCT 40.5 40.1 41.2  MCV 88.4 89.1 92.2  MCH 30.6 31.8 30.9  MCHC 34.6 35.7 33.5  RDW 11.8 11.9 11.9  PLT 242 236 208    BNP Recent Labs  Lab 01/26/20 1131  BNP 95.2     DDimer No results for input(s): DDIMER in the last 168 hours.   Radiology    CT ANGIO CHEST PE W OR WO CONTRAST  Result Date: 01/28/2020 CLINICAL DATA:  Hypoxia.  COVID 19 positive. EXAM: CT ANGIOGRAPHY CHEST WITH CONTRAST TECHNIQUE: Multidetector CT imaging of the chest was performed using the standard protocol during bolus administration of intravenous contrast. Multiplanar CT image reconstructions and MIPs were obtained to evaluate the vascular anatomy. CONTRAST:  46mL OMNIPAQUE IOHEXOL 350 MG/ML SOLN COMPARISON:  01/26/2020 chest  radiograph. FINDINGS: Cardiovascular: Bilateral, lobar and segmental pulmonary emboli, greater right than left. Example 35/4 and 36/4. No right heart strain. There is reflux of contrast in the IVC, as can be seen with elevated right heart pressures. Normal aortic caliber, not well opacified. Tortuous thoracic aorta. Aortic atherosclerosis. Mild cardiomegaly. Mediastinum/Nodes: No mediastinal or hilar adenopathy. Lungs/Pleura: No pleural fluid. 6 mm left lower lobe pulmonary nodule on 44/6. Peripheral predominant airspace and ground-glass opacities bilaterally. Right base atelectasis adjacent to an elevated right hemidiaphragm. Upper Abdomen: Left hepatic lobe 1.5 cm cyst. Normal imaged portions of the spleen, stomach, pancreas, adrenal glands, left kidney. Musculoskeletal: Right shoulder arthroplasty. Multiple thoracic vertebral hemangiomas. Review of the MIP images confirms the above findings. IMPRESSION: 1. Bilateral pulmonary emboli, without right heart strain. 2. Peripheral predominant airspace and ground-glass opacities, consistent with COVID-19 pneumonia. 3. 6 mm left lower lobe pulmonary nodule. Non-contrast chest CT at 6-12 months is recommended. If the nodule is stable at time of repeat CT, then future CT at 18-24 months (from today's scan) is considered optional for low-risk patients, but is recommended for high-risk patients. This recommendation follows the consensus statement: Guidelines for Management of Incidental Pulmonary Nodules Detected on CT Images: From the Fleischner Society 2017; Radiology 2017; 284:228-243. 4. Aortic Atherosclerosis (ICD10-I70.0). These results will be called to the ordering clinician or representative by the Radiologist Assistant, and communication documented in the PACS or [employer]. Electronically Signed   By: Abigail Miyamoto M.D.   On: 01/28/2020 18:33    Cardiac Studies   TTE 12/2019 1. Left ventricular ejection fraction, by estimation, is 30 to 35%. The  left  ventricle has moderately decreased function. The left ventricle  demonstrates global hypokinesis. Left ventricular diastolic parameters are  indeterminate. The average left  ventricular global longitudinal strain is -9.6 %.  2. Right ventricular systolic function is normal. The right ventricular  size is normal. There is normal pulmonary artery systolic pressure.  3. The mitral valve is normal in structure. Mild to moderate mitral valve  regurgitation.  4. The aortic valve was not well visualized. Aortic valve regurgitation  is mild.  5. Ascending aorta 3.5 cm   Patient Profile     74 y.o. female with history of nonischemic cardiomyopathy, last EF 30 to 35%, paroxysmal SVT status post ablation, atrial tachycardia presenting to the hospital due to weakness and cough, diagnosed with COVID-19 pneumonia.  Hospital course complicated by bilateral PE.  Patient being seen due to tachycardia  Assessment & Plan    1.  Tachycardia -Heart rate currently controlled, 90 bpm -Continue Lopressor 25 mg twice daily. -Underlying illness Covid and/PE likely driving tachycardia.  2.  Nonischemic cardiomyopathy, EF 30 to 35%. -Currently euvolemic -Continue Lopressor for now.  Switch to long-acting Toprol-XL upon discharge. -Low normal blood pressures, acute illness, preventing addition of PTA losartan. -Losartan can be added when blood pressure improves.  3. Bilateral PE  -Dynamically stable -on therapeutic lovenox -switch to noac upon discharge  4.  COVID-19 pneumonia -Management as per primary team  Total encounter time 35 minutes  Greater than 50% was spent in counseling and coordination of care with the patient       Signed, Kate Sable, MD  01/29/2020, 11:36 AM

## 2020-01-29 NOTE — Progress Notes (Signed)
PROGRESS NOTE    Audrey Martinez  ENI:[id number] DOB: 1946/02/23 DOA: 01/26/2020 PCP: Glean Hess, MD     Brief Narrative:  74 year old WF PMHx  nonischemic cardiomyopathy, SVT, AVNRT, HLD, osteoporosis, CVA without residual deficit comes to the hospital with shortness of breath.  Found to have COVID-19 pneumonia. Currently plan is continue current treatment.    Subjective: A/O x4, positive S OB, negative CP, positive dry cough but feels that she has phlegm that she cannot bring up.  Patient states reason she did not get her Covid vaccination was because 1 sister obtain Covid vaccination came home died from PE.  Second sister obtained Covid vaccination was sitting at facility developed PE was able to be rescued.  (Genetic issue in the family?)   Assessment & Plan: Covid vaccination; not vaccinated   Active Problems:   Pneumonia due to COVID-19 virus   Tachycardia   Acute septic pulmonary embolism (HCC)   Acute respiratory failure with hypoxia (HCC)   Acute pulmonary embolus (HCC)   Left lower lobe pulmonary nodule   Acute respiratory failure with hypoxia/Covid pneumonia COVID-19 Labs  Recent Labs    01/27/20 0701 01/28/20 0457 01/29/20 0641 01/29/20 0956  FERRITIN  --   --   --  629*  LDH  --   --   --  245*  CRP 0.8 0.6 0.8  --     Lab Results  Component Value Date   SARSCOV2NAA POSITIVE (A) 01/26/2020   [place] NEGATIVE 04/29/2019   -Complete 5-day course Remdesivir -Solu-Medrol 40 mg BID -Vitamin C and zinc per Covid protocol -Incentive spirometer -Xopenex QID -Flutter valve -Titrate O2 to maintain SPO2> 88% -9/2 ambulatory perform SPO2  Acute bilateral PE without heart strain -Lovenox full dose per pharmacy -9/1 echocardiogram pending  LEFT lower lobe pulmonary nodule -Nodule does not meet the size concerning for malignancy (1 cm) however will need to be monitored per Fleischner criteria. -We will speak with patient to determine if  she is a high risk or low risk patient.  We will need to ensure patient follows up with pulmonologist at discharge to ensure she is not lost to follow-up.  Hx asthma -See Covid pneumonia  Atrial tachycardia/flutter -Patient has Hx PSVT and Hx A. Fib. -Metoprolol 5 mg PRN -Cardiology consult Dr. Rockey Situ  HLD -Lipid panel pending -Zetia 10 mg daily  Acute renal failure (baseline Cr 0.92-1.2) . Lab Results  Component Value Date   CREATININE 1.02 (H) 01/29/2020   CREATININE 0.86 01/28/2020   CREATININE 1.02 (H) 01/27/2020   CREATININE 1.66 (H) 01/26/2020   CREATININE 1.11 (H) 11/25/2019  -Back to baseline  Obesity (BMI 32.72 kg/m).  -Acutely nothing to be done    DVT prophylaxis: Lovenox per pharmacy Code Status: Full Family Communication:  Status is: Inpatient    Dispo: The patient is from: Home              Anticipated d/c is to: Home              Anticipated d/c date is: Next 29 to 72 hours              Patient currently unstable      Consultants:  Cardiology consult Dr. Rockey Situ    Procedures/Significant Events:  8/31 CT Angio PE protocol; Bilateral pulmonary emboli, without right heart strain. -Peripheral predominant airspace and ground-glass opacities,consistent with COVID-19 pneumonia. -6 mm left lower lobe pulmonary nodule. Non-contrast chest CT at 6-12 months is recommended. If the  nodule is stable at time of repeat CT, then future CT at 18-24 months (from today's scan) is considered optional for low-risk patients, but is recommended for high-risk patients. This recommendation follows the consensus statement: Guidelines for Management of Incidental Pulmonary Nodules Detected on CT Images: From the Fleischner Society 2017; Radiology  I have personally reviewed and interpreted all radiology studies and my findings are as above.  VENTILATOR SETTINGS: Nasal cannula 9/1 Flow; 2 L/min SPO2; 98%  Cultures   Antimicrobials: Anti-infectives (From  admission, onward)   Start     Ordered Stop   01/27/20 1000  remdesivir 100 mg in sodium chloride 0.9 % 100 mL IVPB       "Followed by" Linked Group Details   01/26/20 1346 01/31/20 0959   01/26/20 1530  remdesivir 200 mg in sodium chloride 0.9% 250 mL IVPB       "Followed by" Linked Group Details   01/26/20 1346 01/26/20 1656     Devices    LINES / TUBES:      Continuous Infusions:  remdesivir 100 mg in NS 100 mL 100 mg (01/29/20 1043)     Objective: Vitals:   01/29/20 0808 01/29/20 0846 01/29/20 1154 01/29/20 1559  BP: 109/75 108/75 99/71 100/61  Pulse: 84 89 84 87  Resp: 20 19 19 18   Temp: 98 F (36.7 C) 98.4 F (36.9 C) 98.2 F (36.8 C) 98.6 F (37 C)  TempSrc: Oral Oral Oral Oral  SpO2: 99% 98% 98% 93%  Weight:      Height:        Intake/Output Summary (Last 24 hours) at 01/29/2020 1725 Last data filed at 01/29/2020 0846 Gross per 24 hour  Intake 120 ml  Output 1500 ml  Net -1380 ml   Filed Weights   01/27/20 0337 01/28/20 0500 01/29/20 0524  Weight: 97.6 kg 97.8 kg 94.9 kg    Examination:  General: A/O x4, positive acute respiratory distress Eyes: negative scleral hemorrhage, negative anisocoria, negative icterus ENT: Negative Runny nose, negative gingival bleeding, Neck:  Negative scars, masses, torticollis, lymphadenopathy, JVD Lungs: positive diffuse rhonchi, positive wheezes, negative crackles Cardiovascular: Regular rate and rhythm without murmur gallop or rub normal S1 and S2 Abdomen: negative abdominal pain, nondistended, positive soft, bowel sounds, no rebound, no ascites, no appreciable mass Extremities: No significant cyanosis, clubbing, or edema bilateral lower extremities Skin: Negative rashes, lesions, ulcers Psychiatric:  Negative depression, negative anxiety, negative fatigue, negative mania  Central nervous system:  Cranial nerves II through XII intact, tongue/uvula midline, all extremities muscle strength 5/5, sensation intact  throughout, negative dysarthria, negative expressive aphasia, negative receptive aphasia.  .     Data Reviewed: Care during the described time interval was provided by me .  I have reviewed this patient's available data, including medical history, events of note, physical examination, and all test results as part of my evaluation.  CBC: Recent Labs  Lab 01/26/20 1130 01/26/20 1430 01/28/20 0457 01/29/20 0641  WBC 5.0 5.6 6.4 7.4  NEUTROABS 3.0  --   --   --   HGB 14.1 14.0 14.3 13.8  HCT 41.4 40.5 40.1 41.2  MCV 89.4 88.4 89.1 92.2  PLT 245 242 236 992   Basic Metabolic Panel: Recent Labs  Lab 01/26/20 1130 01/27/20 0701 01/28/20 0457 01/29/20 0641 01/29/20 0956  NA 137 140 138 140  --   K 3.9 3.8 3.8 4.1  --   CL 102 109 108 108  --   CO2 17* 20*  21* 22  --   GLUCOSE 113* 96 96 100*  --   BUN 26* 21 21 20   --   CREATININE 1.66* 1.02* 0.86 1.02*  --   CALCIUM 8.7* 8.4* 8.7* 8.9  --   MG 2.3  --  2.2  --  2.1  PHOS  --   --   --   --  3.5   GFR: Estimated Creatinine Clearance: 60.1 mL/min (A) (by C-G formula based on SCr of 1.02 mg/dL (H)). Liver Function Tests: Recent Labs  Lab 01/26/20 1130 01/27/20 0643 01/28/20 0457 01/29/20 0641  AST 54* 39 33 33  ALT 57* 49* 47* 49*  ALKPHOS 50 44 44 45  BILITOT 1.9* 1.3* 1.4* 1.2  PROT 6.8 5.8* 6.0* 5.6*  ALBUMIN 3.6 3.1* 3.4* 3.1*   No results for input(s): LIPASE, AMYLASE in the last 168 hours. No results for input(s): AMMONIA in the last 168 hours. Coagulation Profile: No results for input(s): INR, PROTIME in the last 168 hours. Cardiac Enzymes: No results for input(s): CKTOTAL, CKMB, CKMBINDEX, TROPONINI in the last 168 hours. BNP (last 3 results) No results for input(s): PROBNP in the last 8760 hours. HbA1C: No results for input(s): HGBA1C in the last 72 hours. CBG: No results for input(s): GLUCAP in the last 168 hours. Lipid Profile: No results for input(s): CHOL, HDL, LDLCALC, TRIG, CHOLHDL, LDLDIRECT  in the last 72 hours. Thyroid Function Tests: Recent Labs    01/27/20 1401  TSH 0.552   Anemia Panel: Recent Labs    01/29/20 0956  FERRITIN 629*   Sepsis Labs: No results for input(s): PROCALCITON, LATICACIDVEN in the last 168 hours.  Recent Results (from the past 240 hour(s))  SARS Coronavirus 2 by RT PCR (hospital order, performed in [facility] hospital lab) Nasopharyngeal Nasopharyngeal Swab     Status: Abnormal   Collection Time: 01/26/20 11:31 AM   Specimen: Nasopharyngeal Swab  Result Value Ref Range Status   SARS Coronavirus 2 POSITIVE (A) NEGATIVE Final    Comment: CRITICAL RESULT CALLED TO, READ BACK BY AND VERIFIED WITH: ANGELA [email or web address]@[email or web address]  ON 01/26/20 BY HKP (NOTE) SARS-CoV-2 target nucleic acids are DETECTED  SARS-CoV-2 RNA is generally detectable in upper respiratory specimens  during the acute phase of infection.  Positive results are indicative  of the presence of the identified virus, but do not rule out bacterial infection or co-infection with other pathogens not detected by the test.  Clinical correlation with patient history and  other diagnostic information is necessary to determine patient infection status.  The expected result is negative.  Fact Sheet for Patients:   StrictlyIdeas.no   Fact Sheet for Healthcare Providers:   BankingDealers.co.za    This test is not yet approved or cleared by the [place] FDA and  has been authorized for detection and/or diagnosis of SARS-CoV-2 by FDA under an Emergency Use Authorization (EUA).  This EUA will remain in effect (mean ing this test can be used) for the duration of  the COVID-19 declaration under Section 564(b)(1) of the Act, 21 U.S.C. section 360-bbb-3(b)(1), unless the authorization is terminated or revoked sooner.  Performed at [facility], [address], [place], [place] [address]          Radiology Studies: CT ANGIO CHEST PE  W OR WO CONTRAST  Result Date: 01/28/2020 CLINICAL DATA:  Hypoxia.  COVID 19 positive. EXAM: CT ANGIOGRAPHY CHEST WITH CONTRAST TECHNIQUE: Multidetector CT imaging of the chest was performed using the standard protocol during bolus  administration of intravenous contrast. Multiplanar CT image reconstructions and MIPs were obtained to evaluate the vascular anatomy. CONTRAST:  2mL OMNIPAQUE IOHEXOL 350 MG/ML SOLN COMPARISON:  01/26/2020 chest radiograph. FINDINGS: Cardiovascular: Bilateral, lobar and segmental pulmonary emboli, greater right than left. Example 35/4 and 36/4. No right heart strain. There is reflux of contrast in the IVC, as can be seen with elevated right heart pressures. Normal aortic caliber, not well opacified. Tortuous thoracic aorta. Aortic atherosclerosis. Mild cardiomegaly. Mediastinum/Nodes: No mediastinal or hilar adenopathy. Lungs/Pleura: No pleural fluid. 6 mm left lower lobe pulmonary nodule on 44/6. Peripheral predominant airspace and ground-glass opacities bilaterally. Right base atelectasis adjacent to an elevated right hemidiaphragm. Upper Abdomen: Left hepatic lobe 1.5 cm cyst. Normal imaged portions of the spleen, stomach, pancreas, adrenal glands, left kidney. Musculoskeletal: Right shoulder arthroplasty. Multiple thoracic vertebral hemangiomas. Review of the MIP images confirms the above findings. IMPRESSION: 1. Bilateral pulmonary emboli, without right heart strain. 2. Peripheral predominant airspace and ground-glass opacities, consistent with COVID-19 pneumonia. 3. 6 mm left lower lobe pulmonary nodule. Non-contrast chest CT at 6-12 months is recommended. If the nodule is stable at time of repeat CT, then future CT at 18-24 months (from today's scan) is considered optional for low-risk patients, but is recommended for high-risk patients. This recommendation follows the consensus statement: Guidelines for Management of Incidental Pulmonary Nodules Detected on CT Images: From the  Fleischner Society 2017; Radiology 2017; 284:228-243. 4. Aortic Atherosclerosis (ICD10-I70.0). These results will be called to the ordering clinician or representative by the Radiologist Assistant, and communication documented in the PACS or [employer]. Electronically Signed   By: Abigail Miyamoto M.D.   On: 01/28/2020 18:33        Scheduled Meds:  acetic acid  4 drop Both EARS QID   vitamin C  500 mg Oral Daily   baclofen  10 mg Oral TID   benzonatate  100 mg Oral TID   cholecalciferol  1,000 Units Oral Daily   dextromethorphan-guaiFENesin  1 tablet Oral BID   enoxaparin (LOVENOX) injection  1 mg/kg Subcutaneous Q12H   ezetimibe  10 mg Oral Daily   fluticasone  2 puff Inhalation BID   gabapentin  300 mg Oral TID   levalbuterol  1-2 puff Inhalation Q6H   methylPREDNISolone (SOLU-MEDROL) injection  40 mg Intravenous Q12H   metoprolol tartrate  25 mg Oral BID   mometasone-formoterol  2 puff Inhalation BID   montelukast  10 mg Oral QPM   multivitamin with minerals  1 tablet Oral Daily   senna  1 tablet Oral BID   zinc sulfate  220 mg Oral Daily   Continuous Infusions:  remdesivir 100 mg in NS 100 mL 100 mg (01/29/20 1043)     LOS: 3 days    Time spent:40 min    Salih Williamson, Geraldo Docker, MD [facility] Pager [phone]  If 7PM-7AM, please contact night-coverage [email or web address] Password [email or web address] 01/29/2020, 5:25 PM

## 2020-01-30 DIAGNOSIS — I2601 Septic pulmonary embolism with acute cor pulmonale: Secondary | ICD-10-CM

## 2020-01-30 DIAGNOSIS — E669 Obesity, unspecified: Secondary | ICD-10-CM

## 2020-01-30 DIAGNOSIS — I483 Typical atrial flutter: Secondary | ICD-10-CM

## 2020-01-30 DIAGNOSIS — E7800 Pure hypercholesterolemia, unspecified: Secondary | ICD-10-CM

## 2020-01-30 LAB — CBC WITH DIFFERENTIAL/PLATELET
Abs Immature Granulocytes: 0.17 10*3/uL — ABNORMAL HIGH (ref 0.00–0.07)
Basophils Absolute: 0 10*3/uL (ref 0.0–0.1)
Basophils Relative: 0 %
Eosinophils Absolute: 0 10*3/uL (ref 0.0–0.5)
Eosinophils Relative: 0 %
HCT: 40.6 % (ref 36.0–46.0)
Hemoglobin: 14.3 g/dL (ref 12.0–15.0)
Immature Granulocytes: 2 %
Lymphocytes Relative: 10 %
Lymphs Abs: 1 10*3/uL (ref 0.7–4.0)
MCH: 31 pg (ref 26.0–34.0)
MCHC: 35.2 g/dL (ref 30.0–36.0)
MCV: 88.1 fL (ref 80.0–100.0)
Monocytes Absolute: 0.3 10*3/uL (ref 0.1–1.0)
Monocytes Relative: 3 %
Neutro Abs: 8.6 10*3/uL — ABNORMAL HIGH (ref 1.7–7.7)
Neutrophils Relative %: 85 %
Platelets: 240 10*3/uL (ref 150–400)
RBC: 4.61 MIL/uL (ref 3.87–5.11)
RDW: 11.7 % (ref 11.5–15.5)
WBC: 10.2 10*3/uL (ref 4.0–10.5)
nRBC: 0 % (ref 0.0–0.2)

## 2020-01-30 LAB — FIBRIN DERIVATIVES D-DIMER (ARMC ONLY): Fibrin derivatives D-dimer (ARMC): >7500 ng/mL (FEU) — ABNORMAL HIGH (ref 0.00–499.00)

## 2020-01-30 LAB — MAGNESIUM: Magnesium: 2.3 mg/dL (ref 1.7–2.4)

## 2020-01-30 LAB — COMPREHENSIVE METABOLIC PANEL
ALT: 75 U/L — ABNORMAL HIGH (ref 0–44)
AST: 60 U/L — ABNORMAL HIGH (ref 15–41)
Albumin: 3.2 g/dL — ABNORMAL LOW (ref 3.5–5.0)
Alkaline Phosphatase: 56 U/L (ref 38–126)
Anion gap: 9 (ref 5–15)
BUN: 22 mg/dL (ref 8–23)
CO2: 22 mmol/L (ref 22–32)
Calcium: 8.9 mg/dL (ref 8.9–10.3)
Chloride: 107 mmol/L (ref 98–111)
Creatinine, Ser: 0.87 mg/dL (ref 0.44–1.00)
GFR calc Af Amer: >60 mL/min (ref >60)
GFR calc non Af Amer: >60 mL/min (ref >60)
Glucose, Bld: 171 mg/dL — ABNORMAL HIGH (ref 70–99)
Potassium: 4.3 mmol/L (ref 3.5–5.1)
Sodium: 138 mmol/L (ref 135–145)
Total Bilirubin: 1.1 mg/dL (ref 0.3–1.2)
Total Protein: 6 g/dL — ABNORMAL LOW (ref 6.5–8.1)

## 2020-01-30 LAB — PHOSPHORUS: Phosphorus: 3.4 mg/dL (ref 2.5–4.6)

## 2020-01-30 LAB — LIPID PANEL
Cholesterol: 113 mg/dL (ref 0–200)
HDL: 40 mg/dL — ABNORMAL LOW (ref >40)
LDL Cholesterol: 68 mg/dL (ref 0–99)
Total CHOL/HDL Ratio: 2.8 RATIO
Triglycerides: 27 mg/dL (ref <150)
VLDL: 5 mg/dL (ref 0–40)

## 2020-01-30 LAB — FERRITIN: Ferritin: 670 ng/mL — ABNORMAL HIGH (ref 11–307)

## 2020-01-30 LAB — C-REACTIVE PROTEIN: CRP: 0.9 mg/dL (ref <1.0)

## 2020-01-30 LAB — LACTATE DEHYDROGENASE: LDH: 211 U/L — ABNORMAL HIGH (ref 98–192)

## 2020-01-30 MED ORDER — RIVAROXABAN 15 MG PO TABS
15.0000 mg | ORAL_TABLET | Freq: Two times a day (BID) | ORAL | Status: DC
  Administered 2020-01-30 – 2020-02-01 (×4): 15 mg via ORAL
  Filled 2020-01-30 (×6): qty 1

## 2020-01-30 MED ORDER — RIVAROXABAN 20 MG PO TABS: 20.0000 mg | ORAL_TABLET | Freq: Every day | ORAL | Status: DC

## 2020-01-30 NOTE — Consult Note (Signed)
[place] for Therapeutic Enoxaparin Indication: pulmonary embolus  Allergies  Allergen Reactions  . Amoxicillin Hives, Shortness Of Breath and Swelling    Did it involve swelling of the face/tongue/throat, SOB, or low BP? Yes Did it involve sudden or severe rash/hives, skin peeling, or any reaction on the inside of your mouth or nose? Yes Did you need to seek medical attention at a hospital or doctor's office? Yes When did it last happen?1998 If all above answers are "NO", may proceed with cephalosporin use.   . Aspirin Anaphylaxis  . Ciprofloxacin Rash  . Ciprofloxacin-Dexamethasone Anaphylaxis  . Crestor [Rosuvastatin] Other (See Comments)    Caused problems with memory.  . Morphine And Related Nausea And Vomiting  . Doxycycline Hyclate Hives  . Hydrochlorothiazide Other (See Comments)    Raises her blood pressure.   Marland Kitchen Keflex [Cephalexin] Hives  . Lovastatin Other (See Comments)    "Unable to function"  . Simvastatin Other (See Comments)    "Unable to function"  . Erythromycin Hives  . Ibuprofen Other (See Comments)    Sharp stomach pains  . Zetia [Ezetimibe] Other (See Comments)    Blurry eyes, felt funny, memory problems.   . Neomycin-Bacitracin Zn-Polymyx Hives and Rash  . Omeprazole Palpitations    Tachycardia and same sensation as her SVTs per patient  . Tape Hives and Rash    Patient Measurements: Height: 5\' 8"  (172.7 cm) Weight: 94.9 kg (209 lb 3.5 oz) IBW/kg (Calculated) : 63.9  Vital Signs: Temp: 97.7 F (36.5 C) (09/02 1129) Temp Source: Oral (09/02 1129) BP: 116/90 (09/02 1129) Pulse Rate: 86 (09/02 1129)  Labs: Recent Labs    01/28/20 0457 01/28/20 0457 01/29/20 0641 01/30/20 0547  HGB 14.3   < > 13.8 14.3  HCT 40.1  --  41.2 40.6  PLT 236  --  208 240  CREATININE 0.86  --  1.02* 0.87   < > = values in this interval not displayed.    Estimated Creatinine Clearance: 70.4 mL/min (by C-G formula  based on SCr of 0.87 mg/dL).   Assessment: Patient is a 74 y/o F with medical history including NICM with EF 30 to 35% who is admitted with COVID-19 pneumonia. D-dimer elevated > 7500. CTA today with findings including bilateral pulmonary emboli, without right heart strain. Pharmacy has been consulted to start therapeutic enoxaparin for treatment of PE.  CBC stable    Plan:  --Enoxaparin 97.5 mg (1 mg/kg) q12h  --CBC per protocol  Benn Moulder, PharmD Pharmacy Resident  01/30/2020 2:48 PM

## 2020-01-30 NOTE — Progress Notes (Signed)
Progress Note  Patient Name: Audrey Martinez Date of Encounter: 01/30/2020  Primary Cardiologist: Rockey Situ  Subjective   No acute events overnight. Vitals stable. Labs pending this morning.   Inpatient Medications    Scheduled Meds: . acetic acid  4 drop Both EARS QID  . vitamin C  500 mg Oral Daily  . baclofen  10 mg Oral TID  . benzonatate  100 mg Oral TID  . cholecalciferol  1,000 Units Oral Daily  . dextromethorphan-guaiFENesin  1 tablet Oral BID  . enoxaparin (LOVENOX) injection  1 mg/kg Subcutaneous Q12H  . ezetimibe  10 mg Oral Daily  . fluticasone  2 puff Inhalation BID  . gabapentin  300 mg Oral TID  . levalbuterol  1-2 puff Inhalation Q6H  . methylPREDNISolone (SOLU-MEDROL) injection  40 mg Intravenous Q12H  . metoprolol tartrate  25 mg Oral BID  . mometasone-formoterol  2 puff Inhalation BID  . montelukast  10 mg Oral QPM  . multivitamin with minerals  1 tablet Oral Daily  . senna  1 tablet Oral BID  . zinc sulfate  220 mg Oral Daily   Continuous Infusions: . remdesivir 100 mg in NS 100 mL 100 mg (01/29/20 1043)   PRN Meds: acetaminophen, guaiFENesin-dextromethorphan, metoprolol tartrate, ondansetron **OR** ondansetron (ZOFRAN) IV, polyethylene glycol, traMADol   Vital Signs    Vitals:   01/29/20 1154 01/29/20 1559 01/29/20 2007 01/30/20 0454  BP: 99/71 100/61 113/68 120/79  Pulse: 84 87 100 80  Resp: 19 18 20 20   Temp: 98.2 F (36.8 C) 98.6 F (37 C) 98.8 F (37.1 C) 97.7 F (36.5 C)  TempSrc: Oral Oral Oral Oral  SpO2: 98% 93% 95% 94%  Weight:      Height:        Intake/Output Summary (Last 24 hours) at 01/30/2020 0743 Last data filed at 01/29/2020 0846 Gross per 24 hour  Intake --  Output 0 ml  Net 0 ml   Filed Weights   01/27/20 0337 01/28/20 0500 01/29/20 0524  Weight: 97.6 kg 97.8 kg 94.9 kg    Telemetry    SR, 80s to 90s bpm - Personally Reviewed  ECG    No new tracings - Personally Reviewed  Physical Exam   Please see  MD note.   Labs    Chemistry Recent Labs  Lab 01/26/20 1130 01/27/20 8341 01/27/20 0701 01/28/20 0457 01/29/20 0641  NA   < >  --  140 138 140  K   < >  --  3.8 3.8 4.1  CL   < >  --  109 108 108  CO2   < >  --  20* 21* 22  GLUCOSE   < >  --  96 96 100*  BUN   < >  --  21 21 20   CREATININE   < >  --  1.02* 0.86 1.02*  CALCIUM   < >  --  8.4* 8.7* 8.9  PROT  --  5.8*  --  6.0* 5.6*  ALBUMIN  --  3.1*  --  3.4* 3.1*  AST  --  39  --  33 33  ALT  --  49*  --  47* 49*  ALKPHOS  --  44  --  44 45  BILITOT  --  1.3*  --  1.4* 1.2  GFRNONAA   < >  --  55* >60 55*  GFRAA   < >  --  >60 >60 >60  ANIONGAP   < >  --  11 9 10    < > = values in this interval not displayed.     Hematology Recent Labs  Lab 01/28/20 0457 01/29/20 0641 01/30/20 0547  WBC 6.4 7.4 10.2  RBC 4.50 4.47 4.61  HGB 14.3 13.8 14.3  HCT 40.1 41.2 40.6  MCV 89.1 92.2 88.1  MCH 31.8 30.9 31.0  MCHC 35.7 33.5 35.2  RDW 11.9 11.9 11.7  PLT 236 208 240    Cardiac EnzymesNo results for input(s): TROPONINI in the last 168 hours. No results for input(s): TROPIPOC in the last 168 hours.   BNP Recent Labs  Lab 01/26/20 1131  BNP 95.2     DDimer No results for input(s): DDIMER in the last 168 hours.   Radiology    CT ANGIO CHEST PE W OR WO CONTRAST  Result Date: 01/28/2020 IMPRESSION: 1. Bilateral pulmonary emboli, without right heart strain. 2. Peripheral predominant airspace and ground-glass opacities, consistent with COVID-19 pneumonia. 3. 6 mm left lower lobe pulmonary nodule. Non-contrast chest CT at 6-12 months is recommended. If the nodule is stable at time of repeat CT, then future CT at 18-24 months (from today's scan) is considered optional for low-risk patients, but is recommended for high-risk patients. This recommendation follows the consensus statement: Guidelines for Management of Incidental Pulmonary Nodules Detected on CT Images: From the Fleischner Society 2017; Radiology 2017;  284:228-243. 4. Aortic Atherosclerosis (ICD10-I70.0). These results will be called to the ordering clinician or representative by the Radiologist Assistant, and communication documented in the PACS or [employer]. Electronically Signed   By: Abigail Miyamoto M.D.   On: 01/28/2020 18:33    Cardiac Studies   2D echo 01/09/2020: 1. Left ventricular ejection fraction, by estimation, is 30 to 35%. The  left ventricle has moderately decreased function. The left ventricle  demonstrates global hypokinesis. Left ventricular diastolic parameters are  indeterminate. The average left  ventricular global longitudinal strain is -9.6 %.  2. Right ventricular systolic function is normal. The right ventricular  size is normal. There is normal pulmonary artery systolic pressure.  3. The mitral valve is normal in structure. Mild to moderate mitral valve  regurgitation.  4. The aortic valve was not well visualized. Aortic valve regurgitation  is mild.  5. Ascending aorta 3.5 cm  Patient Profile     74 y.o. female with history of HFrEF secondary to NICM, SVT/atrial tachycardia status post ablation x2, possible atrial flutter with patient declining anticoagulation in the past, TIA, recent right-sided vision loss/disturbance felt to be suspected amaurosis fugax in the ED, hypertension, hyperlipidemia, asthma, migraine disorder, and GERD admitted with GURKY-70 pneumonia complicated by PE who we are seeing for sinus tachycardia.   Assessment & Plan    1. Sinus tachycardia: -Improved -In the setting of COVID-19 pneumonia and PE -Remains on Lopressor  2. HFrEF secondary to NICM: -Euvolemic and well compensated -Continue Lopressor for now -Switch to long-acting Toprol-XL upon discharge given cardiomyopathy  -Low normal blood pressures, acute illness, preventing addition of PTA losartan -Losartan vs Entresto can be added when blood pressure improves -Add spironolactone as/if able  3. Bilateral PE    -Hemodynamically stable -On therapeutic lovenox -Transition to [place] upon discharge -Follow up with PCP  4.  COVID-19 pneumonia -Management as per primary team   For questions or updates, please contact [facility] Please consult [email or web address] for contact info under Cardiology/STEMI.    Signed, Christell Faith, PA-C [place] Pager: [phone] 01/30/2020,  7:43 AM

## 2020-01-30 NOTE — Progress Notes (Signed)
ANTICOAGULATION CONSULT NOTE - Initial Consult  Pharmacy Consult for Xarelto  Indication: pulmonary embolus  Allergies  Allergen Reactions  . Amoxicillin Hives, Shortness Of Breath and Swelling    Did it involve swelling of the face/tongue/throat, SOB, or low BP? Yes Did it involve sudden or severe rash/hives, skin peeling, or any reaction on the inside of your mouth or nose? Yes Did you need to seek medical attention at a hospital or doctor's office? Yes When did it last happen?1998 If all above answers are "NO", may proceed with cephalosporin use.   . Aspirin Anaphylaxis  . Ciprofloxacin Rash  . Ciprofloxacin-Dexamethasone Anaphylaxis  . Crestor [Rosuvastatin] Other (See Comments)    Caused problems with memory.  . Morphine And Related Nausea And Vomiting  . Doxycycline Hyclate Hives  . Hydrochlorothiazide Other (See Comments)    Raises her blood pressure.   Marland Kitchen Keflex [Cephalexin] Hives  . Lovastatin Other (See Comments)    "Unable to function"  . Simvastatin Other (See Comments)    "Unable to function"  . Erythromycin Hives  . Ibuprofen Other (See Comments)    Sharp stomach pains  . Zetia [Ezetimibe] Other (See Comments)    Blurry eyes, felt funny, memory problems.   . Neomycin-Bacitracin Zn-Polymyx Hives and Rash  . Omeprazole Palpitations    Tachycardia and same sensation as her SVTs per patient  . Tape Hives and Rash    Patient Measurements: Height: 5\' 8"  (172.7 cm) Weight: 94.9 kg (209 lb 3.5 oz) IBW/kg (Calculated) : 63.9 Heparin Dosing Weight:   Vital Signs: Temp: 98.2 F (36.8 C) (09/02 2007) Temp Source: Oral (09/02 2007) BP: 106/67 (09/02 2007) Pulse Rate: 86 (09/02 2007)  Labs: Recent Labs    01/28/20 0457 01/28/20 0457 01/29/20 0641 01/30/20 0547  HGB 14.3   < > 13.8 14.3  HCT 40.1  --  41.2 40.6  PLT 236  --  208 240  CREATININE 0.86  --  1.02* 0.87   < > = values in this interval not displayed.    Estimated Creatinine Clearance:  70.4 mL/min (by C-G formula based on SCr of 0.87 mg/dL).   Medical History: Past Medical History:  Diagnosis Date  . Actinic keratosis   . Amaurosis fugax, right eye 12/2018  . Arthritis   . Asthma   . Basal cell carcinoma 07/2016   central forehead (tx in [place])  . Cardiomyopathy ([place])    a. 04/2018 Echo: EF 45-50%. Anteroseptal and apical HK in some views. Mild AI/MR. Nl RV fxn; b. 05/2018 MV: mid-dist ant and antsept mild defect - ? breast atten. EF 41%. No ischemia; c. 03/2019 Echo: EF 30-35%, Gr2 DD, Trace MR, triv TR. Asc Ao ectatic dil - 65mm.   Marland Kitchen GERD (gastroesophageal reflux disease)   . Herniated disc, cervical   . Hyperlipidemia   . Left wrist fracture   . Osteoporosis   . Stroke ([place]) 1998  . SVT (supraventricular tachycardia) ([place])    a.2008 s/p RFCA for AVNRT; b. 9 & 02/2019 Zio x 2: 1. Avg HR 89, 3 runs NSVT (longest 7 beats), [place] 1. 2. Avg HR 89, NSVT x 1 (5 beats), [place] 1. No signif arrhythmia.    Medications:  Medications Prior to Admission  Medication Sig Dispense Refill Last Dose  . albuterol (PROVENTIL HFA;VENTOLIN HFA) 108 (90 Base) MCG/ACT inhaler Inhale 1-2 puffs into the lungs every 6 (six) hours as needed for wheezing or shortness of breath.    Past Week at prn  .  B Complex-C (B-COMPLEX WITH VITAMIN C) tablet Take 1 tablet by mouth daily.   01/25/2020 at Unknown time  . baclofen (LIORESAL) 10 MG tablet TAKE 1 TABLET BY MOUTH THREE TIMES A DAY (Patient taking differently: Take 10 mg by mouth 3 (three) times daily. ) 270 tablet 0 01/25/2020 at Unknown time  . Bioflavonoid Products (ESTER-C) TABS Take 1 tablet by mouth 2 (two) times daily.    01/25/2020 at Unknown time  . Calcium Carb-Cholecalciferol (CALCIUM 1000 + D PO) Take 1 tablet by mouth daily.   01/25/2020 at Unknown time  . carvedilol (COREG) 3.125 MG tablet TAKE 1 TABLET (3.125 MG TOTAL) BY MOUTH 2 (TWO) TIMES DAILY WITH A MEAL. 180 tablet 1 01/25/2020 at Unknown time  . cholecalciferol (VITAMIN  D) 25 MCG (1000 UT) tablet Take 1,000 Units by mouth daily.   01/25/2020 at Unknown time  . Cranberry 1000 MG CAPS Take 1,000 mg by mouth daily.    01/25/2020 at Unknown time  . diltiazem (CARDIZEM) 30 MG tablet Take 1 tablet (30 mg) by mouth three times a day as needed for fast heart rates 90 tablet 1 01/25/2020 at Unknown time  . ezetimibe (ZETIA) 10 MG tablet TAKE 1 TABLET BY MOUTH EVERY DAY (Patient taking differently: Take 10 mg by mouth daily. ) 90 tablet 3 01/25/2020 at Unknown time  . fluticasone (FLOVENT HFA) 110 MCG/ACT inhaler TAKE 2 PUFFS BY MOUTH TWICE A DAY (Patient taking differently: Inhale 2 puffs into the lungs 2 (two) times daily. ) 36 Inhaler 6 01/25/2020 at Unknown time  . fluticasone (FLOVENT HFA) 220 MCG/ACT inhaler Inhale into the lungs 2 (two) times daily.   01/25/2020 at Unknown time  . gabapentin (NEURONTIN) 300 MG capsule TAKE 1 CAPSULE (300 MG TOTAL) BY MOUTH 3 (THREE) TIMES DAILY. THREE CAPSULES DAILY AT BEDTIME. (Patient taking differently: Take 300 mg by mouth 3 (three) times daily as needed (nerve pain.). ) 270 capsule 3 01/25/2020 at Unknown time  . Ginger, Zingiber officinalis, (GINGER PO) Take 1,100 mg by mouth daily.   01/25/2020 at Unknown time  . losartan (COZAAR) 25 MG tablet Take 0.5 tablets (12.5 mg total) by mouth daily. 45 tablet 3 01/25/2020 at Unknown time  . MAG ASPART-POTASSIUM ASPART PO Take 1 tablet by mouth 2 (two) times daily. 600 mg-198 mg   01/25/2020 at Unknown time  . Misc Natural Products (TART CHERRY ADVANCED PO) Take 1 capsule by mouth daily.    01/25/2020 at Unknown time  . montelukast (SINGULAIR) 10 MG tablet Take 10 mg by mouth every evening.   01/25/2020 at Unknown time  . Multiple Vitamins-Minerals (MULTIVITAMIN WITH MINERALS) tablet Take 1 tablet by mouth daily. Centrum Silver   01/25/2020 at Unknown time  . Olodaterol HCl (STRIVERDI RESPIMAT) 2.5 MCG/ACT AERS Inhale 2 puffs into the lungs daily. USE AT THE SAME TIME EVERYDAY 12 g 2 01/25/2020 at  Unknown time  . Probiotic Product (PROBIOTIC DAILY PO) Take 1 capsule by mouth 2 (two) times a week.    01/25/2020 at Unknown time  . Turmeric 500 MG CAPS Take 500 mg by mouth daily.    01/25/2020 at Unknown time    Assessment: Pharmacy consulted to dose xarelto for PE in this 74 year old female admitted with COVID PNA.   CrCl = 70.4 ml/min Pt was on lovenox 97.5 mg SQ Q12H , last dose was 9/2 @ 1000.   Goal of Therapy:  Resolution of PE   Plan:  Will d/c lovenox .  Will order Xarelto 15 mg PO BID to start 9/2 @ 2200 X 42 doses followed by xarelto 20 mg PO daily.  Tamar Miano D 01/30/2020,9:34 PM

## 2020-01-30 NOTE — Progress Notes (Addendum)
PROGRESS NOTE    Audrey Martinez  GUR:[id number] DOB: [birth year] DOA: 01/26/2020 PCP: Glean Hess, MD     Brief Narrative:  74 year old WF PMHx  nonischemic cardiomyopathy, SVT, AVNRT, HLD, osteoporosis, CVA without residual deficit comes to the hospital with shortness of breath.  Found to have COVID-19 pneumonia. Currently plan is continue current treatment.    Subjective: 9/2 afebrile overnight A/O x4, positive S OB, presyncopal when she ambulated.   Assessment & Plan: Covid vaccination; not vaccinated   Active Problems:   Pneumonia due to COVID-19 virus   Tachycardia   Acute septic pulmonary embolism (HCC)   Acute respiratory failure with hypoxia (HCC)   Acute pulmonary embolus (HCC)   Left lower lobe pulmonary nodule   Pulmonary embolus (HCC)   Atrial flutter (HCC)   Hypotension   HLD (hyperlipidemia)   Acute renal failure (ARF) (HCC)   Obesity (BMI 30-39.9)   Acute respiratory failure with hypoxia/Covid pneumonia COVID-19 Labs  Recent Labs    01/29/20 0641 01/29/20 0956 01/30/20 0547 01/31/20 0543  FERRITIN  --  629* 670* 639*  LDH  --  245* 211* 251*  CRP 0.8  --  0.9  --     Lab Results  Component Value Date   SARSCOV2NAA POSITIVE (A) 01/26/2020   SARSCOV2NAA NEGATIVE 04/29/2019   -Complete 5-day course Remdesivir -Solu-Medrol 40 mg BID -Vitamin C and zinc per Covid protocol -Incentive spirometer -Xopenex QID -Flutter valve -Titrate O2 to maintain SPO2> 88% -9/2 ambulatory perform SPO2  Acute bilateral PE without heart strain -Lovenox full dose per pharmacy -9/1 echocardiogram pending -9/2 Xarelto per pharmacy  LEFT lower lobe pulmonary nodule -Nodule does not meet the size concerning for malignancy (1 cm) however will need to be monitored per Fleischner criteria. -We will speak with patient to determine if she is a high risk or low risk patient.  We will need to ensure patient follows up with pulmonologist at discharge to  ensure she is not lost to follow-up.  Hx asthma -See Covid pneumonia  Atrial tachycardia/flutter -Patient has Hx PSVT and Hx A. Fib. -Metoprolol 5 mg PRN -Cardiology consult Dr. Rockey Situ -9/2 DC metoprolol per cardiology -9/2 Toprol 12.5 mg daily  Hypotension -9/2 midodrine 2.5 mg TID  HLD -Lipid panel pending -Zetia 10 mg daily  Acute renal failure (baseline Cr 0.92-1.2) . Lab Results  Component Value Date   CREATININE 0.93 01/31/2020   CREATININE 0.87 01/30/2020   CREATININE 1.02 (H) 01/29/2020   CREATININE 0.86 01/28/2020   CREATININE 1.02 (H) 01/27/2020  -Back to baseline  Obesity (BMI 32.72 kg/m).  -Acutely nothing to be done    DVT prophylaxis: Lovenox per pharmacy---> Xarelto Code Status: Full Family Communication:  Status is: Inpatient    Dispo: The patient is from: Home              Anticipated d/c is to: Home              Anticipated d/c date is: Next 2 to 72 hours              Patient currently unstable      Consultants:  Cardiology consult Dr. Rockey Situ    Procedures/Significant Events:  8/31 CT Angio PE protocol; Bilateral pulmonary emboli, without right heart strain. -Peripheral predominant airspace and ground-glass opacities,consistent with COVID-19 pneumonia. -6 mm left lower lobe pulmonary nodule. Non-contrast chest CT at 6-12 months is recommended. If the nodule is stable at time of repeat CT, then future CT at  18-24 months (from today's scan) is considered optional for low-risk patients, but is recommended for high-risk patients. This recommendation follows the consensus statement: Guidelines for Management of Incidental Pulmonary Nodules Detected on CT Images: From the Fleischner Society 2017; Radiology  I have personally reviewed and interpreted all radiology studies and my findings are as above.  VENTILATOR SETTINGS: Room air 9/2 Flow;  SPO2; 91%  Cultures   Antimicrobials: Anti-infectives (From admission, onward)    Start     Ordered Stop   01/27/20 1000  remdesivir 100 mg in sodium chloride 0.9 % 100 mL IVPB       "Followed by" Linked Group Details   01/26/20 1346 01/31/20 0959   01/26/20 1530  remdesivir 200 mg in sodium chloride 0.9% 250 mL IVPB       "Followed by" Linked Group Details   01/26/20 1346 01/26/20 1656     Devices    LINES / TUBES:      Continuous Infusions:    Objective: Vitals:   01/30/20 2007 01/31/20 0403 01/31/20 0622 01/31/20 0759  BP: 106/67 109/78 95/60 94/67   Pulse: 86 (!) 120 (!) 127 (!) 129  Resp: 18 18 18 18   Temp: 98.2 F (36.8 C) 97.6 F (36.4 C)  (!) 97.5 F (36.4 C)  TempSrc: Oral Oral  Oral  SpO2: 97% 91% 93% 92%  Weight:  101.8 kg    Height:       No intake or output data in the 24 hours ending 01/31/20 0829 Filed Weights   01/28/20 0500 01/29/20 0524 01/31/20 0403  Weight: 97.8 kg 94.9 kg 101.8 kg    Examination:  General: A/O x4, positive acute respiratory distress Eyes: negative scleral hemorrhage, negative anisocoria, negative icterus ENT: Negative Runny nose, negative gingival bleeding, Neck:  Negative scars, masses, torticollis, lymphadenopathy, JVD Lungs: positive diffuse rhonchi, positive wheezes, negative crackles Cardiovascular: Regular rate and rhythm without murmur gallop or rub normal S1 and S2 Abdomen: negative abdominal pain, nondistended, positive soft, bowel sounds, no rebound, no ascites, no appreciable mass Extremities: No significant cyanosis, clubbing, or edema bilateral lower extremities Skin: Negative rashes, lesions, ulcers Psychiatric:  Negative depression, negative anxiety, negative fatigue, negative mania  Central nervous system:  Cranial nerves II through XII intact, tongue/uvula midline, all extremities muscle strength 5/5, sensation intact throughout, negative dysarthria, negative expressive aphasia, negative receptive aphasia.  .     Data Reviewed: Care during the described time interval was provided by  me .  I have reviewed this patient's available data, including medical history, events of note, physical examination, and all test results as part of my evaluation.  CBC: Recent Labs  Lab 01/26/20 1130 01/26/20 1130 01/26/20 1430 01/28/20 0457 01/29/20 0641 01/30/20 0547 01/31/20 0543  WBC 5.0   < > 5.6 6.4 7.4 10.2 16.9*  NEUTROABS 3.0  --   --   --   --  8.6* 14.5*  HGB 14.1   < > 14.0 14.3 13.8 14.3 14.9  HCT 41.4   < > 40.5 40.1 41.2 40.6 43.4  MCV 89.4   < > 88.4 89.1 92.2 88.1 90.6  PLT 245   < > 242 236 208 240 302   < > = values in this interval not displayed.   Basic Metabolic Panel: Recent Labs  Lab 01/26/20 1130 01/26/20 1130 01/27/20 0701 01/28/20 0457 01/29/20 0641 01/29/20 0956 01/30/20 0547 01/31/20 0543  NA 137   < > 140 138 140  --  138 141  K 3.9   < >  3.8 3.8 4.1  --  4.3 5.0  CL 102   < > 109 108 108  --  107 112*  CO2 17*   < > 20* 21* 22  --  22 19*  GLUCOSE 113*   < > 96 96 100*  --  171* 165*  BUN 26*   < > 21 21 20   --  22 23  CREATININE 1.66*   < > 1.02* 0.86 1.02*  --  0.87 0.93  CALCIUM 8.7*   < > 8.4* 8.7* 8.9  --  8.9 9.1  MG 2.3  --   --  2.2  --  2.1 2.3 2.6*  PHOS  --   --   --   --   --  3.5 3.4 3.9   < > = values in this interval not displayed.   GFR: Estimated Creatinine Clearance: 68.3 mL/min (by C-G formula based on SCr of 0.93 mg/dL). Liver Function Tests: Recent Labs  Lab 01/27/20 [phone] 01/28/20 0457 01/29/20 0641 01/30/20 0547 01/31/20 0543  AST 39 33 33 60* 56*  ALT 49* 47* 49* 75* 83*  ALKPHOS 44 44 45 56 60  BILITOT 1.3* 1.4* 1.2 1.1 0.8  PROT 5.8* 6.0* 5.6* 6.0* 5.9*  ALBUMIN 3.1* 3.4* 3.1* 3.2* 3.3*   No results for input(s): LIPASE, AMYLASE in the last 168 hours. No results for input(s): AMMONIA in the last 168 hours. Coagulation Profile: No results for input(s): INR, PROTIME in the last 168 hours. Cardiac Enzymes: No results for input(s): CKTOTAL, CKMB, CKMBINDEX, TROPONINI in the last 168 hours. BNP  (last 3 results) No results for input(s): PROBNP in the last 8760 hours. HbA1C: No results for input(s): HGBA1C in the last 72 hours. CBG: No results for input(s): GLUCAP in the last 168 hours. Lipid Profile: Recent Labs    01/30/20 0547  CHOL 113  HDL 40*  LDLCALC 68  TRIG 27  CHOLHDL 2.8   Thyroid Function Tests: No results for input(s): TSH, T4TOTAL, FREET4, T3FREE, THYROIDAB in the last 72 hours. Anemia Panel: Recent Labs    01/30/20 0547 01/31/20 0543  FERRITIN 670* 639*   Sepsis Labs: No results for input(s): PROCALCITON, LATICACIDVEN in the last 168 hours.  Recent Results (from the past 240 hour(s))  SARS Coronavirus 2 by RT PCR (hospital order, performed in [facility] hospital lab) Nasopharyngeal Nasopharyngeal Swab     Status: Abnormal   Collection Time: 01/26/20 11:31 AM   Specimen: Nasopharyngeal Swab  Result Value Ref Range Status   SARS Coronavirus 2 POSITIVE (A) NEGATIVE Final    Comment: CRITICAL RESULT CALLED TO, READ BACK BY AND VERIFIED WITH: ANGELA [email or web address]  ON 01/26/20 BY [facility]KP (NOTE) SARS-CoV-2 target nucleic acids are DETECTED  SARS-CoV-2 RNA is generally detectable in upper respiratory specimens  during the acute phase of infection.  Positive results are indicative  of the presence of the identified virus, but do not rule out bacterial infection or co-infection with other pathogens not detected by the test.  Clinical correlation with patient history and  other diagnostic information is necessary to determine patient infection status.  The expected result is negative.  Fact Sheet for Patients:   StrictlyIdeas.no   Fact Sheet for Healthcare Providers:   BankingDealers.co.za    This test is not yet approved or cleared by the [place] FDA and  has been authorized for detection and/or diagnosis of SARS-CoV-2 by FDA under an Emergency Use Authorization (EUA).  This EUA will remain in effect  (  mean ing this test can be used) for the duration of  the COVID-19 declaration under Section 564(b)(1) of the Act, 21 U.S.C. section 360-bbb-3(b)(1), unless the authorization is terminated or revoked sooner.  Performed at [facility], [address] [place], [place] [address]          Radiology Studies: No results found.      Scheduled Meds: . acetic acid  4 drop Both EARS QID  . vitamin C  500 mg Oral Daily  . baclofen  10 mg Oral TID  . benzonatate  100 mg Oral TID  . cholecalciferol  1,000 Units Oral Daily  . dextromethorphan-guaiFENesin  1 tablet Oral BID  . ezetimibe  10 mg Oral Daily  . fluticasone  2 puff Inhalation BID  . gabapentin  300 mg Oral TID  . levalbuterol  1-2 puff Inhalation Q6H  . methylPREDNISolone (SOLU-MEDROL) injection  40 mg Intravenous Q12H  . metoprolol succinate  12.5 mg Oral Daily  . midodrine  2.5 mg Oral TID WC  . mometasone-formoterol  2 puff Inhalation BID  . montelukast  10 mg Oral QPM  . multivitamin with minerals  1 tablet Oral Daily  . Rivaroxaban  15 mg Oral BID WC   Followed by  . [START ON 02/20/2020] rivaroxaban  20 mg Oral Q supper  . senna  1 tablet Oral BID  . zinc sulfate  220 mg Oral Daily   Continuous Infusions:    LOS: 5 days    Time spent:40 min    Quavion Boule, Geraldo Docker, MD [facility] Pager [phone]  If 7PM-7AM, please contact night-coverage [email or web address] Password [email or web address] 01/31/2020, 8:29 AM

## 2020-01-31 DIAGNOSIS — E669 Obesity, unspecified: Secondary | ICD-10-CM | POA: Diagnosis present

## 2020-01-31 DIAGNOSIS — R0602 Shortness of breath: Secondary | ICD-10-CM

## 2020-01-31 DIAGNOSIS — I4892 Unspecified atrial flutter: Secondary | ICD-10-CM | POA: Diagnosis present

## 2020-01-31 DIAGNOSIS — J9601 Acute respiratory failure with hypoxia: Secondary | ICD-10-CM

## 2020-01-31 DIAGNOSIS — I2699 Other pulmonary embolism without acute cor pulmonale: Secondary | ICD-10-CM

## 2020-01-31 DIAGNOSIS — N179 Acute kidney failure, unspecified: Secondary | ICD-10-CM | POA: Diagnosis present

## 2020-01-31 HISTORY — DX: Other pulmonary embolism without acute cor pulmonale: I26.99

## 2020-01-31 HISTORY — DX: Acute kidney failure, unspecified: N17.9

## 2020-01-31 LAB — C-REACTIVE PROTEIN: CRP: 0.8 mg/dL (ref <1.0)

## 2020-01-31 LAB — CBC WITH DIFFERENTIAL/PLATELET
Abs Immature Granulocytes: 0.35 10*3/uL — ABNORMAL HIGH (ref 0.00–0.07)
Basophils Absolute: 0 10*3/uL (ref 0.0–0.1)
Basophils Relative: 0 %
Eosinophils Absolute: 0 10*3/uL (ref 0.0–0.5)
Eosinophils Relative: 0 %
HCT: 43.4 % (ref 36.0–46.0)
Hemoglobin: 14.9 g/dL (ref 12.0–15.0)
Immature Granulocytes: 2 %
Lymphocytes Relative: 7 %
Lymphs Abs: 1.2 10*3/uL (ref 0.7–4.0)
MCH: 31.1 pg (ref 26.0–34.0)
MCHC: 34.3 g/dL (ref 30.0–36.0)
MCV: 90.6 fL (ref 80.0–100.0)
Monocytes Absolute: 0.9 10*3/uL (ref 0.1–1.0)
Monocytes Relative: 5 %
Neutro Abs: 14.5 10*3/uL — ABNORMAL HIGH (ref 1.7–7.7)
Neutrophils Relative %: 86 %
Platelets: 302 10*3/uL (ref 150–400)
RBC: 4.79 MIL/uL (ref 3.87–5.11)
RDW: 11.9 % (ref 11.5–15.5)
WBC: 16.9 10*3/uL — ABNORMAL HIGH (ref 4.0–10.5)
nRBC: 0 % (ref 0.0–0.2)

## 2020-01-31 LAB — FERRITIN: Ferritin: 639 ng/mL — ABNORMAL HIGH (ref 11–307)

## 2020-01-31 LAB — COMPREHENSIVE METABOLIC PANEL
ALT: 83 U/L — ABNORMAL HIGH (ref 0–44)
AST: 56 U/L — ABNORMAL HIGH (ref 15–41)
Albumin: 3.3 g/dL — ABNORMAL LOW (ref 3.5–5.0)
Alkaline Phosphatase: 60 U/L (ref 38–126)
Anion gap: 10 (ref 5–15)
BUN: 23 mg/dL (ref 8–23)
CO2: 19 mmol/L — ABNORMAL LOW (ref 22–32)
Calcium: 9.1 mg/dL (ref 8.9–10.3)
Chloride: 112 mmol/L — ABNORMAL HIGH (ref 98–111)
Creatinine, Ser: 0.93 mg/dL (ref 0.44–1.00)
GFR calc Af Amer: >60 mL/min (ref >60)
GFR calc non Af Amer: >60 mL/min (ref >60)
Glucose, Bld: 165 mg/dL — ABNORMAL HIGH (ref 70–99)
Potassium: 5 mmol/L (ref 3.5–5.1)
Sodium: 141 mmol/L (ref 135–145)
Total Bilirubin: 0.8 mg/dL (ref 0.3–1.2)
Total Protein: 5.9 g/dL — ABNORMAL LOW (ref 6.5–8.1)

## 2020-01-31 LAB — PHOSPHORUS: Phosphorus: 3.9 mg/dL (ref 2.5–4.6)

## 2020-01-31 LAB — FIBRIN DERIVATIVES D-DIMER (ARMC ONLY): Fibrin derivatives D-dimer (ARMC): 6814.42 ng/mL (FEU) — ABNORMAL HIGH (ref 0.00–499.00)

## 2020-01-31 LAB — MAGNESIUM: Magnesium: 2.6 mg/dL — ABNORMAL HIGH (ref 1.7–2.4)

## 2020-01-31 LAB — LACTATE DEHYDROGENASE: LDH: 251 U/L — ABNORMAL HIGH (ref 98–192)

## 2020-01-31 MED ORDER — MIDODRINE HCL 5 MG PO TABS
2.5000 mg | ORAL_TABLET | Freq: Three times a day (TID) | ORAL | Status: DC
  Administered 2020-01-31 – 2020-02-01 (×5): 2.5 mg via ORAL
  Filled 2020-01-31 (×5): qty 1

## 2020-01-31 MED ORDER — METOPROLOL SUCCINATE ER 25 MG PO TB24: 12.5000 mg | ORAL_TABLET | Freq: Every day | ORAL | Status: DC

## 2020-01-31 MED ORDER — METOPROLOL TARTRATE 25 MG PO TABS
25.0000 mg | ORAL_TABLET | Freq: Two times a day (BID) | ORAL | Status: DC
  Administered 2020-01-31 – 2020-02-01 (×3): 25 mg via ORAL
  Filled 2020-01-31 (×3): qty 1

## 2020-01-31 NOTE — Discharge Instructions (Signed)
Information on my medicine - XARELTO (rivaroxaban)WHY WAS [place]? Xarelto was prescribed to treat blood clots that may have been found in the veins of your legs (deep vein thrombosis) or in your lungs (pulmonary embolism) and to reduce the risk of them occurring again. WHAT DO YOU NEED TO KNOW ABOUT XARELTO? The starting dose is one 15 mg tablet taken TWICE daily with food for the FIRST 21 DAYS then on ____September 23____the dose is changed to one 20 mg tablet taken ONCE A DAY with your evening meal. DO NOT stop taking Xarelto without talking to the health care provider who prescribed the medication. Refill your prescription for 20 mg tablets before you run out. After discharge, you should have regular check-up appointments with your healthcare provider that is prescribing your Xarelto. In the future your dose may need to be changed if your kidney function changes by a significant amount. WHAT DO YOU DO IF YOU MISS A DOSE? If you are takingXareltoTWICE DAILY and you miss a dose, take it as soon as you remember. You may take two 15 mg tablets (total 30 mg) at same time then resume your regularly scheduled 15 mg twice daily the next day. If you are taking Xarelto ONCE DAILY and you miss a dose, take it as soon as you remember on the same day then continue your regularly scheduled once daily regimen the next day. Do not take two doses of Xarelto at the same time. IMPORTANT SAFETY INFORMATION Xarelto is a blood thinner medicine that can cause bleeding. You should call your healthcare provider right away if you experience any of the following: ?   Bleeding from an injury or your nose that does not stop.?   Unusual colored urine (red or dark brown) or unusual colored stools (red or black).?   Unusual bruising for unknown reasons.?   A serious fall or if you hit your head (even if there is no bleeding).Some medicines may interact with Xarelto and might increase your risk of bleeding while on  Xarelto. To help avoid this, consult your healthcare provider or pharmacist prior to using any new prescription or non-prescription medications, including herbals, vitamins, non-steroidal anti-inflammatory drugs (NSAIDs) and supplements. This medication education was reviewed with me or my healthcare partner as part of my discharge preparation. This website has more information on Xarelto: [email or web address]

## 2020-01-31 NOTE — Care Management Important Message (Signed)
Important Message  Patient Details  Name: Audrey Martinez MRN: [id number] Date of Birth: [birth year]   Medicare Important Message Given:  Other (see comment)  Attempted to review Medicare IM, however no answer on room phone.     Dannette Barbara 01/31/2020, 2:43 PM

## 2020-01-31 NOTE — Progress Notes (Signed)
Patient ambulated in the room multiple times around the bed on room air. Patient was 93% at rest and then after ambulating, the lowest it came down to was 89% but recovered quickly and oxygen came back up to 92%. Patient stated she felt good and experienced some mild shortness of breath.

## 2020-01-31 NOTE — Progress Notes (Signed)
Progress Note  Patient Name: Audrey Martinez Date of Encounter: 01/31/2020  [facility] Cardiologist: Ida Rogue, MD   Subjective   Elevated heart rate this morning, was better controlled yesterday Has not received her metoprolol Rates in the 120s She does report that she noticed heart rate was running higher Reports that she tried to ambulate yesterday, did not feel very well, did not go very far She is not sure that she is ready to go home as she does not feel as good  Inpatient Medications    Scheduled Meds:  acetic acid  4 drop Both EARS QID   vitamin C  500 mg Oral Daily   baclofen  10 mg Oral TID   benzonatate  100 mg Oral TID   cholecalciferol  1,000 Units Oral Daily   dextromethorphan-guaiFENesin  1 tablet Oral BID   ezetimibe  10 mg Oral Daily   fluticasone  2 puff Inhalation BID   gabapentin  300 mg Oral TID   levalbuterol  1-2 puff Inhalation Q6H   methylPREDNISolone (SOLU-MEDROL) injection  40 mg Intravenous Q12H   metoprolol tartrate  25 mg Oral BID   midodrine  2.5 mg Oral TID WC   mometasone-formoterol  2 puff Inhalation BID   montelukast  10 mg Oral QPM   multivitamin with minerals  1 tablet Oral Daily   Rivaroxaban  15 mg Oral BID WC   Followed by   Derrill Memo ON 02/20/2020] rivaroxaban  20 mg Oral Q supper   senna  1 tablet Oral BID   zinc sulfate  220 mg Oral Daily   Continuous Infusions:  PRN Meds: acetaminophen, guaiFENesin-dextromethorphan, metoprolol tartrate, ondansetron **OR** ondansetron (ZOFRAN) IV, polyethylene glycol, traMADol   Vital Signs    Vitals:   01/31/20 0403 01/31/20 0622 01/31/20 0759 01/31/20 1217  BP: 109/78 95/60 94/67  103/74  Pulse: (!) 120 (!) 127 (!) 129 (!) 117  Resp: 18 18 18 18   Temp: 97.6 F (36.4 C)  (!) 97.5 F (36.4 C) (!) 97.5 F (36.4 C)  TempSrc: Oral  Oral Oral  SpO2: 91% 93% 92% 93%  Weight: 101.8 kg     Height:       No intake or output data in the 24 hours ending  01/31/20 1231 Last 3 Weights 01/31/2020 01/29/2020 01/28/2020  Weight (lbs) 224 lb 8 oz 209 lb 3.5 oz 215 lb 8 oz  Weight (kg) 101.833 kg 94.9 kg 97.75 kg      Telemetry    Sinus tachycardia rate 120- Personally Reviewed  ECG     - Personally Reviewed   Labs    High Sensitivity Troponin:   Recent Labs  Lab 01/26/20 1130 01/26/20 1430 01/27/20 0643 01/27/20 1401  TROPONINIHS 7 60* 41* 25*      Chemistry Recent Labs  Lab 01/29/20 0641 01/30/20 0547 01/31/20 0543  NA 140 138 141  K 4.1 4.3 5.0  CL 108 107 112*  CO2 22 22 19*  GLUCOSE 100* 171* 165*  BUN 20 22 23   CREATININE 1.02* 0.87 0.93  CALCIUM 8.9 8.9 9.1  PROT 5.6* 6.0* 5.9*  ALBUMIN 3.1* 3.2* 3.3*  AST 33 60* 56*  ALT 49* 75* 83*  ALKPHOS 45 56 60  BILITOT 1.2 1.1 0.8  GFRNONAA 55* >60 >60  GFRAA >60 >60 >60  ANIONGAP 10 9 10      Hematology Recent Labs  Lab 01/29/20 0641 01/30/20 0547 01/31/20 0543  WBC 7.4 10.2 16.9*  RBC 4.47 4.61 4.79  HGB 13.8 14.3 14.9  HCT 41.2 40.6 43.4  MCV 92.2 88.1 90.6  MCH 30.9 31.0 31.1  MCHC 33.5 35.2 34.3  RDW 11.9 11.7 11.9  PLT 208 240 302    BNP Recent Labs  Lab 01/26/20 1131  BNP 95.2     DDimer No results for input(s): DDIMER in the last 168 hours.   Radiology    No results found.  Cardiac Studies   Echocardiogram 1. Left ventricular ejection fraction, by estimation, is 30 to 35%. The  left ventricle has moderately decreased function. The left ventricle  demonstrates global hypokinesis. Left ventricular diastolic parameters are  indeterminate. The average left  ventricular global longitudinal strain is -9.6 %.  2. Right ventricular systolic function is normal. The right ventricular  size is normal. There is normal pulmonary artery systolic pressure.  3. The mitral valve is normal in structure. Mild to moderate mitral valve  regurgitation.  4. The aortic valve was not well visualized. Aortic valve regurgitation  is mild.  5.  Ascending aorta 3.5 cm  Patient Profile   74 year old female with history of nonischemic cardiomyopathy with an EF of 30 to 35%, paroxysmal SVTs status post catheter ablation twice, recurrent atrial tachycardia, essential hypertension and multiple other comorbidities listed.  We are being asked to see for intermittent tachycardia in the setting of COVID-19 pneumonia. She was admitted yesterday with weakness, cough, headache and poor appetite.  She tested positive for COVID-19.  She contracted illness from her son.  She was noted to have intermittent tachycardia which was treated with diltiazem drip.  She was started on treatment for COVID-19.   Assessment & Plan    covid 19/respiratory distress/PNA On IV remdesivir, bronchodilators, steroids -Recovery will be slow in the setting of bilateral PE  Sinus tachycardia Through most of her hospital course Seems to wax and wane, Etiology secondary to Covid and underlying respiratory distress, bilateral PE in the setting of nonischemic cardiomyopathy ejection fraction 30 to 35%  had better rate control on metoprolol tartrate versus carvedilol We will continue metoprolol tartrate 25 twice daily despite low blood pressure she seems to tolerate this well -In general she is relatively asymptomatic As lung function improves, rate should improve  Cardiomyopathy, nonischemic Recent echocardiogram ejection fraction 30 to 35%, chronic High risk of complications in the setting of above  SVT/atrial tachycardia Has not had significant SVT or atrial tachycardia Predominant has been sinus tachycardia driven by pulmonary pathology  Bilateral PE No right heart strain noted on CT scan On Xarelto 15 twice daily for 21 days then down to 20 daily  Long discussion with her concerning atrial for main issues including Covid, bilateral PE, etiology of PE, cardiomyopathy, tachycardia and management  Total encounter time more than 35 minutes  Greater than 50% was  spent in counseling and coordination of care with the patient   For questions or updates, please contact [facility] Please consult [email or web address] for contact info under        Signed, Ida Rogue, MD  01/31/2020, 12:31 PM

## 2020-01-31 NOTE — TOC Initial Note (Signed)
Transition of Care [facility]) - Initial/Assessment Note    Patient Details  Name: Audrey Martinez MRN: [id number] Date of Birth: Feb 05, 1946  [facility] of [facility]) CM/SW Contact:    Shelbie Hutching, RN Phone Number: 01/31/2020, 9:08 AM  Clinical Narrative:                 Patient admitted to the hospital with [place].  RNCM was able to speak with patient via phone.  RNCM explained role in discharge planning.  Patient is from home where she lives alone and is independent at home and drives.  Patient does use a cane and keeps one in her car.  Patient is current with her PCP and cardiologist.  Patient has no issues obtaining her prescription medications.  Patient would like to have home health services at discharge and chooses [place].  Erin with [facility] accepted referral for RN, PT, OT and aide.  Patient's son lives close to her and can help out once she is discharged.   TOC team will cont to follow for needs.  [facility] can see the patient as soon as Saturday if she discharges on 9/3.   Expected Discharge Plan: [place] Barriers to Discharge: Continued Medical Work up   Patient Goals and CMS Choice Patient states their goals for this hospitalization and ongoing recovery are:: would like to go home with home health services CMS Medicare.gov Compare Post Acute Care list provided to:: Patient Choice offered to / list presented to : Patient  Expected Discharge Plan and Services Expected Discharge Plan: [place]   Discharge Planning Services: CM Consult Post Acute Care Choice: [place] arrangements for the past 2 months: Single Family Home                           HH Arranged: RN, PT, OT, Nurse's Aide [place] Agency: [place] ([place]) Date HH Agency Contacted: 01/31/20 Time [place]: [phone] Representative spoke with at [place]: Kingsley Spittle  Prior Living Arrangements/Services Living arrangements for  the past 2 months: [place] with:: Self Patient language and need for interpreter reviewed:: Yes Do you feel safe going back to the place where you live?: Yes      Need for Family Participation in Patient Care: Yes (Comment) (COVID) Care giver support system in place?: Yes (comment) (son) Current home services: DME (cane) Criminal Activity/Legal Involvement Pertinent to Current Situation/Hospitalization: No - Comment as needed  Activities of Daily Living Home Assistive Devices/Equipment: Walker (specify type) ADL Screening (condition at time of admission) Patient's cognitive ability adequate to safely complete daily activities?: No Is the patient deaf or have difficulty hearing?: No Does the patient have difficulty seeing, even when wearing glasses/contacts?: No Does the patient have difficulty concentrating, remembering, or making decisions?: No Patient able to express need for assistance with ADLs?: Yes Does the patient have difficulty dressing or bathing?: No Independently performs ADLs?: Yes (appropriate for developmental age) Does the patient have difficulty walking or climbing stairs?: No Weakness of Legs: Both Weakness of Arms/Hands: None  Permission Sought/Granted Permission sought to share information with : Case Manager, Family Supports, Other (comment) Permission granted to share information with : Yes, Verbal Permission Granted  Share Information with NAME: Micha  Permission granted to share info w AGENCY: [place] granted to share info w Relationship: son     Emotional Assessment  Attitude/Demeanor/Rapport: Engaged Affect (typically observed): Accepting Orientation: : Oriented to Self, Oriented to Place, Oriented to  Time, Oriented to Situation Alcohol / Substance Use: Not Applicable Psych Involvement: No (comment)  Admission diagnosis:  Palpitations [R00.2] Shortness of breath [R06.02] Tachycardia [R00.0] AKI (acute  kidney injury) ([place]) [N17.9] Atrial flutter by electrocardiogram ([place]) [I48.92] Pneumonia due to COVID-19 virus [U07.1, J12.82] COVID-19 [U07.1] Patient Active Problem List   Diagnosis Date Noted  . Pulmonary embolus ([place]) 01/31/2020  . Atrial flutter ([place]) 01/31/2020  . Hypotension 01/31/2020  . HLD (hyperlipidemia) 01/31/2020  . Acute renal failure (ARF) ([place]) 01/31/2020  . Obesity (BMI 30-39.9) 01/31/2020  . Acute respiratory failure with hypoxia ([place]) 01/29/2020  . Acute pulmonary embolus ([place]) 01/29/2020  . Left lower lobe pulmonary nodule 01/29/2020  . Tachycardia   . Acute septic pulmonary embolism ([place])   . Pneumonia due to COVID-19 virus 01/26/2020  . Atrial flutter by electrocardiogram ([place])   . Shortness of breath   . AKI (acute kidney injury) ([place])   . Chronic obstructive pulmonary disease, unspecified COPD type ([place]) 10/08/2019  . Dilated cardiomyopathy ([place]) 05/02/2019  . Amaurosis fugax of right eye 01/13/2019  . Colon cancer screening   . Arm pain, right 11/08/2017  . Renal insufficiency 11/08/2017  . Tendinitis of right knee 07/03/2017  . Lumbar radiculopathy 05/18/2017  . Status post hemilaminotomy 05/18/2017  . Lumbar degenerative disc disease 05/18/2017  . Spinal stenosis of lumbar region without neurogenic claudication 05/18/2017  . Atrial tachycardia ([place]) 04/14/2017  . SVT (supraventricular tachycardia) ([name]) 04/14/2017  . Essential hypertension 04/14/2017  . Intermittent asthma without complication 81/27/5170  . Osteoporosis 03/21/2017  . Benign essential tremor 03/21/2017  . Obesity 09/14/2011  . Hyperlipidemia 05/12/2003  . Personal history of transient ischemic attack (TIA), and cerebral infarction without residual deficits 04/22/2003   PCP:  Glean Hess, MD Pharmacy:   [id number] - [place], [place] [address] Phone: [phone] Fax: [phone]     Social Determinants of Health (SDOH)  Interventions    Readmission Risk Interventions No flowsheet data found.

## 2020-01-31 NOTE — Progress Notes (Signed)
PROGRESS NOTE    Audrey Martinez  SEG:[id number] DOB: 12-Aug-1945 DOA: 01/26/2020 PCP: Glean Hess, MD     Brief Narrative:  74 year old WF PMHx  nonischemic cardiomyopathy, SVT, AVNRT, HLD, osteoporosis, CVA without residual deficit comes to the hospital with shortness of breath.  Found to have COVID-19 pneumonia. Currently plan is continue current treatment.    Subjective: 9/3 afebrile overnight A/O x4, negative S OB, negative CP, negative abdominal pain.   Assessment & Plan: Covid vaccination; not vaccinated   Active Problems:   Pneumonia due to COVID-19 virus   Tachycardia   Acute septic pulmonary embolism (HCC)   Acute respiratory failure with hypoxia (HCC)   Acute pulmonary embolus (HCC)   Left lower lobe pulmonary nodule   Pulmonary embolus (HCC)   Atrial flutter (HCC)   Hypotension   HLD (hyperlipidemia)   Acute renal failure (ARF) (HCC)   Obesity (BMI 30-39.9)   Acute respiratory failure with hypoxia/Covid pneumonia COVID-19 Labs  Recent Labs    01/29/20 0641 01/29/20 0956 01/30/20 0547 01/31/20 0543  FERRITIN  --  629* 670* 639*  LDH  --  245* 211* 251*  CRP 0.8  --  0.9  --     Lab Results  Component Value Date   SARSCOV2NAA POSITIVE (A) 01/26/2020   SARSCOV2NAA NEGATIVE 04/29/2019   -Complete 5-day course Remdesivir -Solu-Medrol 40 mg BID -Vitamin C and zinc per Covid protocol -Incentive spirometer -Xopenex QID -Flutter valve -Titrate O2 to maintain SPO2> 88% -9/2 Patient ambulated in the room multiple times around the bed on room air. Patient was 93% at rest and then after ambulating, the lowest it came down to was 89% but recovered quickly and oxygen came back up to 92%. Patient stated she felt good and experienced some mild shortness of breath. -Patient does not meet criteria for home O2.  Acute bilateral PE without heart strain -Lovenox full dose per pharmacy -9/1 echocardiogram pending -9/2 Xarelto per pharmacy  LEFT  lower lobe pulmonary nodule -Nodule does not meet the size concerning for malignancy (1 cm) however will need to be monitored per Fleischner criteria. -We will speak with patient to determine if she is a high risk or low risk patient.  We will need to ensure patient follows up with pulmonologist at discharge to ensure she is not lost to follow-up.  Hx asthma -See Covid pneumonia  Atrial tachycardia/flutter -Patient has Hx PSVT and Hx A. Fib. -Metoprolol 5 mg PRN -Cardiology consult Dr. Rockey Situ -9/2 DC metoprolol per cardiology -9/2 Toprol 12.5 mg daily  Hypotension -9/2 midodrine 2.5 mg TID -9/3 knee-high TED hose. 12 hours on during the day; 12 hours off at night  HLD -Lipid panel pending -Zetia 10 mg daily  Acute renal failure (baseline Cr 0.92-1.2) . Lab Results  Component Value Date   CREATININE 0.93 01/31/2020   CREATININE 0.87 01/30/2020   CREATININE 1.02 (H) 01/29/2020   CREATININE 0.86 01/28/2020   CREATININE 1.02 (H) 01/27/2020  -Back to baseline  Obesity (BMI 32.72 kg/m).  -Acutely nothing to be done  Elevated liver enzymes    DVT prophylaxis: Lovenox per pharmacy---> Xarelto Code Status: Full Family Communication:  Status is: Inpatient    Dispo: The patient is from: Home              Anticipated d/c is to: Home              Anticipated d/c date is: Next 48 to 72 hours  Patient currently unstable      Consultants:  Cardiology consult Dr. Rockey Situ    Procedures/Significant Events:  8/31 CT Angio PE protocol; Bilateral pulmonary emboli, without right heart strain. -Peripheral predominant airspace and ground-glass opacities,consistent with COVID-19 pneumonia. -6 mm left lower lobe pulmonary nodule. Non-contrast chest CT at 6-12 months is recommended. If the nodule is stable at time of repeat CT, then future CT at 18-24 months (from today's scan) is considered optional for low-risk patients, but is recommended for high-risk patients.  This recommendation follows the consensus statement: Guidelines for Management of Incidental Pulmonary Nodules Detected on CT Images: From the Fleischner Society 2017; Radiology  I have personally reviewed and interpreted all radiology studies and my findings are as above.  VENTILATOR SETTINGS: Room air 9/3 Flow;  SPO2; 92%  Cultures   Antimicrobials: Anti-infectives (From admission, onward)   Start     Ordered Stop   01/27/20 1000  remdesivir 100 mg in sodium chloride 0.9 % 100 mL IVPB       "Followed by" Linked Group Details   01/26/20 1346 01/31/20 0959   01/26/20 1530  remdesivir 200 mg in sodium chloride 0.9% 250 mL IVPB       "Followed by" Linked Group Details   01/26/20 1346 01/26/20 1656     Devices    LINES / TUBES:      Continuous Infusions:    Objective: Vitals:   01/30/20 2007 01/31/20 0403 01/31/20 0622 01/31/20 0759  BP: 106/67 109/78 95/60 94/67   Pulse: 86 (!) 120 (!) 127 (!) 129  Resp: 18 18 18 18   Temp: 98.2 F (36.8 C) 97.6 F (36.4 C)  (!) 97.5 F (36.4 C)  TempSrc: Oral Oral  Oral  SpO2: 97% 91% 93% 92%  Weight:  101.8 kg    Height:       No intake or output data in the 24 hours ending 01/31/20 0833 Filed Weights   01/28/20 0500 01/29/20 0524 01/31/20 0403  Weight: 97.8 kg 94.9 kg 101.8 kg    Examination:  General: A/O x4, positive acute respiratory distress Eyes: negative scleral hemorrhage, negative anisocoria, negative icterus ENT: Negative Runny nose, negative gingival bleeding, Neck:  Negative scars, masses, torticollis, lymphadenopathy, JVD Lungs: positive diffuse rhonchi, positive wheezes, negative crackles Cardiovascular: Regular rate and rhythm without murmur gallop or rub normal S1 and S2 Abdomen: negative abdominal pain, nondistended, positive soft, bowel sounds, no rebound, no ascites, no appreciable mass Extremities: No significant cyanosis, clubbing, or edema bilateral lower extremities Skin: Negative rashes,  lesions, ulcers Psychiatric:  Negative depression, negative anxiety, negative fatigue, negative mania  Central nervous system:  Cranial nerves II through XII intact, tongue/uvula midline, all extremities muscle strength 5/5, sensation intact throughout, negative dysarthria, negative expressive aphasia, negative receptive aphasia.  .     Data Reviewed: Care during the described time interval was provided by me .  I have reviewed this patient's available data, including medical history, events of note, physical examination, and all test results as part of my evaluation.  CBC: Recent Labs  Lab 01/26/20 1130 01/26/20 1130 01/26/20 1430 01/28/20 0457 01/29/20 0641 01/30/20 0547 01/31/20 0543  WBC 5.0   < > 5.6 6.4 7.4 10.2 16.9*  NEUTROABS 3.0  --   --   --   --  8.6* 14.5*  HGB 14.1   < > 14.0 14.3 13.8 14.3 14.9  HCT 41.4   < > 40.5 40.1 41.2 40.6 43.4  MCV 89.4   < > 88.4  89.1 92.2 88.1 90.6  PLT 245   < > 242 236 208 240 302   < > = values in this interval not displayed.   Basic Metabolic Panel: Recent Labs  Lab 01/26/20 1130 01/26/20 1130 01/27/20 0701 01/28/20 0457 01/29/20 0641 01/29/20 0956 01/30/20 0547 01/31/20 0543  NA 137   < > 140 138 140  --  138 141  K 3.9   < > 3.8 3.8 4.1  --  4.3 5.0  CL 102   < > 109 108 108  --  107 112*  CO2 17*   < > 20* 21* 22  --  22 19*  GLUCOSE 113*   < > 96 96 100*  --  171* 165*  BUN 26*   < > 21 21 20   --  22 23  CREATININE 1.66*   < > 1.02* 0.86 1.02*  --  0.87 0.93  CALCIUM 8.7*   < > 8.4* 8.7* 8.9  --  8.9 9.1  MG 2.3  --   --  2.2  --  2.1 2.3 2.6*  PHOS  --   --   --   --   --  3.5 3.4 3.9   < > = values in this interval not displayed.   GFR: Estimated Creatinine Clearance: 68.3 mL/min (by C-G formula based on SCr of 0.93 mg/dL). Liver Function Tests: Recent Labs  Lab 01/27/20 [phone] 01/28/20 0457 01/29/20 0641 01/30/20 0547 01/31/20 0543  AST 39 33 33 60* 56*  ALT 49* 47* 49* 75* 83*  ALKPHOS 44 44 45 56 60    BILITOT 1.3* 1.4* 1.2 1.1 0.8  PROT 5.8* 6.0* 5.6* 6.0* 5.9*  ALBUMIN 3.1* 3.4* 3.1* 3.2* 3.3*   No results for input(s): LIPASE, AMYLASE in the last 168 hours. No results for input(s): AMMONIA in the last 168 hours. Coagulation Profile: No results for input(s): INR, PROTIME in the last 168 hours. Cardiac Enzymes: No results for input(s): CKTOTAL, CKMB, CKMBINDEX, TROPONINI in the last 168 hours. BNP (last 3 results) No results for input(s): PROBNP in the last 8760 hours. HbA1C: No results for input(s): HGBA1C in the last 72 hours. CBG: No results for input(s): GLUCAP in the last 168 hours. Lipid Profile: Recent Labs    01/30/20 0547  CHOL 113  HDL 40*  LDLCALC 68  TRIG 27  CHOLHDL 2.8   Thyroid Function Tests: No results for input(s): TSH, T4TOTAL, FREET4, T3FREE, THYROIDAB in the last 72 hours. Anemia Panel: Recent Labs    01/30/20 0547 01/31/20 0543  FERRITIN 670* 639*   Sepsis Labs: No results for input(s): PROCALCITON, LATICACIDVEN in the last 168 hours.  Recent Results (from the past 240 hour(s))  SARS Coronavirus 2 by RT PCR (hospital order, performed in [facility] hospital lab) Nasopharyngeal Nasopharyngeal Swab     Status: Abnormal   Collection Time: 01/26/20 11:31 AM   Specimen: Nasopharyngeal Swab  Result Value Ref Range Status   SARS Coronavirus 2 POSITIVE (A) NEGATIVE Final    Comment: CRITICAL RESULT CALLED TO, READ BACK BY AND VERIFIED WITH: ANGELA [email or web address]  ON 01/26/20 BY [facility]KP (NOTE) SARS-CoV-2 target nucleic acids are DETECTED  SARS-CoV-2 RNA is generally detectable in upper respiratory specimens  during the acute phase of infection.  Positive results are indicative  of the presence of the identified virus, but do not rule out bacterial infection or co-infection with other pathogens not detected by the test.  Clinical correlation with patient history and  other diagnostic  information is necessary to determine patient infection status.  The  expected result is negative.  Fact Sheet for Patients:   StrictlyIdeas.no   Fact Sheet for Healthcare Providers:   BankingDealers.co.za    This test is not yet approved or cleared by the [place] FDA and  has been authorized for detection and/or diagnosis of SARS-CoV-2 by FDA under an Emergency Use Authorization (EUA).  This EUA will remain in effect (mean ing this test can be used) for the duration of  the COVID-19 declaration under Section 564(b)(1) of the Act, 21 U.S.C. section 360-bbb-3(b)(1), unless the authorization is terminated or revoked sooner.  Performed at [facility], [address] [place], [place] [address]          Radiology Studies: No results found.      Scheduled Meds: . acetic acid  4 drop Both EARS QID  . vitamin C  500 mg Oral Daily  . baclofen  10 mg Oral TID  . benzonatate  100 mg Oral TID  . cholecalciferol  1,000 Units Oral Daily  . dextromethorphan-guaiFENesin  1 tablet Oral BID  . ezetimibe  10 mg Oral Daily  . fluticasone  2 puff Inhalation BID  . gabapentin  300 mg Oral TID  . levalbuterol  1-2 puff Inhalation Q6H  . methylPREDNISolone (SOLU-MEDROL) injection  40 mg Intravenous Q12H  . metoprolol succinate  12.5 mg Oral Daily  . midodrine  2.5 mg Oral TID WC  . mometasone-formoterol  2 puff Inhalation BID  . montelukast  10 mg Oral QPM  . multivitamin with minerals  1 tablet Oral Daily  . Rivaroxaban  15 mg Oral BID WC   Followed by  . [START ON 02/20/2020] rivaroxaban  20 mg Oral Q supper  . senna  1 tablet Oral BID  . zinc sulfate  220 mg Oral Daily   Continuous Infusions:    LOS: 5 days    Time spent:40 min    Rubens Cranston, Geraldo Docker, MD [facility] Pager [phone]  If 7PM-7AM, please contact night-coverage [email or web address] Password [facility] 01/31/2020, 8:33 AM

## 2020-01-31 NOTE — Progress Notes (Signed)
   01/31/20 0622  Assess: MEWS Score  BP 95/60  Pulse Rate (!) 127  Resp 18  SpO2 93 %  O2 Device Room Air  Assess: MEWS Score  MEWS Temp 0  MEWS Systolic 1  MEWS Pulse 2  MEWS RR 0  MEWS LOC 0  MEWS Score 3  MEWS Score Color Yellow  Assess: if the MEWS score is Yellow or Red  Were vital signs taken at a resting state? Yes  Focused Assessment No change from prior assessment  Early Detection of Sepsis Score *See Row Information* Medium  MEWS guidelines implemented *See Row Information* Yes  Treat  MEWS Interventions Escalated (See documentation below)  Escalate  MEWS: Escalate Yellow: discuss with charge nurse/RN and consider discussing with provider and RRT  Notify: Provider  Provider Name/Title Ouma NP  Date Provider Notified 01/31/20  Time Provider Notified [phone]  Notification Type Page  Notification Reason Other (Comment) (HR unchanged after medication)  Document  Patient Outcome Other (Comment) (waiting for provider response)  Progress note created (see row info) Yes

## 2020-01-31 NOTE — Progress Notes (Signed)
Pharmacy Counseling on Xarelto  Unable to counsel pt on Xarelto as pt was unavailable to answer call. The following information was included in discharge instructions for pt.      Information on my medicine - XARELTO (rivaroxaban)WHY WAS [place]? Xarelto was prescribed to treat blood clots that may have been found in the veins of your legs (deep vein thrombosis) or in your lungs (pulmonary embolism) and to reduce the risk of them occurring again. WHAT DO YOU NEED TO KNOW ABOUT XARELTO? The starting dose is one 15 mg tablet taken TWICE daily with food for the FIRST 21 DAYS then on ____September 23____the dose is changed to one 20 mg tablet taken ONCE A DAY with your evening meal. DO NOT stop taking Xarelto without talking to the health care provider who prescribed the medication. Refill your prescription for 20 mg tablets before you run out. After discharge, you should have regular check-up appointments with your healthcare provider that is prescribing your Xarelto. In the future your dose may need to be changed if your kidney function changes by a significant amount. WHAT DO YOU DO IF YOU MISS A DOSE? If you are takingXareltoTWICE DAILY and you miss a dose, take it as soon as you remember. You may take two 15 mg tablets (total 30 mg) at same time then resume your regularly scheduled 15 mg twice daily the next day. If you are taking Xarelto ONCE DAILY and you miss a dose, take it as soon as you remember on the same day then continue your regularly scheduled once daily regimen the next day. Do not take two doses of Xarelto at the same time. IMPORTANT SAFETY INFORMATION Xarelto is a blood thinner medicine that can cause bleeding. You should call your healthcare provider right away if you experience any of the following: ?   Bleeding from an injury or your nose that does not stop.?   Unusual colored urine (red or dark brown) or unusual colored stools (red or black).?   Unusual  bruising for unknown reasons.?   A serious fall or if you hit your head (even if there is no bleeding).Some medicines may interact with Xarelto and might increase your risk of bleeding while on Xarelto. To help avoid this, consult your healthcare provider or pharmacist prior to using any new prescription or non-prescription medications, including herbals, vitamins, non-steroidal anti-inflammatory drugs (NSAIDs) and supplements. This medication education was reviewed with me or my healthcare partner as part of my discharge preparation. This website has more information on Xarelto: [email or web address]       Benn Moulder, PharmD Pharmacy Resident  01/31/2020 1:27 PM

## 2020-02-01 LAB — C-REACTIVE PROTEIN: CRP: <0.5 mg/dL (ref <1.0)

## 2020-02-01 LAB — CBC WITH DIFFERENTIAL/PLATELET
Abs Immature Granulocytes: 0.48 10*3/uL — ABNORMAL HIGH (ref 0.00–0.07)
Basophils Absolute: 0 10*3/uL (ref 0.0–0.1)
Basophils Relative: 0 %
Eosinophils Absolute: 0 10*3/uL (ref 0.0–0.5)
Eosinophils Relative: 0 %
HCT: 43.8 % (ref 36.0–46.0)
Hemoglobin: 15.1 g/dL — ABNORMAL HIGH (ref 12.0–15.0)
Immature Granulocytes: 3 %
Lymphocytes Relative: 9 %
Lymphs Abs: 1.4 10*3/uL (ref 0.7–4.0)
MCH: 30.9 pg (ref 26.0–34.0)
MCHC: 34.5 g/dL (ref 30.0–36.0)
MCV: 89.6 fL (ref 80.0–100.0)
Monocytes Absolute: 0.8 10*3/uL (ref 0.1–1.0)
Monocytes Relative: 5 %
Neutro Abs: 13.3 10*3/uL — ABNORMAL HIGH (ref 1.7–7.7)
Neutrophils Relative %: 83 %
Platelets: 311 10*3/uL (ref 150–400)
RBC: 4.89 MIL/uL (ref 3.87–5.11)
RDW: 12.3 % (ref 11.5–15.5)
WBC: 16 10*3/uL — ABNORMAL HIGH (ref 4.0–10.5)
nRBC: 0 % (ref 0.0–0.2)

## 2020-02-01 LAB — FIBRIN DERIVATIVES D-DIMER (ARMC ONLY): Fibrin derivatives D-dimer (ARMC): 3248.42 ng/mL (FEU) — ABNORMAL HIGH (ref 0.00–499.00)

## 2020-02-01 LAB — COMPREHENSIVE METABOLIC PANEL
ALT: 82 U/L — ABNORMAL HIGH (ref 0–44)
AST: 41 U/L (ref 15–41)
Albumin: 3.5 g/dL (ref 3.5–5.0)
Alkaline Phosphatase: 58 U/L (ref 38–126)
Anion gap: 9 (ref 5–15)
BUN: 24 mg/dL — ABNORMAL HIGH (ref 8–23)
CO2: 22 mmol/L (ref 22–32)
Calcium: 8.9 mg/dL (ref 8.9–10.3)
Chloride: 108 mmol/L (ref 98–111)
Creatinine, Ser: 0.97 mg/dL (ref 0.44–1.00)
GFR calc Af Amer: >60 mL/min (ref >60)
GFR calc non Af Amer: 58 mL/min — ABNORMAL LOW (ref >60)
Glucose, Bld: 145 mg/dL — ABNORMAL HIGH (ref 70–99)
Potassium: 4.4 mmol/L (ref 3.5–5.1)
Sodium: 139 mmol/L (ref 135–145)
Total Bilirubin: 1 mg/dL (ref 0.3–1.2)
Total Protein: 6.3 g/dL — ABNORMAL LOW (ref 6.5–8.1)

## 2020-02-01 LAB — LACTATE DEHYDROGENASE: LDH: 231 U/L — ABNORMAL HIGH (ref 98–192)

## 2020-02-01 LAB — MAGNESIUM: Magnesium: 2.4 mg/dL (ref 1.7–2.4)

## 2020-02-01 LAB — PHOSPHORUS: Phosphorus: 3.7 mg/dL (ref 2.5–4.6)

## 2020-02-01 LAB — FERRITIN: Ferritin: 648 ng/mL — ABNORMAL HIGH (ref 11–307)

## 2020-02-01 MED ORDER — DEXAMETHASONE 6 MG PO TABS: 6.0000 mg | ORAL_TABLET | Freq: Every day | ORAL | 0 refills | Status: DC

## 2020-02-01 MED ORDER — GUAIFENESIN-DM 100-10 MG/5ML PO SYRP: 10.0000 mL | ORAL_SOLUTION | ORAL | 0 refills | Status: DC | PRN

## 2020-02-01 MED ORDER — RIVAROXABAN 15 MG PO TABS
15.0000 mg | ORAL_TABLET | Freq: Two times a day (BID) | ORAL | 0 refills | Status: DC
Start: 2020-02-01 — End: 2020-02-26

## 2020-02-01 MED ORDER — RIVAROXABAN 20 MG PO TABS
20.0000 mg | ORAL_TABLET | Freq: Every day | ORAL | 0 refills | Status: DC
Start: 2020-02-20 — End: 2020-02-26

## 2020-02-01 MED ORDER — SENNA 8.6 MG PO TABS: 1.0000 | ORAL_TABLET | Freq: Two times a day (BID) | ORAL | 0 refills | Status: DC

## 2020-02-01 MED ORDER — TRAMADOL HCL 50 MG PO TABS
50.0000 mg | ORAL_TABLET | Freq: Three times a day (TID) | ORAL | 0 refills | Status: DC | PRN
Start: 2020-02-01 — End: 2020-04-06

## 2020-02-01 MED ORDER — DM-GUAIFENESIN ER 30-600 MG PO TB12: 1.0000 | ORAL_TABLET | Freq: Two times a day (BID) | ORAL | 0 refills | Status: DC

## 2020-02-01 MED ORDER — ASCORBIC ACID 500 MG PO TABS
500.0000 mg | ORAL_TABLET | Freq: Every day | ORAL | 0 refills | Status: DC
Start: 2020-02-02 — End: 2020-03-13

## 2020-02-01 MED ORDER — METOPROLOL TARTRATE 25 MG PO TABS
25.0000 mg | ORAL_TABLET | Freq: Two times a day (BID) | ORAL | 0 refills | Status: DC
Start: 2020-02-01 — End: 2020-02-26

## 2020-02-01 MED ORDER — ZINC SULFATE 220 (50 ZN) MG PO CAPS
220.0000 mg | ORAL_CAPSULE | Freq: Every day | ORAL | 0 refills | Status: DC
Start: 2020-02-02 — End: 2023-11-02

## 2020-02-01 MED ORDER — LOSARTAN POTASSIUM 25 MG PO TABS
12.5000 mg | ORAL_TABLET | Freq: Every day | ORAL | Status: DC
  Administered 2020-02-01: 12.5 mg via ORAL
  Filled 2020-02-01: qty 1

## 2020-02-01 MED ORDER — MIDODRINE HCL 2.5 MG PO TABS
2.5000 mg | ORAL_TABLET | Freq: Three times a day (TID) | ORAL | 0 refills | Status: DC
Start: 2020-02-01 — End: 2020-02-26

## 2020-02-01 MED ORDER — ONDANSETRON HCL 4 MG PO TABS: 4.0000 mg | ORAL_TABLET | Freq: Four times a day (QID) | ORAL | 0 refills | Status: DC | PRN

## 2020-02-01 NOTE — TOC Transition Note (Signed)
Transition of Care [facility]) - CM/SW Discharge Note   Patient Details  Name: Audrey Martinez MRN: [id number] Date of Birth: [birth year]  Transition of Care [facility] CM/SW Contact:  Harriet Masson, RN Phone Number: 02/01/2020, 5:11 PM   Clinical Narrative:     Pt to be discharged today with [facility] for PT/OT/RN/aide services. Spoke with Melissa at [facility] who will accommodate that's needs. RN attempted to speak with the pt however unsuccessful.   Pt has discharged from the hospital with [facility] for [facility] services. Team aware of pt's disposition.  No further needs at this time.      Barriers to Discharge: Continued Medical Work up   Patient Goals and CMS Choice Patient states their goals for this hospitalization and ongoing recovery are:: would like to go home with home health services CMS Medicare.gov Compare Post Acute Care list provided to:: Patient Choice offered to / list presented to : Patient  Discharge Placement                       Discharge Plan and Services   Discharge Planning Services: CM Consult Post Acute Care Choice: Home Health                    HH Arranged: RN, PT, OT, Nurse's Aide [facility] Agency: [place] ([place]) Date [facility] Agency Contacted: 01/31/20 Time [place]: [phone] Representative spoke with at [place]: [place] ([place]) Interventions     Readmission Risk Interventions No flowsheet data found.

## 2020-02-01 NOTE — Progress Notes (Signed)
Progress Note  Patient Name: Audrey Martinez Date of Encounter: 02/01/2020  [facility] Cardiologist: Ida Rogue, MD   Subjective   No acute events overnight.  Has occasional cough this morning, feels like she is breaking up the phlegm.  Started on p.o. Xarelto.  Inpatient Medications    Scheduled Meds: . acetic acid  4 drop Both EARS QID  . vitamin C  500 mg Oral Daily  . baclofen  10 mg Oral TID  . benzonatate  100 mg Oral TID  . cholecalciferol  1,000 Units Oral Daily  . dextromethorphan-guaiFENesin  1 tablet Oral BID  . ezetimibe  10 mg Oral Daily  . fluticasone  2 puff Inhalation BID  . gabapentin  300 mg Oral TID  . levalbuterol  1-2 puff Inhalation Q6H  . methylPREDNISolone (SOLU-MEDROL) injection  40 mg Intravenous Q12H  . metoprolol tartrate  25 mg Oral BID  . midodrine  2.5 mg Oral TID WC  . mometasone-formoterol  2 puff Inhalation BID  . montelukast  10 mg Oral QPM  . multivitamin with minerals  1 tablet Oral Daily  . Rivaroxaban  15 mg Oral BID WC   Followed by  . [START ON 02/20/2020] rivaroxaban  20 mg Oral Q supper  . senna  1 tablet Oral BID  . zinc sulfate  220 mg Oral Daily   Continuous Infusions:  PRN Meds: acetaminophen, guaiFENesin-dextromethorphan, metoprolol tartrate, ondansetron **OR** ondansetron (ZOFRAN) IV, polyethylene glycol, traMADol   Vital Signs    Vitals:   01/31/20 1741 01/31/20 2020 02/01/20 0614 02/01/20 0842  BP: 120/75 108/72 137/79 (!) 147/90  Pulse: 84 77 81 73  Resp: 18 18 20  (!) 22  Temp: 97.8 F (36.6 C) 98.5 F (36.9 C) 98.3 F (36.8 C) 98.4 F (36.9 C)  TempSrc: Oral Oral Oral Oral  SpO2: 93% 92% 94% 93%  Weight:   99.7 kg   Height:        Intake/Output Summary (Last 24 hours) at 02/01/2020 1048 Last data filed at 02/01/2020 0617 Gross per 24 hour  Intake 480 ml  Output 1000 ml  Net -520 ml   Last 3 Weights 02/01/2020 01/31/2020 01/29/2020  Weight (lbs) 219 lb 12.8 oz 224 lb 8 oz 209 lb 3.5 oz  Weight  (kg) 99.7 kg 101.833 kg 94.9 kg      Telemetry    Sinus rhythm- Personally Reviewed  ECG    No new tracing- Personally Reviewed  Physical Exam   GEN: No acute distress.   Neck: No JVD Cardiac: RRR, no murmurs, rubs, or gallops.  Respiratory:  Poor inspiratory effort GI: Soft, nontender, non-distended  MS: No edema; No deformity. Neuro:  Nonfocal  Psych: Normal affect   Labs    High Sensitivity Troponin:   Recent Labs  Lab 01/26/20 1130 01/26/20 1430 01/27/20 0643 01/27/20 1401  TROPONINIHS 7 60* 41* 25*      Chemistry Recent Labs  Lab 01/30/20 0547 01/31/20 0543 02/01/20 0557  NA 138 141 139  K 4.3 5.0 4.4  CL 107 112* 108  CO2 22 19* 22  GLUCOSE 171* 165* 145*  BUN 22 23 24*  CREATININE 0.87 0.93 0.97  CALCIUM 8.9 9.1 8.9  PROT 6.0* 5.9* 6.3*  ALBUMIN 3.2* 3.3* 3.5  AST 60* 56* 41  ALT 75* 83* 82*  ALKPHOS 56 60 58  BILITOT 1.1 0.8 1.0  GFRNONAA >60 >60 58*  GFRAA >60 >60 >60  ANIONGAP 9 10 9  Hematology Recent Labs  Lab 01/30/20 0547 01/31/20 0543 02/01/20 0557  WBC 10.2 16.9* 16.0*  RBC 4.61 4.79 4.89  HGB 14.3 14.9 15.1*  HCT 40.6 43.4 43.8  MCV 88.1 90.6 89.6  MCH 31.0 31.1 30.9  MCHC 35.2 34.3 34.5  RDW 11.7 11.9 12.3  PLT 240 302 311    BNP Recent Labs  Lab 01/26/20 1131  BNP 95.2     DDimer No results for input(s): DDIMER in the last 168 hours.   Radiology    No results found.  Cardiac Studies   TTE 12/2019 1. Left ventricular ejection fraction, by estimation, is 30 to 35%. The  left ventricle has moderately decreased function. The left ventricle  demonstrates global hypokinesis. Left ventricular diastolic parameters are  indeterminate. The average left  ventricular global longitudinal strain is -9.6 %.  2. Right ventricular systolic function is normal. The right ventricular  size is normal. There is normal pulmonary artery systolic pressure.  3. The mitral valve is normal in structure. Mild to moderate  mitral valve  regurgitation.  4. The aortic valve was not well visualized. Aortic valve regurgitation  is mild.  5. Ascending aorta 3.5 cm   Patient Profile     74 y.o. female with history of nonischemic cardiomyopathy, last EF 30 to 35%, paroxysmal SVT status post ablation, atrial tachycardia presenting to the hospital due to weakness and cough, diagnosed with COVID-19 pneumonia.  Hospital course complicated by bilateral PE.  Patient being seen due to tachycardia  Assessment & Plan    1.  Tachycardia -Heart rate currently controlled, 90 bpm -Continue Lopressor 25 mg twice daily. -Underlying illness Covid and/PE likely driving tachycardia.  2.  Nonischemic cardiomyopathy, EF 30 to 35%. -Currently euvolemic -Continue Lopressor for now.  Switch to long-acting Toprol-XL upon discharge. -Blood pressure improved.  Start PTA losartan 12.5 mg daily.  3. Bilateral PE  -Continue therapeutic Xarelto.  4.  COVID-19 pneumonia -Management as per primary team  Total encounter time 35 minutes  Greater than 50% was spent in counseling and coordination of care with the patient       Signed, Kate Sable, MD  02/01/2020, 10:48 AM

## 2020-02-01 NOTE — Discharge Summary (Signed)
Physician Discharge Summary  Audrey Martinez GGE:[id number] DOB: 08-27-1945 DOA: 01/26/2020  PCP: Audrey Hess, MD  Admit date: 01/26/2020 Discharge date: 02/04/2020  Time spent: 35 minutes  Recommendations for Outpatient Follow-up:   Covid vaccination; not vaccinated  Acute respiratory failure with hypoxia/Covid pneumonia COVID-19 Labs  No results for input(s): DDIMER, FERRITIN, LDH, CRP in the last 72 hours.  Lab Results  Component Value Date   SARSCOV2NAA POSITIVE (A) 01/26/2020   SARSCOV2NAA NEGATIVE 04/29/2019   -Complete 5-day course Remdesivir -Solu-Medrol 40 mg BID---> Decadron 6 mg x 5 days -Vitamin C and zinc per Covid protocol -Incentive spirometer -Xopenex QID -Flutter valve -Titrate O2 to maintain SPO2> 88% -9/2 Patient ambulated in the room multiple times around the bed on room air. Patient was 93% at rest and then after ambulating, the lowest it came down to was 89% but recovered quickly and oxygen came back up to 92%. Patient stated she felt good and experienced some mild shortness of breath. -Patient does not meet criteria for home O2.  Acute bilateral PE without heart strain -9/2 Xarelto per pharmacy  LEFT lower lobe pulmonary nodule -Nodule does not meet the size concerning for malignancy (1 cm) however will need to be monitored per Fleischner criteria. -We will speak with patient to determine if she is a high risk or low risk patient.  We will need to ensure patient follows up with pulmonologist at discharge to ensure she is not lost to follow-up.  Hx asthma -See Covid pneumonia  Atrial tachycardia/flutter -Patient has Hx PSVT and Hx A. Fib. -Cardiology consult Dr. Rockey Martinez -9/2 DC metoprolol per cardiology -Metoprolol 25 mg BID  Hypotension -9/2 midodrine 2.5 mg TID -9/3 knee-high TED hose. 12 hours on during the day; 12 hours off at night  HLD -9/2 LDL = 68  -Zetia 10 mg daily  Acute renal failure (baseline Cr 0.92-1.2) Lab  Results  Component Value Date   CREATININE 0.97 02/01/2020   CREATININE 0.93 01/31/2020   CREATININE 0.87 01/30/2020   CREATININE 1.02 (H) 01/29/2020   CREATININE 0.86 01/28/2020  -Baseline  Obesity (BMI 32.72 kg/m). -Acutely nothing to be done  Elevated liver enzymes    Discharge Diagnoses:  Active Problems:   Pneumonia due to COVID-19 virus   Tachycardia   Acute septic pulmonary embolism (HCC)   Acute respiratory failure with hypoxia (HCC)   Acute pulmonary embolus (HCC)   Left lower lobe pulmonary nodule   Pulmonary embolus (HCC)   Atrial flutter (HCC)   Hypotension   HLD (hyperlipidemia)   Acute renal failure (ARF) (HCC)   Obesity (BMI 30-39.9)   Discharge Condition: Stable  Diet recommendation: Heart healthy  Filed Weights   01/29/20 0524 01/31/20 0403 02/01/20 0614  Weight: 94.9 kg 101.8 kg 99.7 kg    History of present illness:  74 year old WF PMHx  nonischemic cardiomyopathy, SVT, AVNRT, HLD, osteoporosis, CVA without residual deficit comes to the hospital with shortness of breath. Found to have COVID-19 pneumonia. Currently plan iscontinue current treatment  Hospital Course:  See above  Procedures: 8/31 CT Angio PE protocol; Bilateral pulmonary emboli, without right heart strain. -Peripheral predominant airspace and ground-glass opacities,consistent with COVID-19 pneumonia. -6 mm left lower lobe pulmonary nodule. Non-contrast chest CT at 6-12 months is recommended. If the nodule is stable at time of repeat CT, then future CT at 18-24 months (from today's scan) is considered optional for low-risk patients, but is recommended for high-risk patients. This recommendation follows the consensus statement: Guidelines for Management  of Incidental Pulmonary Nodules Detected on CT Images: From the Fleischner Society 2017; Radiology  Consultations: Cardiology consult Dr. Rockey Martinez  Ventilator settings Room air 9/3 Flow;  SPO2; 92%  Cultures   8/29  SARS coronavirus positive  Antibiotics Anti-infectives (From admission, onward)   Start     Ordered Stop   01/27/20 1000  remdesivir 100 mg in sodium chloride 0.9 % 100 mL IVPB       "Followed by" Linked Group Details   01/26/20 1346 01/30/20 1028   01/26/20 1530  remdesivir 200 mg in sodium chloride 0.9% 250 mL IVPB       "Followed by" Linked Group Details   01/26/20 1346 01/26/20 1656       Discharge Exam: Vitals:   01/31/20 1741 01/31/20 2020 02/01/20 0614 02/01/20 0842  BP: 120/75 108/72 137/79 (!) 147/90  Pulse: 84 77 81 73  Resp: 18 18 20  (!) 22  Temp: 97.8 F (36.6 C) 98.5 F (36.9 C) 98.3 F (36.8 C) 98.4 F (36.9 C)  TempSrc: Oral Oral Oral Oral  SpO2: 93% 92% 94% 93%  Weight:   99.7 kg   Height:        General: A/O x4, positive acute respiratory distress Eyes: negative scleral hemorrhage, negative anisocoria, negative icterus ENT: Negative Runny nose, negative gingival bleeding, Neck:  Negative scars, masses, torticollis, lymphadenopathy, JVD Lungs: positive diffuse rhonchi, positive wheezes, negative crackles Cardiovascular: Regular rate and rhythm without murmur gallop or rub normal S1 and S2   Discharge Instructions   Allergies as of 02/01/2020      Reactions   Amoxicillin Hives, Shortness Of Breath, Swelling   Did it involve swelling of the face/tongue/throat, SOB, or low BP? Yes Did it involve sudden or severe rash/hives, skin peeling, or any reaction on the inside of your mouth or nose? Yes Did you need to seek medical attention at a hospital or doctor's office? Yes When did it last happen?1998 If all above answers are "NO", may proceed with cephalosporin use.   Aspirin Anaphylaxis   Ciprofloxacin Rash   Ciprofloxacin-dexamethasone Anaphylaxis   Crestor [rosuvastatin] Other (See Comments)   Caused problems with memory.   Morphine And Related Nausea And Vomiting   Doxycycline Hyclate Hives   Hydrochlorothiazide Other (See Comments)    Raises her blood pressure.    Keflex [cephalexin] Hives   Lovastatin Other (See Comments)   "Unable to function"   Simvastatin Other (See Comments)   "Unable to function"   Erythromycin Hives   Ibuprofen Other (See Comments)   Sharp stomach pains   Zetia [ezetimibe] Other (See Comments)   Blurry eyes, felt funny, memory problems.    Neomycin-bacitracin Zn-polymyx Hives, Rash   Omeprazole Palpitations   Tachycardia and same sensation as her SVTs per patient   Tape Hives, Rash      Medication List    STOP taking these medications   carvedilol 3.125 MG tablet Commonly known as: COREG   diltiazem 30 MG tablet Commonly known as: CARDIZEM   losartan 25 MG tablet Commonly known as: COZAAR     TAKE these medications   albuterol 108 (90 Base) MCG/ACT inhaler Commonly known as: VENTOLIN HFA Inhale 1-2 puffs into the lungs every 6 (six) hours as needed for wheezing or shortness of breath.   ascorbic acid 500 MG tablet Commonly known as: VITAMIN C Take 1 tablet (500 mg total) by mouth daily.   B-complex with vitamin C tablet Take 1 tablet by mouth daily.   baclofen  10 MG tablet Commonly known as: LIORESAL TAKE 1 TABLET BY MOUTH THREE TIMES A DAY   CALCIUM 1000 + D PO Take 1 tablet by mouth daily.   cholecalciferol 25 MCG (1000 UNIT) tablet Commonly known as: VITAMIN D Take 1,000 Units by mouth daily.   Cranberry 1000 MG Caps Take 1,000 mg by mouth daily.   dexamethasone 6 MG tablet Commonly known as: DECADRON Take 1 tablet (6 mg total) by mouth daily.   dextromethorphan-guaiFENesin 30-600 MG 12hr tablet Commonly known as: MUCINEX DM Take 1 tablet by mouth 2 (two) times daily.   guaiFENesin-dextromethorphan 100-10 MG/5ML syrup Commonly known as: ROBITUSSIN DM Take 10 mLs by mouth every 4 (four) hours as needed for cough.   Ester-C Tabs Take 1 tablet by mouth 2 (two) times daily.   ezetimibe 10 MG tablet Commonly known as: ZETIA TAKE 1 TABLET BY MOUTH EVERY  DAY   fluticasone 220 MCG/ACT inhaler Commonly known as: FLOVENT HFA Inhale into the lungs 2 (two) times daily. What changed: Another medication with the same name was removed. Continue taking this medication, and follow the directions you see here.   gabapentin 300 MG capsule Commonly known as: NEURONTIN TAKE 1 CAPSULE (300 MG TOTAL) BY MOUTH 3 (THREE) TIMES DAILY. THREE CAPSULES DAILY AT BEDTIME. What changed:   when to take this  reasons to take this  additional instructions   GINGER PO Take 1,100 mg by mouth daily.   MAG ASPART-POTASSIUM ASPART PO Take 1 tablet by mouth 2 (two) times daily. 600 mg-198 mg   metoprolol tartrate 25 MG tablet Commonly known as: LOPRESSOR Take 1 tablet (25 mg total) by mouth 2 (two) times daily.   midodrine 2.5 MG tablet Commonly known as: PROAMATINE Take 1 tablet (2.5 mg total) by mouth 3 (three) times daily with meals.   montelukast 10 MG tablet Commonly known as: SINGULAIR Take 10 mg by mouth every evening.   multivitamin with minerals tablet Take 1 tablet by mouth daily. Centrum Silver   ondansetron 4 MG tablet Commonly known as: ZOFRAN Take 1 tablet (4 mg total) by mouth every 6 (six) hours as needed for nausea.   PROBIOTIC DAILY PO Take 1 capsule by mouth 2 (two) times a week.   Rivaroxaban 15 MG Tabs tablet Commonly known as: XARELTO Take 1 tablet (15 mg total) by mouth 2 (two) times daily with a meal.   rivaroxaban 20 MG Tabs tablet Commonly known as: XARELTO Take 1 tablet (20 mg total) by mouth daily with supper. Start taking on: February 20, 2020   senna 8.6 MG Tabs tablet Commonly known as: SENOKOT Take 1 tablet (8.6 mg total) by mouth 2 (two) times daily.   Striverdi Respimat 2.5 MCG/ACT Aers Generic drug: Olodaterol HCl Inhale 2 puffs into the lungs daily. USE AT THE SAME TIME EVERYDAY   TART CHERRY ADVANCED PO Take 1 capsule by mouth daily.   traMADol 50 MG tablet Commonly known as: ULTRAM Take 1  tablet (50 mg total) by mouth every 8 (eight) hours as needed for moderate pain.   Turmeric 500 MG Caps Take 500 mg by mouth daily.   zinc sulfate 220 (50 Zn) MG capsule Take 1 capsule (220 mg total) by mouth daily.      Allergies  Allergen Reactions  . Amoxicillin Hives, Shortness Of Breath and Swelling    Did it involve swelling of the face/tongue/throat, SOB, or low BP? Yes Did it involve sudden or severe rash/hives, skin peeling, or any  reaction on the inside of your mouth or nose? Yes Did you need to seek medical attention at a hospital or doctor's office? Yes When did it last happen?1998 If all above answers are "NO", may proceed with cephalosporin use.   . Aspirin Anaphylaxis  . Ciprofloxacin Rash  . Ciprofloxacin-Dexamethasone Anaphylaxis  . Crestor [Rosuvastatin] Other (See Comments)    Caused problems with memory.  . Morphine And Related Nausea And Vomiting  . Doxycycline Hyclate Hives  . Hydrochlorothiazide Other (See Comments)    Raises her blood pressure.   Marland Kitchen Keflex [Cephalexin] Hives  . Lovastatin Other (See Comments)    "Unable to function"  . Simvastatin Other (See Comments)    "Unable to function"  . Erythromycin Hives  . Ibuprofen Other (See Comments)    Sharp stomach pains  . Zetia [Ezetimibe] Other (See Comments)    Blurry eyes, felt funny, memory problems.   . Neomycin-Bacitracin Zn-Polymyx Hives and Rash  . Omeprazole Palpitations    Tachycardia and same sensation as her SVTs per patient  . Tape Hives and Rash      The results of significant diagnostics from this hospitalization (including imaging, microbiology, ancillary and laboratory) are listed below for reference.    Significant Diagnostic Studies: DG Chest 1 View  Result Date: 01/26/2020 CLINICAL DATA:  Tachycardia EXAM: CHEST  1 VIEW COMPARISON:  Chest radiograph dated 08/22/2018 FINDINGS: The heart size and mediastinal contours are within normal limits. Mild diffuse bilateral  interstitial opacities are noted. Mild patchy bilateral airspace opacities are noted. There is no pleural effusion or pneumothorax. A right shoulder arthroplasty is redemonstrated. Degenerative changes are seen in the spine. IMPRESSION: Mild bilateral interstitial and airspace opacities may represent pulmonary edema or multifocal pneumonia. Electronically Signed   By: Zerita Boers M.D.   On: 01/26/2020 12:12   CT ANGIO CHEST PE W OR WO CONTRAST  Result Date: 01/28/2020 CLINICAL DATA:  Hypoxia.  COVID 19 positive. EXAM: CT ANGIOGRAPHY CHEST WITH CONTRAST TECHNIQUE: Multidetector CT imaging of the chest was performed using the standard protocol during bolus administration of intravenous contrast. Multiplanar CT image reconstructions and MIPs were obtained to evaluate the vascular anatomy. CONTRAST:  42mL OMNIPAQUE IOHEXOL 350 MG/ML SOLN COMPARISON:  01/26/2020 chest radiograph. FINDINGS: Cardiovascular: Bilateral, lobar and segmental pulmonary emboli, greater right than left. Example 35/4 and 36/4. No right heart strain. There is reflux of contrast in the IVC, as can be seen with elevated right heart pressures. Normal aortic caliber, not well opacified. Tortuous thoracic aorta. Aortic atherosclerosis. Mild cardiomegaly. Mediastinum/Nodes: No mediastinal or hilar adenopathy. Lungs/Pleura: No pleural fluid. 6 mm left lower lobe pulmonary nodule on 44/6. Peripheral predominant airspace and ground-glass opacities bilaterally. Right base atelectasis adjacent to an elevated right hemidiaphragm. Upper Abdomen: Left hepatic lobe 1.5 cm cyst. Normal imaged portions of the spleen, stomach, pancreas, adrenal glands, left kidney. Musculoskeletal: Right shoulder arthroplasty. Multiple thoracic vertebral hemangiomas. Review of the MIP images confirms the above findings. IMPRESSION: 1. Bilateral pulmonary emboli, without right heart strain. 2. Peripheral predominant airspace and ground-glass opacities, consistent with COVID-19  pneumonia. 3. 6 mm left lower lobe pulmonary nodule. Non-contrast chest CT at 6-12 months is recommended. If the nodule is stable at time of repeat CT, then future CT at 18-24 months (from today's scan) is considered optional for low-risk patients, but is recommended for high-risk patients. This recommendation follows the consensus statement: Guidelines for Management of Incidental Pulmonary Nodules Detected on CT Images: From the Fleischner Society 2017; Radiology 2017; 284:228-243.  4. Aortic Atherosclerosis (ICD10-I70.0). These results will be called to the ordering clinician or representative by the Radiologist Assistant, and communication documented in the PACS or [employer]. Electronically Signed   By: Abigail Miyamoto M.D.   On: 01/28/2020 18:33   ECHOCARDIOGRAM COMPLETE  Result Date: 01/10/2020    ECHOCARDIOGRAM REPORT   Patient Name:   Amylynn HAZEL Hirota Date of Exam: 01/09/2020 Medical Rec #:  [id number]           Height:       67.0 in Accession #:    [id number]          Weight:       228.0 lb Date of Birth:  1945/06/12           BSA:          2.138 m Patient Age:    73 years            BP:           120/82 mmHg Patient Gender: F                   HR:           90 bpm. Exam Location:  [place] Procedure: 2D Echo, Cardiac Doppler and Color Doppler Indications:    I42.80 Non-ischemic cardiomyopathy  History:        Patient has prior history of Echocardiogram examinations, most                 recent 04/17/2019. Cardiomyopathy, COPD; Risk                 Factors:Hypertension, Dyslipidemia and Non-Smoker.  Sonographer:    Pilar Jarvis RDMS, RVT, RDCS Referring Phys: Elias-Fela Solis Comments: Suboptimal parasternal window. IMPRESSIONS  1. Left ventricular ejection fraction, by estimation, is 30 to 35%. The left ventricle has moderately decreased function. The left ventricle demonstrates global hypokinesis. Left ventricular diastolic parameters are indeterminate. The average left  ventricular global longitudinal strain is -9.6 %.  2. Right ventricular systolic function is normal. The right ventricular size is normal. There is normal pulmonary artery systolic pressure.  3. The mitral valve is normal in structure. Mild to moderate mitral valve regurgitation.  4. The aortic valve was not well visualized. Aortic valve regurgitation is mild.  5. Ascending aorta 3.5 cm FINDINGS  Left Ventricle: Left ventricular ejection fraction, by estimation, is 30 to 35%. The left ventricle has moderately decreased function. The left ventricle demonstrates global hypokinesis. The average left ventricular global longitudinal strain is -9.6 %.  The left ventricular internal cavity size was normal in size. There is no left ventricular hypertrophy. Left ventricular diastolic parameters are indeterminate. Right Ventricle: The right ventricular size is normal. No increase in right ventricular wall thickness. Right ventricular systolic function is normal. There is normal pulmonary artery systolic pressure. The tricuspid regurgitant velocity is 2.23 m/s, and  with an assumed right atrial pressure of 10 mmHg, the estimated right ventricular systolic pressure is 06.3 mmHg. Left Atrium: Left atrial size was normal in size. Right Atrium: Right atrial size was normal in size. Pericardium: There is no evidence of pericardial effusion. Mitral Valve: The mitral valve is normal in structure. Normal mobility of the mitral valve leaflets. Mild to moderate mitral valve regurgitation. No evidence of mitral valve stenosis. Tricuspid Valve: The tricuspid valve is normal in structure. Tricuspid valve regurgitation is not demonstrated. No evidence of tricuspid stenosis. Aortic Valve: The aortic valve  was not well visualized. Aortic valve regurgitation is mild. Aortic regurgitation PHT measures 567 msec. No aortic stenosis is present. Aortic valve mean gradient measures 3.0 mmHg. Aortic valve peak gradient measures 5.7 mmHg. Aortic valve  area, by VTI measures 3.47 cm. Pulmonic Valve: The pulmonic valve was normal in structure. Pulmonic valve regurgitation is not visualized. No evidence of pulmonic stenosis. Aorta: The aortic root is normal in size and structure. There is dilatation of the ascending aorta. Venous: The inferior vena cava is normal in size with greater than 50% respiratory variability, suggesting right atrial pressure of 3 mmHg. IAS/Shunts: No atrial level shunt detected by color flow Doppler.  LEFT VENTRICLE PLAX 2D LVOT diam:     2.20 cm     Diastology LV SV:         76          LV e' lateral:   5.11 cm/s LV SV Index:   36          LV E/e' lateral: 20.4 LVOT Area:     3.80 cm    LV e' medial:    4.35 cm/s                            LV E/e' medial:  23.9  LV Volumes (MOD)           2D Longitudinal Strain LV vol d, MOD A2C: 66.1 ml 2D Strain GLS (A2C):   -9.2 % LV vol d, MOD A4C: 67.2 ml 2D Strain GLS (A3C):   -8.9 % LV vol s, MOD A2C: 44.3 ml 2D Strain GLS (A4C):   -10.7 % LV vol s, MOD A4C: 47.8 ml 2D Strain GLS Avg:     -9.6 % LV SV MOD A2C:     21.8 ml LV SV MOD A4C:     67.2 ml LV SV MOD BP:      20.2 ml RIGHT VENTRICLE             IVC RV Basal diam:  2.50 cm     IVC diam: 1.00 cm RV S prime:     10.00 cm/s TAPSE (M-mode): 2.0 cm LEFT ATRIUM             Index       RIGHT ATRIUM           Index LA diam:        3.90 cm 1.82 cm/m  RA Area:     11.00 cm LA Vol (A2C):   30.0 ml 14.03 ml/m RA Volume:   22.50 ml  10.52 ml/m LA Vol (A4C):   31.5 ml 14.73 ml/m LA Biplane Vol: 31.4 ml 14.69 ml/m  AORTIC VALVE                   PULMONIC VALVE AV Area (Vmax):    3.67 cm    PV Vmax:       0.72 m/s AV Area (Vmean):   3.68 cm    PV Peak grad:  2.1 mmHg AV Area (VTI):     3.47 cm AV Vmax:           119.00 cm/s AV Vmean:          83.400 cm/s AV VTI:            0.220 m AV Peak Grad:      5.7 mmHg AV Mean Grad:      3.0 mmHg  LVOT Vmax:         115.00 cm/s LVOT Vmean:        80.700 cm/s LVOT VTI:          0.201 m LVOT/AV VTI ratio: 0.91 AI  PHT:            567 msec  AORTA Ao Root diam: 3.10 cm Ao Asc diam:  3.50 cm Ao Arch diam: 2.4 cm MITRAL VALVE                TRICUSPID VALVE MV Area (PHT): 5.34 cm     TR Peak grad:   19.9 mmHg MV Decel Time: 142 msec     TR Vmax:        223.00 cm/s MV E velocity: 104.00 cm/s MV A velocity: 37.70 cm/s   SHUNTS MV E/A ratio:  2.76         Systemic VTI:  0.20 m                             Systemic Diam: 2.20 cm Ida Rogue MD Electronically signed by Ida Rogue MD Signature Date/Time: 01/10/2020/2:56:39 PM    Final     Microbiology: Recent Results (from the past 240 hour(s))  SARS Coronavirus 2 by RT PCR (hospital order, performed in [facility] hospital lab) Nasopharyngeal Nasopharyngeal Swab     Status: Abnormal   Collection Time: 01/26/20 11:31 AM   Specimen: Nasopharyngeal Swab  Result Value Ref Range Status   SARS Coronavirus 2 POSITIVE (A) NEGATIVE Final    Comment: CRITICAL RESULT CALLED TO, READ BACK BY AND VERIFIED WITH: ANGELA [email or web address]  ON 01/26/20 BY HKP (NOTE) SARS-CoV-2 target nucleic acids are DETECTED  SARS-CoV-2 RNA is generally detectable in upper respiratory specimens  during the acute phase of infection.  Positive results are indicative  of the presence of the identified virus, but do not rule out bacterial infection or co-infection with other pathogens not detected by the test.  Clinical correlation with patient history and  other diagnostic information is necessary to determine patient infection status.  The expected result is negative.  Fact Sheet for Patients:   StrictlyIdeas.no   Fact Sheet for Healthcare Providers:   BankingDealers.co.za    This test is not yet approved or cleared by the [place] FDA and  has been authorized for detection and/or diagnosis of SARS-CoV-2 by FDA under an Emergency Use Authorization (EUA).  This EUA will remain in effect (mean ing this test can be used) for the duration of   the COVID-19 declaration under Section 564(b)(1) of the Act, 21 U.S.C. section 360-bbb-3(b)(1), unless the authorization is terminated or revoked sooner.  Performed at [facility], [address] [place], [place] [address]      Labs: Basic Metabolic Panel: Recent Labs  Lab 01/29/20 [phone] 01/29/20 0956 01/30/20 0547 01/31/20 0543 02/01/20 0557  NA 140  --  138 141 139  K 4.1  --  4.3 5.0 4.4  CL 108  --  107 112* 108  CO2 22  --  22 19* 22  GLUCOSE 100*  --  171* 165* 145*  BUN 20  --  22 23 24*  CREATININE 1.02*  --  0.87 0.93 0.97  CALCIUM 8.9  --  8.9 9.1 8.9  MG  --  2.1 2.3 2.6* 2.4  PHOS  --  3.5 3.4 3.9 3.7   Liver Function Tests: Recent Labs  Lab 01/29/20  8295 01/30/20 0547 01/31/20 0543 02/01/20 0557  AST 33 60* 56* 41  ALT 49* 75* 83* 82*  ALKPHOS 45 56 60 58  BILITOT 1.2 1.1 0.8 1.0  PROT 5.6* 6.0* 5.9* 6.3*  ALBUMIN 3.1* 3.2* 3.3* 3.5   No results for input(s): LIPASE, AMYLASE in the last 168 hours. No results for input(s): AMMONIA in the last 168 hours. CBC: Recent Labs  Lab 01/29/20 0641 01/30/20 0547 01/31/20 0543 02/01/20 0557  WBC 7.4 10.2 16.9* 16.0*  NEUTROABS  --  8.6* 14.5* 13.3*  HGB 13.8 14.3 14.9 15.1*  HCT 41.2 40.6 43.4 43.8  MCV 92.2 88.1 90.6 89.6  PLT 208 240 302 311   Cardiac Enzymes: No results for input(s): CKTOTAL, CKMB, CKMBINDEX, TROPONINI in the last 168 hours. BNP: BNP (last 3 results) Recent Labs    01/26/20 1131  BNP 95.2    ProBNP (last 3 results) No results for input(s): PROBNP in the last 8760 hours.  CBG: No results for input(s): GLUCAP in the last 168 hours.     Signed:  Dia Crawford, MD [facility] [phone] pager

## 2020-02-26 ENCOUNTER — Telehealth: Payer: Self-pay | Admitting: Cardiovascular Disease

## 2020-02-26 ENCOUNTER — Telehealth: Payer: Self-pay

## 2020-02-26 ENCOUNTER — Other Ambulatory Visit: Payer: Self-pay | Admitting: Cardiovascular Disease

## 2020-02-26 MED ORDER — MIDODRINE HCL 2.5 MG PO TABS
2.5000 mg | ORAL_TABLET | Freq: Three times a day (TID) | ORAL | 0 refills | Status: DC
Start: 2020-02-26 — End: 2020-03-27

## 2020-02-26 MED ORDER — METOPROLOL TARTRATE 25 MG PO TABS: 25.0000 mg | ORAL_TABLET | Freq: Two times a day (BID) | ORAL | 0 refills | Status: DC

## 2020-02-26 MED ORDER — RIVAROXABAN 20 MG PO TABS
20.0000 mg | ORAL_TABLET | Freq: Every day | ORAL | 1 refills | Status: DC
Start: 2020-02-26 — End: 2020-06-29

## 2020-02-26 NOTE — Telephone Encounter (Signed)
Refills sent to pharmacy. 

## 2020-02-26 NOTE — Telephone Encounter (Signed)
*  STAT* If patient is at the pharmacy, call can be transferred to refill team.   1. Which medications need to be refilled? (please list name of each medication and dose if known) Xarelto 20 MG    2. Which pharmacy/location (including street and city if local pharmacy) is medication to be sent to? [facility] [place]    3. Do they need a 30 day or 90 day supply? 90 Day

## 2020-02-26 NOTE — Telephone Encounter (Signed)
°*  STAT* If patient is at the pharmacy, call can be transferred to refill team.   1. Which medications need to be refilled? (please list name of each medication and dose if known) metoprolol 25 bid, midodrine 2.5 3x day  2. Which pharmacy/location (including street and city if local pharmacy) is medication to be sent to[facility] [place]  . Do they need a 30 day or 90 day supply? 90    Patient needs assistance with Xarelto due to cost

## 2020-02-26 NOTE — Telephone Encounter (Signed)
Pt's age 74, wt 99.7 kg, SCr 0.97, CrCl 82.51. Refill Xarelto 20 mg once daily, #90 sent in as requested.

## 2020-02-27 ENCOUNTER — Ambulatory Visit: Payer: Medicare Other | Admitting: Internal Medicine

## 2020-02-27 NOTE — Telephone Encounter (Signed)
Left voicemail message that medication refills have been sent in and assistance paperwork.

## 2020-02-27 NOTE — Telephone Encounter (Signed)
Spoke with patient and reviewed that refills have been sent in and that I would mail her application to complete and drop off here at our office once done. She was appreciative for the call with no further questions at this time.

## 2020-03-13 ENCOUNTER — Other Ambulatory Visit: Payer: Self-pay | Admitting: Internal Medicine

## 2020-03-13 ENCOUNTER — Ambulatory Visit: Payer: Self-pay | Admitting: *Deleted

## 2020-03-13 ENCOUNTER — Telehealth: Payer: Self-pay

## 2020-03-13 ENCOUNTER — Other Ambulatory Visit: Payer: Self-pay

## 2020-03-13 ENCOUNTER — Ambulatory Visit (INDEPENDENT_AMBULATORY_CARE_PROVIDER_SITE_OTHER): Payer: Medicare Other | Admitting: Internal Medicine

## 2020-03-13 ENCOUNTER — Encounter: Payer: Self-pay | Admitting: Internal Medicine

## 2020-03-13 VITALS — BP 126/78 | HR 111 | Temp 98.7°F | Ht 68.0 in | Wt 226.0 lb

## 2020-03-13 DIAGNOSIS — I2601 Septic pulmonary embolism with acute cor pulmonale: Secondary | ICD-10-CM | POA: Diagnosis not present

## 2020-03-13 DIAGNOSIS — R197 Diarrhea, unspecified: Secondary | ICD-10-CM | POA: Diagnosis not present

## 2020-03-13 NOTE — Progress Notes (Unsigned)
Date:  03/13/2020   Name:  Audrey Martinez   DOB:  1946/03/05   MRN:  [id number]   Chief Complaint: No chief complaint on file. Hospitalized with Covid and complications last month.  Details from discharge below: Discharge Diagnoses:  Active Problems:   Pneumonia due to COVID-19 virus   Tachycardia   Acute septic pulmonary embolism (HCC) - discharged on Eliquis   Acute respiratory failure with hypoxia (HCC)   Acute pulmonary embolus (HCC)   Left lower lobe pulmonary nodule - does not meet criteria for malignancy   Pulmonary embolus (HCC)   Atrial flutter (HCC) - metoprolol 25 mg bid   Hypotension - medications held at discharge; midodrine tid   HLD (hyperlipidemia) - on zetia; intolerant of statins   Acute renal failure (ARF) ([place]) - back to baseline at discharge   Obesity (BMI 30-39.9) HPI  Lab Results  Component Value Date   CREATININE 0.97 02/01/2020   BUN 24 (H) 02/01/2020   NA 139 02/01/2020   K 4.4 02/01/2020   CL 108 02/01/2020   CO2 22 02/01/2020   Lab Results  Component Value Date   CHOL 113 01/30/2020   HDL 40 (L) 01/30/2020   LDLCALC 68 01/30/2020   TRIG 27 01/30/2020   CHOLHDL 2.8 01/30/2020   Lab Results  Component Value Date   TSH 0.552 01/27/2020   No results found for: HGBA1C Lab Results  Component Value Date   WBC 16.0 (H) 02/01/2020   HGB 15.1 (H) 02/01/2020   HCT 43.8 02/01/2020   MCV 89.6 02/01/2020   PLT 311 02/01/2020   Lab Results  Component Value Date   ALT 82 (H) 02/01/2020   AST 41 02/01/2020   ALKPHOS 58 02/01/2020   BILITOT 1.0 02/01/2020     Review of Systems  Patient Active Problem List   Diagnosis Date Noted  . Pulmonary embolus ([place]) 01/31/2020  . Atrial flutter ([place]) 01/31/2020  . Hypotension 01/31/2020  . HLD (hyperlipidemia) 01/31/2020  . Acute renal failure (ARF) ([place]) 01/31/2020  . Obesity (BMI 30-39.9) 01/31/2020  . Acute respiratory failure with hypoxia ([place]) 01/29/2020  . Acute pulmonary embolus  ([place]) 01/29/2020  . Left lower lobe pulmonary nodule 01/29/2020  . Tachycardia   . Acute septic pulmonary embolism ([place])   . Pneumonia due to COVID-19 virus 01/26/2020  . Atrial flutter by electrocardiogram ([place])   . Shortness of breath   . AKI (acute kidney injury) ([place])   . Chronic obstructive pulmonary disease, unspecified COPD type ([place]) 10/08/2019  . Dilated cardiomyopathy ([place]) 05/02/2019  . Amaurosis fugax of right eye 01/13/2019  . Colon cancer screening   . Arm pain, right 11/08/2017  . Renal insufficiency 11/08/2017  . Tendinitis of right knee 07/03/2017  . Lumbar radiculopathy 05/18/2017  . Status post hemilaminotomy 05/18/2017  . Lumbar degenerative disc disease 05/18/2017  . Spinal stenosis of lumbar region without neurogenic claudication 05/18/2017  . Atrial tachycardia ([place]) 04/14/2017  . SVT (supraventricular tachycardia) ([place]) 04/14/2017  . Essential hypertension 04/14/2017  . Intermittent asthma without complication 93/71/6967  . Osteoporosis 03/21/2017  . Benign essential tremor 03/21/2017  . Obesity 09/14/2011  . Hx of atrioventricular node ablation 02/01/2008  . Hyperlipidemia 05/12/2003  . Personal history of transient ischemic attack (TIA), and cerebral infarction without residual deficits 04/22/2003    Allergies  Allergen Reactions  . Amoxicillin Hives, Shortness Of Breath and Swelling    Did it involve swelling of the face/tongue/throat, SOB, or low BP? Yes  Did it involve sudden or severe rash/hives, skin peeling, or any reaction on the inside of your mouth or nose? Yes Did you need to seek medical attention at a hospital or doctor's office? Yes When did it last happen?1998 If all above answers are "NO", may proceed with cephalosporin use.   . Aspirin Anaphylaxis  . Ciprofloxacin Rash  . Ciprofloxacin-Dexamethasone Anaphylaxis  . Crestor [Rosuvastatin] Other (See Comments)    Caused problems with memory.  . Morphine And Related Nausea And  Vomiting  . Doxycycline Hyclate Hives  . Hydrochlorothiazide Other (See Comments)    Raises her blood pressure.   Marland Kitchen Keflex [Cephalexin] Hives  . Lovastatin Other (See Comments)    "Unable to function"  . Simvastatin Other (See Comments)    "Unable to function"  . Erythromycin Hives  . Ibuprofen Other (See Comments)    Sharp stomach pains  . Zetia [Ezetimibe] Other (See Comments)    Blurry eyes, felt funny, memory problems.   . Neomycin-Bacitracin Zn-Polymyx Hives and Rash  . Omeprazole Palpitations    Tachycardia and same sensation as her SVTs per patient  . Tape Hives and Rash    Past Surgical History:  Procedure Laterality Date  . BACK SURGERY  2017   L2-4 laminectomy and foraminal stenosis  . BREAST CYST ASPIRATION Left 1977  . CARDIAC CATHETERIZATION  2009   [facility] in [place]  . CARDIAC ELECTROPHYSIOLOGY STUDY AND ABLATION  2009  . CATARACT EXTRACTION, BILATERAL  2010  . COLONOSCOPY WITH PROPOFOL N/A 03/21/2018   Procedure: COLONOSCOPY WITH Biopsy;  Surgeon: Lin Landsman, MD;  Location: [place];  Service: Endoscopy;  Laterality: N/A;  . derrick procedure  1977-78   bilateral   . ESOPHAGOGASTRODUODENOSCOPY  2013   gastritis; done for complaint of dysphagia  . ESOPHAGOGASTRODUODENOSCOPY (EGD) WITH PROPOFOL N/A 03/21/2018   Procedure: ESOPHAGOGASTRODUODENOSCOPY (EGD) WITH Biopsies;  Surgeon: Lin Landsman, MD;  Location: [facility];  Service: Endoscopy;  Laterality: N/A;  KEEP THIS PATIENT FIRST  . LEFT HEART CATH AND CORONARY ANGIOGRAPHY Left 05/02/2019   Procedure: LEFT HEART CATH AND CORONARY ANGIOGRAPHY;  Surgeon: Minna Merritts, MD;  Location: [facility];  Service: Cardiovascular;  Laterality: Left;  . lymph node removal     neck  . MOHS SURGERY  07/2016   BCCA nose  . POLYPECTOMY N/A 03/21/2018   Procedure: POLYPECTOMY;  Surgeon: Lin Landsman, MD;  Location: [place];   Service: Endoscopy;  Laterality: N/A;  . REPLACEMENT TOTAL KNEE  2013   RT  . TOTAL SHOULDER REPLACEMENT  2012   RT     Social History   Tobacco Use  . Smoking status: Never Smoker  . Smokeless tobacco: Never Used  Vaping Use  . Vaping Use: Never used  Substance Use Topics  . Alcohol use: Not Currently    Comment: may drink 1-2x/yr  . Drug use: No     Medication list has been reviewed and updated.  No outpatient medications have been marked as taking for the 03/13/20 encounter (Orders Only) with Glean Hess, MD.    [facility] 2/9 Scores 11/25/2019 10/08/2019 01/28/2019 11/15/2018  PHQ - 2 Score 0 0 0 0  PHQ- 9 Score 0 0 - -    GAD 7 : Generalized Anxiety Score 11/25/2019  Nervous, Anxious, on Edge 0  Control/stop worrying 0  Worry too much - different things 0  Trouble relaxing 0  Restless 0  Easily annoyed or  irritable 0  Afraid - awful might happen 0  Total GAD 7 Score 0  Anxiety Difficulty Not difficult at all    BP Readings from Last 3 Encounters:  02/01/20 (!) 147/90  12/09/19 112/80  11/25/19 122/84    Physical Exam  Wt Readings from Last 3 Encounters:  02/01/20 219 lb 12.8 oz (99.7 kg)  12/09/19 228 lb (103.4 kg)  11/25/19 224 lb (101.6 kg)    There were no vitals taken for this visit.  Assessment and Plan:

## 2020-03-13 NOTE — Progress Notes (Signed)
Date:  03/13/2020   Name:  Audrey Martinez   DOB:  [birth year]   MRN:  [id number]   Chief Complaint: Diarrhea (X1 week, has not had diarrhea today pt states she hasnt eating anything, ) She was discharged from [facility] 02/04/20 after 8 day stay for Covid complicated by bilateral PE and pneumonia.  Diarrhea  This is a new problem. The current episode started in the past 7 days. The problem occurs 5 to 10 times per day (for the first few days; yesterday once and today none so far). The problem has been gradually improving. The stool consistency is described as watery. The patient states that diarrhea does not awaken her from sleep. Pertinent negatives include no abdominal pain, bloating, chills, fever, headaches, increased  flatus, vomiting or weight loss. Nothing aggravates the symptoms. There are no known risk factors (recovering from Covid - d/c 5 wks ago). She has tried bismuth subsalicylate for the symptoms. The treatment provided mild relief.    Lab Results  Component Value Date   CREATININE 0.97 02/01/2020   BUN 24 (H) 02/01/2020   NA 139 02/01/2020   K 4.4 02/01/2020   CL 108 02/01/2020   CO2 22 02/01/2020   Lab Results  Component Value Date   CHOL 113 01/30/2020   HDL 40 (L) 01/30/2020   LDLCALC 68 01/30/2020   TRIG 27 01/30/2020   CHOLHDL 2.8 01/30/2020   Lab Results  Component Value Date   TSH 0.552 01/27/2020   No results found for: HGBA1C Lab Results  Component Value Date   WBC 16.0 (H) 02/01/2020   HGB 15.1 (H) 02/01/2020   HCT 43.8 02/01/2020   MCV 89.6 02/01/2020   PLT 311 02/01/2020   Lab Results  Component Value Date   ALT 82 (H) 02/01/2020   AST 41 02/01/2020   ALKPHOS 58 02/01/2020   BILITOT 1.0 02/01/2020     Review of Systems  Constitutional: Positive for appetite change. Negative for chills, diaphoresis, fatigue, fever and weight loss.  Respiratory: Negative for chest tightness, shortness of breath and wheezing.   Cardiovascular: Negative  for chest pain and leg swelling.  Gastrointestinal: Positive for diarrhea. Negative for abdominal pain, bloating, blood in stool, flatus and vomiting.  Neurological: Negative for dizziness and headaches.    Patient Active Problem List   Diagnosis Date Noted  . Pulmonary embolus ([place]) 01/31/2020  . Atrial flutter ([place]) 01/31/2020  . Hypotension 01/31/2020  . HLD (hyperlipidemia) 01/31/2020  . Acute renal failure (ARF) ([place]) 01/31/2020  . Obesity (BMI 30-39.9) 01/31/2020  . Acute respiratory failure with hypoxia ([place]) 01/29/2020  . Acute pulmonary embolus ([place]) 01/29/2020  . Left lower lobe pulmonary nodule 01/29/2020  . Tachycardia   . Acute septic pulmonary embolism ([place])   . Pneumonia due to COVID-19 virus 01/26/2020  . Atrial flutter by electrocardiogram ([place])   . Shortness of breath   . AKI (acute kidney injury) ([place])   . Chronic obstructive pulmonary disease, unspecified COPD type ([place]) 10/08/2019  . Dilated cardiomyopathy ([place]) 05/02/2019  . Amaurosis fugax of right eye 01/13/2019  . Colon cancer screening   . Arm pain, right 11/08/2017  . Renal insufficiency 11/08/2017  . Tendinitis of right knee 07/03/2017  . Lumbar radiculopathy 05/18/2017  . Status post hemilaminotomy 05/18/2017  . Lumbar degenerative disc disease 05/18/2017  . Spinal stenosis of lumbar region without neurogenic claudication 05/18/2017  . Atrial tachycardia ([place]) 04/14/2017  . SVT (supraventricular tachycardia) ([place]) 04/14/2017  . Essential hypertension  04/14/2017  . Intermittent asthma without complication 30/86/5784  . Osteoporosis 03/21/2017  . Benign essential tremor 03/21/2017  . Obesity 09/14/2011  . Hx of atrioventricular node ablation 02/01/2008  . Hyperlipidemia 05/12/2003  . Personal history of transient ischemic attack (TIA), and cerebral infarction without residual deficits 04/22/2003    Allergies  Allergen Reactions  . Amoxicillin Hives, Shortness Of Breath and Swelling    Did it  involve swelling of the face/tongue/throat, SOB, or low BP? Yes Did it involve sudden or severe rash/hives, skin peeling, or any reaction on the inside of your mouth or nose? Yes Did you need to seek medical attention at a hospital or doctor's office? Yes When did it last happen?1998 If all above answers are "NO", may proceed with cephalosporin use.   . Aspirin Anaphylaxis  . Ciprofloxacin Rash  . Ciprofloxacin-Dexamethasone Anaphylaxis  . Crestor [Rosuvastatin] Other (See Comments)    Caused problems with memory.  . Morphine And Related Nausea And Vomiting  . Doxycycline Hyclate Hives  . Hydrochlorothiazide Other (See Comments)    Raises her blood pressure.   Marland Kitchen Keflex [Cephalexin] Hives  . Lovastatin Other (See Comments)    "Unable to function"  . Simvastatin Other (See Comments)    "Unable to function"  . Erythromycin Hives  . Ibuprofen Other (See Comments)    Sharp stomach pains  . Zetia [Ezetimibe] Other (See Comments)    Blurry eyes, felt funny, memory problems.   . Neomycin-Bacitracin Zn-Polymyx Hives and Rash  . Omeprazole Palpitations    Tachycardia and same sensation as her SVTs per patient  . Tape Hives and Rash    Past Surgical History:  Procedure Laterality Date  . BACK SURGERY  2017   L2-4 laminectomy and foraminal stenosis  . BREAST CYST ASPIRATION Left 1977  . CARDIAC CATHETERIZATION  2009   [facility] in [place]  . CARDIAC ELECTROPHYSIOLOGY STUDY AND ABLATION  2009  . CATARACT EXTRACTION, BILATERAL  2010  . COLONOSCOPY WITH PROPOFOL N/A 03/21/2018   Procedure: COLONOSCOPY WITH Biopsy;  Surgeon: Lin Landsman, MD;  Location: [facility];  Service: Endoscopy;  Laterality: N/A;  . derrick procedure  1977-78   bilateral   . ESOPHAGOGASTRODUODENOSCOPY  2013   gastritis; done for complaint of dysphagia  . ESOPHAGOGASTRODUODENOSCOPY (EGD) WITH PROPOFOL N/A 03/21/2018   Procedure: ESOPHAGOGASTRODUODENOSCOPY (EGD) WITH  Biopsies;  Surgeon: Lin Landsman, MD;  Location: [facility] [facility];  Service: Endoscopy;  Laterality: N/A;  KEEP THIS PATIENT FIRST  . LEFT HEART CATH AND CORONARY ANGIOGRAPHY Left 05/02/2019   Procedure: LEFT HEART CATH AND CORONARY ANGIOGRAPHY;  Surgeon: Minna Merritts, MD;  Location: [facility];  Service: Cardiovascular;  Laterality: Left;  . lymph node removal     neck  . MOHS SURGERY  07/2016   BCCA nose  . POLYPECTOMY N/A 03/21/2018   Procedure: POLYPECTOMY;  Surgeon: Lin Landsman, MD;  Location: [facility];  Service: Endoscopy;  Laterality: N/A;  . REPLACEMENT TOTAL KNEE  2013   RT  . TOTAL SHOULDER REPLACEMENT  2012   RT     Social History   Tobacco Use  . Smoking status: Never Smoker  . Smokeless tobacco: Never Used  Vaping Use  . Vaping Use: Never used  Substance Use Topics  . Alcohol use: Not Currently    Comment: may drink 1-2x/yr  . Drug use: No     Medication list has been reviewed and updated.  Current Meds  Medication  Sig  . albuterol (PROVENTIL HFA;VENTOLIN HFA) 108 (90 Base) MCG/ACT inhaler Inhale 1-2 puffs into the lungs every 6 (six) hours as needed for wheezing or shortness of breath.   . B Complex-C (B-COMPLEX WITH VITAMIN C) tablet Take 1 tablet by mouth daily.  . baclofen (LIORESAL) 10 MG tablet TAKE 1 TABLET BY MOUTH THREE TIMES A DAY (Patient taking differently: Take 10 mg by mouth 3 (three) times daily. )  . Bioflavonoid Products (ESTER-C) TABS Take 1 tablet by mouth 2 (two) times daily.   . Calcium Carb-Cholecalciferol (CALCIUM 1000 + D PO) Take 1 tablet by mouth daily.  . cholecalciferol (VITAMIN D) 25 MCG (1000 UT) tablet Take 1,000 Units by mouth daily.  . Cranberry 1000 MG CAPS Take 1,000 mg by mouth daily.   Marland Kitchen dexamethasone (DECADRON) 6 MG tablet Take 1 tablet (6 mg total) by mouth daily.  Marland Kitchen dextromethorphan-guaiFENesin (MUCINEX DM) 30-600 MG 12hr tablet Take 1 tablet by mouth 2 (two) times daily. (Patient  taking differently: Take 1 tablet by mouth as needed. )  . ezetimibe (ZETIA) 10 MG tablet TAKE 1 TABLET BY MOUTH EVERY DAY (Patient taking differently: Take 10 mg by mouth daily. )  . fluticasone (FLOVENT HFA) 220 MCG/ACT inhaler Inhale into the lungs 2 (two) times daily.  Marland Kitchen gabapentin (NEURONTIN) 300 MG capsule TAKE 1 CAPSULE (300 MG TOTAL) BY MOUTH 3 (THREE) TIMES DAILY. THREE CAPSULES DAILY AT BEDTIME. (Patient taking differently: Take 300 mg by mouth 3 (three) times daily as needed (nerve pain.). )  . losartan (COZAAR) 25 MG tablet Take 12.5 mg by mouth daily.  Marland Kitchen MAG ASPART-POTASSIUM ASPART PO Take 1 tablet by mouth 2 (two) times daily. 600 mg-198 mg  . metoprolol tartrate (LOPRESSOR) 25 MG tablet Take 1 tablet (25 mg total) by mouth 2 (two) times daily.  . midodrine (PROAMATINE) 2.5 MG tablet Take 1 tablet (2.5 mg total) by mouth 3 (three) times daily with meals.  . montelukast (SINGULAIR) 10 MG tablet Take 10 mg by mouth every evening.  . Multiple Vitamins-Minerals (MULTIVITAMIN WITH MINERALS) tablet Take 1 tablet by mouth daily. Centrum Silver  . Olodaterol HCl (STRIVERDI RESPIMAT) 2.5 MCG/ACT AERS Inhale 2 puffs into the lungs daily. USE AT THE SAME TIME EVERYDAY  . ondansetron (ZOFRAN) 4 MG tablet Take 1 tablet (4 mg total) by mouth every 6 (six) hours as needed for nausea.  . Probiotic Product (PROBIOTIC DAILY PO) Take 1 capsule by mouth 2 (two) times a week.   . rivaroxaban (XARELTO) 20 MG TABS tablet Take 1 tablet (20 mg total) by mouth daily with supper.  . Turmeric 500 MG CAPS Take 500 mg by mouth daily.   Marland Kitchen zinc sulfate 220 (50 Zn) MG capsule Take 1 capsule (220 mg total) by mouth daily.    PHQ 2/9 Scores 11/25/2019 10/08/2019 01/28/2019 11/15/2018  PHQ - 2 Score 0 0 0 0  PHQ- 9 Score 0 0 - -    GAD 7 : Generalized Anxiety Score 11/25/2019  Nervous, Anxious, on Edge 0  Control/stop worrying 0  Worry too much - different things 0  Trouble relaxing 0  Restless 0  Easily annoyed  or irritable 0  Afraid - awful might happen 0  Total GAD 7 Score 0  Anxiety Difficulty Not difficult at all    BP Readings from Last 3 Encounters:  03/13/20 126/78  02/01/20 (!) 147/90  12/09/19 112/80    Physical Exam Vitals and nursing note reviewed.  Constitutional:  General: She is not in acute distress.    Appearance: Normal appearance. She is well-developed.  HENT:     Head: Normocephalic and atraumatic.  Cardiovascular:     Rate and Rhythm: Normal rate and regular rhythm.     Pulses: Normal pulses.  Pulmonary:     Effort: Pulmonary effort is normal. No respiratory distress.     Breath sounds: No wheezing or rhonchi.  Abdominal:     General: There is no distension.     Palpations: Abdomen is soft.     Tenderness: There is no abdominal tenderness. There is no guarding or rebound.     Hernia: No hernia is present.  Musculoskeletal:        General: Normal range of motion.     Cervical back: Normal range of motion.  Lymphadenopathy:     Cervical: No cervical adenopathy.  Skin:    General: Skin is warm and dry.     Findings: No rash.  Neurological:     General: No focal deficit present.     Mental Status: She is alert and oriented to person, place, and time.  Psychiatric:        Behavior: Behavior normal.        Thought Content: Thought content normal.     Wt Readings from Last 3 Encounters:  03/13/20 226 lb (102.5 kg)  02/01/20 219 lb 12.8 oz (99.7 kg)  12/09/19 228 lb (103.4 kg)    BP 126/78 (BP Location: Left Arm, Patient Position: Sitting)   Pulse (!) 111   Temp 98.7 F (37.1 C) (Oral)   Ht 5\' 8"  (1.727 m)   Wt 226 lb (102.5 kg)   SpO2 96%   BMI 34.36 kg/m   Assessment and Plan: 1. Diarrhea, unspecified type Now resolving without treatment Likely functional - no recent hx of antibiotics to suggest C Diff. Recommend advancing diet to solid food and follow up if recurrent. Continue daily probiotics  2. Septic pulmonary embolism with acute  cor pulmonale, unspecified chronicity (HCC) On Xarelto daily - likely for 6 months course   Partially dictated using Editor, commissioning. Any errors are unintentional.  Halina Maidens, MD [facility]  03/13/2020

## 2020-03-13 NOTE — Telephone Encounter (Signed)
Please call and schedule for 1:20.  Thank you,  KP

## 2020-03-13 NOTE — Telephone Encounter (Signed)
Copied from [place] [id number]. Topic: Appointment Scheduling - Scheduling Inquiry for Clinic >> Mar 13, 2020  9:30 AM Lennox Solders wrote: Reason for CRM: Pt would like to see dr berglund asap. Pt is having diarrhea for about 1 wk. Pt does not have a fever. Pt was dx with covid a month ago and was in hospital for 1 wk.

## 2020-03-13 NOTE — Telephone Encounter (Signed)
Summary: please advise   Pt is trying to see dr berglund asap. I have sent a message to dr berglund. Pt is having diarrhea for about 1 wk. Please advise      Attempted to contact patient- left message to call office.

## 2020-03-13 NOTE — Telephone Encounter (Signed)
Attempted to call patient- she has an appointment scheduled today with PCP- left message to call back if needed prior to appointment.

## 2020-03-27 ENCOUNTER — Other Ambulatory Visit: Payer: Self-pay | Admitting: Cardiovascular Disease

## 2020-03-27 NOTE — Telephone Encounter (Signed)
Rx request sent to pharmacy.  

## 2020-03-30 ENCOUNTER — Ambulatory Visit: Payer: Medicare Other

## 2020-04-06 ENCOUNTER — Other Ambulatory Visit: Payer: Self-pay

## 2020-04-06 ENCOUNTER — Ambulatory Visit (INDEPENDENT_AMBULATORY_CARE_PROVIDER_SITE_OTHER): Payer: Medicare Other

## 2020-04-06 VITALS — BP 122/84 | HR 78 | Temp 98.1°F | Resp 16 | Ht 68.0 in | Wt 235.6 lb

## 2020-04-06 DIAGNOSIS — Z1231 Encounter for screening mammogram for malignant neoplasm of breast: Secondary | ICD-10-CM

## 2020-04-06 DIAGNOSIS — Z0000 Encounter for general adult medical examination without abnormal findings: Secondary | ICD-10-CM

## 2020-04-06 NOTE — Patient Instructions (Signed)
[name] , Thank you for taking time to come for your Medicare Wellness Visit. I appreciate your ongoing commitment to your health goals. Please review the following plan we discussed and let me know if I can assist you in the future.   Screening recommendations/referrals: Colonoscopy: done 03/21/18. Repeat 02/2021. Mammogram: done 06/04/19. Please call [phone] to schedule your mammogram.  Bone Density: done 02/26/19 Recommended yearly ophthalmology/optometry visit for glaucoma screening and checkup Recommended yearly dental visit for hygiene and checkup  Vaccinations: Influenza vaccine: declined Pneumococcal vaccine: done 07/26/13 Tdap vaccine: due Shingles vaccine: Shingrix discussed. Please contact your pharmacy for coverage information.  Covid-19:declined  Advanced directives: Please bring a copy of your health care power of attorney and living will to the office at your convenience.  Conditions/risks identified: Recommend increasing physical activity as tolerated  Next appointment: Follow up in one year for your annual wellness visit    Preventive Care 65 Years and Older, Female Preventive care refers to lifestyle choices and visits with your health care provider that can promote health and wellness. What does preventive care include?  A yearly physical exam. This is also called an annual well check.  Dental exams once or twice a year.  Routine eye exams. Ask your health care provider how often you should have your eyes checked.  Personal lifestyle choices, including:  Daily care of your teeth and gums.  Regular physical activity.  Eating a healthy diet.  Avoiding tobacco and drug use.  Limiting alcohol use.  Practicing safe sex.  Taking low-dose aspirin every day.  Taking vitamin and mineral supplements as recommended by your health care provider. What happens during an annual well check? The services and screenings done by your health care provider  during your annual well check will depend on your age, overall health, lifestyle risk factors, and family history of disease. Counseling  Your health care provider may ask you questions about your:  Alcohol use.  Tobacco use.  Drug use.  Emotional well-being.  Home and relationship well-being.  Sexual activity.  Eating habits.  History of falls.  Memory and ability to understand (cognition).  Work and work Statistician.  Reproductive health. Screening  You may have the following tests or measurements:  Height, weight, and BMI.  Blood pressure.  Lipid and cholesterol levels. These may be checked every 5 years, or more frequently if you are over 65 years old.  Skin check.  Lung cancer screening. You may have this screening every year starting at age 42 if you have a 30-pack-year history of smoking and currently smoke or have quit within the past 15 years.  Fecal occult blood test (FOBT) of the stool. You may have this test every year starting at age 61.  Flexible sigmoidoscopy or colonoscopy. You may have a sigmoidoscopy every 5 years or a colonoscopy every 10 years starting at age 45.  Hepatitis C blood test.  Hepatitis B blood test.  Sexually transmitted disease (STD) testing.  Diabetes screening. This is done by checking your blood sugar (glucose) after you have not eaten for a while (fasting). You may have this done every 1-3 years.  Bone density scan. This is done to screen for osteoporosis. You may have this done starting at age 69.  Mammogram. This may be done every 1-2 years. Talk to your health care provider about how often you should have regular mammograms. Talk with your health care provider about your test results, treatment options, and if necessary, the need for more  tests. Vaccines  Your health care provider may recommend certain vaccines, such as:  Influenza vaccine. This is recommended every year.  Tetanus, diphtheria, and acellular pertussis  (Tdap, Td) vaccine. You may need a Td booster every 10 years.  Zoster vaccine. You may need this after age 53.  Pneumococcal 13-valent conjugate (PCV13) vaccine. One dose is recommended after age 31.  Pneumococcal polysaccharide (PPSV23) vaccine. One dose is recommended after age 59. Talk to your health care provider about which screenings and vaccines you need and how often you need them. This information is not intended to replace advice given to you by your health care provider. Make sure you discuss any questions you have with your health care provider. Document Released: 06/12/2015 Document Revised: 02/03/2016 Document Reviewed: 03/17/2015 Elsevier Interactive Patient Education  2017 [place] Prevention in the Home Falls can cause injuries. They can happen to people of all ages. There are many things you can do to make your home safe and to help prevent falls. What can I do on the outside of my home?  Regularly fix the edges of walkways and driveways and fix any cracks.  Remove anything that might make you trip as you walk through a door, such as a raised step or threshold.  Trim any bushes or trees on the path to your home.  Use bright outdoor lighting.  Clear any walking paths of anything that might make someone trip, such as rocks or tools.  Regularly check to see if handrails are loose or broken. Make sure that both sides of any steps have handrails.  Any raised decks and porches should have guardrails on the edges.  Have any leaves, snow, or ice cleared regularly.  Use sand or salt on walking paths during winter.  Clean up any spills in your garage right away. This includes oil or grease spills. What can I do in the bathroom?  Use night lights.  Install grab bars by the toilet and in the tub and shower. Do not use towel bars as grab bars.  Use non-skid mats or decals in the tub or shower.  If you need to sit down in the shower, use a plastic, non-slip  stool.  Keep the floor dry. Clean up any water that spills on the floor as soon as it happens.  Remove soap buildup in the tub or shower regularly.  Attach bath mats securely with double-sided non-slip rug tape.  Do not have throw rugs and other things on the floor that can make you trip. What can I do in the bedroom?  Use night lights.  Make sure that you have a light by your bed that is easy to reach.  Do not use any sheets or blankets that are too big for your bed. They should not hang down onto the floor.  Have a firm chair that has side arms. You can use this for support while you get dressed.  Do not have throw rugs and other things on the floor that can make you trip. What can I do in the kitchen?  Clean up any spills right away.  Avoid walking on wet floors.  Keep items that you use a lot in easy-to-reach places.  If you need to reach something above you, use a strong step stool that has a grab bar.  Keep electrical cords out of the way.  Do not use floor polish or wax that makes floors slippery. If you must use wax, use non-skid floor  wax.  Do not have throw rugs and other things on the floor that can make you trip. What can I do with my stairs?  Do not leave any items on the stairs.  Make sure that there are handrails on both sides of the stairs and use them. Fix handrails that are broken or loose. Make sure that handrails are as long as the stairways.  Check any carpeting to make sure that it is firmly attached to the stairs. Fix any carpet that is loose or worn.  Avoid having throw rugs at the top or bottom of the stairs. If you do have throw rugs, attach them to the floor with carpet tape.  Make sure that you have a light switch at the top of the stairs and the bottom of the stairs. If you do not have them, ask someone to add them for you. What else can I do to help prevent falls?  Wear shoes that:  Do not have high heels.  Have rubber bottoms.  Are  comfortable and fit you well.  Are closed at the toe. Do not wear sandals.  If you use a stepladder:  Make sure that it is fully opened. Do not climb a closed stepladder.  Make sure that both sides of the stepladder are locked into place.  Ask someone to hold it for you, if possible.  Clearly mark and make sure that you can see:  Any grab bars or handrails.  First and last steps.  Where the edge of each step is.  Use tools that help you move around (mobility aids) if they are needed. These include:  Canes.  Walkers.  Scooters.  Crutches.  Turn on the lights when you go into a dark area. Replace any light bulbs as soon as they burn out.  Set up your furniture so you have a clear path. Avoid moving your furniture around.  If any of your floors are uneven, fix them.  If there are any pets around you, be aware of where they are.  Review your medicines with your doctor. Some medicines can make you feel dizzy. This can increase your chance of falling. Ask your doctor what other things that you can do to help prevent falls. This information is not intended to replace advice given to you by your health care provider. Make sure you discuss any questions you have with your health care provider. Document Released: 03/12/2009 Document Revised: 10/22/2015 Document Reviewed: 06/20/2014 Elsevier Interactive Patient Education  2017 [employer].

## 2020-04-06 NOTE — Progress Notes (Signed)
Subjective:   Audrey Martinez is a 73 y.o. female who presents for Medicare Annual (Subsequent) preventive examination.  Review of Systems     Cardiac Risk Factors include: advanced age (>20men, >44 women);sedentary lifestyle;hypertension;dyslipidemia;obesity (BMI >30kg/m2)     Objective:    Today's Vitals   04/06/20 0828  BP: 122/84  Pulse: 78  Resp: 16  Temp: 98.1 F (36.7 C)  TempSrc: Oral  SpO2: 95%  Weight: 235 lb 9.6 oz (106.9 kg)  Height: 5\' 8"  (1.727 m)   Body mass index is 35.82 kg/m.  Advanced Directives 04/06/2020 01/26/2020 05/02/2019 01/28/2019 01/01/2019 11/23/2018 10/24/2018  Does Patient Have a Medical Advance Directive? Yes Yes Yes Yes No No No  Type of Paramedic of [place];Living will [place];Living will Out of facility DNR (pink MOST or yellow form) [place];Living will - - -  Does patient want to make changes to medical advance directive? - Yes (ED - Information included in AVS) No - Patient declined - - - -  Copy of [place] in Chart? No - copy requested - - No - copy requested - - -  Would patient like information on creating a medical advance directive? - - No - Patient declined - - - -    Current Medications (verified) Outpatient Encounter Medications as of 04/06/2020  Medication Sig  . baclofen (LIORESAL) 10 MG tablet TAKE 1 TABLET BY MOUTH THREE TIMES A DAY (Patient taking differently: Take 10 mg by mouth 3 (three) times daily. )  . Bioflavonoid Products (ESTER-C) TABS Take 1 tablet by mouth 2 (two) times daily.   . calcium carbonate (OS-CAL - DOSED IN MG OF ELEMENTAL CALCIUM) 1250 (500 Ca) MG tablet Take 1 tablet by mouth.  . cholecalciferol (VITAMIN D) 25 MCG (1000 UT) tablet Take 1,000 Units by mouth daily.  . Cranberry 1000 MG CAPS Take 1,000 mg by mouth daily.   Marland Kitchen ezetimibe (ZETIA) 10 MG tablet TAKE 1 TABLET BY MOUTH EVERY DAY (Patient taking differently: Take  10 mg by mouth daily. )  . fluticasone (FLOVENT HFA) 220 MCG/ACT inhaler Inhale into the lungs 2 (two) times daily.  Marland Kitchen gabapentin (NEURONTIN) 300 MG capsule TAKE 1 CAPSULE (300 MG TOTAL) BY MOUTH 3 (THREE) TIMES DAILY. THREE CAPSULES DAILY AT BEDTIME. (Patient taking differently: Take 300 mg by mouth 3 (three) times daily as needed (nerve pain.). )  . Ginger, Zingiber officinalis, (GINGER PO) Take 1,100 mg by mouth daily.   Marland Kitchen losartan (COZAAR) 25 MG tablet Take 12.5 mg by mouth daily.  Marland Kitchen MAG ASPART-POTASSIUM ASPART PO Take 1 tablet by mouth 2 (two) times daily. 600 mg-198 mg  . metoprolol tartrate (LOPRESSOR) 25 MG tablet Take 1 tablet (25 mg total) by mouth 2 (two) times daily.  . midodrine (PROAMATINE) 2.5 MG tablet TAKE 1 TABLET (2.5 MG TOTAL) BY MOUTH 3 (THREE) TIMES DAILY WITH MEALS.  Marland Kitchen Misc Natural Products (TART CHERRY ADVANCED PO) Take 1 capsule by mouth daily.   . montelukast (SINGULAIR) 10 MG tablet Take 10 mg by mouth every evening.  . Multiple Vitamins-Minerals (MULTIVITAMIN WITH MINERALS) tablet Take 1 tablet by mouth daily. Centrum Silver  . Olodaterol HCl (STRIVERDI RESPIMAT) 2.5 MCG/ACT AERS Inhale 2 puffs into the lungs daily. USE AT THE SAME TIME EVERYDAY  . Probiotic Product (PROBIOTIC DAILY PO) Take 1 capsule by mouth 2 (two) times a week.   . rivaroxaban (XARELTO) 20 MG TABS tablet Take 1 tablet (  20 mg total) by mouth daily with supper.  . Turmeric 500 MG CAPS Take 500 mg by mouth daily.   Marland Kitchen zinc sulfate 220 (50 Zn) MG capsule Take 1 capsule (220 mg total) by mouth daily.  Marland Kitchen albuterol (PROVENTIL HFA;VENTOLIN HFA) 108 (90 Base) MCG/ACT inhaler Inhale 1-2 puffs into the lungs every 6 (six) hours as needed for wheezing or shortness of breath.  (Patient not taking: Reported on 04/06/2020)  . B Complex-C (B-COMPLEX WITH VITAMIN C) tablet Take 1 tablet by mouth daily. (Patient not taking: Reported on 04/06/2020)  . [DISCONTINUED] Calcium Carb-Cholecalciferol (CALCIUM 1000 + D PO) Take  1 tablet by mouth daily.  . [DISCONTINUED] dexamethasone (DECADRON) 6 MG tablet Take 1 tablet (6 mg total) by mouth daily.  . [DISCONTINUED] dextromethorphan-guaiFENesin (MUCINEX DM) 30-600 MG 12hr tablet Take 1 tablet by mouth 2 (two) times daily. (Patient taking differently: Take 1 tablet by mouth as needed. )  . [DISCONTINUED] guaiFENesin-dextromethorphan (ROBITUSSIN DM) 100-10 MG/5ML syrup Take 10 mLs by mouth every 4 (four) hours as needed for cough. (Patient not taking: Reported on 03/13/2020)  . [DISCONTINUED] ondansetron (ZOFRAN) 4 MG tablet Take 1 tablet (4 mg total) by mouth every 6 (six) hours as needed for nausea.  . [DISCONTINUED] senna (SENOKOT) 8.6 MG TABS tablet Take 1 tablet (8.6 mg total) by mouth 2 (two) times daily. (Patient not taking: Reported on 03/13/2020)  . [DISCONTINUED] traMADol (ULTRAM) 50 MG tablet Take 1 tablet (50 mg total) by mouth every 8 (eight) hours as needed for moderate pain. (Patient not taking: Reported on 03/13/2020)   No facility-administered encounter medications on file as of 04/06/2020.    Allergies (verified) Amoxicillin, Aspirin, Ciprofloxacin, Ciprofloxacin-dexamethasone, Crestor [rosuvastatin], Morphine and related, Doxycycline hyclate, Hydrochlorothiazide, Keflex [cephalexin], Lovastatin, Simvastatin, Erythromycin, Ibuprofen, Neomycin-bacitracin zn-polymyx, Omeprazole, and Tape   History: Past Medical History:  Diagnosis Date  . Actinic keratosis   . Amaurosis fugax, right eye 12/2018  . Arthritis   . Asthma   . Basal cell carcinoma 07/2016   central forehead (tx in [place])  . Cardiomyopathy ([place])    a. 04/2018 Echo: EF 45-50%. Anteroseptal and apical HK in some views. Mild AI/MR. Nl RV fxn; b. 05/2018 MV: mid-dist ant and antsept mild defect - ? breast atten. EF 41%. No ischemia; c. 03/2019 Echo: EF 30-35%, Gr2 DD, Trace MR, triv TR. Asc Ao ectatic dil - 86mm.   Marland Kitchen GERD (gastroesophageal reflux disease)   . Herniated disc, cervical   .  Hyperlipidemia   . Left wrist fracture   . Osteoporosis   . Stroke ([place]) 1998  . SVT (supraventricular tachycardia) ([place])    a.2008 s/p RFCA for AVNRT; b. 9 & 02/2019 Zio x 2: 1. Avg HR 89, 3 runs NSVT (longest 7 beats), [place] 1. 2. Avg HR 89, NSVT x 1 (5 beats), Mobitz 1. No signif arrhythmia.   Past Surgical History:  Procedure Laterality Date  . BACK SURGERY  2017   L2-4 laminectomy and foraminal stenosis  . BREAST CYST ASPIRATION Left 1977  . CARDIAC CATHETERIZATION  2009   [facility] in [place]  . CARDIAC ELECTROPHYSIOLOGY STUDY AND ABLATION  2009  . CATARACT EXTRACTION, BILATERAL  2010  . COLONOSCOPY WITH PROPOFOL N/A 03/21/2018   Procedure: COLONOSCOPY WITH Biopsy;  Surgeon: Lin Landsman, MD;  Location: [facility];  Service: Endoscopy;  Laterality: N/A;  . derrick procedure  1977-78   bilateral   . ESOPHAGOGASTRODUODENOSCOPY  2013   gastritis; done for complaint of  dysphagia  . ESOPHAGOGASTRODUODENOSCOPY (EGD) WITH PROPOFOL N/A 03/21/2018   Procedure: ESOPHAGOGASTRODUODENOSCOPY (EGD) WITH Biopsies;  Surgeon: Lin Landsman, MD;  Location: [facility];  Service: Endoscopy;  Laterality: N/A;  KEEP THIS PATIENT FIRST  . LEFT HEART CATH AND CORONARY ANGIOGRAPHY Left 05/02/2019   Procedure: LEFT HEART CATH AND CORONARY ANGIOGRAPHY;  Surgeon: Minna Merritts, MD;  Location: [facility];  Service: Cardiovascular;  Laterality: Left;  . lymph node removal     neck  . MOHS SURGERY  07/2016   BCCA nose  . POLYPECTOMY N/A 03/21/2018   Procedure: POLYPECTOMY;  Surgeon: Lin Landsman, MD;  Location: [facility];  Service: Endoscopy;  Laterality: N/A;  . REPLACEMENT TOTAL KNEE  2013   RT  . TOTAL SHOULDER REPLACEMENT  2012   RT    Family History  Problem Relation Age of Onset  . Diabetes Mother   . Stroke Mother   . Valvular heart disease Father   . CAD Sister 50       died of MI  . Stroke Sister   .  Stroke Sister 38  . Clotting disorder Half-Sister   . Clotting disorder Half-Sister   . Breast cancer Neg Hx    Social History   Socioeconomic History  . Marital status: Widowed    Spouse name: Not on file  . Number of children: 3  . Years of education: Not on file  . Highest education level: Not on file  Occupational History  . Occupation: retired  Tobacco Use  . Smoking status: Never Smoker  . Smokeless tobacco: Never Used  Vaping Use  . Vaping Use: Never used  Substance and Sexual Activity  . Alcohol use: Not Currently    Comment: may drink 1-2x/yr  . Drug use: No  . Sexual activity: Never  Other Topics Concern  . Not on file  Social History Narrative   Lives at home alone, has resources if help is needed   Social Determinants of Health   Financial Resource Strain: Low Risk   . Difficulty of Paying Living Expenses: Not hard at all  Food Insecurity: No Food Insecurity  . Worried About Charity fundraiser in the Last Year: Never true  . Ran Out of Food in the Last Year: Never true  Transportation Needs: No Transportation Needs  . Lack of Transportation (Medical): No  . Lack of Transportation (Non-Medical): No  Physical Activity: Inactive  . Days of Exercise per Week: 0 days  . Minutes of Exercise per Session: 0 min  Stress: No Stress Concern Present  . Feeling of Stress : Not at all  Social Connections: Moderately Integrated  . Frequency of Communication with Friends and Family: More than three times a week  . Frequency of Social Gatherings with Friends and Family: Three times a week  . Attends Religious Services: More than 4 times per year  . Active Member of Clubs or Organizations: No  . Attends Archivist Meetings: Never  . Marital Status: Married    Tobacco Counseling Counseling given: Not Answered   Clinical Intake:  Pre-visit preparation completed: Yes  Pain : No/denies pain     Nutritional Risks: None Diabetes: No  How often do  you need to have someone help you when you read instructions, pamphlets, or other written materials from your doctor or pharmacy?: 1 - Never    Interpreter Needed?: No  Information entered by :: Clemetine Marker LPN   Activities of  Daily Living In your present state of health, do you have any difficulty performing the following activities: 04/06/2020 01/26/2020  Hearing? N N  Comment declines hearing aids -  Vision? N N  Difficulty concentrating or making decisions? N N  Walking or climbing stairs? Y N  Dressing or bathing? N N  Doing errands, shopping? N N  Preparing Food and eating ? N -  Using the Toilet? N -  In the past six months, have you accidently leaked urine? N -  Do you have problems with loss of bowel control? N -  Managing your Medications? N -  Managing your Finances? N -  Housekeeping or managing your Housekeeping? N -  Some recent data might be hidden    Patient Care Team: Glean Hess, MD as PCP - General (Internal Medicine) Minna Merritts, MD as PCP - Cardiology (Cardiology) Flora Lipps, MD as Consulting Physician (Pulmonary Disease) Gillis Santa, MD as Consulting Physician (Pain Medicine) Earnestine Leys, MD (Orthopedic Surgery) Minna Merritts, MD as Consulting Physician (Cardiology) Brendolyn Patty, MD (Dermatology)  Indicate any recent Medical Services you may have received from other than [facility] providers in the past year (date may be approximate).     Assessment:   This is a routine wellness examination for Audrey Martinez.  Hearing/Vision screen  Hearing Screening   125Hz  250Hz  500Hz  1000Hz  2000Hz  3000Hz  4000Hz  6000Hz  8000Hz   Right ear:           Left ear:           Comments: Pt denies hearing difficulty  Vision Screening Comments: [facility] screen[facility] Dr. Edison Pace  Dietary issues and exercise activities discussed: Current Exercise Habits: The patient does not participate in regular exercise at present, Exercise limited  by: respiratory conditions(s)  Goals    . Patient Stated     Pt is leading women's Bible Study and would like to continue in a Godly way      Depression Screen PHQ 2/9 Scores 04/06/2020 11/25/2019 10/08/2019 01/28/2019 11/15/2018 11/08/2017 07/04/2017  PHQ - 2 Score 0 0 0 0 0 0 0  PHQ- 9 Score - 0 0 - - - -    Fall Risk Fall Risk  04/06/2020 11/25/2019 10/08/2019 01/28/2019 11/15/2018  Falls in the past year? 0 0 0 0 0  Number falls in past yr: 0 0 0 0 0  Injury with Fall? 0 0 0 0 0  Risk for fall due to : No Fall Risks No Fall Risks No Fall Risks Impaired vision -  Follow up Falls prevention discussed Falls evaluation completed Falls evaluation completed Falls prevention discussed Falls evaluation completed    Any stairs in or around the home? No  If so, are there any without handrails? No  Home free of loose throw rugs in walkways, pet beds, electrical cords, etc? Yes  Adequate lighting in your home to reduce risk of falls? Yes   ASSISTIVE DEVICES UTILIZED TO PREVENT FALLS:  Life alert? No  Use of a cane, walker or w/c? No  Grab bars in the bathroom? Yes  Shower chair or bench in shower? No  Elevated toilet seat or a handicapped toilet? No   TIMED UP AND GO:  Was the test performed? Yes .  Length of time to ambulate 10 feet: 5 sec.   Gait steady and fast without use of assistive device  Cognitive Function:     6CIT Screen 04/06/2020 01/28/2019  What Year? 0 points 0  points  What month? 0 points 0 points  What time? 0 points 0 points  Count back from 20 0 points 0 points  Months in reverse 0 points 2 points  Repeat phrase 0 points 0 points  Total Score 0 2    Immunizations Immunization History  Administered Date(s) Administered  . Pneumococcal Conjugate-13 07/22/2015  . Pneumococcal Polysaccharide-23 07/22/2013  . Td 03/03/2002    TDAP status: Due, Education has been provided regarding the importance of this vaccine. Advised may receive this vaccine at local pharmacy  or Health Dept. Aware to provide a copy of the vaccination record if obtained from local pharmacy or Health Dept. Verbalized acceptance and understanding.   Flu Vaccine status: Declined, Education has been provided regarding the importance of this vaccine but patient still declined. Advised may receive this vaccine at local pharmacy or Health Dept. Aware to provide a copy of the vaccination record if obtained from local pharmacy or Health Dept. Verbalized acceptance and understanding.   Pneumococcal vaccine status: Up to date   Covid-19 vaccine status: Declined, Education has been provided regarding the importance of this vaccine but patient still declined. Advised may receive this vaccine at local pharmacy or Health Dept.or vaccine clinic. Aware to provide a copy of the vaccination record if obtained from local pharmacy or Health Dept. Verbalized acceptance and understanding.  Qualifies for Shingles Vaccine? Yes   Zostavax completed No   Shingrix Completed?: No.    Education has been provided regarding the importance of this vaccine. Patient has been advised to call insurance company to determine out of pocket expense if they have not yet received this vaccine. Advised may also receive vaccine at local pharmacy or Health Dept. Verbalized acceptance and understanding.  Screening Tests Health Maintenance  Topic Date Due  . COVID-19 Vaccine (1) Never done  . INFLUENZA VACCINE  08/27/2020 (Originally 12/29/2019)  . TETANUS/TDAP  11/24/2020 (Originally 03/03/2012)  . MAMMOGRAM  06/03/2020  . COLONOSCOPY  03/21/2021  . DEXA SCAN  Completed  . Hepatitis C Screening  Completed  . PNA vac Low Risk Adult  Completed    Health Maintenance  Health Maintenance Due  Topic Date Due  . COVID-19 Vaccine (1) Never done    Colorectal cancer screening: Completed 03/21/18. Repeat every 3 years   Mammogram status: Completed 06/04/19. Repeat every year   Bone Density status: Completed 02/26/19. Results  reflect: Bone density results: OSTEOPOROSIS. Repeat every 2 years.  Lung Cancer Screening: (Low Dose CT Chest recommended if Age 72-80 years, 30 pack-year currently smoking OR have quit w/in 15years.) does not qualify.   Additional Screening:  Hepatitis C Screening: does qualify; Completed 11/15/18  Vision Screening: Recommended annual ophthalmology exams for early detection of glaucoma and other disorders of the eye. Is the patient up to date with their annual eye exam?  Yes  Who is the provider or what is the name of the office in which the patient attends annual eye exams? [place] Screening: Recommended annual dental exams for proper oral hygiene  Community Resource Referral / Chronic Care Management: CRR required this visit?  No   CCM required this visit?  No      Plan:     I have personally reviewed and noted the following in the patient's chart:   . Medical and social history . Use of alcohol, tobacco or illicit drugs  . Current medications and supplements . Functional ability and status . Nutritional status . Physical activity . Advanced  directives . List of other physicians . Hospitalizations, surgeries, and ER visits in previous 12 months . Vitals . Screenings to include cognitive, depression, and falls . Referrals and appointments  In addition, I have reviewed and discussed with patient certain preventive protocols, quality metrics, and best practice recommendations. A written personalized care plan for preventive services as well as general preventive health recommendations were provided to patient.     Clemetine Marker, LPN   [id number]   Nurse Notes: pt c/o hemorrhoid bleeding and concerned that xarelto may be causing this. Pt plans to contact Dr. Marius Ditch about this issue. Pt states bleeding only occurs when she has a bowel movement.

## 2020-04-08 DIAGNOSIS — G453 Amaurosis fugax: Secondary | ICD-10-CM | POA: Diagnosis not present

## 2020-04-11 ENCOUNTER — Other Ambulatory Visit: Payer: Self-pay | Admitting: Cardiovascular Disease

## 2020-04-11 ENCOUNTER — Other Ambulatory Visit: Payer: Self-pay | Admitting: Internal Medicine

## 2020-04-11 DIAGNOSIS — M79601 Pain in right arm: Secondary | ICD-10-CM

## 2020-04-11 NOTE — Telephone Encounter (Signed)
Requested Prescriptions  Pending Prescriptions Disp Refills   gabapentin (NEURONTIN) 300 MG capsule [Pharmacy Med Name: GABAPENTIN 300 MG CAPSULE] 540 capsule 1    Sig: TAKE 1 CAPSULE BY MOUTH 3 TIMES DAILY. AND TAKE 3 CAPSULES DAILY AT BEDTIME.     Neurology: Anticonvulsants - gabapentin Passed - 04/11/2020 10:42 AM      Passed - Valid encounter within last 12 months    Recent Outpatient Visits          4 weeks ago Diarrhea, unspecified type   [facility] Glean Hess, MD   4 months ago Essential hypertension   [place], [place]   6 months ago Pain of right thigh   [facility] Glean Hess, MD   1 year ago Essential hypertension   [facility] Glean Hess, MD   1 year ago Acute non-recurrent maxillary sinusitis   [facility] Glean Hess, MD      Future Appointments            In 3 weeks [place] Mel[place] Jesse Sans, MD [facility], [place]   In 2 months [place], Kathlene November, MD [facility]   In 7 months [place] Mel[place], Jesse Sans, MD [facility], [facility]

## 2020-05-06 ENCOUNTER — Encounter: Payer: Self-pay | Admitting: Internal Medicine

## 2020-05-06 ENCOUNTER — Telehealth: Payer: Self-pay | Admitting: Cardiovascular Disease

## 2020-05-06 ENCOUNTER — Other Ambulatory Visit: Payer: Self-pay

## 2020-05-06 ENCOUNTER — Ambulatory Visit (INDEPENDENT_AMBULATORY_CARE_PROVIDER_SITE_OTHER): Payer: Medicare Other | Admitting: Internal Medicine

## 2020-05-06 VITALS — BP 132/82 | HR 85 | Temp 98.6°F | Ht 68.0 in | Wt 238.0 lb

## 2020-05-06 DIAGNOSIS — I10 Essential (primary) hypertension: Secondary | ICD-10-CM

## 2020-05-06 DIAGNOSIS — I2699 Other pulmonary embolism without acute cor pulmonale: Secondary | ICD-10-CM

## 2020-05-06 DIAGNOSIS — J449 Chronic obstructive pulmonary disease, unspecified: Secondary | ICD-10-CM

## 2020-05-06 NOTE — Progress Notes (Signed)
Date:  05/06/2020   Name:  Audrey Martinez   DOB:  12/09/1945   MRN:  [id number]   Chief Complaint: Hypertension (f/u)  Hypertension This is a chronic problem. The problem is controlled. Pertinent negatives include no chest pain, headaches, palpitations or shortness of breath. Past treatments include angiotensin blockers and beta blockers (she was also started on midodrine during her hospital stay). The current treatment provides significant improvement. There are no compliance problems.   She is not certain that she is taking Cardizem 180 mg daily but she does have the short acting to take as needed. At discharge, she was told to stop Coreg, losartan and Cardiazem.  However she has continued to take Coreg 3.125 bid, losartan 12.5 mg daily and midodrine plus metoprolol.  She is not due to see Dr Rockey Situ for several months.  PE - developed during admission for Covid-19.  She is on xarelto daily without any bleeding issues.  She has recovered nicely and has resumed her usual activities.   COPD - she has not seen pulmonary in a while.  Using Flovent but wonders if the dose needs to be changed /decreased.  She rarely needs her albuterol nebulizer. Diarrhea - post covid hospital stay has now resolved.  She is consuming her normal diet and stool is of normal consistency.  Lab Results  Component Value Date   CREATININE 0.97 02/01/2020   BUN 24 (H) 02/01/2020   NA 139 02/01/2020   K 4.4 02/01/2020   CL 108 02/01/2020   CO2 22 02/01/2020   Lab Results  Component Value Date   CHOL 113 01/30/2020   HDL 40 (L) 01/30/2020   LDLCALC 68 01/30/2020   TRIG 27 01/30/2020   CHOLHDL 2.8 01/30/2020   Lab Results  Component Value Date   TSH 0.552 01/27/2020   No results found for: HGBA1C Lab Results  Component Value Date   WBC 16.0 (H) 02/01/2020   HGB 15.1 (H) 02/01/2020   HCT 43.8 02/01/2020   MCV 89.6 02/01/2020   PLT 311 02/01/2020   Lab Results  Component Value Date   ALT 82 (H)  02/01/2020   AST 41 02/01/2020   ALKPHOS 58 02/01/2020   BILITOT 1.0 02/01/2020     Review of Systems  Constitutional: Negative for chills, fatigue and fever.  HENT: Negative for nosebleeds.   Respiratory: Negative for cough, chest tightness and shortness of breath.   Cardiovascular: Negative for chest pain, palpitations and leg swelling.  Gastrointestinal: Negative for anal bleeding, constipation and diarrhea.  Genitourinary: Negative for hematuria.  Musculoskeletal: Positive for arthralgias and back pain.  Neurological: Negative for dizziness and headaches.  Psychiatric/Behavioral: Negative for dysphoric mood and sleep disturbance. The patient is not nervous/anxious.     Patient Active Problem List   Diagnosis Date Noted  . Pulmonary embolus ([place]) 01/31/2020  . Atrial flutter ([place]) 01/31/2020  . Obesity (BMI 30-39.9) 01/31/2020  . Left lower lobe pulmonary nodule 01/29/2020  . Chronic obstructive pulmonary disease, unspecified COPD type ([place]) 10/08/2019  . Dilated cardiomyopathy ([place]) 05/02/2019  . Amaurosis fugax of right eye 01/13/2019  . Arm pain, right 11/08/2017  . Renal insufficiency 11/08/2017  . Tendinitis of right knee 07/03/2017  . Lumbar radiculopathy 05/18/2017  . Status post hemilaminotomy 05/18/2017  . Lumbar degenerative disc disease 05/18/2017  . Spinal stenosis of lumbar region without neurogenic claudication 05/18/2017  . Atrial tachycardia ([place]) 04/14/2017  . SVT (supraventricular tachycardia) ([place]) 04/14/2017  . Essential hypertension 04/14/2017  .  Intermittent asthma without complication 60/45/4098  . Osteoporosis 03/21/2017  . Benign essential tremor 03/21/2017  . Hx of atrioventricular node ablation 02/01/2008  . Hyperlipidemia 05/12/2003  . Personal history of transient ischemic attack (TIA), and cerebral infarction without residual deficits 04/22/2003    Allergies  Allergen Reactions  . Amoxicillin Hives, Shortness Of Breath and Swelling     Did it involve swelling of the face/tongue/throat, SOB, or low BP? Yes Did it involve sudden or severe rash/hives, skin peeling, or any reaction on the inside of your mouth or nose? Yes Did you need to seek medical attention at a hospital or doctor's office? Yes When did it last happen?1998 If all above answers are "NO", may proceed with cephalosporin use.   . Aspirin Anaphylaxis  . Ciprofloxacin Rash  . Ciprofloxacin-Dexamethasone Anaphylaxis  . Crestor [Rosuvastatin] Other (See Comments)    Caused problems with memory.  . Morphine And Related Nausea And Vomiting  . Doxycycline Hyclate Hives  . Hydrochlorothiazide Other (See Comments)    Raises her blood pressure.   Marland Kitchen Keflex [Cephalexin] Hives  . Lovastatin Other (See Comments)    "Unable to function"  . Simvastatin Other (See Comments)    "Unable to function"  . Erythromycin Hives  . Ibuprofen Other (See Comments)    Sharp stomach pains  . Neomycin-Bacitracin Zn-Polymyx Hives and Rash  . Omeprazole Palpitations    Tachycardia and same sensation as her SVTs per patient  . Tape Hives and Rash    Past Surgical History:  Procedure Laterality Date  . BACK SURGERY  2017   L2-4 laminectomy and foraminal stenosis  . BREAST CYST ASPIRATION Left 1977  . CARDIAC CATHETERIZATION  2009   [facility] in [place]  . CARDIAC ELECTROPHYSIOLOGY STUDY AND ABLATION  2009  . CATARACT EXTRACTION, BILATERAL  2010  . COLONOSCOPY WITH PROPOFOL N/A 03/21/2018   Procedure: COLONOSCOPY WITH Biopsy;  Surgeon: Lin Landsman, MD;  Location: [facility];  Service: Endoscopy;  Laterality: N/A;  . derrick procedure  1977-78   bilateral   . ESOPHAGOGASTRODUODENOSCOPY  2013   gastritis; done for complaint of dysphagia  . ESOPHAGOGASTRODUODENOSCOPY (EGD) WITH PROPOFOL N/A 03/21/2018   Procedure: ESOPHAGOGASTRODUODENOSCOPY (EGD) WITH Biopsies;  Surgeon: Lin Landsman, MD;  Location: [facility];   Service: Endoscopy;  Laterality: N/A;  KEEP THIS PATIENT FIRST  . LEFT HEART CATH AND CORONARY ANGIOGRAPHY Left 05/02/2019   Procedure: LEFT HEART CATH AND CORONARY ANGIOGRAPHY;  Surgeon: Minna Merritts, MD;  Location: [facility];  Service: Cardiovascular;  Laterality: Left;  . lymph node removal     neck  . MOHS SURGERY  07/2016   BCCA nose  . POLYPECTOMY N/A 03/21/2018   Procedure: POLYPECTOMY;  Surgeon: Lin Landsman, MD;  Location: [facility];  Service: Endoscopy;  Laterality: N/A;  . REPLACEMENT TOTAL KNEE  2013   RT  . TOTAL SHOULDER REPLACEMENT  2012   RT     Social History   Tobacco Use  . Smoking status: Never Smoker  . Smokeless tobacco: Never Used  Vaping Use  . Vaping Use: Never used  Substance Use Topics  . Alcohol use: Not Currently    Comment: may drink 1-2x/yr  . Drug use: No     Medication list has been reviewed and updated.  Current Meds  Medication Sig  . albuterol (PROVENTIL HFA;VENTOLIN HFA) 108 (90 Base) MCG/ACT inhaler Inhale 1-2 puffs into the lungs every 6 (six) hours as needed  for wheezing or shortness of breath.   . B Complex-C (B-COMPLEX WITH VITAMIN C) tablet Take 1 tablet by mouth daily.   . baclofen (LIORESAL) 10 MG tablet TAKE 1 TABLET BY MOUTH THREE TIMES A DAY (Patient taking differently: Take 10 mg by mouth 3 (three) times daily. )  . Bioflavonoid Products (ESTER-C) TABS Take 1 tablet by mouth 2 (two) times daily.   . calcium carbonate (OS-CAL - DOSED IN MG OF ELEMENTAL CALCIUM) 1250 (500 Ca) MG tablet Take 1 tablet by mouth.  . cholecalciferol (VITAMIN D) 25 MCG (1000 UT) tablet Take 1,000 Units by mouth daily.  . Cranberry 1000 MG CAPS Take 1,000 mg by mouth daily.   Marland Kitchen ezetimibe (ZETIA) 10 MG tablet TAKE 1 TABLET BY MOUTH EVERY DAY (Patient taking differently: Take 10 mg by mouth daily. )  . fluticasone (FLOVENT HFA) 220 MCG/ACT inhaler Inhale into the lungs 2 (two) times daily.  Marland Kitchen gabapentin (NEURONTIN) 300 MG  capsule TAKE 1 CAPSULE BY MOUTH 3 TIMES DAILY. AND TAKE 3 CAPSULES DAILY AT BEDTIME.  . Ginger, Zingiber officinalis, (GINGER PO) Take 1,100 mg by mouth daily.   Marland Kitchen losartan (COZAAR) 25 MG tablet Take 12.5 mg by mouth daily.  Marland Kitchen MAG ASPART-POTASSIUM ASPART PO Take 1 tablet by mouth 2 (two) times daily. 600 mg-198 mg  . metoprolol tartrate (LOPRESSOR) 25 MG tablet Take 1 tablet (25 mg total) by mouth 2 (two) times daily.  . midodrine (PROAMATINE) 2.5 MG tablet TAKE 1 TABLET (2.5 MG TOTAL) BY MOUTH 3 (THREE) TIMES DAILY WITH MEALS.  Marland Kitchen Misc Natural Products (TART CHERRY ADVANCED PO) Take 1 capsule by mouth daily.   . montelukast (SINGULAIR) 10 MG tablet Take 10 mg by mouth every evening.  . Multiple Vitamins-Minerals (MULTIVITAMIN WITH MINERALS) tablet Take 1 tablet by mouth daily. Centrum Silver  . Olodaterol HCl (STRIVERDI RESPIMAT) 2.5 MCG/ACT AERS Inhale 2 puffs into the lungs daily. USE AT THE SAME TIME EVERYDAY  . Probiotic Product (PROBIOTIC DAILY PO) Take 1 capsule by mouth 2 (two) times a week.   . rivaroxaban (XARELTO) 20 MG TABS tablet Take 1 tablet (20 mg total) by mouth daily with supper.  . Turmeric 500 MG CAPS Take 500 mg by mouth daily.   Marland Kitchen zinc sulfate 220 (50 Zn) MG capsule Take 1 capsule (220 mg total) by mouth daily.    PHQ 2/9 Scores 05/06/2020 04/06/2020 11/25/2019 10/08/2019  PHQ - 2 Score 0 0 0 0  PHQ- 9 Score 0 - 0 0    GAD 7 : Generalized Anxiety Score 05/06/2020 11/25/2019  Nervous, Anxious, on Edge 0 0  Control/stop worrying 0 0  Worry too much - different things 0 0  Trouble relaxing 0 0  Restless 0 0  Easily annoyed or irritable 0 0  Afraid - awful might happen 0 0  Total GAD 7 Score 0 0  Anxiety Difficulty - Not difficult at all    BP Readings from Last 3 Encounters:  05/06/20 132/82  04/06/20 122/84  03/13/20 126/78    Physical Exam Vitals and nursing note reviewed.  Constitutional:      General: She is not in acute distress.    Appearance: Normal  appearance. She is well-developed.  HENT:     Head: Normocephalic and atraumatic.  Cardiovascular:     Rate and Rhythm: Normal rate and regular rhythm.  Pulmonary:     Effort: Pulmonary effort is normal. No respiratory distress.     Breath sounds: No wheezing  or rhonchi.  Musculoskeletal:     Cervical back: Normal range of motion.     Right lower leg: No edema.     Left lower leg: No edema.  Lymphadenopathy:     Cervical: No cervical adenopathy.  Skin:    General: Skin is warm and dry.     Findings: No rash.  Neurological:     General: No focal deficit present.     Mental Status: She is alert and oriented to person, place, and time.  Psychiatric:        Mood and Affect: Mood normal.     Wt Readings from Last 3 Encounters:  05/06/20 238 lb (108 kg)  04/06/20 235 lb 9.6 oz (106.9 kg)  03/13/20 226 lb (102.5 kg)    BP 132/82   Pulse 85   Temp 98.6 F (37 C) (Oral)   Ht 5\' 8"  (1.727 m)   Wt 238 lb (108 kg)   SpO2 95%   BMI 36.19 kg/m   Assessment and Plan: 1. Essential hypertension Pt will continue her current regimen - I have messaged Dr. Rockey Situ to review her chart and let her know what she should be taking. BP is well controlled today and she feels well but should probably not be on midodrine with three other agents  2. Pulmonary embolism without acute cor pulmonale, unspecified chronicity, unspecified pulmonary embolism type (HCC) Tolerating anticoagulation well without bleeding or other concerns Plan to continue for 6 months  3. Chronic obstructive pulmonary disease, unspecified COPD type ([place]) Continue Flovent bid; albuterol PRN Follow up with Pulmonary as planned   Partially dictated using Dragon software. Any errors are unintentional.  Halina Maidens, MD [facility]  05/06/2020

## 2020-05-06 NOTE — Telephone Encounter (Signed)
Left message to offer patient appointment tomorrow with Marrianne Mood, PA per Dr. Rockey Situ for medication confusion.

## 2020-05-12 ENCOUNTER — Encounter: Payer: Self-pay | Admitting: Dermatology

## 2020-05-12 ENCOUNTER — Ambulatory Visit (INDEPENDENT_AMBULATORY_CARE_PROVIDER_SITE_OTHER): Payer: Medicare Other | Admitting: Dermatology

## 2020-05-12 ENCOUNTER — Other Ambulatory Visit: Payer: Self-pay

## 2020-05-12 DIAGNOSIS — D1800 Hemangioma unspecified site: Secondary | ICD-10-CM | POA: Diagnosis not present

## 2020-05-12 DIAGNOSIS — L738 Other specified follicular disorders: Secondary | ICD-10-CM

## 2020-05-12 DIAGNOSIS — Z85828 Personal history of other malignant neoplasm of skin: Secondary | ICD-10-CM

## 2020-05-12 DIAGNOSIS — L820 Inflamed seborrheic keratosis: Secondary | ICD-10-CM | POA: Diagnosis not present

## 2020-05-12 DIAGNOSIS — D229 Melanocytic nevi, unspecified: Secondary | ICD-10-CM | POA: Diagnosis not present

## 2020-05-12 DIAGNOSIS — L821 Other seborrheic keratosis: Secondary | ICD-10-CM

## 2020-05-12 DIAGNOSIS — L578 Other skin changes due to chronic exposure to nonionizing radiation: Secondary | ICD-10-CM

## 2020-05-12 DIAGNOSIS — L814 Other melanin hyperpigmentation: Secondary | ICD-10-CM

## 2020-05-12 DIAGNOSIS — D034 Melanoma in situ of scalp and neck: Secondary | ICD-10-CM | POA: Diagnosis not present

## 2020-05-12 DIAGNOSIS — Z1283 Encounter for screening for malignant neoplasm of skin: Secondary | ICD-10-CM

## 2020-05-12 DIAGNOSIS — C439 Malignant melanoma of skin, unspecified: Secondary | ICD-10-CM

## 2020-05-12 DIAGNOSIS — D485 Neoplasm of uncertain behavior of skin: Secondary | ICD-10-CM

## 2020-05-12 HISTORY — DX: Malignant melanoma of skin, unspecified: C43.9

## 2020-05-12 NOTE — Patient Instructions (Signed)
Wound Care Instructions  1. Cleanse wound gently with soap and water once a day then pat dry with clean gauze. Apply a thing coat of Petrolatum (petroleum jelly, "Vaseline") over the wound (unless you have an allergy to this). We recommend that you use a new, sterile tube of Vaseline. Do not pick or remove scabs. Do not remove the yellow or white "healing tissue" from the base of the wound.  2. Cover the wound with fresh, clean, nonstick gauze and secure with paper tape. You may use Band-Aids in place of gauze and tape if the would is small enough, but would recommend trimming much of the tape off as there is often too much. Sometimes Band-Aids can irritate the skin.  3. You should call the office for your biopsy report after 1 week if you have not already been contacted.  4. If you experience any problems, such as abnormal amounts of bleeding, swelling, significant bruising, significant pain, or evidence of infection, please call the office immediately.  5. FOR ADULT SURGERY PATIENTS: If you need something for pain relief you may take 1 extra strength Tylenol (acetaminophen) AND 2 Ibuprofen (200mg  each) together every 4 hours as needed for pain. (do not take these if you are allergic to them or if you have a reason you should not take them.) Typically, you may only need pain medication for 1 to 3 days.      Recommend daily broad spectrum sunscreen SPF 30+ to sun-exposed areas, reapply every 2 hours as needed. Call for new or changing lesions.

## 2020-05-12 NOTE — Progress Notes (Signed)
Follow-Up Visit   Subjective  Audrey Martinez is a 74 y.o. female who presents for the following: Annual Exam (Hx of BCC - treated in [place] ).  In 2018.  She has had AKs, and generally gets a PDT treatment of her face once a year, which helps a lot.  Pt has a lesion under the left breast that she states has changed and gotten darker. She has several other growths on her scalp, temple, and arm that are irritated and that she picks at.   The following portions of the chart were reviewed this encounter and updated as appropriate:      Review of Systems: No other skin or systemic complaints except as noted in HPI or Assessment and Plan.   Objective  Well appearing patient in no apparent distress; mood and affect are within normal limits.  A full examination was performed including scalp, head, eyes, ears, nose, lips, neck, chest, axillae, abdomen, back, buttocks, bilateral upper extremities, bilateral lower extremities, hands, feet, fingers, toes, fingernails, and toenails. All findings within normal limits unless otherwise noted below.  Objective  right temple x 1, left forearm x 1 (2): Erythematous keratotic or waxy stuck-on papule  occiptal scalp x 1: Excoriated erythematous keratotic or waxy stuck-on papule or plaque.   Objective  face: Small yellow papules with a central dell.   Objective  left posterior neck: 6 mm speckled brown macule, darker center Lentigo vs SK r/o atypia       Assessment & Plan  Inflamed seborrheic keratosis (3) right temple x 1, left forearm x 1 (2); occiptal scalp x 1    Destruction of lesion - occiptal scalp x 1, right temple x 1, left forearm x 1  Destruction method: cryotherapy   Informed consent: discussed and consent obtained   Lesion destroyed using liquid nitrogen: Yes   Region frozen until ice ball extended beyond lesion: Yes   Outcome: patient tolerated procedure well with no complications   Post-procedure details: wound  care instructions given   Additional details:  Prior to procedure, discussed risks of blister formation, small wound, skin dyspigmentation, or rare scar following cryotherapy.  Sebaceous hyperplasia face  Benign, observe.    Neoplasm of uncertain behavior of skin left posterior neck  Epidermal / dermal shaving  Lesion diameter (cm):  0.8 Informed consent: discussed and consent obtained   Patient was prepped and draped in usual sterile fashion: Area prepped with alcohol. Anesthesia: the lesion was anesthetized in a standard fashion   Anesthetic:  1% lidocaine w/ epinephrine 1-100,000 buffered w/ 8.4% NaHCO3 Instrument used: flexible razor blade   Hemostasis achieved with: pressure, aluminum chloride and electrodesiccation   Outcome: patient tolerated procedure well   Post-procedure details: wound care instructions given   Post-procedure details comment:  Ointment and small bandage applied  Specimen 1 - Surgical pathology Differential Diagnosis: Lentigo vs SK r/o atypia  Check Margins: No 6 mm speckled brown macule, darker center   Lentigines - Scattered tan macules - Discussed due to sun exposure - Benign, observe - Call for any changes  Seborrheic Keratoses - Stuck-on, waxy, tan-brown papules and plaques  - Discussed benign etiology and prognosis. - Observe - Call for any changes  Melanocytic Nevi - Tan-brown and/or pink-flesh-colored symmetric macules and papules - Benign appearing on exam today - Observation - Call clinic for new or changing moles - Recommend daily use of broad spectrum spf 30+ sunscreen to sun-exposed areas.   Hemangiomas - Red papules, including L  inframammary - Discussed benign nature - Observe - Call for any changes  Actinic Damage - Severe, chronic, secondary to cumulative UV radiation exposure over time - diffuse scaly erythematous macules and papules with underlying dyspigmentation - Discussed Prescription "Field Treatment" for  Severe, Chronic Confluent Actinic Changes with Pre-Cancerous Actinic Keratoses Field treatment involves treatment of an entire area of skin that has confluent Actinic Changes (Sun/ Ultraviolet light damage) and PreCancerous Actinic Keratoses by method of PhotoDynamic Therapy (PDT) and/or prescription Topical Chemotherapy agents such as 5-fluorouracil, 5-fluorouracil/calcipotriene, and/or imiquimod.  The purpose is to decrease the number of clinically evident and subclinical PreCancerous lesions to prevent progression to development of skin cancer by chemically destroying early precancer changes that may or may not be visible.  It has been shown to reduce the risk of developing skin cancer in the treated area. As a result of treatment, redness, scaling, crusting, and open sores may occur during treatment course. One or more than one of these methods may be used and may have to be used several times to control, suppress and eliminate the PreCancerous changes. Discussed treatment course, expected reaction, and possible side effects. - Recommend daily broad spectrum sunscreen SPF 30+ to sun-exposed areas, reapply every 2 hours as needed.  - Call for new or changing lesions. - Will schedule photodynamic therapy to the face.  Skin cancer screening performed today.  History of Basal Cell Carcinoma of the Skin - No evidence of recurrence today forehead - Recommend regular full body skin exams - Recommend daily broad spectrum sunscreen SPF 30+ to sun-exposed areas, reapply every 2 hours as needed.  - Call if any new or changing lesions are noted between office visits  Return in about 1 year (around 05/12/2021) for TBSE, sooner for PDT for face.   I, Harriett Sine, CMA, am acting as scribe for Brendolyn Patty, MD.  Documentation: I have reviewed the above documentation for accuracy and completeness, and I agree with the above.  Brendolyn Patty MD

## 2020-05-12 NOTE — Telephone Encounter (Signed)
Patient declined any visits with APP .  She states she was told to only see Dr. Rockey Situ.    Patient scheduled for 1-10 and added to waitlist but would like Dr. Rockey Situ to consider overbooking sooner.    Please advise.

## 2020-05-18 ENCOUNTER — Telehealth: Payer: Self-pay

## 2020-05-18 NOTE — Telephone Encounter (Signed)
Please Review.  KP

## 2020-05-18 NOTE — Telephone Encounter (Signed)
She is not to stop the medication.  If she is having issues with it, she needs to discuss with Cardiology Dr. Rockey Situ.  Until she sees him, continue taking it as directed.

## 2020-05-18 NOTE — Telephone Encounter (Signed)
-----   Message from Brendolyn Patty, MD sent at 05/18/2020 10:06 AM EST ----- Skin , left posterior neck MELANOMA IN SITU, LATERAL MARGIN INVOLVED  Melanoma in situ- needs excision, spoke with patient regarding path results and treatment.  Please call pt to schedule.

## 2020-05-18 NOTE — Telephone Encounter (Signed)
Dr. Nicole Kindred discussed by results with pt.  I scheduled pt for 06/09/19 at 1:30 for excision of Melanoma IS./sh

## 2020-05-18 NOTE — Telephone Encounter (Signed)
Copied from [place] [id number]. Topic: General - Other >> May 18, 2020  2:05 PM Leward Quan A wrote: Reason for CRM:  Patient called in to inform Dr Army Melia that she would like to be taken off  rivaroxaban (XARELTO) 20 MG TABS tablet. Per patient she is concerned since loosing a sister that had blood clots due to this medication and other complications. Please call patient at   Ph# [phone]

## 2020-05-18 NOTE — Telephone Encounter (Signed)
Spoke to patient she will call cardiologist to schedule an appt. Pt is aware that she needs to continue taking medication until she hs a Drs. Appt.  KP

## 2020-05-18 NOTE — Telephone Encounter (Signed)
Please review. Pt stated that she was informed to finish the bottle and then stop taking it by Dr. Jacinto Reap. Pt stated that she went to the pharmacy and they told her that she should not stop taking it all together that she needs to gradually stop taken medication. Pt wants more pills sent in and wants to know instructions on how she should take medication.

## 2020-05-18 NOTE — Telephone Encounter (Signed)
This medication prevents blood clots - it does not cause them. I do not advise stopping it early.

## 2020-05-19 ENCOUNTER — Ambulatory Visit (INDEPENDENT_AMBULATORY_CARE_PROVIDER_SITE_OTHER): Payer: Medicare Other

## 2020-05-19 ENCOUNTER — Ambulatory Visit: Payer: Medicare Other | Admitting: Internal Medicine

## 2020-05-19 ENCOUNTER — Other Ambulatory Visit: Payer: Self-pay

## 2020-05-19 DIAGNOSIS — L570 Actinic keratosis: Secondary | ICD-10-CM

## 2020-05-19 MED ORDER — AMINOLEVULINIC ACID HCL 20 % EX SOLR
1.0000 "application " | Freq: Once | CUTANEOUS | Status: AC
  Administered 2020-05-19: 10:00:00 354 mg via TOPICAL

## 2020-05-19 NOTE — Patient Instructions (Signed)

## 2020-05-19 NOTE — Progress Notes (Signed)
1. AK (actinic keratosis) face  Photodynamic therapy - face Procedure discussed: discussed risks, benefits, side effects. and alternatives   Prep: site scrubbed/prepped with acetone   Location:  Face Number of lesions:  Multiple Type of treatment:  Blue light Aminolevulinic Acid (see MAR for details): Levulan Number of Levulan sticks used:  1 Incubation time (minutes):  60 Number of minutes under lamp:  16 Number of seconds under lamp:  40 Cooling:  Floor fan Outcome: patient tolerated procedure well with no complications   Post-procedure details: sunscreen applied and aftercare instructions given to patient    Aminolevulinic Acid HCl 20 % SOLR 354 mg - face

## 2020-05-28 ENCOUNTER — Encounter: Payer: Self-pay | Admitting: Emergency Medicine

## 2020-05-28 ENCOUNTER — Ambulatory Visit: Payer: Self-pay

## 2020-05-28 ENCOUNTER — Emergency Department
Admission: EM | Admit: 2020-05-28 | Discharge: 2020-05-28 | Disposition: A | Discharge status: Home / Self Care | Payer: Medicare Other | Attending: Emergency Medicine | Admitting: Emergency Medicine

## 2020-05-28 ENCOUNTER — Other Ambulatory Visit: Payer: Self-pay

## 2020-05-28 ENCOUNTER — Emergency Department: Payer: Medicare Other

## 2020-05-28 DIAGNOSIS — R200 Anesthesia of skin: Secondary | ICD-10-CM | POA: Diagnosis not present

## 2020-05-28 DIAGNOSIS — R202 Paresthesia of skin: Secondary | ICD-10-CM | POA: Diagnosis not present

## 2020-05-28 DIAGNOSIS — Z8673 Personal history of transient ischemic attack (TIA), and cerebral infarction without residual deficits: Secondary | ICD-10-CM | POA: Diagnosis not present

## 2020-05-28 DIAGNOSIS — Z5321 Procedure and treatment not carried out due to patient leaving prior to being seen by health care provider: Secondary | ICD-10-CM | POA: Insufficient documentation

## 2020-05-28 DIAGNOSIS — I672 Cerebral atherosclerosis: Secondary | ICD-10-CM | POA: Diagnosis not present

## 2020-05-28 LAB — COMPREHENSIVE METABOLIC PANEL
ALT: 13 U/L (ref 0–44)
AST: 18 U/L (ref 15–41)
Albumin: 4 g/dL (ref 3.5–5.0)
Alkaline Phosphatase: 57 U/L (ref 38–126)
Anion gap: 9 (ref 5–15)
BUN: 15 mg/dL (ref 8–23)
CO2: 28 mmol/L (ref 22–32)
Calcium: 9.7 mg/dL (ref 8.9–10.3)
Chloride: 104 mmol/L (ref 98–111)
Creatinine, Ser: 1.05 mg/dL — ABNORMAL HIGH (ref 0.44–1.00)
GFR, Estimated: 56 mL/min — ABNORMAL LOW (ref >60)
Glucose, Bld: 99 mg/dL (ref 70–99)
Potassium: 4.1 mmol/L (ref 3.5–5.1)
Sodium: 141 mmol/L (ref 135–145)
Total Bilirubin: 0.8 mg/dL (ref 0.3–1.2)
Total Protein: 6.8 g/dL (ref 6.5–8.1)

## 2020-05-28 LAB — DIFFERENTIAL
Abs Immature Granulocytes: 0.01 10*3/uL (ref 0.00–0.07)
Basophils Absolute: 0 10*3/uL (ref 0.0–0.1)
Basophils Relative: 0 %
Eosinophils Absolute: 0.2 10*3/uL (ref 0.0–0.5)
Eosinophils Relative: 2 %
Immature Granulocytes: 0 %
Lymphocytes Relative: 21 %
Lymphs Abs: 1.4 10*3/uL (ref 0.7–4.0)
Monocytes Absolute: 0.6 10*3/uL (ref 0.1–1.0)
Monocytes Relative: 10 %
Neutro Abs: 4.2 10*3/uL (ref 1.7–7.7)
Neutrophils Relative %: 67 %

## 2020-05-28 LAB — CBC
HCT: 43.4 % (ref 36.0–46.0)
Hemoglobin: 14.3 g/dL (ref 12.0–15.0)
MCH: 30.8 pg (ref 26.0–34.0)
MCHC: 32.9 g/dL (ref 30.0–36.0)
MCV: 93.5 fL (ref 80.0–100.0)
Platelets: 200 10*3/uL (ref 150–400)
RBC: 4.64 MIL/uL (ref 3.87–5.11)
RDW: 11.4 % — ABNORMAL LOW (ref 11.5–15.5)
WBC: 6.3 10*3/uL (ref 4.0–10.5)
nRBC: 0 % (ref 0.0–0.2)

## 2020-05-28 LAB — CBG MONITORING, ED: Glucose-Capillary: 86 mg/dL (ref 70–99)

## 2020-05-28 LAB — APTT: aPTT: 37 seconds — ABNORMAL HIGH (ref 24–36)

## 2020-05-28 LAB — PROTIME-INR
INR: 1.4 — ABNORMAL HIGH (ref 0.8–1.2)
Prothrombin Time: 17 seconds — ABNORMAL HIGH (ref 11.4–15.2)

## 2020-05-28 NOTE — Telephone Encounter (Signed)
Noted  Pt is going to go to ER for tingling on the left side of her face.  KP

## 2020-05-28 NOTE — ED Triage Notes (Signed)
Pt comes into the ED via POV c/o right side facial numbness.  Pt states the numbness started last night.  Pt denies any CP, dizziness, blurred vision, weakness, etc.  Pt ambulatory to triage at this time and in NAd.  Pt does have a h/o strokes in the past but does not have residual problems from it.  Pt also states she was having difficulty swallowing when the numbness was at its worst.  It has dissipated and the patient explains that it is intermittent.

## 2020-05-28 NOTE — Telephone Encounter (Signed)
Pt. Reports she has had tingling to right side of face x 2 days. No numbness. Smile is symmetrical.No other symptoms. Instructed to have someone take her to ED now. Verbalizes understanding.  Reason for Disposition  [1] Numbness (i.e., loss of sensation) of the face, arm / hand, or leg / foot on one side of the body AND [2] sudden onset AND [3] brief (now gone)  Answer Assessment - Initial Assessment Questions 1. SYMPTOM: "What is the main symptom you are concerned about?" (e.g., weakness, numbness)     Tingling right side of face, jaw and neck 2. ONSET: "When did this start?" (minutes, hours, days; while sleeping)     2 days ago 3. LAST NORMAL: "When was the last time you were normal (no symptoms)?"     2 days 4. PATTERN "Does this come and go, or has it been constant since it started?"  "Is it present now?"     Constant 5. CARDIAC SYMPTOMS: "Have you had any of the following symptoms: chest pain, difficulty breathing, palpitations?"     No 6. NEUROLOGIC SYMPTOMS: "Have you had any of the following symptoms: headache, dizziness, vision loss, double vision, changes in speech, unsteady on your feet?"     No 7. OTHER SYMPTOMS: "Do you have any other symptoms?"     No 8. PREGNANCY: "Is there any chance you are pregnant?" "When was your last menstrual period?"     No  Protocols used: NEUROLOGIC DEFICIT-A-AH

## 2020-05-28 NOTE — ED Triage Notes (Signed)
First RN note: Pt to ED via POV with c/o numbness to R side of face. Pt states symptoms that started day before yesterday. Pt A&O x4, no slurred speech noted at time of triage.

## 2020-06-05 ENCOUNTER — Other Ambulatory Visit: Payer: Self-pay | Admitting: Cardiovascular Disease

## 2020-06-07 NOTE — Progress Notes (Signed)
Cardiology Office Note  Date:  06/08/2020   ID:  Linzey, Lykins 1945-07-14, MRN [id number]  PCP:  Glean Hess, MD   Chief Complaint  Patient presents with   Other    Patient states she want to clarify her medication. Her PCP told her to stop Xarelto when she runs out.  Meds reviewed verbally with patient.      HPI:  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  Non-smoker  orthostasis Previous migraines Nonischemic cardiomyopathy ejection fraction 30 to 35% November 2020 Catheterization with nonobstructive coronary disease Ejection fraction remaining 30 to 35% in August 2021 Who presents for f/u of her atrial tachycardia, SVT, EF 30 to 35%, possible hypertensive cardiomyopathy, medication intolerances  Last seen in clinic by myself July 2021 Lives alone, son helps her with gardening She does her ADLs Slow trend up in the weight past summer  No significant leg swelling, fluid retention Had echocardiogram done August 2021 ejection fraction 30 to 35%, same as 2020, normal right heart pressures  Medication list indicates she is on carvedilol  Unclear reasons our office sent in prescriptions in September 2021 for metoprolol and midodrine (from hospital when she had covid?)  Long discussion concerning recent COVID infection  in sept 2021 Vaccinated In hospital, records reviewed Bilateral pulmonary emboli, without right heart strain. On xarelto 20  Has recovered, no SOB  Plan to stay active but no regular exercise program Denies any orthopnea, no leg swelling, no weight gain concerning for fluid retention  EKG personally reviewed by myself on todays visit NSR rate 74 bpm, no ST or T wave changes  Echocardiogram April 17, 2019  with ejection fraction 30 to 35%, normal RV  function  Cardiac catheterization performed for cardiomyopathy  Catheterization December 2020 Nonobstructive CAD, EF estimated at 30 to 35%, global hypokinesis,  LVEDP 17 to 20 mm Hg  episode of tachycardia, went to the ER 06/03/2018 Rhythm broke before she could get an EKG in the emergency room Rare episodes, does not remember the last time she had an episode   EKG personally reviewed by myself on todays visit Normal sinus rhythm rate 86 bpm no significant ST-T wave changes  Other past medical history reviewed  Right eye vision went dark, 01/02/2019 seen in the ER MRI in the ER, no CVA CT head Encephalomalacia changes in the left frontal region. Left eye: darkness "came down a bit"  zio monitor,  Normal sinus rhythm  avg HR of 89 bpm. 3 Ventricular Tachycardia runs occurred, the run with the fastest interval lasting 5 beats with a max rate of 200 bpm, the longest lasting 7 beats with an avg rate of 141 bpm.  Patient triggered events were not associated with significant arrhythmia.  Carotid: Minor carotid atherosclerosis. No hemodynamically significant ICA stenosis. Degree of narrowing less than 50% bilaterally by ultrasound criteria.   Echocardiogram May 18, 2018 Ejection fraction 45 to 50% mild diffuse hypokinesis unable to exclude regional wall motion abnormalities Concerning for anteroseptal and apical hypokinesis   Lab Results  Component Value Date   CHOL 113 01/30/2020   HDL 40 (L) 01/30/2020   LDLCALC 68 01/30/2020   TRIG 27 01/30/2020     Notes from [place] cardiologist Notes indicate 1. Dual AV nodal physiology. 2. Typical slow fast AV nodal reentrant tachycardia. 3. Nonsustained  AT / No inducible atrial flutter  She underwent s/p Successful radiofrequency ablation of AV nodal slow pathway.   She had recurrent symptoms shortly after, had event monitor  repeat EPS 01/21/08:  1. Markedly prolonged baseline AH interval (AV nodal function is subnormal)   2. No induceable tachycardia; Only induceable double echo beats As per EP,  event monitor with ectopic atrial tachycardia versus atrial flutter with 2:1 block.   Notes indicating she did not want anticoagulation  PRIOR CARDIAC STUDIES: -ECHOCARDIOGRAM 12/13/06: LVEF 55-60%. No significant valvular abnormalities. Noted to have episodes of atrial flutter at controlled rate. -ETT 09/28/07: Bruce protocol 5:01 minutes. 7 METS. Maximal HR 179 bpm (111% MPHR). Pretest ECG normal with HR 96 bpm. No chest pain. No significant ischemia.  -STRESS ECHO 11/14/08: Bruce protocol 4:31 minutes. 4.6 METS. 113% MPHR. LVEF 60%. No significant ischemia.  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 -Hypertension -Hyperlipidemia -History of TIA -Asthma -History of migraines -Right cataract and history of right retinal bleeding over a year ago, now resolved -S/P umbilical hernia repair -S/P tonsillectomy -S/P multiple wrist surgeries -S/P excision of right supraclavicular neck mass (lymph node) -S/P cataract surgery   PMH:   has a past medical history of Actinic keratosis, Acute pulmonary embolus ([place]) (01/29/2020), Acute renal failure (ARF) ([place]) (01/31/2020), Acute respiratory failure with hypoxia ([place]) (01/29/2020), Acute septic pulmonary embolism ([place]), AKI (acute kidney injury) ([place]), Amaurosis fugax, right eye (12/2018), Arthritis, Asthma, Basal cell carcinoma (07/2016), Cardiomyopathy ([place]), GERD (gastroesophageal reflux disease), Herniated disc, cervical, Hyperlipidemia, Left wrist fracture, Melanoma ([place]) (05/12/2020), Osteoporosis, Pneumonia due to COVID-19 virus (01/26/2020), Stroke ([place]) (1998), and SVT (supraventricular tachycardia) ([place]).  PSH:    Past Surgical History:  Procedure Laterality Date   BACK SURGERY  2017   L2-4 laminectomy and foraminal stenosis   BREAST CYST ASPIRATION Left 1977   CARDIAC CATHETERIZATION  2009   [place] in [place]  2009   CATARACT EXTRACTION, BILATERAL  2010   COLONOSCOPY WITH PROPOFOL N/A 03/21/2018   Procedure: COLONOSCOPY WITH Biopsy;  Surgeon: Lin Landsman, MD;  Location: [facility];  Service: Endoscopy;  Laterality: N/A;   derrick procedure  1977-78   bilateral    ESOPHAGOGASTRODUODENOSCOPY  2013   gastritis; done for complaint of dysphagia   ESOPHAGOGASTRODUODENOSCOPY (EGD) WITH PROPOFOL N/A 03/21/2018   Procedure: ESOPHAGOGASTRODUODENOSCOPY (EGD) WITH Biopsies;  Surgeon: Lin Landsman, MD;  Location: [facility];  Service: Endoscopy;  Laterality: N/A;  KEEP THIS PATIENT FIRST   LEFT HEART CATH AND CORONARY ANGIOGRAPHY Left 05/02/2019   Procedure: LEFT HEART CATH AND CORONARY ANGIOGRAPHY;  Surgeon: Minna Merritts, MD;  Location: [facility];  Service: Cardiovascular;  Laterality: Left;   lymph node removal     neck   MOHS SURGERY  07/2016   BCCA nose   POLYPECTOMY N/A 03/21/2018   Procedure: POLYPECTOMY;  Surgeon: Lin Landsman, MD;  Location: [facility];  Service: Endoscopy;  Laterality: N/A;   REPLACEMENT TOTAL KNEE  2013   RT   TOTAL SHOULDER REPLACEMENT  2012   RT     Current Outpatient Medications  Medication Sig Dispense Refill   albuterol (PROVENTIL HFA;VENTOLIN HFA) 108 (90 Base) MCG/ACT inhaler Inhale 1-2 puffs into the lungs every 6 (six) hours as needed for wheezing or shortness of breath.      B Complex-C (B-COMPLEX WITH VITAMIN C) tablet Take 1 tablet by mouth daily.  baclofen (LIORESAL) 10 MG tablet TAKE 1 TABLET BY MOUTH THREE TIMES A DAY 270 tablet 0   Bioflavonoid Products (ESTER-C) TABS Take 1 tablet by mouth 2 (two) times daily.      calcium carbonate (OS-CAL - DOSED IN MG OF ELEMENTAL CALCIUM) 1250 (500 Ca) MG tablet Take 1 tablet by mouth.     cholecalciferol (VITAMIN D) 25 MCG (1000 UT) tablet Take 1,000 Units by mouth daily.     Cranberry 1000 MG CAPS  Take 1,000 mg by mouth daily.      ezetimibe (ZETIA) 10 MG tablet TAKE 1 TABLET BY MOUTH EVERY DAY 90 tablet 3   fluticasone (FLOVENT HFA) 220 MCG/ACT inhaler Inhale into the lungs 2 (two) times daily.     gabapentin (NEURONTIN) 300 MG capsule TAKE 1 CAPSULE BY MOUTH 3 TIMES DAILY. AND TAKE 3 CAPSULES DAILY AT BEDTIME. 540 capsule 1   Ginger, Zingiber officinalis, (GINGER PO) Take 1,100 mg by mouth daily.      losartan (COZAAR) 25 MG tablet Take 12.5 mg by mouth daily.     MAG ASPART-POTASSIUM ASPART PO Take 1 tablet by mouth 2 (two) times daily. 600 mg-198 mg     Misc Natural Products (TART CHERRY ADVANCED PO) Take 1 capsule by mouth daily.      montelukast (SINGULAIR) 10 MG tablet Take 10 mg by mouth every evening.     Multiple Vitamins-Minerals (MULTIVITAMIN WITH MINERALS) tablet Take 1 tablet by mouth daily. Centrum Silver     Olodaterol HCl (STRIVERDI RESPIMAT) 2.5 MCG/ACT AERS Inhale 2 puffs into the lungs daily. USE AT THE SAME TIME EVERYDAY 12 g 2   Probiotic Product (PROBIOTIC DAILY PO) Take 1 capsule by mouth 2 (two) times a week.      rivaroxaban (XARELTO) 20 MG TABS tablet Take 1 tablet (20 mg total) by mouth daily with supper. 90 tablet 1   Turmeric 500 MG CAPS Take 500 mg by mouth daily.      zinc sulfate 220 (50 Zn) MG capsule Take 1 capsule (220 mg total) by mouth daily. 30 capsule 0   carvedilol (COREG) 3.125 MG tablet Take 1 tablet (3.125 mg total) by mouth 2 (two) times daily with a meal. 180 tablet 3   No current facility-administered medications for this visit.     Allergies:   Amoxicillin, Aspirin, Ciprofloxacin, Ciprofloxacin-dexamethasone, Crestor [rosuvastatin], Morphine and related, Doxycycline hyclate, Hydrochlorothiazide, Keflex [cephalexin], Lovastatin, Simvastatin, Erythromycin, Ibuprofen, Neomycin-bacitracin zn-polymyx, Omeprazole, and Tape   Social History:  The patient  reports that she has never smoked. She has never used smokeless tobacco. She  reports previous alcohol use. She reports that she does not use drugs.   Family History:   family history includes CAD (age of onset: 62) in her sister; Clotting disorder in her half-sister and half-sister; Diabetes in her mother; Stroke in her mother and sister; Stroke (age of onset: 35) in her sister; Valvular heart disease in her father.    Review of Systems: Review of Systems  Constitutional: Negative.   HENT: Negative.   Respiratory: Negative.   Cardiovascular: Negative.   Gastrointestinal: Negative.   Musculoskeletal: Negative.   Psychiatric/Behavioral: Negative.   All other systems reviewed and are negative.   PHYSICAL EXAM: VS:  BP 130/82 (BP Location: Left Arm, Patient Position: Sitting, Cuff Size: Large)    Pulse 74    Ht 5\' 6"  (1.676 m)    Wt 235 lb (106.6 kg)    SpO2 95%    BMI 37.93 kg/m  ,  BMI Body mass index is 37.93 kg/m. Constitutional:  oriented to person, place, and time. No distress.  HENT:  Head: Grossly normal Eyes:  no discharge. No scleral icterus.  Neck: No JVD, no carotid bruits  Cardiovascular: Regular rate and rhythm, no murmurs appreciated Pulmonary/Chest: Clear to auscultation bilaterally, no wheezes or rails Abdominal: Soft.  no distension.  no tenderness.  Musculoskeletal: Normal range of motion Neurological:  normal muscle tone. Coordination normal. No atrophy Skin: Skin warm and dry Psychiatric: normal affect, pleasant   Recent Labs: 01/26/2020: B Natriuretic Peptide 95.2 01/27/2020: TSH 0.552 02/01/2020: Magnesium 2.4 05/28/2020: ALT 13; BUN 15; Creatinine, Ser 1.05; Hemoglobin 14.3; Platelets 200; Potassium 4.1; Sodium 141    Lipid Panel Lab Results  Component Value Date   CHOL 113 01/30/2020   HDL 40 (L) 01/30/2020   LDLCALC 68 01/30/2020   TRIG 27 01/30/2020      Wt Readings from Last 3 Encounters:  06/08/20 235 lb (106.6 kg)  05/28/20 220 lb (99.8 kg)  05/06/20 238 lb (108 kg)       ASSESSMENT AND PLAN:  covid 19  pneumonia, Hospitalized Sept 2021 Bilateral PE, on xarelto, recommendation was made for 6 months She is nervous about coming off We will see her back in 3 months time to discuss timing of when she can come off of Xarelto  SVT (supraventricular tachycardia) ([place]) -  Prior ablation  Continue Coreg 3.125 twice daily, denies any breakthrough tach arrhythmia  Cardiomyopathy, dilated Mildly depressed ejection fraction 2019, worsening drop  to 35% Continue carvedilol,  losartan 12.5 mg daily Stop midodrine and metoprolol.  Some medication confusion.  She was actually taking carvedilol and metoprolol as it appears her carvedilol was not stopped when she left the hospital Good.  Blood pressure has recovered from when she was in the hospital September 2021  Atrial tachycardia [facility]) -  Prior monitor showing short run VT and PVCs Previous episode likely atrial tachycardia resolved before she got to the emergency room June 04, 2019 Recommend she call us for any tachypalpitations Could take extra carvedilol as needed for breakthrough palpitations  Mixed hyperlipidemia - She does not want a statin Also reports Zetia may have caused her eye problems Recommended lifestyle modification  Personal history of transient ischemic attack (TIA), and cerebral infarction without residual deficits Does not want aspirin, Plavix  Currently on Xarelto for PE No documentation of atrial fibrillation or flutter  Reactive airway disease/asthma On inhalers, Tolerating carvedilol Recovered from COVID   Total encounter time more than 25 minutes  Greater than 50% was spent in counseling and coordination of care with the patient   Orders Placed This Encounter  Procedures   EKG 12-Lead     Signed, Esmond Plants, M.D., Ph.D. 06/08/2020  [place], [place]

## 2020-06-08 ENCOUNTER — Encounter: Payer: Self-pay | Admitting: Cardiovascular Disease

## 2020-06-08 ENCOUNTER — Ambulatory Visit (INDEPENDENT_AMBULATORY_CARE_PROVIDER_SITE_OTHER): Payer: Medicare Other | Admitting: Cardiovascular Disease

## 2020-06-08 ENCOUNTER — Encounter: Payer: Medicare Other | Admitting: Dermatology

## 2020-06-08 ENCOUNTER — Other Ambulatory Visit: Payer: Self-pay

## 2020-06-08 VITALS — BP 130/82 | HR 74 | Ht 66.0 in | Wt 235.0 lb

## 2020-06-08 DIAGNOSIS — I420 Dilated cardiomyopathy: Secondary | ICD-10-CM

## 2020-06-08 DIAGNOSIS — I471 Supraventricular tachycardia: Secondary | ICD-10-CM

## 2020-06-08 DIAGNOSIS — I2699 Other pulmonary embolism without acute cor pulmonale: Secondary | ICD-10-CM | POA: Diagnosis not present

## 2020-06-08 MED ORDER — CARVEDILOL 3.125 MG PO TABS: 3.1250 mg | ORAL_TABLET | Freq: Two times a day (BID) | ORAL | 3 refills | Status: DC

## 2020-06-08 NOTE — Telephone Encounter (Signed)
Rx request sent to pharmacy.  

## 2020-06-08 NOTE — Patient Instructions (Addendum)
Medication Instructions:  Stop taking  metoprolol   Midodrine  Stay on Xarelto 20mg  daily at supper time  If you need a refill on your cardiac medications before your next appointment, please call your pharmacy.   Lab work: none  Testing/Procedures: none   Follow-Up: At [facility], you and your health needs are our priority.  As part of our continuing mission to provide you with exceptional heart care, we have created designated Provider Care Teams.  These Care Teams include your primary Cardiologist (physician) and Advanced Practice Providers (APPs -  Physician Assistants and Nurse Practitioners) who all work together to provide you with the care you need, when you need it.  . You will need a follow up appointment in 3 months  . Providers on your designated Care Team:   . Murray Hodgkins, NP . Christell Faith, PA-C . Marrianne Mood, PA-C  Any Other Special Instructions Will Be Listed Below (If Applicable).  COVID-19 Vaccine Information can be found at: ShippingScam.co.uk For questions related to vaccine distribution or appointments, please email [email or web address] or call [phone].

## 2020-06-10 ENCOUNTER — Telehealth: Payer: Self-pay

## 2020-06-10 NOTE — Telephone Encounter (Signed)
Spoke to pt she stated the pharmacy made a mistake that they are giving her 540 capsules.  KP

## 2020-06-10 NOTE — Telephone Encounter (Signed)
Copied from [place] [id number]. Topic: General - Other >> Jun 10, 2020  3:51 PM Leward Quan A wrote: Reason for CRM: Patient called in to  inform Dr Army Melia that she went to the pharmacy and was only given a partial refill on her gabapentin (NEURONTIN) 300 MG capsule she received 180 capsules. Asking for a new Rx to be sent to her Pharmacy please Ph# [phone]

## 2020-06-15 ENCOUNTER — Ambulatory Visit: Payer: Medicare Other | Admitting: Cardiovascular Disease

## 2020-06-22 ENCOUNTER — Other Ambulatory Visit: Payer: Self-pay | Admitting: Internal Medicine

## 2020-06-22 DIAGNOSIS — M79601 Pain in right arm: Secondary | ICD-10-CM

## 2020-06-24 ENCOUNTER — Other Ambulatory Visit: Payer: Self-pay | Admitting: Internal Medicine

## 2020-06-29 ENCOUNTER — Other Ambulatory Visit: Payer: Self-pay | Admitting: Cardiovascular Disease

## 2020-06-29 NOTE — Telephone Encounter (Signed)
Refill Request.  

## 2020-06-29 NOTE — Telephone Encounter (Signed)
Pt last saw Dr Rockey Situ 06/08/20, last labs 05/28/20 Creat 1.05, age 75, weight 106.6kg, CrCl 81.5, based on CrCl pt is on appropriate dosage of Xarelto 20mg  QD.  Will refill rx.

## 2020-07-09 ENCOUNTER — Other Ambulatory Visit: Payer: Self-pay | Admitting: Internal Medicine

## 2020-07-09 ENCOUNTER — Telehealth: Payer: Self-pay | Admitting: Internal Medicine

## 2020-07-09 DIAGNOSIS — J4520 Mild intermittent asthma, uncomplicated: Secondary | ICD-10-CM

## 2020-07-09 MED ORDER — STRIVERDI RESPIMAT 2.5 MCG/ACT IN AERS: 2.0000 | INHALATION_SPRAY | Freq: Every day | RESPIRATORY_TRACT | 0 refills | Status: DC

## 2020-07-09 NOTE — Telephone Encounter (Signed)
One month of Striverdi 2.5 has been sent to preferred pharmacy.  Recall placed for March.  Patient is aware and voiced her understanding.  Nothing further needed.

## 2020-07-13 ENCOUNTER — Encounter: Payer: Medicare Other | Admitting: Dermatology

## 2020-07-15 ENCOUNTER — Other Ambulatory Visit: Payer: Self-pay | Admitting: Internal Medicine

## 2020-07-15 DIAGNOSIS — J4520 Mild intermittent asthma, uncomplicated: Secondary | ICD-10-CM

## 2020-07-23 ENCOUNTER — Other Ambulatory Visit: Payer: Self-pay | Admitting: Internal Medicine

## 2020-07-23 DIAGNOSIS — J4520 Mild intermittent asthma, uncomplicated: Secondary | ICD-10-CM

## 2020-08-03 ENCOUNTER — Other Ambulatory Visit: Payer: Self-pay | Admitting: Cardiovascular Disease

## 2020-08-03 NOTE — Telephone Encounter (Signed)
Patient calling  States that this medication was discontinued recently by Dr Rockey Situ  Patient's BP has been low and would like to continue taking so is asking for it to be refilled Please review and advise

## 2020-08-03 NOTE — Telephone Encounter (Signed)
Was able to return pt's call, Audrey Martinez was confused by [facility], she reports she though the pharmacist told her that Dr. Rockey Situ had cancel the medication, per Dr. Rockey Situ last visit notes in Jan "Continue carvedilol,  losartan 12.5 mg daily" no changes noted. refills could have been old, sent in new script for Losartan 12.5 mg daily. Advised pt if [facility] has any more issues with filling medication then to call back. Also Audrey Martinez reports her BP in fact is not "low" as reported by telephone note, Audrey Martinez reports BP WNL and HR 70-80s. No cardiac concerns or issues per pt. Audrey Martinez will [facility] about her losartan, if any trouble, will call back.

## 2020-08-04 ENCOUNTER — Ambulatory Visit: Payer: Medicare Other | Admitting: Adult Health

## 2020-08-10 ENCOUNTER — Encounter: Payer: Self-pay | Admitting: Dermatology

## 2020-08-10 ENCOUNTER — Ambulatory Visit (INDEPENDENT_AMBULATORY_CARE_PROVIDER_SITE_OTHER): Payer: Medicare Other | Admitting: Dermatology

## 2020-08-10 ENCOUNTER — Other Ambulatory Visit: Payer: Self-pay

## 2020-08-10 DIAGNOSIS — D034 Melanoma in situ of scalp and neck: Secondary | ICD-10-CM | POA: Diagnosis not present

## 2020-08-10 DIAGNOSIS — L988 Other specified disorders of the skin and subcutaneous tissue: Secondary | ICD-10-CM | POA: Diagnosis not present

## 2020-08-10 MED ORDER — MUPIROCIN 2 % EX OINT: 1.0000 "application " | TOPICAL_OINTMENT | Freq: Every day | CUTANEOUS | 0 refills | Status: DC

## 2020-08-10 NOTE — Progress Notes (Signed)
   Follow-Up Visit   Subjective  Audrey Martinez is a 75 y.o. female who presents for the following: Melanoma IS bx proven (R post neck, pt presents for surgery).   The following portions of the chart were reviewed this encounter and updated as appropriate:       Review of Systems:  No other skin or systemic complaints except as noted in HPI or Assessment and Plan.  Objective  Well appearing patient in no apparent distress; mood and affect are within normal limits.  A focused examination was performed including neck. Relevant physical exam findings are noted in the Assessment and Plan.  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[place]) R post neck  Skin excision  Lesion length (cm):  1.2 Lesion width (cm):  1.2 Margin per side (cm):  0.5 Total excision diameter (cm):  2.2 Informed consent: discussed and consent obtained   Timeout: patient name, date of birth, surgical site, and procedure verified   Procedure prep:  Patient was prepped and draped in usual sterile fashion Prep type:  Povidone-iodine Anesthesia: the lesion was anesthetized in a standard fashion   Anesthetic:  1% lidocaine w/ epinephrine 1-100,000 buffered w/ 8.4% NaHCO3 (22cc) Instrument used: #15 blade   Hemostasis achieved with: pressure   Hemostasis achieved with comment:  Electrocautery Outcome: patient tolerated procedure well with no complications    Skin repair Complexity:  Complex Final length (cm):  5 Informed consent: discussed and consent obtained   Reason for type of repair: reduce tension to allow closure, reduce the risk of dehiscence, infection, and necrosis, reduce subcutaneous dead space and avoid a hematoma, allow closure of the large defect and preserve normal anatomical and functional relationships   Undermining: area extensively undermined   Undermining comment:  2.2 cm Subcutaneous layers (deep stitches):  Suture size:   3-0 Suture type: Vicryl (polyglactin 910)   Stitches:  Buried vertical mattress Fine/surface layer approximation (top stitches):  Suture size:  3-0 and 4-0 Suture type: nylon   Suture type comment:  Nylon Stitches: simple interrupted   Suture removal (days):  7 Hemostasis achieved with: suture Outcome: patient tolerated procedure well with no complications   Post-procedure details: sterile dressing applied and wound care instructions given   Dressing type: pressure dressing (Mupirocin)    mupirocin ointment (BACTROBAN) 2 %  Specimen 1 - Surgical pathology Differential Diagnosis: D03.4 Bx proven Melanoma IS Check Margins: yes Pink bx site [phone] Tagged at 12 0'clock superior tip  Melanoma IS bx proven excised today  Start Mupirocin oint qd to excision site  Return in about 1 week (around 08/17/2020) for Suture Removal, ISK upper occipital scalp.  I, Othelia Pulling, RMA, am acting as scribe for Brendolyn Patty, MD .  Documentation: I have reviewed the above documentation for accuracy and completeness, and I agree with the above.  Brendolyn Patty MD

## 2020-08-10 NOTE — Patient Instructions (Addendum)

## 2020-08-11 ENCOUNTER — Telehealth: Payer: Self-pay

## 2020-08-11 NOTE — Telephone Encounter (Signed)
Patient doing fine after yesterdays surgery./sh 

## 2020-08-17 ENCOUNTER — Other Ambulatory Visit: Payer: Self-pay

## 2020-08-17 ENCOUNTER — Ambulatory Visit (INDEPENDENT_AMBULATORY_CARE_PROVIDER_SITE_OTHER): Payer: Medicare Other | Admitting: Dermatology

## 2020-08-17 DIAGNOSIS — Z4802 Encounter for removal of sutures: Secondary | ICD-10-CM

## 2020-08-17 DIAGNOSIS — D034 Melanoma in situ of scalp and neck: Secondary | ICD-10-CM

## 2020-08-17 NOTE — Progress Notes (Signed)
   Follow-Up Visit   Subjective  Audrey Martinez is a 75 y.o. female who presents for the following: Post op (Melanoma in situ - margins free of the right posterior neck.).   The following portions of the chart were reviewed this encounter and updated as appropriate:       Review of Systems:  No other skin or systemic complaints except as noted in HPI or Assessment and Plan.  Objective  Well appearing patient in no apparent distress; mood and affect are within normal limits.  A focused examination was performed including face, neck. Relevant physical exam findings are noted in the Assessment and Plan.  Objective  right posterior neck: Healing excision site.   Assessment & Plan  Melanoma in situ of neck (HCC) right posterior neck  Margins free, biopsy proven.  - Incision site at the right posterior neck is clean, dry and intact - Wound cleansed, sutures removed, wound cleansed and steri strips applied.  - Discussed pathology results showing melanoma in situ, margins free  - Patient advised to keep steri-strips dry until they fall off. - Scars remodel for a full year. - Once steri-strips fall off, patient can apply over-the-counter silicone scar cream each night to help with scar remodeling if desired. - Patient advised to call with any concerns or if they notice any new or changing lesions.   Other Related Medications mupirocin ointment (BACTROBAN) 2 %  Return in about 6 months (around 02/17/2021) for TBSE, Hx melanoma in situ .   IJamesetta Orleans, CMA, am acting as scribe for Brendolyn Patty, MD .  Documentation: I have reviewed the above documentation for accuracy and completeness, and I agree with the above.  Brendolyn Patty MD

## 2020-09-02 ENCOUNTER — Ambulatory Visit (INDEPENDENT_AMBULATORY_CARE_PROVIDER_SITE_OTHER): Payer: Medicare Other | Admitting: Internal Medicine

## 2020-09-02 ENCOUNTER — Encounter: Payer: Self-pay | Admitting: Internal Medicine

## 2020-09-02 ENCOUNTER — Other Ambulatory Visit: Payer: Self-pay

## 2020-09-02 VITALS — BP 126/74 | HR 97 | Temp 97.3°F | Ht 66.0 in | Wt 241.0 lb

## 2020-09-02 DIAGNOSIS — J4520 Mild intermittent asthma, uncomplicated: Secondary | ICD-10-CM

## 2020-09-02 NOTE — Patient Instructions (Signed)
Continue inhalers as prescribed-LET's hold FLOVENT and assess breathing Avoid triggers

## 2020-09-02 NOTE — Progress Notes (Signed)
Name: Audrey Martinez MRN: [id number] DOB: 19-Jul-1945      CONSULTATION DATE:09/02/2020  REFERRING MD :  Dr Rockey Situ  CHIEF COMPLAINT: Follow up ASTHMA   STUDIES:  NO CXR at this time     HISTORY OF PRESENT ILLNESS:  Follow-up assessment for asthma Doing well with inhalers at this time  No exacerbation at this time No evidence of heart failure at this time No evidence or signs of infection at this time No respiratory distress No fevers, chills, nausea, vomiting, diarrhea No evidence of lower extremity edema No evidence hemoptysis  On STRIVERDI  and Flovent Rinse mouth after every use  Albuterol use is in frequent On PPI for GERD ENT referral completed no issues per patient  Has dogs all her life-no allergies to dogs  Patient does not take any aspirin due to the fact that she states that she has an aspirin sensitivity that triggers her asthma  PREVIOUS COVID 19 infection Sept 2021   Review of Systems:  Gen:  Denies  fever, sweats, chills weight loss  HEENT: Denies blurred vision, double vision, ear pain, eye pain, hearing loss, nose bleeds, sore throat Cardiac:  No dizziness, chest pain or heaviness, chest tightness,edema, No JVD Resp:   No cough, -sputum production, -shortness of breath,-wheezing, -hemoptysis,  Gi: Denies swallowing difficulty, stomach pain, nausea or vomiting, diarrhea, constipation, bowel incontinence Gu:  Denies bladder incontinence, burning urine Ext:   Denies Joint pain, stiffness or swelling Skin: Denies  skin rash, easy bruising or bleeding or hives Endoc:  Denies polyuria, polydipsia , polyphagia or weight change Psych:   Denies depression, insomnia or hallucinations  Other:  All other systems negative  BP 126/74 (BP Location: Left Arm, Cuff Size: Normal)   Pulse 97   Temp (!) 97.3 F (36.3 C) (Temporal)   Ht 5\' 6"  (1.676 m)   Wt 241 lb (109.3 kg)   SpO2 95%   BMI 38.90 kg/m    Physical Examination:   General  Appearance: No distress  Neuro:without focal findings,  speech normal,  HEENT: PERRLA, EOM intact.   Pulmonary: normal breath sounds, No wheezing.  CardiovascularNormal S1,S2.  No m/r/g.   Abdomen: Benign, Soft, non-tender. Renal:  No costovertebral tenderness  GU:  Not performed at this time. Endoc: No evident thyromegaly Skin:   warm, no rashes, no ecchymosis  Extremities: normal, no cyanosis, clubbing. PSYCHIATRIC: Mood, affect within normal limits.   ALL OTHER ROS ARE NEGATIVE      Active Ambulatory Problems    Diagnosis Date Noted  . Osteoporosis 03/21/2017  . Personal history of transient ischemic attack (TIA), and cerebral infarction without residual deficits 04/22/2003  . Hyperlipidemia 05/12/2003  . Benign essential tremor 03/21/2017  . Atrial tachycardia ([place]) 04/14/2017  . SVT (supraventricular tachycardia) ([place]) 04/14/2017  . Essential hypertension 04/14/2017  . Intermittent asthma without complication 32/04/2481  . Lumbar radiculopathy 05/18/2017  . Status post hemilaminotomy 05/18/2017  . Lumbar degenerative disc disease 05/18/2017  . Spinal stenosis of lumbar region without neurogenic claudication 05/18/2017  . Tendinitis of right knee 07/03/2017  . Arm pain, right 11/08/2017  . Renal insufficiency 11/08/2017  . Amaurosis fugax of right eye 01/13/2019  . Dilated cardiomyopathy ([place]) 05/02/2019  . Chronic obstructive pulmonary disease, unspecified COPD type ([place]) 10/08/2019  . Left lower lobe pulmonary nodule 01/29/2020  . Pulmonary embolus ([place]) 01/31/2020  . Atrial flutter ([place]) 01/31/2020  . Obesity (BMI 30-39.9) 01/31/2020  . Hx of atrioventricular node ablation 02/01/2008   Resolved  Ambulatory Problems    Diagnosis Date Noted  . Pneumonia due to COVID-19 virus 01/26/2020  . Shortness of breath   . AKI (acute kidney injury) ([place])   . Acute septic pulmonary embolism ([place])   . Acute respiratory failure with hypoxia ([place]) 01/29/2020  . Acute  pulmonary embolus ([place]) 01/29/2020  . Acute renal failure (ARF) ([place]) 01/31/2020   Past Medical History:  Diagnosis Date  . Actinic keratosis   . Amaurosis fugax, right eye 12/2018  . Arthritis   . Asthma   . Basal cell carcinoma 07/2016  . Cardiomyopathy ([place])   . GERD (gastroesophageal reflux disease)   . Herniated disc, cervical   . Left wrist fracture   . Melanoma ([place]) 05/12/2020  . Stroke [facility]) 1998     ASSESSMENT AND PLAN Mild intermittent asthma Seems to be well-controlled at this time Continue inhalers as prescribed Avoid triggers Avoid secondhand smoke perfumes chemicals and dust Plan to hold FLOVENT and assess resp status  Allergic rhinitis Continue antihistamine as prescribed Continue Singulair  GERD seems to be controlled Continue PPI  Obesity -recommend significant weight loss -recommend changing diet  Deconditioned state -Recommend increased daily activity and exercise    COVID-19 EDUCATION: The signs and symptoms of COVID-19 were discussed with the patient and how to seek care for testing.  The importance of social distancing was discussed today. Hand Washing Techniques and avoid touching face was advised.     MEDICATION ADJUSTMENTS/LABS AND TESTS ORDERED: Avoid triggers    CURRENT MEDICATIONS REVIEWED AT LENGTH WITH PATIENT TODAY   Patient satisfied with Plan of action and management. All questions answered  Follow up in 1 year  Total time spent 25 mins   Shuronda Santino Patricia Pesa, M.D.  [facility]  Medical Director [place] Director [facility] Cardio-Pulmonary Department

## 2020-09-10 NOTE — Progress Notes (Signed)
Cardiology Office Note  Date:  09/11/2020   ID:  Fathima, Bartl [birth year], MRN [id number]  PCP:  Glean Hess, MD   Chief Complaint  Patient presents with  . 3 month follow up     "doing well." Medications reviewed by the patient verbally.      HPI:  Audrey Martinez is a 75 year old woman with history of Chronic sinus tachycardia SVT, atrial tachycardia Ablation x 2, 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, at [facility] as a child, chronic lung disease, chronic cough/asthma.  Non-smoker  orthostasis Previous migraines Nonischemic cardiomyopathy ejection fraction 30 to 35% November 2020 Catheterization with nonobstructive coronary disease Ejection fraction remaining 30 to 35% in August 2021  COVID infection  in sept 2021 Bilateral pulmonary emboli, without right heart strain. Who presents for f/u of her atrial tachycardia, SVT, EF 30 to 35%, possible hypertensive cardiomyopathy, medication intolerances  Last seen in clinic by myself jan 2022 Doing well, on today's visit,  Unsteady gait, uses a cane No regular exercise program  Denies any recent tachypalpitations No chest pain concerning for angina Denies any leg swelling, no orthostasis, PND symptoms  Lives alone, son helps Does her ADLs Weight running high  EKG personally reviewed by myself on todays visit NSR rate 82 bpm poor R wave progression to the anterior precordial leads, left axis deviation  Other past medical history reviewed echo August 2021 ejection fraction 30 to 35%, same as 2020, normal right heart pressures  Echocardiogram April 17, 2019  with ejection fraction 30 to 35%, normal RV function  Cardiac catheterization performed for cardiomyopathy  Catheterization December 2020 Nonobstructive CAD, EF estimated at 30 to 35%, global hypokinesis,  LVEDP 17 to 20 mm Hg  episode of tachycardia, went to the ER 06/03/2018 Rhythm broke before Audrey Martinez could get an EKG in the emergency  room Rare episodes, does not remember the last time Audrey Martinez had an episode  Right eye vision went dark, 01/02/2019 seen in the ER MRI in the ER, no CVA CT head Encephalomalacia changes in the left frontal region. Left eye: darkness "came down a bit"  zio monitor,  Normal sinus rhythm  avg HR of 89 bpm. 3 Ventricular Tachycardia runs occurred, the run with the fastest interval lasting 5 beats with a max rate of 200 bpm, the longest lasting 7 beats with an avg rate of 141 bpm.  Patient triggered events were not associated with significant arrhythmia.  Carotid: Minor carotid atherosclerosis. No hemodynamically significant ICA stenosis. Degree of narrowing less than 50% bilaterally by ultrasound criteria.   Echocardiogram May 18, 2018 Ejection fraction 45 to 50% mild diffuse hypokinesis unable to exclude regional wall motion abnormalities Concerning for anteroseptal and apical hypokinesis   Lab Results  Component Value Date   CHOL 113 01/30/2020   HDL 40 (L) 01/30/2020   LDLCALC 68 01/30/2020   TRIG 27 01/30/2020     Notes from [place] cardiologist Notes indicate 1. Dual AV nodal physiology. 2. Typical slow fast AV nodal reentrant tachycardia. 3. Nonsustained AT / No inducible atrial flutter  Audrey Martinez underwent s/p Successful radiofrequency ablation of AV nodal slow pathway.   Audrey Martinez had recurrent symptoms shortly after, had event monitor  repeat EPS 01/21/08:  1. Markedly prolonged baseline AH interval (AV nodal function is subnormal)  2. No induceable tachycardia; Only induceable double echo beats As per EP,  event monitor with ectopic atrial tachycardia versus atrial flutter with 2:1 block.   Notes indicating Audrey Martinez did not  want anticoagulation  PRIOR CARDIAC STUDIES: -ECHOCARDIOGRAM 12/13/06: LVEF 55-60%. No significant valvular abnormalities. Noted to have episodes of atrial flutter at controlled rate. -ETT 09/28/07: Bruce protocol 5:01 minutes. 7 METS. Maximal HR 179 bpm (111%  MPHR). Pretest ECG normal with HR 96 bpm. No chest pain. No significant ischemia.  -STRESS ECHO 11/14/08: Bruce protocol 4:31 minutes. 4.6 METS. 113% MPHR. LVEF 60%. No significant ischemia.  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 -Hypertension -Hyperlipidemia -History of TIA -Asthma -History of migraines -Right cataract and history of right retinal bleeding over a year ago, now resolved -S/P umbilical hernia repair -S/P tonsillectomy -S/P multiple wrist surgeries -S/P excision of right supraclavicular neck mass (lymph node) -S/P cataract surgery   PMH:   has a past medical history of Actinic keratosis, Acute pulmonary embolus ([place]) (01/29/2020), Acute renal failure (ARF) ([place]) (01/31/2020), Acute respiratory failure with hypoxia ([place]) (01/29/2020), Acute septic pulmonary embolism ([place]), AKI (acute kidney injury) ([place]), Amaurosis fugax, right eye (12/2018), Arthritis, Asthma, Basal cell carcinoma (07/2016), Cardiomyopathy ([place]), GERD (gastroesophageal reflux disease), Herniated disc, cervical, Hyperlipidemia, Left wrist fracture, Melanoma ([place]) (05/12/2020), Osteoporosis, Pneumonia due to COVID-19 virus (01/26/2020), Stroke ([place]) (1998), and SVT (supraventricular tachycardia) ([place]).  PSH:    Past Surgical History:  Procedure Laterality Date  . BACK SURGERY  2017   L2-4 laminectomy and foraminal stenosis  . BREAST CYST ASPIRATION Left 1977  . CARDIAC CATHETERIZATION  2009   [facility] in [place]  . CARDIAC ELECTROPHYSIOLOGY STUDY AND ABLATION  2009  . CATARACT EXTRACTION, BILATERAL  2010  . COLONOSCOPY WITH PROPOFOL N/A 03/21/2018   Procedure: COLONOSCOPY WITH Biopsy;  Surgeon: Lin Landsman, MD;  Location: [place];  Service: Endoscopy;  Laterality: N/A;  . derrick procedure  1977-78   bilateral   . ESOPHAGOGASTRODUODENOSCOPY  2013   gastritis; done for complaint of dysphagia  . ESOPHAGOGASTRODUODENOSCOPY (EGD) WITH PROPOFOL N/A 03/21/2018    Procedure: ESOPHAGOGASTRODUODENOSCOPY (EGD) WITH Biopsies;  Surgeon: Lin Landsman, MD;  Location: [place];  Service: Endoscopy;  Laterality: N/A;  KEEP THIS PATIENT FIRST  . LEFT HEART CATH AND CORONARY ANGIOGRAPHY Left 05/02/2019   Procedure: LEFT HEART CATH AND CORONARY ANGIOGRAPHY;  Surgeon: Minna Merritts, MD;  Location: [place] [facility];  Service: Cardiovascular;  Laterality: Left;  . lymph node removal     neck  . MOHS SURGERY  07/2016   BCCA nose  . POLYPECTOMY N/A 03/21/2018   Procedure: POLYPECTOMY;  Surgeon: Lin Landsman, MD;  Location: [facility];  Service: Endoscopy;  Laterality: N/A;  . REPLACEMENT TOTAL KNEE  2013   RT  . TOTAL SHOULDER REPLACEMENT  2012   RT     Current Outpatient Medications  Medication Sig Dispense Refill  . albuterol (PROVENTIL HFA;VENTOLIN HFA) 108 (90 Base) MCG/ACT inhaler Inhale 1-2 puffs into the lungs every 6 (six) hours as needed for wheezing or shortness of breath.     . B Complex-C (B-COMPLEX WITH VITAMIN C) tablet Take 1 tablet by mouth daily.     . baclofen (LIORESAL) 10 MG tablet TAKE 1 TABLET BY MOUTH THREE TIMES A DAY 270 tablet 0  . Bioflavonoid Products (ESTER-C) TABS Take 1 tablet by mouth 2 (two) times daily.     . calcium carbonate (OS-CAL - DOSED IN MG OF ELEMENTAL CALCIUM) 1250 (500 Ca) MG tablet Take 1 tablet by mouth.    . carvedilol (COREG) 3.125 MG tablet Take 1 tablet (3.125 mg total) by mouth 2 (  two) times daily with a meal. 180 tablet 3  . cholecalciferol (VITAMIN D) 25 MCG (1000 UT) tablet Take 1,000 Units by mouth daily.    . Cranberry 1000 MG CAPS Take 1,000 mg by mouth daily.     Marland Kitchen ezetimibe (ZETIA) 10 MG tablet TAKE 1 TABLET BY MOUTH EVERY DAY 90 tablet 3  . fluticasone (FLOVENT HFA) 110 MCG/ACT inhaler Inhale 2 puffs into the lungs 2 (two) times daily. 12 g 0  . fluticasone (FLOVENT HFA) 220 MCG/ACT inhaler Inhale into the lungs 2 (two) times daily.    Marland Kitchen gabapentin (NEURONTIN) 300  MG capsule TAKE 1 CAPSULE BY MOUTH 3 TIMES DAILY. AND TAKE 3 CAPSULES DAILY AT BEDTIME. 540 capsule 1  . Ginger, Zingiber officinalis, (GINGER PO) Take 1,100 mg by mouth daily.     Marland Kitchen losartan (COZAAR) 25 MG tablet TAKE 1/2 TABLET BY MOUTH EVERY DAY 45 tablet 3  . MAG ASPART-POTASSIUM ASPART PO Take 1 tablet by mouth 2 (two) times daily. 600 mg-198 mg    . Misc Natural Products (TART CHERRY ADVANCED PO) Take 1 capsule by mouth daily.     . montelukast (SINGULAIR) 10 MG tablet Take 10 mg by mouth every evening.    . Multiple Vitamins-Minerals (MULTIVITAMIN WITH MINERALS) tablet Take 1 tablet by mouth daily. Centrum Silver    . mupirocin ointment (BACTROBAN) 2 % Apply 1 application topically daily. Qd to excision site with dressing changes 22 g 0  . Probiotic Product (PROBIOTIC DAILY PO) Take 1 capsule by mouth 2 (two) times a week.     . STRIVERDI RESPIMAT 2.5 MCG/ACT AERS INHALE 2 PUFFS INTO THE LUNGS DAILY. USE AT THE SAME TIME EVERYDAY 12 g 2  . traMADol (ULTRAM) 50 MG tablet Take 50 mg by mouth every 6 (six) hours as needed.    . Turmeric 500 MG CAPS Take 500 mg by mouth daily.     Alveda Reasons 20 MG TABS tablet TAKE 1 TABLET (20 MG TOTAL) BY MOUTH DAILY WITH SUPPER. 90 tablet 1  . zinc sulfate 220 (50 Zn) MG capsule Take 1 capsule (220 mg total) by mouth daily. 30 capsule 0   No current facility-administered medications for this visit.     Allergies:   Amoxicillin, Aspirin, Ciprofloxacin, Ciprofloxacin-dexamethasone, Crestor [rosuvastatin], Morphine and related, Doxycycline hyclate, Hydrochlorothiazide, Keflex [cephalexin], Lovastatin, Simvastatin, Erythromycin, Ibuprofen, Neomycin-bacitracin zn-polymyx, Omeprazole, and Tape   Social History:  The patient  reports that Audrey Martinez has never smoked. Audrey Martinez has never used smokeless tobacco. Audrey Martinez reports previous alcohol use. Audrey Martinez reports that Audrey Martinez does not use drugs.   Family History:   family history includes CAD (age of onset: 56) in her sister; Clotting  disorder in her half-sister and half-sister; Diabetes in her mother; Stroke in her mother and sister; Stroke (age of onset: 44) in her sister; Valvular heart disease in her father.    Review of Systems: Review of Systems  Constitutional: Negative.   HENT: Negative.   Respiratory: Negative.   Cardiovascular: Negative.   Gastrointestinal: Negative.   Musculoskeletal: Negative.   Psychiatric/Behavioral: Negative.   All other systems reviewed and are negative.   PHYSICAL EXAM: VS:  BP 120/82 (BP Location: Left Arm, Patient Position: Sitting, Cuff Size: Large)   Pulse 82   Ht 5\' 7"  (1.702 m)   Wt 235 lb (106.6 kg)   SpO2 98%   BMI 36.81 kg/m  , BMI Body mass index is 36.81 kg/m. Constitutional:  oriented to person, place, and time. No  distress.  HENT:  Head: Grossly normal Eyes:  no discharge. No scleral icterus.  Neck: No JVD, no carotid bruits  Cardiovascular: Regular rate and rhythm, no murmurs appreciated Pulmonary/Chest: Clear to auscultation bilaterally, no wheezes or rails Abdominal: Soft.  no distension.  no tenderness.  Musculoskeletal: Normal range of motion Neurological:  normal muscle tone. Coordination normal. No atrophy Skin: Skin warm and dry Psychiatric: normal affect, pleasant  Recent Labs: 01/26/2020: B Natriuretic Peptide 95.2 01/27/2020: TSH 0.552 02/01/2020: Magnesium 2.4 05/28/2020: ALT 13; BUN 15; Creatinine, Ser 1.05; Hemoglobin 14.3; Platelets 200; Potassium 4.1; Sodium 141    Lipid Panel Lab Results  Component Value Date   CHOL 113 01/30/2020   HDL 40 (L) 01/30/2020   LDLCALC 68 01/30/2020   TRIG 27 01/30/2020      Wt Readings from Last 3 Encounters:  09/11/20 235 lb (106.6 kg)  09/02/20 241 lb (109.3 kg)  06/08/20 235 lb (106.6 kg)     ASSESSMENT AND PLAN:  covid 19 pneumonia, Hospitalized Sept 2021 Bilateral PE, on xarelto,  Audrey Martinez is nervous about coming off anticoagulation Will recommend Audrey Martinez come off the Xarelto If Audrey Martinez is nervous,   could do a preventive dose of 10 mg daily  SVT (supraventricular tachycardia) (H[facility]) -  Prior ablation  Continue Coreg 3.125 twice daily, denies any breakthrough tach arrhythmia Denies any tachypalpitations  Cardiomyopathy, dilated Mildly depressed ejection fraction 2019, worsening drop  to 35% Nonischemic Recommend Audrey Martinez continue carvedilol,  losartan 12.5 mg daily  Atrial tachycardia ([place]) -  Prior monitor showing short run VT and PVCs Previous episode likely atrial tachycardia resolved before Audrey Martinez got to the emergency room June 04, 2019 Recommend Audrey Martinez call us for any tachypalpitations Could take extra carvedilol as needed for breakthrough palpitations  Mixed hyperlipidemia - Audrey Martinez does not want a statin  Zetia may have caused her eye problems  Personal history of transient ischemic attack (TIA), and cerebral infarction without residual deficits Does not want aspirin, Plavix  Currently on Xarelto for PE No documentation of atrial fibrillation or flutter Could consider Zetia 10 mg if Audrey Martinez wants prophylactic dosing  Reactive airway disease/asthma On inhalers, Recovered from COVID Stable   Total encounter time more than 25 minutes  Greater than 50% was spent in counseling and coordination of care with the patient   No orders of the defined types were placed in this encounter.    Signed, Esmond Plants, M.D., Ph.D. 09/11/2020  [place], [place]

## 2020-09-11 ENCOUNTER — Encounter: Payer: Self-pay | Admitting: Cardiovascular Disease

## 2020-09-11 ENCOUNTER — Telehealth: Payer: Self-pay

## 2020-09-11 ENCOUNTER — Other Ambulatory Visit: Payer: Self-pay

## 2020-09-11 ENCOUNTER — Ambulatory Visit (INDEPENDENT_AMBULATORY_CARE_PROVIDER_SITE_OTHER): Payer: Medicare Other | Admitting: Cardiovascular Disease

## 2020-09-11 VITALS — BP 120/82 | HR 82 | Ht 67.0 in | Wt 235.0 lb

## 2020-09-11 DIAGNOSIS — I429 Cardiomyopathy, unspecified: Secondary | ICD-10-CM | POA: Diagnosis not present

## 2020-09-11 DIAGNOSIS — I5020 Unspecified systolic (congestive) heart failure: Secondary | ICD-10-CM

## 2020-09-11 DIAGNOSIS — I420 Dilated cardiomyopathy: Secondary | ICD-10-CM | POA: Diagnosis not present

## 2020-09-11 DIAGNOSIS — I10 Essential (primary) hypertension: Secondary | ICD-10-CM

## 2020-09-11 DIAGNOSIS — I2699 Other pulmonary embolism without acute cor pulmonale: Secondary | ICD-10-CM | POA: Diagnosis not present

## 2020-09-11 DIAGNOSIS — E782 Mixed hyperlipidemia: Secondary | ICD-10-CM | POA: Diagnosis not present

## 2020-09-11 DIAGNOSIS — I471 Supraventricular tachycardia: Secondary | ICD-10-CM

## 2020-09-11 NOTE — Telephone Encounter (Signed)
-----   Message from Wynema Birch, RN sent at 09/11/2020  2:36 PM EDT ----- Regarding: Xarelto Hello, Dr. Rockey Situ is wantting to reduce pt's Xarelto, here is what Dr. Rockey Situ advised to me  "Seen in clinic today April 15  On Xarelto 20 mg daily for pulmonary embolism, has done 6 months  She can decrease down to 10 mg daily for preventive or stop the Xarelto  If off the Xarelto, and if sedentary would wear compression hose when sitting for prolonged periods of time"  I have called and spoke with pt regarding the change in therapy, she would be grateful is someone will reach out to her from pharmacy to review instructions and safety guidelines of weaning off this medication. She is currently on 20 mg daily.  Thanks Mandie ----- Message ----- From: Minna Merritts, MD Sent: 09/11/2020   1:06 PM EDT To: Wynema Birch, RN  Seen in clinic today April 15 On Xarelto 20 mg daily for pulmonary embolism, has done 6 months She can decrease down to 10 mg daily for preventive or stop the Xarelto If off the Xarelto, and if sedentary would wear compression hose when sitting for prolonged periods of time  TG

## 2020-09-11 NOTE — Telephone Encounter (Signed)
Addressed in other phone note from today.

## 2020-09-11 NOTE — Telephone Encounter (Signed)
Was able to reach out to pt regarding her Xarelto, reviewed with pt what Dr. Rockey Situ advised  "Seen in clinic today April 15  On Xarelto 20 mg daily for pulmonary embolism, has done 6 months  She can decrease down to 10 mg daily for preventive or stop the Xarelto  If off the Xarelto, and if sedentary would wear compression hose when sitting for prolonged periods of time"  Mrs. Siegmann verbalized understanding all questions or concerns were address and no additional concerns at this time. Agreeable to plan, will call back for anything further.  This RN will send secure message to [facility] anticoagulation pool so they are aware of dosing change and they can contact pt at her request to go over in detail of weaning off the Xarelto safely and answer any questions.   Pt grateful for the call, will invest in some compression socks that go just below the knees, reports cannot wear the "thigh-high" d/e thigh circumference.

## 2020-09-11 NOTE — Patient Instructions (Signed)
Medication Instructions:  No changes  If you need a refill on your cardiac medications before your next appointment, please call your pharmacy.    Lab work: No new labs needed   If you have labs (blood work) drawn today and your tests are completely normal, you will receive your results only by: . MyChart Message (if you have MyChart) OR . A paper copy in the mail If you have any lab test that is abnormal or we need to change your treatment, we will call you to review the results.   Testing/Procedures: No new testing needed   Follow-Up: At [facility], you and your health needs are our priority.  As part of our continuing mission to provide you with exceptional heart care, we have created designated Provider Care Teams.  These Care Teams include your primary Cardiologist (physician) and Advanced Practice Providers (APPs -  Physician Assistants and Nurse Practitioners) who all work together to provide you with the care you need, when you need it.  . You will need a follow up appointment in 12 months  . Providers on your designated Care Team:   . Christopher Berge, NP . Ryan Dunn, PA-C . Jacquelyn Visser, PA-C  Any Other Special Instructions Will Be Listed Below (If Applicable).  COVID-19 Vaccine Information can be found at: [email or web address] For questions related to vaccine distribution or appointments, please email [email or web address] or call [phone].     

## 2020-09-11 NOTE — Telephone Encounter (Addendum)
Pt started on Xarelto 01/30/20 for bilateral PE in setting of COVID infection, she was not vaccinated. Unclear if pt elected to completely stop Xarelto or change to low dose Xarelto. She still has 20mg  dosing on her med list. She does not need to wean off Xarelto, can just stop or just change to 10mg  dosing. Left message for pt to call back.

## 2020-09-11 NOTE — Telephone Encounter (Signed)
This was sent to me in a weird format called all reminders also looks like this needs to be addressed by a pharmd

## 2020-09-14 MED ORDER — RIVAROXABAN 10 MG PO TABS: 10.0000 mg | ORAL_TABLET | Freq: Every day | ORAL | 1 refills | Status: DC

## 2020-09-14 NOTE — Telephone Encounter (Signed)
2nd message left for pt.

## 2020-09-14 NOTE — Telephone Encounter (Addendum)
Pt returned call to clinic. She is concerned about stopping anticoagulation completely as she has twin half sisters and one developed multiple clots shortly after stopping anticoagulation and the other died from an MI about a week after stopping anticoagulation. Discussed that she would not be hypercoagulable if she stopped Xarelto, she would just go back to her baseline level of risk. Since she had a single provoked PE that has been fully treated, she would be safe to stop anticoagulation, however given her family history, she prefers to continue on lower Xarelto 10mg  daily dosing. Counseled pt that lower dose does not need to be taken with food like her 20mg  dose did, and that duration of therapy would be dependent on her level of comfort in stopping anticoagulation in the future. New rx has been sent to pharmacy. Pt was appreciative for the call.

## 2020-09-14 NOTE — Addendum Note (Signed)
Addended by: Arbell Wycoff E on: 09/14/2020 12:28 PM   Modules accepted: Orders

## 2020-09-20 ENCOUNTER — Other Ambulatory Visit: Payer: Self-pay | Admitting: Internal Medicine

## 2020-09-20 NOTE — Telephone Encounter (Signed)
Requested medication (s) are due for refill today: yes  Requested medication (s) are on the active medication list: yes  Last refill:  06/24/20  Future visit scheduled: yes  Notes to clinic:  med not delegated to NT to RF   Requested Prescriptions  Pending Prescriptions Disp Refills   baclofen (LIORESAL) 10 MG tablet [Pharmacy Med Name: BACLOFEN 10 MG TABLET] 270 tablet 0    Sig: TAKE 1 TABLET BY MOUTH THREE TIMES A DAY      Not Delegated - Analgesics:  Muscle Relaxants Failed - 09/20/2020  9:12 AM      Failed - This refill cannot be delegated      Passed - Valid encounter within last 6 months    Recent Outpatient Visits           4 months ago Essential hypertension   [facility] Glean Hess, MD   6 months ago Diarrhea, unspecified type   [facility] Glean Hess, MD   10 months ago Essential hypertension   [place], [place]   11 months ago Pain of right thigh   [facility] Glean Hess, MD   1 year ago Essential hypertension   [facility] Glean Hess, MD       Future Appointments             In 2 months [place] Melia Jesse Sans, MD [facility] [place], [place]   In 4 months Brendolyn Patty, MD [place]   In 8 months Brendolyn Patty, MD [place]

## 2020-10-29 ENCOUNTER — Other Ambulatory Visit: Payer: Self-pay | Admitting: Internal Medicine

## 2020-10-29 DIAGNOSIS — E782 Mixed hyperlipidemia: Secondary | ICD-10-CM

## 2020-10-29 NOTE — Telephone Encounter (Signed)
Requested Prescriptions  Pending Prescriptions Disp Refills  . ezetimibe (ZETIA) 10 MG tablet [Pharmacy Med Name: EZETIMIBE 10 MG TABLET] 90 tablet 2    Sig: TAKE 1 TABLET BY MOUTH EVERY DAY     Cardiovascular:  Antilipid - Sterol Transport Inhibitors Failed - 10/29/2020  4:07 AM      Failed - HDL in normal range and within 360 days    HDL  Date Value Ref Range Status  01/30/2020 40 (L) >40 mg/dL Final         Passed - Total Cholesterol in normal range and within 360 days    Cholesterol  Date Value Ref Range Status  01/30/2020 113 0 - 200 mg/dL Final         Passed - LDL in normal range and within 360 days    LDL Cholesterol  Date Value Ref Range Status  01/30/2020 68 0 - 99 mg/dL Final    Comment:           Total Cholesterol/HDL:CHD Risk Coronary Heart Disease Risk Table                     Men   Women  1/2 Average Risk   3.4   3.3  Average Risk       5.0   4.4  2 X Average Risk   9.6   7.1  3 X Average Risk  23.4   11.0        Use the calculated Patient Ratio above and the CHD Risk Table to determine the patient's CHD Risk.        ATP III CLASSIFICATION (LDL):  <100     mg/dL   Optimal  100-129  mg/dL   Near or Above                    Optimal  130-159  mg/dL   Borderline  160-189  mg/dL   High  >190     mg/dL   Very High Performed at [facility], [address] [place], [place] [address]          Passed - Triglycerides in normal range and within 360 days    Triglycerides  Date Value Ref Range Status  01/30/2020 27 <150 mg/dL Final         Passed - Valid encounter within last 12 months    Recent Outpatient Visits          5 months ago Essential hypertension   [place], MD   7 months ago Diarrhea, unspecified type   [facility] Glean Hess, MD   11 months ago Essential hypertension   [facility] Glean Hess, MD   1 year ago Pain of right thigh   [facility] Glean Hess, MD   1 year ago Essential hypertension   [facility] Glean Hess, MD      Future Appointments            In 4 weeks Glean Hess, MD [facility], [place]   In 3 months Brendolyn Patty, MD [place]   In 6 months Brendolyn Patty, MD [place]

## 2020-11-12 ENCOUNTER — Ambulatory Visit (INDEPENDENT_AMBULATORY_CARE_PROVIDER_SITE_OTHER): Payer: Medicare Other | Admitting: Dermatology

## 2020-11-12 ENCOUNTER — Other Ambulatory Visit: Payer: Self-pay

## 2020-11-12 DIAGNOSIS — L578 Other skin changes due to chronic exposure to nonionizing radiation: Secondary | ICD-10-CM

## 2020-11-12 DIAGNOSIS — L820 Inflamed seborrheic keratosis: Secondary | ICD-10-CM

## 2020-11-12 NOTE — Progress Notes (Signed)
   Follow-Up Visit   Subjective  Audrey Martinez is a 75 y.o. female who presents for the following: Follow-up (ISK follow up - treated with LN2 04/2020 and there is a small piece left ).  She also has a couple irritated spots on her forehead    The following portions of the chart were reviewed this encounter and updated as appropriate:        Review of Systems:  No other skin or systemic complaints except as noted in HPI or Assessment and Plan.  Objective  Well appearing patient in no apparent distress; mood and affect are within normal limits.  A focused examination was performed including scalp, face. Relevant physical exam findings are noted in the Assessment and Plan.  Upper occipital scalp x 2, left temple hairline x 1, left upper forehead x 1 (4) Erythematous keratotic or waxy stuck-on papule or plaque.    Assessment & Plan  Inflamed seborrheic keratosis Upper occipital scalp x 2, left temple hairline x 1, left upper forehead x 1  Destruction of lesion - Upper occipital scalp x 2, left temple hairline x 1, left upper forehead x 1  Destruction method: cryotherapy   Informed consent: discussed and consent obtained   Lesion destroyed using liquid nitrogen: Yes   Region frozen until ice ball extended beyond lesion: Yes   Outcome: patient tolerated procedure well with no complications   Post-procedure details: wound care instructions given   Additional details:  Prior to procedure, discussed risks of blister formation, small wound, skin dyspigmentation, or rare scar following cryotherapy. Recommend Vaseline ointment to treated areas while healing.   Actinic Damage - chronic, secondary to cumulative UV radiation exposure/sun exposure over time - diffuse scaly erythematous macules with underlying dyspigmentation - Recommend daily broad spectrum sunscreen SPF 30+ to sun-exposed areas, reapply every 2 hours as needed.  - Recommend staying in the shade or wearing long  sleeves, sun glasses (UVA+UVB protection) and wide brim hats (4-inch brim around the entire circumference of the hat). - Call for new or changing lesions.   Return for Follow up as scheduled, [facility]SE.  I, Ashok Cordia, CMA, am acting as scribe for Brendolyn Patty, MD .  Documentation: I have reviewed the above documentation for accuracy and completeness, and I agree with the above.  Brendolyn Patty MD

## 2020-11-12 NOTE — Patient Instructions (Signed)

## 2020-11-18 ENCOUNTER — Telehealth: Payer: Self-pay | Admitting: Cardiovascular Disease

## 2020-11-18 NOTE — Telephone Encounter (Signed)
Pt c/o medication issue:  1. Name of Medication: diltiazem   2. How are you currently taking this medication (dosage and times per day)? 30 MG 3 times a day   3. Are you having a reaction (difficulty breathing--STAT)? HR issues  4. What is your medication issue?  Patient states that she stopped taking losartan on her own and started taking diltiazem instead.  Her HR readings are now 82 and 70 - would like to know if she should change medication.  Please call to discuss.

## 2020-11-23 MED ORDER — CARVEDILOL 6.25 MG PO TABS: 6.2500 mg | ORAL_TABLET | Freq: Two times a day (BID) | ORAL | 3 refills | Status: DC

## 2020-11-23 NOTE — Telephone Encounter (Signed)
Was able to reach back out to Mrs. Watterson regarding her question of switching back to diltiazem from losartan, advised that Dr. Rockey Situ recommends   "In the setting of cardiomyopathy, low ejection fraction,  Diltiazem is actually not recommended  That is why we changed it to carvedilol and losartan both of which are recommended  She can take anything she would like but that is what we would recommend , carvedilol and losartan  If she would like a lower heart rate, carvedilol can be increased up to 6.25twice daily"  Mrs. Mishkin reports understanding, verbalized she will no longer take the diltiazem and is okay with increaeing her coreg to 6.25 mg BID, she will use up the 3.125 mg pill she has currently (take two tabs BID), and then her refills will be the one tab 6.25 mg.   Mrs. Rasool reports she still is not wanting to take the Losartan, reports "each time I take this pill in the morning, I am then having 3-4 spouts of runny diarrhea" and stated "I notice when I do not take the pill, I do not have diarrhea, so I know it's the pill causing the upset". Advised will let Dr. Rockey Situ know of the losartan and diarrhea for further recommendations.   Mrs. Busler thankful for the return call, agrees with plan, advised will call back with further instructions from Dr. Rockey Situ regarding losartan, pt agrees to monitor BP and HR with increased coreg.

## 2020-11-26 ENCOUNTER — Encounter: Payer: Medicare Other | Admitting: Internal Medicine

## 2020-12-17 NOTE — Telephone Encounter (Signed)
Patient calling to check status of advise as she has not had a response on below.   Patient also adding   STAT if HR is under 50 or over 120 (normal HR is 60-100 beats per minute)  What is your heart rate? 110 standing 80 resting   Do you have a log of your heart rate readings (document readings)? No   Do you have any other symptoms? See below

## 2020-12-17 NOTE — Telephone Encounter (Signed)
Spoke with patient and she reports nobody called her back with recommendations. She reports elevated heart rates with standing. Reviewed medications and she reports taking diltiazem instead of losartan. Discussed in detail medication changes and instructions for taking based on her cardiomyopathy. Instructed her the following instructions:  STOP taking all diltiazem  RESTART Losartan 25 mg and take half a tablet once daily START Carvedilol 6.25 mg and take twice a day  She was using up her Carvedilol 3.125 mg twice a day and taking 2 tablets (6.25 mg) twice a day and will call us when that is done to send in the Carvedilol 6.25 dose. Had her repeat all instructions and I could audibly hear her trying to write down as she reviewed. She then requested appointment to see Dr. Rockey Situ around September to follow up on these medication changes and to get a formal work up with EKG and discuss with provider. Scheduled and advised to call back if she should have any questions or concerns before then. She verbalized understanding of our conversation, agreeable with plan, and had no further questions at this time.

## 2020-12-17 NOTE — Telephone Encounter (Signed)
November 22, 2020  Minna Merritts, MD    8:05 PM In the setting of cardiomyopathy, low ejection fraction,  Diltiazem is actually not recommended  That is why we changed it to carvedilol and losartan both of which are recommended  She can take anything she would like but that is what we would recommend , carvedilol and losartan  If she would like a lower heart rate, carvedilol can be increased up to 6.25 twice daily   Thx  TGollan

## 2020-12-20 ENCOUNTER — Other Ambulatory Visit: Payer: Self-pay | Admitting: Internal Medicine

## 2020-12-20 NOTE — Telephone Encounter (Signed)
Last RF 09/21/20 #270  Refill due: yes Active med list: yes Future appointment: no Med not delegated to NT to RF Message sent to pt to make appt via MyChart  Requested Prescriptions  Pending Prescriptions Disp Refills   baclofen (LIORESAL) 10 MG tablet [Pharmacy Med Name: BACLOFEN 10 MG TABLET] 270 tablet 0    Sig: TAKE 1 TABLET BY MOUTH THREE TIMES A DAY      Not Delegated - Analgesics:  Muscle Relaxants Failed - 12/20/2020  9:16 AM      Failed - This refill cannot be delegated      Failed - Valid encounter within last 6 months    Recent Outpatient Visits           7 months ago Essential hypertension   [place], MD   9 months ago Diarrhea, unspecified type   [facility] Glean Hess, MD   1 year ago Essential hypertension   [facility] Glean Hess, MD   1 year ago Pain of right thigh   [facility] Glean Hess, MD   2 years ago Essential hypertension   [place], Laura H, MD       Future Appointments             In 1 month Brendolyn Patty, MD [place]   In 2 months [place], Kathlene November, MD [facility], [place]

## 2021-01-04 ENCOUNTER — Telehealth: Payer: Self-pay | Admitting: Cardiovascular Disease

## 2021-01-04 MED ORDER — CARVEDILOL 6.25 MG PO TABS: 6.2500 mg | ORAL_TABLET | Freq: Two times a day (BID) | ORAL | 0 refills | Status: DC

## 2021-01-04 NOTE — Telephone Encounter (Signed)
*  STAT* If patient is at the pharmacy, call can be transferred to refill team.   1. Which medications need to be refilled? (please list name of each medication and dose if known) Carvedilol, 1 tablet twice a day  2. Which pharmacy/location (including street and city if local pharmacy) is medication to be sent to? [facility] [place]  3. Do they need a 30 day or 90 day supply? 90 day

## 2021-01-04 NOTE — Telephone Encounter (Signed)
Requested Prescriptions   Signed Prescriptions Disp Refills   carvedilol (COREG) 6.25 MG tablet 180 tablet 0    Sig: Take 1 tablet (6.25 mg total) by mouth 2 (two) times daily with a meal.    Authorizing Provider: Minna Merritts    Ordering User: Raelene Bott, Tate Jerkins L

## 2021-01-13 ENCOUNTER — Other Ambulatory Visit: Payer: Self-pay | Admitting: Cardiovascular Disease

## 2021-01-20 ENCOUNTER — Other Ambulatory Visit: Payer: Self-pay

## 2021-01-20 ENCOUNTER — Emergency Department
Admission: EM | Admit: 2021-01-20 | Discharge: 2021-01-20 | Disposition: A | Discharge status: Home / Self Care | Payer: Medicare Other | Attending: Student in an Organized Health Care Education/Training Program | Admitting: Student in an Organized Health Care Education/Training Program

## 2021-01-20 ENCOUNTER — Ambulatory Visit: Payer: Self-pay

## 2021-01-20 DIAGNOSIS — Z96611 Presence of right artificial shoulder joint: Secondary | ICD-10-CM | POA: Diagnosis not present

## 2021-01-20 DIAGNOSIS — Z955 Presence of coronary angioplasty implant and graft: Secondary | ICD-10-CM | POA: Insufficient documentation

## 2021-01-20 DIAGNOSIS — Z85828 Personal history of other malignant neoplasm of skin: Secondary | ICD-10-CM | POA: Diagnosis not present

## 2021-01-20 DIAGNOSIS — Y92007 Garden or yard of unspecified non-institutional (private) residence as the place of occurrence of the external cause: Secondary | ICD-10-CM | POA: Diagnosis not present

## 2021-01-20 DIAGNOSIS — Z96651 Presence of right artificial knee joint: Secondary | ICD-10-CM | POA: Insufficient documentation

## 2021-01-20 DIAGNOSIS — Z7901 Long term (current) use of anticoagulants: Secondary | ICD-10-CM | POA: Insufficient documentation

## 2021-01-20 DIAGNOSIS — Z79899 Other long term (current) drug therapy: Secondary | ICD-10-CM | POA: Diagnosis not present

## 2021-01-20 DIAGNOSIS — J45909 Unspecified asthma, uncomplicated: Secondary | ICD-10-CM | POA: Diagnosis not present

## 2021-01-20 DIAGNOSIS — S50862A Insect bite (nonvenomous) of left forearm, initial encounter: Secondary | ICD-10-CM | POA: Insufficient documentation

## 2021-01-20 DIAGNOSIS — W57XXXA Bitten or stung by nonvenomous insect and other nonvenomous arthropods, initial encounter: Secondary | ICD-10-CM | POA: Diagnosis not present

## 2021-01-20 DIAGNOSIS — Z8616 Personal history of COVID-19: Secondary | ICD-10-CM | POA: Insufficient documentation

## 2021-01-20 DIAGNOSIS — J449 Chronic obstructive pulmonary disease, unspecified: Secondary | ICD-10-CM | POA: Insufficient documentation

## 2021-01-20 DIAGNOSIS — Z7951 Long term (current) use of inhaled steroids: Secondary | ICD-10-CM | POA: Insufficient documentation

## 2021-01-20 DIAGNOSIS — I10 Essential (primary) hypertension: Secondary | ICD-10-CM | POA: Diagnosis not present

## 2021-01-20 MED ORDER — SULFAMETHOXAZOLE-TRIMETHOPRIM 800-160 MG PO TABS: 1.0000 | ORAL_TABLET | Freq: Two times a day (BID) | ORAL | 0 refills | Status: AC

## 2021-01-20 MED ORDER — PREDNISONE 10 MG PO TABS: ORAL_TABLET | ORAL | 0 refills | Status: DC

## 2021-01-20 MED ORDER — PREDNISONE 20 MG PO TABS
40.0000 mg | ORAL_TABLET | Freq: Once | ORAL | Status: AC
  Administered 2021-01-20: 40 mg via ORAL
  Filled 2021-01-20: qty 2

## 2021-01-20 MED ORDER — DIPHENHYDRAMINE HCL 25 MG PO CAPS
25.0000 mg | ORAL_CAPSULE | Freq: Once | ORAL | Status: AC
  Administered 2021-01-20: 25 mg via ORAL
  Filled 2021-01-20: qty 1

## 2021-01-20 MED ORDER — FAMOTIDINE 20 MG PO TABS
40.0000 mg | ORAL_TABLET | Freq: Once | ORAL | Status: AC
  Administered 2021-01-20: 40 mg via ORAL
  Filled 2021-01-20: qty 2

## 2021-01-20 MED ORDER — SULFAMETHOXAZOLE-TRIMETHOPRIM 800-160 MG PO TABS
1.0000 | ORAL_TABLET | Freq: Once | ORAL | Status: AC
  Administered 2021-01-20: 1 via ORAL
  Filled 2021-01-20: qty 1

## 2021-01-20 MED ORDER — FAMOTIDINE 20 MG PO TABS: 20.0000 mg | ORAL_TABLET | Freq: Two times a day (BID) | ORAL | 0 refills | Status: DC

## 2021-01-20 NOTE — ED Provider Notes (Signed)
[facility] Emergency Department Provider Note  ____________________________________________   Event Date/Time   First MD Initiated Contact with Patient 01/20/21 1703     (approximate)  I have reviewed the triage vital signs and the nursing notes.   HISTORY  Chief Complaint Insect Bite   HPI Audrey Martinez is a 75 y.o. female who presents to the emergency department for evaluation of insect bite to the left forearm that occurred 2 days ago.  Patient states that she noticed pruritic erythema to the area of the left proximal forearm 2 days ago when she was standing near her neighbor who was weed whacking grass next to her.  She is unsure of any specific bug bite, but states that she has a history of "allergies to everything".  She reports swelling and erythema of the area have been mildly worsening today and she noted some "scratchiness" of her throat as well as intermittent tingling of her bottom lip that she reports has occurred with previous allergic reactions.  She denies any chest pain, shortness of breath, difficulty swallowing or managing secretions, abdominal pain, nausea vomiting or diarrhea, or other areas of rash.         Past Medical History:  Diagnosis Date   Actinic keratosis    Acute pulmonary embolus ([place]) 01/29/2020   Acute renal failure (ARF) ([place]) 01/31/2020   Acute respiratory failure with hypoxia ([place]) 01/29/2020   Acute septic pulmonary embolism (HCC)    AKI (acute kidney injury) ([place])    Amaurosis fugax, right eye 12/2018   Arthritis    Asthma    Basal cell carcinoma 07/2016   central forehead (tx in [place])   Cardiomyopathy [facility])    a. 04/2018 Echo: EF 45-50%. Anteroseptal and apical HK in some views. Mild AI/MR. Nl RV fxn; b. 05/2018 MV: mid-dist ant and antsept mild defect - ? breast atten. EF 41%. No ischemia; c. 03/2019 Echo: EF 30-35%, Gr2 DD, Trace MR, triv TR. Asc Ao ectatic dil - 79m.    GERD (gastroesophageal reflux  disease)    Herniated disc, cervical    Hyperlipidemia    Left wrist fracture    Melanoma ([place] 05/12/2020   Melanoma IS R post neck, excised 08/10/20   Osteoporosis    Pneumonia due to COVID-19 virus 01/26/2020   Stroke ([facility] 1998   SVT (supraventricular tachycardia) ([facility]    a.2008 s/p RFCA for AVNRT; b. 9 & 02/2019 Zio x 2: 1. Avg HR 89, 3 runs NSVT (longest 7 beats), [place] 1. 2. Avg HR 89, NSVT x 1 (5 beats), Mobitz 1. No signif arrhythmia.    Patient Active Problem List   Diagnosis Date Noted   Pulmonary embolus ([place] 01/31/2020   Atrial flutter ([place] 01/31/2020   Obesity (BMI 30-39.9) 01/31/2020   Left lower lobe pulmonary nodule 01/29/2020   Chronic obstructive pulmonary disease, unspecified COPD type ([facility] 10/08/2019   Dilated cardiomyopathy ([facility] 05/02/2019   Amaurosis fugax of right eye 01/13/2019   Arm pain, right 11/08/2017   Renal insufficiency 11/08/2017   Tendinitis of right knee 07/03/2017   Lumbar radiculopathy 05/18/2017   Status post hemilaminotomy 05/18/2017   Lumbar degenerative disc disease 05/18/2017   Spinal stenosis of lumbar region without neurogenic claudication 05/18/2017   Atrial tachycardia ([place] 04/14/2017   SVT (supraventricular tachycardia) ([place] 04/14/2017   Essential hypertension 04/14/2017   Intermittent asthma without complication [id number]  Osteoporosis 03/21/2017   Benign essential tremor 03/21/2017   Hx of atrioventricular node ablation  02/01/2008   Hyperlipidemia 05/12/2003   Personal history of transient ischemic attack (TIA), and cerebral infarction without residual deficits 04/22/2003    Past Surgical History:  Procedure Laterality Date   BACK SURGERY  2017   L2-4 laminectomy and foraminal stenosis   BREAST CYST ASPIRATION Left 1977   CARDIAC CATHETERIZATION  2009   [facility] in [place]  2009   CATARACT EXTRACTION, BILATERAL  2010   COLONOSCOPY WITH  PROPOFOL N/A 03/21/2018   Procedure: COLONOSCOPY WITH Biopsy;  Surgeon: Lin Landsman, MD;  Location: [employer];  Service: Endoscopy;  Laterality: N/A;   derrick procedure  1977-78   bilateral    ESOPHAGOGASTRODUODENOSCOPY  2013   gastritis; done for complaint of dysphagia   ESOPHAGOGASTRODUODENOSCOPY (EGD) WITH PROPOFOL N/A 03/21/2018   Procedure: ESOPHAGOGASTRODUODENOSCOPY (EGD) WITH Biopsies;  Surgeon: Lin Landsman, MD;  Location: [facility];  Service: Endoscopy;  Laterality: N/A;  KEEP THIS PATIENT FIRST   LEFT HEART CATH AND CORONARY ANGIOGRAPHY Left 05/02/2019   Procedure: LEFT HEART CATH AND CORONARY ANGIOGRAPHY;  Surgeon: Minna Merritts, MD;  Location: [place] [facility];  Service: Cardiovascular;  Laterality: Left;   lymph node removal     neck   MOHS SURGERY  07/2016   BCCA nose   POLYPECTOMY N/A 03/21/2018   Procedure: POLYPECTOMY;  Surgeon: Lin Landsman, MD;  Location: [place];  Service: Endoscopy;  Laterality: N/A;   REPLACEMENT TOTAL KNEE  2013   RT   TOTAL SHOULDER REPLACEMENT  2012   RT     Prior to Admission medications   Medication Sig Start Date End Date Taking? Authorizing Provider  famotidine (PEPCID) 20 MG tablet Take 1 tablet (20 mg total) by mouth 2 (two) times daily for 10 days. 01/20/21 01/30/21 Yes Vincente Asbridge, Farrel Gordon, PA  predniSONE (DELTASONE) 10 MG tablet Take 40 mg on day 1 and decrease by '10mg'$  each day -- 40, then 30, then 20, then 10 for a total of 4 days. 01/20/21  Yes Marlana Salvage, PA  sulfamethoxazole-trimethoprim (BACTRIM DS) 800-160 MG tablet Take 1 tablet by mouth 2 (two) times daily for 5 days. 01/20/21 01/25/21 Yes Raneshia Derick, Farrel Gordon, PA  albuterol (PROVENTIL HFA;VENTOLIN HFA) 108 (90 Base) MCG/ACT inhaler Inhale 1-2 puffs into the lungs every 6 (six) hours as needed for wheezing or shortness of breath.     [provider]  B Complex-C (B-COMPLEX WITH VITAMIN C) tablet Take 1 tablet by  mouth daily.     [provider]  baclofen (LIORESAL) 10 MG tablet TAKE 1 TABLET BY MOUTH THREE TIMES A DAY 12/21/20   Glean Hess, MD  Bioflavonoid Products (ESTER-C) TABS Take 1 tablet by mouth 2 (two) times daily.     [provider]  calcium carbonate (OS-CAL - DOSED IN MG OF ELEMENTAL CALCIUM) 1250 (500 Ca) MG tablet Take 1 tablet by mouth.    [provider]  carvedilol (COREG) 6.25 MG tablet Take 1 tablet (6.25 mg total) by mouth 2 (two) times daily with a meal. 01/04/21   Gollan, Kathlene November, MD  cholecalciferol (VITAMIN D) 25 MCG (1000 UT) tablet Take 1,000 Units by mouth daily.    [provider]  Cranberry 1000 MG CAPS Take 1,000 mg by mouth daily.     [provider]  ezetimibe (ZETIA) 10 MG tablet TAKE 1 TABLET BY MOUTH EVERY DAY 10/29/20   Glean Hess, MD  fluticasone (FLOVENT HFA) 110 MCG/ACT inhaler Inhale 2 puffs into the lungs 2 (two) times daily. 07/23/20   Flora Lipps, MD  fluticasone (FLOVENT HFA) 220 MCG/ACT inhaler Inhale into the lungs 2 (two) times daily.    [provider]  gabapentin (NEURONTIN) 300 MG capsule TAKE 1 CAPSULE BY MOUTH 3 TIMES DAILY. AND TAKE 3 CAPSULES DAILY AT BEDTIME. 06/22/20   Glean Hess, MD  Ginger, Zingiber officinalis, (GINGER PO) Take 1,100 mg by mouth daily.     [provider]  losartan (COZAAR) 25 MG tablet TAKE 1/2 TABLET BY MOUTH EVERY DAY 08/03/20   Minna Merritts, MD  MAG ASPART-POTASSIUM ASPART PO Take 1 tablet by mouth 2 (two) times daily. 600 mg-198 mg    [provider]  Misc Natural Products (TART CHERRY ADVANCED PO) Take 1 capsule by mouth daily.     [provider]  montelukast (SINGULAIR) 10 MG tablet Take 10 mg by mouth every evening. 10/25/18   [provider]  Multiple Vitamins-Minerals (MULTIVITAMIN WITH MINERALS) tablet Take 1 tablet by mouth daily. Centrum Silver    [provider]  mupirocin ointment (BACTROBAN) 2 %  Apply 1 application topically daily. Qd to excision site with dressing changes 08/10/20   Brendolyn Patty, MD  Probiotic Product (PROBIOTIC DAILY PO) Take 1 capsule by mouth 2 (two) times a week.     [provider]  rivaroxaban (XARELTO) 10 MG TABS tablet Take 1 tablet (10 mg total) by mouth daily. 09/14/20   Minna Merritts, MD  STRIVERDI RESPIMAT 2.5 MCG/ACT AERS INHALE 2 PUFFS INTO THE LUNGS DAILY. USE AT THE SAME TIME EVERYDAY 07/15/20   Flora Lipps, MD  traMADol (ULTRAM) 50 MG tablet Take 50 mg by mouth every 6 (six) hours as needed. 04/08/20   [provider]  Turmeric 500 MG CAPS Take 500 mg by mouth daily.     [provider]  zinc sulfate 220 (50 Zn) MG capsule Take 1 capsule (220 mg total) by mouth daily. 02/02/20   Allie Bossier, MD    Allergies Amoxicillin, Aspirin, Ciprofloxacin, Ciprofloxacin-dexamethasone, Crestor [rosuvastatin], Morphine and related, Doxycycline hyclate, Hydrochlorothiazide, Keflex [cephalexin], Lovastatin, Simvastatin, Erythromycin, Ibuprofen, Neomycin-bacitracin zn-polymyx, Omeprazole, and Tape  Family History  Problem Relation Age of Onset   Diabetes Mother    Stroke Mother    Valvular heart disease Father    CAD Sister 20       died of MI   Stroke Sister    Stroke Sister 15   Clotting disorder Half-Sister    Clotting disorder Half-Sister    Breast cancer Neg Hx     Social History Social History   Tobacco Use   Smoking status: Never   Smokeless tobacco: Never  Vaping Use   Vaping Use: Never used  Substance Use Topics   Alcohol use: Not Currently    Comment: may drink 1-2x/yr   Drug use: No    Review of Systems Constitutional: No fever/chills Eyes: No visual changes. ENT: + Throat "scratchiness",+ intermittent lip tingling Cardiovascular: Denies chest pain. Respiratory: Denies shortness of breath. Gastrointestinal: No abdominal pain.  No nausea, no vomiting.  No diarrhea.  No constipation. Genitourinary:  Negative for dysuria. Musculoskeletal: Negative for back pain. Skin: + Left forearm rash Neurological: Negative for headaches, focal weakness or numbness.  ____________________________________________   PHYSICAL EXAM:  VITAL SIGNS: ED Triage Vitals  Enc Vitals Group     BP 01/20/21 1657 (!) 131/99     Pulse  Rate 01/20/21 1657 96     Resp 01/20/21 1657 18     Temp 01/20/21 1657 98.7 F (37.1 C)     Temp Source 01/20/21 1657 Oral     SpO2 01/20/21 1657 96 %     Weight 01/20/21 1656 230 lb (104.3 kg)     Height 01/20/21 1656 '5\' 7"'$  (1.702 m)     Head Circumference --      Peak Flow --      Pain Score 01/20/21 1656 0     Pain Loc --      Pain Edu? --      Excl. in [place]? --    Constitutional: Alert and oriented. Well appearing and in no acute distress. Eyes: Conjunctivae are normal. PERRL. EOMI. Head: Atraumatic. Nose: No congestion/rhinnorhea. Mouth/Throat: Mucous membranes are moist.  Oropharynx non-erythematous.  No swelling noted of the upper or bottom lip, no other facial swelling noted. Neck: No stridor.   Cardiovascular: Normal rate, regular rhythm. Grossly normal heart sounds.  Good peripheral circulation. Respiratory: Normal respiratory effort.  No retractions. Lungs CTAB. Gastrointestinal: Soft and nontender. No distention. No abdominal bruits. No CVA tenderness. Musculoskeletal: Full range of motion of the left upper extremity, radial pulse 2+.  No lower extremity tenderness nor edema.  No joint effusions. Neurologic:  Normal speech and language.  Cranial nerves II through XII grossly intact, no gross focal neurologic deficits are appreciated. No gait instability. Skin: There is a raised area to the left lateral proximal forearm likely representing bug bite with surrounding erythema.  Area is tender, but no fluctuance noted. Psychiatric: Mood and affect are normal. Speech and behavior are normal.   ____________________________________________   INITIAL IMPRESSION /  ASSESSMENT AND PLAN / ED COURSE  As part of my medical decision making, I reviewed the following data within the [place] notes reviewed and incorporated and Notes from prior ED visits        Patient is a 75 year old female who presents to the emergency department for evaluation of tenderness and erythema over area of possible bug bite as well as possible allergic reaction symptoms.  See HPI for further details.  In triage, patient is mildly hypertensive otherwise has normal vital signs.  Specifically, the patient denies any chest pain, shortness of breath, difficulty managing secretions, abdominal pain, nausea vomiting or diarrhea or other rash.  However, she does endorse "scratchiness" of her throat as well as intermittent tingling of her bottom lip without associated swelling.  On physical exam, cranial nerves II through XII grossly intact, no significant facial edema.  There is a rash to the left proximal forearm as described above, no disseminated rash.  Differentials considered include early cellulitis at site of probable bug bite versus possible allergic reaction.  Will treat with short course of steroids, Pepcid, will choose Bactrim for antibiotic given numerous other allergies.  Return precautions were discussed at length, 1 hour after administration of medications here, patient is reporting significant improvement in her symptoms.  She stable this time for outpatient follow-up.      ____________________________________________   FINAL CLINICAL IMPRESSION(S) / ED DIAGNOSES  Final diagnoses:  Insect bite of left forearm, initial encounter     ED Discharge Orders          Ordered    sulfamethoxazole-trimethoprim (BACTRIM DS) 800-160 MG tablet  2 times daily        01/20/21 1834    predniSONE (DELTASONE) 10 MG tablet  01/20/21 1834    famotidine (PEPCID) 20 MG tablet  2 times daily        01/20/21 1834             Note:  This document  was prepared using Dragon voice recognition software and may include unintentional dictation errors.    Marlana Salvage, PA 01/20/21 Peterson Ao, MD 01/20/21 [phone]

## 2021-01-20 NOTE — Telephone Encounter (Signed)
Patient called in to say that she have some type of insect bite on her left arm. Say that the area is a bit swollen and red about 3 inches around and  she is having some anxiety Please call Ph# [phone]  Called patient to review symptoms. No answer, LVMTCB .

## 2021-01-20 NOTE — Discharge Instructions (Addendum)
Please take Bactrim (antibiotic) twice daily as prescribed. You've also been prescribed a 4 day course of prednisone. Take 40 mg on day 1, '30mg'$  on day 2, 20 mg on day 3, and 10 mg on day 4. You have also been prescribed Pepcid, '20mg'$  to take twice daily. In addition, you may take Benadryl, 25 mg up to 3 x daily. Please use caution with this medication as it may make you at increased risk for falls.

## 2021-01-20 NOTE — ED Triage Notes (Signed)
Pt comes pov from home with insect bite to left forearm. Has red rash around it. Pt states it bit her about 2 days ago in her yard. Since then, has sometimes felt some tingling in her throat.

## 2021-01-20 NOTE — Telephone Encounter (Signed)
Patient called, left VM to return the call to the office to discuss symptoms with a nurse.    Message from Jodie Echevaria sent at 01/20/2021  3:37 PM EDT  Patient called in to say that she have some type of insect bite on her left arm. Say that the area is a bit swollen and red about 3 inches around and  she is having some anxiety Please call Ph# [phone]

## 2021-01-20 NOTE — Telephone Encounter (Signed)
3 rd attempt to contact patient to review insect bite to left arm that is swollen, red and has anxiety. No answer, left voicemail to contact clinic .

## 2021-01-21 NOTE — Telephone Encounter (Signed)
Called pt she said that she went to [facility] yesterday and is currently taking medication for the insect bite. Pt stated she feels better.  KP

## 2021-01-22 ENCOUNTER — Other Ambulatory Visit: Payer: Self-pay

## 2021-01-22 ENCOUNTER — Emergency Department: Payer: Medicare Other

## 2021-01-22 ENCOUNTER — Encounter: Payer: Self-pay | Admitting: Emergency Medicine

## 2021-01-22 ENCOUNTER — Emergency Department
Admission: EM | Admit: 2021-01-22 | Discharge: 2021-01-22 | Disposition: A | Discharge status: Home / Self Care | Payer: Medicare Other | Attending: Emergency Medicine | Admitting: Emergency Medicine

## 2021-01-22 DIAGNOSIS — Z7901 Long term (current) use of anticoagulants: Secondary | ICD-10-CM | POA: Diagnosis not present

## 2021-01-22 DIAGNOSIS — S60221A Contusion of right hand, initial encounter: Secondary | ICD-10-CM | POA: Diagnosis not present

## 2021-01-22 DIAGNOSIS — S065XAA Traumatic subdural hemorrhage with loss of consciousness status unknown, initial encounter: Secondary | ICD-10-CM

## 2021-01-22 DIAGNOSIS — Z96652 Presence of left artificial knee joint: Secondary | ICD-10-CM | POA: Insufficient documentation

## 2021-01-22 DIAGNOSIS — J4520 Mild intermittent asthma, uncomplicated: Secondary | ICD-10-CM | POA: Insufficient documentation

## 2021-01-22 DIAGNOSIS — Z7951 Long term (current) use of inhaled steroids: Secondary | ICD-10-CM | POA: Diagnosis not present

## 2021-01-22 DIAGNOSIS — J449 Chronic obstructive pulmonary disease, unspecified: Secondary | ICD-10-CM | POA: Insufficient documentation

## 2021-01-22 DIAGNOSIS — Z79899 Other long term (current) drug therapy: Secondary | ICD-10-CM | POA: Insufficient documentation

## 2021-01-22 DIAGNOSIS — Z8616 Personal history of COVID-19: Secondary | ICD-10-CM | POA: Diagnosis not present

## 2021-01-22 DIAGNOSIS — Z85828 Personal history of other malignant neoplasm of skin: Secondary | ICD-10-CM | POA: Insufficient documentation

## 2021-01-22 DIAGNOSIS — Z85820 Personal history of malignant melanoma of skin: Secondary | ICD-10-CM | POA: Diagnosis not present

## 2021-01-22 DIAGNOSIS — S0993XA Unspecified injury of face, initial encounter: Secondary | ICD-10-CM | POA: Diagnosis not present

## 2021-01-22 DIAGNOSIS — S0083XA Contusion of other part of head, initial encounter: Secondary | ICD-10-CM | POA: Diagnosis not present

## 2021-01-22 DIAGNOSIS — Z86711 Personal history of pulmonary embolism: Secondary | ICD-10-CM | POA: Insufficient documentation

## 2021-01-22 DIAGNOSIS — Z96611 Presence of right artificial shoulder joint: Secondary | ICD-10-CM | POA: Insufficient documentation

## 2021-01-22 DIAGNOSIS — S0990XA Unspecified injury of head, initial encounter: Secondary | ICD-10-CM | POA: Diagnosis present

## 2021-01-22 DIAGNOSIS — S0181XA Laceration without foreign body of other part of head, initial encounter: Secondary | ICD-10-CM | POA: Diagnosis not present

## 2021-01-22 DIAGNOSIS — G9389 Other specified disorders of brain: Secondary | ICD-10-CM | POA: Diagnosis not present

## 2021-01-22 DIAGNOSIS — M79641 Pain in right hand: Secondary | ICD-10-CM | POA: Diagnosis not present

## 2021-01-22 DIAGNOSIS — W01198A Fall on same level from slipping, tripping and stumbling with subsequent striking against other object, initial encounter: Secondary | ICD-10-CM | POA: Insufficient documentation

## 2021-01-22 DIAGNOSIS — M7989 Other specified soft tissue disorders: Secondary | ICD-10-CM | POA: Diagnosis not present

## 2021-01-22 DIAGNOSIS — M79601 Pain in right arm: Secondary | ICD-10-CM | POA: Diagnosis not present

## 2021-01-22 DIAGNOSIS — S065X0A Traumatic subdural hemorrhage without loss of consciousness, initial encounter: Secondary | ICD-10-CM | POA: Diagnosis not present

## 2021-01-22 DIAGNOSIS — Z8673 Personal history of transient ischemic attack (TIA), and cerebral infarction without residual deficits: Secondary | ICD-10-CM | POA: Insufficient documentation

## 2021-01-22 LAB — CBC
HCT: 45.8 % (ref 36.0–46.0)
Hemoglobin: 15.1 g/dL — ABNORMAL HIGH (ref 12.0–15.0)
MCH: 30.5 pg (ref 26.0–34.0)
MCHC: 33 g/dL (ref 30.0–36.0)
MCV: 92.5 fL (ref 80.0–100.0)
Platelets: 236 10*3/uL (ref 150–400)
RBC: 4.95 MIL/uL (ref 3.87–5.11)
RDW: 12.4 % (ref 11.5–15.5)
WBC: 12.2 10*3/uL — ABNORMAL HIGH (ref 4.0–10.5)
nRBC: 0 % (ref 0.0–0.2)

## 2021-01-22 LAB — TYPE AND SCREEN
ABO/RH(D): O POS
Antibody Screen: NEGATIVE

## 2021-01-22 LAB — PROTIME-INR
INR: 1.3 — ABNORMAL HIGH (ref 0.8–1.2)
Prothrombin Time: 16.1 seconds — ABNORMAL HIGH (ref 11.4–15.2)

## 2021-01-22 LAB — COMPREHENSIVE METABOLIC PANEL
ALT: 20 U/L (ref 0–44)
AST: 27 U/L (ref 15–41)
Albumin: 4.2 g/dL (ref 3.5–5.0)
Alkaline Phosphatase: 68 U/L (ref 38–126)
Anion gap: 7 (ref 5–15)
BUN: 20 mg/dL (ref 8–23)
CO2: 27 mmol/L (ref 22–32)
Calcium: 9.1 mg/dL (ref 8.9–10.3)
Chloride: 106 mmol/L (ref 98–111)
Creatinine, Ser: 1.16 mg/dL — ABNORMAL HIGH (ref 0.44–1.00)
GFR, Estimated: 50 mL/min — ABNORMAL LOW (ref >60)
Glucose, Bld: 107 mg/dL — ABNORMAL HIGH (ref 70–99)
Potassium: 4.2 mmol/L (ref 3.5–5.1)
Sodium: 140 mmol/L (ref 135–145)
Total Bilirubin: 0.8 mg/dL (ref 0.3–1.2)
Total Protein: 7 g/dL (ref 6.5–8.1)

## 2021-01-22 LAB — APTT: aPTT: 32 seconds (ref 24–36)

## 2021-01-22 MED ORDER — EMPTY CONTAINERS FLEXIBLE MISC: 1800.0000 mg | Freq: Once | Status: DC

## 2021-01-22 NOTE — ED Provider Notes (Signed)
-----------------------------------------   6:07 PM on 01/22/2021 -----------------------------------------  I took over care of this patient from Dr. Corky Downs.  Repeat CT after 6 hours shows a stable subdural hematoma with no significant change from the earlier CT.  On reassessment the patient states that her headache is much better.  She is alert and oriented and feels well.  I discussed the case with Dr. Johnney Killian from neurosurgery who agreed with discharge as per the original plan.  The patient is stable for discharge home at this time.  I counseled her on the results of the work-up and the follow-up plan.  She was advised to stop the Xarelto for the next week.  Return precautions given, and she expressed understanding.   Arta Silence, MD 01/22/21 [phone]

## 2021-01-22 NOTE — Discharge Instructions (Addendum)
Your CT scan shows a small area of bleeding in the brain called a subdural hematoma.  You should follow-up with the neurosurgeon within the next week.  Return to the ER immediately for new, worsening, or persistent severe headache, blurred vision or other vision changes, vomiting, weakness or strokelike symptoms, or any other new or worsening symptoms that concern you.  You should hold your Xarelto for 1 week or until you follow-up with the neurosurgeon.

## 2021-01-22 NOTE — ED Notes (Signed)
Patient repositioned in the bed and given pillow/warm blankets. No other needs expressed at this time. No signs of distress. Respirations even and unlabored.

## 2021-01-22 NOTE — ED Notes (Signed)
Patient to CT at this time

## 2021-01-22 NOTE — ED Provider Notes (Signed)
[facility] Emergency Department Provider Note   ____________________________________________    I have reviewed the triage vital signs and the nursing notes.   HISTORY  Chief Complaint Fall     HPI Audrey Martinez is a 75 y.o. female who presents after mechanical fall.  Patient reports she tripped on the edge of her carpet, fell forward hit her right hand on the carpet and banged her chin on the hard floor.  She denies neck pain.  She complains of minimal headache.  Primary complaint is right hand pain and small chin laceration.  She is on Xarelto.  No neurodeficits  Past Medical History:  Diagnosis Date   Actinic keratosis    Acute pulmonary embolus ([place]) 01/29/2020   Acute renal failure (ARF) ([place]) 01/31/2020   Acute respiratory failure with hypoxia ([place]) 01/29/2020   Acute septic pulmonary embolism (HCC)    AKI (acute kidney injury) ([place])    Amaurosis fugax, right eye 12/2018   Arthritis    Asthma    Basal cell carcinoma 07/2016   central forehead (tx in [place])   Cardiomyopathy [facility])    a. 04/2018 Echo: EF 45-50%. Anteroseptal and apical HK in some views. Mild AI/MR. Nl RV fxn; b. 05/2018 MV: mid-dist ant and antsept mild defect - ? breast atten. EF 41%. No ischemia; c. 03/2019 Echo: EF 30-35%, Gr2 DD, Trace MR, triv TR. Asc Ao ectatic dil - 10m.    GERD (gastroesophageal reflux disease)    Herniated disc, cervical    Hyperlipidemia    Left wrist fracture    Melanoma ([place] 05/12/2020   Melanoma IS R post neck, excised 08/10/20   Osteoporosis    Pneumonia due to COVID-19 virus 01/26/2020   Stroke ([place] 1998   SVT (supraventricular tachycardia) ([place]    a.2008 s/p RFCA for AVNRT; b. 9 & 02/2019 [place] x 2: 1. Avg HR 89, 3 runs NSVT (longest 7 beats), [place] 1. 2. Avg HR 89, NSVT x 1 (5 beats), [place] 1. No signif arrhythmia.    Patient Active Problem List   Diagnosis Date Noted   Pulmonary embolus ([place] 01/31/2020   Atrial flutter ([place]  01/31/2020   Obesity (BMI 30-39.9) 01/31/2020   Left lower lobe pulmonary nodule 01/29/2020   Chronic obstructive pulmonary disease, unspecified COPD type ([place] 10/08/2019   Dilated cardiomyopathy ([place] 05/02/2019   Amaurosis fugax of right eye 01/13/2019   Arm pain, right 11/08/2017   Renal insufficiency 11/08/2017   Tendinitis of right knee 07/03/2017   Lumbar radiculopathy 05/18/2017   Status post hemilaminotomy 05/18/2017   Lumbar degenerative disc disease 05/18/2017   Spinal stenosis of lumbar region without neurogenic claudication 05/18/2017   Atrial tachycardia ([place] 04/14/2017   SVT (supraventricular tachycardia) ([place] 04/14/2017   Essential hypertension 04/14/2017   Intermittent asthma without complication [id number]  Osteoporosis 03/21/2017   Benign essential tremor 03/21/2017   Hx of atrioventricular node ablation 02/01/2008   Hyperlipidemia 05/12/2003   Personal history of transient ischemic attack (TIA), and cerebral infarction without residual deficits 04/22/2003    Past Surgical History:  Procedure Laterality Date   BACK SURGERY  2017   L2-4 laminectomy and foraminal stenosis   BREAST CYST ASPIRATION Left [place] 2009   [facility] [place] 2009   CATARACT EXTRACTION, BILATERAL  2010   COLONOSCOPY WITH PROPOFOL N/A 03/21/2018   Procedure: COLONOSCOPY WITH Biopsy;  Surgeon: VSherri Sear  Reece Levy, MD;  Location: [place];  Service: Endoscopy;  Laterality: N/A;   derrick procedure  1977-78   bilateral    ESOPHAGOGASTRODUODENOSCOPY  2013   gastritis; done for complaint of dysphagia   ESOPHAGOGASTRODUODENOSCOPY (EGD) WITH PROPOFOL N/A 03/21/2018   Procedure: ESOPHAGOGASTRODUODENOSCOPY (EGD) WITH Biopsies;  Surgeon: Lin Landsman, MD;  Location: [place];  Service: Endoscopy;  Laterality: N/A;  KEEP THIS PATIENT FIRST   LEFT HEART CATH AND  CORONARY ANGIOGRAPHY Left 05/02/2019   Procedure: LEFT HEART CATH AND CORONARY ANGIOGRAPHY;  Surgeon: Minna Merritts, MD;  Location: [place] [facility];  Service: Cardiovascular;  Laterality: Left;   lymph node removal     neck   MOHS SURGERY  07/2016   BCCA nose   POLYPECTOMY N/A 03/21/2018   Procedure: POLYPECTOMY;  Surgeon: Lin Landsman, MD;  Location: [place];  Service: Endoscopy;  Laterality: N/A;   REPLACEMENT TOTAL KNEE  2013   RT   TOTAL SHOULDER REPLACEMENT  2012   RT     Prior to Admission medications   Medication Sig Start Date End Date Taking? Authorizing Provider  B Complex-C (B-COMPLEX WITH VITAMIN C) tablet Take 1 tablet by mouth daily.    Yes [provider]  baclofen (LIORESAL) 10 MG tablet TAKE 1 TABLET BY MOUTH THREE TIMES A DAY 12/21/20  Yes Glean Hess, MD  Bioflavonoid Products (ESTER-C) TABS Take 1 tablet by mouth 2 (two) times daily.    Yes [provider]  calcium carbonate (OS-CAL - DOSED IN MG OF ELEMENTAL CALCIUM) 1250 (500 Ca) MG tablet Take 1 tablet by mouth.   Yes [provider]  carvedilol (COREG) 6.25 MG tablet Take 1 tablet (6.25 mg total) by mouth 2 (two) times daily with a meal. Patient taking differently: Take 3.125 mg by mouth 2 (two) times daily with a meal. 01/04/21  Yes Gollan, Kathlene November, MD  cholecalciferol (VITAMIN D) 25 MCG (1000 UT) tablet Take 1,000 Units by mouth daily.   Yes [provider]  Cranberry 1000 MG CAPS Take 1,000 mg by mouth daily.    Yes [provider]  ezetimibe (ZETIA) 10 MG tablet TAKE 1 TABLET BY MOUTH EVERY DAY 10/29/20  Yes Glean Hess, MD  famotidine (PEPCID) 20 MG tablet Take 1 tablet (20 mg total) by mouth 2 (two) times daily for 10 days. 01/20/21 01/30/21 Yes Rodgers, Farrel Gordon, PA  gabapentin (NEURONTIN) 300 MG capsule TAKE 1 CAPSULE BY MOUTH 3 TIMES DAILY. AND TAKE 3 CAPSULES DAILY AT BEDTIME. 06/22/20  Yes Glean Hess, MD  Ginger, Zingiber  officinalis, (GINGER PO) Take 1,100 mg by mouth daily.    Yes [provider]  losartan (COZAAR) 25 MG tablet TAKE 1/2 TABLET BY MOUTH EVERY DAY 08/03/20  Yes Gollan, Kathlene November, MD  MAG ASPART-POTASSIUM ASPART PO Take 1 tablet by mouth 2 (two) times daily. 600 mg-198 mg   Yes [provider]  Misc Natural Products (TART CHERRY ADVANCED PO) Take 1 capsule by mouth daily.    Yes [provider]  montelukast (SINGULAIR) 10 MG tablet Take 10 mg by mouth every evening. 10/25/18  Yes [provider]  Multiple Vitamins-Minerals (MULTIVITAMIN WITH MINERALS) tablet Take 1 tablet by mouth daily. Centrum Silver   Yes [provider]  predniSONE (DELTASONE) 10 MG tablet Take 40 mg on day 1 and decrease by '10mg'$  each day -- 40, then 30, then 20, then 10 for a total of 4  days. 01/20/21  Yes Marlana Salvage, PA  Probiotic Product (PROBIOTIC DAILY PO) Take 1 capsule by mouth 2 (two) times a week.    Yes [provider]  rivaroxaban (XARELTO) 10 MG TABS tablet Take 1 tablet (10 mg total) by mouth daily. 09/14/20  Yes Minna Merritts, MD  sulfamethoxazole-trimethoprim (BACTRIM DS) 800-160 MG tablet Take 1 tablet by mouth 2 (two) times daily for 5 days. 01/20/21 01/25/21 Yes Marlana Salvage, PA  Turmeric 500 MG CAPS Take 500 mg by mouth daily.    Yes [provider]  zinc sulfate 220 (50 Zn) MG capsule Take 1 capsule (220 mg total) by mouth daily. 02/02/20  Yes Allie Bossier, MD  albuterol (PROVENTIL HFA;VENTOLIN HFA) 108 (90 Base) MCG/ACT inhaler Inhale 1-2 puffs into the lungs every 6 (six) hours as needed for wheezing or shortness of breath.     [provider]  fluticasone (FLOVENT HFA) 110 MCG/ACT inhaler Inhale 2 puffs into the lungs 2 (two) times daily. Patient not taking: No sig reported 07/23/20   Flora Lipps, MD  mupirocin ointment (BACTROBAN) 2 % Apply 1 application topically daily. Qd to excision site with dressing changes Patient not  taking: No sig reported 08/10/20   Brendolyn Patty, MD  STRIVERDI RESPIMAT 2.5 MCG/ACT AERS INHALE 2 PUFFS INTO THE LUNGS DAILY. USE AT THE SAME TIME EVERYDAY Patient not taking: No sig reported 07/15/20   Flora Lipps, MD     Allergies Amoxicillin, Aspirin, Ciprofloxacin, Ciprofloxacin-dexamethasone, Crestor [rosuvastatin], Morphine and related, Doxycycline hyclate, Hydrochlorothiazide, Keflex [cephalexin], Lovastatin, Simvastatin, Erythromycin, Ibuprofen, Neomycin-bacitracin zn-polymyx, Omeprazole, and Tape  Family History  Problem Relation Age of Onset   Diabetes Mother    Stroke Mother    Valvular heart disease Father    CAD Sister 16       died of MI   Stroke Sister    Stroke Sister 4   Clotting disorder Half-Sister    Clotting disorder Half-Sister    Breast cancer Neg Hx     Social History Social History   Tobacco Use   Smoking status: Never   Smokeless tobacco: Never  Vaping Use   Vaping Use: Never used  Substance Use Topics   Alcohol use: Not Currently    Comment: may drink 1-2x/yr   Drug use: No    Review of Systems  Constitutional: No fever/chills Eyes: No visual changes.  ENT: No sore throat. Cardiovascular: Denies chest pain. Respiratory: Denies shortness of breath. Gastrointestinal: No abdominal pain.  No nausea, no vomiting.   Genitourinary: Negative for dysuria. Musculoskeletal: No back pain, right hand injury as above Skin: As above Neurological: As above   ____________________________________________   PHYSICAL EXAM:  VITAL SIGNS: ED Triage Vitals  Enc Vitals Group     BP 01/22/21 1200 119/88     Pulse Rate 01/22/21 1200 94     Resp 01/22/21 1200 18     Temp 01/22/21 1200 98.4 F (36.9 C)     Temp Source 01/22/21 1200 Oral     SpO2 01/22/21 1200 96 %     Weight 01/22/21 1052 104.3 kg (230 lb)     Height 01/22/21 1052 1.702 m ('5\' 7"'$ )     Head Circumference --      Peak Flow --      Pain Score 01/22/21 1051 3     Pain Loc --      Pain  Edu? --      Excl. in [place]? --  Constitutional: Alert and oriented. No acute distress. Pleasant and interactive Eyes: Conjunctivae are normal.  Head: Shallow laceration to the chin with bruising, approximately 1 cm, bleeding controlled Nose: No swelling or epistaxis Mouth/Throat: Mucous membranes are moist.   Neck:  Painless ROM, no pain with axial load, no vertebral has palpation Cardiovascular: Normal rate, regular rhythm. Grossly normal heart sounds.  Good peripheral circulation.  No chest wall tenderness palpation Respiratory: Normal respiratory effort.  No retractions. Lungs CTAB. Gastrointestinal: Soft and nontender. No distention.  No CVA tenderness.  Musculoskeletal: Bruising primarily to the right hand with skin tear, no bony normalities, full range of motion Neurologic:  Normal speech and language. No gross focal neurologic deficits are appreciated.  Cranial nerves II through XII normal, normal strength in all extremities Skin:  Skin is warm, dry Psychiatric: Mood and affect are normal. Speech and behavior are normal.  ____________________________________________   LABS (all labs ordered are listed, but only abnormal results are displayed)  Labs Reviewed  CBC - Abnormal; Notable for the following components:      Result Value   WBC 12.2 (*)    Hemoglobin 15.1 (*)    All other components within normal limits  PROTIME-INR - Abnormal; Notable for the following components:   Prothrombin Time 16.1 (*)    INR 1.3 (*)    All other components within normal limits  APTT  COMPREHENSIVE METABOLIC PANEL  TYPE AND SCREEN   ____________________________________________  EKG  None ____________________________________________  RADIOLOGY  Contacted by radiologist for subdural hematoma on CT X-rays negative for acute fracture ____________________________________________   PROCEDURES  Procedure(s) performed: yes  Procedures   Critical Care performed: yes  CRITICAL  CARE Performed by: Lavonia Drafts   Total critical care time: 30 minutes  Critical care time was exclusive of separately billable procedures and treating other patients.  Critical care was necessary to treat or prevent imminent or life-threatening deterioration.  Critical care was time spent personally by me on the following activities: development of treatment plan with patient and/or surrogate as well as nursing, discussions with consultants, evaluation of patient's response to treatment, examination of patient, obtaining history from patient or surrogate, ordering and performing treatments and interventions, ordering and review of laboratory studies, ordering and review of radiographic studies, pulse oximetry and re-evaluation of patient's condition.  ____________________________________________   INITIAL IMPRESSION / ASSESSMENT AND PLAN / ED COURSE  Pertinent labs & imaging results that were available during my care of the patient were reviewed by me and considered in my medical decision making (see chart for details).   Patient presents after mechanical fall as detailed above.  Swelling to the chin with laceration, amenable to skin glue  Notified by radiologist of small subdural hematoma, discussed with Dr. Cari Caraway of neurosurgery who does not recommend reversing Xarelto given last dose at 8 PM last night but does recommend repeat CT scan in 6 hours.  If unchanged appropriate for discharge with outpatient follow-up with neurosurgery, continue to hold Xarelto for 7 days  Have asked my colleague to follow-up on repeat CT scan    ____________________________________________   FINAL CLINICAL IMPRESSION(S) / ED DIAGNOSES  Final diagnoses:  Subdural hematoma (HCC)  Chin laceration, initial encounter  Contusion of right hand, initial encounter        Note:  This document was prepared using Dragon voice recognition software and may include unintentional dictation errors.     Lavonia Drafts, MD 01/22/21 [phone]

## 2021-01-22 NOTE — ED Triage Notes (Signed)
Pt comes into the ED via POV c/o mechanical fall this morning. Pt tripped on a portion of her carpet runner.  Pt hit her chin on the hardwood floors.  Denies any LOC, but does take blood thinners.  Pt also has swelling and bruising to the right hand.  Pt has abrasions and bruising to the chin as well.  Pt has small laceration on the bottom of the chin as well.  Pt in NAD and is currently neurologically intact.

## 2021-01-26 ENCOUNTER — Telehealth: Payer: Self-pay | Admitting: Cardiovascular Disease

## 2021-01-26 ENCOUNTER — Telehealth: Payer: Self-pay | Admitting: Cardiology

## 2021-01-26 NOTE — Telephone Encounter (Signed)
Pt called to ask what to take instead of xarelto, I explained why she should not take but she will call Dr. Donivan Scull office in AM to see how to get neurologist appt.

## 2021-01-26 NOTE — Telephone Encounter (Signed)
Attempted to reach out to Audrey Martinez, unable to make contact with pt, LMTCB, cannot LDM (no DPR on file).   Dr. Rockey Situ advised needs to have f/u with Audrey Martinez with neurology as as stated on her ED AVS paperwork. We cannot clear her to restart Xarelto as pt had a 98m subdural hematoma. Needs to be cleared by neuro before restarting medication blood thinner Xarelto. Need to make sure here hematoma has not progressed as Neuor will need to re-evaluate pt.

## 2021-01-26 NOTE — Telephone Encounter (Signed)
Patient calling  States she had a bad fall and was told to stop Xarelto for a week Would like to discuss if and when to start back Please call to discuss

## 2021-01-27 NOTE — Telephone Encounter (Signed)
Attempt x2 to reach pt, unable to make contact with number on file, LMTCB  Cannot LDM (no DPR on file)

## 2021-01-29 ENCOUNTER — Telehealth: Payer: Self-pay | Admitting: Cardiovascular Disease

## 2021-01-29 NOTE — Telephone Encounter (Signed)
Reviewed the patient's chart. She was seen and evaluated in the ER on 01/22/21 for after a mechanical fall (tripped over carpet runner) and hit her chin on the floor.   Patient on Xarelto at the time and a CT of the head was preformed.   Per ED note from Dr. Corky Downs on 01/22/21: Notified by radiologist of small subdural hematoma, discussed with Dr. Cari Caraway of neurosurgery who does not recommend reversing Xarelto given last dose at 8 PM last night but does recommend repeat CT scan in 6 hours.  If unchanged appropriate for discharge with outpatient follow-up with neurosurgery, continue to hold Xarelto for 7 days   Have asked my colleague to follow-up on repeat CT scan  Per ED note from Dr. Cherylann Banas:  6:07 PM on 01/22/2021 -----------------------------------------   I took over care of this patient from Dr. Corky Downs.  Repeat CT after 6 hours shows a stable subdural hematoma with no significant change from the earlier CT.  On reassessment the patient states that her headache is much better.  She is alert and oriented and feels well.  I discussed the case with Dr. Johnney Killian from neurosurgery who agreed with discharge as per the original plan.  The patient is stable for discharge home at this time.  I counseled her on the results of the work-up and the follow-up plan.  She was advised to stop the Xarelto for the next week.  Return precautions given, and she expressed understanding.    Per Dr. Donivan Scull last office note from 09/11/20 : covid 62 pneumonia, Hospitalized Sept 2021 Bilateral PE, on xarelto,  She is nervous about coming off anticoagulation Will recommend she come off the Xarelto If she is nervous,  could do a preventive dose of 10 mg daily  Personal history of transient ischemic attack (TIA), and cerebral infarction without residual deficits Does not want aspirin, Plavix  Currently on Xarelto for PE No documentation of atrial fibrillation or flutter   To Dr. Rockey Situ to review regarding  patient's concerns for resuming Xarelto. I am not sure that she needs this currently due to PE 1 year ago, but she does have a stroke history and declined ASA/ Plavix.

## 2021-01-29 NOTE — Telephone Encounter (Signed)
Secure chat message received from Dr. Rockey Situ in response to the message below:  Dr. Rockey Situ: I think I sent a message to [facility] inbox day or 2 ago. My last clinic visit told her she could stop Xarelto as she did not need it she had done 6 months for PE. She was hesitant to stop it that was her decision. She can stop Xarelto permanently  I inquired what she should do in regards to her stroke/ TIA history.  Secure chat message from Dr. Rockey Situ: she is welcome to do eliquis 2.5 BID once neuro clears her sorry xarelto 10 daily    I have called and spoken with the patient. She is aware of Dr. Donivan Scull recommendations as above. She advised her stroke was in the 69's and she was not on any anticoagulation from that time until her PE a year ago.  I have advised her she can stop Xarelto per Dr. Rockey Situ. The patient advised "I was going to take myself off anyway." She was very appreciative of the call back.

## 2021-01-29 NOTE — Telephone Encounter (Signed)
Patient states she is not comfortable re starting her Xarelto. She does not like how she bruises when she is taking this medication. Please call to discuss.

## 2021-01-29 NOTE — Telephone Encounter (Signed)
Was able to reach out again to Audrey Martinez, advised heat CT would need to be by neuro or her PCP. Here at cardiologist we do not follow-up head trauma/subdural hematoma. Pt verbalized understanding, will call neuro back or have her PCP to order the head CT.

## 2021-01-29 NOTE — Telephone Encounter (Addendum)
Was able to make contact with Mrs. Mancel Bale via phone, advised that Dr. Rockey Situ recommends to hold off on Xarelto until cleared by Neurologist d/t recent subdural hematoma. Advised Dr. Belenda Cruise with neuro was recommended by the ED during her visit, his number and office is on the ED AVS. Mrs. Irigoyen reports understanding, stated she "did not read those papers, I did not know, thanks for telling me".   Mrs. Villafana also reports has a f/u app with her PCP following her ED visit. Will also speak with PCP regarding clearance and will call neuro for an appt. She understands to not restart Xarelto until further instructed.   Mrs. Tolleson thankful for the return call, otherwise all questions or concerns were address and no additional concerns at this time. Agreeable to plan, will call back for anything further.

## 2021-01-29 NOTE — Telephone Encounter (Signed)
Patient calling  States we wanted her to have a head CT Would need order placed to get scheduled Please call when order placed

## 2021-02-08 ENCOUNTER — Other Ambulatory Visit: Payer: Self-pay

## 2021-02-08 ENCOUNTER — Ambulatory Visit (INDEPENDENT_AMBULATORY_CARE_PROVIDER_SITE_OTHER): Payer: Medicare Other | Admitting: Dermatology

## 2021-02-08 DIAGNOSIS — L578 Other skin changes due to chronic exposure to nonionizing radiation: Secondary | ICD-10-CM

## 2021-02-08 DIAGNOSIS — Z1283 Encounter for screening for malignant neoplasm of skin: Secondary | ICD-10-CM

## 2021-02-08 DIAGNOSIS — Z85828 Personal history of other malignant neoplasm of skin: Secondary | ICD-10-CM

## 2021-02-08 DIAGNOSIS — D1800 Hemangioma unspecified site: Secondary | ICD-10-CM

## 2021-02-08 DIAGNOSIS — D692 Other nonthrombocytopenic purpura: Secondary | ICD-10-CM | POA: Diagnosis not present

## 2021-02-08 DIAGNOSIS — Z86006 Personal history of melanoma in-situ: Secondary | ICD-10-CM | POA: Diagnosis not present

## 2021-02-08 DIAGNOSIS — L821 Other seborrheic keratosis: Secondary | ICD-10-CM | POA: Diagnosis not present

## 2021-02-08 DIAGNOSIS — L814 Other melanin hyperpigmentation: Secondary | ICD-10-CM

## 2021-02-08 DIAGNOSIS — L720 Epidermal cyst: Secondary | ICD-10-CM | POA: Diagnosis not present

## 2021-02-08 DIAGNOSIS — W19XXXA Unspecified fall, initial encounter: Secondary | ICD-10-CM

## 2021-02-08 DIAGNOSIS — T148XXA Other injury of unspecified body region, initial encounter: Secondary | ICD-10-CM

## 2021-02-08 DIAGNOSIS — S0083XA Contusion of other part of head, initial encounter: Secondary | ICD-10-CM

## 2021-02-08 NOTE — Patient Instructions (Signed)

## 2021-02-08 NOTE — Progress Notes (Signed)
Follow-Up Visit   Subjective  Audrey Martinez is a 75 y.o. female who presents for the following: Follow-up (Patient presents for 6 month follow-up and TBSE. No areas of concern today. She has a history of melanoma in situ of the right posterior neck, excised 08/10/2020 and a history of BCC of the central forehead (2018).).  Patient fell 1-2 weeks ago and landed on her chin on the floor. She now feels a bump that moves around in this area.   The following portions of the chart were reviewed this encounter and updated as appropriate:       Review of Systems:  No other skin or systemic complaints except as noted in HPI or Assessment and Plan.  Objective  Well appearing patient in no apparent distress; mood and affect are within normal limits.  A full examination was performed including scalp, head, eyes, ears, nose, lips, neck, chest, axillae, abdomen, back, buttocks, bilateral upper extremities, bilateral lower extremities, hands, feet, fingers, toes, fingernails, and toenails. All findings within normal limits unless otherwise noted below.  R posterior neck Well healed scar with no evidence of recurrence.   Inferior Chin Indistinct firm sq nodule, ~1.0cm, nontender  L lower neck 5.1m firm white nodule   Assessment & Plan  Skin cancer screening performed today.  Actinic Damage - chronic, secondary to cumulative UV radiation exposure/sun exposure over time - diffuse scaly erythematous macules with underlying dyspigmentation - Recommend daily broad spectrum sunscreen SPF 30+ to sun-exposed areas, reapply every 2 hours as needed.  - Recommend staying in the shade or wearing long sleeves, sun glasses (UVA+UVB protection) and wide brim hats (4-inch brim around the entire circumference of the hat). - Call for new or changing lesions.  History of Basal Cell Carcinoma of the Skin - No evidence of recurrence today of the central forehead - Recommend regular full body skin  exams - Recommend daily broad spectrum sunscreen SPF 30+ to sun-exposed areas, reapply every 2 hours as needed.  - Call if any new or changing lesions are noted between office visits  History of melanoma in situ R posterior neck  Clear. Observe for recurrence. Call clinic for new or changing lesions.  Recommend regular skin exams, daily broad-spectrum spf 30+ sunscreen use, and photoprotection.    Hematoma Inferior Chin  Secondary to trauma from fall   Will self-resolve over time. Observation. RTC if changes or symptoms.  Epidermal inclusion cyst L lower neck  Benign-appearing. Exam most consistent with an epidermal inclusion cyst. Discussed that a cyst is a benign growth that can grow over time and sometimes get irritated or inflamed. Recommend observation if it is not bothersome. Discussed option of surgical excision to remove it if it is growing, symptomatic, or other changes noted. Please call for new or changing lesions so they can be evaluated.    Lentigines - Scattered tan macules - Due to sun exposure - Benign-appering, observe - Recommend daily broad spectrum sunscreen SPF 30+ to sun-exposed areas, reapply every 2 hours as needed. - Call for any changes  Purpura - Chronic; persistent and recurrent.  Treatable, but not curable. - Violaceous macules and patches - Benign - Related to trauma, age, sun damage and/or use of blood thinners, chronic use of topical and/or oral steroids - Observe - Can use OTC arnica containing moisturizer such as Dermend Bruise Formula if desired - Call for worsening or other concerns  Hemangiomas - Red papules - Discussed benign nature - Observe - Call for any  changes  Seborrheic Keratoses - Stuck-on, waxy, tan-brown papules and/or plaques  - Benign-appearing - Discussed benign etiology and prognosis. - Observe - Call for any changes  Return in about 6 months (around 08/08/2021) for TBSE, hx melanoma in situ.  IJamesetta Orleans,  CMA, am acting as scribe for Brendolyn Patty, MD .  Documentation: I have reviewed the above documentation for accuracy and completeness, and I agree with the above.  Brendolyn Patty MD

## 2021-02-11 DIAGNOSIS — R1314 Dysphagia, pharyngoesophageal phase: Secondary | ICD-10-CM | POA: Diagnosis not present

## 2021-02-11 DIAGNOSIS — R490 Dysphonia: Secondary | ICD-10-CM | POA: Diagnosis not present

## 2021-02-17 NOTE — Progress Notes (Signed)
Cardiology Office Note  Date:  02/19/2021   ID:  Audrey, Martinez 1945-10-02, MRN [id number]  PCP:  Audrey Hess, MD   Chief Complaint  Patient presents with   Follow-up    Patient doing well. No problems noted.      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  Non-smoker  orthostasis Previous migraines Nonischemic cardiomyopathy ejection fraction 30 to 35% November 2020 Catheterization with nonobstructive coronary disease Ejection fraction remaining 30 to 35% in August 2021  COVID infection  in sept 2021 Bilateral pulmonary emboli, without right heart strain. Who presents for f/u of her atrial tachycardia, SVT, EF 30 to 35%, possible hypertensive cardiomyopathy, medication intolerances  Last seen in clinic by myself April 2022  Walks dogs  Recent fall, mechanical , last month Hit chin, significant trauma, concussion In the ER did CT scan, right 93mm tentorial SDH after a mechanical fall at home.  Seen by neurology  Unsteady gait, uses a cane Has a glider for exercise  Denies any recent tachypalpitations No chest pain concerning for angina Denies any leg swelling, no orthostasis, PND symptoms  Lives alone, son helps Does her ADLs  EKG personally reviewed by myself on todays visit NSR rate 75 bpm poor R wave progression to the anterior precordial leads, left axis deviation, no change compared to prior EKGs  Other past medical history reviewed echo August 2021 ejection fraction 30 to 35%, same as 2020, normal right heart pressures  Echocardiogram April 17, 2019  with ejection fraction 30 to 35%, normal RV function  Cardiac catheterization performed for cardiomyopathy  Catheterization December 2020 Nonobstructive CAD, EF estimated at 30 to 35%, global  hypokinesis,  LVEDP 17 to 20 mm Hg  episode of tachycardia, went to the ER 06/03/2018 Rhythm broke before she could get an EKG in the emergency room Rare episodes, does not remember the last time she had an episode  Right eye vision went dark, 01/02/2019 seen in the ER MRI in the ER, no CVA CT head Encephalomalacia changes in the left frontal region. Left eye: darkness "came down a bit"  zio monitor,  Normal sinus rhythm  avg HR of 89 bpm. 3 Ventricular Tachycardia runs occurred, the run with the fastest interval lasting 5 beats with a max rate of 200 bpm, the longest lasting 7 beats with an avg rate of 141 bpm.  Patient triggered events were not associated with significant arrhythmia.  Carotid: Minor carotid atherosclerosis. No hemodynamically significant ICA stenosis. Degree of narrowing less than 50% bilaterally by ultrasound criteria.   Echocardiogram May 18, 2018 Ejection fraction 45 to 50% mild diffuse hypokinesis unable to exclude regional wall motion abnormalities Concerning for anteroseptal and apical hypokinesis   Lab Results  Component Value Date   CHOL 113 01/30/2020   HDL 40 (L) 01/30/2020   LDLCALC 68 01/30/2020   TRIG 27 01/30/2020     Notes from [place] cardiologist Notes indicate 1. Dual AV nodal physiology. 2. Typical slow fast AV nodal reentrant tachycardia. 3. Nonsustained AT / No inducible atrial flutter  She underwent s/p Successful radiofrequency ablation of AV nodal slow pathway.   She had recurrent symptoms shortly after, had event monitor  repeat EPS 01/21/08:  1. Markedly prolonged baseline AH interval (AV nodal function is subnormal)  2. No induceable tachycardia;  Only induceable double echo beats As per EP,  event monitor with ectopic atrial tachycardia versus atrial flutter with 2:1 block.   Notes indicating she did not want anticoagulation  PRIOR CARDIAC STUDIES: -ECHOCARDIOGRAM 12/13/06: LVEF 55-60%. No significant valvular  abnormalities. Noted to have episodes of atrial flutter at controlled rate. -ETT 09/28/07: Bruce protocol 5:01 minutes. 7 METS. Maximal HR 179 bpm (111% MPHR). Pretest ECG normal with HR 96 bpm. No chest pain. No significant ischemia.  -STRESS ECHO 11/14/08: Bruce protocol 4:31 minutes. 4.6 METS. 113% MPHR. LVEF 60%. No significant ischemia.  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 -Hypertension -Hyperlipidemia -History of TIA -Asthma -History of migraines -Right cataract and history of right retinal bleeding over a year ago, now resolved -S/P umbilical hernia repair -S/P tonsillectomy -S/P multiple wrist surgeries -S/P excision of right supraclavicular neck mass (lymph node) -S/P cataract surgery   PMH:   has a past medical history of Actinic keratosis, Acute pulmonary embolus ([place]) (01/29/2020), Acute renal failure (ARF) ([place]) (01/31/2020), Acute respiratory failure with hypoxia ([place]) (01/29/2020), Acute septic pulmonary embolism ([place]), AKI (acute kidney injury) ([place]), Amaurosis fugax, right eye (12/2018), Arthritis, Asthma, Basal cell carcinoma (07/2016), Cardiomyopathy ([place]), GERD (gastroesophageal reflux disease), Herniated disc, cervical, Hyperlipidemia, Left wrist fracture, Melanoma ([place]) (05/12/2020), Osteoporosis, Pneumonia due to COVID-19 virus (01/26/2020), Stroke ([place]) (1998), and SVT (supraventricular tachycardia) ([place]).  PSH:    Past Surgical History:  Procedure Laterality Date   BACK SURGERY  2017   L2-4 laminectomy and foraminal stenosis   BREAST CYST ASPIRATION Left 1977   CARDIAC CATHETERIZATION  2009   [facility] [place]  2009   CATARACT EXTRACTION, BILATERAL  2010   COLONOSCOPY WITH PROPOFOL N/A 03/21/2018   Procedure: COLONOSCOPY WITH Biopsy;  Surgeon: Lin Landsman, MD;  Location: [facility];  Service: Endoscopy;  Laterality: N/A;   derrick procedure  1977-78   bilateral     ESOPHAGOGASTRODUODENOSCOPY  2013   gastritis; done for complaint of dysphagia   ESOPHAGOGASTRODUODENOSCOPY (EGD) WITH PROPOFOL N/A 03/21/2018   Procedure: ESOPHAGOGASTRODUODENOSCOPY (EGD) WITH Biopsies;  Surgeon: Lin Landsman, MD;  Location: [facility];  Service: Endoscopy;  Laterality: N/A;  KEEP THIS PATIENT FIRST   LEFT HEART CATH AND CORONARY ANGIOGRAPHY Left 05/02/2019   Procedure: LEFT HEART CATH AND CORONARY ANGIOGRAPHY;  Surgeon: Minna Merritts, MD;  Location: [facility];  Service: Cardiovascular;  Laterality: Left;   lymph node removal     neck   MOHS SURGERY  07/2016   BCCA nose   POLYPECTOMY N/A 03/21/2018   Procedure: POLYPECTOMY;  Surgeon: Lin Landsman, MD;  Location: [facility];  Service: Endoscopy;  Laterality: N/A;   REPLACEMENT TOTAL KNEE  2013   RT   TOTAL SHOULDER REPLACEMENT  2012   RT     Current Outpatient Medications  Medication Sig Dispense Refill   albuterol (PROVENTIL HFA;VENTOLIN HFA) 108 (90 Base) MCG/ACT inhaler Inhale 1-2 puffs into the lungs every 6 (six) hours as needed for wheezing or shortness of breath.      B Complex-C (B-COMPLEX WITH VITAMIN C) tablet Take 1 tablet by mouth daily.      baclofen (LIORESAL) 10 MG tablet TAKE 1 TABLET BY MOUTH THREE TIMES A DAY 270 tablet 0   Bioflavonoid Products (ESTER-C) TABS Take 1 tablet by mouth 2 (two) times daily.      calcium carbonate (OS-CAL - DOSED IN MG OF ELEMENTAL CALCIUM)  1250 (500 Ca) MG tablet Take 1 tablet by mouth.     carvedilol (COREG) 6.25 MG tablet Take 1 tablet (6.25 mg total) by mouth 2 (two) times daily with a meal. (Patient taking differently: Take 3.125 mg by mouth 2 (two) times daily with a meal.) 180 tablet 0   cholecalciferol (VITAMIN D) 25 MCG (1000 UT) tablet Take 1,000 Units by mouth daily.     Cranberry 1000 MG CAPS Take 1,000 mg by mouth daily.      ezetimibe (ZETIA) 10 MG tablet TAKE 1 TABLET BY MOUTH EVERY DAY 90 tablet 2   fluticasone  (FLOVENT HFA) 110 MCG/ACT inhaler Inhale 2 puffs into the lungs 2 (two) times daily. 12 g 0   gabapentin (NEURONTIN) 300 MG capsule TAKE 1 CAPSULE BY MOUTH 3 TIMES DAILY. AND TAKE 3 CAPSULES DAILY AT BEDTIME. 540 capsule 1   Ginger, Zingiber officinalis, (GINGER PO) Take 1,100 mg by mouth daily.      losartan (COZAAR) 25 MG tablet TAKE 1/2 TABLET BY MOUTH EVERY DAY 45 tablet 3   MAG ASPART-POTASSIUM ASPART PO Take 1 tablet by mouth 2 (two) times daily. 600 mg-198 mg     Misc Natural Products (TART CHERRY ADVANCED PO) Take 1 capsule by mouth daily.      montelukast (SINGULAIR) 10 MG tablet Take 10 mg by mouth every evening.     Multiple Vitamins-Minerals (MULTIVITAMIN WITH MINERALS) tablet Take 1 tablet by mouth daily. Centrum Silver     mupirocin ointment (BACTROBAN) 2 % Apply 1 application topically daily. Qd to excision site with dressing changes 22 g 0   predniSONE (DELTASONE) 10 MG tablet Take 40 mg on day 1 and decrease by 10mg  each day -- 40, then 30, then 20, then 10 for a total of 4 days. 10 tablet 0   Probiotic Product (PROBIOTIC DAILY PO) Take 1 capsule by mouth 2 (two) times a week.      STRIVERDI RESPIMAT 2.5 MCG/ACT AERS INHALE 2 PUFFS INTO THE LUNGS DAILY. USE AT THE SAME TIME EVERYDAY 12 g 2   Turmeric 500 MG CAPS Take 500 mg by mouth daily.      zinc sulfate 220 (50 Zn) MG capsule Take 1 capsule (220 mg total) by mouth daily. 30 capsule 0   famotidine (PEPCID) 20 MG tablet Take 1 tablet (20 mg total) by mouth 2 (two) times daily for 10 days. 20 tablet 0   No current facility-administered medications for this visit.     Allergies:   Amoxicillin, Aspirin, Ciprofloxacin, Ciprofloxacin-dexamethasone, Crestor [rosuvastatin], Morphine and related, Doxycycline hyclate, Hydrochlorothiazide, Keflex [cephalexin], Lovastatin, Simvastatin, Erythromycin, Ibuprofen, Neomycin-bacitracin zn-polymyx, Omeprazole, and Tape   Social History:  The patient  reports that she has never smoked. She has  never used smokeless tobacco. She reports that she does not currently use alcohol. She reports that she does not use drugs.   Family History:   family history includes CAD (age of onset: 84) in her sister; Clotting disorder in her half-sister and half-sister; Diabetes in her mother; Stroke in her mother and sister; Stroke (age of onset: 86) in her sister; Valvular heart disease in her father.    Review of Systems: Review of Systems  Constitutional: Negative.   HENT: Negative.    Respiratory: Negative.    Cardiovascular: Negative.   Gastrointestinal: Negative.   Musculoskeletal: Negative.   Psychiatric/Behavioral: Negative.    All other systems reviewed and are negative.  PHYSICAL EXAM: VS:  BP 130/74   Pulse 75  Ht 5\' 6"  (1.676 m)   Wt 243 lb 6.4 oz (110.4 kg)   SpO2 97%   BMI 39.29 kg/m  , BMI Body mass index is 39.29 kg/m. Constitutional:  oriented to person, place, and time. No distress.  HENT:  Head: Grossly normal Eyes:  no discharge. No scleral icterus.  Neck: No JVD, no carotid bruits  Cardiovascular: Regular rate and rhythm, no murmurs appreciated Pulmonary/Chest: Clear to auscultation bilaterally, no wheezes or rails Abdominal: Soft.  no distension.  no tenderness.  Musculoskeletal: Normal range of motion Neurological:  normal muscle tone. Coordination normal. No atrophy Skin: Skin warm and dry Psychiatric: normal affect, pleasant  Recent Labs: 01/22/2021: ALT 20; BUN 20; Creatinine, Ser 1.16; Hemoglobin 15.1; Platelets 236; Potassium 4.2; Sodium 140    Lipid Panel Lab Results  Component Value Date   CHOL 113 01/30/2020   HDL 40 (L) 01/30/2020   LDLCALC 68 01/30/2020   TRIG 27 01/30/2020      Wt Readings from Last 3 Encounters:  02/19/21 243 lb 6.4 oz (110.4 kg)  01/22/21 230 lb (104.3 kg)  01/20/21 230 lb (104.3 kg)     ASSESSMENT AND PLAN:  covid 19 pneumonia, Hospitalized Sept 2021 Bilateral PE, on xarelto,  Xarelto PRN for long car trips, 10  mg  SVT (supraventricular tachycardia) (HCC) -  Prior ablation  Continue Coreg 3.125 twice daily,  No significant arrhythmia  Cardiomyopathy, dilated Nonischemic Ejection fraction 35% Continue Coreg, will change losartan to Entresto 24/26 mg twice daily Consider addition of spironolactone 25 mg daily in future, Jardiance 10 mg daily  Atrial tachycardia (HCC) -  Prior monitor showing short run VT and PVCs Previous episode likely atrial tachycardia resolved before she got to the emergency room June 04, 2019 Stable symptoms, continue Coreg  Mixed hyperlipidemia - She does not want a statin  Zetia may have caused her eye problems  Personal history of transient ischemic attack (TIA), and cerebral infarction without residual deficits Does not want aspirin, Plavix  No documentation of atrial fibrillation or flutter Prefers to take Xarelto as needed not daily  Reactive airway disease/asthma On inhalers, Recovered from COVID Stable   Total encounter time more than 25 minutes  Greater than 50% was spent in counseling and coordination of care with the patient   No orders of the defined types were placed in this encounter.    Signed, Esmond Plants, M.D., Ph.D. 02/19/2021  [place], [place]

## 2021-02-18 ENCOUNTER — Other Ambulatory Visit: Payer: Self-pay | Admitting: Otolaryngology

## 2021-02-18 DIAGNOSIS — R1310 Dysphagia, unspecified: Secondary | ICD-10-CM

## 2021-02-18 DIAGNOSIS — K219 Gastro-esophageal reflux disease without esophagitis: Secondary | ICD-10-CM

## 2021-02-18 DIAGNOSIS — S065X9A Traumatic subdural hemorrhage with loss of consciousness of unspecified duration, initial encounter: Secondary | ICD-10-CM | POA: Diagnosis not present

## 2021-02-19 ENCOUNTER — Other Ambulatory Visit: Payer: Self-pay

## 2021-02-19 ENCOUNTER — Ambulatory Visit (INDEPENDENT_AMBULATORY_CARE_PROVIDER_SITE_OTHER): Payer: Medicare Other | Admitting: Cardiovascular Disease

## 2021-02-19 ENCOUNTER — Encounter: Payer: Self-pay | Admitting: Cardiovascular Disease

## 2021-02-19 VITALS — BP 130/74 | HR 75 | Ht 66.0 in | Wt 243.4 lb

## 2021-02-19 DIAGNOSIS — I472 Ventricular tachycardia: Secondary | ICD-10-CM

## 2021-02-19 DIAGNOSIS — I10 Essential (primary) hypertension: Secondary | ICD-10-CM

## 2021-02-19 DIAGNOSIS — I5020 Unspecified systolic (congestive) heart failure: Secondary | ICD-10-CM | POA: Diagnosis not present

## 2021-02-19 DIAGNOSIS — I2699 Other pulmonary embolism without acute cor pulmonale: Secondary | ICD-10-CM

## 2021-02-19 DIAGNOSIS — I471 Supraventricular tachycardia, unspecified: Secondary | ICD-10-CM

## 2021-02-19 DIAGNOSIS — I4729 Other ventricular tachycardia: Secondary | ICD-10-CM

## 2021-02-19 DIAGNOSIS — Z79899 Other long term (current) drug therapy: Secondary | ICD-10-CM | POA: Diagnosis not present

## 2021-02-19 DIAGNOSIS — I420 Dilated cardiomyopathy: Secondary | ICD-10-CM | POA: Diagnosis not present

## 2021-02-19 DIAGNOSIS — E782 Mixed hyperlipidemia: Secondary | ICD-10-CM | POA: Diagnosis not present

## 2021-02-19 DIAGNOSIS — I4719 Other supraventricular tachycardia: Secondary | ICD-10-CM

## 2021-02-19 MED ORDER — RIVAROXABAN 20 MG PO TABS: 20.0000 mg | ORAL_TABLET | ORAL | 3 refills | Status: DC | PRN

## 2021-02-19 MED ORDER — ENTRESTO 24-26 MG PO TABS: 1.0000 | ORAL_TABLET | Freq: Two times a day (BID) | ORAL | 3 refills | Status: DC

## 2021-02-19 NOTE — Patient Instructions (Addendum)
Medication Instructions:   Xarelto 10 mg PRN for long car or plane ride  Please STOP Losartan  Please START Entresto 24/26 mg twice a day Free samples (1 bottle) Lot: [id number] Exp: 11/2022 Patient assistance application given [id number] free trail offer given If you need a refill on your cardiac medications before your next appointment, please call your pharmacy.    Lab work: BMP in 3 weeks  Testing/Procedures: No new testing needed  Follow-Up: At [facility], you and your health needs are our priority.  As part of our continuing mission to provide you with exceptional heart care, we have created designated Provider Care Teams.  These Care Teams include your primary Cardiologist (physician) and Advanced Practice Providers (APPs -  Physician Assistants and Nurse Practitioners) who all work together to provide you with the care you need, when you need it.  You will need a follow up appointment in 12 months  Providers on your designated Care Team:   Murray Hodgkins, NP Christell Faith, PA-C Marrianne Mood, PA-C Cadence Cheshire, [place]  COVID-19 Vaccine Information can be found at: ShippingScam.co.uk For questions related to vaccine distribution or appointments, please email [email or web address] or call [phone].   Please monitor blood pressures and keep a log of your readings.  Call the clinic in 2-3 weeks with BP readings. Or upload to MyChart for review  How to use a home blood pressure monitor. Be still. Measure at the same time every day. It's important to take the readings at the same time each day, such as morning and evening. Take reading approximately 1 1/2 to 2 hours after BP medications.   AVOID these things for 30 minutes before checking your blood pressure: Drinking caffeine. Drinking alcohol. Eating. Smoking. Exercising.   Five minutes before checking your blood pressure: Pee. Sit in a dining chair.  Avoid sitting in a soft couch or armchair. Be quiet. Do not talk.       Sit correctly. Sit with your back straight and supported (on a dining chair, rather than a sofa). Your feet should be flat on the floor and your legs should not be crossed. Your arm should be supported on a flat surface (such as a table) with the upper arm at heart level. Make sure the bottom of the cuff is placed directly above the bend of the elbow.

## 2021-02-23 NOTE — Addendum Note (Signed)
Addended by: Britt Bottom on: 02/23/2021 10:09 AM   Modules accepted: Orders

## 2021-03-09 ENCOUNTER — Ambulatory Visit
Admission: RE | Admit: 2021-03-09 | Discharge: 2021-03-09 | Disposition: A | Discharge status: Home / Self Care | Payer: Medicare Other | Source: Ambulatory Visit | Attending: Otolaryngology | Admitting: Otolaryngology

## 2021-03-09 ENCOUNTER — Other Ambulatory Visit: Payer: Self-pay

## 2021-03-09 DIAGNOSIS — R1310 Dysphagia, unspecified: Secondary | ICD-10-CM | POA: Diagnosis not present

## 2021-03-09 DIAGNOSIS — K219 Gastro-esophageal reflux disease without esophagitis: Secondary | ICD-10-CM | POA: Insufficient documentation

## 2021-03-09 NOTE — Therapy (Addendum)
[place], [place], [address] Phone: [phone]   Fax:     Modified Barium Swallow  Patient Details  Name: Audrey Martinez MRN: [id number] Date of Birth: 02-01-1946 No data recorded  Encounter Date: 03/09/2021   End of Session - 03/09/21 1734     Visit Number 1    Number of Visits 1    Date for SLP Re-Evaluation 03/09/21    SLP Start Time 1305    SLP Stop Time  7096    SLP Time Calculation (min) 60 min    Activity Tolerance Patient tolerated treatment well             Past Medical History:  Diagnosis Date   Actinic keratosis    Acute pulmonary embolus ([place]) 01/29/2020   Acute renal failure (ARF) ([place]) 01/31/2020   Acute respiratory failure with hypoxia ([place]) 01/29/2020   Acute septic pulmonary embolism ([facility])    AKI (acute kidney injury) ([place])    Amaurosis fugax, right eye 12/2018   Arthritis    Asthma    Basal cell carcinoma 07/2016   central forehead (tx in [place])   Cardiomyopathy [facility])    a. 04/2018 Echo: EF 45-50%. Anteroseptal and apical HK in some views. Mild AI/MR. Nl RV fxn; b. 05/2018 MV: mid-dist ant and antsept mild defect - ? breast atten. EF 41%. No ischemia; c. 03/2019 Echo: EF 30-35%, Gr2 DD, Trace MR, triv TR. Asc Ao ectatic dil - 45mm.    GERD (gastroesophageal reflux disease)    Herniated disc, cervical    Hyperlipidemia    Left wrist fracture    Melanoma ([place]) 05/12/2020   Melanoma IS R post neck, excised 08/10/20   Osteoporosis    Pneumonia due to COVID-19 virus 01/26/2020   Stroke ([place]) 1998   SVT (supraventricular tachycardia) ([place])    a.2008 s/p RFCA for AVNRT; b. 9 & 02/2019 Zio x 2: 1. Avg HR 89, 3 runs NSVT (longest 7 beats), Mobitz 1. 2. Avg HR 89, NSVT x 1 (5 beats), Mobitz 1. No signif arrhythmia.    Past Surgical History:  Procedure Laterality Date   BACK SURGERY  2017   L2-4 laminectomy and foraminal stenosis   BREAST CYST ASPIRATION Left 1977    CARDIAC CATHETERIZATION  2009   [facility] [place]  2009   CATARACT EXTRACTION, BILATERAL  2010   COLONOSCOPY WITH PROPOFOL N/A 03/21/2018   Procedure: COLONOSCOPY WITH Biopsy;  Surgeon: Lin Landsman, MD;  Location: [place];  Service: Endoscopy;  Laterality: N/A;   derrick procedure  1977-78   bilateral    ESOPHAGOGASTRODUODENOSCOPY  2013   gastritis; done for complaint of dysphagia   ESOPHAGOGASTRODUODENOSCOPY (EGD) WITH PROPOFOL N/A 03/21/2018   Procedure: ESOPHAGOGASTRODUODENOSCOPY (EGD) WITH Biopsies;  Surgeon: Lin Landsman, MD;  Location: [place];  Service: Endoscopy;  Laterality: N/A;  KEEP THIS PATIENT FIRST   LEFT HEART CATH AND CORONARY ANGIOGRAPHY Left 05/02/2019   Procedure: LEFT HEART CATH AND CORONARY ANGIOGRAPHY;  Surgeon: Minna Merritts, MD;  Location: [place] [facility];  Service: Cardiovascular;  Laterality: Left;   lymph node removal     neck   MOHS SURGERY  07/2016   BCCA nose   POLYPECTOMY N/A 03/21/2018   Procedure: POLYPECTOMY;  Surgeon: Lin Landsman, MD;  Location: [place];  Service: Endoscopy;  Laterality: N/A;   REPLACEMENT TOTAL KNEE  2013   RT   TOTAL SHOULDER REPLACEMENT  2012   RT     There were no vitals filed for this visit.    Subjective: Patient behavior: (alertness, ability to follow instructions, etc.): A/O x4. Verbal w/ this Clinician; noted gravely vocal quality.  Chief complaint: dysphagia - she c/o of a "stuck" feeling pointing to the right side of neck.  Per chart notes, pt was seen by Outpatient ST services in 2020 for Voice Therapy -- ENT notes indicated: "Abnormal laryngeal findings include erythema, cobblestoning, edema, and bilateral muscle tension dysphonia with touching of the false vocal folds with phonation.". Such a presentation can be seen in pts w/ REFLUX. Per EDG in 2019, "Esophageal mucosal  changes suspicious for eosinophilic esophagitis", and a PPI was recommended then. Pt stated she is NO LONGER taking this PPI but is intermittently taking "Gaviscon when needed". She continues to report reduced vocal quality. Pt c/o DRY MOUTH. Pt also has Baseline h/o COPD and asthma; CVA in 1998, TIA.  She states No weight loss or change in diet; no restrictions d/t swallowing. She reports NO episodes of pneumonia nor tx for such.   OM Exam: WFL; native dentition. Cough+. Min. Dry Mouth(baseline per pt)    Objective:  Radiological Procedure: A videoflouroscopic evaluation of oral-preparatory, reflex initiation, and pharyngeal phases of the swallow was performed; as well as a screening of the upper esophageal phase.  POSTURE: upright VIEW: lateral COMPENSATORY STRATEGIES: none BOLUSES ADMINISTERED:  Thin Liquid: 6 including multiple sips  Nectar-thick Liquid: 3 including multiple sips  Honey-thick Liquid: NT  Puree: 2 trials  Mechanical Soft: 1 trial RESULTS OF EVALUATION: ORAL PREPARATORY PHASE: (The lips, tongue, and velum are observed for strength and coordination)       **Overall Severity Rating: grossly WFL. Intermittent premature spillage of liquids into the pharynx before the swallow noted. Any slight residue was cleared w/ nonvolitional, dry swallow in between -- this could be attributed to pt's chronic dry mouth. Timely mastication and A-P transfer; oral clearing achieved w/ all consistencies.   SWALLOW INITIATION/REFLEX: (The reflex is normal if "triggered" by the time the bolus reached the base of the tongue)  **Overall Severity Rating: grossly WFL. Most liquid trials triggered the pharyngeal swallow at the level of the Valleculae w/ slight spilling from the Valleculae to the Pyriform Sinuses. Suspect this delay could be attributed to pt's Baseline REFLUX and its impact on the pharynx(tissue of). Noted ENT note in 2020 of direct view of the laryngopharynx. NO laryngeal penetration  nor aspiration was noted w/ any trial consistency given.    PHARYNGEAL PHASE: (Pharyngeal function is normal if the bolus shows rapid, smooth, and continuous transit through the pharynx and there is no pharyngeal residue after the swallow)  **Overall Severity Rating: [facility]. No pharyngeal residue remained indicating adequate pharyngeal peristalsis and laryngeal excursion w/ anterior movement.   LARYNGEAL PENETRATION: (Material entering into the laryngeal inlet/vestibule but not aspirated): NONE ASPIRATION: NONE ESOPHAGEAL PHASE: (Screening of the upper esophagus): limited (lateral) viewing of the Cervical Esophagus, however, slight/transient bolus stasis noted w/ increased viscosity. Recommend formal assessment by GI.    ASSESSMENT: Pt appears to present w/ adequate oropharyngeal phase swallow function and airway protection w/ No gross oropharyngeal phase dysphagia; NO laryngeal penetration nor aspiration noted on this study. Pt appeared to demonstrate potential slow clearing of the Esophagus w/ increased bolus viscosity.  Pt has an Esophageal phase history per EGD in 2019 including Esophagitis; GERD. Pt was seen for Outpatient  ST services in 2020 for Voice Therapy, and ENT notes indicated: "Abnormal laryngeal findings include erythema, cobblestoning, edema, and bilateral muscle tension dysphonia with touching of the false vocal folds with phonation.". Such a presentation can be seen in pts w/ REFLUX. A PPI was recommended in 2019. Pt stated she is NO LONGER taking this PPI but is intermittently taking "Gaviscon when needed". She continues to report reduced vocal quality. Pt c/o DRY MOUTH. During the pharyngeal phase, most liquid trials triggered the pharyngeal swallow at the level of the Valleculae w/ slight spilling from the Valleculae to the Pyriform Sinuses. Suspect this delay could be attributed to pt's Baseline REFLUX and its impact on the pharynx(tissue of). NO laryngeal penetration nor aspiration  was noted w/ any trial consistency given. No pharyngeal residue remained indicating adequate pharyngeal peristalsis and laryngeal excursion w/ anterior movement.  During the oral phase, intermittent premature spillage of liquids into the pharynx before the swallow was noted; this seemed impacted by the bolus size. Any slight residue was cleared w/ nonvolitional, dry swallow in between -- this could be attributed to pt's chronic dry mouth. Timely mastication and A-P transfer; oral clearing achieved w/ all consistencies.  During the pharyngeal phase, most liquid trials triggered the pharyngeal swallow at the level of the Valleculae w/ slight spilling from the Valleculae to the Pyriform Sinuses. Suspect this delay could be attributed to pt's Baseline REFLUX over years, and its impact on the pharynx(tissue of). (Note the ENT note in 2020 of direct view of the laryngopharynx.) NO laryngeal penetration nor aspiration was noted w/ any trial consistency given; appropriate airway protection occurred.  During the Esophageal phase, imited (lateral) viewing of the Cervical Esophagus, however, slight/transient bolus stasis noted w/ increased viscosity boluses. Recommend formal assessment and f/u by GI for management as pt has been taking "Gaviscon" for tx, per her report.   PLAN/RECOMMENDATIONS:  A. Diet: Regular diet w/ thin liquids; sips of liquids first b/f other po's to moisten mouth/pharynx. Pills Whole in Puree, if helpful when swallowing d/t Dry Mouth. Discussed products for dry mouth, frequent sips of water/ice chips during the day was recommended.    B. Swallowing Precautions: general aspiration and REFLUX precautions  C. Recommended consultation to: f/u w/ GI for further GERD assessment and management including PPI as indicated   D. Therapy recommendations: None  E. Results and recommendations were discussed w/ patient; video viewed and questions answered. Recommendations discussed and given. Pt agreed  verbally.       Dysphagia, unspecified type - Plan: DG SWALLOW FUNC OP MEDICARE SPEECH PATH, DG SWALLOW FUNC OP MEDICARE SPEECH PATH  Gastroesophageal reflux disease, unspecified whether esophagitis present - Plan: DG SWALLOW FUNC OP MEDICARE SPEECH PATH, DG SWALLOW FUNC OP MEDICARE SPEECH PATH        Problem List Patient Active Problem List   Diagnosis Date Noted   Pulmonary embolus ([place]) 01/31/2020   Atrial flutter ([place]) 01/31/2020   Obesity (BMI 30-39.9) 01/31/2020   Left lower lobe pulmonary nodule 01/29/2020   Chronic obstructive pulmonary disease, unspecified COPD type ([place]) 10/08/2019   Dilated cardiomyopathy ([place]) 05/02/2019   Amaurosis fugax of right eye 01/13/2019   Arm pain, right 11/08/2017   Renal insufficiency 11/08/2017   Tendinitis of right knee 07/03/2017   Lumbar radiculopathy 05/18/2017   Status post hemilaminotomy 05/18/2017   Lumbar degenerative disc disease 05/18/2017   Spinal stenosis of lumbar region without neurogenic claudication 05/18/2017   Atrial tachycardia ([place]) 04/14/2017   SVT (supraventricular tachycardia) ([place])  04/14/2017   Essential hypertension 04/14/2017   Intermittent asthma without complication 41/07/129   Osteoporosis 03/21/2017   Benign essential tremor 03/21/2017   Hx of atrioventricular node ablation 02/01/2008   Hyperlipidemia 05/12/2003   Personal history of transient ischemic attack (TIA), and cerebral infarction without residual deficits 04/22/2003        Orinda Kenner, MS, CCC-SLP Speech Language Pathologist Rehab Services [phone] [facility] 03/09/2021, 5:36 PM  [facility] [place], [place], [address] Phone: [phone]   Fax:     Name: Lindey Renzulli MRN: [id number] Date of Birth: [birth year]

## 2021-03-10 ENCOUNTER — Other Ambulatory Visit: Payer: Self-pay | Admitting: Cardiovascular Disease

## 2021-03-10 DIAGNOSIS — J301 Allergic rhinitis due to pollen: Secondary | ICD-10-CM | POA: Diagnosis not present

## 2021-03-10 DIAGNOSIS — R490 Dysphonia: Secondary | ICD-10-CM | POA: Diagnosis not present

## 2021-03-10 DIAGNOSIS — J310 Chronic rhinitis: Secondary | ICD-10-CM | POA: Diagnosis not present

## 2021-03-10 NOTE — Telephone Encounter (Deleted)
Prescription refill request for Xarelto received.  Indication: PE/TIA Last office visit: 02/19/21  Johnny Bridge MD Weight: 110.4kg Age: 75 Scr: 1.16 on 01/22/21 CrCl: 75  Based on above findings Xarelto 20mg  daily is the appropriate dose.  Refill approved.

## 2021-03-10 NOTE — Telephone Encounter (Signed)
Refill Request.  

## 2021-03-10 NOTE — Telephone Encounter (Signed)
Received refill request for Xarelto 10 mg daily.  Per phone note 01/29/21, Xarelto was d/c'd per Dr. Rockey Situ.   Pt was made aware at that time. Will not send in  refill.

## 2021-03-11 ENCOUNTER — Other Ambulatory Visit: Payer: Self-pay

## 2021-03-11 ENCOUNTER — Other Ambulatory Visit (INDEPENDENT_AMBULATORY_CARE_PROVIDER_SITE_OTHER): Payer: Medicare Other

## 2021-03-11 DIAGNOSIS — Z79899 Other long term (current) drug therapy: Secondary | ICD-10-CM

## 2021-03-12 ENCOUNTER — Other Ambulatory Visit: Payer: Medicare Other

## 2021-03-12 LAB — BASIC METABOLIC PANEL
BUN/Creatinine Ratio: 17 (ref 12–28)
BUN: 17 mg/dL (ref 8–27)
CO2: 20 mmol/L (ref 20–29)
Calcium: 9.3 mg/dL (ref 8.7–10.3)
Chloride: 106 mmol/L (ref 96–106)
Creatinine, Ser: 1.03 mg/dL — ABNORMAL HIGH (ref 0.57–1.00)
Glucose: 92 mg/dL (ref 70–99)
Potassium: 4.8 mmol/L (ref 3.5–5.2)
Sodium: 141 mmol/L (ref 134–144)
eGFR: 57 mL/min/{1.73_m2} — ABNORMAL LOW (ref >59)

## 2021-03-16 NOTE — Addendum Note (Signed)
Encounter addended by: Antonieta Iba, CCC-SLP on: 03/16/2021 9:02 AM  Actions taken: Clinical Note Signed

## 2021-03-18 ENCOUNTER — Encounter: Payer: Self-pay | Admitting: Cardiovascular Disease

## 2021-03-18 ENCOUNTER — Telehealth: Payer: Self-pay | Admitting: Internal Medicine

## 2021-03-18 ENCOUNTER — Other Ambulatory Visit: Payer: Self-pay | Admitting: Internal Medicine

## 2021-03-18 ENCOUNTER — Other Ambulatory Visit: Payer: Self-pay | Admitting: Cardiovascular Disease

## 2021-03-18 DIAGNOSIS — J4520 Mild intermittent asthma, uncomplicated: Secondary | ICD-10-CM

## 2021-03-18 NOTE — Telephone Encounter (Signed)
Requested medications are due for refill today yes  Requested medications are on the active medication list yes  Last refill 12/21/20  Last visit 05/06/21  Future visit scheduled 06/22/21  Notes to clinic Failed protocol due to no valid visit within 6  months, this med is also not delegated, please assess.  Requested Prescriptions  Pending Prescriptions Disp Refills   baclofen (LIORESAL) 10 MG tablet [Pharmacy Med Name: BACLOFEN 10 MG TABLET] 270 tablet 0    Sig: TAKE 1 TABLET BY MOUTH THREE TIMES A DAY     Not Delegated - Analgesics:  Muscle Relaxants Failed - 03/18/2021 11:23 AM      Failed - This refill cannot be delegated      Failed - Valid encounter within last 6 months    Recent Outpatient Visits           10 months ago Essential hypertension   [facility] Glean Hess, MD   1 year ago Diarrhea, unspecified type   [facility] Glean Hess, MD   1 year ago Essential hypertension   [facility] Glean Hess, MD   1 year ago Pain of right thigh   [facility] Glean Hess, MD   2 years ago Essential hypertension   [facility] Glean Hess, MD       Future Appointments             In 3 months Army Melia Jesse Sans, MD [facility] [place]   In 5 months Brendolyn Patty, MD [place]

## 2021-03-19 MED ORDER — FLUTICASONE PROPIONATE HFA 110 MCG/ACT IN AERO
2.0000 | INHALATION_SPRAY | Freq: Two times a day (BID) | RESPIRATORY_TRACT | 0 refills | Status: DC
Start: 2021-03-19 — End: 2021-06-21

## 2021-03-19 MED ORDER — STRIVERDI RESPIMAT 2.5 MCG/ACT IN AERS: 2.0000 | INHALATION_SPRAY | Freq: Every day | RESPIRATORY_TRACT | 2 refills | Status: DC

## 2021-03-19 NOTE — Telephone Encounter (Signed)
Rx for Flovent and Striverdi has been sent to preferred pharmacy.  Patient is aware and voiced her understanding.  Nothing further needed at this time.

## 2021-03-23 ENCOUNTER — Telehealth: Payer: Self-pay | Admitting: Internal Medicine

## 2021-03-23 ENCOUNTER — Other Ambulatory Visit: Payer: Self-pay | Admitting: Internal Medicine

## 2021-03-23 NOTE — Telephone Encounter (Addendum)
Instructions   Return in about 1 year (around 09/02/2021). Continue inhalers as prescribed-LET's hold FLOVENT and assess breathing Avoid triggers      Spoke to patient, who stated that she held flovent for two days and she developed some increased sob.  She is going to resume flovent. She will call back if she does not have relief in sx after resuming inhaler.  Nothing further needed at this time.   Routing to Dr. Mortimer Fries as an Juluis Rainier.

## 2021-03-24 NOTE — Telephone Encounter (Signed)
Refill for BAclofen has been picked up per pharmacy. 03/22/21

## 2021-03-31 ENCOUNTER — Emergency Department
Admission: EM | Admit: 2021-03-31 | Discharge: 2021-03-31 | Disposition: A | Discharge status: Home / Self Care | Payer: Medicare Other | Attending: Emergency Medicine | Admitting: Emergency Medicine

## 2021-03-31 ENCOUNTER — Encounter: Payer: Self-pay | Admitting: Emergency Medicine

## 2021-03-31 ENCOUNTER — Other Ambulatory Visit: Payer: Self-pay

## 2021-03-31 ENCOUNTER — Emergency Department: Payer: Medicare Other

## 2021-03-31 DIAGNOSIS — Z85828 Personal history of other malignant neoplasm of skin: Secondary | ICD-10-CM | POA: Insufficient documentation

## 2021-03-31 DIAGNOSIS — Z96611 Presence of right artificial shoulder joint: Secondary | ICD-10-CM | POA: Insufficient documentation

## 2021-03-31 DIAGNOSIS — R000 Tachycardia, unspecified: Secondary | ICD-10-CM | POA: Diagnosis not present

## 2021-03-31 DIAGNOSIS — J449 Chronic obstructive pulmonary disease, unspecified: Secondary | ICD-10-CM | POA: Insufficient documentation

## 2021-03-31 DIAGNOSIS — Z79899 Other long term (current) drug therapy: Secondary | ICD-10-CM | POA: Diagnosis not present

## 2021-03-31 DIAGNOSIS — Z7951 Long term (current) use of inhaled steroids: Secondary | ICD-10-CM | POA: Diagnosis not present

## 2021-03-31 DIAGNOSIS — Z96651 Presence of right artificial knee joint: Secondary | ICD-10-CM | POA: Diagnosis not present

## 2021-03-31 DIAGNOSIS — R42 Dizziness and giddiness: Secondary | ICD-10-CM | POA: Diagnosis not present

## 2021-03-31 DIAGNOSIS — R002 Palpitations: Secondary | ICD-10-CM | POA: Diagnosis not present

## 2021-03-31 DIAGNOSIS — J45909 Unspecified asthma, uncomplicated: Secondary | ICD-10-CM | POA: Insufficient documentation

## 2021-03-31 DIAGNOSIS — Z20822 Contact with and (suspected) exposure to covid-19: Secondary | ICD-10-CM | POA: Diagnosis not present

## 2021-03-31 DIAGNOSIS — I10 Essential (primary) hypertension: Secondary | ICD-10-CM | POA: Diagnosis not present

## 2021-03-31 DIAGNOSIS — R079 Chest pain, unspecified: Secondary | ICD-10-CM | POA: Diagnosis not present

## 2021-03-31 LAB — CBC
HCT: 45.8 % (ref 36.0–46.0)
Hemoglobin: 15.9 g/dL — ABNORMAL HIGH (ref 12.0–15.0)
MCH: 32.2 pg (ref 26.0–34.0)
MCHC: 34.7 g/dL (ref 30.0–36.0)
MCV: 92.7 fL (ref 80.0–100.0)
Platelets: 250 10*3/uL (ref 150–400)
RBC: 4.94 MIL/uL (ref 3.87–5.11)
RDW: 12 % (ref 11.5–15.5)
WBC: 8.2 10*3/uL (ref 4.0–10.5)
nRBC: 0 % (ref 0.0–0.2)

## 2021-03-31 LAB — BASIC METABOLIC PANEL
Anion gap: 7 (ref 5–15)
BUN: 14 mg/dL (ref 8–23)
CO2: 26 mmol/L (ref 22–32)
Calcium: 9.1 mg/dL (ref 8.9–10.3)
Chloride: 109 mmol/L (ref 98–111)
Creatinine, Ser: 1.24 mg/dL — ABNORMAL HIGH (ref 0.44–1.00)
GFR, Estimated: 46 mL/min — ABNORMAL LOW (ref >60)
Glucose, Bld: 116 mg/dL — ABNORMAL HIGH (ref 70–99)
Potassium: 4.1 mmol/L (ref 3.5–5.1)
Sodium: 142 mmol/L (ref 135–145)

## 2021-03-31 LAB — MAGNESIUM: Magnesium: 2.6 mg/dL — ABNORMAL HIGH (ref 1.7–2.4)

## 2021-03-31 LAB — TROPONIN I (HIGH SENSITIVITY)
Troponin I (High Sensitivity): 6 ng/L (ref <18)
Troponin I (High Sensitivity): 4 ng/L (ref <18)

## 2021-03-31 LAB — RESP PANEL BY RT-PCR (FLU A&B, COVID) ARPGX2
Influenza A by PCR: NEGATIVE
Influenza B by PCR: NEGATIVE
SARS Coronavirus 2 by RT PCR: NEGATIVE

## 2021-03-31 MED ORDER — LACTATED RINGERS IV BOLUS
500.0000 mL | Freq: Once | INTRAVENOUS | Status: AC
  Administered 2021-03-31: 500 mL via INTRAVENOUS

## 2021-03-31 NOTE — ED Triage Notes (Signed)
Pt comes into the ED via POV c/o HTN.   Pt states that she has been weaker today than normal.  Pt states this is new and she knew something was "off" and that is why she came.  Pt denies any dizziness, SHOB, chest pain, or nausea.  Pt doesn't remember what her BP was at home.  Pt did take her BP medication this morning.

## 2021-03-31 NOTE — ED Provider Notes (Signed)
[facility] Emergency Department Provider Note  ____________________________________________   Event Date/Time   First MD Initiated Contact with Patient 03/31/21 1457     (approximate)  I have reviewed the triage vital signs and the nursing notes.   HISTORY  Chief Complaint Hypertension   HPI Audrey Martinez is a 75 y.o. female with past medical history of chronic sinus tachycardia, SVT and atrial tachycardia status post ablation x2 as well as nonischemic cardiomyopathy with an EF of 30 to 35%, chronic lung disease, arthritis, asthma, GERD, HTN, HDL, CVA and traumatic subdural hematoma in August managed conservatively who presents for assessment of "funny sensation" in her chest that occurred earlier today.  She states it feels much better now.  She states it was not painful just funny feeling.  She denies any associated headache or earache, sore throat, vomiting, diarrhea, nausea, burning with urination, any other acute pain, rash or recent falls or injuries.  States he took her daily Cardizem and other blood pressure medicines today.  She states her blood pressure cuff was not reading appropriately and she was worried her blood pressure may been high.  No other acute concerns at this time.         Past Medical History:  Diagnosis Date   Actinic keratosis    Acute pulmonary embolus ([place]) 01/29/2020   Acute renal failure (ARF) ([place]) 01/31/2020   Acute respiratory failure with hypoxia ([place]) 01/29/2020   Acute septic pulmonary embolism (HCC)    AKI (acute kidney injury) ([place])    Amaurosis fugax, right eye 12/2018   Arthritis    Asthma    Basal cell carcinoma 07/2016   central forehead (tx in [place])   Cardiomyopathy [facility])    a. 04/2018 Echo: EF 45-50%. Anteroseptal and apical HK in some views. Mild AI/MR. Nl RV fxn; b. 05/2018 MV: mid-dist ant and antsept mild defect - ? breast atten. EF 41%. No ischemia; c. 03/2019 Echo: EF 30-35%, Gr2 DD, Trace MR,  triv TR. Asc Ao ectatic dil - 55mm.    GERD (gastroesophageal reflux disease)    Herniated disc, cervical    Hyperlipidemia    Left wrist fracture    Melanoma ([place]) 05/12/2020   Melanoma IS R post neck, excised 08/10/20   Osteoporosis    Pneumonia due to COVID-19 virus 01/26/2020   Stroke ([place]) 1998   SVT (supraventricular tachycardia) ([place])    a.2008 s/p RFCA for AVNRT; b. 9 & 02/2019 Zio x 2: 1. Avg HR 89, 3 runs NSVT (longest 7 beats), Mobitz 1. 2. Avg HR 89, NSVT x 1 (5 beats), Mobitz 1. No signif arrhythmia.    Patient Active Problem List   Diagnosis Date Noted   Pulmonary embolus ([place]) 01/31/2020   Atrial flutter ([place]) 01/31/2020   Obesity (BMI 30-39.9) 01/31/2020   Left lower lobe pulmonary nodule 01/29/2020   Chronic obstructive pulmonary disease, unspecified COPD type ([place]) 10/08/2019   Dilated cardiomyopathy ([place]) 05/02/2019   Amaurosis fugax of right eye 01/13/2019   Arm pain, right 11/08/2017   Renal insufficiency 11/08/2017   Tendinitis of right knee 07/03/2017   Lumbar radiculopathy 05/18/2017   Status post hemilaminotomy 05/18/2017   Lumbar degenerative disc disease 05/18/2017   Spinal stenosis of lumbar region without neurogenic claudication 05/18/2017   Atrial tachycardia ([place]) 04/14/2017   SVT (supraventricular tachycardia) ([place]) 04/14/2017   Essential hypertension 04/14/2017   Intermittent asthma without complication 26/83/4196   Osteoporosis 03/21/2017   Benign essential tremor 03/21/2017  Hx of atrioventricular node ablation 02/01/2008   Hyperlipidemia 05/12/2003   Personal history of transient ischemic attack (TIA), and cerebral infarction without residual deficits 04/22/2003    Past Surgical History:  Procedure Laterality Date   BACK SURGERY  2017   L2-4 laminectomy and foraminal stenosis   BREAST CYST ASPIRATION Left 1977   CARDIAC CATHETERIZATION  2009   [facility] [place]   2009   CATARACT EXTRACTION, BILATERAL  2010   COLONOSCOPY WITH PROPOFOL N/A 03/21/2018   Procedure: COLONOSCOPY WITH Biopsy;  Surgeon: Lin Landsman, MD;  Location: [place];  Service: Endoscopy;  Laterality: N/A;   derrick procedure  1977-78   bilateral    ESOPHAGOGASTRODUODENOSCOPY  2013   gastritis; done for complaint of dysphagia   ESOPHAGOGASTRODUODENOSCOPY (EGD) WITH PROPOFOL N/A 03/21/2018   Procedure: ESOPHAGOGASTRODUODENOSCOPY (EGD) WITH Biopsies;  Surgeon: Lin Landsman, MD;  Location: [place];  Service: Endoscopy;  Laterality: N/A;  KEEP THIS PATIENT FIRST   LEFT HEART CATH AND CORONARY ANGIOGRAPHY Left 05/02/2019   Procedure: LEFT HEART CATH AND CORONARY ANGIOGRAPHY;  Surgeon: Minna Merritts, MD;  Location: [place] [facility];  Service: Cardiovascular;  Laterality: Left;   lymph node removal     neck   MOHS SURGERY  07/2016   BCCA nose   POLYPECTOMY N/A 03/21/2018   Procedure: POLYPECTOMY;  Surgeon: Lin Landsman, MD;  Location: [place];  Service: Endoscopy;  Laterality: N/A;   REPLACEMENT TOTAL KNEE  2013   RT   TOTAL SHOULDER REPLACEMENT  2012   RT     Prior to Admission medications   Medication Sig Start Date End Date Taking? Authorizing Provider  albuterol (PROVENTIL HFA;VENTOLIN HFA) 108 (90 Base) MCG/ACT inhaler Inhale 1-2 puffs into the lungs every 6 (six) hours as needed for wheezing or shortness of breath.     [provider]  B Complex-C (B-COMPLEX WITH VITAMIN C) tablet Take 1 tablet by mouth daily.     [provider]  baclofen (LIORESAL) 10 MG tablet TAKE 1 TABLET BY MOUTH THREE TIMES A DAY 03/22/21   Glean Hess, MD  Bioflavonoid Products (ESTER-C) TABS Take 1 tablet by mouth 2 (two) times daily.     [provider]  calcium carbonate (OS-CAL - DOSED IN MG OF ELEMENTAL CALCIUM) 1250 (500 Ca) MG tablet Take 1 tablet by mouth.    [provider]  carvedilol  (COREG) 3.125 MG tablet TAKE 1 TABLET (3.125 MG TOTAL) BY MOUTH 2 (TWO) TIMES DAILY WITH A MEAL. 03/18/21   Minna Merritts, MD  carvedilol (COREG) 6.25 MG tablet Take 1 tablet (6.25 mg total) by mouth 2 (two) times daily with a meal. Patient taking differently: Take 3.125 mg by mouth 2 (two) times daily with a meal. 01/04/21   Gollan, Kathlene November, MD  cholecalciferol (VITAMIN D) 25 MCG (1000 UT) tablet Take 1,000 Units by mouth daily.    [provider]  Cranberry 1000 MG CAPS Take 1,000 mg by mouth daily.     [provider]  ezetimibe (ZETIA) 10 MG tablet TAKE 1 TABLET BY MOUTH EVERY DAY 10/29/20   Glean Hess, MD  famotidine (PEPCID) 20 MG tablet Take 1 tablet (20 mg total) by mouth 2 (two) times daily for 10 days. 01/20/21 01/30/21  Marlana Salvage, PA  fluticasone (FLOVENT HFA) 110 MCG/ACT inhaler Inhale 2 puffs into the lungs 2 (two) times daily.  03/19/21   Flora Lipps, MD  gabapentin (NEURONTIN) 300 MG capsule TAKE 1 CAPSULE BY MOUTH 3 TIMES DAILY. AND TAKE 3 CAPSULES DAILY AT BEDTIME. 06/22/20   Glean Hess, MD  Ginger, Zingiber officinalis, (GINGER PO) Take 1,100 mg by mouth daily.     [provider]  MAG ASPART-POTASSIUM ASPART PO Take 1 tablet by mouth 2 (two) times daily. 600 mg-198 mg    [provider]  Misc Natural Products (TART CHERRY ADVANCED PO) Take 1 capsule by mouth daily.     [provider]  montelukast (SINGULAIR) 10 MG tablet Take 10 mg by mouth every evening. 10/25/18   [provider]  Multiple Vitamins-Minerals (MULTIVITAMIN WITH MINERALS) tablet Take 1 tablet by mouth daily. Centrum Silver    [provider]  mupirocin ointment (BACTROBAN) 2 % Apply 1 application topically daily. Qd to excision site with dressing changes 08/10/20   Brendolyn Patty, MD  Olodaterol HCl (STRIVERDI RESPIMAT) 2.5 MCG/ACT AERS Inhale 2 puffs into the lungs daily. USE AT THE SAME TIME EVERYDAY 03/19/21   Flora Lipps, MD   predniSONE (DELTASONE) 10 MG tablet Take 40 mg on day 1 and decrease by 10mg  each day -- 40, then 30, then 20, then 10 for a total of 4 days. 01/20/21   Marlana Salvage, PA  Probiotic Product (PROBIOTIC DAILY PO) Take 1 capsule by mouth 2 (two) times a week.     [provider]  rivaroxaban (XARELTO) 20 MG TABS tablet Take 1 tablet (20 mg total) by mouth as needed (PRN for long car or plane ride). 02/19/21   Minna Merritts, MD  sacubitril-valsartan (ENTRESTO) 24-26 MG Take 1 tablet by mouth 2 (two) times daily. 02/19/21   Minna Merritts, MD  Turmeric 500 MG CAPS Take 500 mg by mouth daily.     [provider]  zinc sulfate 220 (50 Zn) MG capsule Take 1 capsule (220 mg total) by mouth daily. 02/02/20   Allie Bossier, MD    Allergies Amoxicillin, Aspirin, Ciprofloxacin, Ciprofloxacin-dexamethasone, Crestor [rosuvastatin], Morphine and related, Doxycycline hyclate, Hydrochlorothiazide, Keflex [cephalexin], Lovastatin, Simvastatin, Erythromycin, Ibuprofen, Neomycin-bacitracin zn-polymyx, Omeprazole, and Tape  Family History  Problem Relation Age of Onset   Diabetes Mother    Stroke Mother    Valvular heart disease Father    CAD Sister 69       died of MI   Stroke Sister    Stroke Sister 76   Clotting disorder Half-Sister    Clotting disorder Half-Sister    Breast cancer Neg Hx     Social History Social History   Tobacco Use   Smoking status: Never   Smokeless tobacco: Never  Vaping Use   Vaping Use: Never used  Substance Use Topics   Alcohol use: Not Currently    Comment: may drink 1-2x/yr   Drug use: No    Review of Systems  Review of Systems  Constitutional:  Negative for chills and fever.  HENT:  Negative for sore throat.   Eyes:  Negative for pain.  Respiratory:  Negative for cough and stridor.   Cardiovascular:  Positive for palpitations ("funny feeling"). Negative for chest pain.  Gastrointestinal:  Negative for vomiting.  Skin:  Negative  for rash.  Neurological:  Negative for seizures, loss of consciousness and headaches.  Psychiatric/Behavioral:  Negative for suicidal ideas.   All other systems reviewed and are negative.    ____________________________________________   PHYSICAL EXAM:  VITAL SIGNS: ED Triage Vitals  Enc Vitals Group     BP 03/31/21 1113 117/81     Pulse Rate 03/31/21 1113 92     Resp 03/31/21 1113 19     Temp 03/31/21 1113 98.1 F (36.7 C)     Temp Source 03/31/21 1113 Oral     SpO2 03/31/21 1113 91 %     Weight 03/31/21 1113 243 lb 6.2 oz (110.4 kg)     Height 03/31/21 1113 5\' 6"  (1.676 m)     Head Circumference --      Peak Flow --      Pain Score 03/31/21 1113 0     Pain Loc --      Pain Edu? --      Excl. in [place]? --    Vitals:   03/31/21 1658 03/31/21 1735  BP: 120/64 121/74  Pulse: 79 76  Resp: 19 16  Temp:  98 F (36.7 C)  SpO2: 97% 98%   Physical Exam Vitals and nursing note reviewed.  Constitutional:      General: She is not in acute distress.    Appearance: She is well-developed. She is obese.  HENT:     Head: Normocephalic and atraumatic.     Right Ear: External ear normal.     Left Ear: External ear normal.     Nose: Nose normal.  Eyes:     Conjunctiva/sclera: Conjunctivae normal.  Cardiovascular:     Rate and Rhythm: Normal rate and regular rhythm.     Heart sounds: No murmur heard. Pulmonary:     Effort: Pulmonary effort is normal. No respiratory distress.     Breath sounds: Normal breath sounds.  Abdominal:     Palpations: Abdomen is soft.     Tenderness: There is no abdominal tenderness.  Musculoskeletal:     Cervical back: Neck supple.  Skin:    General: Skin is warm and dry.  Neurological:     Mental Status: She is alert and oriented to person, place, and time.  Psychiatric:        Mood and Affect: Mood normal.     ____________________________________________   LABS (all labs ordered are listed, but only abnormal results are displayed)  Labs  Reviewed  BASIC METABOLIC PANEL - Abnormal; Notable for the following components:      Result Value   Glucose, Bld 116 (*)    Creatinine, Ser 1.24 (*)    GFR, Estimated 46 (*)    All other components within normal limits  CBC - Abnormal; Notable for the following components:   Hemoglobin 15.9 (*)    All other components within normal limits  MAGNESIUM - Abnormal; Notable for the following components:   Magnesium 2.6 (*)    All other components within normal limits  RESP PANEL BY RT-PCR (FLU A&B, COVID) ARPGX2  TROPONIN I (HIGH SENSITIVITY)  TROPONIN I (HIGH SENSITIVITY)   ____________________________________________  EKG  Initial ECG remarkable for tachycardia with a ventricular rate of 132, QRS of 122 without clear evidence of acute ischemia.  There is an atrial premature complex.  Repeat ECG remarkable for sinus rhythm with a ventricular rate of 67, first-degree block with a.  #338 without other evidence of acute ischemia or significant arrhythmia.   ____________________________________________  RADIOLOGY  ED MD interpretation: Chest x-ray without effusion, edema, focal consolidation, pneumothorax or other acute thoracic process.  Official radiology report(s): DG Chest 2 View  Result Date: 03/31/2021 CLINICAL DATA:  Pt complains of HTN. Pt states that she has  been weaker today than normal. Pt states this is new and she knew something was "off" and that is why she came. Pt denies any dizziness, SHOB, chest pain, or nausea. Pt doesn't remember what her BP was at home. Pt did take her BP medication this morning. EXAM: CHEST - 2 VIEW COMPARISON:  01/26/2020 and older exams.  CT, 01/28/2020. FINDINGS: Cardiac silhouette is normal in size. No mediastinal or hilar masses. No evidence of adenopathy. Stable elevation of the right hemidiaphragm. Mild linear/reticular opacities in the lower lungs consistent with atelectasis and/or scarring. Lungs otherwise clear. No pleural effusion or  pneumothorax. Stable right shoulder reverse arthroplasty. No acute skeletal abnormality. IMPRESSION: No active cardiopulmonary disease. Electronically Signed   By: Lajean Manes M.D.   On: 03/31/2021 16:24    ____________________________________________   PROCEDURES  Procedure(s) performed (including Critical Care):  .1-3 Lead EKG Interpretation Performed by: Lucrezia Starch, MD Authorized by: Lucrezia Starch, MD     Interpretation: non-specific     ECG rate assessment: normal     Rhythm: sinus rhythm     Ectopy: none     Conduction: abnormal     Abnormal conduction: 1st degree AV block     ____________________________________________   INITIAL IMPRESSION / ASSESSMENT AND PLAN / ED COURSE      Patient presents with above-stated history exam for assessment of a funny feeling she had earlier this morning that seems to have resolved.  She denies any associated pain, dizziness, shortness of breath or other clear associated symptoms.  She was initially concerned that it was related to her blood pressure as her blood pressure monitor was not working properly.  On arrival she was noted to have been SPO2 of 91% although on recheck it was 96% and she was 100% on the monitor throughout my exam.  She is otherwise afebrile with stable vital signs.  Exam is largely unremarkable as above.  Differential includes atypical presentation for ACS, symptomatic arrhythmia, metabolic derangements, and anemia.  I have a low suspicion for PE as patient is denying any chest pain or shortness of breath and on multiple rechecks has no evidence of hypoxia after observing her for several hours on continuous pulse ox.  Chest x-ray without effusion, edema, focal consolidation, pneumothorax or other acute thoracic process.  ECG remarkable for nonspecific tachycardia seem to have resolved on its own on recheck.  Troponins nonelevated x2 not suggestive of ischemia or ACS.  COVID influenza is negative.  CBC without  leukocytosis or acute anemia.  BMP without significant electrolyte or metabolic derangements.  Magnesium 2.6.  Discussed with on-call cardiologist Dr.   Garen Lah  He did not recommend any additional interventions given resolution of tachycardia recommend outpatient cardiology follow-up.  I think this is reasonable I suspect likely paroxysmal tachycardia but seems to have resolved possibly after patient's Cardizem started to reach therapeutic levels when she took it this morning.  Given stable vitals with eyes reassuring exam work-up and patient denying any symptoms in the emergency room after approximately 7 hours of observation I think outpatient follow-up is reasonable.  Discharged in stable condition.     ____________________________________________   FINAL CLINICAL IMPRESSION(S) / ED DIAGNOSES  Final diagnoses:  Palpitations  Tachycardia    Medications  lactated ringers bolus 500 mL (0 mLs Intravenous Stopped 03/31/21 1611)     ED Discharge Orders     None        Note:  This document was prepared using Dragon voice recognition  software and may include unintentional dictation errors.    Lucrezia Starch, MD 03/31/21 [phone]

## 2021-03-31 NOTE — ED Notes (Signed)
Patient transported to X-ray 

## 2021-04-04 NOTE — Progress Notes (Deleted)
Cardiology Office Note:    Date:  04/04/2021   ID:  Audrey Martinez, Audrey Martinez 04/12/1946, MRN [id number]  PCP:  Glean Hess, MD  [facility] Cardiologist:  Ida Rogue, MD  [facility] Electrophysiologist:  None   Referring MD: Glean Hess, MD   Chief Complaint: ER follow-up  History of Present Illness:    Audrey Martinez is a 75 y.o. female with a hx of Sinus tachycardia, SVT, atrial tachycardia s/p ablation x 2, PSVT s/p ablation of slow pathway for AVNRT, chronic lung disease, orthostasis, migraines, NICM, EF 30-35%, cath with nonobstructive CAD in December 2020, h/o bilateral PE on Xarelto,   Last seen 09/11/20 and was stable from a cardiac perspective.   Seen in the ER 03/31/21 for hypertension. EKG showed sinus tachycardia, rate 132. CXR unremarkable.   Today  Past Medical History:  Diagnosis Date   Actinic keratosis    Acute pulmonary embolus ([place]) 01/29/2020   Acute renal failure (ARF) ([place]) 01/31/2020   Acute respiratory failure with hypoxia ([place]) 01/29/2020   Acute septic pulmonary embolism (HCC)    AKI (acute kidney injury) ([place])    Amaurosis fugax, right eye 12/2018   Arthritis    Asthma    Basal cell carcinoma 07/2016   central forehead (tx in [place])   Cardiomyopathy [facility])    a. 04/2018 Echo: EF 45-50%. Anteroseptal and apical HK in some views. Mild AI/MR. Nl RV fxn; b. 05/2018 MV: mid-dist ant and antsept mild defect - ? breast atten. EF 41%. No ischemia; c. 03/2019 Echo: EF 30-35%, Gr2 DD, Trace MR, triv TR. Asc Ao ectatic dil - 26mm.    GERD (gastroesophageal reflux disease)    Herniated disc, cervical    Hyperlipidemia    Left wrist fracture    Melanoma ([place]) 05/12/2020   Melanoma IS R post neck, excised 08/10/20   Osteoporosis    Pneumonia due to COVID-19 virus 01/26/2020   Stroke ([place]) 1998   SVT (supraventricular tachycardia) ([place])    a.2008 s/p RFCA for AVNRT; b. 9 & 02/2019 Zio x 2: 1. Avg HR 89, 3 runs NSVT (longest 7 beats),  Mobitz 1. 2. Avg HR 89, NSVT x 1 (5 beats), Mobitz 1. No signif arrhythmia.    Past Surgical History:  Procedure Laterality Date   BACK SURGERY  2017   L2-4 laminectomy and foraminal stenosis   BREAST CYST ASPIRATION Left 1977   CARDIAC CATHETERIZATION  2009   [facility] [place]  2009   CATARACT EXTRACTION, BILATERAL  2010   COLONOSCOPY WITH PROPOFOL N/A 03/21/2018   Procedure: COLONOSCOPY WITH Biopsy;  Surgeon: Lin Landsman, MD;  Location: [place];  Service: Endoscopy;  Laterality: N/A;   derrick procedure  1977-78   bilateral    ESOPHAGOGASTRODUODENOSCOPY  2013   gastritis; done for complaint of dysphagia   ESOPHAGOGASTRODUODENOSCOPY (EGD) WITH PROPOFOL N/A 03/21/2018   Procedure: ESOPHAGOGASTRODUODENOSCOPY (EGD) WITH Biopsies;  Surgeon: Lin Landsman, MD;  Location: [place];  Service: Endoscopy;  Laterality: N/A;  KEEP THIS PATIENT FIRST   LEFT HEART CATH AND CORONARY ANGIOGRAPHY Left 05/02/2019   Procedure: LEFT HEART CATH AND CORONARY ANGIOGRAPHY;  Surgeon: Minna Merritts, MD;  Location: [place] [facility];  Service: Cardiovascular;  Laterality: Left;   lymph node removal     neck   MOHS SURGERY  07/2016   BCCA nose   POLYPECTOMY N/A 03/21/2018   Procedure: POLYPECTOMY;  Surgeon: Lin Landsman, MD;  Location: [place];  Service: Endoscopy;  Laterality: N/A;   REPLACEMENT TOTAL KNEE  2013   RT   TOTAL SHOULDER REPLACEMENT  2012   RT     Current Medications: No outpatient medications have been marked as taking for the 04/05/21 encounter (Appointment) with Kathlen Mody, Rue Tinnel H, PA-C.     Allergies:   Amoxicillin, Aspirin, Ciprofloxacin, Ciprofloxacin-dexamethasone, Crestor [rosuvastatin], Morphine and related, Doxycycline hyclate, Hydrochlorothiazide, Keflex [cephalexin], Lovastatin, Simvastatin, Erythromycin, Ibuprofen, Neomycin-bacitracin zn-polymyx,  Omeprazole, and Tape   Social History   Socioeconomic History   Marital status: Widowed    Spouse name: Not on file   Number of children: 3   Years of education: Not on file   Highest education level: Not on file  Occupational History   Occupation: retired  Tobacco Use   Smoking status: Never   Smokeless tobacco: Never  Vaping Use   Vaping Use: Never used  Substance and Sexual Activity   Alcohol use: Not Currently    Comment: may drink 1-2x/yr   Drug use: No   Sexual activity: Never  Other Topics Concern   Not on file  Social History Narrative   ** Merged History Encounter **       Lives at home alone, has resources if help is needed   Social Determinants of Radio broadcast assistant Strain: Low Risk    Difficulty of Paying Living Expenses: Not hard at all  Food Insecurity: No Food Insecurity   Worried About Charity fundraiser in the Last Year: Never true   Arboriculturist in the Last Year: Never true  Transportation Needs: No Transportation Needs   Lack of Transportation (Medical): No   Lack of Transportation (Non-Medical): No  Physical Activity: Inactive   Days of Exercise per Week: 0 days   Minutes of Exercise per Session: 0 min  Stress: No Stress Concern Present   Feeling of Stress : Not at all  Social Connections: Moderately Integrated   Frequency of Communication with Friends and Family: More than three times a week   Frequency of Social Gatherings with Friends and Family: Three times a week   Attends Religious Services: More than 4 times per year   Active Member of Clubs or Organizations: No   Attends Archivist Meetings: Never   Marital Status: Married     Family History: The patient's ***family history includes CAD (age of onset: 34) in her sister; Clotting disorder in her half-sister and half-sister; Diabetes in her mother; Stroke in her mother and sister; Stroke (age of onset: 3) in her sister; Valvular heart disease in her father. There  is no history of Breast cancer.  ROS:   Please see the history of present illness.    *** All other systems reviewed and are negative.  EKGs/Labs/Other Studies Reviewed:    The following studies were reviewed today: ***  EKG:  EKG is *** ordered today.  The ekg ordered today demonstrates ***  Recent Labs: 01/22/2021: ALT 20 03/31/2021: BUN 14; Creatinine, Ser 1.24; Hemoglobin 15.9; Magnesium 2.6; Platelets 250; Potassium 4.1; Sodium 142  Recent Lipid Panel    Component Value Date/Time   CHOL 113 01/30/2020 0547   TRIG 27 01/30/2020 0547   HDL 40 (L) 01/30/2020 0547   CHOLHDL 2.8 01/30/2020 0547   VLDL 5 01/30/2020 0547   LDLCALC 68 01/30/2020 0547     Risk Assessment/Calculations:   {Does this patient have ATRIAL FIBRILLATION?:[phone]}  Physical Exam:    VS:  There were no vitals taken for this visit.    Wt Readings from Last 3 Encounters:  03/31/21 243 lb 6.2 oz (110.4 kg)  02/19/21 243 lb 6.4 oz (110.4 kg)  01/22/21 230 lb (104.3 kg)     GEN: *** Well nourished, well developed in no acute distress HEENT: Normal NECK: No JVD; No carotid bruits LYMPHATICS: No lymphadenopathy CARDIAC: ***RRR, no murmurs, rubs, gallops RESPIRATORY:  Clear to auscultation without rales, wheezing or rhonchi  ABDOMEN: Soft, non-tender, non-distended MUSCULOSKELETAL:  No edema; No deformity  SKIN: Warm and dry NEUROLOGIC:  Alert and oriented x 3 PSYCHIATRIC:  Normal affect   ASSESSMENT:    No diagnosis found. PLAN:    In order of problems listed above:  ***  Disposition: Follow up {follow up:15908} with ***   Shared Decision Making/Informed Consent   {Are you ordering a CV Procedure (e.g. stress test, cath, DCCV, TEE, etc)?   Press F2        :[id number]}    Signed, Blanche Scovell Ninfa [place], PA-C  04/04/2021 9:21 PM    [facility]

## 2021-04-05 ENCOUNTER — Ambulatory Visit: Payer: 59 | Admitting: Medical

## 2021-04-05 ENCOUNTER — Telehealth: Payer: Self-pay | Admitting: Medical

## 2021-04-05 NOTE — Telephone Encounter (Signed)
Patient reports that she did not wake up to her alarm. She stated this appointment was in follow up to her ED visit. Now she is not having any problems, symptoms, and does not feel she needs to be in a rush to be seen. Offered to reschedule for another month out and she was agreeable with that plan. Scheduled her to come in December and instructed her to please call if she should have any return in her symptoms. She verbalized understanding of our conversation, agreement with plan, and had no further questions at this time.

## 2021-04-05 NOTE — Telephone Encounter (Signed)
Patient missed her appointment this morning and just wanted to check on her. Left message for her to call back when possible.

## 2021-04-07 ENCOUNTER — Other Ambulatory Visit: Payer: Self-pay

## 2021-04-07 ENCOUNTER — Ambulatory Visit (INDEPENDENT_AMBULATORY_CARE_PROVIDER_SITE_OTHER): Payer: Medicare Other

## 2021-04-07 VITALS — BP 130/82 | HR 129 | Temp 98.5°F | Resp 22 | Ht 66.0 in | Wt 244.8 lb

## 2021-04-07 DIAGNOSIS — Z0000 Encounter for general adult medical examination without abnormal findings: Secondary | ICD-10-CM

## 2021-04-07 NOTE — Progress Notes (Signed)
Subjective:   Audrey Martinez is a 75 y.o. female who presents for Medicare Annual (Subsequent) preventive examination.  Review of Systems     Cardiac Risk Factors include: advanced age (>65men, >47 women)     Objective:    Today's Vitals   04/07/21 0822 04/07/21 0906  BP: 130/82   Pulse: (!) 129   Resp: (!) 22   Temp: 98.5 F (36.9 C)   TempSrc: Oral   SpO2: 94%   Weight: 244 lb 12.8 oz (111 kg)   Height: 5\' 6"  (1.676 m)   PainSc:  0-No pain   Body mass index is 39.51 kg/m.  Advanced Directives 03/31/2021 01/22/2021 01/20/2021 05/28/2020 04/06/2020 01/26/2020 05/02/2019  Does Patient Have a Medical Advance Directive? Yes No No Yes Yes Yes Yes  Type of Advance Directive Living will - - [place];Living will [place];Living will [place];Living will Out of facility DNR (pink MOST or yellow form)  Does patient want to make changes to medical advance directive? - - - - - Yes (ED - Information included in AVS) No - Patient declined  Copy of [place] in Chart? - - - - No - copy requested - -  Would patient like information on creating a medical advance directive? - - - - - - No - Patient declined    Current Medications (verified) Outpatient Encounter Medications as of 04/07/2021  Medication Sig   albuterol (PROVENTIL HFA;VENTOLIN HFA) 108 (90 Base) MCG/ACT inhaler Inhale 1-2 puffs into the lungs every 6 (six) hours as needed for wheezing or shortness of breath.    B Complex-C (B-COMPLEX WITH VITAMIN C) tablet Take 1 tablet by mouth daily.    baclofen (LIORESAL) 10 MG tablet TAKE 1 TABLET BY MOUTH THREE TIMES A DAY   Bioflavonoid Products (ESTER-C) TABS Take 1 tablet by mouth 2 (two) times daily.    calcium carbonate (OS-CAL - DOSED IN MG OF ELEMENTAL CALCIUM) 1250 (500 Ca) MG tablet Take 1 tablet by mouth.   carvedilol (COREG) 3.125 MG tablet TAKE 1 TABLET (3.125 MG TOTAL) BY MOUTH 2 (TWO) TIMES DAILY  WITH A MEAL.   carvedilol (COREG) 6.25 MG tablet Take 1 tablet (6.25 mg total) by mouth 2 (two) times daily with a meal. (Patient taking differently: Take 3.125 mg by mouth 2 (two) times daily with a meal.)   cholecalciferol (VITAMIN D) 25 MCG (1000 UT) tablet Take 1,000 Units by mouth daily.   Cranberry 1000 MG CAPS Take 1,000 mg by mouth daily.    ezetimibe (ZETIA) 10 MG tablet TAKE 1 TABLET BY MOUTH EVERY DAY   fluticasone (FLOVENT HFA) 110 MCG/ACT inhaler Inhale 2 puffs into the lungs 2 (two) times daily.   gabapentin (NEURONTIN) 300 MG capsule TAKE 1 CAPSULE BY MOUTH 3 TIMES DAILY. AND TAKE 3 CAPSULES DAILY AT BEDTIME.   Ginger, Zingiber officinalis, (GINGER PO) Take 1,100 mg by mouth daily.    ipratropium (ATROVENT) 0.03 % nasal spray SMARTSIG:1-2 Spray(s) Both Nares 3 Times Daily   MAG ASPART-POTASSIUM ASPART PO Take 1 tablet by mouth 2 (two) times daily. 600 mg-198 mg   Misc Natural Products (TART CHERRY ADVANCED PO) Take 1 capsule by mouth daily.    montelukast (SINGULAIR) 10 MG tablet Take 10 mg by mouth every evening.   Multiple Vitamins-Minerals (MULTIVITAMIN WITH MINERALS) tablet Take 1 tablet by mouth daily. Centrum Silver   mupirocin ointment (BACTROBAN) 2 % Apply 1 application topically daily. Qd  to excision site with dressing changes   Olodaterol HCl (STRIVERDI RESPIMAT) 2.5 MCG/ACT AERS Inhale 2 puffs into the lungs daily. USE AT THE SAME TIME EVERYDAY   predniSONE (DELTASONE) 10 MG tablet Take 40 mg on day 1 and decrease by 10mg  each day -- 40, then 30, then 20, then 10 for a total of 4 days.   Probiotic Product (PROBIOTIC DAILY PO) Take 1 capsule by mouth 2 (two) times a week.    rivaroxaban (XARELTO) 20 MG TABS tablet Take 1 tablet (20 mg total) by mouth as needed (PRN for long car or plane ride).   sacubitril-valsartan (ENTRESTO) 24-26 MG Take 1 tablet by mouth 2 (two) times daily.   Turmeric 500 MG CAPS Take 500 mg by mouth daily.    zinc sulfate 220 (50 Zn) MG capsule Take  1 capsule (220 mg total) by mouth daily.   famotidine (PEPCID) 20 MG tablet Take 1 tablet (20 mg total) by mouth 2 (two) times daily for 10 days.   No facility-administered encounter medications on file as of 04/07/2021.    Allergies (verified) Amoxicillin, Aspirin, Ciprofloxacin, Ciprofloxacin-dexamethasone, Crestor [rosuvastatin], Morphine and related, Doxycycline hyclate, Hydrochlorothiazide, Keflex [cephalexin], Lovastatin, Simvastatin, Erythromycin, Ibuprofen, Neomycin-bacitracin zn-polymyx, Omeprazole, and Tape   History: Past Medical History:  Diagnosis Date   Actinic keratosis    Acute pulmonary embolus ([place]) 01/29/2020   Acute renal failure (ARF) ([place]) 01/31/2020   Acute respiratory failure with hypoxia ([place]) 01/29/2020   Acute septic pulmonary embolism (H[facility])    AKI (acute kidney injury) ([place])    Amaurosis fugax, right eye 12/2018   Arthritis    Asthma    Basal cell carcinoma 07/2016   central forehead (tx in [place])   Cardiomyopathy [facility])    a. 04/2018 Echo: EF 45-50%. Anteroseptal and apical HK in some views. Mild AI/MR. Nl RV fxn; b. 05/2018 MV: mid-dist ant and antsept mild defect - ? breast atten. EF 41%. No ischemia; c. 03/2019 Echo: EF 30-35%, Gr2 DD, Trace MR, triv TR. Asc Ao ectatic dil - 27mm.    GERD (gastroesophageal reflux disease)    Herniated disc, cervical    Hyperlipidemia    Left wrist fracture    Melanoma ([place]) 05/12/2020   Melanoma IS R post neck, excised 08/10/20   Osteoporosis    Pneumonia due to COVID-19 virus 01/26/2020   Stroke ([place]) 1998   SVT (supraventricular tachycardia) ([place])    a.2008 s/p RFCA for AVNRT; b. 9 & 02/2019 Zio x 2: 1. Avg HR 89, 3 runs NSVT (longest 7 beats), [place] 1. 2. Avg HR 89, NSVT x 1 (5 beats), [place]itz 1. No signif arrhythmia.   Past Surgical History:  Procedure Laterality Date   BACK SURGERY  2017   L2-4 laminectomy and foraminal stenosis   BREAST CYST ASPIRATION Left 1977   CARDIAC CATHETERIZATION  2009   [facility] [place]  2009   CATARACT EXTRACTION, BILATERAL  2010   COLONOSCOPY WITH PROPOFOL N/A 03/21/2018   Procedure: COLONOSCOPY WITH Biopsy;  Surgeon: Lin Landsman, MD;  Location: [facility];  Service: Endoscopy;  Laterality: N/A;   derrick procedure  1977-78   bilateral    ESOPHAGOGASTRODUODENOSCOPY  2013   gastritis; done for complaint of dysphagia   ESOPHAGOGASTRODUODENOSCOPY (EGD) WITH PROPOFOL N/A 03/21/2018   Procedure: ESOPHAGOGASTRODUODENOSCOPY (EGD) WITH Biopsies;  Surgeon: Lin Landsman, MD;  Location: [place];  Service: Endoscopy;  Laterality: N/A;  KEEP  THIS PATIENT FIRST   LEFT HEART CATH AND CORONARY ANGIOGRAPHY Left 05/02/2019   Procedure: LEFT HEART CATH AND CORONARY ANGIOGRAPHY;  Surgeon: Minna Merritts, MD;  Location: [facility];  Service: Cardiovascular;  Laterality: Left;   lymph node removal     neck   MOHS SURGERY  07/2016   BCCA nose   POLYPECTOMY N/A 03/21/2018   Procedure: POLYPECTOMY;  Surgeon: Lin Landsman, MD;  Location: [facility];  Service: Endoscopy;  Laterality: N/A;   REPLACEMENT TOTAL KNEE  2013   RT   TOTAL SHOULDER REPLACEMENT  2012   RT    Family History  Problem Relation Age of Onset   Diabetes Mother    Stroke Mother    Valvular heart disease Father    CAD Sister 70       died of MI   Stroke Sister    Stroke Sister 81   Clotting disorder Half-Sister    Clotting disorder Half-Sister    Breast cancer Neg Hx    Social History   Socioeconomic History   Marital status: Widowed    Spouse name: Not on file   Number of children: 3   Years of education: Not on file   Highest education level: Not on file  Occupational History   Occupation: retired  Tobacco Use   Smoking status: Never   Smokeless tobacco: Never  Vaping Use   Vaping Use: Never used  Substance and Sexual Activity   Alcohol use: Not Currently     Comment: may drink 1-2x/yr   Drug use: No   Sexual activity: Never  Other Topics Concern   Not on file  Social History Narrative   ** Merged History Encounter **       Lives at home alone, has resources if help is needed   Social Determinants of Radio broadcast assistant Strain: Low Risk    Difficulty of Paying Living Expenses: Not hard at all  Food Insecurity: No Food Insecurity   Worried About Charity fundraiser in the Last Year: Never true   Arboriculturist in the Last Year: Never true  Transportation Needs: No Transportation Needs   Lack of Transportation (Medical): No   Lack of Transportation (Non-Medical): No  Physical Activity: Insufficiently Active   Days of Exercise per Week: 7 days   Minutes of Exercise per Session: 20 min  Stress: Not on file  Social Connections: Moderately Integrated   Frequency of Communication with Friends and Family: More than three times a week   Frequency of Social Gatherings with Friends and Family: Twice a week   Attends Religious Services: More than 4 times per year   Active Member of [employer] or Organizations: No   Attends Music therapist: More than 4 times per year   Marital Status: Widowed    Tobacco Counseling Counseling given: Not Answered   Clinical Intake:  Pre-visit preparation completed: Yes  Pain : No/denies pain Pain Score: 0-No pain     Nutritional Status: BMI > 30  Obese Nutritional Risks: None Diabetes: No  How often do you need to have someone help you when you read instructions, pamphlets, or other written materials from your doctor or pharmacy?: 1 - Never  Interpreter Needed?: No  Information entered by :: Kirke Shaggy, LPN   Activities of Daily Living In your present state of health, do you have any difficulty performing the following activities: 04/07/2021  Hearing? N  Vision?  N  Difficulty concentrating or making decisions? N  Walking or climbing stairs? N  Dressing or bathing? N   Doing errands, shopping? N  Preparing Food and eating ? N  Using the Toilet? N  In the past six months, have you accidently leaked urine? N  Do you have problems with loss of bowel control? N  Managing your Medications? N  Managing your Finances? N  Housekeeping or managing your Housekeeping? N  Some recent data might be hidden    Patient Care Team: Glean Hess, MD as PCP - General (Internal Medicine) Minna Merritts, MD as PCP - Cardiology (Cardiology) Flora Lipps, MD as Consulting Physician (Pulmonary Disease) Gillis Santa, MD as Consulting Physician (Pain Medicine) Earnestine Leys, MD (Orthopedic Surgery) Minna Merritts, MD as Consulting Physician (Cardiology) Brendolyn Patty, MD (Dermatology)  Indicate any recent Medical Services you may have received from other than [facility] providers in the past year (date may be approximate).     Assessment:   This is a routine wellness examination for Donae.  Hearing/Vision screen Hearing Screening - Comments:: Had it tested a year ago and it was normal per patient Vision Screening - Comments:: [facility] screen[facility] Dr. Edison Pace  Dietary issues and exercise activities discussed: Current Exercise Habits: Home exercise routine, Type of exercise: walking, Time (Minutes): 20, Frequency (Times/Week): 7, Weekly Exercise (Minutes/Week): 140, Intensity: Mild, Exercise limited by: None identified   Goals Addressed   None    Depression Screen PHQ 2/9 Scores 04/07/2021 05/06/2020 04/06/2020 11/25/2019 10/08/2019 01/28/2019 11/15/2018  PHQ - 2 Score 0 0 0 0 0 0 0  PHQ- 9 Score - 0 - 0 0 - -    Fall Risk Fall Risk  04/07/2021 05/06/2020 04/06/2020 11/25/2019 10/08/2019  Falls in the past year? 1 0 0 0 0  Number falls in past yr: 0 - 0 0 0  Injury with Fall? 1 - 0 0 0  Risk for fall due to : History of fall(s) - No Fall Risks No Fall Risks No Fall Risks  Follow up Falls prevention discussed Falls evaluation completed  Falls prevention discussed Falls evaluation completed Falls evaluation completed    [place]:  Any stairs in or around the home? No  If so, are there any without handrails? No  Home free of loose throw rugs in walkways, pet beds, electrical cords, etc? No  Adequate lighting in your home to reduce risk of falls? No   ASSISTIVE DEVICES UTILIZED TO PREVENT FALLS:  Life alert? Yes  Use of a cane, walker or w/c? Yes  Grab bars in the bathroom? No  Shower chair or bench in shower? No  Elevated toilet seat or a handicapped toilet? No   TIMED UP AND GO:  Was the test performed? Yes .  Length of time to ambulate 10 feet: 5 sec.   Gait slow and steady with assistive device  Cognitive Function:Normal cognitive status assessed by direct observation by this Nurse Health Advisor. No abnormalities found.       6CIT Screen 04/06/2020 01/28/2019  What Year? 0 points 0 points  What month? 0 points 0 points  What time? 0 points 0 points  Count back from 20 0 points 0 points  Months in reverse 0 points 2 points  Repeat phrase 0 points 0 points  Total Score 0 2    Immunizations Immunization History  Administered Date(s) Administered   Pneumococcal Conjugate-13 07/22/2015  Pneumococcal Polysaccharide-23 07/22/2013   Td 03/03/2002    TDAP status: Up to date up to date  Flu Vaccine status: Declined, Education has been provided regarding the importance of this vaccine but patient still declined. Advised may receive this vaccine at local pharmacy or Health Dept. Aware to provide a copy of the vaccination record if obtained from local pharmacy or Health Dept. Verbalized acceptance and understanding.declined  Pneumococcal vaccine status: Up to dateup to date  Covid-19 vaccine status: Declined, Education has been provided regarding the importance of this vaccine but patient still declined. Advised may receive this vaccine at local pharmacy or Health Dept.or  vaccine clinic. Aware to provide a copy of the vaccination record if obtained from local pharmacy or Health Dept. Verbalized acceptance and understanding.declined  Qualifies for Shingles Vaccine? Yes   Zostavax completed No   Shingrix Completed?: No.    Education has been provided regarding the importance of this vaccine. Patient has been advised to call insurance company to determine out of pocket expense if they have not yet received this vaccine. Advised may also receive vaccine at local pharmacy or Health Dept. Verbalized acceptance and understanding.  Screening Tests Health Maintenance  Topic Date Due   COVID-19 Vaccine (1) Never done   Zoster Vaccines- Shingrix (1 of 2) Never done   TETANUS/TDAP  03/03/2012   MAMMOGRAM  06/03/2020   INFLUENZA VACCINE  Never done   COLONOSCOPY (Pts 45-45yrs Insurance coverage will need to be confirmed)  03/21/2021   Pneumonia Vaccine 79+ Years old  Completed   DEXA SCAN  Completed   Hepatitis C Screening  Completed   HPV VACCINES  Aged Out    Health Maintenance  Health Maintenance Due  Topic Date Due   COVID-19 Vaccine (1) Never done   Zoster Vaccines- Shingrix (1 of 2) Never done   TETANUS/TDAP  03/03/2012   MAMMOGRAM  06/03/2020   INFLUENZA VACCINE  Never done   COLONOSCOPY (Pts 45-28yrs Insurance coverage will need to be confirmed)  03/21/2021    Colorectal cancer screening: Type of screening: Colonoscopy. Completed 03/21/18. Repeat every 10 yearsup to date  Mammogram status: Completed 06/04/19. Repeat every yearup to date  Bone Density status: Ordered 02/26/19. Pt provided with contact info and advised to call to schedule appt.due  Lung Cancer Screening: (Low Dose CT Chest recommended if Age 13-80 years, 30 pack-year currently smoking OR have quit w/in 15years.) does not qualify.    Additional Screening:  Hepatitis C Screening: does qualify; Completed 11/15/18  Vision Screening: Recommended annual ophthalmology exams for early  detection of glaucoma and other disorders of the eye. Is the patient up to date with their annual eye exam?  Yes  Who is the provider or what is the name of the office in which the patient attends annual eye exams? [facility] .   Dental Screening: Recommended annual dental exams for proper oral hygiene  Community Resource Referral / Chronic Care Management: CRR required this visit?  No   CCM required this visit?  No      Plan:     I have personally reviewed and noted the following in the patient's chart:   Medical and social history Use of alcohol, tobacco or illicit drugs  Current medications and supplements including opioid prescriptions.  Functional ability and status Nutritional status Physical activity Advanced directives List of other physicians Hospitalizations, surgeries, and ER visits in previous 12 months Vitals Screenings to include cognitive, depression, and falls Referrals and appointments  In  addition, I have reviewed and discussed with patient certain preventive protocols, quality metrics, and best practice recommendations. A written personalized care plan for preventive services as well as general preventive health recommendations were provided to patient.     Dionisio David   04/07/2021   Nurse Notes:

## 2021-04-07 NOTE — Patient Instructions (Signed)
Audrey Martinez , Thank you for taking time to come for your Medicare Wellness Visit. I appreciate your ongoing commitment to your health goals. Please review the following plan we discussed and let me know if I can assist you in the future.   Screening recommendations/referrals: Colonoscopy: 03/21/18 Mammogram: 06/04/19 Bone Density: 02/26/19 Recommended yearly ophthalmology/optometry visit for glaucoma screening and checkup Recommended yearly dental visit for hygiene and checkup  Vaccinations: Influenza vaccine: declined Pneumococcal vaccine: 07/22/15 Tdap vaccine: 03/03/2022 Shingles vaccine: declined     Covid-19:declined   Advanced directives: has them, requested to bring copy to office   Conditions/risks identified: none  Next appointment: Follow up in one year for your annual wellness visit    Preventive Care 65 Years and Older, Female Preventive care refers to lifestyle choices and visits with your health care provider that can promote health and wellness. What does preventive care include? A yearly physical exam. This is also called an annual well check. Dental exams once or twice a year. Routine eye exams. Ask your health care provider how often you should have your eyes checked. Personal lifestyle choices, including: Daily care of your teeth and gums. Regular physical activity. Eating a healthy diet. Avoiding tobacco and drug use. Limiting alcohol use. Practicing safe sex. Taking low-dose aspirin every day. Taking vitamin and mineral supplements as recommended by your health care provider. What happens during an annual well check? The services and screenings done by your health care provider during your annual well check will depend on your age, overall health, lifestyle risk factors, and family history of disease. Counseling  Your health care provider may ask you questions about your: Alcohol use. Tobacco use. Drug use. Emotional well-being. Home and relationship  well-being. Sexual activity. Eating habits. History of falls. Memory and ability to understand (cognition). Work and work Statistician. Reproductive health. Screening  You may have the following tests or measurements: Height, weight, and BMI. Blood pressure. Lipid and cholesterol levels. These may be checked every 5 years, or more frequently if you are over 52 years old. Skin check. Lung cancer screening. You may have this screening every year starting at age 24 if you have a 30-pack-year history of smoking and currently smoke or have quit within the past 15 years. Fecal occult blood test (FOBT) of the stool. You may have this test every year starting at age 58. Flexible sigmoidoscopy or colonoscopy. You may have a sigmoidoscopy every 5 years or a colonoscopy every 10 years starting at age 72. Hepatitis C blood test. Hepatitis B blood test. Sexually transmitted disease (STD) testing. Diabetes screening. This is done by checking your blood sugar (glucose) after you have not eaten for a while (fasting). You may have this done every 1-3 years. Bone density scan. This is done to screen for osteoporosis. You may have this done starting at age 5. Mammogram. This may be done every 1-2 years. Talk to your health care provider about how often you should have regular mammograms. Talk with your health care provider about your test results, treatment options, and if necessary, the need for more tests. Vaccines  Your health care provider may recommend certain vaccines, such as: Influenza vaccine. This is recommended every year. Tetanus, diphtheria, and acellular pertussis (Tdap, Td) vaccine. You may need a Td booster every 10 years. Zoster vaccine. You may need this after age 72. Pneumococcal 13-valent conjugate (PCV13) vaccine. One dose is recommended after age 42. Pneumococcal polysaccharide (PPSV23) vaccine. One dose is recommended after age 73. Talk  to your health care provider about which  screenings and vaccines you need and how often you need them. This information is not intended to replace advice given to you by your health care provider. Make sure you discuss any questions you have with your health care provider. Document Released: 06/12/2015 Document Revised: 02/03/2016 Document Reviewed: 03/17/2015 Elsevier Interactive Patient Education  2017 [place] Prevention in the Home Falls can cause injuries. They can happen to people of all ages. There are many things you can do to make your home safe and to help prevent falls. What can I do on the outside of my home? Regularly fix the edges of walkways and driveways and fix any cracks. Remove anything that might make you trip as you walk through a door, such as a raised step or threshold. Trim any bushes or trees on the path to your home. Use bright outdoor lighting. Clear any walking paths of anything that might make someone trip, such as rocks or tools. Regularly check to see if handrails are loose or broken. Make sure that both sides of any steps have handrails. Any raised decks and porches should have guardrails on the edges. Have any leaves, snow, or ice cleared regularly. Use sand or salt on walking paths during winter. Clean up any spills in your garage right away. This includes oil or grease spills. What can I do in the bathroom? Use night lights. Install grab bars by the toilet and in the tub and shower. Do not use towel bars as grab bars. Use non-skid mats or decals in the tub or shower. If you need to sit down in the shower, use a plastic, non-slip stool. Keep the floor dry. Clean up any water that spills on the floor as soon as it happens. Remove soap buildup in the tub or shower regularly. Attach bath mats securely with double-sided non-slip rug tape. Do not have throw rugs and other things on the floor that can make you trip. What can I do in the bedroom? Use night lights. Make sure that you have a  light by your bed that is easy to reach. Do not use any sheets or blankets that are too big for your bed. They should not hang down onto the floor. Have a firm chair that has side arms. You can use this for support while you get dressed. Do not have throw rugs and other things on the floor that can make you trip. What can I do in the kitchen? Clean up any spills right away. Avoid walking on wet floors. Keep items that you use a lot in easy-to-reach places. If you need to reach something above you, use a strong step stool that has a grab bar. Keep electrical cords out of the way. Do not use floor polish or wax that makes floors slippery. If you must use wax, use non-skid floor wax. Do not have throw rugs and other things on the floor that can make you trip. What can I do with my stairs? Do not leave any items on the stairs. Make sure that there are handrails on both sides of the stairs and use them. Fix handrails that are broken or loose. Make sure that handrails are as long as the stairways. Check any carpeting to make sure that it is firmly attached to the stairs. Fix any carpet that is loose or worn. Avoid having throw rugs at the top or bottom of the stairs. If you do have throw rugs,  attach them to the floor with carpet tape. Make sure that you have a light switch at the top of the stairs and the bottom of the stairs. If you do not have them, ask someone to add them for you. What else can I do to help prevent falls? Wear shoes that: Do not have high heels. Have rubber bottoms. Are comfortable and fit you well. Are closed at the toe. Do not wear sandals. If you use a stepladder: Make sure that it is fully opened. Do not climb a closed stepladder. Make sure that both sides of the stepladder are locked into place. Ask someone to hold it for you, if possible. Clearly mark and make sure that you can see: Any grab bars or handrails. First and last steps. Where the edge of each step  is. Use tools that help you move around (mobility aids) if they are needed. These include: Canes. Walkers. Scooters. Crutches. Turn on the lights when you go into a dark area. Replace any light bulbs as soon as they burn out. Set up your furniture so you have a clear path. Avoid moving your furniture around. If any of your floors are uneven, fix them. If there are any pets around you, be aware of where they are. Review your medicines with your doctor. Some medicines can make you feel dizzy. This can increase your chance of falling. Ask your doctor what other things that you can do to help prevent falls. This information is not intended to replace advice given to you by your health care provider. Make sure you discuss any questions you have with your health care provider. Document Released: 03/12/2009 Document Revised: 10/22/2015 Document Reviewed: 06/20/2014 Elsevier Interactive Patient Education  2017 [employer].

## 2021-04-08 DIAGNOSIS — Z961 Presence of intraocular lens: Secondary | ICD-10-CM | POA: Diagnosis not present

## 2021-04-11 ENCOUNTER — Other Ambulatory Visit: Payer: Self-pay | Admitting: Internal Medicine

## 2021-04-11 ENCOUNTER — Other Ambulatory Visit: Payer: Self-pay | Admitting: Cardiovascular Disease

## 2021-04-12 NOTE — Telephone Encounter (Signed)
Requested medication (s) are due for refill today - no  Requested medication (s) are on the active medication list -yes  Future visit scheduled -yes  Last refill: 03/22/21 #270  Notes to clinic: Request RF: non delegated Rx  Requested Prescriptions  Pending Prescriptions Disp Refills   baclofen (LIORESAL) 10 MG tablet [Pharmacy Med Name: BACLOFEN 10 MG TABLET] 270 tablet 0    Sig: TAKE 1 TABLET BY MOUTH THREE TIMES A DAY     Not Delegated - Analgesics:  Muscle Relaxants Failed - 04/11/2021  1:18 PM      Failed - This refill cannot be delegated      Failed - Valid encounter within last 6 months    Recent Outpatient Visits           11 months ago Essential hypertension   [facility] Glean Hess, MD   1 year ago Diarrhea, unspecified type   [facility] Glean Hess, MD   1 year ago Essential hypertension   [facility] Glean Hess, MD   1 year ago Pain of right thigh   [facility] Glean Hess, MD   2 years ago Essential hypertension   [place], Laura H, MD       Future Appointments             In 4 weeks Gollan, Kathlene November, MD [facility]   In 2 months Army Melia, Jesse Sans, MD [facility] [place]   In 4 months Brendolyn Patty, MD [place]               Requested Prescriptions  Pending Prescriptions Disp Refills   baclofen (LIORESAL) 10 MG tablet [Pharmacy Med Name: BACLOFEN 10 MG TABLET] 270 tablet 0    Sig: TAKE 1 TABLET BY MOUTH THREE TIMES A DAY     Not Delegated - Analgesics:  Muscle Relaxants Failed - 04/11/2021  1:18 PM      Failed - This refill cannot be delegated      Failed - Valid encounter within last 6 months    Recent Outpatient Visits           11 months ago Essential hypertension   [facility] Glean Hess, MD   1 year ago Diarrhea, unspecified type   [facility] Glean Hess, MD   1  year ago Essential hypertension   [facility] Glean Hess, MD   1 year ago Pain of right thigh   [facility] Glean Hess, MD   2 years ago Essential hypertension   [facility] Glean Hess, MD       Future Appointments             In 4 weeks Gollan, Kathlene November, MD [facility]   In 2 months Army Melia, Jesse Sans, MD [facility] [place]   In 4 months Brendolyn Patty, MD [place]

## 2021-04-14 ENCOUNTER — Telehealth: Payer: Self-pay | Admitting: Cardiovascular Disease

## 2021-04-14 NOTE — Telephone Encounter (Signed)
Was able to return call to Audrey Martinez, pt concern that when she went to get her refill for losartan, pharmacy would not refill script. Advised pt that at last OV, Dr Rockey Situ had mention stopping Losartan and replacing it with Entresto. Pt reports "I vaguely remember", advised to refer to AVS handed to her at last OV. Stated she has not taken med in a while, but couldn't remember why she stopped, but went to get refill and pharmacy advised could not refill, pt reports she was confused.    Per Dr. Donivan Scull OV notes 02/19/2021 Cardiomyopathy, dilated Nonischemic Ejection fraction 35% Continue Coreg, will change losartan to Entresto 24/26 mg twice daily  AVS handed to pt at OV Please STOP Losartan   Please START Entresto 24/26 mg twice a day  Audrey Martinez verbalized understanding, reports she is thankful for calling back and explaining medication. Will call back with any further concerns or questions.

## 2021-04-14 NOTE — Telephone Encounter (Signed)
Pt c/o medication issue:  1. Name of Medication: losartan   2. How are you currently taking this medication (dosage and times per day)? Out and pharmacy would not refill   3. Are you having a reaction (difficulty breathing--STAT)? No   4. What is your medication issue? Patient unable to get losartan refill and doesn't recall med changes at last visit ( noted changing to entresto )

## 2021-04-15 ENCOUNTER — Other Ambulatory Visit: Payer: Self-pay

## 2021-04-15 ENCOUNTER — Ambulatory Visit: Payer: Medicare Other | Attending: Otolaryngology | Admitting: Speech Pathology

## 2021-04-15 DIAGNOSIS — R490 Dysphonia: Secondary | ICD-10-CM | POA: Diagnosis not present

## 2021-04-16 NOTE — Therapy (Signed)
[place] MAIN [facility] SERVICES [address] [place], [place], [address] Phone: [phone]   Fax:  [phone]  Speech Language Pathology Evaluation  Patient Details  Name: Audrey Martinez MRN: [id number] Date of Birth: 1946-04-09 Referring Provider (SLP): Carloyn Manner   Encounter Date: 04/15/2021   End of Session - 04/16/21 1546     Visit Number 1    Number of Visits 8    Date for SLP Re-Evaluation 05/14/21    Authorization Type Medicare    Authorization Time Period 04/15/2021 thru 05/14/2021    Authorization - Visit Number 1    Progress Note Due on Visit 10    SLP Start Time 0900    SLP Stop Time  1000    SLP Time Calculation (min) 60 min    Activity Tolerance Patient tolerated treatment well             Past Medical History:  Diagnosis Date   Actinic keratosis    Acute pulmonary embolus ([place]) 01/29/2020   Acute renal failure (ARF) ([place]) 01/31/2020   Acute respiratory failure with hypoxia ([place]) 01/29/2020   Acute septic pulmonary embolism ([facility])    AKI (acute kidney injury) ([place])    Amaurosis fugax, right eye 12/2018   Arthritis    Asthma    Basal cell carcinoma 07/2016   central forehead (tx in [place])   Cardiomyopathy [facility])    a. 04/2018 Echo: EF 45-50%. Anteroseptal and apical HK in some views. Mild AI/MR. Nl RV fxn; b. 05/2018 MV: mid-dist ant and antsept mild defect - ? breast atten. EF 41%. No ischemia; c. 03/2019 Echo: EF 30-35%, Gr2 DD, Trace MR, triv TR. Asc Ao ectatic dil - 94mm.    GERD (gastroesophageal reflux disease)    Herniated disc, cervical    Hyperlipidemia    Left wrist fracture    Melanoma ([place]) 05/12/2020   Melanoma IS R post neck, excised 08/10/20   Osteoporosis    Pneumonia due to COVID-19 virus 01/26/2020   Stroke ([place]) 1998   SVT (supraventricular tachycardia) ([place])    a.2008 s/p RFCA for AVNRT; b. 9 & 02/2019 Zio x 2: 1. Avg HR 89, 3 runs NSVT (longest 7 beats), Mobitz 1. 2. Avg HR 89,  NSVT x 1 (5 beats), Mobitz 1. No signif arrhythmia.    Past Surgical History:  Procedure Laterality Date   BACK SURGERY  2017   L2-4 laminectomy and foraminal stenosis   BREAST CYST ASPIRATION Left 1977   CARDIAC CATHETERIZATION  2009   [facility] [place]  2009   CATARACT EXTRACTION, BILATERAL  2010   COLONOSCOPY WITH PROPOFOL N/A 03/21/2018   Procedure: COLONOSCOPY WITH Biopsy;  Surgeon: Lin Landsman, MD;  Location: [place];  Service: Endoscopy;  Laterality: N/A;   derrick procedure  1977-78   bilateral    ESOPHAGOGASTRODUODENOSCOPY  2013   gastritis; done for complaint of dysphagia   ESOPHAGOGASTRODUODENOSCOPY (EGD) WITH PROPOFOL N/A 03/21/2018   Procedure: ESOPHAGOGASTRODUODENOSCOPY (EGD) WITH Biopsies;  Surgeon: Lin Landsman, MD;  Location: [place];  Service: Endoscopy;  Laterality: N/A;  KEEP THIS PATIENT FIRST   LEFT HEART CATH AND CORONARY ANGIOGRAPHY Left 05/02/2019   Procedure: LEFT HEART CATH AND CORONARY ANGIOGRAPHY;  Surgeon: Minna Merritts, MD;  Location: [facility];  Service: Cardiovascular;  Laterality: Left;   lymph node removal     neck   MOHS SURGERY  07/2016   BCCA nose   POLYPECTOMY N/A 03/21/2018   Procedure: POLYPECTOMY;  Surgeon: Lin Landsman, MD;  Location: [place];  Service: Endoscopy;  Laterality: N/A;   REPLACEMENT TOTAL KNEE  2013   RT   TOTAL SHOULDER REPLACEMENT  2012   RT     There were no vitals filed for this visit.   Subjective Assessment - 04/16/21 1543     Subjective "I didn't use my nose spray this morning"    Currently in Pain? No/denies                SLP Evaluation [facility] - 04/16/21 1543       SLP Visit Information   SLP Received On 04/15/21    Referring Provider (SLP) Carloyn Manner    Onset Date 03/10/2021    Medical Diagnosis Dysphonia      General Information   HPI Pt is a 75  year old female referred for Voice Evaluation by her ENT Dr Carloyn Manner for dysphonia. Pt also has Baseline h/o COPD and asthma; CVA in 1998, TIA.    Behavioral/Cognition Appropriate    Mobility Status ambulatory      Balance Screen   Has the patient fallen in the past 6 months No    Has the patient had a decrease in activity level because of a fear of falling?  No    Is the patient reluctant to leave their home because of a fear of falling?  No      Prior Functional Status   Cognitive/Linguistic Baseline Within functional limits      Oral Motor/Sensory Function   Overall Oral Motor/Sensory Function Appears within functional limits for tasks assessed      Motor Speech   Overall Motor Speech Impaired    Respiration Impaired    Level of Impairment Conversation    Phonation Breathy;Hoarse;Low vocal intensity    Resonance Within functional limits    Articulation Within functional limitis    Motor Planning Witnin functional limits    Motor Speech Errors Not applicable    Phonation Impaired    Volume Appropriate    Pitch Low      Standardized Assessments   Standardized Assessments  --   Perceptual Voice Evaluation             SLP Education - 04/16/21 1546     Education Details Reflux, compliance with allergy spray, results of this assessment, ST POC    Person(s) Educated Patient    Methods Explanation;Demonstration;Verbal cues;Handout    Comprehension Verbalized understanding;Need further instruction                SLP Long Term Goals - 04/16/21 1549       SLP LONG TERM GOAL #1   Title The patient will demonstrate independent understanding of vocal hygiene concepts and extrinsic laryngeal muscle stretches.      Time 4    Period Weeks    Status New    Target Date 05/14/21      SLP LONG TERM GOAL #2   Title The patient will be independent for abdominal breathing and breath support exercises.    Time 4    Period Weeks    Status New    Target Date  05/14/21              Plan - 04/16/21 1547     Clinical Impression Statement Pt with history of dysphonia. In May 2020 pt completed course of Voice  Therapy targeting moderate dysphonia d/t muscle tension dysphonia as evidenced by "abnormal laryngeal findings including erythema, cobblestoning, edema and bilateral muscle tension dysphonia with touching of the false vocal folds with phonation."              More recently (03/09/2021) pt had a Modified Barium Swallow Study d/t globus sensation. The study revealed that "most liquid trials triggered the pharyngeal swallow at the level of the Vallecula w/ slight spilling from the Vallecula to the Pyriform Sinuses. Suspect this delay could be attributed to pt's baseline REFLUX and its impact on the pharynx(tissue of). Such a presentation can be seen in pts w/ REFLUX. A PPI was recommended in 2019 when an EGD found 'esophageal mucosal changes suspicious for eosinophilic esophagitis. Pt stated she is NO LONGER taking this PPI but is intermittently taking 'Gaviscon when needed.' She continues to report reduced vocal quality. Esophageal phase noted "slight/transient bolus stasis noted w/ increased viscosity."             During pt's recent ENT visit on 03/10/2021 pt was placed on Atrovent d/t chronic rhinitis. She reports that her vocal quality is much improved when using the nasal spray. Of note, pt forgot to use the spray this morning and reports that her voice is noticably worse today as a result.     Pt demonstrates hoarse/raspy vocal quality, reduced breath control for speech, low habitual pitch.   Perceptual Voice Evaluation   Voice checklist:   ? Health risks: GERD, allergies    ? Characteristic voice use: lives alone, has 2 pets that she provides direction for   ? Environmental risks: none identified   ? Misuse: none identified   ? Abuse: talking without talking allergy medicine   ? Vocal characteristics: hoarse/raspy, low habitual pitch    Maximum phonation time for sustained "ah": 10 seconds   Average fundamental frequency during sustained "ah": 182 Hz (2 STD below average for gender) - pt states that her pitch has always been low at baseline   Habitual pitch: 200 Hz   Highest dynamic pitch when altering pitch from a low note to a high note: 290 Hz   Lowest dynamic pitch when altering from a high note to a low note:137 Hz   Highest dynamic pitch in conversational speech: 295 Hz   Lowest dynamic pitch in conversational speech: 75 Hz   Average time patient was able to sustain /s/: 15.3 seconds   Average time patient was able to sustain /z/: 16 seconds   s/z ratio : .9       VHI Score:  The Voice Handicap Index is comprised of a series of questions to assess the patient's perception of their voice. It is designed to evaluate the emotional, physical and functional components of the voice problem. ?   Functional: 14 (mild)   Physical: 25 (severe)   Emotional: 14 (mild)   Total:  (Normal mean 8.75, SD =14.97) z score =  (2.8) moderate         I recommend skilled ST to train pt in vocal hygiene, improve breath support for voice and reduce laryngeal tension in order to improve vocal quality, endurance, and quality of life.    Speech Therapy Frequency 2x / week    Duration 4 weeks    Treatment/Interventions SLP instruction and feedback;Patient/family education    Potential to Achieve Goals Good    Consulted and Agree with Plan of Care Patient  Patient will benefit from skilled therapeutic intervention in order to improve the following deficits and impairments:   Dysphonia    Problem List Patient Active Problem List   Diagnosis Date Noted   Pulmonary embolus ([place]) 01/31/2020   Atrial flutter ([place]) 01/31/2020   Obesity (BMI 30-39.9) 01/31/2020   Left lower lobe pulmonary nodule 01/29/2020   Chronic obstructive pulmonary disease, unspecified COPD type ([place]) 10/08/2019   Dilated  cardiomyopathy ([place]) 05/02/2019   Amaurosis fugax of right eye 01/13/2019   Arm pain, right 11/08/2017   Renal insufficiency 11/08/2017   Tendinitis of right knee 07/03/2017   Lumbar radiculopathy 05/18/2017   Status post hemilaminotomy 05/18/2017   Lumbar degenerative disc disease 05/18/2017   Spinal stenosis of lumbar region without neurogenic claudication 05/18/2017   Atrial tachycardia ([place]) 04/14/2017   SVT (supraventricular tachycardia) ([place]) 04/14/2017   Essential hypertension 04/14/2017   Intermittent asthma without complication 90/21/1155   Osteoporosis 03/21/2017   Benign essential tremor 03/21/2017   Hx of atrioventricular node ablation 02/01/2008   Hyperlipidemia 05/12/2003   Personal history of transient ischemic attack (TIA), and cerebral infarction without residual deficits 04/22/2003   Sonia Stickels B. Rutherford Nail M.S., CCC-SLP, [place] Pathologist Rehabilitation Services Office [phone]  Stormy Fabian 04/16/2021, 3:51 PM  [place] MAIN [facility] SERVICES [address] [place], [place], [address] Phone: [phone]   Fax:  [phone]  Name: Alveda Vanhorne MRN: [id number] Date of Birth: [birth year]

## 2021-04-20 ENCOUNTER — Ambulatory Visit: Payer: Medicare Other | Admitting: Speech Pathology

## 2021-04-20 ENCOUNTER — Other Ambulatory Visit: Payer: Self-pay

## 2021-04-20 DIAGNOSIS — R490 Dysphonia: Secondary | ICD-10-CM

## 2021-04-20 NOTE — Therapy (Signed)
[place] MAIN [facility] SERVICES [address] [place], [place], [address] Phone: [phone]   Fax:  [phone]  Speech Language Pathology Treatment  Patient Details  Name: Audrey Martinez MRN: [id number] Date of Birth: March 21, 1946 Referring Provider (SLP): Carloyn Manner   Encounter Date: 04/20/2021   End of Session - 04/20/21 1207     Visit Number 2    Number of Visits 8    Date for SLP Re-Evaluation 05/14/21    Authorization Type Medicare    Authorization Time Period 04/15/2021 thru 05/14/2021    Authorization - Visit Number 1    Progress Note Due on Visit 10    SLP Start Time 1000    SLP Stop Time  1100    SLP Time Calculation (min) 60 min    Activity Tolerance Patient tolerated treatment well             Past Medical History:  Diagnosis Date   Actinic keratosis    Acute pulmonary embolus ([place]) 01/29/2020   Acute renal failure (ARF) ([place]) 01/31/2020   Acute respiratory failure with hypoxia ([place]) 01/29/2020   Acute septic pulmonary embolism (H[facility])    AKI (acute kidney injury) ([place])    Amaurosis fugax, right eye 12/2018   Arthritis    Asthma    Basal cell carcinoma 07/2016   central forehead (tx in [place])   Cardiomyopathy [facility])    a. 04/2018 Echo: EF 45-50%. Anteroseptal and apical HK in some views. Mild AI/MR. Nl RV fxn; b. 05/2018 MV: mid-dist ant and antsept mild defect - ? breast atten. EF 41%. No ischemia; c. 03/2019 Echo: EF 30-35%, Gr2 DD, Trace MR, triv TR. Asc Ao ectatic dil - 4mm.    GERD (gastroesophageal reflux disease)    Herniated disc, cervical    Hyperlipidemia    Left wrist fracture    Melanoma ([place]) 05/12/2020   Melanoma IS R post neck, excised 08/10/20   Osteoporosis    Pneumonia due to COVID-19 virus 01/26/2020   Stroke ([place]) 1998   SVT (supraventricular tachycardia) ([place])    a.2008 s/p RFCA for AVNRT; b. 9 & 02/2019 Zio x 2: 1. Avg HR 89, 3 runs NSVT (longest 7 beats), Mobitz 1. 2. Avg HR 89,  NSVT x 1 (5 beats), Mobitz 1. No signif arrhythmia.    Past Surgical History:  Procedure Laterality Date   BACK SURGERY  2017   L2-4 laminectomy and foraminal stenosis   BREAST CYST ASPIRATION Left 1977   CARDIAC CATHETERIZATION  2009   [facility] in [place]  2009   CATARACT EXTRACTION, BILATERAL  2010   COLONOSCOPY WITH PROPOFOL N/A 03/21/2018   Procedure: COLONOSCOPY WITH Biopsy;  Surgeon: Lin Landsman, MD;  Location: [place];  Service: Endoscopy;  Laterality: N/A;   derrick procedure  1977-78   bilateral    ESOPHAGOGASTRODUODENOSCOPY  2013   gastritis; done for complaint of dysphagia   ESOPHAGOGASTRODUODENOSCOPY (EGD) WITH PROPOFOL N/A 03/21/2018   Procedure: ESOPHAGOGASTRODUODENOSCOPY (EGD) WITH Biopsies;  Surgeon: Lin Landsman, MD;  Location: [place];  Service: Endoscopy;  Laterality: N/A;  KEEP THIS PATIENT FIRST   LEFT HEART CATH AND CORONARY ANGIOGRAPHY Left 05/02/2019   Procedure: LEFT HEART CATH AND CORONARY ANGIOGRAPHY;  Surgeon: Minna Merritts, MD;  Location: [place] CV LAB;  Service: Cardiovascular;  Laterality: Left;   lymph node removal     neck   MOHS SURGERY  07/2016   BCCA nose   POLYPECTOMY N/A 03/21/2018   Procedure: POLYPECTOMY;  Surgeon: Lin Landsman, MD;  Location: [place];  Service: Endoscopy;  Laterality: N/A;   REPLACEMENT TOTAL KNEE  2013   RT   TOTAL SHOULDER REPLACEMENT  2012   RT     There were no vitals filed for this visit.   Subjective Assessment - 04/20/21 1149     Subjective Reports using her nasal spray    Currently in Pain? No/denies                   ADULT SLP TREATMENT - 04/20/21 1150       General Information   Behavior/Cognition Alert;Cooperative    HPI Pt is a 75 year old female referred for Voice Evaluation by her ENT Dr Carloyn Manner for dysphonia. Pt also has Baseline h/o COPD and  asthma; CVA in 1998,      Treatment Provided   Treatment provided Cognitive-Linquistic      Pain Assessment   Pain Assessment No/denies pain      Cognitive-Linquistic Treatment   Treatment focused on Voice;Patient/family/caregiver education    Skilled Treatment Pt clearing throat repeatedly walking back to ST room and several times within the first few minutes of sitting down. Patient did not initially recall targets of voice treatment in 2020; provided written and verbal education re: vocal hygiene. Reviewed throat clearing and encouraged pt to sip water or dry swallow instead. Subsequently only 2 instances of throat clearing during remainder of session. Education on use of confidential voice vs excessive loudness or whispering. Trained in relaxation and abdominal breathing, with usual mod cues, fading to mod I. Hoarse, strained vocal quality with sustained phonation (/ma/), so utilized semi-occluded vocal tract exercises to reduce tension and promote forward voice placement. Pt had success with straw hum 19/20 trials (cues for shortened duration). Subsequently able to generate hum without straw and forward voice placement 80% accuracy. Provided written instructions on abdominal breathing, straw phonation and hum for home practice.      Assessment / Recommendations / Plan   Plan Continue with current plan of care      Progression Toward Goals   Progression toward goals Progressing toward goals              SLP Education - 04/20/21 1206     Education Details oral resonance, abdominal breathing, vocal hygiene    Person(s) Educated Patient    Methods Explanation    Comprehension Verbalized understanding                SLP Long Term Goals - 04/16/21 1549       SLP LONG TERM GOAL #1   Title The patient will demonstrate independent understanding of vocal hygiene concepts and extrinsic laryngeal muscle stretches.      Time 4    Period Weeks    Status New    Target Date  05/14/21      SLP LONG TERM GOAL #2   Title The patient will be independent for abdominal breathing and breath support exercises.    Time 4    Period Weeks    Status New    Target Date 05/14/21              Plan - 04/20/21 1207     Clinical Impression Statement Patient presents with moderate dysphonia characterized by hoarse/raspy vocal quality, reduced breath support, low pitch. Patient stimulable for improved vocal quality in simple  phonation tasks using abdominal breathing, SOVTE, and resonant voice techniques. Demonstrated phonotraumatic behaviors of excessive throat clearing, reduced to 2 times once education provided on vocal hygiene provided. Continue skilled ST to train pt in vocal hygiene, improve breath support for voice and reduce laryngeal tension in order to improve vocal quality, endurance, and quality of life.    Speech Therapy Frequency 2x / week    Duration 4 weeks    Treatment/Interventions SLP instruction and feedback;Patient/family education    Potential to Achieve Goals Good    Consulted and Agree with Plan of Care Patient             Patient will benefit from skilled therapeutic intervention in order to improve the following deficits and impairments:   Dysphonia    Problem List Patient Active Problem List   Diagnosis Date Noted   Pulmonary embolus ([place]) 01/31/2020   Atrial flutter ([place]) 01/31/2020   Obesity (BMI 30-39.9) 01/31/2020   Left lower lobe pulmonary nodule 01/29/2020   Chronic obstructive pulmonary disease, unspecified COPD type ([place]) 10/08/2019   Dilated cardiomyopathy ([place]) 05/02/2019   Amaurosis fugax of right eye 01/13/2019   Arm pain, right 11/08/2017   Renal insufficiency 11/08/2017   Tendinitis of right knee 07/03/2017   Lumbar radiculopathy 05/18/2017   Status post hemilaminotomy 05/18/2017   Lumbar degenerative disc disease 05/18/2017   Spinal stenosis of lumbar region without neurogenic claudication 05/18/2017   Atrial  tachycardia ([place]) 04/14/2017   SVT (supraventricular tachycardia) ([place]) 04/14/2017   Essential hypertension 04/14/2017   Intermittent asthma without complication 49/70/2637   Osteoporosis 03/21/2017   Benign essential tremor 03/21/2017   Hx of atrioventricular node ablation 02/01/2008   Hyperlipidemia 05/12/2003   Personal history of transient ischemic attack (TIA), and cerebral infarction without residual deficits 04/22/2003   Deneise Lever, [place], [place], [place] 04/20/2021, 12:10 PM  [place] [address] [place], [place], [address] Phone: [phone]   Fax:  [phone]   Name: Annalysse Shoemaker MRN: [id number] Date of Birth: 05-29-1946

## 2021-04-20 NOTE — Patient Instructions (Addendum)
Start with Abdominal breathing. Breathe in and feel your belly expand, like filling a balloon. As you exhale, feel the balloon deflate. Keep your shoulders and chest nice and relaxed. You can try this lying down if that helps.   Straw Phonation  This allow your vocal folds to vibrate without excess tension and promotes high (forward) placement of the voice  Use as a warm up before prolonged speaking and vocal exercises   High resistance: voicing through a stirring straw  Medium resistance: voicing through a drinking straw  Less resistance: Voiced /v/                            Lip or Tongue Trill                            Nasal "hums" /m/ and /n/                            Vowels /u/ and ee  Try a simple hum or "whoooooo" through the straw. Use an abdominal breath before you begin. The voice should feel smooth and gentle. Stop before it gets hoarse. Repeat several times (10 times) until you are able to do this consistently. If that's going well, you can try a hum without the straw. Feel the buzzing on your lips. If it sounds hoarse or gravelly, stop.

## 2021-04-26 ENCOUNTER — Encounter: Payer: Self-pay | Admitting: Gastroenterology

## 2021-04-26 ENCOUNTER — Other Ambulatory Visit: Payer: Self-pay

## 2021-04-26 ENCOUNTER — Ambulatory Visit (INDEPENDENT_AMBULATORY_CARE_PROVIDER_SITE_OTHER): Payer: Medicare Other | Admitting: Gastroenterology

## 2021-04-26 VITALS — BP 135/79 | HR 95 | Temp 97.9°F | Ht 66.0 in | Wt 245.4 lb

## 2021-04-26 DIAGNOSIS — R1314 Dysphagia, pharyngoesophageal phase: Secondary | ICD-10-CM

## 2021-04-26 DIAGNOSIS — K625 Hemorrhage of anus and rectum: Secondary | ICD-10-CM | POA: Diagnosis not present

## 2021-04-26 DIAGNOSIS — Z86010 Personal history of colonic polyps: Secondary | ICD-10-CM

## 2021-04-26 MED ORDER — CLENPIQ 10-3.5-12 MG-GM -GM/160ML PO SOLN: 320.0000 mL | ORAL | 0 refills | Status: DC

## 2021-04-26 NOTE — Progress Notes (Signed)
Primary Care Physician: Glean Hess, MD  Primary Gastroenterologist:  Dr. Lucilla Lame  Chief Complaint  Patient presents with   New Patient (Initial Visit)    GERD     HPI: Audrey Martinez is a 75 y.o. female here for follow-up after being seen by ENT.  The patient had been seen by Dr. Marius Ditch in the past, please refer to her last note, which reviews all of her previous work-ups including previous procedures.  The patient had been tried on a PPI but had a cardiac arrhythmia associated with a PPI and now reports that although she was following up with ENT for dysphonia she is now having dysphagia.  The upper endoscopy back in 2019 did not show any signs of eosinophilic esophagitis on biopsies, She had a swallowing evaluation that showed:  IMPRESSION: No aspiration or penetration seen.  lease refer to the Speech Pathologists report for complete details and recommendations.  The patient reports that she has been going through speech pathology for some rehab and reports that the thing that has helped her the most was some nasal spray for postnasal drip that has made her swallowing better.  She has been given instructions on how to decrease her dysphonia.  She is not having any overt heartburn at the present time.  She does report that she is having rectal bleeding with almost every bowel movement and states it is bright red blood per rectum.  The patient has a history of polyps at her last colonoscopy and was recommended to have a repeat colonoscopy in 3 years.  Past Medical History:  Diagnosis Date   Actinic keratosis    Acute pulmonary embolus ([place]) 01/29/2020   Acute renal failure (ARF) ([place]) 01/31/2020   Acute respiratory failure with hypoxia ([place]) 01/29/2020   Acute septic pulmonary embolism (H[facility])    AKI (acute kidney injury) ([place])    Amaurosis fugax, right eye 12/2018   Arthritis    Asthma    Basal cell carcinoma 07/2016   central forehead (tx in [place])    Cardiomyopathy [facility])    a. 04/2018 Echo: EF 45-50%. Anteroseptal and apical HK in some views. Mild AI/MR. Nl RV fxn; b. 05/2018 MV: mid-dist ant and antsept mild defect - ? breast atten. EF 41%. No ischemia; c. 03/2019 Echo: EF 30-35%, Gr2 DD, Trace MR, triv TR. Asc Ao ectatic dil - 35mm.    GERD (gastroesophageal reflux disease)    Herniated disc, cervical    Hyperlipidemia    Left wrist fracture    Melanoma ([place]) 05/12/2020   Melanoma IS R post neck, excised 08/10/20   Osteoporosis    Pneumonia due to COVID-19 virus 01/26/2020   Stroke ([place]) 1998   SVT (supraventricular tachycardia) ([place])    a.2008 s/p RFCA for AVNRT; b. 9 & 02/2019 Zio x 2: 1. Avg HR 89, 3 runs NSVT (longest 7 beats), Mobitz 1. 2. Avg HR 89, NSVT x 1 (5 beats), Mobitz 1. No signif arrhythmia.    Current Outpatient Medications  Medication Sig Dispense Refill   albuterol (PROVENTIL HFA;VENTOLIN HFA) 108 (90 Base) MCG/ACT inhaler Inhale 1-2 puffs into the lungs every 6 (six) hours as needed for wheezing or shortness of breath.      B Complex-C (B-COMPLEX WITH VITAMIN C) tablet Take 1 tablet by mouth daily.      baclofen (LIORESAL) 10 MG tablet TAKE 1 TABLET BY MOUTH THREE TIMES A DAY 270 tablet 0   Bioflavonoid Products (ESTER-C) TABS Take  1 tablet by mouth 2 (two) times daily.      calcium carbonate (OS-CAL - DOSED IN MG OF ELEMENTAL CALCIUM) 1250 (500 Ca) MG tablet Take 1 tablet by mouth.     carvedilol (COREG) 3.125 MG tablet TAKE 1 TABLET (3.125 MG TOTAL) BY MOUTH 2 (TWO) TIMES DAILY WITH A MEAL. 180 tablet 3   cholecalciferol (VITAMIN D) 25 MCG (1000 UT) tablet Take 1,000 Units by mouth daily.     Cranberry 1000 MG CAPS Take 1,000 mg by mouth daily.      ezetimibe (ZETIA) 10 MG tablet TAKE 1 TABLET BY MOUTH EVERY DAY 90 tablet 2   fluticasone (FLOVENT HFA) 110 MCG/ACT inhaler Inhale 2 puffs into the lungs 2 (two) times daily. 36 g 0   gabapentin (NEURONTIN) 300 MG capsule TAKE 1 CAPSULE BY MOUTH 3 TIMES DAILY. AND TAKE  3 CAPSULES DAILY AT BEDTIME. 540 capsule 1   Ginger, Zingiber officinalis, (GINGER PO) Take 1,100 mg by mouth daily.      ipratropium (ATROVENT) 0.03 % nasal spray SMARTSIG:1-2 Spray(s) Both Nares 3 Times Daily     MAG ASPART-POTASSIUM ASPART PO Take 1 tablet by mouth 2 (two) times daily. 600 mg-198 mg     Misc Natural Products (TART CHERRY ADVANCED PO) Take 1 capsule by mouth daily.      montelukast (SINGULAIR) 10 MG tablet Take 10 mg by mouth every evening.     Multiple Vitamins-Minerals (MULTIVITAMIN WITH MINERALS) tablet Take 1 tablet by mouth daily. Centrum Silver     mupirocin ointment (BACTROBAN) 2 % Apply 1 application topically daily. Qd to excision site with dressing changes 22 g 0   Olodaterol HCl (STRIVERDI RESPIMAT) 2.5 MCG/ACT AERS Inhale 2 puffs into the lungs daily. USE AT THE SAME TIME EVERYDAY 12 g 2   Probiotic Product (PROBIOTIC DAILY PO) Take 1 capsule by mouth 2 (two) times a week.      rivaroxaban (XARELTO) 20 MG TABS tablet Take 1 tablet (20 mg total) by mouth as needed (PRN for long car or plane ride). 30 tablet 3   sacubitril-valsartan (ENTRESTO) 24-26 MG Take 1 tablet by mouth 2 (two) times daily. 180 tablet 3   Turmeric 500 MG CAPS Take 500 mg by mouth daily.      zinc sulfate 220 (50 Zn) MG capsule Take 1 capsule (220 mg total) by mouth daily. 30 capsule 0   carvedilol (COREG) 6.25 MG tablet Take 1 tablet (6.25 mg total) by mouth 2 (two) times daily with a meal. (Patient not taking: Reported on 04/15/2021) 180 tablet 0   famotidine (PEPCID) 20 MG tablet Take 1 tablet (20 mg total) by mouth 2 (two) times daily for 10 days. 20 tablet 0   predniSONE (DELTASONE) 10 MG tablet Take 40 mg on day 1 and decrease by 10mg  each day -- 40, then 30, then 20, then 10 for a total of 4 days. (Patient not taking: Reported on 04/15/2021) 10 tablet 0   No current facility-administered medications for this visit.    Allergies as of 04/26/2021 - Review Complete 04/26/2021  Allergen  Reaction Noted   Amoxicillin Hives, Shortness Of Breath, and Swelling 03/21/2017   Aspirin Anaphylaxis 03/21/2017   Ciprofloxacin Rash 08/28/2017   Ciprofloxacin-dexamethasone Anaphylaxis 03/21/2017   Crestor [rosuvastatin] Other (See Comments) 03/21/2017   Morphine and related Nausea And Vomiting 07/04/2017   Doxycycline hyclate Hives 03/21/2017   Hydrochlorothiazide Other (See Comments) 03/21/2017   Keflex [cephalexin] Hives 03/21/2017   Lovastatin Other (See  Comments) 03/21/2017   Simvastatin Other (See Comments) 03/21/2017   Erythromycin Hives 03/21/2017   Ibuprofen Other (See Comments) 03/21/2017   Neomycin-bacitracin zn-polymyx Hives and Rash 03/21/2017   Omeprazole Palpitations 03/21/2017   Tape Hives and Rash 03/21/2017    ROS:  General: Negative for anorexia, weight loss, fever, chills, fatigue, weakness. ENT: Negative for hoarseness, difficulty swallowing , nasal congestion. CV: Negative for chest pain, angina, palpitations, dyspnea on exertion, peripheral edema.  Respiratory: Negative for dyspnea at rest, dyspnea on exertion, cough, sputum, wheezing.  GI: See history of present illness. GU:  Negative for dysuria, hematuria, urinary incontinence, urinary frequency, nocturnal urination.  Endo: Negative for unusual weight change.    Physical Examination:   BP 135/79 (BP Location: Left Arm, Patient Position: Sitting, Cuff Size: Large)   Pulse 95   Temp 97.9 F (36.6 C) (Temporal)   Ht 5\' 6"  (1.676 m)   Wt 245 lb 6.4 oz (111.3 kg)   BMI 39.61 kg/m   General: Well-nourished, well-developed in no acute distress.  Eyes: No icterus. Conjunctivae pink. Skin: Warm and dry, no jaundice.   Psych: Alert and cooperative, normal mood and affect.  Labs:    Imaging Studies: DG Chest 2 View  Result Date: 03/31/2021 CLINICAL DATA:  Pt complains of HTN. Pt states that she has been weaker today than normal. Pt states this is new and she knew something was "off" and that is why  she came. Pt denies any dizziness, SHOB, chest pain, or nausea. Pt doesn't remember what her BP was at home. Pt did take her BP medication this morning. EXAM: CHEST - 2 VIEW COMPARISON:  01/26/2020 and older exams.  CT, 01/28/2020. FINDINGS: Cardiac silhouette is normal in size. No mediastinal or hilar masses. No evidence of adenopathy. Stable elevation of the right hemidiaphragm. Mild linear/reticular opacities in the lower lungs consistent with atelectasis and/or scarring. Lungs otherwise clear. No pleural effusion or pneumothorax. Stable right shoulder reverse arthroplasty. No acute skeletal abnormality. IMPRESSION: No active cardiopulmonary disease. Electronically Signed   By: Lajean Manes M.D.   On: 03/31/2021 16:24    Assessment and Plan:   Audrey Martinez is a 75 y.o. y/o female who comes in today with a history of rectal bleeding and she reports her dysphagia does not seem to be a problem it is more of a dysphonia for which she is seeing speech pathology and given exercises and precautions when she eats.  She has also been helped by nasal spray for postnasal drip.  The patient gives a story that is very consistent with her having hemorrhoidal bleeding.  The patient does report that her mother had a colectomy due to colon cancer but the patient is not sure what age the mother was at time of diagnosis.  Due to her history of colon polyps she will be set up for a colonoscopy by Dr. Marius Ditch since she has seen Dr. Marius Ditch in the past and subsequent to that the patient will should undergo banding of her internal hemorrhoids.  The patient has been explained the plan agrees with it.     Lucilla Lame, MD. [facility]    Note: This dictation was prepared with Dragon dictation along with smaller phrase technology. Any transcriptional errors that result from this process are unintentional.

## 2021-04-27 ENCOUNTER — Telehealth: Payer: Self-pay | Admitting: Speech Pathology

## 2021-04-27 ENCOUNTER — Ambulatory Visit: Payer: Medicare Other | Admitting: Speech Pathology

## 2021-04-27 NOTE — Telephone Encounter (Signed)
I spoke with Libby who states that there was a misunderstanding and she thought that today's session was canceled along with Thursday's session.   Confirmed next treatment on Tuesday May 04, 2021 at [facility] [place], [place] Office [phone]

## 2021-04-29 ENCOUNTER — Encounter: Payer: 59 | Admitting: Speech Pathology

## 2021-04-30 ENCOUNTER — Encounter: Payer: Self-pay | Admitting: Gastroenterology

## 2021-05-03 ENCOUNTER — Encounter
Admission: RE | Disposition: A | Discharge status: Home / Self Care | Payer: Self-pay | Source: Home / Self Care | Attending: Gastroenterology

## 2021-05-03 ENCOUNTER — Ambulatory Visit: Payer: Medicare Other | Admitting: Certified Registered Nurse Anesthetist

## 2021-05-03 ENCOUNTER — Encounter: Payer: Self-pay | Admitting: Gastroenterology

## 2021-05-03 ENCOUNTER — Ambulatory Visit
Admission: RE | Admit: 2021-05-03 | Discharge: 2021-05-03 | Disposition: A | Discharge status: Home / Self Care | Payer: Medicare Other | Attending: Gastroenterology | Admitting: Gastroenterology

## 2021-05-03 ENCOUNTER — Other Ambulatory Visit: Payer: Self-pay

## 2021-05-03 DIAGNOSIS — Z96651 Presence of right artificial knee joint: Secondary | ICD-10-CM | POA: Insufficient documentation

## 2021-05-03 DIAGNOSIS — Z860100 Personal history of colon polyps, unspecified: Secondary | ICD-10-CM

## 2021-05-03 DIAGNOSIS — K644 Residual hemorrhoidal skin tags: Secondary | ICD-10-CM | POA: Diagnosis not present

## 2021-05-03 DIAGNOSIS — J449 Chronic obstructive pulmonary disease, unspecified: Secondary | ICD-10-CM | POA: Diagnosis not present

## 2021-05-03 DIAGNOSIS — I4891 Unspecified atrial fibrillation: Secondary | ICD-10-CM | POA: Diagnosis not present

## 2021-05-03 DIAGNOSIS — K5730 Diverticulosis of large intestine without perforation or abscess without bleeding: Secondary | ICD-10-CM | POA: Insufficient documentation

## 2021-05-03 DIAGNOSIS — Z86711 Personal history of pulmonary embolism: Secondary | ICD-10-CM | POA: Diagnosis not present

## 2021-05-03 DIAGNOSIS — I2510 Atherosclerotic heart disease of native coronary artery without angina pectoris: Secondary | ICD-10-CM | POA: Insufficient documentation

## 2021-05-03 DIAGNOSIS — Z85828 Personal history of other malignant neoplasm of skin: Secondary | ICD-10-CM | POA: Insufficient documentation

## 2021-05-03 DIAGNOSIS — I10 Essential (primary) hypertension: Secondary | ICD-10-CM | POA: Diagnosis not present

## 2021-05-03 DIAGNOSIS — Z8616 Personal history of COVID-19: Secondary | ICD-10-CM | POA: Insufficient documentation

## 2021-05-03 DIAGNOSIS — D123 Benign neoplasm of transverse colon: Secondary | ICD-10-CM | POA: Insufficient documentation

## 2021-05-03 DIAGNOSIS — D122 Benign neoplasm of ascending colon: Secondary | ICD-10-CM | POA: Diagnosis not present

## 2021-05-03 DIAGNOSIS — K219 Gastro-esophageal reflux disease without esophagitis: Secondary | ICD-10-CM | POA: Diagnosis not present

## 2021-05-03 DIAGNOSIS — Z1211 Encounter for screening for malignant neoplasm of colon: Secondary | ICD-10-CM | POA: Diagnosis not present

## 2021-05-03 DIAGNOSIS — E785 Hyperlipidemia, unspecified: Secondary | ICD-10-CM | POA: Insufficient documentation

## 2021-05-03 DIAGNOSIS — K635 Polyp of colon: Secondary | ICD-10-CM | POA: Diagnosis not present

## 2021-05-03 DIAGNOSIS — Z96611 Presence of right artificial shoulder joint: Secondary | ICD-10-CM | POA: Diagnosis not present

## 2021-05-03 DIAGNOSIS — Z85820 Personal history of malignant melanoma of skin: Secondary | ICD-10-CM | POA: Insufficient documentation

## 2021-05-03 DIAGNOSIS — D120 Benign neoplasm of cecum: Secondary | ICD-10-CM | POA: Insufficient documentation

## 2021-05-03 DIAGNOSIS — Z8673 Personal history of transient ischemic attack (TIA), and cerebral infarction without residual deficits: Secondary | ICD-10-CM | POA: Diagnosis not present

## 2021-05-03 DIAGNOSIS — Z09 Encounter for follow-up examination after completed treatment for conditions other than malignant neoplasm: Secondary | ICD-10-CM | POA: Diagnosis present

## 2021-05-03 HISTORY — DX: Atherosclerotic heart disease of native coronary artery without angina pectoris: I25.10

## 2021-05-03 HISTORY — DX: Cardiac arrhythmia, unspecified: I49.9

## 2021-05-03 HISTORY — PX: COLONOSCOPY WITH PROPOFOL: SHX5780

## 2021-05-03 HISTORY — DX: Dyspnea, unspecified: R06.00

## 2021-05-03 SURGERY — COLONOSCOPY WITH PROPOFOL
Anesthesia: General

## 2021-05-03 MED ORDER — SODIUM CHLORIDE 0.9 % IV SOLN: INTRAVENOUS | Status: DC

## 2021-05-03 MED ORDER — LIDOCAINE HCL (CARDIAC) PF 100 MG/5ML IV SOSY
PREFILLED_SYRINGE | INTRAVENOUS | Status: DC | PRN
  Administered 2021-05-03: 100 mg via INTRAVENOUS

## 2021-05-03 MED ORDER — PROPOFOL 10 MG/ML IV BOLUS
INTRAVENOUS | Status: DC | PRN
  Administered 2021-05-03: 50 mg via INTRAVENOUS

## 2021-05-03 MED ORDER — PROPOFOL 500 MG/50ML IV EMUL
INTRAVENOUS | Status: DC | PRN
  Administered 2021-05-03: 140 ug/kg/min via INTRAVENOUS

## 2021-05-03 NOTE — Anesthesia Preprocedure Evaluation (Signed)
Anesthesia Evaluation  Patient identified by MRN, date of birth, ID band Patient awake    Reviewed: Allergy & Precautions, H&P , NPO status , Patient's Chart, lab work & pertinent test results, reviewed documented beta blocker date and time   Airway Mallampati: II   Neck ROM: full    Dental  (+) Poor Dentition   Pulmonary shortness of breath and with exertion, asthma , pneumonia, COPD,    Pulmonary exam normal        Cardiovascular Exercise Tolerance: Poor hypertension, + CAD  + dysrhythmias Atrial Fibrillation  Rhythm:regular Rate:Normal     Neuro/Psych  Neuromuscular disease CVA, No Residual Symptoms negative psych ROS   GI/Hepatic negative GI ROS, Neg liver ROS,   Endo/Other  negative endocrine ROS  Renal/GU Renal disease  negative genitourinary   Musculoskeletal   Abdominal   Peds  Hematology negative hematology ROS (+)   Anesthesia Other Findings Past Medical History: No date: Actinic keratosis 01/29/2020: Acute pulmonary embolus ([place]) 01/31/2020: Acute renal failure (ARF) ([place]) 01/29/2020: Acute respiratory failure with hypoxia (HCC) No date: Acute septic pulmonary embolism (HCC) No date: AKI (acute kidney injury) ([place]) 12/2018: Amaurosis fugax, right eye No date: Arthritis No date: Asthma 07/2016: Basal cell carcinoma     Comment:  central forehead (tx in [place]) No date: Cardiomyopathy [facility])     Comment:  a. 04/2018 Echo: EF 45-50%. Anteroseptal and apical HK               in some views. Mild AI/MR. Nl RV fxn; b. 05/2018 MV:               mid-dist ant and antsept mild defect - ? breast atten. EF              41%. No ischemia; c. 03/2019 Echo: EF 30-35%, Gr2 DD,               Trace MR, triv TR. Asc Ao ectatic dil - 68mm.  No date: Coronary artery disease No date: Dyspnea No date: Dysrhythmia No date: GERD (gastroesophageal reflux disease) No date: Herniated disc, lumbar No date:  Hyperlipidemia No date: Left wrist fracture 05/12/2020: Melanoma ([place])     Comment:  Melanoma IS R post neck, excised 08/10/20 No date: Osteoporosis 01/26/2020: Pneumonia due to COVID-19 virus 1998: Stroke ([place]) No date: SVT (supraventricular tachycardia) ([place])     Comment:  a.2008 s/p RFCA for AVNRT; b. 9 & 02/2019 Zio x 2: 1.               Avg HR 89, 3 runs NSVT (longest 7 beats), Mobitz 1. 2.               Avg HR 89, NSVT x 1 (5 beats), Mobitz 1. No signif               arrhythmia. Past Surgical History: 2017: BACK SURGERY     Comment:  L2-4 laminectomy and foraminal stenosis 1977: BREAST CYST ASPIRATION; Left 2009: CARDIAC CATHETERIZATION     Comment:  [facility] [place]: [place] 2010: CATARACT EXTRACTION, BILATERAL 03/21/2018: COLONOSCOPY WITH PROPOFOL; N/A     Comment:  Procedure: COLONOSCOPY WITH Biopsy;  Surgeon: Lin Landsman, MD;  Location: [place];                Service: Endoscopy;  Laterality: N/A; No date: CORONARY  ANGIOPLASTY 1977-78: derrick procedure     Comment:  bilateral  2013: ESOPHAGOGASTRODUODENOSCOPY     Comment:  gastritis; done for complaint of dysphagia 03/21/2018: ESOPHAGOGASTRODUODENOSCOPY (EGD) WITH PROPOFOL; N/A     Comment:  Procedure: ESOPHAGOGASTRODUODENOSCOPY (EGD) WITH               Biopsies;  Surgeon: Lin Landsman, MD;  Location:               [place];  Service: Endoscopy;  Laterality:               N/A;  KEEP THIS PATIENT FIRST No date: JOINT REPLACEMENT 05/02/2019: LEFT HEART CATH AND CORONARY ANGIOGRAPHY; Left     Comment:  Procedure: LEFT HEART CATH AND CORONARY ANGIOGRAPHY;                Surgeon: Minna Merritts, MD;  Location: [place]               CV LAB;  Service: Cardiovascular;  Laterality: Left; No date: lymph node removal     Comment:  neck 07/2016: MOHS SURGERY     Comment:  BCCA nose 03/21/2018:  POLYPECTOMY; N/A     Comment:  Procedure: POLYPECTOMY;  Surgeon: Lin Landsman,               MD;  Location: [place];  Service: Endoscopy;               Laterality: N/A; 2013: REPLACEMENT TOTAL KNEE     Comment:  RT 2012: TOTAL SHOULDER REPLACEMENT     Comment:  RT  BMI    Body Mass Index: 39.38 kg/m     Reproductive/Obstetrics negative OB ROS                             Anesthesia Physical Anesthesia Plan  ASA: 4  Anesthesia Plan: General   Post-op Pain Management:    Induction:   PONV Risk Score and Plan:   Airway Management Planned:   Additional Equipment:   Intra-op Plan:   Post-operative Plan:   Informed Consent: I have reviewed the patients History and Physical, chart, labs and discussed the procedure including the risks, benefits and alternatives for the proposed anesthesia with the patient or authorized representative who has indicated his/her understanding and acceptance.     Dental Advisory Given  Plan Discussed with: CRNA  Anesthesia Plan Comments:         Anesthesia Quick Evaluation

## 2021-05-03 NOTE — H&P (Signed)
Cephas Darby, MD [address]  [place]  [place], [place] [address]  Main: [phone]  Fax: [phone] Pager: [phone]  Primary Care Physician:  Glean Hess, MD Primary Gastroenterologist:  Dr. Cephas Darby  Pre-Procedure History & Physical: HPI:  Mallika Sanmiguel is a 75 y.o. female is here for an colonoscopy.   Past Medical History:  Diagnosis Date   Actinic keratosis    Acute pulmonary embolus ([place]) 01/29/2020   Acute renal failure (ARF) ([place]) 01/31/2020   Acute respiratory failure with hypoxia ([place]) 01/29/2020   Acute septic pulmonary embolism (HCC)    AKI (acute kidney injury) ([place])    Amaurosis fugax, right eye 12/2018   Arthritis    Asthma    Basal cell carcinoma 07/2016   central forehead (tx in [place])   Cardiomyopathy [facility])    a. 04/2018 Echo: EF 45-50%. Anteroseptal and apical HK in some views. Mild AI/MR. Nl RV fxn; b. 05/2018 MV: mid-dist ant and antsept mild defect - ? breast atten. EF 41%. No ischemia; c. 03/2019 Echo: EF 30-35%, Gr2 DD, Trace MR, triv TR. Asc Ao ectatic dil - 51mm.    Coronary artery disease    Dyspnea    Dysrhythmia    GERD (gastroesophageal reflux disease)    Herniated disc, lumbar    Hyperlipidemia    Left wrist fracture    Melanoma ([place]) 05/12/2020   Melanoma IS R post neck, excised 08/10/20   Osteoporosis    Pneumonia due to COVID-19 virus 01/26/2020   Stroke ([place]) 1998   SVT (supraventricular tachycardia) ([place])    a.2008 s/p RFCA for AVNRT; b. 9 & 02/2019 Zio x 2: 1. Avg HR 89, 3 runs NSVT (longest 7 beats), Mobitz 1. 2. Avg HR 89, NSVT x 1 (5 beats), Mobitz 1. No signif arrhythmia.    Past Surgical History:  Procedure Laterality Date   BACK SURGERY  2017   L2-4 laminectomy and foraminal stenosis   BREAST CYST ASPIRATION Left 1977   CARDIAC CATHETERIZATION  2009   [facility] [place]  2009   CATARACT EXTRACTION, BILATERAL   2010   COLONOSCOPY WITH PROPOFOL N/A 03/21/2018   Procedure: COLONOSCOPY WITH Biopsy;  Surgeon: Lin Landsman, MD;  Location: [place];  Service: Endoscopy;  Laterality: N/A;   CORONARY ANGIOPLASTY     derrick procedure  1977-78   bilateral    ESOPHAGOGASTRODUODENOSCOPY  2013   gastritis; done for complaint of dysphagia   ESOPHAGOGASTRODUODENOSCOPY (EGD) WITH PROPOFOL N/A 03/21/2018   Procedure: ESOPHAGOGASTRODUODENOSCOPY (EGD) WITH Biopsies;  Surgeon: Lin Landsman, MD;  Location: [place];  Service: Endoscopy;  Laterality: N/A;  KEEP THIS PATIENT FIRST   JOINT REPLACEMENT     LEFT HEART CATH AND CORONARY ANGIOGRAPHY Left 05/02/2019   Procedure: LEFT HEART CATH AND CORONARY ANGIOGRAPHY;  Surgeon: Minna Merritts, MD;  Location: [place] CV LAB;  Service: Cardiovascular;  Laterality: Left;   lymph node removal     neck   MOHS SURGERY  07/2016   BCCA nose   POLYPECTOMY N/A 03/21/2018   Procedure: POLYPECTOMY;  Surgeon: Lin Landsman, MD;  Location: [place];  Service: Endoscopy;  Laterality: N/A;   REPLACEMENT TOTAL KNEE  2013   RT   TOTAL SHOULDER REPLACEMENT  2012   RT     Prior to Admission medications   Medication Sig Start Date End Date Taking? Authorizing Provider  albuterol (  PROVENTIL HFA;VENTOLIN HFA) 108 (90 Base) MCG/ACT inhaler Inhale 1-2 puffs into the lungs every 6 (six) hours as needed for wheezing or shortness of breath.     [provider]  B Complex-C (B-COMPLEX WITH VITAMIN C) tablet Take 1 tablet by mouth daily.     [provider]  baclofen (LIORESAL) 10 MG tablet TAKE 1 TABLET BY MOUTH THREE TIMES A DAY 03/22/21   Glean Hess, MD  Bioflavonoid Products (ESTER-C) TABS Take 1 tablet by mouth 2 (two) times daily.     [provider]  calcium carbonate (OS-CAL - DOSED IN MG OF ELEMENTAL CALCIUM) 1250 (500 Ca) MG tablet Take 1 tablet by mouth.    [provider]   carvedilol (COREG) 3.125 MG tablet TAKE 1 TABLET (3.125 MG TOTAL) BY MOUTH 2 (TWO) TIMES DAILY WITH A MEAL. 03/18/21   Minna Merritts, MD  carvedilol (COREG) 6.25 MG tablet Take 1 tablet (6.25 mg total) by mouth 2 (two) times daily with a meal. Patient not taking: Reported on 04/15/2021 01/04/21   Minna Merritts, MD  cholecalciferol (VITAMIN D) 25 MCG (1000 UT) tablet Take 1,000 Units by mouth daily.    [provider]  Cranberry 1000 MG CAPS Take 1,000 mg by mouth daily.     [provider]  ezetimibe (ZETIA) 10 MG tablet TAKE 1 TABLET BY MOUTH EVERY DAY 10/29/20   Glean Hess, MD  famotidine (PEPCID) 20 MG tablet Take 1 tablet (20 mg total) by mouth 2 (two) times daily for 10 days. 01/20/21 01/30/21  Marlana Salvage, PA  fluticasone (FLOVENT HFA) 110 MCG/ACT inhaler Inhale 2 puffs into the lungs 2 (two) times daily. 03/19/21   Flora Lipps, MD  gabapentin (NEURONTIN) 300 MG capsule TAKE 1 CAPSULE BY MOUTH 3 TIMES DAILY. AND TAKE 3 CAPSULES DAILY AT BEDTIME. 06/22/20   Glean Hess, MD  Ginger, Zingiber officinalis, (GINGER PO) Take 1,100 mg by mouth daily.     [provider]  ipratropium (ATROVENT) 0.03 % nasal spray SMARTSIG:1-2 Spray(s) Both Nares 3 Times Daily 03/14/21   [provider]  MAG ASPART-POTASSIUM ASPART PO Take 1 tablet by mouth 2 (two) times daily. 600 mg-198 mg    [provider]  Misc Natural Products (TART CHERRY ADVANCED PO) Take 1 capsule by mouth daily.     [provider]  montelukast (SINGULAIR) 10 MG tablet Take 10 mg by mouth every evening. 10/25/18   [provider]  Multiple Vitamins-Minerals (MULTIVITAMIN WITH MINERALS) tablet Take 1 tablet by mouth daily. Centrum Silver    [provider]  mupirocin ointment (BACTROBAN) 2 % Apply 1 application topically daily. Qd to excision site with dressing changes 08/10/20   Brendolyn Patty, MD  Olodaterol HCl (STRIVERDI RESPIMAT) 2.5 MCG/ACT AERS  Inhale 2 puffs into the lungs daily. USE AT THE SAME TIME EVERYDAY 03/19/21   Flora Lipps, MD  predniSONE (DELTASONE) 10 MG tablet Take 40 mg on day 1 and decrease by 10mg  each day -- 40, then 30, then 20, then 10 for a total of 4 days. Patient not taking: Reported on 04/15/2021 01/20/21   Marlana Salvage, PA  Probiotic Product (PROBIOTIC DAILY PO) Take 1 capsule by mouth 2 (two) times a week.     [provider]  rivaroxaban (XARELTO) 20 MG TABS tablet Take 1 tablet (20 mg total) by mouth as needed (PRN for long car or plane ride). 02/19/21   Minna Merritts, MD  sacubitril-valsartan (ENTRESTO) 24-26 MG Take 1 tablet by mouth 2 (two) times daily. 02/19/21   Minna Merritts, MD  Sod Picosulfate-Mag Ox-Cit Acd (CLENPIQ) 10-3.5-12 MG-GM -GM/160ML SOLN Take 320 mLs by mouth as directed. 04/26/21   Lin Landsman, MD  Turmeric 500 MG CAPS Take 500 mg by mouth daily.     [provider]  zinc sulfate 220 (50 Zn) MG capsule Take 1 capsule (220 mg total) by mouth daily. 02/02/20   Allie Bossier, MD    Allergies as of 04/26/2021 - Review Complete 04/26/2021  Allergen Reaction Noted   Amoxicillin Hives, Shortness Of Breath, and Swelling 03/21/2017   Aspirin Anaphylaxis 03/21/2017   Ciprofloxacin Rash 08/28/2017   Ciprofloxacin-dexamethasone Anaphylaxis 03/21/2017   Crestor [rosuvastatin] Other (See Comments) 03/21/2017   Morphine and related Nausea And Vomiting 07/04/2017   Doxycycline hyclate Hives 03/21/2017   Hydrochlorothiazide Other (See Comments) 03/21/2017   Keflex [cephalexin] Hives 03/21/2017   Lovastatin Other (See Comments) 03/21/2017   Simvastatin Other (See Comments) 03/21/2017   Erythromycin Hives 03/21/2017   Ibuprofen Other (See Comments) 03/21/2017   Neomycin-bacitracin zn-polymyx Hives and Rash 03/21/2017   Omeprazole Palpitations 03/21/2017   Tape Hives and Rash 03/21/2017    Family History  Problem Relation Age of Onset   Diabetes Mother     Stroke Mother    Valvular heart disease Father    CAD Sister 54       died of MI   Stroke Sister    Stroke Sister 77   Clotting disorder Half-Sister    Clotting disorder Half-Sister    Breast cancer Neg Hx     Social History   Socioeconomic History   Marital status: Widowed    Spouse name: Not on file   Number of children: 3   Years of education: Not on file   Highest education level: Not on file  Occupational History   Occupation: retired  Tobacco Use   Smoking status: Never   Smokeless tobacco: Never  Vaping Use   Vaping Use: Never used  Substance and Sexual Activity   Alcohol use: Not Currently    Comment: may drink 1-2x/yr   Drug use: No   Sexual activity: Never  Other Topics Concern   Not on file  Social History Narrative   ** Merged History Encounter **       Lives at home alone, has resources if help is needed   Social Determinants of Radio broadcast assistant Strain: Low Risk    Difficulty of Paying Living Expenses: Not hard at all  Food Insecurity: No Food Insecurity   Worried About Charity fundraiser in the Last Year: Never true   Arboriculturist in the Last Year: Never true  Transportation Needs: No Transportation Needs   Lack of Transportation (Medical): No   Lack of Transportation (Non-Medical): No  Physical Activity: Insufficiently Active   Days of Exercise per Week: 7 days   Minutes of Exercise per Session: 20 min  Stress: Not on file  Social Connections: Moderately Integrated   Frequency of Communication with Friends and Family: More than three times a week   Frequency of Social Gatherings with Friends and Family: Twice a week   Attends Religious Services: More than 4 times per year   Active Member of [employer] or Organizations: No   Attends Music therapist: More than 4 times per year   Marital Status: Widowed  Intimate Partner Violence: Not At Risk  Fear of Current or Ex-Partner: No   Emotionally Abused: No   Physically  Abused: No   Sexually Abused: No    Review of Systems: See HPI, otherwise negative ROS  Physical Exam: BP 129/87   Pulse 100   Temp (!) 96.7 F (35.9 C) (Temporal)   Resp 20   Ht 5\' 6"  (1.676 m)   Wt 110.7 kg   BMI 39.38 kg/m  General:   Alert,  pleasant and cooperative in NAD Head:  Normocephalic and atraumatic. Neck:  Supple; no masses or thyromegaly. Lungs:  Clear throughout to auscultation.    Heart:  Regular rate and rhythm. Abdomen:  Soft, nontender and nondistended. Normal bowel sounds, without guarding, and without rebound.   Neurologic:  Alert and  oriented x4;  grossly normal neurologically.  Impression/Plan: Neelam Tiggs is here for an colonoscopy to be performed for h/o colon polyps  Risks, benefits, limitations, and alternatives regarding  colonoscopy have been reviewed with the patient.  Questions have been answered.  All parties agreeable.   Sherri Sear, MD  05/03/2021, 10:10 AM

## 2021-05-03 NOTE — Anesthesia Postprocedure Evaluation (Signed)
Anesthesia Post Note  Patient: Audrey Martinez  Procedure(s) Performed: COLONOSCOPY WITH PROPOFOL  Patient location during evaluation: PACU Anesthesia Type: General Level of consciousness: awake and alert Pain management: pain level controlled Vital Signs Assessment: post-procedure vital signs reviewed and stable Respiratory status: spontaneous breathing, nonlabored ventilation, respiratory function stable and patient connected to nasal cannula oxygen Cardiovascular status: blood pressure returned to baseline and stable Postop Assessment: no apparent nausea or vomiting Anesthetic complications: no   No notable events documented.   Last Vitals:  Vitals:   05/03/21 1109 05/03/21 1119  BP: 140/88 (!) 151/87  Pulse: 89 83  Resp: 19 15  Temp:    SpO2: 98% 100%    Last Pain:  Vitals:   05/03/21 1119  TempSrc:   PainSc: 0-No pain                 Molli Barrows

## 2021-05-03 NOTE — Anesthesia Procedure Notes (Signed)
Date/Time: 05/03/2021 10:19 AM Performed by: Lily Peer, Loleta Frommelt, CRNA Pre-anesthesia Checklist: Patient identified, Emergency Drugs available, Suction available, Patient being monitored and Timeout performed Patient Re-evaluated:Patient Re-evaluated prior to induction Oxygen Delivery Method: Nasal cannula Induction Type: IV induction

## 2021-05-03 NOTE — Op Note (Signed)
[facility] Gastroenterology Patient Name: Audrey Martinez Procedure Date: 05/03/2021 10:09 AM MRN: [id number] Account #: [id number] Date of Birth: 1945-12-03 Admit Type: Outpatient Age: 75 Room: [facility] ENDO ROOM 1 Gender: Female Note Status: Finalized Instrument Name: Park Meo [id number] Procedure:             Colonoscopy Indications:           Surveillance: Personal history of adenomatous polyps                         on last colonoscopy 3 years ago, Last colonoscopy:                         October 2019 Providers:             Lin Landsman MD, MD Medicines:             General Anesthesia Complications:         No immediate complications. Estimated blood loss: None. Procedure:             Pre-Anesthesia Assessment:                        - Prior to the procedure, a History and Physical was                         performed, and patient medications and allergies were                         reviewed. The patient is competent. The risks and                         benefits of the procedure and the sedation options and                         risks were discussed with the patient. All questions                         were answered and informed consent was obtained.                         Patient identification and proposed procedure were                         verified by the physician, the nurse, the                         anesthesiologist, the anesthetist and the technician                         in the pre-procedure area in the procedure room in the                         endoscopy suite. Mental Status Examination: alert and                         oriented. Airway Examination: normal oropharyngeal                         airway and neck mobility.  Respiratory Examination:                         clear to auscultation. CV Examination: normal.                         Prophylactic Antibiotics: The patient does not require                         prophylactic  antibiotics. Prior Anticoagulants: The                         patient has taken no previous anticoagulant or                         antiplatelet agents. ASA Grade Assessment: III - A                         patient with severe systemic disease. After reviewing                         the risks and benefits, the patient was deemed in                         satisfactory condition to undergo the procedure. The                         anesthesia plan was to use general anesthesia.                         Immediately prior to administration of medications,                         the patient was re-assessed for adequacy to receive                         sedatives. The heart rate, respiratory rate, oxygen                         saturations, blood pressure, adequacy of pulmonary                         ventilation, and response to care were monitored                         throughout the procedure. The physical status of the                         patient was re-assessed after the procedure.                        After obtaining informed consent, the colonoscope was                         passed under direct vision. Throughout the procedure,                         the patient's blood pressure, pulse, and oxygen  saturations were monitored continuously. The                         Colonoscope was introduced through the anus and                         advanced to the the terminal ileum, with                         identification of the appendiceal orifice and IC                         valve. The colonoscopy was performed without                         difficulty. The patient tolerated the procedure well.                         The quality of the bowel preparation was evaluated                         using the BBPS [facility] Bowel Preparation Scale) with                         scores of: Right Colon = 3, Transverse Colon = 3 and                         Left Colon  = 3 (entire mucosa seen well with no                         residual staining, small fragments of stool or opaque                         liquid). The total BBPS score equals 9. Findings:      Skin tags were found on perianal exam.      The terminal ileum appeared normal.      A 3 mm polyp was found in the cecum. The polyp was sessile. The polyp       was removed with a jumbo cold forceps. Resection and retrieval were       complete.      A 8 mm polyp was found in the ascending colon. The polyp was sessile.       The polyp was removed with a cold snare. Resection and retrieval were       complete. To prevent bleeding after the polypectomy, two hemostatic       clips were successfully placed (MR conditional). There was no bleeding       at the end of the procedure.      A 5 mm polyp was found in the ascending colon. The polyp was sessile.       The polyp was removed with a cold snare. Resection and retrieval were       complete.      A 4 mm polyp was found in the transverse colon. The polyp was sessile.       The polyp was removed with a cold snare. Resection and retrieval were       complete.      A few diverticula were  found in the sigmoid colon.      The retroflexed view of the distal rectum and anal verge was normal and       showed no anal or rectal abnormalities. Impression:            - Perianal skin tags found on perianal exam.                        - The examined portion of the ileum was normal.                        - One 3 mm polyp in the cecum, removed with a jumbo                         cold forceps. Resected and retrieved.                        - One 8 mm polyp in the ascending colon, removed with                         a cold snare. Resected and retrieved. Clips (MR                         conditional) were placed.                        - One 5 mm polyp in the ascending colon, removed with                         a cold snare. Resected and retrieved.                         - One 4 mm polyp in the transverse colon, removed with                         a cold snare. Resected and retrieved.                        - Diverticulosis in the sigmoid colon.                        - The distal rectum and anal verge are normal on                         retroflexion view. Recommendation:        - Discharge patient to home (with escort).                        - Resume previous diet today.                        - Continue present medications.                        - Await pathology results.                        - Repeat colonoscopy in 3 - 5 years for surveillance  based on pathology results. Procedure Code(s):     --- Professional ---                        [phone], Colonoscopy, flexible; with removal of                         tumor(s), polyp(s), or other lesion(s) by snare                         technique                        45380, 94, Colonoscopy, flexible; with biopsy, single                         or multiple Diagnosis Code(s):     --- Professional ---                        K63.5, Polyp of colon                        Z86.010, Personal history of colonic polyps                        K64.4, Residual hemorrhoidal skin tags                        K57.30, Diverticulosis of large intestine without                         perforation or abscess without bleeding CPT copyright 2019 American Medical Association. All rights reserved. The codes documented in this report are preliminary and upon coder review may  be revised to meet current compliance requirements. Dr. Ulyess Mort Lin Landsman MD, MD 05/03/2021 10:47:28 AM This report has been signed electronically. Number of Addenda: 0 Note Initiated On: 05/03/2021 10:09 AM Scope Withdrawal Time: 0 hours 15 minutes 53 seconds  Total Procedure Duration: 0 hours 19 minutes 39 seconds  Estimated Blood Loss:  Estimated blood loss: none.      [facility]

## 2021-05-03 NOTE — Transfer of Care (Signed)
Immediate Anesthesia Transfer of Care Note  Patient: Audrey Martinez  Procedure(s) Performed: COLONOSCOPY WITH PROPOFOL  Patient Location: Endoscopy Unit  Anesthesia Type:General  Level of Consciousness: awake, alert  and oriented  Airway & Oxygen Therapy: Patient Spontanous Breathing  Post-op Assessment: Report given to RN and Post -op Vital signs reviewed and stable  Post vital signs: Reviewed and stable  Last Vitals:  Vitals Value Taken Time  BP 118/64 05/03/21 1049  Temp 36.4 C 05/03/21 1049  Pulse 97 05/03/21 1050  Resp 21 05/03/21 1050  SpO2 96 % 05/03/21 1050  Vitals shown include unvalidated device data.  Last Pain:  Vitals:   05/03/21 1049  TempSrc: Tympanic  PainSc: Asleep         Complications: No notable events documented.

## 2021-05-04 ENCOUNTER — Ambulatory Visit: Payer: Medicare Other | Attending: Otolaryngology | Admitting: Speech Pathology

## 2021-05-04 ENCOUNTER — Encounter: Payer: Self-pay | Admitting: Gastroenterology

## 2021-05-04 DIAGNOSIS — R490 Dysphonia: Secondary | ICD-10-CM | POA: Diagnosis not present

## 2021-05-04 LAB — SURGICAL PATHOLOGY

## 2021-05-04 NOTE — Therapy (Signed)
[place] MAIN [facility] SERVICES [address] [place], [place], [address] Phone: [phone]   Fax:  [phone]  Speech Language Pathology Treatment  Patient Details  Name: Audrey Martinez MRN: [id number] Date of Birth: January 03, 1946 Referring Provider (SLP): Carloyn Manner   Encounter Date: 05/04/2021   End of Session - 05/04/21 1510     Visit Number 3    Number of Visits 8    Date for SLP Re-Evaluation 05/14/21    Authorization Type Medicare    Authorization Time Period 04/15/2021 thru 05/14/2021    Authorization - Visit Number 2    Progress Note Due on Visit 10    SLP Start Time 1000    SLP Stop Time  1100    SLP Time Calculation (min) 60 min    Activity Tolerance Patient tolerated treatment well             Past Medical History:  Diagnosis Date   Actinic keratosis    Acute pulmonary embolus ([place]) 01/29/2020   Acute renal failure (ARF) ([place]) 01/31/2020   Acute respiratory failure with hypoxia ([place]) 01/29/2020   Acute septic pulmonary embolism (HCC)    AKI (acute kidney injury) ([place])    Amaurosis fugax, right eye 12/2018   Arthritis    Asthma    Basal cell carcinoma 07/2016   central forehead (tx in [place])   Cardiomyopathy [facility])    a. 04/2018 Echo: EF 45-50%. Anteroseptal and apical HK in some views. Mild AI/MR. Nl RV fxn; b. 05/2018 MV: mid-dist ant and antsept mild defect - ? breast atten. EF 41%. No ischemia; c. 03/2019 Echo: EF 30-35%, Gr2 DD, Trace MR, triv TR. Asc Ao ectatic dil - 5mm.    Coronary artery disease    Dyspnea    Dysrhythmia    GERD (gastroesophageal reflux disease)    Herniated disc, lumbar    Hyperlipidemia    Left wrist fracture    Melanoma ([place]) 05/12/2020   Melanoma IS R post neck, excised 08/10/20   Osteoporosis    Pneumonia due to COVID-19 virus 01/26/2020   Stroke ([place]) 1998   SVT (supraventricular tachycardia) ([place])    a.2008 s/p RFCA for AVNRT; b. 9 & 02/2019 Zio x 2: 1. Avg HR  89, 3 runs NSVT (longest 7 beats), Mobitz 1. 2. Avg HR 89, NSVT x 1 (5 beats), Mobitz 1. No signif arrhythmia.    Past Surgical History:  Procedure Laterality Date   BACK SURGERY  2017   L2-4 laminectomy and foraminal stenosis   BREAST CYST ASPIRATION Left 1977   CARDIAC CATHETERIZATION  2009   [facility] in [place]  2009   CATARACT EXTRACTION, BILATERAL  2010   COLONOSCOPY WITH PROPOFOL N/A 03/21/2018   Procedure: COLONOSCOPY WITH Biopsy;  Surgeon: Lin Landsman, MD;  Location: [place];  Service: Endoscopy;  Laterality: N/A;   COLONOSCOPY WITH PROPOFOL N/A 05/03/2021   Procedure: COLONOSCOPY WITH PROPOFOL;  Surgeon: Lin Landsman, MD;  Location: [facility] ENDOSCOPY;  Service: Gastroenterology;  Laterality: N/A;   CORONARY ANGIOPLASTY     derrick procedure  1977-78   bilateral    ESOPHAGOGASTRODUODENOSCOPY  2013   gastritis; done for complaint of dysphagia   ESOPHAGOGASTRODUODENOSCOPY (EGD) WITH PROPOFOL N/A 03/21/2018   Procedure: ESOPHAGOGASTRODUODENOSCOPY (EGD) WITH Biopsies;  Surgeon: Lin Landsman, MD;  Location: [place];  Service: Endoscopy;  Laterality: N/A;  KEEP THIS PATIENT FIRST   JOINT  REPLACEMENT     LEFT HEART CATH AND CORONARY ANGIOGRAPHY Left 05/02/2019   Procedure: LEFT HEART CATH AND CORONARY ANGIOGRAPHY;  Surgeon: Minna Merritts, MD;  Location: [place] CV LAB;  Service: Cardiovascular;  Laterality: Left;   lymph node removal     neck   MOHS SURGERY  07/2016   BCCA nose   POLYPECTOMY N/A 03/21/2018   Procedure: POLYPECTOMY;  Surgeon: Lin Landsman, MD;  Location: [place];  Service: Endoscopy;  Laterality: N/A;   REPLACEMENT TOTAL KNEE  2013   RT   TOTAL SHOULDER REPLACEMENT  2012   RT     There were no vitals filed for this visit.   Subjective Assessment - 05/04/21 1507     Subjective "I forgot my nose spray this morning"     Currently in Pain? No/denies                   ADULT SLP TREATMENT - 05/04/21 0001       Treatment Provided   Treatment provided Cognitive-Linquistic      Cognitive-Linquistic Treatment   Treatment focused on Voice;Patient/family/caregiver education    Skilled Treatment The patient was provided with written and verbal teaching regarding vocal hygiene.  The patient was provided with written and verbal teaching regarding breath support exercises.  She demonstrates much less clavicular breathing and improved airflow.  Patient instructed in achieving sustained airflow using /s/ and pulsing /s/.              SLP Education - 05/04/21 1509     Education Details abdominal breathing, vocal hygiene    Person(s) Educated Patient    Methods Explanation;Demonstration    Comprehension Verbalized understanding                SLP Long Term Goals - 04/16/21 1549       SLP LONG TERM GOAL #1   Title The patient will demonstrate independent understanding of vocal hygiene concepts and extrinsic laryngeal muscle stretches.      Time 4    Period Weeks    Status New    Target Date 05/14/21      SLP LONG TERM GOAL #2   Title The patient will be independent for abdominal breathing and breath support exercises.    Time 4    Period Weeks    Status New    Target Date 05/14/21              Plan - 05/04/21 1510     Clinical Impression Statement Pt presents with improving dysphonia but required maximal support for abdominal breathing to increase breath support. She does report increased hydration throughout her day. No throat clears were observed during today's session. I recommend skilled ST to train pt in vocal hygiene, improve breath support for voice and reduce laryngeal tension in order to improve vocal quality, endurance, and quality of life.    Speech Therapy Frequency 2x / week    Duration 4 weeks    Treatment/Interventions SLP instruction and feedback;Patient/family  education    Potential to Achieve Goals Good - Fair d/t difficulty recalling information from previous sessions   SLP Home Exercise Plan provided, see pt instructions section    Consulted and Agree with Plan of Care Patient             Patient will benefit from skilled therapeutic intervention in order to improve the following deficits and impairments:   Dysphonia    Problem List  Patient Active Problem List   Diagnosis Date Noted   Hx of colonic polyps    Polyp of colon    Pulmonary embolus ([place]) 01/31/2020   Atrial flutter ([place]) 01/31/2020   Obesity (BMI 30-39.9) 01/31/2020   Left lower lobe pulmonary nodule 01/29/2020   Chronic obstructive pulmonary disease, unspecified COPD type ([place]) 10/08/2019   Dilated cardiomyopathy ([place]) 05/02/2019   Amaurosis fugax of right eye 01/13/2019   Arm pain, right 11/08/2017   Renal insufficiency 11/08/2017   Tendinitis of right knee 07/03/2017   Lumbar radiculopathy 05/18/2017   Status post hemilaminotomy 05/18/2017   Lumbar degenerative disc disease 05/18/2017   Spinal stenosis of lumbar region without neurogenic claudication 05/18/2017   Atrial tachycardia ([place]) 04/14/2017   SVT (supraventricular tachycardia) ([place]) 04/14/2017   Essential hypertension 04/14/2017   Intermittent asthma without complication 29/19/1660   Osteoporosis 03/21/2017   Benign essential tremor 03/21/2017   Hx of atrioventricular node ablation 02/01/2008   Hyperlipidemia 05/12/2003   Personal history of transient ischemic attack (TIA), and cerebral infarction without residual deficits 04/22/2003   Cas Tracz B. Rutherford Nail M.S., CCC-SLP, [facility] Speech-Language Pathologist Rehabilitation Services Office [phone]  Stormy Fabian, [place] 05/04/2021, 3:16 PM  [place] MAIN [facility] SERVICES [address] [place], [place], [address] Phone: [phone]   Fax:  [phone]   Name: Riona Lahti MRN: [id number] Date of  Birth: 03/04/1946

## 2021-05-04 NOTE — Patient Instructions (Signed)
Relaxation exercises, abdominal breathing

## 2021-05-07 ENCOUNTER — Other Ambulatory Visit: Payer: Self-pay

## 2021-05-07 ENCOUNTER — Ambulatory Visit: Payer: Medicare Other | Admitting: Speech Pathology

## 2021-05-07 DIAGNOSIS — R490 Dysphonia: Secondary | ICD-10-CM | POA: Diagnosis not present

## 2021-05-07 NOTE — Therapy (Signed)
[place] MAIN [facility] SERVICES [address] [place], [place], [address] Phone: [phone]   Fax:  [phone]  Speech Language Pathology Treatment  Patient Details  Name: Audrey Martinez MRN: [id number] Date of Birth: 06/22/1945 Referring Provider (SLP): Carloyn Manner   Encounter Date: 05/07/2021   End of Session - 05/07/21 1234     Visit Number 4    Number of Visits 8    Date for SLP Re-Evaluation 05/14/21    Authorization Type Medicare    Authorization Time Period 04/15/2021 thru 05/14/2021    Authorization - Visit Number 3    Progress Note Due on Visit 10    SLP Start Time 1105    SLP Stop Time  1200    SLP Time Calculation (min) 55 min    Activity Tolerance Patient tolerated treatment well             Past Medical History:  Diagnosis Date   Actinic keratosis    Acute pulmonary embolus ([place]) 01/29/2020   Acute renal failure (ARF) ([place]) 01/31/2020   Acute respiratory failure with hypoxia ([place]) 01/29/2020   Acute septic pulmonary embolism (HCC)    AKI (acute kidney injury) ([place])    Amaurosis fugax, right eye 12/2018   Arthritis    Asthma    Basal cell carcinoma 07/2016   central forehead (tx in [place])   Cardiomyopathy [facility])    a. 04/2018 Echo: EF 45-50%. Anteroseptal and apical HK in some views. Mild AI/MR. Nl RV fxn; b. 05/2018 MV: mid-dist ant and antsept mild defect - ? breast atten. EF 41%. No ischemia; c. 03/2019 Echo: EF 30-35%, Gr2 DD, Trace MR, triv TR. Asc Ao ectatic dil - 83mm.    Coronary artery disease    Dyspnea    Dysrhythmia    GERD (gastroesophageal reflux disease)    Herniated disc, lumbar    Hyperlipidemia    Left wrist fracture    Melanoma ([place]) 05/12/2020   Melanoma IS R post neck, excised 08/10/20   Osteoporosis    Pneumonia due to COVID-19 virus 01/26/2020   Stroke ([place]) 1998   SVT (supraventricular tachycardia) ([place])    a.2008 s/p RFCA for AVNRT; b. 9 & 02/2019 Zio x 2: 1. Avg HR  89, 3 runs NSVT (longest 7 beats), Mobitz 1. 2. Avg HR 89, NSVT x 1 (5 beats), Mobitz 1. No signif arrhythmia.    Past Surgical History:  Procedure Laterality Date   BACK SURGERY  2017   L2-4 laminectomy and foraminal stenosis   BREAST CYST ASPIRATION Left 1977   CARDIAC CATHETERIZATION  2009   [facility] in [place]  2009   CATARACT EXTRACTION, BILATERAL  2010   COLONOSCOPY WITH PROPOFOL N/A 03/21/2018   Procedure: COLONOSCOPY WITH Biopsy;  Surgeon: Lin Landsman, MD;  Location: [place];  Service: Endoscopy;  Laterality: N/A;   COLONOSCOPY WITH PROPOFOL N/A 05/03/2021   Procedure: COLONOSCOPY WITH PROPOFOL;  Surgeon: Lin Landsman, MD;  Location: [facility] ENDOSCOPY;  Service: Gastroenterology;  Laterality: N/A;   CORONARY ANGIOPLASTY     derrick procedure  1977-78   bilateral    ESOPHAGOGASTRODUODENOSCOPY  2013   gastritis; done for complaint of dysphagia   ESOPHAGOGASTRODUODENOSCOPY (EGD) WITH PROPOFOL N/A 03/21/2018   Procedure: ESOPHAGOGASTRODUODENOSCOPY (EGD) WITH Biopsies;  Surgeon: Lin Landsman, MD;  Location: [place];  Service: Endoscopy;  Laterality: N/A;  KEEP THIS PATIENT FIRST   JOINT  REPLACEMENT     LEFT HEART CATH AND CORONARY ANGIOGRAPHY Left 05/02/2019   Procedure: LEFT HEART CATH AND CORONARY ANGIOGRAPHY;  Surgeon: Minna Merritts, MD;  Location: [place] CV LAB;  Service: Cardiovascular;  Laterality: Left;   lymph node removal     neck   MOHS SURGERY  07/2016   BCCA nose   POLYPECTOMY N/A 03/21/2018   Procedure: POLYPECTOMY;  Surgeon: Lin Landsman, MD;  Location: [place];  Service: Endoscopy;  Laterality: N/A;   REPLACEMENT TOTAL KNEE  2013   RT   TOTAL SHOULDER REPLACEMENT  2012   RT     There were no vitals filed for this visit.   Subjective Assessment - 05/07/21 1228     Subjective "I remembered my nose spray today"     Currently in Pain? No/denies                   ADULT SLP TREATMENT - 05/07/21 0001       Treatment Provided   Treatment provided Cognitive-Linquistic      Cognitive-Linquistic Treatment   Treatment focused on Voice;Patient/family/caregiver education    Skilled Treatment Pt was instructed in moderately loud voicing. With maximal faded to minimal cues, pt able to produce sustained "ah" at 84dB for 4.8 seconds with pitch improving to 220 Hz; frequency range during glides (high to low & low to high)232 Hz and 135 Hz; read funcitonal phrases at 85 dB with pitch rising to 225 Hz. Pt reported 7 out of 10 effort level.              SLP Education - 05/07/21 1233     Education Details importance of respiratory support    Person(s) Educated Patient    Methods Explanation;Demonstration;Verbal cues;Handout    Comprehension Verbalized understanding;Returned demonstration;Need further instruction                SLP Long Term Goals - 04/16/21 1549       SLP LONG TERM GOAL #1   Title The patient will demonstrate independent understanding of vocal hygiene concepts and extrinsic laryngeal muscle stretches.      Time 4    Period Weeks    Status New    Target Date 05/14/21      SLP LONG TERM GOAL #2   Title The patient will be independent for abdominal breathing and breath support exercises.    Time 4    Period Weeks    Status New    Target Date 05/14/21              Plan - 05/07/21 1234     Clinical Impression Statement Pt presents iwth improving dysphonia with concentrating on increase respiratory support during phonation. In addition, pt began demonstrating increased self-monitoring and awareness. As such, she was successful in correcting her vocal quality. Finally, pt used water to prevent habitual cough twic during the session. I continue to recommend skilled ST to further train pt in vocal hygiene, improve breath support for voice and reduce laryngeal tension in  order to improve vocal quality, endurance and quality of life.    Speech Therapy Frequency 2x / week    Duration 4 weeks    Treatment/Interventions SLP instruction and feedback;Patient/family education    Potential to Achieve Goals Good    SLP Home Exercise Plan provided, see pt instructions section    Consulted and Agree with Plan of Care Patient  Patient will benefit from skilled therapeutic intervention in order to improve the following deficits and impairments:   Dysphonia    Problem List Patient Active Problem List   Diagnosis Date Noted   Hx of colonic polyps    Polyp of colon    Pulmonary embolus ([place]) 01/31/2020   Atrial flutter ([place]) 01/31/2020   Obesity (BMI 30-39.9) 01/31/2020   Left lower lobe pulmonary nodule 01/29/2020   Chronic obstructive pulmonary disease, unspecified COPD type ([place]) 10/08/2019   Dilated cardiomyopathy ([place]) 05/02/2019   Amaurosis fugax of right eye 01/13/2019   Arm pain, right 11/08/2017   Renal insufficiency 11/08/2017   Tendinitis of right knee 07/03/2017   Lumbar radiculopathy 05/18/2017   Status post hemilaminotomy 05/18/2017   Lumbar degenerative disc disease 05/18/2017   Spinal stenosis of lumbar region without neurogenic claudication 05/18/2017   Atrial tachycardia ([place]) 04/14/2017   SVT (supraventricular tachycardia) ([place]) 04/14/2017   Essential hypertension 04/14/2017   Intermittent asthma without complication 37/62/8315   Osteoporosis 03/21/2017   Benign essential tremor 03/21/2017   Hx of atrioventricular node ablation 02/01/2008   Hyperlipidemia 05/12/2003   Personal history of transient ischemic attack (TIA), and cerebral infarction without residual deficits 04/22/2003   Cameron Katayama B. Rutherford Nail M.S., CCC-SLP, [facility] Speech-Language Pathologist Rehabilitation Services Office [place], [place] 05/07/2021, 12:37 PM  [place] MAIN [facility] SERVICES [address] [place], [place], [address] Phone: [phone]   Fax:  [phone]   Name: Audrey Martinez MRN: [id number] Date of Birth: 1946/02/10

## 2021-05-07 NOTE — Patient Instructions (Signed)
Complete speech drills using 7 out of 10 effort level

## 2021-05-10 ENCOUNTER — Ambulatory Visit (INDEPENDENT_AMBULATORY_CARE_PROVIDER_SITE_OTHER): Payer: Medicare Other | Admitting: Cardiovascular Disease

## 2021-05-10 ENCOUNTER — Other Ambulatory Visit: Payer: Self-pay

## 2021-05-10 ENCOUNTER — Encounter: Payer: Self-pay | Admitting: Cardiovascular Disease

## 2021-05-10 VITALS — BP 130/90 | HR 104 | Ht 66.0 in | Wt 245.0 lb

## 2021-05-10 DIAGNOSIS — I420 Dilated cardiomyopathy: Secondary | ICD-10-CM | POA: Diagnosis not present

## 2021-05-10 DIAGNOSIS — E782 Mixed hyperlipidemia: Secondary | ICD-10-CM | POA: Diagnosis not present

## 2021-05-10 DIAGNOSIS — I10 Essential (primary) hypertension: Secondary | ICD-10-CM | POA: Diagnosis not present

## 2021-05-10 DIAGNOSIS — I5020 Unspecified systolic (congestive) heart failure: Secondary | ICD-10-CM | POA: Diagnosis not present

## 2021-05-10 DIAGNOSIS — I4729 Other ventricular tachycardia: Secondary | ICD-10-CM | POA: Diagnosis not present

## 2021-05-10 DIAGNOSIS — I471 Supraventricular tachycardia, unspecified: Secondary | ICD-10-CM

## 2021-05-10 DIAGNOSIS — I4719 Other supraventricular tachycardia: Secondary | ICD-10-CM

## 2021-05-10 DIAGNOSIS — I2699 Other pulmonary embolism without acute cor pulmonale: Secondary | ICD-10-CM

## 2021-05-10 MED ORDER — CARVEDILOL 12.5 MG PO TABS: 12.5000 mg | ORAL_TABLET | Freq: Two times a day (BID) | ORAL | 3 refills | Status: DC

## 2021-05-10 NOTE — Progress Notes (Signed)
Cardiology Office Note  Date:  05/10/2021   ID:  Audrey Martinez, Audrey Martinez [birth year], MRN [id number]  PCP:  Audrey Hess, MD   Chief Complaint  Patient presents with   Follow up [facility] ER; palpitations     Patient c/o rapid heartbeats at times. Medications reviewed by the patient verbally.     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  Non-smoker  orthostasis Previous migraines Nonischemic cardiomyopathy ejection fraction 30 to 35% November 2020 Catheterization with nonobstructive coronary disease Ejection fraction remaining 30 to 35% in August 2021  COVID infection  in sept 2021 Bilateral pulmonary emboli, without right heart strain. Who presents for f/u of her atrial tachycardia, SVT, EF 30 to 35%, possible hypertensive cardiomyopathy, medication intolerances  Last seen in clinic by myself April 2022  Having more tachycardia Taking coreg 3.125 Put herself back on diltiazem ER 180.  Reports that she had some leftover She was seen in the emergency room November 2022 for tachycardia Does not appear that medication changes were made at that time  No regular exercise program [place] Does not walk the dogs very much Goes to [employer] occasionally  Unsteady gait, uses a cane Has a glider for exercise  Lives alone, son helps Does her ADLs  Denies significant leg swelling, no shortness of breath, no chest pain  EKG personally reviewed by myself on todays visit Sinus tach rate 104 bpm  IVCD  QRS slightly longer on today's EKG  Other past medical history reviewed prior fall, mechanical ,  Hit chin, significant trauma, concussion In the ER did CT scan, right 3mm tentorial SDH after a mechanical fall at home.  Seen by neurology  echo August 2021  ejection fraction 30 to 35%, same as 2020, normal right heart pressures  Echocardiogram April 17, 2019  with ejection fraction 30 to 35%, normal RV function  Cardiac catheterization performed for cardiomyopathy  Catheterization December 2020 Nonobstructive CAD, EF estimated at 30 to 35%, global hypokinesis,  LVEDP 17 to 20 mm Hg  episode of tachycardia, went to the ER 06/03/2018 Rhythm broke before she could get an EKG in the emergency room Rare episodes, does not remember the last time she had an episode  Right eye vision went dark, 01/02/2019 seen in the ER MRI in the ER, no CVA CT head Encephalomalacia changes in the left frontal region. Left eye: darkness "came down a bit"  zio monitor,  Normal sinus rhythm  avg HR of 89 bpm. 3 Ventricular Tachycardia runs occurred, the run with the fastest interval lasting 5 beats with a max rate of 200 bpm, the longest lasting 7 beats with an avg rate of 141 bpm.  Patient triggered events were not associated with significant arrhythmia.  Carotid: Minor carotid atherosclerosis. No hemodynamically significant ICA stenosis. Degree of narrowing less than 50% bilaterally by ultrasound criteria.   Echocardiogram May 18, 2018 Ejection fraction 45 to 50% mild diffuse hypokinesis unable to exclude regional wall motion abnormalities Concerning for anteroseptal and apical hypokinesis   Lab Results  Component Value Date   CHOL 113 01/30/2020   HDL 40 (L) 01/30/2020   LDLCALC 68 01/30/2020   TRIG 27 01/30/2020    Notes from [place] cardiologist Notes indicate 1. Dual AV nodal physiology. 2. Typical  slow fast AV nodal reentrant tachycardia. 3. Nonsustained AT / No inducible atrial flutter  She underwent s/p Successful radiofrequency ablation of AV nodal slow pathway.   She had recurrent symptoms shortly after, had event monitor  repeat EPS 01/21/08:  1. Markedly prolonged baseline AH interval (AV nodal function is subnormal)  2. No  induceable tachycardia; Only induceable double echo beats As per EP,  event monitor with ectopic atrial tachycardia versus atrial flutter with 2:1 block.   Notes indicating she did not want anticoagulation  PRIOR CARDIAC STUDIES: -ECHOCARDIOGRAM 12/13/06: LVEF 55-60%. No significant valvular abnormalities. Noted to have episodes of atrial flutter at controlled rate. -ETT 09/28/07: Bruce protocol 5:01 minutes. 7 METS. Maximal HR 179 bpm (111% MPHR). Pretest ECG normal with HR 96 bpm. No chest pain. No significant ischemia.  -STRESS ECHO 11/14/08: Bruce protocol 4:31 minutes. 4.6 METS. 113% MPHR. LVEF 60%. No significant ischemia.  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 -Hypertension -Hyperlipidemia -History of TIA -Asthma -History of migraines -Right cataract and history of right retinal bleeding over a year ago, now resolved -S/P umbilical hernia repair -S/P tonsillectomy -S/P multiple wrist surgeries -S/P excision of right supraclavicular neck mass (lymph node) -S/P cataract surgery   PMH:   has a past medical history of Actinic keratosis, Acute pulmonary embolus ([place]) (01/29/2020), Acute renal failure (ARF) ([place]) (01/31/2020), Acute respiratory failure with hypoxia ([place]) (01/29/2020), Acute septic pulmonary embolism ([place]), AKI (acute kidney injury) ([place]), Amaurosis fugax, right eye (12/2018), Arthritis, Asthma, Basal cell carcinoma (07/2016), Cardiomyopathy ([place]), Coronary artery disease, Dyspnea, Dysrhythmia, GERD (gastroesophageal reflux disease), Herniated disc, lumbar, Hyperlipidemia, Left wrist fracture, Melanoma ([place]) (05/12/2020), Osteoporosis, Pneumonia due to COVID-19 virus (01/26/2020), Stroke ([place]) (1998), and SVT (supraventricular tachycardia) ([place]).  PSH:    Past Surgical History:  Procedure Laterality Date   BACK SURGERY  2017   L2-4 laminectomy and foraminal stenosis   BREAST CYST ASPIRATION Left 1977   CARDIAC CATHETERIZATION  2009   [place] in  [place]  2009   CATARACT EXTRACTION, BILATERAL  2010   COLONOSCOPY WITH PROPOFOL N/A 03/21/2018   Procedure: COLONOSCOPY WITH Biopsy;  Surgeon: Lin Landsman, MD;  Location: [place];  Service: Endoscopy;  Laterality: N/A;   COLONOSCOPY WITH PROPOFOL N/A 05/03/2021   Procedure: COLONOSCOPY WITH PROPOFOL;  Surgeon: Lin Landsman, MD;  Location: [facility] ENDOSCOPY;  Service: Gastroenterology;  Laterality: N/A;   CORONARY ANGIOPLASTY     derrick procedure  1977-78   bilateral    ESOPHAGOGASTRODUODENOSCOPY  2013   gastritis; done for complaint of dysphagia   ESOPHAGOGASTRODUODENOSCOPY (EGD) WITH PROPOFOL N/A 03/21/2018   Procedure: ESOPHAGOGASTRODUODENOSCOPY (EGD) WITH Biopsies;  Surgeon: Lin Landsman, MD;  Location: [place];  Service: Endoscopy;  Laterality: N/A;  KEEP THIS PATIENT FIRST   JOINT REPLACEMENT     LEFT HEART CATH AND CORONARY ANGIOGRAPHY Left 05/02/2019   Procedure: LEFT HEART CATH AND CORONARY ANGIOGRAPHY;  Surgeon: Minna Merritts, MD;  Location: [place] CV LAB;  Service: Cardiovascular;  Laterality: Left;   lymph node removal     neck   MOHS SURGERY  07/2016   BCCA nose   POLYPECTOMY N/A 03/21/2018   Procedure: POLYPECTOMY;  Surgeon: Lin Landsman, MD;  Location: [place];  Service: Endoscopy;  Laterality: N/A;   REPLACEMENT TOTAL KNEE  2013   RT   TOTAL SHOULDER REPLACEMENT  2012   RT     Current Outpatient Medications  Medication Sig Dispense Refill   albuterol (PROVENTIL HFA;VENTOLIN HFA) 108 (90 Base) MCG/ACT inhaler Inhale 1-2 puffs into the lungs every 6 (six) hours as needed for wheezing or shortness of breath.      B Complex-C (B-COMPLEX WITH VITAMIN C) tablet Take 1 tablet by mouth daily.      baclofen (LIORESAL) 10 MG tablet TAKE 1 TABLET BY MOUTH THREE TIMES A DAY 270 tablet 0   Bioflavonoid Products (ESTER-C) TABS Take 1 tablet by  mouth 2 (two) times daily.      calcium carbonate (OS-CAL - DOSED IN MG OF ELEMENTAL CALCIUM) 1250 (500 Ca) MG tablet Take 1 tablet by mouth.     carvedilol (COREG) 3.125 MG tablet TAKE 1 TABLET (3.125 MG TOTAL) BY MOUTH 2 (TWO) TIMES DAILY WITH A MEAL. 180 tablet 3   cholecalciferol (VITAMIN D) 25 MCG (1000 UT) tablet Take 1,000 Units by mouth daily.     Cranberry 1000 MG CAPS Take 1,000 mg by mouth daily.      diltiazem (CARDIZEM CD) 180 MG 24 hr capsule Take 180 mg by mouth daily as needed.     famotidine (PEPCID) 20 MG tablet Take 1 tablet (20 mg total) by mouth 2 (two) times daily for 10 days. 20 tablet 0   fluticasone (FLOVENT HFA) 110 MCG/ACT inhaler Inhale 2 puffs into the lungs 2 (two) times daily. 36 g 0   gabapentin (NEURONTIN) 300 MG capsule TAKE 1 CAPSULE BY MOUTH 3 TIMES DAILY. AND TAKE 3 CAPSULES DAILY AT BEDTIME. 540 capsule 1   Ginger, Zingiber officinalis, (GINGER PO) Take 1,100 mg by mouth daily.      ipratropium (ATROVENT) 0.03 % nasal spray SMARTSIG:1-2 Spray(s) Both Nares 3 Times Daily     MAG ASPART-POTASSIUM ASPART PO Take 1 tablet by mouth 2 (two) times daily. 600 mg-198 mg     Misc Natural Products (TART CHERRY ADVANCED PO) Take 1 capsule by mouth daily.      montelukast (SINGULAIR) 10 MG tablet Take 10 mg by mouth every evening.     Multiple Vitamins-Minerals (MULTIVITAMIN WITH MINERALS) tablet Take 1 tablet by mouth daily. Centrum Silver     Olodaterol HCl (STRIVERDI RESPIMAT) 2.5 MCG/ACT AERS Inhale 2 puffs into the lungs daily. USE AT THE SAME TIME EVERYDAY 12 g 2   Probiotic Product (PROBIOTIC DAILY PO) Take 1 capsule by mouth 2 (two) times a week.      rivaroxaban (XARELTO) 20 MG TABS tablet Take 1 tablet (20 mg total) by mouth as needed (PRN for long car or plane ride). 30 tablet 3   sacubitril-valsartan (ENTRESTO) 24-26 MG Take 1 tablet by mouth 2 (two) times daily. 180 tablet 3   Turmeric 500 MG CAPS Take 500 mg by mouth daily.      zinc sulfate 220 (50 Zn) MG  capsule Take 1 capsule (220 mg total) by mouth daily. 30 capsule 0   predniSONE (DELTASONE) 10 MG tablet Take 40 mg on day 1 and decrease by 10mg  each day -- 40, then 30, then 20, then 10 for a total of 4 days. (Patient not taking: Reported on 04/15/2021) 10 tablet 0   No current facility-administered medications for this visit.     Allergies:   Amoxicillin, Aspirin, Ciprofloxacin, Ciprofloxacin-dexamethasone, Crestor [rosuvastatin], Morphine and related, Doxycycline hyclate, Hydrochlorothiazide, Keflex [cephalexin], Lovastatin, Simvastatin, Erythromycin, Ibuprofen, Neomycin-bacitracin zn-polymyx, Omeprazole, and Tape   Social History:  The patient  reports that she has never smoked. She has never used smokeless tobacco. She  reports that she does not currently use alcohol. She reports that she does not use drugs.   Family History:   family history includes CAD (age of onset: 41) in her sister; Clotting disorder in her half-sister and half-sister; Diabetes in her mother; Stroke in her mother and sister; Stroke (age of onset: 46) in her sister; Valvular heart disease in her father.    Review of Systems: Review of Systems  Constitutional: Negative.   HENT: Negative.    Respiratory: Negative.    Cardiovascular: Negative.   Gastrointestinal: Negative.   Musculoskeletal: Negative.   Psychiatric/Behavioral: Negative.    All other systems reviewed and are negative.  PHYSICAL EXAM: VS:  BP 130/90 (BP Location: Left Arm, Patient Position: Sitting, Cuff Size: Large)   Pulse (!) 104   Ht 5\' 6"  (1.676 m)   Wt 245 lb (111.1 kg)   SpO2 98%   BMI 39.54 kg/m  , BMI Body mass index is 39.54 kg/m. Constitutional:  oriented to person, place, and time. No distress.  HENT:  Head: Grossly normal Eyes:  no discharge. No scleral icterus.  Neck: No JVD, no carotid bruits  Cardiovascular: Regular rate and rhythm, no murmurs appreciated Pulmonary/Chest: Clear to auscultation bilaterally, no wheezes or  rails Abdominal: Soft.  no distension.  no tenderness.  Musculoskeletal: Normal range of motion Neurological:  normal muscle tone. Coordination normal. No atrophy Skin: Skin warm and dry Psychiatric: normal affect, pleasant  Recent Labs: 01/22/2021: ALT 20 03/31/2021: BUN 14; Creatinine, Ser 1.24; Hemoglobin 15.9; Magnesium 2.6; Platelets 250; Potassium 4.1; Sodium 142    Lipid Panel Lab Results  Component Value Date   CHOL 113 01/30/2020   HDL 40 (L) 01/30/2020   LDLCALC 68 01/30/2020   TRIG 27 01/30/2020      Wt Readings from Last 3 Encounters:  05/10/21 245 lb (111.1 kg)  05/03/21 244 lb (110.7 kg)  04/26/21 245 lb 6.4 oz (111.3 kg)     ASSESSMENT AND PLAN:  covid 19 pneumonia, Hospitalized Sept 2021 Bilateral PE, on xarelto,  Xarelto PRN for long car trips, 10 mg Recommend she stay active, compression hose if possible  SVT (supraventricular tachycardia) ([place]) -  Prior ablation  Bothered by tachycardia Recommend she increase carvedilol up to 12.5 twice daily  Cardiomyopathy, dilated Nonischemic Ejection fraction 35% Recommend she stop diltiazem, will increase Coreg up to 12.5 twice daily, continue  Entresto 24/26 mg twice daily In follow-up consider adding spironolactone 25 mg daily and or Jardiance 10 mg daily  Atrial tachycardia ([place]) -  Prior monitor showing short run VT and PVCs In the emergency room November 2022, Increase Coreg as above  Mixed hyperlipidemia - She does not want a statin  Zetia may have caused her eye problems Repeat lipid panel ordered Curious that 2021 numbers were very low, she reported having high numbers in the past  Personal history of transient ischemic attack (TIA), and cerebral infarction without residual deficits Does not want aspirin, Plavix  No documentation of atrial fibrillation or flutter She prefers Xarelto as needed  Reactive airway disease/asthma On inhalers, Recovered from COVID No recurrence of her  symptoms   Total encounter time more than 25 minutes  Greater than 50% was spent in counseling and coordination of care with the patient   Orders Placed This Encounter  Procedures   EKG 12-Lead      Signed, Esmond Plants, M.D., Ph.D. 05/10/2021  [place], [place] [phone]

## 2021-05-10 NOTE — Patient Instructions (Addendum)
Medication Instructions:   Stop diltiazem Increase the carvedilol up to 12.5 mg twice a day  If you need a refill on your cardiac medications before your next appointment, please call your pharmacy.    Lab work: lipids  Testing/Procedures: No new testing needed  Follow-Up: At [facility], you and your health needs are our priority.  As part of our continuing mission to provide you with exceptional heart care, we have created designated Provider Care Teams.  These Care Teams include your primary Cardiologist (physician) and Advanced Practice Providers (APPs -  Physician Assistants and Nurse Practitioners) who all work together to provide you with the care you need, when you need it.  You will need a follow up appointment in 6 months  Providers on your designated Care Team:   Murray Hodgkins, NP Christell Faith, PA-C Cadence Kathlen Mody, [place]  COVID-19 Vaccine Information can be found at: [email or web address].uk For questions related to vaccine distribution or appointments, please email [email or web address] or call 9067856353.

## 2021-05-11 ENCOUNTER — Ambulatory Visit: Payer: Medicare Other | Admitting: Speech Pathology

## 2021-05-11 ENCOUNTER — Telehealth: Payer: Self-pay

## 2021-05-11 DIAGNOSIS — R490 Dysphonia: Secondary | ICD-10-CM

## 2021-05-11 LAB — LIPID PANEL
Chol/HDL Ratio: 3 ratio (ref 0.0–4.4)
Cholesterol, Total: 225 mg/dL — ABNORMAL HIGH (ref 100–199)
HDL: 75 mg/dL (ref >39)
LDL Chol Calc (NIH): 131 mg/dL — ABNORMAL HIGH (ref 0–99)
Triglycerides: 112 mg/dL (ref 0–149)
VLDL Cholesterol Cal: 19 mg/dL (ref 5–40)

## 2021-05-11 MED ORDER — ROSUVASTATIN CALCIUM 5 MG PO TABS: 5.0000 mg | ORAL_TABLET | Freq: Every day | ORAL | 3 refills | Status: DC

## 2021-05-11 NOTE — Telephone Encounter (Signed)
Able to reach pt regarding her recent lab work, Dr. Rockey Situ had a chance to review their results and advised   "Lipids  Cholesterol is elevated  Unclear why it was low but now high  Will defer to her, options are statins such as Crestor 5 mg daily, or retry Zetia 10 mg daily "  Mrs. Rettinger would like to defer Zetia d/t past hx of "Blurry eyes, felt funny, memory problems" when last taken in 03/2020. Would like to retry Crestor 5 mg daily, if having side affects will call office to advise. Otherwise, all questions were address and no additional concerns at this time. Agreeable to plan, will call back for anything further.    Crestor 5 mg daily sent to [facility]

## 2021-05-12 NOTE — Therapy (Signed)
[place] MAIN [facility] SERVICES [address] [place], [place], [address] Phone: [phone]   Fax:  [phone]  Speech Language Pathology Treatment  Patient Details  Name: Audrey Martinez MRN: [id number] Date of Birth: Apr 18, 1946 Referring Provider (SLP): Carloyn Manner   Encounter Date: 05/11/2021   End of Session - 05/12/21 1047     Visit Number 5    Number of Visits 8    Date for SLP Re-Evaluation 05/14/21    Authorization Type Medicare    Authorization Time Period 04/15/2021 thru 05/14/2021    Authorization - Visit Number 4    Progress Note Due on Visit 10    SLP Start Time 1000    SLP Stop Time  1100    SLP Time Calculation (min) 60 min    Activity Tolerance Patient tolerated treatment well             Past Medical History:  Diagnosis Date   Actinic keratosis    Acute pulmonary embolus ([place]) 01/29/2020   Acute renal failure (ARF) ([place]) 01/31/2020   Acute respiratory failure with hypoxia ([place]) 01/29/2020   Acute septic pulmonary embolism ([facility])    AKI (acute kidney injury) ([place])    Amaurosis fugax, right eye 12/2018   Arthritis    Asthma    Basal cell carcinoma 07/2016   central forehead (tx in [place])   Cardiomyopathy [facility])    a. 04/2018 Echo: EF 45-50%. Anteroseptal and apical HK in some views. Mild AI/MR. Nl RV fxn; b. 05/2018 MV: mid-dist ant and antsept mild defect - ? breast atten. EF 41%. No ischemia; c. 03/2019 Echo: EF 30-35%, Gr2 DD, Trace MR, triv TR. Asc Ao ectatic dil - 68mm.    Coronary artery disease    Dyspnea    Dysrhythmia    GERD (gastroesophageal reflux disease)    Herniated disc, lumbar    Hyperlipidemia    Left wrist fracture    Melanoma ([place]) 05/12/2020   Melanoma IS R post neck, excised 08/10/20   Osteoporosis    Pneumonia due to COVID-19 virus 01/26/2020   Stroke ([place]) 1998   SVT (supraventricular tachycardia) ([place])    a.2008 s/p RFCA for AVNRT; b. 9 & 02/2019 Zio x 2: 1. Avg HR  89, 3 runs NSVT (longest 7 beats), [place] 1. 2. Avg HR 89, NSVT x 1 (5 beats), [place] 1. No signif arrhythmia.    Past Surgical History:  Procedure Laterality Date   BACK SURGERY  2017   L2-4 laminectomy and foraminal stenosis   BREAST CYST ASPIRATION Left 1977   CARDIAC CATHETERIZATION  2009   [facility] in [place]  2009   CATARACT EXTRACTION, BILATERAL  2010   COLONOSCOPY WITH PROPOFOL N/A 03/21/2018   Procedure: COLONOSCOPY WITH Biopsy;  Surgeon: Lin Landsman, MD;  Location: [place];  Service: Endoscopy;  Laterality: N/A;   COLONOSCOPY WITH PROPOFOL N/A 05/03/2021   Procedure: COLONOSCOPY WITH PROPOFOL;  Surgeon: Lin Landsman, MD;  Location: [facility] ENDOSCOPY;  Service: Gastroenterology;  Laterality: N/A;   CORONARY ANGIOPLASTY     derrick procedure  1977-78   bilateral    ESOPHAGOGASTRODUODENOSCOPY  2013   gastritis; done for complaint of dysphagia   ESOPHAGOGASTRODUODENOSCOPY (EGD) WITH PROPOFOL N/A 03/21/2018   Procedure: ESOPHAGOGASTRODUODENOSCOPY (EGD) WITH Biopsies;  Surgeon: Lin Landsman, MD;  Location: [place];  Service: Endoscopy;  Laterality: N/A;  KEEP THIS PATIENT FIRST   JOINT  REPLACEMENT     LEFT HEART CATH AND CORONARY ANGIOGRAPHY Left 05/02/2019   Procedure: LEFT HEART CATH AND CORONARY ANGIOGRAPHY;  Surgeon: Minna Merritts, MD;  Location: [place] CV LAB;  Service: Cardiovascular;  Laterality: Left;   lymph node removal     neck   MOHS SURGERY  07/2016   BCCA nose   POLYPECTOMY N/A 03/21/2018   Procedure: POLYPECTOMY;  Surgeon: Lin Landsman, MD;  Location: [place];  Service: Endoscopy;  Laterality: N/A;   REPLACEMENT TOTAL KNEE  2013   RT   TOTAL SHOULDER REPLACEMENT  2012   RT     There were no vitals filed for this visit.   Subjective Assessment - 05/12/21 0848     Subjective "I sang in the choir on Sunday and is  sounded good"    Currently in Pain? No/denies                   ADULT SLP TREATMENT - 05/12/21 0001       Treatment Provided   Treatment provided Cognitive-Linquistic      Cognitive-Linquistic Treatment   Treatment focused on Voice;Patient/family/caregiver education    Skilled Treatment Pt arrived with great vocal quality in reception area as well as when walking down hall but once in therapy room pt had some initial coughing/throat clear/gravely voice. Instructed pt in stretching, abdominal breathing, sips of water to be able to produce sustained ?ah/ with great intensity, quality and duration; Mod I with using a loud good quality voice to produce patient centered functional phrases (75dB) as well as sentence level functional phrases (80dB); increased cognitive load with Festive Trivia Quiz Questions - pt able to maintain good quality voicing at the connected sentence level              SLP Education - 05/12/21 0850     Education Details strategies to increse vocal quality post coughing    Person(s) Educated Patient    Methods Explanation;Demonstration;Verbal cues    Comprehension Verbalized understanding;Returned demonstration                SLP Long Term Goals - 04/16/21 1549       SLP LONG TERM GOAL #1   Title The patient will demonstrate independent understanding of vocal hygiene concepts and extrinsic laryngeal muscle stretches.      Time 4    Period Weeks    Status New    Target Date 05/14/21      SLP LONG TERM GOAL #2   Title The patient will be independent for abdominal breathing and breath support exercises.    Time 4    Period Weeks    Status New    Target Date 05/14/21              Plan - 05/12/21 1048     Clinical Impression Statement Pt presents with independent ability to produce a good quality loud voice that remained even when cognitive load increased in the session. She eagerly completed her HEP and the results were readily  visible even when walking down hallway.I recommend skilled ST to train pt in vocal hygiene, improve breath support for voice and reduce laryngeal tension in order to improve vocal quality, endurance, and quality of life.    Speech Therapy Frequency 2x / week    Duration 4 weeks    Treatment/Interventions SLP instruction and feedback;Patient/family education    Potential to Achieve Goals Good    SLP Home Exercise  Plan provided, see pt instructions section    Consulted and Agree with Plan of Care Patient             Patient will benefit from skilled therapeutic intervention in order to improve the following deficits and impairments:   Dysphonia    Problem List Patient Active Problem List   Diagnosis Date Noted   Hx of colonic polyps    Polyp of colon    Pulmonary embolus ([place]) 01/31/2020   Atrial flutter ([place]) 01/31/2020   Obesity (BMI 30-39.9) 01/31/2020   Left lower lobe pulmonary nodule 01/29/2020   Chronic obstructive pulmonary disease, unspecified COPD type ([place]) 10/08/2019   Dilated cardiomyopathy ([place]) 05/02/2019   Amaurosis fugax of right eye 01/13/2019   Arm pain, right 11/08/2017   Renal insufficiency 11/08/2017   Tendinitis of right knee 07/03/2017   Lumbar radiculopathy 05/18/2017   Status post hemilaminotomy 05/18/2017   Lumbar degenerative disc disease 05/18/2017   Spinal stenosis of lumbar region without neurogenic claudication 05/18/2017   Atrial tachycardia ([place]) 04/14/2017   SVT (supraventricular tachycardia) ([place]) 04/14/2017   Essential hypertension 04/14/2017   Intermittent asthma without complication 48/05/6551   Osteoporosis 03/21/2017   Benign essential tremor 03/21/2017   Hx of atrioventricular node ablation 02/01/2008   Hyperlipidemia 05/12/2003   Personal history of transient ischemic attack (TIA), and cerebral infarction without residual deficits 04/22/2003   Felicia Bloomquist B. Rutherford Nail M.S., CCC-SLP, [facility] Pathologist Rehabilitation  Services Office [phone], [place] 05/12/2021, 10:50 AM  [place] MAIN [facility] SERVICES [address] [place], [place], [address] Phone: [phone]   Fax:  [phone]   Name: Audrey Martinez MRN: [id number] Date of Birth: 1945-06-07

## 2021-05-12 NOTE — Patient Instructions (Signed)
Using a moderately loud voice - practice hierarchical speech drills

## 2021-05-13 ENCOUNTER — Other Ambulatory Visit: Payer: Self-pay

## 2021-05-13 ENCOUNTER — Ambulatory Visit: Payer: Medicare Other | Admitting: Speech Pathology

## 2021-05-13 DIAGNOSIS — R490 Dysphonia: Secondary | ICD-10-CM

## 2021-05-13 NOTE — Therapy (Addendum)
[place] MAIN [facility] SERVICES [address] [place], [place], [address] Phone: [phone]   Fax:  [phone]  Speech Language Pathology Treatment DISCHARGE SUMMARY  Patient Details  Name: Audrey Martinez MRN: [id number] Date of Birth: 1946-03-30 Referring Provider (SLP): Carloyn Manner   Encounter Date: 05/13/2021   End of Session - 05/13/21 1721     Visit Number 6    Number of Visits 8    Date for SLP Re-Evaluation 05/14/21    Authorization Type Medicare    Authorization Time Period 04/15/2021 thru 05/14/2021    Authorization - Visit Number 5    Progress Note Due on Visit 10    SLP Start Time 1000    SLP Stop Time  1100    SLP Time Calculation (min) 60 min    Activity Tolerance Patient tolerated treatment well             Past Medical History:  Diagnosis Date   Actinic keratosis    Acute pulmonary embolus ([place]) 01/29/2020   Acute renal failure (ARF) ([place]) 01/31/2020   Acute respiratory failure with hypoxia ([place]) 01/29/2020   Acute septic pulmonary embolism (HCC)    AKI (acute kidney injury) ([place])    Amaurosis fugax, right eye 12/2018   Arthritis    Asthma    Basal cell carcinoma 07/2016   central forehead (tx in [place])   Cardiomyopathy [facility])    a. 04/2018 Echo: EF 45-50%. Anteroseptal and apical HK in some views. Mild AI/MR. Nl RV fxn; b. 05/2018 MV: mid-dist ant and antsept mild defect - ? breast atten. EF 41%. No ischemia; c. 03/2019 Echo: EF 30-35%, Gr2 DD, Trace MR, triv TR. Asc Ao ectatic dil - 39mm.    Coronary artery disease    Dyspnea    Dysrhythmia    GERD (gastroesophageal reflux disease)    Herniated disc, lumbar    Hyperlipidemia    Left wrist fracture    Melanoma ([place]) 05/12/2020   Melanoma IS R post neck, excised 08/10/20   Osteoporosis    Pneumonia due to COVID-19 virus 01/26/2020   Stroke ([place]) 1998   SVT (supraventricular tachycardia) ([place])    a.2008 s/p RFCA for AVNRT; b. 9 & 02/2019  Zio x 2: 1. Avg HR 89, 3 runs NSVT (longest 7 beats), Mobitz 1. 2. Avg HR 89, NSVT x 1 (5 beats), Mobitz 1. No signif arrhythmia.    Past Surgical History:  Procedure Laterality Date   BACK SURGERY  2017   L2-4 laminectomy and foraminal stenosis   BREAST CYST ASPIRATION Left 1977   CARDIAC CATHETERIZATION  2009   [facility] [place]  2009   CATARACT EXTRACTION, BILATERAL  2010   COLONOSCOPY WITH PROPOFOL N/A 03/21/2018   Procedure: COLONOSCOPY WITH Biopsy;  Surgeon: Lin Landsman, MD;  Location: [place];  Service: Endoscopy;  Laterality: N/A;   COLONOSCOPY WITH PROPOFOL N/A 05/03/2021   Procedure: COLONOSCOPY WITH PROPOFOL;  Surgeon: Lin Landsman, MD;  [facility] ENDOSCOPY;  Service: Gastroenterology;  Laterality: N/A;   CORONARY ANGIOPLASTY     derrick procedure  1977-78   bilateral    ESOPHAGOGASTRODUODENOSCOPY  2013   gastritis; done for complaint of dysphagia   ESOPHAGOGASTRODUODENOSCOPY (EGD) WITH PROPOFOL N/A 03/21/2018   Procedure: ESOPHAGOGASTRODUODENOSCOPY (EGD) WITH Biopsies;  Surgeon: Lin Landsman, MD;  Location: [place];  Service: Endoscopy;  Laterality: N/A;  KEEP THIS PATIENT FIRST  JOINT REPLACEMENT     LEFT HEART CATH AND CORONARY ANGIOGRAPHY Left 05/02/2019   Procedure: LEFT HEART CATH AND CORONARY ANGIOGRAPHY;  Surgeon: Minna Merritts, MD;  Location: [place] CV LAB;  Service: Cardiovascular;  Laterality: Left;   lymph node removal     neck   MOHS SURGERY  07/2016   BCCA nose   POLYPECTOMY N/A 03/21/2018   Procedure: POLYPECTOMY;  Surgeon: Lin Landsman, MD;  Location: [place];  Service: Endoscopy;  Laterality: N/A;   REPLACEMENT TOTAL KNEE  2013   RT   TOTAL SHOULDER REPLACEMENT  2012   RT     There were no vitals filed for this visit.   Subjective Assessment - 05/13/21 1717     Subjective "I am doing so well"     Currently in Pain? No/denies                   ADULT SLP TREATMENT - 05/13/21 0001       Treatment Provided   Treatment provided Cognitive-Linquistic      Cognitive-Linquistic Treatment   Treatment focused on Voice;Patient/family/caregiver education    Skilled Treatment Pt arrived with great vocal quality even when walking down hallway to [place] office. Skilled treatment session focused on vocal hygiene. Independent self-monitoring of throat clearing with independent use of alternative strategies; SLP provided formal instruction with handouts on Throat Clearing and Lifestyle, Hydration and Managing Mucus, Do's and Don'ts for Maximizing Your Voice; no vocal dysfunction observed during today's session              SLP Education - 05/13/21 1720     Education Details vocal hygiene, recoveyr strategies    Person(s) Educated Patient    Methods Explanation;Demonstration;Verbal cues;Handout    Comprehension Verbalized understanding;Returned demonstration                SLP Long Term Goals - 05/13/21 1724       SLP LONG TERM GOAL #1   Title The patient will demonstrate independent understanding of vocal hygiene concepts and extrinsic laryngeal muscle stretches.      Status Achieved      SLP LONG TERM GOAL #2   Title The patient will be independent for abdominal breathing and breath support exercises.    Status Achieved              Plan - 05/13/21 1721     Clinical Impression Statement Pt has responded very well to "refresher course" of voice therapy. She has readily implemented all strategies and has improved her vocal quality. loudness and overall hygiene. At this time, pt has meet all of her goals, all education has been completed and pt states that she is "extremely pleased" with her voice at this time. Pt is appropriate for discharge from [place] services at this time.    Potential to Achieve Goals Good    Consulted and Agree with Plan of Care Patient              Problem List Patient Active Problem List   Diagnosis Date Noted   Hx of colonic polyps    Polyp of colon    Pulmonary embolus ([place]) 01/31/2020   Atrial flutter ([place]) 01/31/2020   Obesity (BMI 30-39.9) 01/31/2020   Left lower lobe pulmonary nodule 01/29/2020   Chronic obstructive pulmonary disease, unspecified COPD type ([place]) 10/08/2019   Dilated cardiomyopathy ([place]) 05/02/2019   Amaurosis fugax of right eye 01/13/2019   Arm pain, right 11/08/2017  Renal insufficiency 11/08/2017   Tendinitis of right knee 07/03/2017   Lumbar radiculopathy 05/18/2017   Status post hemilaminotomy 05/18/2017   Lumbar degenerative disc disease 05/18/2017   Spinal stenosis of lumbar region without neurogenic claudication 05/18/2017   Atrial tachycardia ([place]) 04/14/2017   SVT (supraventricular tachycardia) ([place]) 04/14/2017   Essential hypertension 04/14/2017   Intermittent asthma without complication 14/44/5848   Osteoporosis 03/21/2017   Benign essential tremor 03/21/2017   Hx of atrioventricular node ablation 02/01/2008   Hyperlipidemia 05/12/2003   Personal history of transient ischemic attack (TIA), and cerebral infarction without residual deficits 04/22/2003   Kenan Moodie B. Rutherford Nail M.S., CCC-SLP, [facility] Speech-Language Pathologist Rehabilitation Services Office [phone], [place] 05/13/2021, 5:24 PM  [place] MAIN [facility] SERVICES [address] [place], [place], [address] Phone: [phone]   Fax:  [phone]   Name: Audrey Martinez MRN: [id number] Date of Birth: 07-31-1945

## 2021-05-18 ENCOUNTER — Encounter: Payer: Medicare Other | Admitting: Speech Pathology

## 2021-05-18 ENCOUNTER — Ambulatory Visit: Payer: Medicare Other | Admitting: Dermatology

## 2021-05-18 DIAGNOSIS — M7061 Trochanteric bursitis, right hip: Secondary | ICD-10-CM | POA: Insufficient documentation

## 2021-05-18 DIAGNOSIS — M25551 Pain in right hip: Secondary | ICD-10-CM | POA: Diagnosis not present

## 2021-05-19 ENCOUNTER — Other Ambulatory Visit: Payer: Self-pay | Admitting: Internal Medicine

## 2021-05-19 NOTE — Telephone Encounter (Signed)
Requested medication (s) are due for refill today: no  Requested medication (s) are on the active medication list: yes  Last refill:  03/22/21 #270/0RF  Future visit scheduled: yes  Notes to clinic:  Unable to refill per protocol, cannot delegate.      Requested Prescriptions  Pending Prescriptions Disp Refills   baclofen (LIORESAL) 10 MG tablet [Pharmacy Med Name: BACLOFEN 10 MG TABLET] 270 tablet 0    Sig: TAKE 1 TABLET BY MOUTH THREE TIMES A DAY     Not Delegated - Analgesics:  Muscle Relaxants Failed - 05/19/2021 10:24 AM      Failed - This refill cannot be delegated      Failed - Valid encounter within last 6 months    Recent Outpatient Visits           1 year ago Essential hypertension   [facility] Glean Hess, MD   1 year ago Diarrhea, unspecified type   [facility] Glean Hess, MD   1 year ago Essential hypertension   [facility] Glean Hess, MD   1 year ago Pain of right thigh   [facility] Glean Hess, MD   2 years ago Essential hypertension   [facility] Glean Hess, MD       Future Appointments             In 1 month Army Melia Jesse Sans, MD [facility], [place]   In 2 months Brendolyn Patty, MD [facility]   In 5 months [place], Kathlene November, MD [facility], [place]

## 2021-05-20 ENCOUNTER — Encounter: Payer: 59 | Admitting: Speech Pathology

## 2021-05-26 ENCOUNTER — Encounter: Payer: 59 | Admitting: Speech Pathology

## 2021-05-28 ENCOUNTER — Encounter: Payer: 59 | Admitting: Speech Pathology

## 2021-06-20 ENCOUNTER — Other Ambulatory Visit: Payer: Self-pay | Admitting: Cardiovascular Disease

## 2021-06-20 ENCOUNTER — Telehealth: Payer: Self-pay | Admitting: Internal Medicine

## 2021-06-20 DIAGNOSIS — J4520 Mild intermittent asthma, uncomplicated: Secondary | ICD-10-CM

## 2021-06-22 ENCOUNTER — Ambulatory Visit (INDEPENDENT_AMBULATORY_CARE_PROVIDER_SITE_OTHER): Payer: Medicare Other | Admitting: Internal Medicine

## 2021-06-22 ENCOUNTER — Other Ambulatory Visit: Payer: Self-pay

## 2021-06-22 ENCOUNTER — Encounter: Payer: Self-pay | Admitting: Internal Medicine

## 2021-06-22 VITALS — BP 130/70 | HR 118 | Ht 66.0 in | Wt 246.2 lb

## 2021-06-22 DIAGNOSIS — E782 Mixed hyperlipidemia: Secondary | ICD-10-CM

## 2021-06-22 DIAGNOSIS — I471 Supraventricular tachycardia: Secondary | ICD-10-CM | POA: Diagnosis not present

## 2021-06-22 DIAGNOSIS — N289 Disorder of kidney and ureter, unspecified: Secondary | ICD-10-CM | POA: Diagnosis not present

## 2021-06-22 DIAGNOSIS — D034 Melanoma in situ of scalp and neck: Secondary | ICD-10-CM | POA: Insufficient documentation

## 2021-06-22 DIAGNOSIS — D692 Other nonthrombocytopenic purpura: Secondary | ICD-10-CM | POA: Insufficient documentation

## 2021-06-22 DIAGNOSIS — I420 Dilated cardiomyopathy: Secondary | ICD-10-CM

## 2021-06-22 DIAGNOSIS — I10 Essential (primary) hypertension: Secondary | ICD-10-CM

## 2021-06-22 DIAGNOSIS — J4520 Mild intermittent asthma, uncomplicated: Secondary | ICD-10-CM | POA: Diagnosis not present

## 2021-06-22 DIAGNOSIS — Z1231 Encounter for screening mammogram for malignant neoplasm of breast: Secondary | ICD-10-CM

## 2021-06-22 NOTE — Progress Notes (Signed)
Date:  06/22/2021   Name:  Audrey Martinez   DOB:  01-13-1946   MRN:  [id number]   Chief Complaint: Annual Exam (Breast Exam. ) Audrey Martinez is a 76 y.o. female who presents today for her Complete Annual Exam. She feels fairly well. She reports exercising - none. She reports she is sleeping well. Breast complaints - none.  Mammogram: 05/2019 DEXA: 01/2019 Pap smear: discontinued Colonoscopy: 04/2021  Immunization History  Administered Date(s) Administered   Pneumococcal Conjugate-13 07/22/2015   Pneumococcal Polysaccharide-23 07/22/2013   Td 03/03/2002    Hypertension This is a chronic problem. The problem is controlled. Pertinent negatives include no chest pain, headaches, palpitations or shortness of breath. Past treatments include beta blockers and angiotensin blockers. Hypertensive end-organ damage includes kidney disease, CAD/MI and CVA.  Hyperlipidemia This is a chronic problem. The problem is controlled. Pertinent negatives include no chest pain or shortness of breath. Current antihyperlipidemic treatment includes statins and ezetimibe. The current treatment provides significant improvement of lipids.  Asthma There is no cough, shortness of breath or wheezing. This is a recurrent problem. The problem occurs intermittently. Pertinent negatives include no chest pain, fever, headaches or trouble swallowing. Relieved by: stiverdi and flovent. Her past medical history is significant for asthma.   Lab Results  Component Value Date   NA 142 03/31/2021   K 4.1 03/31/2021   CO2 26 03/31/2021   GLUCOSE 116 (H) 03/31/2021   BUN 14 03/31/2021   CREATININE 1.24 (H) 03/31/2021   CALCIUM 9.1 03/31/2021   EGFR 57 (L) 03/11/2021   GFRNONAA 46 (L) 03/31/2021   Lab Results  Component Value Date   CHOL 225 (H) 05/10/2021   HDL 75 05/10/2021   LDLCALC 131 (H) 05/10/2021   TRIG 112 05/10/2021   CHOLHDL 3.0 05/10/2021   Lab Results  Component Value Date   TSH 0.552  01/27/2020   No results found for: HGBA1C Lab Results  Component Value Date   WBC 8.2 03/31/2021   HGB 15.9 (H) 03/31/2021   HCT 45.8 03/31/2021   MCV 92.7 03/31/2021   PLT 250 03/31/2021   Lab Results  Component Value Date   ALT 20 01/22/2021   AST 27 01/22/2021   ALKPHOS 68 01/22/2021   BILITOT 0.8 01/22/2021   Lab Results  Component Value Date   VD25OH 34.76 11/25/2019     Review of Systems  Constitutional:  Negative for chills, fatigue and fever.  HENT:  Negative for congestion, hearing loss, tinnitus, trouble swallowing and voice change.   Eyes:  Negative for visual disturbance.  Respiratory:  Negative for cough, chest tightness, shortness of breath and wheezing.   Cardiovascular:  Negative for chest pain, palpitations and leg swelling.  Gastrointestinal:  Negative for abdominal pain, constipation, diarrhea and vomiting.  Endocrine: Negative for polydipsia and polyuria.  Genitourinary:  Negative for dysuria, frequency, genital sores, vaginal bleeding and vaginal discharge.  Musculoskeletal:  Negative for arthralgias, gait problem and joint swelling.  Skin:  Negative for color change and rash.  Neurological:  Negative for dizziness, tremors, light-headedness and headaches.  Hematological:  Negative for adenopathy. Does not bruise/bleed easily.  Psychiatric/Behavioral:  Negative for dysphoric mood and sleep disturbance. The patient is not nervous/anxious.    Patient Active Problem List   Diagnosis Date Noted   Trochanteric bursitis of right hip 05/18/2021   Hx of colonic polyps    Atrial flutter ([place]) 01/31/2020   Obesity (BMI 30-39.9) 01/31/2020   Left lower lobe pulmonary  nodule 01/29/2020   Chronic obstructive pulmonary disease, unspecified COPD type ([place]) 10/08/2019   Dilated cardiomyopathy ([place]) 05/02/2019   Amaurosis fugax of right eye 01/13/2019   Renal insufficiency 11/08/2017   Lumbar radiculopathy 05/18/2017   Lumbar degenerative disc disease 05/18/2017    Spinal stenosis of lumbar region without neurogenic claudication 05/18/2017   Atrial tachycardia ([place]) 04/14/2017   SVT (supraventricular tachycardia) ([place]) 04/14/2017   Essential hypertension 04/14/2017   Intermittent asthma without complication 41/32/4401   Osteoporosis 03/21/2017   Benign essential tremor 03/21/2017   Hx of atrioventricular node ablation 02/01/2008   Hyperlipidemia 05/12/2003   Personal history of transient ischemic attack (TIA), and cerebral infarction without residual deficits 04/22/2003    Allergies  Allergen Reactions   Amoxicillin Hives, Shortness Of Breath and Swelling    Did it involve swelling of the face/tongue/throat, SOB, or low BP? Yes Did it involve sudden or severe rash/hives, skin peeling, or any reaction on the inside of your mouth or nose? Yes Did you need to seek medical attention at a hospital or doctor's office? Yes When did it last happen? 1998      If all above answers are NO, may proceed with cephalosporin use.    Aspirin Anaphylaxis   Ciprofloxacin Rash   Ciprofloxacin-Dexamethasone Anaphylaxis   Crestor [Rosuvastatin] Other (See Comments)    Caused problems with memory.   Morphine And Related Nausea And Vomiting   Doxycycline Hyclate Hives   Hydrochlorothiazide Other (See Comments)    Raises her blood pressure.    Keflex [Cephalexin] Hives   Lovastatin Other (See Comments)    "Unable to function"   Simvastatin Other (See Comments)    "Unable to function"   Erythromycin Hives   Ibuprofen Other (See Comments)    Sharp stomach pains   Neomycin-Bacitracin Zn-Polymyx Hives and Rash   Omeprazole Palpitations    Tachycardia and same sensation as her SVTs per patient   Tape Hives and Rash    Past Surgical History:  Procedure Laterality Date   BACK SURGERY  2017   L2-4 laminectomy and foraminal stenosis   BREAST CYST ASPIRATION Left [place]  2009   [place] in [place]  2009   CATARACT EXTRACTION, BILATERAL  2010   COLONOSCOPY WITH PROPOFOL N/A 03/21/2018   Procedure: COLONOSCOPY WITH Biopsy;  Surgeon: Lin Landsman, MD;  Location: [facility];  Service: Endoscopy;  Laterality: N/A;   COLONOSCOPY WITH PROPOFOL N/A 05/03/2021   Procedure: COLONOSCOPY WITH PROPOFOL;  Surgeon: Lin Landsman, MD;  Location[facility] ENDOSCOPY;  Service: Gastroenterology;  Laterality: N/A;   CORONARY ANGIOPLASTY     derrick procedure  1977-78   bilateral    ESOPHAGOGASTRODUODENOSCOPY  2013   gastritis; done for complaint of dysphagia   ESOPHAGOGASTRODUODENOSCOPY (EGD) WITH PROPOFOL N/A 03/21/2018   Procedure: ESOPHAGOGASTRODUODENOSCOPY (EGD) WITH Biopsies;  Surgeon: Lin Landsman, MD;  Location: [facility];  Service: Endoscopy;  Laterality: N/A;  KEEP THIS PATIENT FIRST   JOINT REPLACEMENT     LEFT HEART CATH AND CORONARY ANGIOGRAPHY Left 05/02/2019   Procedure: LEFT HEART CATH AND CORONARY ANGIOGRAPHY;  Surgeon: Minna Merritts, MD;  Location: [place] CV LAB;  Service: Cardiovascular;  Laterality: Left;   lymph node removal     neck   MOHS SURGERY  07/2016   BCCA nose   POLYPECTOMY N/A 03/21/2018   Procedure: POLYPECTOMY;  Surgeon: Lin Landsman, MD;  Location:  [facility];  Service: Endoscopy;  Laterality: N/A;   REPLACEMENT TOTAL KNEE  2013   RT   TOTAL SHOULDER REPLACEMENT  2012   RT     Social History   Tobacco Use   Smoking status: Never   Smokeless tobacco: Never  Vaping Use   Vaping Use: Never used  Substance Use Topics   Alcohol use: Not Currently    Comment: may drink 1-2x/yr   Drug use: No     Medication list has been reviewed and updated.  Current Meds  Medication Sig   albuterol (PROVENTIL HFA;VENTOLIN HFA) 108 (90 Base) MCG/ACT inhaler Inhale 1-2 puffs into the lungs every 6 (six) hours as needed for wheezing or shortness of breath.    B Complex-C  (B-COMPLEX WITH VITAMIN C) tablet Take 1 tablet by mouth daily.    baclofen (LIORESAL) 10 MG tablet TAKE 1 TABLET BY MOUTH THREE TIMES A DAY   Bioflavonoid Products (ESTER-C) TABS Take 1 tablet by mouth 2 (two) times daily.    calcium carbonate (OS-CAL - DOSED IN MG OF ELEMENTAL CALCIUM) 1250 (500 Ca) MG tablet Take 1 tablet by mouth.   carvedilol (COREG) 12.5 MG tablet Take 1 tablet (12.5 mg total) by mouth 2 (two) times daily with a meal.   cholecalciferol (VITAMIN D) 25 MCG (1000 UT) tablet Take 1,000 Units by mouth daily.   Cranberry 1000 MG CAPS Take 1,000 mg by mouth daily.    ezetimibe (ZETIA) 10 MG tablet Take 10 mg by mouth daily.   FLOVENT HFA 110 MCG/ACT inhaler INHALE 2 PUFFS INTO THE LUNGS TWICE A DAY   gabapentin (NEURONTIN) 300 MG capsule TAKE 1 CAPSULE BY MOUTH 3 TIMES DAILY. AND TAKE 3 CAPSULES DAILY AT BEDTIME.   Ginger, Zingiber officinalis, (GINGER PO) Take 1,100 mg by mouth daily.    ipratropium (ATROVENT) 0.03 % nasal spray SMARTSIG:1-2 Spray(s) Both Nares 3 Times Daily   losartan (COZAAR) 25 MG tablet losartan 25 mg tablet  TAKE 1/2 TABLET BY MOUTH EVERY DAY   MAG ASPART-POTASSIUM ASPART PO Take 1 tablet by mouth 2 (two) times daily. 600 mg-198 mg   meloxicam (MOBIC) 15 MG tablet Take 15 mg by mouth daily.   Misc Natural Products (TART CHERRY ADVANCED PO) Take 1 capsule by mouth daily.    montelukast (SINGULAIR) 10 MG tablet Take 10 mg by mouth every evening.   Multiple Vitamins-Minerals (MULTIVITAMIN WITH MINERALS) tablet Take 1 tablet by mouth daily. Centrum Silver   Olodaterol HCl (STRIVERDI RESPIMAT) 2.5 MCG/ACT AERS Inhale 2 puffs into the lungs daily. USE AT THE SAME TIME EVERYDAY   Probiotic Product (PROBIOTIC DAILY PO) Take 1 capsule by mouth 2 (two) times a week.    rivaroxaban (XARELTO) 20 MG TABS tablet Take 1 tablet (20 mg total) by mouth as needed (PRN for long car or plane ride).   sacubitril-valsartan (ENTRESTO) 24-26 MG Take 1 tablet by mouth 2 (two) times  daily.   Turmeric 500 MG CAPS Take 500 mg by mouth daily.    zinc sulfate 220 (50 Zn) MG capsule Take 1 capsule (220 mg total) by mouth daily.    PHQ 2/9 Scores 06/22/2021 04/07/2021 05/06/2020 04/06/2020  PHQ - 2 Score 0 0 0 0  PHQ- 9 Score 0 - 0 -    GAD 7 : Generalized Anxiety Score 06/22/2021 05/06/2020 11/25/2019  Nervous, Anxious, on Edge 0 0 0  Control/stop worrying 0 0 0  Worry too much - different things 0 0 0  Trouble relaxing 0 0 0  Restless 0 0 0  Easily annoyed or irritable 0 0 0  Afraid - awful might happen 0 0 0  Total GAD 7 Score 0 0 0  Anxiety Difficulty Not difficult at all - Not difficult at all    BP Readings from Last 3 Encounters:  06/22/21 130/70  05/10/21 130/90  05/03/21 (!) 151/87    Physical Exam Vitals and nursing note reviewed.  Constitutional:      General: She is not in acute distress.    Appearance: She is well-developed.  HENT:     Head: Normocephalic and atraumatic.     Right Ear: Tympanic membrane and ear canal normal.     Left Ear: Tympanic membrane and ear canal normal.     Nose:     Right Sinus: No maxillary sinus tenderness.     Left Sinus: No maxillary sinus tenderness.  Eyes:     General: No scleral icterus.       Right eye: No discharge.        Left eye: No discharge.     Conjunctiva/sclera: Conjunctivae normal.  Neck:     Thyroid: No thyromegaly.     Vascular: No carotid bruit.  Cardiovascular:     Rate and Rhythm: Normal rate and regular rhythm.     Pulses: Normal pulses.     Heart sounds: Normal heart sounds.  Pulmonary:     Effort: Pulmonary effort is normal. No respiratory distress.     Breath sounds: No wheezing.  Chest:  Breasts:    Right: No mass, nipple discharge, skin change or tenderness.     Left: No mass, nipple discharge, skin change or tenderness.  Abdominal:     General: Bowel sounds are normal.     Palpations: Abdomen is soft.     Tenderness: There is no abdominal tenderness.  Musculoskeletal:      Cervical back: Normal range of motion. No erythema.     Right lower leg: No edema.     Left lower leg: No edema.  Lymphadenopathy:     Cervical: No cervical adenopathy.  Skin:    General: Skin is warm and dry.     Findings: No rash.  Neurological:     Mental Status: She is alert and oriented to person, place, and time.     Cranial Nerves: No cranial nerve deficit.     Sensory: No sensory deficit.     Deep Tendon Reflexes: Reflexes are normal and symmetric.  Psychiatric:        Attention and Perception: Attention normal.        Mood and Affect: Mood normal.    Wt Readings from Last 3 Encounters:  06/22/21 246 lb 3.2 oz (111.7 kg)  05/10/21 245 lb (111.1 kg)  05/03/21 244 lb (110.7 kg)    BP 130/70    Pulse (!) 118    Ht _0  (1.676 m)    Wt 246 lb 3.2 oz (111.7 kg)    SpO2 96%    BMI 39.74 kg/m   Assessment and Plan: 1. Essential hypertension Clinically stable exam with well controlled BP. Tolerating medications without side effects at this time. Pt to continue current regimen and low sodium diet; benefits of regular exercise as able discussed. - Comprehensive metabolic panel  2. Encounter for screening mammogram for breast cancer Schedule at [place]  3. SVT (supraventricular tachycardia) ([place]) Followed by Cardiology. Rate fairly well controlled by  Coreg  4. Mild intermittent asthma without complication Stable asthma with cough and phlegm. Continue Pulmonary follow up, Flovent and Striverdi  5. Mixed hyperlipidemia She believes that she is taking Crestor but it might be Zetia. Will check labs and she will check meds at home. - Lipid panel  6. Renal insufficiency Continue to monitor - Comprehensive metabolic panel  7. Melanoma in situ of neck ([place]) Followed by Dermatology.  8. Dilated cardiomyopathy ([place]) On Losartan 25 mg and Entresto.  She declines all vaccines.  Partially dictated using Editor, commissioning. Any errors are  unintentional.  Halina Maidens, MD [facility]  06/22/2021

## 2021-06-23 ENCOUNTER — Other Ambulatory Visit: Payer: Self-pay | Admitting: Cardiovascular Disease

## 2021-06-23 ENCOUNTER — Other Ambulatory Visit: Payer: Self-pay | Admitting: Internal Medicine

## 2021-06-23 DIAGNOSIS — M79601 Pain in right arm: Secondary | ICD-10-CM

## 2021-06-23 LAB — COMPREHENSIVE METABOLIC PANEL
ALT: 17 IU/L (ref 0–32)
AST: 25 IU/L (ref 0–40)
Albumin/Globulin Ratio: 2.3 — ABNORMAL HIGH (ref 1.2–2.2)
Albumin: 4.1 g/dL (ref 3.7–4.7)
Alkaline Phosphatase: 71 IU/L (ref 44–121)
BUN/Creatinine Ratio: 17 (ref 12–28)
BUN: 17 mg/dL (ref 8–27)
Bilirubin Total: 0.5 mg/dL (ref 0.0–1.2)
CO2: 22 mmol/L (ref 20–29)
Calcium: 9.4 mg/dL (ref 8.7–10.3)
Chloride: 107 mmol/L — ABNORMAL HIGH (ref 96–106)
Creatinine, Ser: 0.98 mg/dL (ref 0.57–1.00)
Globulin, Total: 1.8 g/dL (ref 1.5–4.5)
Glucose: 97 mg/dL (ref 70–99)
Potassium: 4.4 mmol/L (ref 3.5–5.2)
Sodium: 145 mmol/L — ABNORMAL HIGH (ref 134–144)
Total Protein: 5.9 g/dL — ABNORMAL LOW (ref 6.0–8.5)
eGFR: 61 mL/min/{1.73_m2} (ref >59)

## 2021-06-23 LAB — LIPID PANEL
Chol/HDL Ratio: 3.4 ratio (ref 0.0–4.4)
Cholesterol, Total: 226 mg/dL — ABNORMAL HIGH (ref 100–199)
HDL: 66 mg/dL (ref >39)
LDL Chol Calc (NIH): 136 mg/dL — ABNORMAL HIGH (ref 0–99)
Triglycerides: 136 mg/dL (ref 0–149)
VLDL Cholesterol Cal: 24 mg/dL (ref 5–40)

## 2021-06-23 MED ORDER — FLUTICASONE PROPIONATE HFA 110 MCG/ACT IN AERO: INHALATION_SPRAY | RESPIRATORY_TRACT | 2 refills | Status: DC

## 2021-06-23 NOTE — Telephone Encounter (Signed)
Requested Prescriptions  Pending Prescriptions Disp Refills   gabapentin (NEURONTIN) 300 MG capsule [Pharmacy Med Name: GABAPENTIN 300 MG CAPSULE] 540 capsule 1    Sig: TAKE 1 CAPSULE BY MOUTH 3 TIMES DAILY. AND TAKE 3 CAPSULES DAILY AT BEDTIME.     Neurology: Anticonvulsants - gabapentin Passed - 06/23/2021 12:19 PM      Passed - Valid encounter within last 12 months    Recent Outpatient Visits          Yesterday Essential hypertension   [facility] Glean Hess, MD   1 year ago Essential hypertension   [facility] Glean Hess, MD   1 year ago Diarrhea, unspecified type   [facility] Glean Hess, MD   1 year ago Essential hypertension   [facility] Glean Hess, MD   1 year ago Pain of right thigh   [facility] Glean Hess, MD      Future Appointments            In 1 month Brendolyn Patty, MD [place]   In 4 months Flora Lipps, MD [facility]   In 4 months [place], Kathlene November, MD [facility], [facility]   In 1 year Army Melia, Jesse Sans, MD [facility], [facility]

## 2021-06-23 NOTE — Telephone Encounter (Signed)
90 day supply of Flovent has been sent to preferred pharmacy.  Patient is aware and voiced her understanding.  Nothing further needed at this time.

## 2021-06-24 ENCOUNTER — Other Ambulatory Visit: Payer: Self-pay | Admitting: Internal Medicine

## 2021-06-24 NOTE — Telephone Encounter (Signed)
Copied from [place] [id number]. Topic: Quick Communication - Rx Refill/Question >> Jun 24, 2021 10:33 AM Leward Quan A wrote: Medication: meloxicam (MOBIC) 15 MG tablet Req 90 day supply   Has the patient contacted their pharmacy? Yes.   (Agent: If no, request that the patient contact the pharmacy for the refill. If patient does not wish to contact the pharmacy document the reason why and proceed with request.) (Agent: If yes, when and what did the pharmacy advise?)  Preferred Pharmacy (with phone number or street name): [id number] - [place], [place]  Phone:  [phone] Fax:  [phone]  Has the patient been seen for an appointment in the last year OR does the patient have an upcoming appointment? Yes.    Agent: Please be advised that RX refills may take up to 3 business days. We ask that you follow-up with your pharmacy.

## 2021-06-24 NOTE — Telephone Encounter (Signed)
Requested medication (s) are due for refill today: historical provider   Requested medication (s) are on the active medication list: yes   Last refill:  05/19/21  Future visit scheduled: yes in 1 year   Notes to clinic:  historical provider do you want to renew ?     Requested Prescriptions  Pending Prescriptions Disp Refills   meloxicam (MOBIC) 15 MG tablet      Sig: Take 1 tablet (15 mg total) by mouth daily.     Analgesics:  COX2 Inhibitors Failed - 06/24/2021  1:40 PM      Failed - HGB in normal range and within 360 days    Hemoglobin  Date Value Ref Range Status  03/31/2021 15.9 (H) 12.0 - 15.0 g/dL Final  04/18/2019 15.7 11.1 - 15.9 g/dL Final          Passed - Cr in normal range and within 360 days    Creatinine, Ser  Date Value Ref Range Status  06/22/2021 0.98 0.57 - 1.00 mg/dL Final          Passed - Patient is not pregnant      Passed - Valid encounter within last 12 months    Recent Outpatient Visits           2 days ago Essential hypertension   [facility] Glean Hess, MD   1 year ago Essential hypertension   [facility] Glean Hess, MD   1 year ago Diarrhea, unspecified type   [facility] Glean Hess, [facility]   1 year ago Essential hypertension   [facility] Glean Hess, MD   1 year ago Pain of right thigh   [facility] Glean Hess, MD       Future Appointments             In 1 month Brendolyn Patty, MD [place]   In 4 months Flora Lipps, MD [facility] [place]   In 4 months Gollan, Kathlene November, MD [facility]   In 1 year Army Melia, Jesse Sans, MD [facility], [place]

## 2021-06-24 NOTE — Telephone Encounter (Signed)
Refill if appropriate.  Please advise.  

## 2021-06-25 MED ORDER — MELOXICAM 15 MG PO TABS: 15.0000 mg | ORAL_TABLET | Freq: Every day | ORAL | 0 refills | Status: DC | PRN

## 2021-07-15 ENCOUNTER — Telehealth: Payer: Self-pay

## 2021-07-15 NOTE — Telephone Encounter (Signed)
Called pt as a reminder to call and schedule mammogram. Pt verbalized understanding.  KP 

## 2021-08-16 ENCOUNTER — Ambulatory Visit (INDEPENDENT_AMBULATORY_CARE_PROVIDER_SITE_OTHER): Payer: Medicare Other | Admitting: Dermatology

## 2021-08-16 ENCOUNTER — Other Ambulatory Visit: Payer: Self-pay

## 2021-08-16 DIAGNOSIS — L821 Other seborrheic keratosis: Secondary | ICD-10-CM

## 2021-08-16 DIAGNOSIS — L814 Other melanin hyperpigmentation: Secondary | ICD-10-CM | POA: Diagnosis not present

## 2021-08-16 DIAGNOSIS — Z86006 Personal history of melanoma in-situ: Secondary | ICD-10-CM

## 2021-08-16 DIAGNOSIS — L578 Other skin changes due to chronic exposure to nonionizing radiation: Secondary | ICD-10-CM

## 2021-08-16 DIAGNOSIS — D229 Melanocytic nevi, unspecified: Secondary | ICD-10-CM | POA: Diagnosis not present

## 2021-08-16 DIAGNOSIS — Z85828 Personal history of other malignant neoplasm of skin: Secondary | ICD-10-CM | POA: Diagnosis not present

## 2021-08-16 DIAGNOSIS — Z1283 Encounter for screening for malignant neoplasm of skin: Secondary | ICD-10-CM | POA: Diagnosis not present

## 2021-08-16 DIAGNOSIS — L853 Xerosis cutis: Secondary | ICD-10-CM | POA: Diagnosis not present

## 2021-08-16 DIAGNOSIS — L820 Inflamed seborrheic keratosis: Secondary | ICD-10-CM | POA: Diagnosis not present

## 2021-08-16 DIAGNOSIS — D1800 Hemangioma unspecified site: Secondary | ICD-10-CM | POA: Diagnosis not present

## 2021-08-16 NOTE — Patient Instructions (Addendum)
Gentle Skin Care Guide ? ?1. Bathe no more than once a day. ? ?2. Avoid bathing in hot water ? ?3. Use a mild soap like Dove, Vanicream, Cetaphil, CeraVe. Can use Lever 2000 or Cetaphil antibacterial soap ? ?4. Use soap only where you need it. On most days, use it under your arms, between your legs, and on your feet. Let the water rinse other areas unless visibly dirty. ? ?5. When you get out of the bath/shower, use a towel to gently blot your skin dry, don't rub it. ? ?6. While your skin is still a little damp, apply a moisturizing cream such as Vanicream, CeraVe, Cetaphil, Eucerin, Sarna lotion or plain Vaseline Jelly. For hands apply Neutrogena [place] Hand Cream or Excipial Hand Cream. ? ?7. Reapply moisturizer any time you start to itch or feel dry. ? ?8. Sometimes using free and clear laundry detergents can be helpful. Fabric softener sheets should be avoided. Downy Free & Gentle liquid, or any liquid fabric softener that is free of dyes and perfumes, it acceptable to use ? ?9. If your doctor has given you prescription creams you may apply moisturizers over them  ? ? ? ? ? ?If You Need Anything After Your Visit ? ?If you have any questions or concerns for your doctor, please call our main line at [phone] and press option 4 to reach your doctor's medical assistant. If no one answers, please leave a voicemail as directed and we will return your call as soon as possible. Messages left after 4 pm will be answered the following business day.  ? ?You may also send [place] a message via MyChart. We typically respond to MyChart messages within 1-2 business days. ? ?For prescription refills, please ask your pharmacy to contact our office. Our fax number is [phone]. ? ?If you have an urgent issue when the clinic is closed that cannot wait until the next business day, you can page your doctor at the number below.   ? ?Please note that while we do our best to be available for urgent issues outside of office hours,  we are not available 24/7.  ? ?If you have an urgent issue and are unable to reach [place], you may choose to seek medical care at your doctor's office, retail clinic, urgent care center, or emergency room. ? ?If you have a medical emergency, please immediately call 911 or go to the emergency department. ? ?Pager Numbers ? ?- Dr. Nehemiah Massed: [phone] ? ?- Dr. Laurence Ferrari: [phone] ? ?- Dr. Nicole Kindred: [phone] ? ?In the event of inclement weather, please call our main line at [phone] for an update on the status of any delays or closures. ? ?Dermatology Medication Tips: ?Please keep the boxes that topical medications come in in order to help keep track of the instructions about where and how to use these. Pharmacies typically print the medication instructions only on the boxes and not directly on the medication tubes.  ? ?If your medication is too expensive, please contact our office at [phone] option 4 or send [place] a message through Ernest.  ? ?We are unable to tell what your co-pay for medications will be in advance as this is different depending on your insurance coverage. However, we may be able to find a substitute medication at lower cost or fill out paperwork to get insurance to cover a needed medication.  ? ?If a prior authorization is required to get your medication covered by your insurance company, please allow [place] 1-2 business days  to complete this process. ? ?Drug prices often vary depending on where the prescription is filled and some pharmacies may offer cheaper prices. ? ?The website www.goodrx.com contains coupons for medications through different pharmacies. The prices here do not account for what the cost may be with help from insurance (it may be cheaper with your insurance), but the website can give you the price if you did not use any insurance.  ?- You can print the associated coupon and take it with your prescription to the pharmacy.  ?- You may also stop by our office during regular  business hours and pick up a GoodRx coupon card.  ?- If you need your prescription sent electronically to a different pharmacy, notify our office through [facility] or by phone at [phone] option 4. ? ? ? ? ?Si Usted Necesita Algo Despu?s de Su Visita ? ?Tambi?n puede enviarnos un mensaje a trav?s de MyChart. Por lo general respondemos a los mensajes de MyChart en el transcurso de 1 a 2 d?as h?biles. ? ?Para renovar recetas, por favor pida a su farmacia que se ponga en contacto con nuestra oficina. Nuestro n?mero de fax es el [phone]. ? ?Si tiene un asunto urgente cuando la cl?nica est? cerrada y que no puede esperar hasta el siguiente d?a h?bil, puede llamar/localizar a su doctor(a) al n?mero que aparece a continuaci?n.  ? ?Por favor, tenga en cuenta que aunque hacemos todo lo posible para estar disponibles para asuntos urgentes fuera del horario de oficina, no estamos disponibles las 24 horas del d?a, los 7 d?as de la semana.  ? ?Si tiene un problema urgente y no puede comunicarse con nosotros, puede optar por buscar atenci?n m?dica  en el consultorio de su doctor(a), en una cl?nica privada, en un centro de atenci?n urgente o en una sala de emergencias. ? ?Si tiene Engineer, maintenance (IT) m?dica, por favor llame inmediatamente al 911 o vaya a la sala de emergencias. ? ?N?meros de b?per ? ?- Dr. Nehemiah Massed: [phone] ? ?- Dra. Moye: [phone] ? ?- Dra. Nicole Kindred: [phone] ? ?En caso de inclemencias del tiempo, por favor llame a nuestra l?nea principal al [phone] para una actualizaci?n sobre el estado de cualquier retraso o cierre. ? ?Consejos para la medicaci?n en dermatolog?a: ?Por favor, guarde las cajas en las que vienen los medicamentos de uso t?pico para ayudarle a seguir las instrucciones sobre d?nde y c?mo usarlos. Las farmacias generalmente imprimen las instrucciones del medicamento s?lo en las cajas y no directamente en los tubos del [employer].  ? ?Si su medicamento es muy caro, por  favor, p?ngase en contacto con Zigmund Daniel llamando al [phone] y presione la opci?n 4 o env?enos un mensaje a trav?s de MyChart.  ? ?No podemos decirle cu?l ser? su copago por los medicamentos por adelantado ya que esto es diferente dependiendo de la cobertura de su seguro. Sin embargo, es posible que podamos encontrar un medicamento sustituto a Electrical engineer un formulario para que el seguro cubra el medicamento que se considera necesario.  ? ?Si se requiere Ardelia Mems autorizaci?n previa para que su compa??a de seguros [place] su medicamento, por favor perm?tanos de 1 a 2 d?as h?biles para completar este proceso. ? ?Los precios de los medicamentos var?an con frecuencia dependiendo del Environmental consultant de d?nde se surte la receta y alguna farmacias pueden ofrecer precios m?s baratos. ? ?El sitio web www.goodrx.com tiene cupones para medicamentos de Airline pilot. Los precios aqu? no tienen en cuenta lo que podr?a costar con la [employer]  del seguro (puede ser m?s barato con su seguro), pero el sitio web puede darle el precio si no utiliz? ning?n seguro.  ?- Puede imprimir el cup?n correspondiente y llevarlo con su receta a la farmacia.  ?- Tambi?n puede pasar por nuestra oficina durante el horario de atenci?n regular y recoger una tarjeta de cupones de GoodRx.  ?- Si necesita que su receta se env?e electr?nicamente a Chiropodist, informe a nuestra oficina a trav?s de MyChart de  o por tel?fono llamando al [phone] y presione la opci?n 4. ? ?

## 2021-08-16 NOTE — Progress Notes (Signed)
? ?Follow-Up Visit ?  ?Subjective  ?Audrey Martinez is a 76 y.o. female who presents for the following: Annual Exam (Hx MM and BCC ). The patient presents for Total-Body Skin Exam (TBSE) for skin cancer screening and mole check.  The patient has spots, moles and lesions to be evaluated, some may be new or changing.  She has several spots that are itchy and irritated on face, neck and chest. ? ? ?The following portions of the chart were reviewed this encounter and updated as appropriate:  ?  ?  ? ?Review of Systems:  No other skin or systemic complaints except as noted in HPI or Assessment and Plan. ? ?Objective  ?Well appearing patient in no apparent distress; mood and affect are within normal limits. ? ?A full examination was performed including scalp, head, eyes, ears, nose, lips, neck, chest, axillae, abdomen, back, buttocks, bilateral upper extremities, bilateral lower extremities, hands, feet, fingers, toes, fingernails, and toenails. All findings within normal limits unless otherwise noted below. ? ?R temple x 1, R forehead x 1, L chest x 1, R neck x 1 (4) ?Erythematous stuck-on, waxy papule ? ?L infra ocular ?0.4 cm grey brown speckled macule.  ? ? ? ? ? ? ? ? ?Assessment & Plan  ?Inflamed seborrheic keratosis (4) ?R temple x 1, R forehead x 1, L chest x 1, R neck x 1 ? ?Destruction of lesion - R temple x 1, R forehead x 1, L chest x 1, R neck x 1 ? ?Destruction method: cryotherapy   ?Informed consent: discussed and consent obtained   ?Lesion destroyed using liquid nitrogen: Yes   ?Region frozen until ice ball extended beyond lesion: Yes   ?Outcome: patient tolerated procedure well with no complications   ?Post-procedure details: wound care instructions given   ?Additional details:  Prior to procedure, discussed risks of blister formation, small wound, skin dyspigmentation, or rare scar following cryotherapy. Recommend Vaseline ointment to treated areas while healing.  ? ?Lentigines ?L infra  ocular ? ?Benign-appearing.  Observation.  Call clinic for new or changing lesions.  Recommend daily use of broad spectrum spf 30+ sunscreen to sun-exposed areas.  ? ?Recheck on f/up ? ? ? ?Lentigines ?- Scattered tan macules ?- Due to sun exposure ?- Benign-appearing, observe ?- Recommend daily broad spectrum sunscreen SPF 30+ to sun-exposed areas, reapply every 2 hours as needed. ?- Call for any changes ? ?Seborrheic Keratoses ?- Stuck-on, waxy, tan-brown papules and/or plaques  ?- Benign-appearing ?- Discussed benign etiology and prognosis. ?- Observe ?- Call for any changes ? ?Melanocytic Nevi ?- Tan-brown and/or pink-flesh-colored symmetric macules and papules ?- Benign appearing on exam today ?- Observation ?- Call clinic for new or changing moles ?- Recommend daily use of broad spectrum spf 30+ sunscreen to sun-exposed areas.  ? ?Hemangiomas ?- Red papules ?- Discussed benign nature ?- Observe ?- Call for any changes ? ?Actinic Damage ?- Chronic condition, secondary to cumulative UV/sun exposure ?- diffuse scaly erythematous macules with underlying dyspigmentation ?- Recommend daily broad spectrum sunscreen SPF 30+ to sun-exposed areas, reapply every 2 hours as needed.  ?- Staying in the shade or wearing long sleeves, sun glasses (UVA+UVB protection) and wide brim hats (4-inch brim around the entire circumference of the hat) are also recommended for sun protection.  ?- Call for new or changing lesions. ? ?History of Melanoma In Situ - R post neck tx 08/10/2020 ?- No evidence of recurrence today ?- Recommend regular full body skin exams ?- Recommend  daily broad spectrum sunscreen SPF 30+ to sun-exposed areas, reapply every 2 hours as needed.  ?- Call if any new or changing lesions are noted between office visits ? ?History of Basal Cell Carcinoma of the Skin ?- No evidence of recurrence today forehead ?- Recommend regular full body skin exams ?- Recommend daily broad spectrum sunscreen SPF 30+ to sun-exposed  areas, reapply every 2 hours as needed.  ?- Call if any new or changing lesions are noted between office visits ? ?Xerosis ?- Diffuse xerotic patches ?- Recommend gentle, hydrating skin care ?- Gentle skin care handout given ?- Samples give of Eucerin Advanced Repair Cream.  ?Skin cancer screening performed today. ? ?Return in about 6 months (around 02/16/2022) for TBSE, h/o melanoma IS. ? ?IRudell Cobb, CMA, am acting as scribe for Brendolyn Patty, MD . ? ?Documentation: I have reviewed the above documentation for accuracy and completeness, and I agree with the above. ? ?Brendolyn Patty MD  ? ?

## 2021-08-18 ENCOUNTER — Ambulatory Visit
Admission: RE | Admit: 2021-08-18 | Discharge: 2021-08-18 | Disposition: A | Discharge status: Home / Self Care | Payer: Medicare Other | Source: Ambulatory Visit | Attending: Internal Medicine | Admitting: Internal Medicine

## 2021-08-18 ENCOUNTER — Other Ambulatory Visit: Payer: Self-pay

## 2021-08-18 DIAGNOSIS — Z1231 Encounter for screening mammogram for malignant neoplasm of breast: Secondary | ICD-10-CM | POA: Diagnosis not present

## 2021-08-24 ENCOUNTER — Other Ambulatory Visit: Payer: Self-pay | Admitting: Internal Medicine

## 2021-08-24 DIAGNOSIS — E782 Mixed hyperlipidemia: Secondary | ICD-10-CM

## 2021-08-25 NOTE — Telephone Encounter (Signed)
Requested medication (s) are due for refill today:   Not sure ? ?Requested medication (s) are on the active medication list:   Yes as a historical drug ? ?Future visit scheduled:   Yes in 10 mo. ? ? ?Last ordered: 05/19/2021 ? ?Returned because last prescribed by a historical provider.  ? ?Requested Prescriptions  ?Pending Prescriptions Disp Refills  ? ezetimibe (ZETIA) 10 MG tablet [Pharmacy Med Name: EZETIMIBE 10 MG TABLET] 90 tablet 2  ?  Sig: TAKE 1 TABLET BY MOUTH EVERY DAY  ?  ? Cardiovascular:  Antilipid - Sterol Transport Inhibitors Failed - 08/24/2021 10:55 AM  ?  ?  Failed - Lipid Panel in normal range within the last 12 months  ?  Cholesterol, Total  ?Date Value Ref Range Status  ?06/22/2021 226 (H) 100 - 199 mg/dL Final  ? ?LDL Chol Calc (NIH)  ?Date Value Ref Range Status  ?06/22/2021 136 (H) 0 - 99 mg/dL Final  ? ?HDL  ?Date Value Ref Range Status  ?06/22/2021 66 >39 mg/dL Final  ? ?Triglycerides  ?Date Value Ref Range Status  ?06/22/2021 136 0 - 149 mg/dL Final  ? ?  ?  ?  Passed - AST in normal range and within 360 days  ?  AST  ?Date Value Ref Range Status  ?06/22/2021 25 0 - 40 IU/L Final  ?  ?  ?  ?  Passed - ALT in normal range and within 360 days  ?  ALT  ?Date Value Ref Range Status  ?06/22/2021 17 0 - 32 IU/L Final  ?  ?  ?  ?  Passed - Patient is not pregnant  ?  ?  Passed - Valid encounter within last 12 months  ?  Recent Outpatient Visits   ? ?      ? 2 months ago Essential hypertension  ? [facility], [facility]  ? 1 year ago Essential hypertension  ? [facility] Glean Hess, MD  ? 1 year ago Diarrhea, unspecified type  ? [facility]an Hess, [facility]  ? 1 year ago Essential hypertension  ? [facility] Glean Hess, MD  ? 1 year ago Pain of right thigh  ? [facility] Glean Hess, MD  ? ?  ?  ?Future Appointments   ? ?        ? In 2 months Flora Lipps, MD [place]  ? In 2 months Gollan,  Kathlene November, MD [facility]  ? In 10 months Glean Hess, MD [facility], [place]  ? ?  ? ?  ?  ?  ? ?

## 2021-08-29 ENCOUNTER — Other Ambulatory Visit: Payer: Self-pay | Admitting: Internal Medicine

## 2021-09-01 ENCOUNTER — Other Ambulatory Visit: Payer: Self-pay | Admitting: Internal Medicine

## 2021-09-06 ENCOUNTER — Telehealth: Payer: Self-pay | Admitting: Internal Medicine

## 2021-09-06 NOTE — Telephone Encounter (Signed)
Called and spoke with patient, she has 2 flovent inhalers that will get her until her appointment on 5/30.  She ususally gets a 3 months supply that is $40 and the one inhaler cost her $52.  I advised her that is because she has  not been seen in a year and we can only provide enough to get her to her appointment and after that we can do the 3 month supply with refills.  She verbalize understanding.  Nothing further needed. ?

## 2021-09-15 ENCOUNTER — Other Ambulatory Visit: Payer: Self-pay | Admitting: Internal Medicine

## 2021-09-15 NOTE — Telephone Encounter (Signed)
Requested Prescriptions  ?Pending Prescriptions Disp Refills  ?? baclofen (LIORESAL) 10 MG tablet [Pharmacy Med Name: BACLOFEN 10 MG TABLET] 270 tablet 0  ?  Sig: TAKE 1 TABLET BY MOUTH THREE TIMES A DAY  ?  ? Analgesics:  Muscle Relaxants - baclofen Passed - 09/15/2021  2:25 AM  ?  ?  Passed - Cr in normal range and within 180 days  ?  Creatinine, Ser  ?Date Value Ref Range Status  ?06/22/2021 0.98 0.57 - 1.00 mg/dL Final  ?   ?  ?  Passed - eGFR is 30 or above and within 180 days  ?  GFR calc Af Amer  ?Date Value Ref Range Status  ?02/01/2020 >60 >60 mL/min Final  ? ?GFR, Estimated  ?Date Value Ref Range Status  ?03/31/2021 46 (L) >60 mL/min Final  ?  Comment:  ?  (NOTE) ?Calculated using the CKD-EPI Creatinine Equation (2021) ?  ? ?eGFR  ?Date Value Ref Range Status  ?06/22/2021 61 >59 mL/min/1.73 Final  ?   ?  ?  Passed - Valid encounter within last 6 months  ?  Recent Outpatient Visits   ?      ? 2 months ago Essential hypertension  ? [facility] Glean Hess, MD  ? 1 year ago Essential hypertension  ? [facility] Glean Hess, MD  ? 1 year ago Diarrhea, unspecified type  ? [facility] Glean Hess, MD  ? 1 year ago Essential hypertension  ? [facility] Glean Hess, MD  ? 1 year ago Pain of right thigh  ? [facility] Glean Hess, MD  ?  ?  ?Future Appointments   ?        ? In 1 month Flora Lipps, MD [facility] [place]  ? In 1 month Gollan, Kathlene November, MD [facility]  ? In 9 months Glean Hess, MD [facility], [place]  ?  ? ?  ?  ?  ? ?

## 2021-10-06 ENCOUNTER — Ambulatory Visit: Payer: 59 | Admitting: Internal Medicine

## 2021-10-26 ENCOUNTER — Encounter: Payer: Self-pay | Admitting: Internal Medicine

## 2021-10-26 ENCOUNTER — Ambulatory Visit (INDEPENDENT_AMBULATORY_CARE_PROVIDER_SITE_OTHER): Payer: Medicare Other | Admitting: Internal Medicine

## 2021-10-26 VITALS — BP 124/76 | HR 100 | Temp 97.8°F | Ht 66.0 in | Wt 243.2 lb

## 2021-10-26 DIAGNOSIS — J4520 Mild intermittent asthma, uncomplicated: Secondary | ICD-10-CM | POA: Diagnosis not present

## 2021-10-26 NOTE — Progress Notes (Signed)
Name: Audrey Martinez MRN: [id number] DOB: 09/11/1945      CONSULTATION DATE:10/26/2021  REFERRING MD :  Dr Rockey Situ   STUDIES:  NO CXR at this time   CHIEF COMPLAINT: Follow-up asthma   HISTORY OF PRESENT ILLNESS:  Patient is here today for follow-up assessment for asthma  previously seen 1 year ago  Doing well with inhalers at this time  No exacerbation at this time No evidence of heart failure at this time No evidence or signs of infection at this time No respiratory distress No fevers, chills, nausea, vomiting, diarrhea No evidence of lower extremity edema No evidence hemoptysis  PREVIOUS COVID 19 infection Sept 2021   Albuterol use is infrequent On PPI for GERD   Has dogs all her life-no allergies to dogs  Patient does not take any aspirin due to the fact that she states that she has an aspirin sensitivity that triggers her asthma     Review of Systems: Gen:  Denies  fever, sweats, chills weight loss  HEENT: Denies blurred vision, double vision, ear pain, eye pain, hearing loss, nose bleeds, sore throat Cardiac:  No dizziness, chest pain or heaviness, chest tightness,edema, No JVD Resp:   No cough, -sputum production, -shortness of breath,-wheezing, -hemoptysis,  Other:  All other systems negative  BP 124/76 (BP Location: Left Arm, Cuff Size: Normal)   Pulse 100   Temp 97.8 F (36.6 C) (Temporal)   Ht '5\' 6"'$  (1.676 m)   Wt 243 lb 3.2 oz (110.3 kg)   SpO2 95%   BMI 39.25 kg/m     Physical Examination:   General Appearance: No distress  EYES PERRLA, EOM intact.   NECK Supple, No JVD Pulmonary: normal breath sounds, No wheezing.  CardiovascularNormal S1,S2.  No m/r/g.    ALL OTHER ROS ARE NEGATIVE      Active Ambulatory Problems    Diagnosis Date Noted   Osteoporosis 03/21/2017   Personal history of transient ischemic attack (TIA), and cerebral infarction without residual deficits 04/22/2003   Hyperlipidemia 05/12/2003   Benign  essential tremor 03/21/2017   Atrial tachycardia ([place]) 04/14/2017   SVT (supraventricular tachycardia) ([place]) 04/14/2017   Essential hypertension 04/14/2017   Intermittent asthma without complication 00/86/7619   Lumbar radiculopathy 05/18/2017   Lumbar degenerative disc disease 05/18/2017   Spinal stenosis of lumbar region without neurogenic claudication 05/18/2017   Renal insufficiency 11/08/2017   Amaurosis fugax of right eye 01/13/2019   Dilated cardiomyopathy ([place]) 05/02/2019   Left lower lobe pulmonary nodule 01/29/2020   Atrial flutter ([place]) 01/31/2020   Obesity (BMI 30-39.9) 01/31/2020   Hx of atrioventricular node ablation 02/01/2008   Hx of colonic polyps    Trochanteric bursitis of right hip 05/18/2021   Melanoma in situ of neck ([place]) 06/22/2021   Resolved Ambulatory Problems    Diagnosis Date Noted   Tendinitis of right knee 07/03/2017   Arm pain, right 11/08/2017   Chronic obstructive pulmonary disease, unspecified COPD type ([place]) 10/08/2019   Pneumonia due to COVID-19 virus 01/26/2020   Shortness of breath    AKI (acute kidney injury) ([place])    Acute septic pulmonary embolism ([place])    Acute respiratory failure with hypoxia ([place]) 01/29/2020   Acute pulmonary embolus ([place]) 01/29/2020   Pulmonary embolus ([place]) 01/31/2020   Acute renal failure (ARF) ([place]) 01/31/2020   Other nonthrombocytopenic purpura ([place]) 06/22/2021   Past Medical History:  Diagnosis Date   Actinic keratosis    Amaurosis fugax, right eye 12/2018  Arthritis    Asthma    Basal cell carcinoma 07/2016   Cardiomyopathy ([place])    Coronary artery disease    Dyspnea    Dysrhythmia    GERD (gastroesophageal reflux disease)    Herniated disc, lumbar    Left wrist fracture    Melanoma ([place]) 05/12/2020   Stroke ([place]) 1998     ASSESSMENT AND PLAN 76 year old pleasant white female with a diagnosis of mild intermittent asthma  Mild intermittent asthma Seems to be well-controlled at this  time Continue inhalers as prescribed Avoid triggers Avoid secondhand smoke perfumes chemicals and dust Previously plan to hold Flovent and to assess respiratory status however patient did not stop Flovent and continues to take Flovent 2 puffs twice daily and Striverdi which is a long-acting beta agonist 2 puffs twice daily  At this time lets plan for Flovent 1 puff in the morning and 1 puff in the evening and to see if her respiratory status stays stable Continue Striverdi Respimat as prescribed    Allergic rhinitis Patient would like to stop Atrovent nasal spray and start Flonase Continue Singulair   GERD seems to be controlled Continue PPI   Obesity -recommend significant weight loss -recommend changing diet  Deconditioned state -Recommend increased daily activity and exercise    MEDICATION ADJUSTMENTS/LABS AND TESTS ORDERED:  Lets plan for Flovent 1 puff in the morning Flovent 1 puff in the evening  If this new plan does not work please resume Flovent 2 puffs twice daily    Continue with Striverdi Respimat as prescribed  Avoid triggers Avoid smoke   Follow-up with cardiology as scheduled  Patient satisfied with Plan of action and management. All questions answered  Follow-up in 1 year  Total time spent 21 minutes   [name] Patricia Pesa, M.D.  [facility]  Medical Director [place] Director [facility] Cardio-Pulmonary Department

## 2021-10-26 NOTE — Patient Instructions (Addendum)
Lets plan for Flovent 1 puff in the morning Flovent 1 puff in the evening  If this new plan does not work please resume Flovent 2 puffs twice daily    Continue with Striverdi Respimat as prescribed  Avoid triggers Avoid smoke   Follow-up with cardiology as scheduled

## 2021-11-08 NOTE — Progress Notes (Signed)
Cardiology Office Note  Date:  11/09/2021   ID:  Audrey, Martinez 13-Feb-1946, MRN [id number]  PCP:  Glean Hess, MD   Chief Complaint  Patient presents with   6 month follow up     Patient c/o bradycardia since the flovent dose cut to 1 puff twice a day at times. Medications reviewed by the patient verbally.     HPI:  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  Non-smoker  orthostasis Previous migraines Nonischemic cardiomyopathy ejection fraction 30 to 35% November 2020 Catheterization with nonobstructive coronary disease Ejection fraction remaining 30 to 35% in August 2021  COVID infection  in sept 2021 Bilateral pulmonary emboli, without right heart strain. Who presents for f/u of her atrial tachycardia, SVT, EF 30 to 35%, possible hypertensive cardiomyopathy, medication intolerances  Last seen in clinic by myself December 2022  In follow-up today reports feeling well overall Denies significant leg swelling, no shortness of breath, no PND orthopnea No regular exercise program Does not get outside very much otherwise would need her walker secondary to gait instability  She is concerned that a decrease in use of Flovent was affecting her heart rate  Lives alone, son helps Does her ADLs  EKG personally reviewed by myself on todays visit Normal sinus rhythm rate 93 bpm nonspecific interventricular conduction delay QRS 126 ms No change from prior EKGs  Other past medical history reviewed prior fall, mechanical ,  Hit chin, significant trauma, concussion In the ER did CT scan, right 4m tentorial SDH after a mechanical fall at home.  Seen by neurology  echo August 2021 ejection fraction 30 to 35%, same as 2020, normal right heart pressures  Echocardiogram April 17, 2019  with ejection fraction 30 to 35%, normal RV function  Cardiac catheterization performed for cardiomyopathy  Catheterization December 2020 Nonobstructive CAD, EF estimated at 30 to 35%, global hypokinesis,  LVEDP 17 to 20 mm Hg  episode of tachycardia, went to the ER 06/03/2018 Rhythm broke before she could get an EKG in the emergency room Rare episodes, does not remember the last time she had an episode  Right eye vision went dark, 01/02/2019 seen in the ER MRI in the ER, no CVA CT head Encephalomalacia changes in the left frontal region. Left eye: darkness "came down a bit"  zio monitor,  Normal sinus rhythm  avg HR of 89 bpm. 3 Ventricular Tachycardia runs occurred, the run with the fastest interval lasting 5 beats with a max rate of 200 bpm, the longest lasting 7 beats with an avg rate of 141 bpm.  Patient triggered events were not associated with significant arrhythmia.  Carotid: Minor carotid atherosclerosis. No hemodynamically significant ICA stenosis. Degree of narrowing less than 50% bilaterally by ultrasound criteria.   Echocardiogram May 18, 2018 Ejection fraction 45 to 50% mild diffuse hypokinesis unable to exclude regional wall motion abnormalities Concerning for anteroseptal and apical hypokinesis   Lab Results  Component Value Date   CHOL 226 (H) 06/22/2021   HDL 66 06/22/2021   LDLCALC 136 (H) 06/22/2021   TRIG 136 06/22/2021    Notes from CWisconsincardiologist Notes indicate 1. Dual AV nodal physiology. 2. Typical slow fast AV nodal reentrant tachycardia. 3. Nonsustained AT / No inducible atrial flutter  She underwent s/p Successful radiofrequency ablation of AV  nodal slow pathway.   She had recurrent symptoms shortly after, had event monitor  repeat EPS 01/21/08:  1. Markedly prolonged baseline AH interval (AV nodal function is subnormal)  2. No induceable tachycardia; Only induceable double echo beats As per EP,  event monitor with  ectopic atrial tachycardia versus atrial flutter with 2:1 block.   Notes indicating she did not want anticoagulation  PRIOR CARDIAC STUDIES: -ECHOCARDIOGRAM 12/13/06: LVEF 55-60%. No significant valvular abnormalities. Noted to have episodes of atrial flutter at controlled rate. -ETT 09/28/07: Bruce protocol 5:01 minutes. 7 METS. Maximal HR 179 bpm (111% MPHR). Pretest ECG normal with HR 96 bpm. No chest pain. No significant ischemia.  -STRESS ECHO 11/14/08: Bruce protocol 4:31 minutes. 4.6 METS. 113% MPHR. LVEF 60%. No significant ischemia.  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 -Hypertension -Hyperlipidemia -History of TIA -Asthma -History of migraines -Right cataract and history of right retinal bleeding over a year ago, now resolved -S/P umbilical hernia repair -S/P tonsillectomy -S/P multiple wrist surgeries -S/P excision of right supraclavicular neck mass (lymph node) -S/P cataract surgery   PMH:   has a past medical history of Actinic keratosis, Acute pulmonary embolus ([place]) (01/29/2020), Acute renal failure (ARF) ([place]) (01/31/2020), Acute respiratory failure with hypoxia ([place]) (01/29/2020), Acute septic pulmonary embolism ([place]), AKI (acute kidney injury) ([employer]), Amaurosis fugax, right eye (12/2018), Arthritis, Asthma, Basal cell carcinoma (07/2016), Cardiomyopathy ([place]), Coronary artery disease, Dyspnea, Dysrhythmia, GERD (gastroesophageal reflux disease), Herniated disc, lumbar, Hyperlipidemia, Left wrist fracture, Melanoma ([place]) (05/12/2020), Osteoporosis, Pneumonia due to COVID-19 virus (01/26/2020), Pulmonary embolus ([place]) (01/31/2020), Stroke ([place]) (1998), SVT (supraventricular tachycardia) ([place]), and Tendinitis of right knee (07/03/2017).  PSH:    Past Surgical History:  Procedure Laterality Date   BACK SURGERY  2017   L2-4 laminectomy and foraminal stenosis   BREAST CYST ASPIRATION Left 1977   CARDIAC CATHETERIZATION  2009   [place] in [place]  2009   CATARACT EXTRACTION, BILATERAL  2010   COLONOSCOPY WITH PROPOFOL N/A 03/21/2018   Procedure: COLONOSCOPY WITH Biopsy;  Surgeon: Lin Landsman, MD;  Location: [facility];  Service: Endoscopy;  Laterality: N/A;   COLONOSCOPY WITH PROPOFOL N/A 05/03/2021   Procedure: COLONOSCOPY WITH PROPOFOL;  Surgeon: Lin Landsman, MD;  Location: [facility] ENDOSCOPY;  Service: Gastroenterology;  Laterality: N/A;   CORONARY ANGIOPLASTY     derrick procedure  1977-78   bilateral    ESOPHAGOGASTRODUODENOSCOPY  2013   gastritis; done for complaint of dysphagia   ESOPHAGOGASTRODUODENOSCOPY (EGD) WITH PROPOFOL N/A 03/21/2018   Procedure: ESOPHAGOGASTRODUODENOSCOPY (EGD) WITH Biopsies;  Surgeon: Lin Landsman, MD;  Location: [facility];  Service: Endoscopy;  Laterality: N/A;  KEEP THIS PATIENT FIRST   JOINT REPLACEMENT     LEFT HEART CATH AND CORONARY ANGIOGRAPHY Left 05/02/2019   Procedure: LEFT HEART CATH AND CORONARY ANGIOGRAPHY;  Surgeon: Minna Merritts, MD;  Location: [facility];  Service: Cardiovascular;  Laterality: Left;   lymph node removal     neck   MOHS SURGERY  07/2016   BCCA nose   POLYPECTOMY N/A 03/21/2018   Procedure: POLYPECTOMY;  Surgeon: Lin Landsman, MD;  Location: [facility];  Service: Endoscopy;  Laterality: N/A;   REPLACEMENT TOTAL KNEE  2013   RT   TOTAL SHOULDER REPLACEMENT  2012   RT     Current Outpatient Medications  Medication Sig Dispense Refill   albuterol (PROVENTIL HFA;VENTOLIN HFA) 108 (90 Base)  MCG/ACT inhaler Inhale 1-2 puffs into the lungs every 6 (six) hours as needed for wheezing or shortness of breath.      B Complex-C (B-COMPLEX WITH VITAMIN C) tablet Take 1 tablet by mouth daily.      baclofen (LIORESAL) 10 MG tablet TAKE 1 TABLET BY MOUTH THREE TIMES A DAY 270 tablet 0   Bioflavonoid Products (ESTER-C) TABS Take 1 tablet by mouth 2 (two) times daily.       calcium carbonate (OS-CAL - DOSED IN MG OF ELEMENTAL CALCIUM) 1250 (500 Ca) MG tablet Take 1 tablet by mouth.     carvedilol (COREG) 12.5 MG tablet Take 1 tablet (12.5 mg total) by mouth 2 (two) times daily with a meal. 180 tablet 3   cholecalciferol (VITAMIN D) 25 MCG (1000 UT) tablet Take 1,000 Units by mouth daily.     Cranberry 1000 MG CAPS Take 1,000 mg by mouth daily.      ezetimibe (ZETIA) 10 MG tablet TAKE 1 TABLET BY MOUTH EVERY DAY 90 tablet 2   fluticasone (FLOVENT HFA) 110 MCG/ACT inhaler INHALE 2 PUFFS INTO THE LUNGS TWICE A DAY (Patient taking differently: INHALE 1 PUFFS INTO THE LUNGS TWICE A DAY) 36 g 2   gabapentin (NEURONTIN) 300 MG capsule TAKE 1 CAPSULE BY MOUTH 3 TIMES DAILY. AND TAKE 3 CAPSULES DAILY AT BEDTIME. 540 capsule 3   Ginger, Zingiber officinalis, (GINGER PO) Take 1,100 mg by mouth daily.      ipratropium (ATROVENT) 0.03 % nasal spray SMARTSIG:1-2 Spray(s) Both Nares 3 Times Daily     losartan (COZAAR) 25 MG tablet losartan 25 mg tablet  TAKE 1/2 TABLET BY MOUTH EVERY DAY     MAG ASPART-POTASSIUM ASPART PO Take 1 tablet by mouth 2 (two) times daily. 600 mg-198 mg     meloxicam (MOBIC) 15 MG tablet Take 1 tablet (15 mg total) by mouth daily as needed for pain. 30 tablet 0   Misc Natural Products (TART CHERRY ADVANCED PO) Take 1 capsule by mouth daily.      montelukast (SINGULAIR) 10 MG tablet Take 10 mg by mouth every evening.     Multiple Vitamins-Minerals (MULTIVITAMIN WITH MINERALS) tablet Take 1 tablet by mouth daily. Centrum Silver     Olodaterol HCl (STRIVERDI RESPIMAT) 2.5 MCG/ACT AERS Inhale 2 puffs into the lungs daily. USE AT THE SAME TIME EVERYDAY 12 g 2   Probiotic Product (PROBIOTIC DAILY PO) Take 1 capsule by mouth 2 (two) times a week.      rivaroxaban (XARELTO) 20 MG TABS tablet Take 1 tablet (20 mg total) by mouth as needed (PRN for long car or plane ride). 30 tablet 3   sacubitril-valsartan (ENTRESTO) 24-26 MG Take 1 tablet by mouth 2 (two) times  daily. 180 tablet 3   Turmeric 500 MG CAPS Take 500 mg by mouth daily.      zinc sulfate 220 (50 Zn) MG capsule Take 1 capsule (220 mg total) by mouth daily. 30 capsule 0   No current facility-administered medications for this visit.     Allergies:   Amoxicillin, Aspirin, Ciprofloxacin, Ciprofloxacin-dexamethasone, Crestor [rosuvastatin], Morphine and related, Doxycycline hyclate, Hydrochlorothiazide, Keflex [cephalexin], Lovastatin, Simvastatin, Erythromycin, Ibuprofen, Neomycin-bacitracin zn-polymyx, Omeprazole, and Tape   Social History:  The patient  reports that she has never smoked. She has never used smokeless tobacco. She reports that she does not currently use alcohol. She reports that she does not use drugs.   Family History:   family history includes CAD (age of  onset: 71) in her sister; Clotting disorder in her half-sister and half-sister; Diabetes in her mother; Stroke in her mother and sister; Stroke (age of onset: 64) in her sister; Valvular heart disease in her father.    Review of Systems: Review of Systems  Constitutional: Negative.   HENT: Negative.    Respiratory: Negative.    Cardiovascular: Negative.   Gastrointestinal: Negative.   Musculoskeletal: Negative.   Psychiatric/Behavioral: Negative.    All other systems reviewed and are negative.   PHYSICAL EXAM: VS:  BP 120/88 (BP Location: Left Arm, Patient Position: Sitting, Cuff Size: Large)   Pulse 93   Ht '5\' 7"'$  (1.702 m)   Wt 242 lb 2 oz (109.8 kg)   SpO2 98%   BMI 37.92 kg/m  , BMI Body mass index is 37.92 kg/m. Constitutional:  oriented to person, place, and time. No distress.  HENT:  Head: Grossly normal Eyes:  no discharge. No scleral icterus.  Neck: No JVD, no carotid bruits  Cardiovascular: Regular rate and rhythm, no murmurs appreciated Pulmonary/Chest: Clear to auscultation bilaterally, no wheezes or rails Abdominal: Soft.  no distension.  no tenderness.  Musculoskeletal: Normal range of  motion Neurological:  normal muscle tone. Coordination normal. No atrophy Skin: Skin warm and dry Psychiatric: normal affect, pleasant   Recent Labs: 03/31/2021: Hemoglobin 15.9; Magnesium 2.6; Platelets 250 06/22/2021: ALT 17; BUN 17; Creatinine, Ser 0.98; Potassium 4.4; Sodium 145    Lipid Panel Lab Results  Component Value Date   CHOL 226 (H) 06/22/2021   HDL 66 06/22/2021   LDLCALC 136 (H) 06/22/2021   TRIG 136 06/22/2021      Wt Readings from Last 3 Encounters:  11/09/21 242 lb 2 oz (109.8 kg)  10/26/21 243 lb 3.2 oz (110.3 kg)  06/22/21 246 lb 3.2 oz (111.7 kg)     ASSESSMENT AND PLAN:  covid 19 pneumonia, Hospitalized Sept 2021 Bilateral PE, treated with Xarelto Now Xarelto PRN for long car trips, 10 mg  SVT (supraventricular tachycardia) ([place]) -  Prior ablation  Prior tachycardia, symptoms improved with carvedilol up to 12.5 twice daily  Cardiomyopathy, dilated Nonischemic Ejection fraction 35% Continue Coreg 12.5 twice daily, Entresto 24/26 mg twice daily Recommend she add spironolactone 25 mg daily, BMP in 2 weeks In follow-up visit we will consider adding Jardiance 10 mg daily  Atrial tachycardia ([place]) -  Prior monitor showing short run VT and PVCs In the emergency room November 2022, Increase Coreg as above  Mixed hyperlipidemia - She does not want a statin She is willing to stay on Zetia  Personal history of transient ischemic attack (TIA), and cerebral infarction without residual deficits Does not want aspirin, Plavix  No documentation of atrial fibrillation or flutter She prefers Xarelto as needed  Reactive airway disease/asthma On inhalers, Recovered from COVID No recurrence of her symptoms Weight loss recommended   Total encounter time more than 30 minutes  Greater than 50% was spent in counseling and coordination of care with the patient   Orders Placed This Encounter  Procedures   EKG 12-Lead      Signed, Esmond Plants, M.D.,  Ph.D. 11/09/2021  [place], [place]

## 2021-11-09 ENCOUNTER — Ambulatory Visit (INDEPENDENT_AMBULATORY_CARE_PROVIDER_SITE_OTHER): Payer: Medicare Other | Admitting: Cardiovascular Disease

## 2021-11-09 ENCOUNTER — Encounter: Payer: Self-pay | Admitting: Cardiovascular Disease

## 2021-11-09 VITALS — BP 120/88 | HR 93 | Ht 67.0 in | Wt 242.1 lb

## 2021-11-09 DIAGNOSIS — I10 Essential (primary) hypertension: Secondary | ICD-10-CM

## 2021-11-09 DIAGNOSIS — I471 Supraventricular tachycardia: Secondary | ICD-10-CM

## 2021-11-09 DIAGNOSIS — I5020 Unspecified systolic (congestive) heart failure: Secondary | ICD-10-CM | POA: Diagnosis not present

## 2021-11-09 DIAGNOSIS — I420 Dilated cardiomyopathy: Secondary | ICD-10-CM

## 2021-11-09 DIAGNOSIS — I4729 Other ventricular tachycardia: Secondary | ICD-10-CM | POA: Diagnosis not present

## 2021-11-09 DIAGNOSIS — I2699 Other pulmonary embolism without acute cor pulmonale: Secondary | ICD-10-CM | POA: Diagnosis not present

## 2021-11-09 DIAGNOSIS — E782 Mixed hyperlipidemia: Secondary | ICD-10-CM | POA: Diagnosis not present

## 2021-11-09 DIAGNOSIS — I483 Typical atrial flutter: Secondary | ICD-10-CM | POA: Diagnosis not present

## 2021-11-09 DIAGNOSIS — I429 Cardiomyopathy, unspecified: Secondary | ICD-10-CM

## 2021-11-09 MED ORDER — SPIRONOLACTONE 25 MG PO TABS: 25.0000 mg | ORAL_TABLET | Freq: Every day | ORAL | 3 refills | Status: DC

## 2021-11-09 NOTE — Patient Instructions (Addendum)
Medication Instructions:  Make sure you are not on losartan Please start spironolactone 25 mg daily  If you need a refill on your cardiac medications before your next appointment, please call your pharmacy.   Lab work: Your physician recommends that you return for lab work (BMP) in: 2 weeks  - Please go to the [facility]. You will check in at the front desk to the right as you walk into the atrium. Valet Parking is offered if needed. - No appointment needed. You may go any day between 7 am and 6 pm.    Testing/Procedures: No new testing needed  Follow-Up: At [facility], you and your health needs are our priority.  As part of our continuing mission to provide you with exceptional heart care, we have created designated Provider Care Teams.  These Care Teams include your primary Cardiologist (physician) and Advanced Practice Providers (APPs -  Physician Assistants and Nurse Practitioners) who all work together to provide you with the care you need, when you need it.  You will need a follow up appointment in 2 month  Providers on your designated Care Team:   Murray Hodgkins, NP Christell Faith, PA-C Cadence Kathlen Mody, [place]  COVID-19 Vaccine Information can be found at: ShippingScam.co.uk For questions related to vaccine distribution or appointments, please email [email or web address] or call [phone].

## 2021-11-17 ENCOUNTER — Emergency Department: Payer: Medicare Other

## 2021-11-17 ENCOUNTER — Emergency Department
Admission: EM | Admit: 2021-11-17 | Discharge: 2021-11-17 | Disposition: A | Discharge status: Home / Self Care | Payer: Medicare Other | Attending: Emergency Medicine | Admitting: Emergency Medicine

## 2021-11-17 ENCOUNTER — Other Ambulatory Visit: Payer: Self-pay

## 2021-11-17 DIAGNOSIS — R202 Paresthesia of skin: Secondary | ICD-10-CM

## 2021-11-17 DIAGNOSIS — J45909 Unspecified asthma, uncomplicated: Secondary | ICD-10-CM | POA: Insufficient documentation

## 2021-11-17 DIAGNOSIS — I10 Essential (primary) hypertension: Secondary | ICD-10-CM | POA: Diagnosis not present

## 2021-11-17 DIAGNOSIS — I2510 Atherosclerotic heart disease of native coronary artery without angina pectoris: Secondary | ICD-10-CM | POA: Insufficient documentation

## 2021-11-17 DIAGNOSIS — I509 Heart failure, unspecified: Secondary | ICD-10-CM | POA: Diagnosis not present

## 2021-11-17 DIAGNOSIS — G9389 Other specified disorders of brain: Secondary | ICD-10-CM | POA: Diagnosis not present

## 2021-11-17 DIAGNOSIS — R6884 Jaw pain: Secondary | ICD-10-CM | POA: Diagnosis not present

## 2021-11-17 DIAGNOSIS — I110 Hypertensive heart disease with heart failure: Secondary | ICD-10-CM | POA: Insufficient documentation

## 2021-11-17 LAB — BASIC METABOLIC PANEL
Anion gap: 6 (ref 5–15)
BUN: 20 mg/dL (ref 8–23)
CO2: 26 mmol/L (ref 22–32)
Calcium: 9.6 mg/dL (ref 8.9–10.3)
Chloride: 110 mmol/L (ref 98–111)
Creatinine, Ser: 1.1 mg/dL — ABNORMAL HIGH (ref 0.44–1.00)
GFR, Estimated: 53 mL/min — ABNORMAL LOW (ref >60)
Glucose, Bld: 105 mg/dL — ABNORMAL HIGH (ref 70–99)
Potassium: 4.5 mmol/L (ref 3.5–5.1)
Sodium: 142 mmol/L (ref 135–145)

## 2021-11-17 LAB — CBC
HCT: 45.2 % (ref 36.0–46.0)
Hemoglobin: 14.6 g/dL (ref 12.0–15.0)
MCH: 29.6 pg (ref 26.0–34.0)
MCHC: 32.3 g/dL (ref 30.0–36.0)
MCV: 91.7 fL (ref 80.0–100.0)
Platelets: 188 10*3/uL (ref 150–400)
RBC: 4.93 MIL/uL (ref 3.87–5.11)
RDW: 11.9 % (ref 11.5–15.5)
WBC: 6 10*3/uL (ref 4.0–10.5)
nRBC: 0 % (ref 0.0–0.2)

## 2021-11-17 LAB — TROPONIN I (HIGH SENSITIVITY): Troponin I (High Sensitivity): 2 ng/L (ref <18)

## 2021-11-17 NOTE — ED Triage Notes (Signed)
Pt here with jaw numbness since last night. Pt denies pain or any other symptoms. Pt ambulatory to triage.

## 2021-11-17 NOTE — ED Notes (Signed)
Patient given discharge instructions, all questions answered. Patient in possession of all belongings, directed to the discharge area  

## 2021-11-17 NOTE — ED Notes (Signed)
Pt reports left jaw pain and numbness that started yesterday.

## 2021-11-17 NOTE — ED Provider Notes (Signed)
[facility] Provider Note    Event Date/Time   First MD Initiated Contact with Patient 11/17/21 1547     (approximate)   History   Chief Complaint Numbness   HPI  Audrey Martinez is a 76 y.o. female with past medical history of hypertension, Hyperlipidemia, CAD, CHF, SVT, atrial fibrillation, PE, and asthma who presents to the ED complaining of numbness.  Patient reports that she has been having 2 days of constant numbness to both sides of her jaw as well as a dull aching pain moving down both sides of her jaw.  Pain does not seem to be exacerbated or alleviated by anything in particular and she denies any associated chest pain or shortness of breath.  She has not had any vision changes, speech changes, numbness, or weakness.  She denies any recent trauma to her jaw and has not had any dental pain or swelling.     Physical Exam   Triage Vital Signs: ED Triage Vitals [11/17/21 1402]  Enc Vitals Group     BP 130/89     Pulse Rate 85     Resp 16     Temp 98.4 F (36.9 C)     Temp Source Oral     SpO2 94 %     Weight 242 lb 1 oz (109.8 kg)     Height '5\' 7"'$  (1.702 m)     Head Circumference      Peak Flow      Pain Score 0     Pain Loc      Pain Edu?      Excl. in [place]?     Most recent vital signs: Vitals:   11/17/21 1402  BP: 130/89  Pulse: 85  Resp: 16  Temp: 98.4 F (36.9 C)  SpO2: 94%    Constitutional: Alert and oriented. Eyes: Conjunctivae are normal. Head: Atraumatic. Nose: No congestion/rhinnorhea. Mouth/Throat: Mucous membranes are moist.  Posterior oropharynx clear with no erythema or edema noted.  No jaw tenderness, edema, or erythema noted. Cardiovascular: Normal rate, regular rhythm. Grossly normal heart sounds.  2+ radial pulses bilaterally. Respiratory: Normal respiratory effort.  No retractions. Lungs CTAB. Gastrointestinal: Soft and nontender. No distention. Musculoskeletal: No lower extremity tenderness nor edema.   Neurologic:  Normal speech and language. No gross focal neurologic deficits are appreciated.    ED Results / Procedures / Treatments   Labs (all labs ordered are listed, but only abnormal results are displayed) Labs Reviewed  BASIC METABOLIC PANEL - Abnormal; Notable for the following components:      Result Value   Glucose, Bld 105 (*)    Creatinine, Ser 1.10 (*)    GFR, Estimated 53 (*)    All other components within normal limits  CBC  TROPONIN I (HIGH SENSITIVITY)     EKG  ED ECG REPORT I, Blake Divine, the attending physician, personally viewed and interpreted this ECG.   Date: 11/17/2021  EKG Time: 16:28  Rate: 76  Rhythm: normal sinus rhythm  Axis: LAD  Intervals:first-degree A-V block   ST&T Change: Nonspecific T wave changes  RADIOLOGY CT head reviewed and interpreted by me with no hemorrhage or midline shift.  PROCEDURES:  Critical Care performed: No  Procedures   MEDICATIONS ORDERED IN ED: Medications - No data to display   IMPRESSION / MDM / [place] / ED COURSE  I reviewed the triage vital signs and the nursing notes.  76 y.o. female with past medical history of hypertension, hyperlipidemia, CAD, CHF, SVT, PE, and asthma who presents to the ED complaining of 2 days of constant jaw numbness and pain bilaterally.  Patient's presentation is most consistent with acute presentation with potential threat to life or bodily function.  Differential diagnosis includes, but is not limited to, ACS, stroke, TIA, electrolyte abnormality, TMJ, peripheral neuropathy.  Patient well-appearing and in no acute distress, vital signs are unremarkable.  She has subjective numbness over both sides of her jaw but no other focal neurologic deficits on exam.  Low suspicion for stroke given her bilateral symptoms, will check CT head for evidence of stroke or other intracranial abnormality.  Labs thus far are reassuring with no  significant anemia, leukocytosis, electrolyte abnormality, or AKI.  Given possible anginal equivalent, we will screen EKG and troponin but no other symptoms to suggest ACS.  EKG shows no evidence of arrhythmia or ischemia and troponin within normal limits, doubt cardiac etiology for her symptoms.  CT head is negative for acute process and I doubt stroke given 2 days of bilateral jaw symptoms.  Suspect TMJ pain versus peripheral neuropathy and she is appropriate for discharge home with PCP follow-up.  She was counseled to return to the ED for new or worsening symptoms, patient agrees with plan.      FINAL CLINICAL IMPRESSION(S) / ED DIAGNOSES   Final diagnoses:  Paresthesias  Jaw pain     Rx / DC Orders   ED Discharge Orders     None        Note:  This document was prepared using Dragon voice recognition software and may include unintentional dictation errors.   Blake Divine, MD 11/17/21 [phone]

## 2021-11-17 NOTE — ED Triage Notes (Addendum)
Had sharp pain in left jaw last night.  Then she had numbness to lower jaw on both sides and under jaw.  No facial droop.  No arm or leg deficits.  Patient is ambulatory with her cane.

## 2021-11-18 ENCOUNTER — Telehealth: Payer: Self-pay | Admitting: Cardiovascular Disease

## 2021-11-18 NOTE — Telephone Encounter (Signed)
Pt c/o medication issue:  1. Name of Medication:   spironolactone (ALDACTONE) 25 MG tablet  2. How are you currently taking this medication (dosage and times per day)? 1 tab daily  3. Are you having a reaction (difficulty breathing--STAT)?   No  4. What is your medication issue?   Patient stated she had been taking this med for 8 days and her lips and throat were turning numb.  Patient has stopped this medication and would like an alternative.

## 2021-11-18 NOTE — Telephone Encounter (Signed)
Spoke with pt. Pt went to the ER yesterday after noting numbness in lips and throat.  Pt had started Spironolactone 6 days prior per last ov with Dr. Rockey Situ 11/09/21.  ER had pt stop spironolactone.   Pt states numbness resolved yesterday prior to discharge.  While speaking with pt, pt states "I am beginning to feel the numbness return to my lips, and I just finished eating some dark chocolate covered raisins."   Pt states she also had the chocolate covered raisins yesterday as well prior to feeling numbness.   Pt denies swelling, rash or any further reaction.   Advised pt get otc Benadryl and take 25 mg tablet.  Precautions provided to pt re side effects of drowsiness.   Advised pt continue to refrain from taking Spironolactone until allergic symptoms have completely resolved even though it appears the chocolate she was eating the likely cause of reaction. And I will make Dr. Rockey Situ aware and will let him decide the plan moving forward.   Pt is aware to call 911 of any further reaction.  Pt will stop eating dark chocolate raisins.  Pt will wait to hear back with Dr. Donivan Scull recc moving forward.

## 2021-11-23 ENCOUNTER — Other Ambulatory Visit: Payer: Self-pay | Admitting: Cardiovascular Disease

## 2021-11-24 ENCOUNTER — Other Ambulatory Visit
Admission: RE | Admit: 2021-11-24 | Discharge: 2021-11-24 | Disposition: A | Discharge status: Home / Self Care | Payer: Medicare Other | Attending: Cardiovascular Disease | Admitting: Cardiovascular Disease

## 2021-11-24 DIAGNOSIS — I5020 Unspecified systolic (congestive) heart failure: Secondary | ICD-10-CM | POA: Diagnosis not present

## 2021-11-24 DIAGNOSIS — I471 Supraventricular tachycardia: Secondary | ICD-10-CM | POA: Diagnosis not present

## 2021-11-24 LAB — BASIC METABOLIC PANEL
Anion gap: 6 (ref 5–15)
BUN: 19 mg/dL (ref 8–23)
CO2: 23 mmol/L (ref 22–32)
Calcium: 9.7 mg/dL (ref 8.9–10.3)
Chloride: 113 mmol/L — ABNORMAL HIGH (ref 98–111)
Creatinine, Ser: 1.11 mg/dL — ABNORMAL HIGH (ref 0.44–1.00)
GFR, Estimated: 52 mL/min — ABNORMAL LOW (ref >60)
Glucose, Bld: 97 mg/dL (ref 70–99)
Potassium: 4.5 mmol/L (ref 3.5–5.1)
Sodium: 142 mmol/L (ref 135–145)

## 2021-11-29 ENCOUNTER — Other Ambulatory Visit: Payer: Self-pay | Admitting: Internal Medicine

## 2021-12-01 NOTE — Telephone Encounter (Signed)
Requested Prescriptions  Pending Prescriptions Disp Refills  . baclofen (LIORESAL) 10 MG tablet [Pharmacy Med Name: BACLOFEN 10 MG TABLET] 270 tablet 0    Sig: TAKE 1 TABLET BY MOUTH THREE TIMES A DAY     Analgesics:  Muscle Relaxants - baclofen Failed - 11/29/2021 11:46 AM      Failed - Cr in normal range and within 180 days    Creatinine, Ser  Date Value Ref Range Status  11/24/2021 1.11 (H) 0.44 - 1.00 mg/dL Final         Passed - eGFR is 30 or above and within 180 days    GFR calc Af Amer  Date Value Ref Range Status  02/01/2020 >60 >60 mL/min Final   GFR, Estimated  Date Value Ref Range Status  11/24/2021 52 (L) >60 mL/min Final    Comment:    (NOTE) Calculated using the CKD-EPI Creatinine Equation (2021)    eGFR  Date Value Ref Range Status  06/22/2021 61 >59 mL/min/1.73 Final         Passed - Valid encounter within last 6 months    Recent Outpatient Visits          5 months ago Essential hypertension   [facility] Berglund, Laura H, MD   1 year ago Essential hypertension   [facility] Berglund, Laura H, MD   1 year ago Diarrhea, unspecified type   [facility] Berglund, Laura H, MD   2 years ago Essential hypertension   [facility] Berglund, Laura H, MD   2 years ago Pain of right thigh   [facility] Berglund, Laura H, MD      Future Appointments            In 1 month Gollan, Timothy J, MD [facility] [place], [facility]   In 6 months Berglund, Laura H, MD [facility], [facility]            

## 2021-12-03 ENCOUNTER — Other Ambulatory Visit: Payer: Self-pay | Admitting: Internal Medicine

## 2021-12-03 NOTE — Telephone Encounter (Signed)
Requested Prescriptions  Pending Prescriptions Disp Refills  . meloxicam (MOBIC) 15 MG tablet [Pharmacy Med Name: MELOXICAM 15 MG TABLET] 30 tablet 0    Sig: TAKE 1 TABLET BY MOUTH EVERY DAY AS NEEDED FOR PAIN     Analgesics:  COX2 Inhibitors Failed - 12/03/2021  1:58 AM      Failed - Manual Review: Labs are only required if the patient has taken medication for more than 8 weeks.      Failed - Cr in normal range and within 360 days    Creatinine, Ser  Date Value Ref Range Status  11/24/2021 1.11 (H) 0.44 - 1.00 mg/dL Final         Passed - HGB in normal range and within 360 days    Hemoglobin  Date Value Ref Range Status  11/17/2021 14.6 12.0 - 15.0 g/dL Final  04/18/2019 15.7 11.1 - 15.9 g/dL Final         Passed - HCT in normal range and within 360 days    HCT  Date Value Ref Range Status  11/17/2021 45.2 36.0 - 46.0 % Final   Hematocrit  Date Value Ref Range Status  04/18/2019 45.8 34.0 - 46.6 % Final         Passed - AST in normal range and within 360 days    AST  Date Value Ref Range Status  06/22/2021 25 0 - 40 IU/L Final         Passed - ALT in normal range and within 360 days    ALT  Date Value Ref Range Status  06/22/2021 17 0 - 32 IU/L Final         Passed - eGFR is 30 or above and within 360 days    GFR calc Af Amer  Date Value Ref Range Status  02/01/2020 >60 >60 mL/min Final   GFR, Estimated  Date Value Ref Range Status  11/24/2021 52 (L) >60 mL/min Final    Comment:    (NOTE) Calculated using the CKD-EPI Creatinine Equation (2021)    eGFR  Date Value Ref Range Status  06/22/2021 61 >59 mL/min/1.73 Final         Passed - Patient is not pregnant      Passed - Valid encounter within last 12 months    Recent Outpatient Visits          5 months ago Essential hypertension   [place], Laura H, MD   1 year ago Essential hypertension   [place], Laura H, MD   1 year ago Diarrhea, unspecified type    [facility] Glean Hess, MD   2 years ago Essential hypertension   [facility] Glean Hess, MD   2 years ago Pain of right thigh   [facility] Glean Hess, MD      Future Appointments            In 1 month Gollan, Kathlene November, MD [facility], [facility]   In 6 months Army Melia, Jesse Sans, MD [facility], [place]

## 2022-01-28 ENCOUNTER — Encounter: Payer: Self-pay | Admitting: Cardiovascular Disease

## 2022-01-28 ENCOUNTER — Ambulatory Visit: Payer: Medicare Other | Attending: Cardiovascular Disease | Admitting: Cardiovascular Disease

## 2022-01-28 VITALS — BP 110/80 | HR 66 | Ht 66.0 in | Wt 241.4 lb

## 2022-01-28 DIAGNOSIS — I471 Supraventricular tachycardia: Secondary | ICD-10-CM | POA: Insufficient documentation

## 2022-01-28 DIAGNOSIS — I2699 Other pulmonary embolism without acute cor pulmonale: Secondary | ICD-10-CM | POA: Diagnosis not present

## 2022-01-28 DIAGNOSIS — I483 Typical atrial flutter: Secondary | ICD-10-CM | POA: Diagnosis not present

## 2022-01-28 DIAGNOSIS — I5020 Unspecified systolic (congestive) heart failure: Secondary | ICD-10-CM | POA: Insufficient documentation

## 2022-01-28 DIAGNOSIS — I10 Essential (primary) hypertension: Secondary | ICD-10-CM | POA: Insufficient documentation

## 2022-01-28 DIAGNOSIS — I420 Dilated cardiomyopathy: Secondary | ICD-10-CM | POA: Insufficient documentation

## 2022-01-28 DIAGNOSIS — E782 Mixed hyperlipidemia: Secondary | ICD-10-CM | POA: Diagnosis not present

## 2022-01-28 DIAGNOSIS — I4729 Other ventricular tachycardia: Secondary | ICD-10-CM | POA: Diagnosis not present

## 2022-01-28 DIAGNOSIS — I429 Cardiomyopathy, unspecified: Secondary | ICD-10-CM | POA: Insufficient documentation

## 2022-01-28 MED ORDER — EMPAGLIFLOZIN 10 MG PO TABS: ORAL_TABLET | ORAL | 1 refills | Status: DC

## 2022-01-28 MED ORDER — SPIRONOLACTONE 25 MG PO TABS: 25.0000 mg | ORAL_TABLET | Freq: Every day | ORAL | 3 refills | Status: DC

## 2022-01-28 NOTE — Patient Instructions (Addendum)
KC: Dr. Tressia Miners, Lorayne Marek   Medication Instructions:  - Your physician has recommended you make the following change in your medication:   1) START Jardiance 10 mg: - take 1 tablet by mouth once daily  Samples Given: Jardiance 10 mg Lot: [id number] Exp: January 2025 # 2 boxes given  If you need a refill on your cardiac medications before your next appointment, please call your pharmacy.    Lab work: No new labs needed   Testing/Procedures: No new testing needed   Follow-Up: At [facility], you and your health needs are our priority.  As part of our continuing mission to provide you with exceptional heart care, we have created designated Provider Care Teams.  These Care Teams include your primary Cardiologist (physician) and Advanced Practice Providers (APPs -  Physician Assistants and Nurse Practitioners) who all work together to provide you with the care you need, when you need it.  You will need a follow up appointment in 6 months  Providers on your designated Care Team:   Murray Hodgkins, NP Christell Faith, PA-C Cadence Kathlen Mody, [place]  COVID-19 Vaccine Information can be found at: [email or web address] For questions related to vaccine distribution or appointments, please email [email or web address] or call [phone].

## 2022-01-28 NOTE — Progress Notes (Signed)
Cardiology Office Note  Date:  01/28/2022   ID:  Audrey Martinez, Audrey Martinez 1945-11-30, MRN [id number]  PCP:  Glean Hess, MD   Chief Complaint  Patient presents with   2 month follow up     "Doing well." Medications reviewed by the patient verbally.     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  Non-smoker  orthostasis Previous migraines Nonischemic cardiomyopathy ejection fraction 30 to 35% November 2020 Catheterization with nonobstructive coronary disease Ejection fraction remaining 30 to 35% in August 2021 COVID infection  in sept 2021 Bilateral pulmonary emboli, without right heart strain. Who presents for f/u of her atrial tachycardia, SVT, EF 30 to 35%, possible hypertensive cardiomyopathy, medication intolerances  Last seen in clinic by myself December 2022 Feels well, Little orthostasis, reports it is manageable  No regular exercise program, uses a walker secondary to gait instability for longer walking Denies significant leg swelling, no PND orthopnea, no abdominal swelling  Lives alone, son helps her  Blood pressure measurements from home high 54O up to 270 systolic heart rate 35-00  EKG personally reviewed by myself on todays visit Normal sinus rhythm rate 66 bpm nonspecific T wave abnormality  Other past medical history reviewed prior fall, mechanical ,  Hit chin, significant trauma, concussion In the ER did CT scan, right 85m tentorial SDH after a mechanical fall at home.  Seen by neurology  echo August 2021 ejection fraction 30 to 35%, same as 2020, normal right heart pressures  Echocardiogram April 17, 2019  with ejection fraction 30 to 35%, normal RV function  Cardiac catheterization performed for cardiomyopathy  Catheterization December  2020 Nonobstructive CAD, EF estimated at 30 to 35%, global hypokinesis,  LVEDP 17 to 20 mm Hg  episode of tachycardia, went to the ER 06/03/2018 Rhythm broke before she could get an EKG in the emergency room Rare episodes, does not remember the last time she had an episode  Right eye vision went dark, 01/02/2019 seen in the ER MRI in the ER, no CVA CT head Encephalomalacia changes in the left frontal region. Left eye: darkness "came down a bit"  zio monitor,  Normal sinus rhythm  avg HR of 89 bpm. 3 Ventricular Tachycardia runs occurred, the run with the fastest interval lasting 5 beats with a max rate of 200 bpm, the longest lasting 7 beats with an avg rate of 141 bpm.  Patient triggered events were not associated with significant arrhythmia.  Carotid: Minor carotid atherosclerosis. No hemodynamically significant ICA stenosis. Degree of narrowing less than 50% bilaterally by ultrasound criteria.   Echocardiogram May 18, 2018 Ejection fraction 45 to 50% mild diffuse hypokinesis unable to exclude regional wall motion abnormalities Concerning for anteroseptal and apical hypokinesis   Lab Results  Component Value Date   CHOL 226 (H) 06/22/2021   HDL 66 06/22/2021   LDLCALC 136 (H) 06/22/2021   TRIG 136 06/22/2021    Notes from CWisconsincardiologist Notes indicate 1. Dual AV nodal physiology. 2. Typical slow fast AV nodal reentrant tachycardia. 3. Nonsustained AT / No inducible atrial flutter  She underwent s/p Successful radiofrequency ablation of AV nodal slow pathway.   She had recurrent symptoms shortly after, had event monitor  repeat EPS 01/21/08:  1. Markedly prolonged baseline AH interval (AV nodal function is subnormal)  2. No induceable tachycardia; Only induceable double echo beats As per EP,  event monitor with ectopic atrial tachycardia versus atrial flutter with 2:1 block.   Notes indicating she did not want anticoagulation  PRIOR CARDIAC  STUDIES: -ECHOCARDIOGRAM 12/13/06: LVEF 55-60%. No significant valvular abnormalities. Noted to have episodes of atrial flutter at controlled rate. -ETT 09/28/07: Bruce protocol 5:01 minutes. 7 METS. Maximal HR 179 bpm (111% MPHR). Pretest ECG normal with HR 96 bpm. No chest pain. No significant ischemia.  -STRESS ECHO 11/14/08: Bruce protocol 4:31 minutes. 4.6 METS. 113% MPHR. LVEF 60%. No significant ischemia.  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 -Hypertension -Hyperlipidemia -History of TIA -Asthma -History of migraines -Right cataract and history of right retinal bleeding over a year ago, now resolved -S/P umbilical hernia repair -S/P tonsillectomy -S/P multiple wrist surgeries -S/P excision of right supraclavicular neck mass (lymph node) -S/P cataract surgery   PMH:   has a past medical history of Actinic keratosis, Acute pulmonary embolus ([place]) (01/29/2020), Acute renal failure (ARF) ([place]) (01/31/2020), Acute respiratory failure with hypoxia ([place]) (01/29/2020), Acute septic pulmonary embolism ([place]), AKI (acute kidney injury) ([place]), Amaurosis fugax, right eye (12/2018), Arthritis, Asthma, Basal cell carcinoma (07/2016), Cardiomyopathy ([place]), Coronary artery disease, Dyspnea, Dysrhythmia, GERD (gastroesophageal reflux disease), Herniated disc, lumbar, Hyperlipidemia, Left wrist fracture, Melanoma ([place]) (05/12/2020), Osteoporosis, Pneumonia due to COVID-19 virus (01/26/2020), Pulmonary embolus ([place]) (01/31/2020), Stroke ([place]) (1998), SVT (supraventricular tachycardia) ([place]), and Tendinitis of right knee (07/03/2017).  PSH:    Past Surgical History:  Procedure Laterality Date   BACK SURGERY  2017   L2-4 laminectomy and foraminal stenosis   BREAST CYST ASPIRATION Left 1977   CARDIAC CATHETERIZATION  2009   [place]  2009   CATARACT EXTRACTION, BILATERAL  2010   COLONOSCOPY WITH PROPOFOL N/A 03/21/2018    Procedure: COLONOSCOPY WITH Biopsy;  Surgeon: Lin Landsman, MD;  Location: [facility];  Service: Endoscopy;  Laterality: N/A;   COLONOSCOPY WITH PROPOFOL N/A 05/03/2021   Procedure: COLONOSCOPY WITH PROPOFOL;  Surgeon: Lin Landsman, MD;  Location: [facility] ENDOSCOPY;  Service: Gastroenterology;  Laterality: N/A;   CORONARY ANGIOPLASTY     derrick procedure  1977-78   bilateral    ESOPHAGOGASTRODUODENOSCOPY  2013   gastritis; done for complaint of dysphagia   ESOPHAGOGASTRODUODENOSCOPY (EGD) WITH PROPOFOL N/A 03/21/2018   Procedure: ESOPHAGOGASTRODUODENOSCOPY (EGD) WITH Biopsies;  Surgeon: Lin Landsman, MD;  Location: [facility];  Service: Endoscopy;  Laterality: N/A;  KEEP THIS PATIENT FIRST   JOINT REPLACEMENT     LEFT HEART CATH AND CORONARY ANGIOGRAPHY Left 05/02/2019   Procedure: LEFT HEART CATH AND CORONARY ANGIOGRAPHY;  Surgeon: Minna Merritts, MD;  Location: [facility];  Service: Cardiovascular;  Laterality: Left;   lymph node removal     neck   MOHS SURGERY  07/2016   BCCA nose   POLYPECTOMY N/A 03/21/2018   Procedure: POLYPECTOMY;  Surgeon: Lin Landsman, MD;  Location: [facility];  Service: Endoscopy;  Laterality: N/A;   REPLACEMENT TOTAL KNEE  2013   RT   TOTAL SHOULDER REPLACEMENT  2012   RT     Current Outpatient Medications  Medication Sig Dispense Refill   albuterol (PROVENTIL HFA;VENTOLIN HFA) 108 (90 Base) MCG/ACT inhaler Inhale 1-2 puffs into the lungs every 6 (six) hours as needed for wheezing or shortness of breath.      B Complex-C (B-COMPLEX WITH VITAMIN C)  tablet Take 1 tablet by mouth daily.      baclofen (LIORESAL) 10 MG tablet TAKE 1 TABLET BY MOUTH THREE TIMES A DAY 270 tablet 0   Bioflavonoid Products (ESTER-C) TABS Take 1 tablet by mouth 2 (two) times daily.      calcium carbonate (OS-CAL - DOSED IN MG OF ELEMENTAL CALCIUM) 1250 (500 Ca) MG tablet Take 1 tablet by mouth.     carvedilol  (COREG) 12.5 MG tablet Take 1 tablet (12.5 mg total) by mouth 2 (two) times daily with a meal. 180 tablet 3   cholecalciferol (VITAMIN D) 25 MCG (1000 UT) tablet Take 1,000 Units by mouth daily.     Cranberry 1000 MG CAPS Take 1,000 mg by mouth daily.      ENTRESTO 24-26 MG TAKE 1 TABLET BY MOUTH TWICE A DAY 180 tablet 0   ezetimibe (ZETIA) 10 MG tablet TAKE 1 TABLET BY MOUTH EVERY DAY 90 tablet 2   fluticasone (FLOVENT HFA) 110 MCG/ACT inhaler INHALE 2 PUFFS INTO THE LUNGS TWICE A DAY (Patient taking differently: INHALE 1 PUFFS INTO THE LUNGS TWICE A DAY) 36 g 2   gabapentin (NEURONTIN) 300 MG capsule TAKE 1 CAPSULE BY MOUTH 3 TIMES DAILY. AND TAKE 3 CAPSULES DAILY AT BEDTIME. 540 capsule 3   Ginger, Zingiber officinalis, (GINGER PO) Take 1,100 mg by mouth daily.      ipratropium (ATROVENT) 0.03 % nasal spray SMARTSIG:1-2 Spray(s) Both Nares 3 Times Daily     MAG ASPART-POTASSIUM ASPART PO Take 1 tablet by mouth 2 (two) times daily. 600 mg-198 mg     meloxicam (MOBIC) 15 MG tablet TAKE 1 TABLET BY MOUTH EVERY DAY AS NEEDED FOR PAIN 90 tablet 1   Misc Natural Products (TART CHERRY ADVANCED PO) Take 1 capsule by mouth daily.      montelukast (SINGULAIR) 10 MG tablet Take 10 mg by mouth every evening.     Multiple Vitamins-Minerals (MULTIVITAMIN WITH MINERALS) tablet Take 1 tablet by mouth daily. Centrum Silver     Olodaterol HCl (STRIVERDI RESPIMAT) 2.5 MCG/ACT AERS Inhale 2 puffs into the lungs daily. USE AT THE SAME TIME EVERYDAY 12 g 2   Probiotic Product (PROBIOTIC DAILY PO) Take 1 capsule by mouth 2 (two) times a week.      spironolactone (ALDACTONE) 25 MG tablet Take 1 tablet (25 mg total) by mouth daily. 90 tablet 3   Turmeric 500 MG CAPS Take 500 mg by mouth daily.      zinc sulfate 220 (50 Zn) MG capsule Take 1 capsule (220 mg total) by mouth daily. 30 capsule 0   rivaroxaban (XARELTO) 20 MG TABS tablet Take 1 tablet (20 mg total) by mouth as needed (PRN for long car or plane ride).  (Patient not taking: Reported on 01/28/2022) 30 tablet 3   No current facility-administered medications for this visit.     Allergies:   Amoxicillin, Aspirin, Bacitracin-polymyx-neo-hc [bacitra-neomycin-polymyxin-hc], Ciprofloxacin, Ciprofloxacin-dexamethasone, Crestor [rosuvastatin], Erythromycin, Hydrochlorothiazide, Ibuprofen, Morphine and related, Morpholine salicylate, Omeprazole, Doxycycline hyclate, Keflex [cephalexin], Lovastatin, Simvastatin, Neomycin-bacitracin zn-polymyx, and Tape   Social History:  The patient  reports that she has never smoked. She has never used smokeless tobacco. She reports that she does not currently use alcohol. She reports that she does not use drugs.   Family History:   family history includes CAD (age of onset: 4) in her sister; Clotting disorder in her half-sister and half-sister; Diabetes in her mother; Stroke in her mother and sister; Stroke (age of onset: 62)  in her sister; Valvular heart disease in her father.    Review of Systems: Review of Systems  Constitutional: Negative.   HENT: Negative.    Respiratory: Negative.    Cardiovascular: Negative.   Gastrointestinal: Negative.   Musculoskeletal: Negative.   Psychiatric/Behavioral: Negative.    All other systems reviewed and are negative.   PHYSICAL EXAM: VS:  BP 110/80 (BP Location: Left Arm, Patient Position: Sitting, Cuff Size: Large)   Pulse 66   Ht '5\' 6"'$  (1.676 m)   Wt 241 lb 6 oz (109.5 kg)   SpO2 97%   BMI 38.96 kg/m  , BMI Body mass index is 38.96 kg/m. Constitutional:  oriented to person, place, and time. No distress.  HENT:  Head: Grossly normal Eyes:  no discharge. No scleral icterus.  Neck: No JVD, no carotid bruits  Cardiovascular: Regular rate and rhythm, no murmurs appreciated Pulmonary/Chest: Clear to auscultation bilaterally, no wheezes or rails Abdominal: Soft.  no distension.  no tenderness.  Musculoskeletal: Normal range of motion Neurological:  normal muscle  tone. Coordination normal. No atrophy Skin: Skin warm and dry Psychiatric: normal affect, pleasant  Recent Labs: 03/31/2021: Magnesium 2.6 06/22/2021: ALT 17 11/17/2021: Hemoglobin 14.6; Platelets 188 11/24/2021: BUN 19; Creatinine, Ser 1.11; Potassium 4.5; Sodium 142    Lipid Panel Lab Results  Component Value Date   CHOL 226 (H) 06/22/2021   HDL 66 06/22/2021   LDLCALC 136 (H) 06/22/2021   TRIG 136 06/22/2021    Wt Readings from Last 3 Encounters:  01/28/22 241 lb 6 oz (109.5 kg)  11/17/21 242 lb 1 oz (109.8 kg)  11/09/21 242 lb 2 oz (109.8 kg)     ASSESSMENT AND PLAN:  covid 19 pneumonia, Hospitalized Sept 2021 Bilateral PE, treated with Xarelto Taking Xarelto 10 mg PRN for long car trips Sedentary lifestyle  SVT (supraventricular tachycardia) ([place]) -  Prior ablation  Prior tachycardia,  No significant breakthrough in symptoms on carvedilol  Cardiomyopathy, dilated Nonischemic Ejection fraction 35% Continue Coreg 12.5 twice daily, Entresto 24/26 mg twice daily spironolactone 25 We have recommended that she start Jardiance 10 mg daily Little room to advance her medications given mild orthostasis symptoms, low blood pressure  Atrial tachycardia ([place]) -  Prior monitor showing short run VT and PVCs In the emergency room November 2022, Continue carvedilol  Mixed hyperlipidemia - She does not want a statin  on Zetia  Personal history of transient ischemic attack (TIA), and cerebral infarction without residual deficits Does not want aspirin, Plavix  No documentation of atrial fibrillation or flutter She prefers Xarelto as needed  Reactive airway disease/asthma On inhalers, Recovered from [place] symptoms are stable   Total encounter time more than 30 minutes  Greater than 50% was spent in counseling and coordination of care with the patient   No orders of the defined types were placed in this encounter.     Signed, Esmond Plants, M.D.,  Ph.D. 01/28/2022  [place], [place]

## 2022-02-08 DIAGNOSIS — M9904 Segmental and somatic dysfunction of sacral region: Secondary | ICD-10-CM | POA: Diagnosis not present

## 2022-02-08 DIAGNOSIS — M4726 Other spondylosis with radiculopathy, lumbar region: Secondary | ICD-10-CM | POA: Diagnosis not present

## 2022-02-08 DIAGNOSIS — M9903 Segmental and somatic dysfunction of lumbar region: Secondary | ICD-10-CM | POA: Diagnosis not present

## 2022-02-08 DIAGNOSIS — M4728 Other spondylosis with radiculopathy, sacral and sacrococcygeal region: Secondary | ICD-10-CM | POA: Diagnosis not present

## 2022-02-09 DIAGNOSIS — M4728 Other spondylosis with radiculopathy, sacral and sacrococcygeal region: Secondary | ICD-10-CM | POA: Diagnosis not present

## 2022-02-09 DIAGNOSIS — M9904 Segmental and somatic dysfunction of sacral region: Secondary | ICD-10-CM | POA: Diagnosis not present

## 2022-02-09 DIAGNOSIS — M4726 Other spondylosis with radiculopathy, lumbar region: Secondary | ICD-10-CM | POA: Diagnosis not present

## 2022-02-09 DIAGNOSIS — M9903 Segmental and somatic dysfunction of lumbar region: Secondary | ICD-10-CM | POA: Diagnosis not present

## 2022-02-12 ENCOUNTER — Observation Stay
Admission: EM | Admit: 2022-02-12 | Discharge: 2022-02-13 | Disposition: A | Discharge status: Home / Self Care | Payer: Medicare Other | Attending: Hospitalist | Admitting: Hospitalist

## 2022-02-12 ENCOUNTER — Emergency Department: Payer: Medicare Other

## 2022-02-12 ENCOUNTER — Other Ambulatory Visit: Payer: Self-pay

## 2022-02-12 DIAGNOSIS — Z955 Presence of coronary angioplasty implant and graft: Secondary | ICD-10-CM | POA: Insufficient documentation

## 2022-02-12 DIAGNOSIS — Z7984 Long term (current) use of oral hypoglycemic drugs: Secondary | ICD-10-CM | POA: Diagnosis not present

## 2022-02-12 DIAGNOSIS — Z96611 Presence of right artificial shoulder joint: Secondary | ICD-10-CM | POA: Insufficient documentation

## 2022-02-12 DIAGNOSIS — I959 Hypotension, unspecified: Secondary | ICD-10-CM | POA: Diagnosis present

## 2022-02-12 DIAGNOSIS — Z79899 Other long term (current) drug therapy: Secondary | ICD-10-CM | POA: Insufficient documentation

## 2022-02-12 DIAGNOSIS — J45909 Unspecified asthma, uncomplicated: Secondary | ICD-10-CM | POA: Insufficient documentation

## 2022-02-12 DIAGNOSIS — Z8673 Personal history of transient ischemic attack (TIA), and cerebral infarction without residual deficits: Secondary | ICD-10-CM | POA: Diagnosis not present

## 2022-02-12 DIAGNOSIS — Z96651 Presence of right artificial knee joint: Secondary | ICD-10-CM | POA: Diagnosis not present

## 2022-02-12 DIAGNOSIS — R55 Syncope and collapse: Secondary | ICD-10-CM

## 2022-02-12 DIAGNOSIS — R200 Anesthesia of skin: Secondary | ICD-10-CM | POA: Diagnosis not present

## 2022-02-12 DIAGNOSIS — Z20822 Contact with and (suspected) exposure to covid-19: Secondary | ICD-10-CM | POA: Diagnosis not present

## 2022-02-12 DIAGNOSIS — Z85820 Personal history of malignant melanoma of skin: Secondary | ICD-10-CM | POA: Insufficient documentation

## 2022-02-12 DIAGNOSIS — Z8616 Personal history of COVID-19: Secondary | ICD-10-CM | POA: Insufficient documentation

## 2022-02-12 DIAGNOSIS — R531 Weakness: Secondary | ICD-10-CM | POA: Diagnosis not present

## 2022-02-12 DIAGNOSIS — R42 Dizziness and giddiness: Secondary | ICD-10-CM | POA: Diagnosis present

## 2022-02-12 DIAGNOSIS — M79601 Pain in right arm: Secondary | ICD-10-CM

## 2022-02-12 DIAGNOSIS — J4520 Mild intermittent asthma, uncomplicated: Secondary | ICD-10-CM

## 2022-02-12 DIAGNOSIS — I951 Orthostatic hypotension: Secondary | ICD-10-CM | POA: Diagnosis not present

## 2022-02-12 DIAGNOSIS — Z86711 Personal history of pulmonary embolism: Secondary | ICD-10-CM | POA: Insufficient documentation

## 2022-02-12 DIAGNOSIS — E861 Hypovolemia: Secondary | ICD-10-CM

## 2022-02-12 DIAGNOSIS — N179 Acute kidney failure, unspecified: Secondary | ICD-10-CM | POA: Diagnosis not present

## 2022-02-12 DIAGNOSIS — I2510 Atherosclerotic heart disease of native coronary artery without angina pectoris: Secondary | ICD-10-CM | POA: Diagnosis not present

## 2022-02-12 DIAGNOSIS — Z85828 Personal history of other malignant neoplasm of skin: Secondary | ICD-10-CM | POA: Insufficient documentation

## 2022-02-12 DIAGNOSIS — I9589 Other hypotension: Secondary | ICD-10-CM | POA: Diagnosis not present

## 2022-02-12 LAB — CBC
HCT: 45.4 % (ref 36.0–46.0)
Hemoglobin: 15.3 g/dL — ABNORMAL HIGH (ref 12.0–15.0)
MCH: 30.7 pg (ref 26.0–34.0)
MCHC: 33.7 g/dL (ref 30.0–36.0)
MCV: 91 fL (ref 80.0–100.0)
Platelets: 171 10*3/uL (ref 150–400)
RBC: 4.99 MIL/uL (ref 3.87–5.11)
RDW: 12 % (ref 11.5–15.5)
WBC: 6.4 10*3/uL (ref 4.0–10.5)
nRBC: 0 % (ref 0.0–0.2)

## 2022-02-12 LAB — COMPREHENSIVE METABOLIC PANEL
ALT: 16 U/L (ref 0–44)
AST: 23 U/L (ref 15–41)
Albumin: 3.9 g/dL (ref 3.5–5.0)
Alkaline Phosphatase: 49 U/L (ref 38–126)
Anion gap: 8 (ref 5–15)
BUN: 24 mg/dL — ABNORMAL HIGH (ref 8–23)
CO2: 24 mmol/L (ref 22–32)
Calcium: 9.3 mg/dL (ref 8.9–10.3)
Chloride: 111 mmol/L (ref 98–111)
Creatinine, Ser: 1.52 mg/dL — ABNORMAL HIGH (ref 0.44–1.00)
GFR, Estimated: 36 mL/min — ABNORMAL LOW (ref >60)
Glucose, Bld: 106 mg/dL — ABNORMAL HIGH (ref 70–99)
Potassium: 4.6 mmol/L (ref 3.5–5.1)
Sodium: 143 mmol/L (ref 135–145)
Total Bilirubin: 1.2 mg/dL (ref 0.3–1.2)
Total Protein: 6.3 g/dL — ABNORMAL LOW (ref 6.5–8.1)

## 2022-02-12 LAB — CBC WITH DIFFERENTIAL/PLATELET
Abs Immature Granulocytes: 0.02 10*3/uL (ref 0.00–0.07)
Basophils Absolute: 0 10*3/uL (ref 0.0–0.1)
Basophils Relative: 0 %
Eosinophils Absolute: 0.2 10*3/uL (ref 0.0–0.5)
Eosinophils Relative: 3 %
HCT: 46.4 % — ABNORMAL HIGH (ref 36.0–46.0)
Hemoglobin: 15.1 g/dL — ABNORMAL HIGH (ref 12.0–15.0)
Immature Granulocytes: 0 %
Lymphocytes Relative: 22 %
Lymphs Abs: 1.7 10*3/uL (ref 0.7–4.0)
MCH: 30.4 pg (ref 26.0–34.0)
MCHC: 32.5 g/dL (ref 30.0–36.0)
MCV: 93.5 fL (ref 80.0–100.0)
Monocytes Absolute: 0.8 10*3/uL (ref 0.1–1.0)
Monocytes Relative: 10 %
Neutro Abs: 4.8 10*3/uL (ref 1.7–7.7)
Neutrophils Relative %: 65 %
Platelets: 200 10*3/uL (ref 150–400)
RBC: 4.96 MIL/uL (ref 3.87–5.11)
RDW: 11.9 % (ref 11.5–15.5)
WBC: 7.4 10*3/uL (ref 4.0–10.5)
nRBC: 0 % (ref 0.0–0.2)

## 2022-02-12 LAB — BLOOD GAS, VENOUS
Acid-Base Excess: 2.5 mmol/L — ABNORMAL HIGH (ref 0.0–2.0)
Bicarbonate: 27.9 mmol/L (ref 20.0–28.0)
O2 Saturation: 55.2 %
Patient temperature: 37
pCO2, Ven: 45 mmHg (ref 44–60)
pH, Ven: 7.4 (ref 7.25–7.43)
pO2, Ven: 33 mmHg (ref 32–45)

## 2022-02-12 LAB — CREATININE, SERUM
Creatinine, Ser: 1.27 mg/dL — ABNORMAL HIGH (ref 0.44–1.00)
GFR, Estimated: 44 mL/min — ABNORMAL LOW (ref >60)

## 2022-02-12 LAB — LACTIC ACID, PLASMA: Lactic Acid, Venous: 1.4 mmol/L (ref 0.5–1.9)

## 2022-02-12 LAB — TROPONIN I (HIGH SENSITIVITY)
Troponin I (High Sensitivity): 3 ng/L (ref <18)
Troponin I (High Sensitivity): 3 ng/L (ref <18)

## 2022-02-12 LAB — SARS CORONAVIRUS 2 BY RT PCR: SARS Coronavirus 2 by RT PCR: NEGATIVE

## 2022-02-12 MED ORDER — EZETIMIBE 10 MG PO TABS
10.0000 mg | ORAL_TABLET | Freq: Every day | ORAL | Status: DC
  Administered 2022-02-12 – 2022-02-13 (×2): 10 mg via ORAL
  Filled 2022-02-12 (×2): qty 1

## 2022-02-12 MED ORDER — BUDESONIDE 0.25 MG/2ML IN SUSP
2.0000 mL | Freq: Two times a day (BID) | RESPIRATORY_TRACT | Status: DC
  Administered 2022-02-12 – 2022-02-13 (×2): 0.25 mg via RESPIRATORY_TRACT
  Filled 2022-02-12 (×2): qty 2

## 2022-02-12 MED ORDER — ARFORMOTEROL TARTRATE 15 MCG/2ML IN NEBU: 15.0000 ug | INHALATION_SOLUTION | Freq: Two times a day (BID) | RESPIRATORY_TRACT | Status: DC

## 2022-02-12 MED ORDER — SODIUM CHLORIDE 0.9 % IV BOLUS
1000.0000 mL | Freq: Once | INTRAVENOUS | Status: AC
  Administered 2022-02-12: 1000 mL via INTRAVENOUS

## 2022-02-12 MED ORDER — CARVEDILOL 12.5 MG PO TABS
12.5000 mg | ORAL_TABLET | Freq: Two times a day (BID) | ORAL | Status: DC
  Administered 2022-02-12 – 2022-02-13 (×2): 12.5 mg via ORAL
  Filled 2022-02-12 (×2): qty 1

## 2022-02-12 MED ORDER — CARVEDILOL 12.5 MG PO TABS: 12.5000 mg | ORAL_TABLET | Freq: Two times a day (BID) | ORAL | Status: DC

## 2022-02-12 MED ORDER — MONTELUKAST SODIUM 10 MG PO TABS
10.0000 mg | ORAL_TABLET | Freq: Every evening | ORAL | Status: DC
  Administered 2022-02-12: 10 mg via ORAL
  Filled 2022-02-12: qty 1

## 2022-02-12 MED ORDER — GABAPENTIN 300 MG PO CAPS
300.0000 mg | ORAL_CAPSULE | Freq: Two times a day (BID) | ORAL | Status: DC
  Administered 2022-02-12 – 2022-02-13 (×2): 300 mg via ORAL
  Filled 2022-02-12 (×2): qty 1

## 2022-02-12 MED ORDER — ALBUTEROL SULFATE (2.5 MG/3ML) 0.083% IN NEBU: 3.0000 mL | INHALATION_SOLUTION | Freq: Four times a day (QID) | RESPIRATORY_TRACT | Status: DC | PRN

## 2022-02-12 MED ORDER — BACLOFEN 10 MG PO TABS
10.0000 mg | ORAL_TABLET | Freq: Three times a day (TID) | ORAL | Status: DC
  Administered 2022-02-12 – 2022-02-13 (×2): 10 mg via ORAL
  Filled 2022-02-12 (×2): qty 1

## 2022-02-12 MED ORDER — IPRATROPIUM BROMIDE 0.03 % NA SOLN
1.0000 | Freq: Two times a day (BID) | NASAL | Status: DC
  Administered 2022-02-13: 1 via NASAL
  Filled 2022-02-12: qty 30

## 2022-02-12 MED ORDER — HEPARIN SODIUM (PORCINE) 5000 UNIT/ML IJ SOLN
5000.0000 [IU] | Freq: Three times a day (TID) | INTRAMUSCULAR | Status: DC
  Administered 2022-02-12 – 2022-02-13 (×2): 5000 [IU] via SUBCUTANEOUS
  Filled 2022-02-12 (×2): qty 1

## 2022-02-12 NOTE — ED Triage Notes (Addendum)
Pt comes with c/o unable to get a BP reading at home. Pt states numbness to lip and jaw. Pt states she got up at 930am and went to stand up and fell back in bed. Pt states she then noticed the numbness. Pt states still little numb. Pt denies any dizziness just concerned that she couldn't get a BP reading.   Pt denies any slurred speech or weakness. Pt speaking in full sentences.

## 2022-02-12 NOTE — ED Provider Notes (Signed)
[facility] Provider Note    Event Date/Time   First MD Initiated Contact with Patient 02/12/22 1252     (approximate)   History   Dizziness   HPI  Smrithi Monee Dembeck is a 76 y.o. female who reports that she got up at 44 and went to stand up and got very weak and fell back into the bed.  She noticed some numbness in her chin.  This was on both sides.  She said it progressed up to her cheekbones and then after give her fluid in her blood pressure came up some it improved.  She denies any fever or cough or shortness of breath or chest pain or tightness or belly pain.  She is very weak and could not stand.     Physical Exam   Triage Vital Signs: ED Triage Vitals  Enc Vitals Group     BP 02/12/22 1249 (!) 87/50     Pulse Rate 02/12/22 1249 84     Resp 02/12/22 1249 (!) 23     Temp 02/12/22 1249 97.7 F (36.5 C)     Temp Source 02/12/22 1249 Oral     SpO2 02/12/22 1249 94 %     Weight --      Height --      Head Circumference --      Peak Flow --      Pain Score 02/12/22 1242 0     Pain Loc --      Pain Edu? --      Excl. in [place]? --     Most recent vital signs: Vitals:   02/12/22 1415 02/12/22 1430  BP: 102/71 (!) 89/67  Pulse: 83 87  Resp: (!) 21 20  Temp:    SpO2: 98% 95%    General: Awake, no distress.  Head normocephalic atraumatic CV:  Good peripheral perfusion.  Heart regular rate and rhythm no audible murmurs Resp:  Normal effort.  Lungs are clear Abd:  No distention. \Soft and nontender   ED Results / Procedures / Treatments   Labs (all labs ordered are listed, but only abnormal results are displayed) Labs Reviewed  COMPREHENSIVE METABOLIC PANEL - Abnormal; Notable for the following components:      Result Value   Glucose, Bld 106 (*)    BUN 24 (*)    Creatinine, Ser 1.52 (*)    Total Protein 6.3 (*)    GFR, Estimated 36 (*)    All other components within normal limits  CBC WITH DIFFERENTIAL/PLATELET - Abnormal;  Notable for the following components:   Hemoglobin 15.1 (*)    HCT 46.4 (*)    All other components within normal limits  BLOOD GAS, VENOUS - Abnormal; Notable for the following components:   Acid-Base Excess 2.5 (*)    All other components within normal limits  SARS CORONAVIRUS 2 BY RT PCR  LACTIC ACID, PLASMA  URINALYSIS, ROUTINE W REFLEX MICROSCOPIC  LACTIC ACID, PLASMA  CBG MONITORING, ED  TROPONIN I (HIGH SENSITIVITY)  TROPONIN I (HIGH SENSITIVITY)     EKG  EKG read and interpreted by me shows normal sinus rhythm rate of 75 left axis nonspecific ST-T wave changes   RADIOLOGY Chest x-ray pending   PROCEDURES:  Critical Care performed:   Procedures   MEDICATIONS ORDERED IN ED: Medications  sodium chloride 0.9 % bolus 1,000 mL (has no administration in time range)  sodium chloride 0.9 % bolus 1,000 mL (0 mLs Intravenous Stopped 02/12/22  1447)     IMPRESSION / MDM / ASSESSMENT AND PLAN / ED COURSE  I reviewed the triage vital signs and the nursing notes. Patient's numbness improved with fluids when her blood pressure came up.  Is now back down somewhat.  She has some AKI on her labs.  Her normal white count and differential may be doubt any infection.  She has not urinated for [place] yet.  Her troponin is negative her EKG does not show anything acute that I can see.  Venous gas is okay as well.  Differential diagnosis includes, but is not limited to, hypotension from dehydration.  This is the most likely.  She has no belly pain no signs of infection no shortness of breath or cough or chest discomfort.  Her EKG does not show anything acute her troponin is normal.  With a high normal H&H I do not suspect any bleeding.  Patient does not recall any black or tarry stools.  He has had no stools here.  Patient's presentation is most consistent with acute presentation with potential threat to life or bodily function.  The patient is on the cardiac monitor to evaluate for evidence  of arrhythmia and/or significant heart rate changes.  None have been seen.   We will get the patient in the hospital and monitor her give her some more fluids and see if we can determine why she dropped her pressure.   FINAL CLINICAL IMPRESSION(S) / ED DIAGNOSES   Final diagnoses:  Hypotension, unspecified hypotension type  Near syncope  AKI (acute kidney injury) ([place])     Rx / DC Orders   ED Discharge Orders     None        Note:  This document was prepared using Dragon voice recognition software and may include unintentional dictation errors.   Nena Polio, MD 02/12/22 1544

## 2022-02-12 NOTE — H&P (Addendum)
History and Physical    Patient: Audrey Martinez EGB:[id number] DOB: [birth year] DOA: 02/12/2022 DOS: the patient was seen and examined on 02/12/2022 PCP: Glean Hess, MD  Patient coming from: Home  Chief Complaint:  Chief Complaint  Patient presents with   Dizziness   HPI: Audrey Martinez is a 76 y.o. female with medical history significant of heart failure, coronary artery disease, history of PE not on anticoagulation, SVT status post ablations, reflux, high cholesterol, asthma here for suspected orthostatic hypotension.  This morning had a strange sensation that she cannot completely describe while getting up out of bed this morning.  She felt fully normal yesterday.  This morning she woke up at 415 to walk her dogs and felt normal as well.  She went back to bed and as she was getting up at 915 she she stood up and felt off and fell backward into her bed.  She did not sustain any injuries and did not lose consciousness.  She does not describe lightheadedness, weakness in her leg but felt like she could not continue standing up so fell backwards.  She also reports associated numbness in bilateral lips rating down to jaw and up her cheeks.  No other weaknesses, vision loss, slurred speech, acute memory issues, confusion.  She also denies chest pain, chest pressure, SOB, palpitations, LE edema, orthopnea, PND, syncope, presyncope, fevers, chills, urinary or abdominal symptoms, no blood in stool or dark stools  She normally has a low blood pressure but does not remember measurements she records at home.  Today when she tried to measure her blood pressure her blood pressure cuff was unable to read her blood pressure.  She reports addition of Jardiance 2 weeks ago but otherwise no other new medications.  She reports no change to her medications otherwise.  ED course: Given 2 L normal saline   Review of Systems: As mentioned in the history of present illness. All other systems reviewed  and are negative. Past Medical History:  Diagnosis Date   Actinic keratosis    Acute pulmonary embolus ([place]) 01/29/2020   Acute renal failure (ARF) ([place]) 01/31/2020   Acute respiratory failure with hypoxia ([place]) 01/29/2020   Acute septic pulmonary embolism ([facility])    AKI (acute kidney injury) ([place])    Amaurosis fugax, right eye 12/2018   Arthritis    Asthma    Basal cell carcinoma 07/2016   central forehead (tx in [place])   Cardiomyopathy [facility])    a. 04/2018 Echo: EF 45-50%. Anteroseptal and apical HK in some views. Mild AI/MR. Nl RV fxn; b. 05/2018 MV: mid-dist ant and antsept mild defect - ? breast atten. EF 41%. No ischemia; c. 03/2019 Echo: EF 30-35%, Gr2 DD, Trace MR, triv TR. Asc Ao ectatic dil - 52m.    Coronary artery disease    Dyspnea    Dysrhythmia    GERD (gastroesophageal reflux disease)    Herniated disc, lumbar    Hyperlipidemia    Left wrist fracture    Melanoma ([facility] 05/12/2020   Melanoma IS R post neck, excised 08/10/20   Osteoporosis    Pneumonia due to COVID-19 virus 01/26/2020   Pulmonary embolus ([facility] 93/11/1060  Complication of [id number]-46infection 12/2019:  Xarelto 20 mg daily x 6 months then PRN   Stroke ([facility] [place] 1998   SVT (supraventricular tachycardia) ([facility]    a.2008 s/p RFCA for AVNRT; b. 9 & 02/2019 Zio x 2: 1. Avg HR 89, 3 runs NSVT (longest 7 beats),  Mobitz 1. 2. Avg HR 89, NSVT x 1 (5 beats), Mobitz 1. No signif arrhythmia.   Tendinitis of right knee 07/03/2017   Past Surgical History:  Procedure Laterality Date   BACK SURGERY  2017   L2-4 laminectomy and foraminal stenosis   BREAST CYST ASPIRATION Left 1977   CARDIAC CATHETERIZATION  2009   [facility] in [place]  2009   CATARACT EXTRACTION, BILATERAL  2010   COLONOSCOPY WITH PROPOFOL N/A 03/21/2018   Procedure: COLONOSCOPY WITH Biopsy;  Surgeon: Lin Landsman, MD;  Location: [facility];  Service: Endoscopy;   Laterality: N/A;   COLONOSCOPY WITH PROPOFOL N/A 05/03/2021   Procedure: COLONOSCOPY WITH PROPOFOL;  Surgeon: Lin Landsman, MD;  Location: [facility] ENDOSCOPY;  Service: Gastroenterology;  Laterality: N/A;   CORONARY ANGIOPLASTY     derrick procedure  1977-78   bilateral    ESOPHAGOGASTRODUODENOSCOPY  2013   gastritis; done for complaint of dysphagia   ESOPHAGOGASTRODUODENOSCOPY (EGD) WITH PROPOFOL N/A 03/21/2018   Procedure: ESOPHAGOGASTRODUODENOSCOPY (EGD) WITH Biopsies;  Surgeon: Lin Landsman, MD;  Location: [place];  Service: Endoscopy;  Laterality: N/A;  KEEP THIS PATIENT FIRST   JOINT REPLACEMENT     LEFT HEART CATH AND CORONARY ANGIOGRAPHY Left 05/02/2019   Procedure: LEFT HEART CATH AND CORONARY ANGIOGRAPHY;  Surgeon: Minna Merritts, MD;  Location: [place];  Service: Cardiovascular;  Laterality: Left;   lymph node removal     neck   MOHS SURGERY  07/2016   BCCA nose   POLYPECTOMY N/A 03/21/2018   Procedure: POLYPECTOMY;  Surgeon: Lin Landsman, MD;  Location: [place];  Service: Endoscopy;  Laterality: N/A;   REPLACEMENT TOTAL KNEE  2013   RT   TOTAL SHOULDER REPLACEMENT  2012   RT    Social History:  reports that she has never smoked. She has never used smokeless tobacco. She reports that she does not currently use alcohol. She reports that she does not use drugs.  Allergies  Allergen Reactions   Amoxicillin Hives, Shortness Of Breath and Swelling    Did it involve swelling of the face/tongue/throat, SOB, or low BP? Yes Did it involve sudden or severe rash/hives, skin peeling, or any reaction on the inside of your mouth or nose? Yes Did you need to seek medical attention at a hospital or doctor's office? Yes When did it last happen? 1998      If all above answers are "NO", may proceed with cephalosporin use.    Aspirin Anaphylaxis and Shortness Of Breath   Bacitracin-Polymyx-Neo-Hc [Bacitra-Neomycin-Polymyxin-Hc] Rash    Ciprofloxacin Rash   Ciprofloxacin-Dexamethasone Anaphylaxis   Crestor [Rosuvastatin] Other (See Comments)    Caused problems with memory.   Erythromycin Hives and Rash   Hydrochlorothiazide Other (See Comments) and Palpitations    Raises her blood pressure.    Ibuprofen Other (See Comments)    Sharp stomach pains   Morphine And Related Nausea And Vomiting   Morpholine Salicylate Nausea Only   Omeprazole Palpitations and Other (See Comments)    Tachycardia and same sensation as her SVTs per patient   Doxycycline Hyclate Hives   Keflex [Cephalexin] Hives   Lovastatin Other (See Comments)    "Unable to function"   Simvastatin Other (See Comments)    "Unable to function"   Neomycin-Bacitracin Zn-Polymyx Hives and Rash   Tape Hives and Rash    Family History  Problem Relation Age of Onset  Diabetes Mother    Stroke Mother    Valvular heart disease Father    CAD Sister 70       died of MI   Stroke Sister    Stroke Sister 31   Clotting disorder Half-Sister    Clotting disorder Half-Sister    Breast cancer Neg Hx     Prior to Admission medications   Medication Sig Start Date End Date Taking? Authorizing Provider  albuterol (PROVENTIL HFA;VENTOLIN HFA) 108 (90 Base) MCG/ACT inhaler Inhale 1-2 puffs into the lungs every 6 (six) hours as needed for wheezing or shortness of breath.     [provider]  B Complex-C (B-COMPLEX WITH VITAMIN C) tablet Take 1 tablet by mouth daily.     [provider]  baclofen (LIORESAL) 10 MG tablet TAKE 1 TABLET BY MOUTH THREE TIMES A DAY 12/01/21   Glean Hess, MD  Bioflavonoid Products (ESTER-C) TABS Take 1 tablet by mouth 2 (two) times daily.     [provider]  calcium carbonate (OS-CAL - DOSED IN MG OF ELEMENTAL CALCIUM) 1250 (500 Ca) MG tablet Take 1 tablet by mouth.    [provider]  carvedilol (COREG) 12.5 MG tablet Take 1 tablet (12.5 mg total) by mouth 2 (two) times daily with a meal. 05/10/21    Gollan, Kathlene November, MD  cholecalciferol (VITAMIN D) 25 MCG (1000 UT) tablet Take 1,000 Units by mouth daily.    [provider]  Cranberry 1000 MG CAPS Take 1,000 mg by mouth daily.     [provider]  empagliflozin (JARDIANCE) 10 MG TABS tablet Take 1 tablet (10 mg) by mouth once daily 01/28/22   Minna Merritts, MD  ENTRESTO 24-26 MG TAKE 1 TABLET BY MOUTH TWICE A DAY 11/23/21   Minna Merritts, MD  ezetimibe (ZETIA) 10 MG tablet TAKE 1 TABLET BY MOUTH EVERY DAY 08/25/21   Glean Hess, MD  fluticasone (FLOVENT HFA) 110 MCG/ACT inhaler INHALE 2 PUFFS INTO THE LUNGS TWICE A DAY Patient taking differently: INHALE 1 PUFFS INTO THE LUNGS TWICE A DAY 06/23/21   Flora Lipps, MD  gabapentin (NEURONTIN) 300 MG capsule TAKE 1 CAPSULE BY MOUTH 3 TIMES DAILY. AND TAKE 3 CAPSULES DAILY AT BEDTIME. 06/23/21   Glean Hess, MD  Ginger, Zingiber officinalis, (GINGER PO) Take 1,100 mg by mouth daily.     [provider]  ipratropium (ATROVENT) 0.03 % nasal spray SMARTSIG:1-2 Spray(s) Both Nares 3 Times Daily 03/14/21   [provider]  MAG ASPART-POTASSIUM ASPART PO Take 1 tablet by mouth 2 (two) times daily. 600 mg-198 mg    [provider]  meloxicam (MOBIC) 15 MG tablet TAKE 1 TABLET BY MOUTH EVERY DAY AS NEEDED FOR PAIN 12/03/21   Glean Hess, MD  Misc Natural Products (TART CHERRY ADVANCED PO) Take 1 capsule by mouth daily.     [provider]  montelukast (SINGULAIR) 10 MG tablet Take 10 mg by mouth every evening. 10/25/18   [provider]  Multiple Vitamins-Minerals (MULTIVITAMIN WITH MINERALS) tablet Take 1 tablet by mouth daily. Centrum Silver    [provider]  Olodaterol HCl (STRIVERDI RESPIMAT) 2.5 MCG/ACT AERS Inhale 2 puffs into the lungs daily. USE AT THE SAME TIME EVERYDAY 03/19/21   Flora Lipps, MD  Probiotic Product (PROBIOTIC DAILY PO) Take 1 capsule by mouth 2 (two) times a week.     [provider]   rivaroxaban (XARELTO) 20 MG TABS tablet Take  1 tablet (20 mg total) by mouth as needed (PRN for long car or plane ride). Patient not taking: Reported on 01/28/2022 02/19/21   Minna Merritts, MD  spironolactone (ALDACTONE) 25 MG tablet Take 1 tablet (25 mg total) by mouth daily. 01/28/22   Minna Merritts, MD  Turmeric 500 MG CAPS Take 500 mg by mouth daily.     [provider]  zinc sulfate 220 (50 Zn) MG capsule Take 1 capsule (220 mg total) by mouth daily. 02/02/20   Allie Bossier, MD    Physical Exam: Vitals:   02/12/22 1415 02/12/22 1430 02/12/22 1500 02/12/22 1530  BP: 102/71 (!) 89/67 99/77 (!) 95/59  Pulse: 83 87 71 83  Resp: (!) '21 20 14 16  '$ Temp:      TempSrc:      SpO2: 98% 95% 93% 93%   Physical Exam Vitals and nursing note reviewed.  Constitutional:      Appearance: Normal appearance. She is not ill-appearing.  HENT:     Head: Normocephalic and atraumatic.     Mouth/Throat:     Mouth: Mucous membranes are moist.  Cardiovascular:     Rate and Rhythm: Normal rate and regular rhythm.     Pulses: Normal pulses.     Heart sounds: Normal heart sounds.  Abdominal:     General: Abdomen is flat. There is no distension.     Palpations: Abdomen is soft. There is no mass.     Tenderness: There is no abdominal tenderness.  Musculoskeletal:     Right lower leg: No edema.     Left lower leg: No edema.  Skin:    General: Skin is warm and dry.     Capillary Refill: Capillary refill takes less than 2 seconds.  Neurological:     Mental Status: She is alert.  Psychiatric:        Mood and Affect: Mood normal.        Behavior: Behavior normal.     Data Reviewed:     Latest Ref Rng & Units 02/12/2022    1:20 PM 11/17/2021    2:03 PM 03/31/2021   11:10 AM  CBC  WBC 4.0 - 10.5 K/uL 7.4  6.0  8.2   Hemoglobin 12.0 - 15.0 g/dL 15.1  14.6  15.9   Hematocrit 36.0 - 46.0 % 46.4  45.2  45.8   Platelets 150 - 400 K/uL 200  188  250        Latest Ref Rng & Units  02/12/2022    1:20 PM 11/24/2021   12:21 PM 11/17/2021    2:03 PM  BMP  Glucose 70 - 99 mg/dL 106  97  105   BUN 8 - 23 mg/dL '24  19  20   '$ Creatinine 0.44 - 1.00 mg/dL 1.52  1.11  1.10   Sodium 135 - 145 mmol/L 143  142  142   Potassium 3.5 - 5.1 mmol/L 4.6  4.5  4.5   Chloride 98 - 111 mmol/L 111  113  110   CO2 22 - 32 mmol/L '24  23  26   '$ Calcium 8.9 - 10.3 mg/dL 9.3  9.7  9.6   Lactate 1.4 High-sensitivity troponin 3  Chest x-ray no acute cardiopulmonary disease EKG normal sinus rhythm without ST-T abnormalities Assessment and Plan:  Sritha Adeleine Pask is a 76 y.o. female with medical history significant of heart failure, coronary artery disease, history of PE not on anticoagulation, SVT status post  ablations, reflux, high cholesterol, asthma here for suspected orthostatic hypotension.  Orthostatic hypotension Patient reports a vague episode of getting out of bed this morning and feeling like she was going to fall but does not describe lightheadedness, weakness or any other notable symptoms except for numbness in bilateral cheeks.  She fell backwards without any injuries.  She was given fluids in the ED and reports the numbness is improving.  Her neuro exam is completely normal.  Suspect she might of had orthostatic hypotension in the setting of dehydration as her labs show AKI, hemoconcentration.  Etiology of her dehydration is unclear denies any GI losses or other insensible losses.  Of note she did start Jardiance 2 weeks ago but has not had any issues for the past 2 weeks.  She was given 2 L in the ED and will hold on additional history of heart failure.  Do not suspect arrhythmia at this time given her story, but she has had 2 ablations and so we will keep her on telemetry at this time. -telemetry -echocardiogram  -PT/OT -orthostatic VS -s/p 2 L in ED, hold additional given CHF  AKI Suspect prerenal based on lab work and history.  We will hold additional work-up with renal  ultrasound and urine studies at this time. -Repeat BMP in a.m.  Nonischemic cardiomyopathy, EF 30-35% Nonobstructive coronary disease Hx of PSVT/AVNRT s/p 2x ablation She has a history of nonischemic cardiomyopathy is on carvedilol 12.5 mg twice daily, Entresto, recently added spironolactone.  Cardiology notes patient to start Jardiance at next visit but patient reports she has started to take this she started 2 weeks ago.  She has no evidence of decompensated heart failure at this time.  We will continue coreg and hold entresto and spironolactone. We will restart these medications before discharge BP permitting. Would consider D/C SGLT2i of her GDMT if her BP does not have sufficient room and continue all other medications. We will obtain TTE given temporary scale back of medications. Not on a standing diuretic.  -hold entresto -hold spiro -continue coreg 12.5 mg BID with hold parameters -continue spiro 25 mg with hold parameters -hold SGLT2i -echocardiogram  Chronic Pain -continue gabapentin 300 mg QHS -holding meloxicam for AKI -Continue baclofen 10 mg twice daily  Hx of PE not on AC History of bilateral PE in the past (2021) setting of COVID now only takes Xarelto as needed for long trips. -Holding Xarelto  Asthma Follows with pulmonology as an outpatient. -Continue Singulair -continue with olodaterol 2 puffs daily -Continue Flovent 2 puffs twice daily -continue ipratropium 0.03 BID  HLD -Continue ezetimibe 10 mg -Not on statin   GERD -continue calcium carbonate  Advance Care Planning:   Code Status: Prior Full MDPOA: Sons Micah and Levi DVT ppx: heparin  Diet: Regular  Consults: None  Family Communication: D/w son at bedside  Severity of Illness: The appropriate patient status for this patient is OBSERVATION. Observation status is judged to be reasonable and necessary in order to provide the required intensity of service to ensure the patient's safety. The patient's  presenting symptoms, physical exam findings, and initial radiographic and laboratory data in the context of their medical condition is felt to place them at decreased risk for further clinical deterioration. Furthermore, it is anticipated that the patient will be medically stable for discharge from the hospital within 2 midnights of admission.   Author: Lorelei Pont, MD 02/12/2022 3:52 PM  For on call review [email or web address].

## 2022-02-13 DIAGNOSIS — E861 Hypovolemia: Secondary | ICD-10-CM | POA: Diagnosis not present

## 2022-02-13 DIAGNOSIS — I9589 Other hypotension: Secondary | ICD-10-CM | POA: Diagnosis not present

## 2022-02-13 DIAGNOSIS — I951 Orthostatic hypotension: Secondary | ICD-10-CM | POA: Diagnosis not present

## 2022-02-13 LAB — BASIC METABOLIC PANEL
Anion gap: 5 (ref 5–15)
BUN: 24 mg/dL — ABNORMAL HIGH (ref 8–23)
CO2: 25 mmol/L (ref 22–32)
Calcium: 9.1 mg/dL (ref 8.9–10.3)
Chloride: 115 mmol/L — ABNORMAL HIGH (ref 98–111)
Creatinine, Ser: 1.3 mg/dL — ABNORMAL HIGH (ref 0.44–1.00)
GFR, Estimated: 43 mL/min — ABNORMAL LOW (ref >60)
Glucose, Bld: 98 mg/dL (ref 70–99)
Potassium: 5.2 mmol/L — ABNORMAL HIGH (ref 3.5–5.1)
Sodium: 145 mmol/L (ref 135–145)

## 2022-02-13 LAB — POTASSIUM: Potassium: 4.1 mmol/L (ref 3.5–5.1)

## 2022-02-13 LAB — CBC
HCT: 43 % (ref 36.0–46.0)
Hemoglobin: 13.8 g/dL (ref 12.0–15.0)
MCH: 30.1 pg (ref 26.0–34.0)
MCHC: 32.1 g/dL (ref 30.0–36.0)
MCV: 93.9 fL (ref 80.0–100.0)
Platelets: 161 10*3/uL (ref 150–400)
RBC: 4.58 MIL/uL (ref 3.87–5.11)
RDW: 11.9 % (ref 11.5–15.5)
WBC: 5.6 10*3/uL (ref 4.0–10.5)
nRBC: 0 % (ref 0.0–0.2)

## 2022-02-13 LAB — MAGNESIUM: Magnesium: 2.4 mg/dL (ref 1.7–2.4)

## 2022-02-13 MED ORDER — SPIRONOLACTONE 25 MG PO TABS: ORAL_TABLET | ORAL | 3 refills | Status: DC

## 2022-02-13 MED ORDER — FLUTICASONE PROPIONATE HFA 110 MCG/ACT IN AERO: INHALATION_SPRAY | RESPIRATORY_TRACT | 2 refills | Status: DC

## 2022-02-13 MED ORDER — EMPAGLIFLOZIN 10 MG PO TABS: ORAL_TABLET | ORAL | 1 refills | Status: DC

## 2022-02-13 MED ORDER — ENTRESTO 24-26 MG PO TABS: ORAL_TABLET | ORAL | 0 refills | Status: DC

## 2022-02-13 MED ORDER — BACLOFEN 10 MG PO TABS: 10.0000 mg | ORAL_TABLET | Freq: Two times a day (BID) | ORAL | 0 refills | Status: DC

## 2022-02-13 MED ORDER — GABAPENTIN 300 MG PO CAPS: 300.0000 mg | ORAL_CAPSULE | Freq: Two times a day (BID) | ORAL | 3 refills | Status: DC

## 2022-02-13 NOTE — Progress Notes (Signed)
Made Dr. Billie Ruddy aware potassium this am is 5.2

## 2022-02-13 NOTE — Discharge Summary (Signed)
Physician Discharge Summary   Audrey Martinez  female DOB: [birth year]  KZS:[id number]  PCP: Glean Hess, MD  Admit date: 02/12/2022 Discharge date: 02/13/2022  Admitted From: home Disposition:  home CODE STATUS: Full code  Discharge Instructions     Discharge instructions   Complete by: As directed    Your medication may have caused you to be dehydrated, which led to low blood pressure, mild acute kidney injury, dizziness and almost passing out.    Please hold your Entresto, Spironolactone, and Jardiance until follow up with your cardiologist in about 1 week.   Dr. Enzo Bi [facility] Course:  For full details, please see H&P, progress notes, consult notes and ancillary notes.  Briefly,  Audrey Martinez is a 76 y.o. female with medical history significant of heart failure, coronary artery disease, history of PE not on anticoagulation, SVT status post ablations, asthma here for presyncope.   As pt was getting up out of bed, she stood up and felt off and fell backward into her bed.  She did not sustain any injuries and did not lose consciousness.  She did not describe lightheadedness, weakness in her legs but felt like she could not continue standing up so fell backwards.   She normally has a low blood pressure.  She reports addition of Jardiance 2 weeks ago but otherwise no other medication changes.   Presyncope 2/2 Orthostatic hypotension --BP 87/50.  Suspect pt had orthostatic hypotension from dehydration due to addition of Jardiance while taking all her other BP meds (Entresto, Spironolactone).  BP improved after IVF.   --Hold home Entresto, Spironolactone, and Jardiance until follow up with cardiologist in about 1 week.  AKI Cr 1.52 on presentation, baseline Cr around 1.1.  Cr improved to 1.3 next day after IVF.   --Hold home Entresto, Spironolactone until follow up with cardiologist in about 1 week.  Nonischemic cardiomyopathy, EF  30-35% Nonobstructive coronary disease Hx of PSVT/AVNRT s/p 2x ablation --cont home coreg --Hold home Entresto, Spironolactone until follow up with cardiologist in about 1 week.  Chronic Pain -continue gabapentin 300 mg QHS -Continue baclofen 10 mg twice daily   Hx of PE not on AC History of bilateral PE in the past (2021) setting of COVID now only takes Xarelto as needed for long trips.   Asthma Follows with pulmonology as an outpatient. --cont home bronchodilator regimen as below   HLD -Continue ezetimibe 10 mg   GERD --cont Pepcid   Unless noted above, medications under "STOP" list are ones pt was not taking PTA.  Discharge Diagnoses:  Principal Problem:   Hypotension     Discharge Instructions:  Allergies as of 02/13/2022       Reactions   Amoxicillin Hives, Shortness Of Breath, Swelling   Did it involve swelling of the face/tongue/throat, SOB, or low BP? Yes Did it involve sudden or severe rash/hives, skin peeling, or any reaction on the inside of your mouth or nose? Yes Did you need to seek medical attention at a hospital or doctor's office? Yes When did it last happen? 1998      If all above answers are "NO", may proceed with cephalosporin use.   Aspirin Anaphylaxis, Shortness Of Breath   Bacitracin-polymyx-neo-hc [bacitra-neomycin-polymyxin-hc] Rash   Ciprofloxacin Rash   Ciprofloxacin-dexamethasone Anaphylaxis   Crestor [rosuvastatin] Other (See Comments)   Caused problems with memory.   Erythromycin Hives, Rash   Hydrochlorothiazide Other (See Comments), Palpitations   Raises  her blood pressure.    Ibuprofen Other (See Comments)   Sharp stomach pains   Morphine And Related Nausea And Vomiting   Morpholine Salicylate Nausea Only   Omeprazole Palpitations, Other (See Comments)   Tachycardia and same sensation as her SVTs per patient   Doxycycline Hyclate Hives   Keflex [cephalexin] Hives   Lovastatin Other (See Comments)   "Unable to function"    Simvastatin Other (See Comments)   "Unable to function"   Neomycin-bacitracin Zn-polymyx Hives, Rash   Tape Hives, Rash        Medication List     STOP taking these medications    MAG ASPART-POTASSIUM ASPART PO       TAKE these medications    albuterol 108 (90 Base) MCG/ACT inhaler Commonly known as: VENTOLIN HFA Inhale 1-2 puffs into the lungs every 6 (six) hours as needed for wheezing or shortness of breath.   B-complex with vitamin C tablet Take 1 tablet by mouth daily.   baclofen 10 MG tablet Commonly known as: LIORESAL Take 1 tablet (10 mg total) by mouth 2 (two) times daily. Home med. What changed:  when to take this additional instructions   calcium carbonate 1250 (500 Ca) MG tablet Commonly known as: OS-CAL - dosed in mg of elemental calcium Take 1 tablet by mouth.   carvedilol 12.5 MG tablet Commonly known as: COREG Take 1 tablet (12.5 mg total) by mouth 2 (two) times daily with a meal.   cetirizine 10 MG tablet Commonly known as: ZYRTEC Take 10 mg by mouth daily.   cholecalciferol 25 MCG (1000 UNIT) tablet Commonly known as: VITAMIN D3 Take 1,000 Units by mouth daily.   Cranberry 1000 MG Caps Take 1,000 mg by mouth daily.   empagliflozin 10 MG Tabs tablet Commonly known as: Jardiance Hold until followup with cardiology due to hypotension, dehydration and acute kidney injury. What changed: additional instructions   Entresto 24-26 MG Generic drug: sacubitril-valsartan Hold until followup with cardiology due to hypotension, dehydration and acute kidney injury. What changed:  how much to take how to take this when to take this additional instructions   Ester-C Tabs Take 1 tablet by mouth 2 (two) times daily.   ezetimibe 10 MG tablet Commonly known as: ZETIA TAKE 1 TABLET BY MOUTH EVERY DAY   famotidine 20 MG tablet Commonly known as: PEPCID Take 20 mg by mouth 2 (two) times daily.   fluticasone 110 MCG/ACT inhaler Commonly known as:  Flovent HFA INHALE 1 PUFFS INTO THE LUNGS TWICE A DAY.  Home med. What changed: additional instructions   gabapentin 300 MG capsule Commonly known as: NEURONTIN Take 1 capsule (300 mg total) by mouth 2 (two) times daily. Home med. What changed: See the new instructions.   GINGER PO Take 1,100 mg by mouth daily.   ipratropium 0.03 % nasal spray Commonly known as: ATROVENT SMARTSIG:1-2 Spray(s) Both Nares 3 Times Daily   meloxicam 15 MG tablet Commonly known as: MOBIC TAKE 1 TABLET BY MOUTH EVERY DAY AS NEEDED FOR PAIN   montelukast 10 MG tablet Commonly known as: SINGULAIR Take 10 mg by mouth every evening.   multivitamin with minerals tablet Take 1 tablet by mouth daily. Centrum Silver   PROBIOTIC DAILY PO Take 1 capsule by mouth 2 (two) times a week.   rivaroxaban 20 MG Tabs tablet Commonly known as: XARELTO Take 1 tablet (20 mg total) by mouth as needed (PRN for long car or plane ride).   spironolactone 25 MG  tablet Commonly known as: ALDACTONE Hold until followup with cardiology due to hypotension, dehydration and acute kidney injury. What changed:  how much to take how to take this when to take this additional instructions   Striverdi Respimat 2.5 MCG/ACT Aers Generic drug: Olodaterol HCl Inhale 2 puffs into the lungs daily. USE AT THE SAME TIME EVERYDAY   TART CHERRY ADVANCED PO Take 1 capsule by mouth daily.   Turmeric 500 MG Caps Take 500 mg by mouth daily.   zinc sulfate 220 (50 Zn) MG capsule Take 1 capsule (220 mg total) by mouth daily.         Follow-up Information     Minna Merritts, MD Follow up in 1 week(s).   Specialty: Cardiology Contact information: [address]   [id number] [phone]                 Allergies  Allergen Reactions   Amoxicillin Hives, Shortness Of Breath and Swelling    Did it involve swelling of the face/tongue/throat, SOB, or low BP? Yes Did it involve sudden or severe  rash/hives, skin peeling, or any reaction on the inside of your mouth or nose? Yes Did you need to seek medical attention at a hospital or doctor's office? Yes When did it last happen? 1998      If all above answers are "NO", may proceed with cephalosporin use.    Aspirin Anaphylaxis and Shortness Of Breath   Bacitracin-Polymyx-Neo-Hc [Bacitra-Neomycin-Polymyxin-Hc] Rash   Ciprofloxacin Rash   Ciprofloxacin-Dexamethasone Anaphylaxis   Crestor [Rosuvastatin] Other (See Comments)    Caused problems with memory.   Erythromycin Hives and Rash   Hydrochlorothiazide Other (See Comments) and Palpitations    Raises her blood pressure.    Ibuprofen Other (See Comments)    Sharp stomach pains   Morphine And Related Nausea And Vomiting   Morpholine Salicylate Nausea Only   Omeprazole Palpitations and Other (See Comments)    Tachycardia and same sensation as her SVTs per patient   Doxycycline Hyclate Hives   Keflex [Cephalexin] Hives   Lovastatin Other (See Comments)    "Unable to function"   Simvastatin Other (See Comments)    "Unable to function"   Neomycin-Bacitracin Zn-Polymyx Hives and Rash   Tape Hives and Rash     The results of significant diagnostics from this hospitalization (including imaging, microbiology, ancillary and laboratory) are listed below for reference.   Consultations:   Procedures/Studies: DG Chest Portable 1 View  Result Date: 02/12/2022 CLINICAL DATA:  Weakness. Numbness to the lip in jaw. EXAM: PORTABLE CHEST 1 VIEW COMPARISON:  Two-view chest x-ray 03/31/2021 FINDINGS: Heart size is normal. Lungs are clear. No edema or effusion is present. Right shoulder hemiarthroplasty noted. Advanced degenerative changes are present at the left shoulder. IMPRESSION: No acute cardiopulmonary disease. Electronically Signed   By: San Morelle M.D.   On: 02/12/2022 16:04      Labs: BNP (last 3 results) No results for input(s): "BNP" in the last 8760 hours. Basic  Metabolic Panel: Recent Labs  Lab 02/12/22 1320 02/12/22 1835 02/13/22 0541 02/13/22 0918  NA 143  --  145  --   K 4.6  --  5.2* 4.1  CL 111  --  115*  --   CO2 24  --  25  --   GLUCOSE 106*  --  98  --   BUN 24*  --  24*  --   CREATININE 1.52* 1.27* 1.30*  --  CALCIUM 9.3  --  9.1  --   MG  --   --  2.4  --    Liver Function Tests: Recent Labs  Lab 02/12/22 1320  AST 23  ALT 16  ALKPHOS 49  BILITOT 1.2  PROT 6.3*  ALBUMIN 3.9   No results for input(s): "LIPASE", "AMYLASE" in the last 168 hours. No results for input(s): "AMMONIA" in the last 168 hours. CBC: Recent Labs  Lab 02/12/22 1320 02/12/22 1946 02/13/22 0541  WBC 7.4 6.4 5.6  NEUTROABS 4.8  --   --   HGB 15.1* 15.3* 13.8  HCT 46.4* 45.4 43.0  MCV 93.5 91.0 93.9  PLT 200 171 161   Cardiac Enzymes: No results for input(s): "CKTOTAL", "CKMB", "CKMBINDEX", "TROPONINI" in the last 168 hours. BNP: Invalid input(s): "POCBNP" CBG: No results for input(s): "GLUCAP" in the last 168 hours. D-Dimer No results for input(s): "DDIMER" in the last 72 hours. Hgb A1c No results for input(s): "HGBA1C" in the last 72 hours. Lipid Profile No results for input(s): "CHOL", "HDL", "LDLCALC", "TRIG", "CHOLHDL", "LDLDIRECT" in the last 72 hours. Thyroid function studies No results for input(s): "TSH", "T4TOTAL", "T3FREE", "THYROIDAB" in the last 72 hours.  Invalid input(s): "FREET3" Anemia work up No results for input(s): "VITAMINB12", "FOLATE", "FERRITIN", "TIBC", "IRON", "RETICCTPCT" in the last 72 hours. Urinalysis    Component Value Date/Time   BILIRUBINUR neg 11/25/2019 1115   PROTEINUR Negative 11/25/2019 1115   UROBILINOGEN 0.2 11/25/2019 1115   NITRITE neg 11/25/2019 1115   LEUKOCYTESUR Moderate (2+) (A) 11/25/2019 1115   Sepsis Labs Recent Labs  Lab 02/12/22 1320 02/12/22 1946 02/13/22 0541  WBC 7.4 6.4 5.6   Microbiology Recent Results (from the past 240 hour(s))  SARS Coronavirus 2 by RT PCR  (hospital order, performed in [facility] hospital lab) *cepheid single result test* Anterior Nasal Swab     Status: None   Collection Time: 02/12/22  3:22 PM   Specimen: Anterior Nasal Swab  Result Value Ref Range Status   SARS Coronavirus 2 by RT PCR NEGATIVE NEGATIVE Final    Comment: (NOTE) SARS-CoV-2 target nucleic acids are NOT DETECTED.  The SARS-CoV-2 RNA is generally detectable in upper and lower respiratory specimens during the acute phase of infection. The lowest concentration of SARS-CoV-2 viral copies this assay can detect is 250 copies / mL. A negative result does not preclude SARS-CoV-2 infection and should not be used as the sole basis for treatment or other patient management decisions.  A negative result may occur with improper specimen collection / handling, submission of specimen other than nasopharyngeal swab, presence of viral mutation(s) within the areas targeted by this assay, and inadequate number of viral copies (<250 copies / mL). A negative result must be combined with clinical observations, patient history, and epidemiological information.  Fact Sheet for Patients:   https://www.patel.info/  Fact Sheet for Healthcare Providers: https://hall.com/  This test is not yet approved or  cleared by the [place] FDA and has been authorized for detection and/or diagnosis of SARS-CoV-2 by FDA under an Emergency Use Authorization (EUA).  This EUA will remain in effect (meaning this test can be used) for the duration of the COVID-19 declaration under Section 564(b)(1) of the Act, 21 U.S.C. section 360bbb-3(b)(1), unless the authorization is terminated or revoked sooner.  Performed at [facility], [address] [place], [place] [address]      Total time spend on discharging this patient, including the last patient exam, discussing the hospital  stay, instructions for ongoing care as it relates to all  pertinent caregivers, as well as preparing the medical discharge records, prescriptions, and/or referrals as applicable, is 40 minutes.    Enzo Bi, MD  [facility] 02/13/2022, 10:32 AM

## 2022-02-13 NOTE — Progress Notes (Signed)
Elisha Renda Rolls to be D/C'd Home per MD order.  Discussed prescriptions and follow up appointments with the patient. No Prescriptions given to patient, medication list explained in detail. Pt verbalized understanding.  Allergies as of 02/13/2022       Reactions   Amoxicillin Hives, Shortness Of Breath, Swelling   Did it involve swelling of the face/tongue/throat, SOB, or low BP? Yes Did it involve sudden or severe rash/hives, skin peeling, or any reaction on the inside of your mouth or nose? Yes Did you need to seek medical attention at a hospital or doctor's office? Yes When did it last happen? 1998      If all above answers are "NO", may proceed with cephalosporin use.   Aspirin Anaphylaxis, Shortness Of Breath   Bacitracin-polymyx-neo-hc [bacitra-neomycin-polymyxin-hc] Rash   Ciprofloxacin Rash   Ciprofloxacin-dexamethasone Anaphylaxis   Crestor [rosuvastatin] Other (See Comments)   Caused problems with memory.   Erythromycin Hives, Rash   Hydrochlorothiazide Other (See Comments), Palpitations   Raises her blood pressure.    Ibuprofen Other (See Comments)   Sharp stomach pains   Morphine And Related Nausea And Vomiting   Morpholine Salicylate Nausea Only   Omeprazole Palpitations, Other (See Comments)   Tachycardia and same sensation as her SVTs per patient   Doxycycline Hyclate Hives   Keflex [cephalexin] Hives   Lovastatin Other (See Comments)   "Unable to function"   Simvastatin Other (See Comments)   "Unable to function"   Neomycin-bacitracin Zn-polymyx Hives, Rash   Tape Hives, Rash        Medication List     STOP taking these medications    MAG ASPART-POTASSIUM ASPART PO       TAKE these medications    albuterol 108 (90 Base) MCG/ACT inhaler Commonly known as: VENTOLIN HFA Inhale 1-2 puffs into the lungs every 6 (six) hours as needed for wheezing or shortness of breath.   B-complex with vitamin C tablet Take 1 tablet by mouth daily.   baclofen 10  MG tablet Commonly known as: LIORESAL Take 1 tablet (10 mg total) by mouth 2 (two) times daily. Home med. What changed:  when to take this additional instructions   calcium carbonate 1250 (500 Ca) MG tablet Commonly known as: OS-CAL - dosed in mg of elemental calcium Take 1 tablet by mouth.   carvedilol 12.5 MG tablet Commonly known as: COREG Take 1 tablet (12.5 mg total) by mouth 2 (two) times daily with a meal.   cetirizine 10 MG tablet Commonly known as: ZYRTEC Take 10 mg by mouth daily.   cholecalciferol 25 MCG (1000 UNIT) tablet Commonly known as: VITAMIN D3 Take 1,000 Units by mouth daily.   Cranberry 1000 MG Caps Take 1,000 mg by mouth daily.   empagliflozin 10 MG Tabs tablet Commonly known as: Jardiance Hold until followup with cardiology due to hypotension, dehydration and acute kidney injury. What changed: additional instructions   Entresto 24-26 MG Generic drug: sacubitril-valsartan Hold until followup with cardiology due to hypotension, dehydration and acute kidney injury. What changed:  how much to take how to take this when to take this additional instructions   Ester-C Tabs Take 1 tablet by mouth 2 (two) times daily.   ezetimibe 10 MG tablet Commonly known as: ZETIA TAKE 1 TABLET BY MOUTH EVERY DAY   famotidine 20 MG tablet Commonly known as: PEPCID Take 20 mg by mouth 2 (two) times daily.   fluticasone 110 MCG/ACT inhaler Commonly known as: Flovent HFA INHALE 1 PUFFS  INTO THE LUNGS TWICE A DAY.  Home med. What changed: additional instructions   gabapentin 300 MG capsule Commonly known as: NEURONTIN Take 1 capsule (300 mg total) by mouth 2 (two) times daily. Home med. What changed: See the new instructions.   GINGER PO Take 1,100 mg by mouth daily.   ipratropium 0.03 % nasal spray Commonly known as: ATROVENT SMARTSIG:1-2 Spray(s) Both Nares 3 Times Daily   meloxicam 15 MG tablet Commonly known as: MOBIC TAKE 1 TABLET BY MOUTH EVERY  DAY AS NEEDED FOR PAIN   montelukast 10 MG tablet Commonly known as: SINGULAIR Take 10 mg by mouth every evening.   multivitamin with minerals tablet Take 1 tablet by mouth daily. Centrum Silver   PROBIOTIC DAILY PO Take 1 capsule by mouth 2 (two) times a week.   rivaroxaban 20 MG Tabs tablet Commonly known as: XARELTO Take 1 tablet (20 mg total) by mouth as needed (PRN for long car or plane ride).   spironolactone 25 MG tablet Commonly known as: ALDACTONE Hold until followup with cardiology due to hypotension, dehydration and acute kidney injury. What changed:  how much to take how to take this when to take this additional instructions   Striverdi Respimat 2.5 MCG/ACT Aers Generic drug: Olodaterol HCl Inhale 2 puffs into the lungs daily. USE AT THE SAME TIME EVERYDAY   TART CHERRY ADVANCED PO Take 1 capsule by mouth daily.   Turmeric 500 MG Caps Take 500 mg by mouth daily.   zinc sulfate 220 (50 Zn) MG capsule Take 1 capsule (220 mg total) by mouth daily.        Vitals:   02/13/22 0802 02/13/22 0831  BP: 125/69   Pulse: 80   Resp: 16   Temp: 97.7 F (36.5 C)   SpO2: 97% 96%    Skin clean, dry and intact without evidence of skin break down, no evidence of skin tears noted. IV catheter discontinued intact. Site without signs and symptoms of complications. Dressing and pressure applied. Pt denies pain at this time. No complaints noted.  An After Visit Summary was printed and given to the patient. Patient escorted via [place], and D/C home via private auto.  Coin

## 2022-02-13 NOTE — Evaluation (Signed)
Physical Therapy Evaluation Patient Details Name: Sheniya Garciaperez MRN: [id number] DOB: [birth year] Today's Date: 02/13/2022  History of Present Illness  Geraldin Paulena Servais is a 76 y.o. female with medical history significant of heart failure, coronary artery disease, history of PE not on anticoagulation, SVT status post ablations, reflux, high cholesterol, asthma here for suspected orthostatic hypotension.  Clinical Impression  Pt alert and oriented x 3 at start of session. Pt reports her dizziness has improved significantly since admission and her nurse reports orthostatics have improved as well. Pt previously independent with all mobility prior to hospitalization but does have all necessary equipment in her home in needed. Pt able to perform bed mobility and transfers with modified independence (for set up), but did not display and safety concerns, s/s of imbalance or dizziness during evaluation. Pt has sone who lives nearby her home who is able to help as needed and she has no STE her home. PT recommendation if for discharge to home with PRN assistance  based on pt current functional status. Pt wil benefit form skilled PT in the hospital setting in order to prevent onset of functional decime due to immobility and to ensure safety with prolonged ambulatory tasks when she has to access her community following discharge. Pt left in bedside chair with breakfast and all needs met.      Recommendations for follow up therapy are one component of a multi-disciplinary discharge planning process, led by the attending physician.  Recommendations may be updated based on patient status, additional functional criteria and insurance authorization.  Follow Up Recommendations No PT follow up      Assistance Recommended at Discharge PRN  Patient can return home with the following  Other (comment) (help as needed getting settled in at home.)    Equipment Recommendations    Recommendations for Other  Services       Functional Status Assessment Patient has had a recent decline in their functional status and demonstrates the ability to make significant improvements in function in a reasonable and predictable amount of time.     Precautions / Restrictions Precautions Precautions: Fall Restrictions Weight Bearing Restrictions: No      Mobility  Bed Mobility Overal bed mobility: Modified Independent             General bed mobility comments: completed supine to sit with Mod I    Transfers Overall transfer level: Needs assistance   Transfers: Sit to/from Stand, Bed to chair/wheelchair/BSC Sit to Stand: Supervision   Step pivot transfers: Supervision            Ambulation/Gait Ambulation/Gait assistance: Modified independent (Device/Increase time) Gait Distance (Feet): 3 Feet Assistive device: Rolling walker (2 wheels)         General Gait Details: ambulates a few feet form bed to bedside chair for breakfast  Stairs            Wheelchair Mobility    Modified Rankin (Stroke Patients Only)       Balance Overall balance assessment: No apparent balance deficits (not formally assessed)                                           Pertinent Vitals/Pain  N/A    Home Living Family/patient expects to be discharged to:: Private residence Living Arrangements: Alone Available Help at Discharge: Family;Friend(s);Available PRN/intermittently Type of Home: House Home Access: Stairs to  enter Entrance Stairs-Rails: None Entrance Stairs-Number of Steps: 1 threshold step   Home Layout: One level Home Equipment: Conservation officer, nature (2 wheels);Rollator (4 wheels);Cane - single point;BSC/3in1 Additional Comments: Has 2 sons, 1 nearby that can help PRN.    Prior Function Prior Level of Function : Independent/Modified Independent;Driving             Mobility Comments: Pt reports IND in functional mobility for short household distance without an  AD, occasionally uses SPC for community distances. One fall in the past 6 months. ADLs Comments: Pt reports IND with ADLs, IADLs. Drives, manages medication, grocery shops.     Hand Dominance    R    Extremity/Trunk Assessment   Upper Extremity Assessment Upper Extremity Assessment: Overall WFL for tasks assessed    Lower Extremity Assessment Lower Extremity Assessment: Generalized weakness (R UE weakness secondary to previous CVA)    Cervical / Trunk Assessment Cervical / Trunk Assessment: Normal  Communication   Communication: No difficulties  Cognition Arousal/Alertness: Awake/alert Behavior During Therapy: WFL for tasks assessed/performed Overall Cognitive Status: Within Functional Limits for tasks assessed                                 General Comments: A&Ox4        General Comments General comments (skin integrity, edema, etc.): Pt denied numbness/tingling in hands or feet, no c/o dizziness with standing    Exercises     Assessment/Plan  Pt alert and oriented x 3 at start of session. Pt reports her dizziness has improved significantly since admission and her nurse reports orthostatics have improved as well. Pt previously independent with all mobility prior to hospitalization but does have all necessary equipment in her home in needed. Pt able to perform bed mobility and transfers with modified independence (for set up), but did not display and safety concerns, s/s of imbalance or dizziness during evaluation. Pt has sone who lives nearby her home who is able to help as needed and she has no STE her home. PT recommendation if for discharge to home with PRN assistance  based on pt current functional status. Pt wil benefit form skilled PT in the hospital setting in order to prevent onset of functional decime due to immobility and to ensure safety with prolonged ambulatory tasks when she has to access her community following discharge. Pt left in bedside chair  with breakfast and all needs met.     PT Assessment Patient needs continued PT services  PT Problem List Decreased strength;Decreased activity tolerance;Decreased mobility       PT Treatment Interventions Gait training;Stair training;Functional mobility training;Therapeutic activities;Therapeutic exercise;Balance training;Neuromuscular re-education;Patient/family education    PT Goals (Current goals can be found in the Care Plan section)  Acute Rehab PT Goals Patient Stated Goal: Return home independently PT Goal Formulation: With patient Time For Goal Achievement: 02/27/22 Potential to Achieve Goals: Good    Frequency Min 2X/week     Co-evaluation    OT - 02/13/2022            AM-PAC PT "6 Clicks" Mobility  Outcome Measure Help needed turning from your back to your side while in a flat bed without using bedrails?: None Help needed moving from lying on your back to sitting on the side of a flat bed without using bedrails?: None Help needed moving to and from a bed to a chair (including a wheelchair)?: None Help needed standing  up from a chair using your arms (e.g., wheelchair or bedside chair)?: None Help needed to walk in hospital room?: None Help needed climbing 3-5 steps with a railing? : A Lot 6 Click Score: 22    End of Session Equipment Utilized During Treatment: Gait belt Activity Tolerance: Patient tolerated treatment well Patient left: in chair;with call bell/phone within reach   PT Visit Diagnosis: Unsteadiness on feet (R26.81);Other abnormalities of gait and mobility (R26.89);Muscle weakness (generalized) (M62.81);Difficulty in walking, not elsewhere classified (R26.2)    Time: 3299-2426 PT Time Calculation (min) (ACUTE ONLY): 13 min   Charges:   PT Evaluation $PT Eval Low Complexity: 1 Low          Rivka Barbara PT, DPT    Particia Lather 02/13/2022, 9:58 AM

## 2022-02-13 NOTE — Evaluation (Addendum)
Occupational Therapy Evaluation Patient Details Name: Audrey Martinez MRN: [id number] DOB: [birth year] Today's Date: 02/13/2022   History of Present Illness Audrey Martinez is a 76 y.o. female with medical history significant of heart failure, coronary artery disease, history of PE not on anticoagulation, SVT status post ablations, reflux, high cholesterol, asthma here for suspected orthostatic hypotension.   Clinical Impression   Patient agreeable to OT/PT co-evaluation to maximize safety and participation. Patient presenting with decreased endurance and BLE weakness impacting safety and independence in ADLs. At baseline, patient is independent in ADLs, IADLs, and functional mobility. Patient currently functioning at Mod I for bed mobility, set up-supervision for ADLs, and supervision for functional transfers without an AD. Patient is close to baseline level of function with ADLs, however, OT will continue to follow pt while in hospital to prevent further decline and work on implementing energy conservation techniques during self-care tasks. Patient will benefit from acute OT to increase overall independence in the areas of ADLs and functional mobility in order to safely discharge home.   Recommendations for follow up therapy are one component of a multi-disciplinary discharge planning process, led by the attending physician.  Recommendations may be updated based on patient status, additional functional criteria and insurance authorization.   Follow Up Recommendations  No OT follow up    Assistance Recommended at Discharge PRN  Patient can return home with the following Assistance with cooking/housework;Help with stairs or ramp for entrance   Functional Status Assessment  Patient has had a recent decline in their functional status and demonstrates the ability to make significant improvements in function in a reasonable and predictable amount of time.  Equipment Recommendations  None  recommended by OT    Recommendations for Other Services       Precautions / Restrictions Precautions Precautions: Fall Restrictions Weight Bearing Restrictions: No      Mobility Bed Mobility Overal bed mobility: Modified Independent             General bed mobility comments: completed supine to sit with Mod I    Transfers Overall transfer level: Needs assistance   Transfers: Sit to/from Stand, Bed to chair/wheelchair/BSC Sit to Stand: Supervision     Step pivot transfers: Supervision            Balance Overall balance assessment: No apparent balance deficits (not formally assessed)                                         ADL either performed or assessed with clinical judgement   ADL Overall ADL's : Needs assistance/impaired Eating/Feeding: Set up                   Lower Body Dressing: Supervision/safety;Sitting/lateral leans   Toilet Transfer: Copy Details (indicate cue type and reason): simulated with t/f from EOB > recliner, no AD         Functional mobility during ADLs: Supervision/safety (to take steps toward recliner, no AD) General ADL comments: Anticipate pt returning to IND/Mod I with ADLs quickly       Vision Baseline Vision/History: 1 Wears glasses Patient Visual Report: No change from baseline       Perception     Praxis      Pertinent Vitals/Pain Pain Assessment Pain Assessment: No/denies pain     Hand Dominance     Extremity/Trunk Assessment Upper Extremity Assessment  Upper Extremity Assessment: Overall WFL for tasks assessed   Lower Extremity Assessment Lower Extremity Assessment: Generalized weakness   Cervical / Trunk Assessment Cervical / Trunk Assessment: Normal   Communication Communication Communication: No difficulties   Cognition Arousal/Alertness: Awake/alert Behavior During Therapy: WFL for tasks assessed/performed Overall Cognitive Status: Within  Functional Limits for tasks assessed                                 General Comments: A&Ox4     General Comments  Pt denied numbness/tingling in hands or feet, no c/o dizziness    Exercises Other Exercises Other Exercises: OT provided education re: role of OT, OT POC, post acute recs, sitting up for all meals, EOB/OOB mobility with assistance, home/fall safety.     Shoulder Instructions      Home Living Family/patient expects to be discharged to:: Private residence Living Arrangements: Alone Available Help at Discharge: Family;Friend(s);Available PRN/intermittently Type of Home: House Home Access: Stairs to enter [employer] of Steps: 1 threshold step Entrance Stairs-Rails: None Home Layout: One level     Bathroom Shower/Tub: Occupational psychologist: Standard     Home Equipment: Conservation officer, nature (2 wheels);Rollator (4 wheels);Cane - single point;BSC/3in1   Additional Comments: Has 2 sons, 1 nearby that can help PRN.      Prior Functioning/Environment Prior Level of Function : Independent/Modified Independent;Driving             Mobility Comments: Pt reports IND in functional mobility for short household distance without an AD, occasionally uses SPC for community distances. One fall in the past 6 months. ADLs Comments: Pt reports IND with ADLs, IADLs. Drives, manages medication, grocery shops.        OT Problem List: Decreased knowledge of use of DME or AE;Decreased activity tolerance;Decreased strength      OT Treatment/Interventions: Self-care/ADL training;Therapeutic exercise;Patient/family education;Energy conservation;DME and/or AE instruction;Therapeutic activities;Balance training    OT Goals(Current goals can be found in the care plan section) Acute Rehab OT Goals Patient Stated Goal: to go home OT Goal Formulation: With patient Time For Goal Achievement: 02/27/22 ADL Goals Pt Will Perform Grooming:  Independently;standing Pt Will Perform Upper Body Dressing: Independently;sitting Pt Will Perform Lower Body Dressing: Independently;sitting/lateral leans;sit to/from stand Pt Will Transfer to Toilet: Independently;ambulating;regular height toilet Pt Will Perform Toileting - Clothing Manipulation and hygiene: Independently;sit to/from stand Additional ADL Goal #1: Pt will verbalize 2-3 Energy conservation techniques with Min verbal cues for improved activity tolerance with ADL tasks  OT Frequency: Min 2X/week    Co-evaluation              AM-PAC OT "6 Clicks" Daily Activity     Outcome Measure Help from another person eating meals?: None Help from another person taking care of personal grooming?: A Little Help from another person toileting, which includes using toliet, bedpan, or urinal?: A Little Help from another person bathing (including washing, rinsing, drying)?: A Little Help from another person to put on and taking off regular upper body clothing?: None Help from another person to put on and taking off regular lower body clothing?: A Little 6 Click Score: 20   End of Session Nurse Communication: Mobility status  Activity Tolerance: Patient tolerated treatment well Patient left: in chair;with call bell/phone within reach  OT Visit Diagnosis: Unsteadiness on feet (R26.81);History of falling (Z91.81)  Time: 4580-9983 OT Time Calculation (min): 13 min Charges:  OT General Charges $OT Visit: 1 Visit OT Evaluation $OT Eval Low Complexity: 1 Low  [facility] MS, OTR/L ascom [phone]  02/13/22, 9:47 AM

## 2022-02-14 ENCOUNTER — Telehealth: Payer: Self-pay | Admitting: Cardiovascular Disease

## 2022-02-14 NOTE — Telephone Encounter (Signed)
Patient called to make a hospital follow up appt, I offered her an appt with Barbera Setters, NP on 10/6.  Patient refused appt stating she was told she is to see Dr. Rockey Situ in a week. I advised her his first availability was in November. She said send him a message.

## 2022-02-14 NOTE — Telephone Encounter (Signed)
Spoke with patient to review needed follow up appointment. We discussed open slots and she was agreeable to see Audrey Nordmann NP here in the office. She confirmed date and time with no further questions at this time.

## 2022-02-15 ENCOUNTER — Telehealth: Payer: Self-pay | Admitting: Cardiovascular Disease

## 2022-02-15 NOTE — Telephone Encounter (Signed)
Returned the call to the patient. She stated that she was instructed to hold the Spironolactone, Jardiance and Entresto post hospital until her follow up with cardiology. These are on hold for hypotension and AKI. She has a follow up on 10/2 but is concerned with being off of the medication that long.   She is unable to check her blood pressure at home but stated that she has not been symptomatic and has been feeling fine. She denies swelling, dizziness and shortness of breath.   She has been advised to hold the medication as instructed and if Dr. Rockey Situ has any of recommendations then we will call back.

## 2022-02-15 NOTE — Telephone Encounter (Signed)
Patient said that they stopped her Spirolactone and Jardiance for a week due to being in hospital. Needs instruction on what to do next. Please call back

## 2022-02-16 NOTE — Telephone Encounter (Signed)
Patient has been scheduled to come in for 02/28/22

## 2022-02-16 NOTE — Telephone Encounter (Signed)
NA - unable to leave message

## 2022-02-16 NOTE — Telephone Encounter (Signed)
Patient returning call.

## 2022-02-16 NOTE — Telephone Encounter (Signed)
Message sent to [facility] to contact pt to reschedule p/h appt at pt's request.

## 2022-02-16 NOTE — Telephone Encounter (Signed)
Spoke with pt and advised per Dr Rockey Situ to continue to hold until seen in clinic.  Pt states understanding but would like her appt moved up since it is not scheduled until 10/2.  Advised I will have the [place] office contact her to reschedule at her request.

## 2022-02-22 ENCOUNTER — Ambulatory Visit: Payer: Medicare Other | Admitting: Medical

## 2022-02-22 DIAGNOSIS — M4728 Other spondylosis with radiculopathy, sacral and sacrococcygeal region: Secondary | ICD-10-CM | POA: Diagnosis not present

## 2022-02-22 DIAGNOSIS — M9904 Segmental and somatic dysfunction of sacral region: Secondary | ICD-10-CM | POA: Diagnosis not present

## 2022-02-22 DIAGNOSIS — M4726 Other spondylosis with radiculopathy, lumbar region: Secondary | ICD-10-CM | POA: Diagnosis not present

## 2022-02-22 DIAGNOSIS — M9903 Segmental and somatic dysfunction of lumbar region: Secondary | ICD-10-CM | POA: Diagnosis not present

## 2022-02-22 NOTE — Progress Notes (Deleted)
Cardiology Office Note:    Date:  02/22/2022   ID:  Zarin, Hagmann December 20, 1945, MRN [id number]  PCP:  Glean Hess, MD  [facility] Cardiologist:  Ida Rogue, MD  [place] Electrophysiologist:  None   Referring MD: Glean Hess, MD   Chief Complaint: Hospital follow-up  History of Present Illness:    Audrey Martinez is a 76 y.o. 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  She has a history of lung disease with chronic cough/asthma and pneumonia as a child.  She is reportedly non-smoker.  She has a history of orthostasis and previous migraines.   She was previously followed by [facility] cardiologist with history of SVT with dual AV nodal physiology and typical slow fast AV nodal reentry tachycardia.  She subsequently underwent successful radiofrequency ablation of AV nodal slow pathway but had recurrent symptoms requiring repeat EP study 12/2017.  Subsequent event monitoring showed ectopic atrial tachycardia versus atrial flutter with 2-1 block.  She did not want anticoagulation.   She established with Dr. Rockey Situ 03/2017 and underwent monitoring in 2019 in the setting of tachypalpitations.  2019 monitoring showed first-degree AV block, Mobitz 1/2, and runs of NSVT.  Echo EF 45 to 50%.  05/2018 stress testing showed a moderate size region of predominantly fixed perfusion defect of mild to moderate intensity involving the mid to distal anterior and anteroseptal walls.  Breast attenuation cannot be excluded.  No ischemia.  Since then, she has been medically managed.  Echo 2020 showed LVEF 30-35%, G2DD. Cardiac cath showed nonobstructive CAD. echo 01/09/2020 showed EF 30 to 35%, LV global  hypokinesis, average left ventricular global longitudinal strain -9.6%, mild to moderate MR, mild AR, ascending aorta 3.5 cm.  She had bilateral pulmonary embolism int he setting of COVID in 01/2020, and was placed on Xarelto.   Last seen 01/2022 by Dr. Rockey Situ, Bps were soft. It was recommended she start Jardiance for NICM.   Today,   Past Medical History:  Diagnosis Date   Actinic keratosis    Acute pulmonary embolus ([place]) 01/29/2020   Acute renal failure (ARF) ([place]) 01/31/2020   Acute respiratory failure with hypoxia ([place]) 01/29/2020   Acute septic pulmonary embolism (HCC)    AKI (acute kidney injury) ([place])    Amaurosis fugax, right eye 12/2018   Arthritis    Asthma    Basal cell carcinoma 07/2016   central forehead (tx in [place])   Cardiomyopathy [facility]    a. 04/2018 Echo: EF 45-50%. Anteroseptal and apical HK in some views. Mild AI/MR. Nl RV fxn; b. 05/2018 MV: mid-dist ant and antsept mild defect - ? breast atten. EF 41%. No ischemia; c. 03/2019 Echo: EF 30-35%, Gr2 DD, Trace MR, triv TR. Asc Ao ectatic dil - 37m.    Coronary artery disease    Dyspnea    Dysrhythmia    GERD (gastroesophageal reflux disease)    Herniated disc, lumbar    Hyperlipidemia    Left wrist fracture    [place]   Melanoma IS R post neck, excised 08/10/20   Osteoporosis    Pneumonia due to COVID-19 virus 01/26/2020   Pulmonary embolus ([place] 91/01/4173  Complication of [id number]-81infection 12/2019:  Xarelto 20 mg  daily x 6 months then PRN   Stroke ([place]) 1998   SVT (supraventricular tachycardia) ([place])    a.2008 s/p RFCA for AVNRT; b. 9 & 02/2019 Zio x 2: 1. Avg HR 89, 3 runs NSVT (longest 7 beats), Mobitz 1. 2. Avg HR 89, NSVT x 1 (5 beats), Mobitz 1. No signif arrhythmia.   Tendinitis of right knee 07/03/2017    Past Surgical History:  Procedure Laterality Date   BACK SURGERY  2017   L2-4 laminectomy and foraminal stenosis   BREAST CYST ASPIRATION Left 1977   CARDIAC  CATHETERIZATION  2009   [facility] in [place]  2009   CATARACT EXTRACTION, BILATERAL  2010   COLONOSCOPY WITH PROPOFOL N/A 03/21/2018   Procedure: COLONOSCOPY WITH Biopsy;  Surgeon: Lin Landsman, MD;  Location: [place];  Service: Endoscopy;  Laterality: N/A;   COLONOSCOPY WITH PROPOFOL N/A 05/03/2021   Procedure: COLONOSCOPY WITH PROPOFOL;  Surgeon: Lin Landsman, MD;  [facility] ENDOSCOPY;  Service: Gastroenterology;  Laterality: N/A;   CORONARY ANGIOPLASTY     derrick procedure  1977-78   bilateral    ESOPHAGOGASTRODUODENOSCOPY  2013   gastritis; done for complaint of dysphagia   ESOPHAGOGASTRODUODENOSCOPY (EGD) WITH PROPOFOL N/A 03/21/2018   Procedure: ESOPHAGOGASTRODUODENOSCOPY (EGD) WITH Biopsies;  Surgeon: Lin Landsman, MD;  Location: [place];  Service: Endoscopy;  Laterality: N/A;  KEEP THIS PATIENT FIRST   JOINT REPLACEMENT     LEFT HEART CATH AND CORONARY ANGIOGRAPHY Left 05/02/2019   Procedure: LEFT HEART CATH AND CORONARY ANGIOGRAPHY;  Surgeon: Minna Merritts, MD;  Location: [place] CV [facility];  Service: Cardiovascular;  Laterality: Left;   lymph node removal     neck   MOHS SURGERY  07/2016   BCCA nose   POLYPECTOMY N/A 03/21/2018   Procedure: POLYPECTOMY;  Surgeon: Lin Landsman, MD;  Location: [place];  Service: Endoscopy;  Laterality: N/A;   REPLACEMENT TOTAL KNEE  2013   RT   TOTAL SHOULDER REPLACEMENT  2012   RT     Current Medications: No outpatient medications have been marked as taking for the 02/22/22 encounter (Appointment) with Audrey Martinez, Audrey Politano H, PA-C.     Allergies:   Amoxicillin, Aspirin, Bacitracin-polymyx-neo-hc [bacitra-neomycin-polymyxin-hc], Ciprofloxacin, Ciprofloxacin-dexamethasone, Crestor [rosuvastatin], Erythromycin, Hydrochlorothiazide, Ibuprofen, Morphine and related, Morpholine salicylate, Omeprazole,  Doxycycline hyclate, Keflex [cephalexin], Lovastatin, Simvastatin, Neomycin-bacitracin zn-polymyx, and Tape   Social History   Socioeconomic History   Marital status: Widowed    Spouse name: Not on file   Number of children: 3   Years of education: Not on file   Highest education level: Not on file  Occupational History   Occupation: retired  Tobacco Use   Smoking status: Never   Smokeless tobacco: Never  Vaping Use   Vaping Use: Never used  Substance and Sexual Activity   Alcohol use: Not Currently    Comment: may drink 1-2x/yr   Drug use: No   Sexual activity: Never  Other Topics Concern   Not on file  Social History Narrative   ** Merged History Encounter **       Lives at home alone, has resources if help is needed   Social Determinants of Health   Financial Resource Strain: Low Risk  (04/07/2021)   Overall Financial Resource Strain (CARDIA)    Difficulty of Paying Living Expenses: Not hard at all  Food Insecurity: No Food Insecurity (02/12/2022)   Hunger Vital Sign  Worried About Charity fundraiser in the Last Year: Never true    [place] in the Last Year: Never true  Transportation Needs: No Transportation Needs (02/12/2022)   PRAPARE - Hydrologist (Medical): No    Lack of Transportation (Non-Medical): No  Physical Activity: Insufficiently Active (04/07/2021)   Exercise Vital Sign    Days of Exercise per Week: 7 days    Minutes of Exercise per Session: 20 min  Stress: No Stress Concern Present (04/06/2020)   [place]    Feeling of Stress : Not at all  Social Connections: Moderately Integrated (04/07/2021)   Social Connection and Isolation Panel [NHANES]    Frequency of Communication with Friends and Family: More than three times a week    Frequency of Social Gatherings with Friends and Family: Twice a week    Attends Religious Services: More than 4 times per  year    Active Member of [employer] or Organizations: No    Attends Music therapist: More than 4 times per year    Marital Status: Widowed     Family History: The patient's ***family history includes CAD (age of onset: 79) in her sister; Clotting disorder in her half-sister and half-sister; Diabetes in her mother; Stroke in her mother and sister; Stroke (age of onset: 18) in her sister; Valvular heart disease in her father. There is no history of Breast cancer.  ROS:   Please see the history of present illness.    *** All other systems reviewed and are negative.  EKGs/Labs/Other Studies Reviewed:    The following studies were reviewed today: ***  EKG:  EKG is *** ordered today.  The ekg ordered today demonstrates ***  Recent Labs: 02/12/2022: ALT 16 02/13/2022: BUN 24; Creatinine, Ser 1.30; Hemoglobin 13.8; Magnesium 2.4; Platelets 161; Potassium 4.1; Sodium 145  Recent Lipid Panel    Component Value Date/Time   CHOL 226 (Martinez) 06/22/2021 0859   TRIG 136 06/22/2021 0859   HDL 66 06/22/2021 0859   CHOLHDL 3.4 06/22/2021 0859   CHOLHDL 2.8 01/30/2020 0547   VLDL 5 01/30/2020 0547   LDLCALC 136 (Martinez) 06/22/2021 0859     Risk Assessment/Calculations:   {Does this patient have ATRIAL FIBRILLATION?[phone]}   Physical Exam:    VS:  There were no vitals taken for this visit.    Wt Readings from Last 3 Encounters:  02/12/22 237 lb 14.4 oz (107.9 kg)  01/28/22 241 lb 6 oz (109.5 kg)  11/17/21 242 lb 1 oz (109.8 kg)     GEN: *** Well nourished, well developed in no acute distress HEENT: Normal NECK: No JVD; No carotid bruits LYMPHATICS: No lymphadenopathy CARDIAC: ***RRR, no murmurs, rubs, gallops RESPIRATORY:  Clear to auscultation without rales, wheezing or rhonchi  ABDOMEN: Soft, non-tender, non-distended MUSCULOSKELETAL:  No edema; No deformity  SKIN: Warm and dry NEUROLOGIC:  Alert and oriented x 3 PSYCHIATRIC:  Normal affect   ASSESSMENT:    No  diagnosis found. PLAN:    In order of problems listed above:  ***  Disposition: Follow up {follow up:15908} with ***   Shared Decision Making/Informed Consent   {Are you ordering a CV Procedure (e.g. stress test, cath, DCCV, TEE, etc)?   Press F2        :[id number]}    Signed, Joshuah Minella Arlyss Repress  02/22/2022 7:43 AM    [facility]

## 2022-02-23 NOTE — Progress Notes (Unsigned)
Cardiology Office Note    Date:  02/24/2022   ID:  Audrey, Martinez 02/08/1946, MRN [id number]  PCP:  Glean Hess, MD  Cardiologist:  Ida Rogue, MD  Electrophysiologist:  None   Chief Complaint: Hospital follow-up.  History of Present Illness:   Audrey Martinez is a 76 y.o. female with history of nonobstructive CAD, atrial tachycardia/SVT/AVNRT status post ablation x2, HFrEF secondary to NICM, bilateral pulmonary emboli in 01/2020 in the setting of COVID infection, TIA, HTN, HLD intolerant to statins and Zetia, multiple medication intolerances, and orthostasis who presents for hospital follow-up as outlined below.  Patient previously followed by cardiology in [place].  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  No sustained atrial tach/no inducible atrial flutter.  She underwent successful radiofrequency ablation of AV nodal slow pathway.  Following this, she had recurrent symptoms shortly thereafter and had an event monitor and repeat EP study on 01/21/2008: Markedly prolonged at baseline AH interval (AV nodal function is subnormal).  No inducible tachycardia; only inducible double echo beats.  As per EP, event monitor with ectopic atrial tachycardia versus atrial flutter with 2-1 block.  Notes indicated she did not want anticoagulation.  Prior echo from 12/13/2006 showed an LVEF of 55 to 60%, no significant valvular abnormalities.  ETT on 09/28/2007 showed Bruce protocol with the patient exercising for 5-minutes and 1 second achieving 7 METS with a maximum heart rate of 179 bpm (111% MPHR).  Pretest EKG was normal with a heart rate of 96 bpm.  No chest pain noted during test.  No significant ischemia was noted.  Stress echo on 11/14/2008 showed Bruce protocol with the patient exercising for 4 minutes and 31 seconds achieving 4.6 METS and 113% MPHR.  Echocardiogram showed an LVEF of 60% with no significant ischemia.  She  established with Dr. Rockey Situ in 2018.  She declined statin, aspirin, Plavix, and anticoagulation.  She was seen in late 2019 with tachypalpitations with outpatient cardiac monitoring showing sinus rhythm with an average heart of 87 bpm, first-degree AV block, type I second-degree AV block, 2 runs of NSVT with the fastest and longest interval lasting 16 beats, isolated PACs, and atrial triplets with rare isolated PVCs, ventricular couplets and triplets, ventricular bigeminy was noted.  Patient triggered events were not associated with significant arrhythmia.  Follow-up echo showed an EF of 45 to 50%, mild diffuse hypokinesis with select images showing possible anteroseptal and apical hypokinesis, mild aortic regurgitation, mild mitral regurgitation, normal size left atrium, normal RV systolic function, normal PASP.  Subsequent Lexiscan Myoview in 05/2018 showed no significant ischemia with a moderate size region of predominantly fixed perfusion defect of mild to moderate intensity of the mid to distal anterior and anterior septal wall unable to exclude breast attenuation artifact, EF 41%, overall low to moderate risk scan.  For vision disturbances, she underwent carotid artery ultrasound on 01/11/2019 showing minor carotid artery atherosclerosis with no hemodynamically significant internal carotid artery stenosis and antegrade vertebral artery flow bilaterally.  Zio patch in 01/2019 showed sinus rhythm with 3 episodes of NSVT with the longest episode lasting 7 beats, second-degree AV block type I, and rare atrial/ventricular ectopy.  Repeat Zio patch in 02/2021 showed 1 run of NSVT lasting 5 beats, second-degree AV block type I, and no significant arrhythmia.  Echo in 03/2019 showed an EF of 30 to 93%, grade 2 diastolic dysfunction, normal RV systolic function and ventricular cavity size, trace mitral  regurgitation, trivial tricuspid regurgitation, normal PASP, and aortic dilatation.  Given her cardiomyopathy, she  underwent LHC in 04/2019 which showed no obstructive CAD.  Most recent echo from 12/2019 showed an EF of 30 to 35%, global hypokinesis, normal RV systolic function and ventricular cavity size, normal PASP, mild to moderate mitral regurgitation, and an ascending aorta measuring 3.5 cm.  She was seen in the ED in 10/2021 with jaw numbness with CT of the head showing no acute intracranial abnormality.  EKG showed sinus rhythm with a first-degree AV block.  Troponin negative.  She followed up with her primary cardiologist in 01/2022 and was without symptoms of angina or decompensation.  She was started on Jardiance, otherwise orthostasis precluded escalation of GDMT, with recommendation to continue carvedilol, Entresto, and spironolactone.  She was admitted to the hospital from 9/16 through 02/13/2022 with presyncope felt to be related to orthostatic hypotension from dehydration.  She had just stood up from getting out of bed, felt dizzy and fell backwards.  No frank syncope.  No associated palpitations, angina, or dyspnea.  Leading up to the event, she felt well and was at her baseline.  High-sensitivity troponin negative x2.  At discharge, Entresto, spironolactone, and Jardiance were held.  Initial serum creatinine 1.52 with a baseline around 1.1, improved to 1.3 prior to discharge.  She comes in doing reasonably well from a cardiac perspective, and is without symptoms of angina or decompensation.  No further dizziness or presyncope.  No syncope.  No significant lower extremity swelling.  She is currently taking carvedilol 12.5 mg twice daily along with Entresto 24/26 mg twice daily.  Not currently on a Jardiance or spironolactone.  No significant palpitation burden.  Weight stable.   Labs independently reviewed: 01/2022 - potassium 4.1, magnesium 2.4, Hgb 13.8, PLT 161, BUN 24, serum creatinine 1.3, albumin 3.9, AST/ALT normal 05/2021 - TC 226, TG 136, HDL 66, LDL 136 12/2019 - TSH normal  Past Medical  History:  Diagnosis Date   Actinic keratosis    Acute pulmonary embolus ([place]) 01/29/2020   Acute renal failure (ARF) ([place]) 01/31/2020   Acute respiratory failure with hypoxia ([place]) 01/29/2020   Acute septic pulmonary embolism ([facility])    AKI (acute kidney injury) ([place])    Amaurosis fugax, right eye 12/2018   Arthritis    Asthma    Basal cell carcinoma 07/2016   central forehead (tx in [place])   Cardiomyopathy [facility])    a. 04/2018 Echo: EF 45-50%. Anteroseptal and apical HK in some views. Mild AI/MR. Nl RV fxn; b. 05/2018 MV: mid-dist ant and antsept mild defect - ? breast atten. EF 41%. No ischemia; c. 03/2019 Echo: EF 30-35%, Gr2 DD, Trace MR, triv TR. Asc Ao ectatic dil - 52m.    Coronary artery disease    Dyspnea    Dysrhythmia    GERD (gastroesophageal reflux disease)    Herniated disc, lumbar    Hyperlipidemia    Left wrist fracture    Melanoma ([facility] 05/12/2020   Melanoma IS R post neck, excised 08/10/20   Osteoporosis    Pneumonia due to COVID-19 virus 01/26/2020   Pulmonary embolus ([facility] 96/07/2295  Complication of [id number] 12/2019:  Xarelto 20 mg daily x 6 months then PRN   Stroke ([facility] 1998   SVT [facility]ra[facility]ricular tachycardia) [place]    a.2008 s/p RFCA for AVNRT; b. 9 & 02/2019 Zio x 2: 1. Avg HR 89, 3 runs NSVT (longest 7 beats), Mobitz 1. 2. Avg HR 89,  NSVT x 1 (5 beats), Mobitz 1. No signif arrhythmia.   Tendinitis of right knee 07/03/2017    Past Surgical History:  Procedure Laterality Date   BACK SURGERY  2017   L2-4 laminectomy and foraminal stenosis   BREAST CYST ASPIRATION Left 1977   CARDIAC CATHETERIZATION  2009   [facility] in [place]  2009   CATARACT EXTRACTION, BILATERAL  2010   COLONOSCOPY WITH PROPOFOL N/A 03/21/2018   Procedure: COLONOSCOPY WITH Biopsy;  Surgeon: Lin Landsman, MD;  Location: [facility];  Service: Endoscopy;  Laterality: N/A;   COLONOSCOPY  WITH PROPOFOL N/A 05/03/2021   Procedure: COLONOSCOPY WITH PROPOFOL;  Surgeon: Lin Landsman, MD;  Location: [facility] ENDOSCOPY;  Service: Gastroenterology;  Laterality: N/A;   CORONARY ANGIOPLASTY     derrick procedure  1977-78   bilateral    ESOPHAGOGASTRODUODENOSCOPY  2013   gastritis; done for complaint of dysphagia   ESOPHAGOGASTRODUODENOSCOPY (EGD) WITH PROPOFOL N/A 03/21/2018   Procedure: ESOPHAGOGASTRODUODENOSCOPY (EGD) WITH Biopsies;  Surgeon: Lin Landsman, MD;  Location: [facility];  Service: Endoscopy;  Laterality: N/A;  KEEP THIS PATIENT FIRST   JOINT REPLACEMENT     LEFT HEART CATH AND CORONARY ANGIOGRAPHY Left 05/02/2019   Procedure: LEFT HEART CATH AND CORONARY ANGIOGRAPHY;  Surgeon: Minna Merritts, MD;  Location: [facility] CV LAB;  Service: Cardiovascular;  Laterality: Left;   lymph node removal     neck   MOHS SURGERY  07/2016   BCCA nose   POLYPECTOMY N/A 03/21/2018   Procedure: POLYPECTOMY;  Surgeon: Lin Landsman, MD;  Location: [facility];  Service: Endoscopy;  Laterality: N/A;   REPLACEMENT TOTAL KNEE  2013   RT   TOTAL SHOULDER REPLACEMENT  2012   RT     Current Medications: Current Meds  Medication Sig   albuterol (PROVENTIL HFA;VENTOLIN HFA) 108 (90 Base) MCG/ACT inhaler Inhale 1-2 puffs into the lungs every 6 (six) hours as needed for wheezing or shortness of breath.    B Complex-C (B-COMPLEX WITH VITAMIN C) tablet Take 1 tablet by mouth daily.    baclofen (LIORESAL) 10 MG tablet Take 1 tablet (10 mg total) by mouth 2 (two) times daily. Home med.   Bioflavonoid Products (ESTER-C) TABS Take 1 tablet by mouth 2 (two) times daily.    calcium carbonate (OS-CAL - DOSED IN MG OF ELEMENTAL CALCIUM) 1250 (500 Ca) MG tablet Take 1 tablet by mouth.   carvedilol (COREG) 12.5 MG tablet Take 1 tablet (12.5 mg total) by mouth 2 (two) times daily with a meal.   cetirizine (ZYRTEC) 10 MG tablet Take 10 mg by mouth daily.    cholecalciferol (VITAMIN D) 25 MCG (1000 UT) tablet Take 1,000 Units by mouth daily.   Cranberry 1000 MG CAPS Take 1,000 mg by mouth daily.    empagliflozin (JARDIANCE) 10 MG TABS tablet Hold until followup with cardiology due to hypotension, dehydration and acute kidney injury.   ezetimibe (ZETIA) 10 MG tablet TAKE 1 TABLET BY MOUTH EVERY DAY   famotidine (PEPCID) 20 MG tablet Take 20 mg by mouth 2 (two) times daily.   fluticasone (FLOVENT HFA) 110 MCG/ACT inhaler INHALE 1 PUFFS INTO THE LUNGS TWICE A DAY.  Home med.   gabapentin (NEURONTIN) 300 MG capsule Take 1 capsule (300 mg total) by mouth 2 (two) times daily. Home med.   Ginger, Zingiber officinalis, (GINGER PO) Take 1,100 mg by mouth daily.  ipratropium (ATROVENT) 0.03 % nasal spray SMARTSIG:1-2 Spray(s) Both Nares 3 Times Daily   meloxicam (MOBIC) 15 MG tablet TAKE 1 TABLET BY MOUTH EVERY DAY AS NEEDED FOR PAIN   Misc Natural Products (TART CHERRY ADVANCED PO) Take 1 capsule by mouth daily.    montelukast (SINGULAIR) 10 MG tablet Take 10 mg by mouth every evening.   Multiple Vitamins-Minerals (MULTIVITAMIN WITH MINERALS) tablet Take 1 tablet by mouth daily. Centrum Silver   Olodaterol HCl (STRIVERDI RESPIMAT) 2.5 MCG/ACT AERS Inhale 2 puffs into the lungs daily. USE AT THE SAME TIME EVERYDAY   Probiotic Product (PROBIOTIC DAILY PO) Take 1 capsule by mouth 2 (two) times a week.    rivaroxaban (XARELTO) 20 MG TABS tablet Take 1 tablet (20 mg total) by mouth as needed (PRN for long car or plane ride).   sacubitril-valsartan (ENTRESTO) 24-26 MG Hold until followup with cardiology due to hypotension, dehydration and acute kidney injury.   spironolactone (ALDACTONE) 25 MG tablet Hold until followup with cardiology due to hypotension, dehydration and acute kidney injury.   Turmeric 500 MG CAPS Take 500 mg by mouth daily.    zinc sulfate 220 (50 Zn) MG capsule Take 1 capsule (220 mg total) by mouth daily.    Allergies:   Amoxicillin,  Aspirin, Bacitracin-polymyx-neo-hc [bacitra-neomycin-polymyxin-hc], Ciprofloxacin, Ciprofloxacin-dexamethasone, Crestor [rosuvastatin], Erythromycin, Hydrochlorothiazide, Ibuprofen, Morphine and related, Morpholine salicylate, Omeprazole, Doxycycline hyclate, Keflex [cephalexin], Lovastatin, Simvastatin, Neomycin-bacitracin zn-polymyx, and Tape   Social History   Socioeconomic History   Marital status: Widowed    Spouse name: Not on file   Number of children: 3   Years of education: Not on file   Highest education level: Not on file  Occupational History   Occupation: retired  Tobacco Use   Smoking status: Never   Smokeless tobacco: Never  Vaping Use   Vaping Use: Never used  Substance and Sexual Activity   Alcohol use: Not Currently    Comment: may drink 1-2x/yr   Drug use: No   Sexual activity: Never  Other Topics Concern   Not on file  Social History Narrative   ** Merged History Encounter **       Lives at home alone, has resources if help is needed   Social Determinants of Health   Financial Resource Strain: Low Risk  (04/07/2021)   Overall Financial Resource Strain (CARDIA)    Difficulty of Paying Living Expenses: Not hard at all  Food Insecurity: No Food Insecurity (02/12/2022)   Hunger Vital Sign    Worried About Running Out of Food in the Last Year: Never true    [place] in the Last Year: Never true  Transportation Needs: No Transportation Needs (02/12/2022)   PRAPARE - Hydrologist (Medical): No    Lack of Transportation (Non-Medical): No  Physical Activity: Insufficiently Active (04/07/2021)   Exercise Vital Sign    Days of Exercise per Week: 7 days    Minutes of Exercise per Session: 20 min  Stress: No Stress Concern Present (04/06/2020)   [place]    Feeling of Stress : Not at all  Social Connections: Moderately Integrated (04/07/2021)   Social Connection  and Isolation Panel [NHANES]    Frequency of Communication with Friends and Family: More than three times a week    Frequency of Social Gatherings with Friends and Family: Twice a week    Attends Religious Services: More than 4 times per year  Active Member of Clubs or Organizations: No    Attends Archivist Meetings: More than 4 times per year    Marital Status: Widowed     Family History:  The patient's family history includes CAD (age of onset: 2) in her sister; Clotting disorder in her half-sister and half-sister; Diabetes in her mother; Stroke in her mother and sister; Stroke (age of onset: 21) in her sister; Valvular heart disease in her father. There is no history of Breast cancer.  ROS:   12-point review of systems is negative unless otherwise noted in HPI.   EKGs/Labs/Other Studies Reviewed:    Studies reviewed were summarized above. The additional studies were reviewed today:  2D echo 01/09/2020: 1. Left ventricular ejection fraction, by estimation, is 30 to 35%. The  left ventricle has moderately decreased function. The left ventricle  demonstrates global hypokinesis. Left ventricular diastolic parameters are  indeterminate. The average left  ventricular global longitudinal strain is -9.6 %.   2. Right ventricular systolic function is normal. The right ventricular  size is normal. There is normal pulmonary artery systolic pressure.   3. The mitral valve is normal in structure. Mild to moderate mitral valve  regurgitation.   4. The aortic valve was not well visualized. Aortic valve regurgitation  is mild.   5. Ascending aorta 3.5 cm __________  LHC 05/02/2019: Coronary dominance: Right  Left mainstem:   Large vessel that bifurcates into the LAD and left circumflex, no significant disease noted  Left anterior descending (LAD):   Large vessel that extends to the apical region, diagonal branch 2 of moderate size, no significant disease noted  Left  circumflex (LCx):  Large vessel with OM branch 2, no significant disease noted  Right coronary artery (RCA):  Right dominant vessel with PL and PDA, no significant disease noted  Left ventriculography: Left ventricular systolic function is moderately depressed,  LVEF is estimated at 30 to 35%, there is no significant mitral regurgitation , no significant aortic valve stenosis  Final Conclusions:   Nonobstructive CAD, EF estimated at 30 to 35%, global hypokinesis,  LVEDP 17 to 20 mm Hg  Recommendations:  Medical management difficult secondary to numerous medical intolerances, Reports having diarrhea on low dose coreg Will discuss with her as an outpt, Unable to exclude hypertensive cardiomyopathy, Suggested she monitor BP at home and call our office with numbers She might benefit from lasix PRN __________  2D echo 04/17/2019: 1. Left ventricular ejection fraction, by visual estimation, is 30 to  35%. The left ventricle has moderate to severely decreased function. There  is no left ventricular hypertrophy.   2. Left ventricular diastolic parameters are consistent with Grade II  diastolic dysfunction (pseudonormalization).   3. Global right ventricle has normal systolic function.The right  ventricular size is normal. Right vetricular wall thickness was not  assessed.   4. Left atrial size was normal.   5. Right atrial size was normal.   6. The mitral valve is normal in structure. Trace mitral valve  regurgitation.   7. The tricuspid valve is grossly normal. Tricuspid valve regurgitation  is trivial.   8. The aortic valve is tricuspid. Aortic valve regurgitation is not  visualized.   9. The pulmonic valve was not well visualized. Pulmonic valve  regurgitation is not visualized.  10. Aortic dilatation noted.  11. There is ectatic dilatation of the ascending aorta measuring 38 mm.  12. Normal pulmonary artery systolic pressure. __________  Zio patch 02/2019: Normal sinus  rhythm  avg HR of 89 bpm.    1 run of Ventricular Tachycardia occurred lasting 5 beats with a max rate of 174 bpm (avg 133 bpm).    Second Degree AV Block-Mobitz I (Wenckebach) was present. Isolated SVEs were rare (<1.0%), SVE Couplets were rare (<1.0%), and SVE Triplets were rare (<1.0%). Isolated VEs were rare (<1.0%), VE Couplets were rare (<1.0%), and no VE Triplets were present. Ventricular Trigeminy was present.   Patient triggered events were not associated with significant arrhythmia __________  Elwyn Reach patch 01/2019: Normal sinus rhythm avg HR of 89 bpm.   3 Ventricular Tachycardia runs occurred, the run with the fastest interval lasting 5 beats with a max rate of 200 bpm, the longest lasting 7 beats with an avg rate of 141 bpm.    Second Degree AV Block-Mobitz I (Wenckebach) was present. Isolated SVEs were rare (<1.0%), SVE Couplets were rare (<1.0%), and SVE Triplets were rare (<1.0%). Isolated VEs were rare (<1.0%, 3119), VE Couplets were rare (<1.0%, 171), and VE Triplets were rare (<1.0%, 1). Ventricular Bigeminy and Trigeminy were present.   Patient triggered events were not associated with significant arrhythmia. __________  Carotid artery ultrasound 01/11/2019: IMPRESSION: Minor carotid atherosclerosis. No hemodynamically significant ICA stenosis. Degree of narrowing less than 50% bilaterally by ultrasound criteria.   Patent antegrade vertebral flow bilaterally __________  Carlton Adam MPI 05/31/2018: Pharmacological myocardial perfusion imaging study with no significant  Ischemia Moderate size region of predominantly fixed perfusion defect of mild to moderate intensity of the mid to distal anterior and anteroseptal wall Unable to exclude breast attenuation artifact Normal wall motion, EF estimated at 41%, Depressed ejection fraction possibly secondary to GI uptake artifact No EKG changes concerning for ischemia at peak stress or in recovery. Low to moderate risk  scan __________  2D echo 05/18/2018: - Left ventricle: The cavity size was normal. Systolic function was    mildly reduced. The estimated ejection fraction was in the range    of 45% to 50%. Mild diffuse hypokinesis. Regional wall motion    abnormalities cannot be excluded. Select images with anteroseptal    and apical hypokinesis. The study is not technically sufficient    to allow evaluation of LV diastolic function.  - Aortic valve: There was mild regurgitation.  - Mitral valve: There was mild regurgitation.  - Left atrium: The atrium was normal in size.  - Right ventricle: Systolic function was normal.  - Pulmonary arteries: Systolic pressure was within the normal    range. __________  Elwyn Reach patch 03/2018: Sinus rhythm   Avg HR of 87 bpm.  First Degree AV Block was present.  Second Degree AV Block-Mobitz I (Wenckebach) was present.    2 Ventricular Tachycardia runs occurred,  the run with the fastest and longest interval lasting 16 beats with avg 183 bpm   Isolated SVEs were rare (<1.0%), SVE Triplets were rare (<1.0%), and no SVE Couplets were present.  Isolated VEs were rare (<1.0%, 2358), VE Couplets were rare (<1.0%, 140), and VE Triplets were rare (<1.0%, 3).  Ventricular Bigeminy was present.   Patient triggered events were not associated with significant arrhythmia    EKG:  EKG is ordered today.  The EKG ordered today demonstrates NSR, 78 bpm, first-degree AV block, incomplete LBBB, consistent with prior tracing  Recent Labs: 02/12/2022: ALT 16 02/13/2022: BUN 24; Creatinine, Ser 1.30; Hemoglobin 13.8; Magnesium 2.4; Platelets 161; Potassium 4.1; Sodium 145  Recent Lipid Panel    Component Value Date/Time   CHOL 226 (H)  06/22/2021 0859   TRIG 136 06/22/2021 0859   HDL 66 06/22/2021 0859   CHOLHDL 3.4 06/22/2021 0859   CHOLHDL 2.8 01/30/2020 0547   VLDL 5 01/30/2020 0547   LDLCALC 136 (H) 06/22/2021 0859    PHYSICAL EXAM:    VS:  BP 117/79 (BP Location:  Left Arm, Patient Position: Sitting, Cuff Size: Large)   Pulse 78   Ht '5\' 7"'$  (1.702 m)   Wt 239 lb 3.2 oz (108.5 kg)   SpO2 94%   BMI 37.46 kg/m   BMI: Body mass index is 37.46 kg/m.  Physical Exam Vitals reviewed.  Constitutional:      Appearance: She is well-developed.  HENT:     Head: Normocephalic and atraumatic.  Eyes:     General:        Right eye: No discharge.        Left eye: No discharge.  Neck:     Vascular: No JVD.  Cardiovascular:     Rate and Rhythm: Normal rate and regular rhythm.     Heart sounds: Normal heart sounds, S1 normal and S2 normal. Heart sounds not distant. No midsystolic click and no opening snap. No murmur heard.    No friction rub.  Pulmonary:     Effort: Pulmonary effort is normal. No respiratory distress.     Breath sounds: Normal breath sounds. No decreased breath sounds, wheezing or rales.  Chest:     Chest wall: No tenderness.  Abdominal:     General: There is no distension.  Musculoskeletal:     Cervical back: Normal range of motion.     Right lower leg: No edema.     Left lower leg: No edema.  Skin:    General: Skin is warm and dry.     Nails: There is no clubbing.  Neurological:     Mental Status: She is alert and oriented to person, place, and time.  Psychiatric:        Speech: Speech normal.        Behavior: Behavior normal.        Thought Content: Thought content normal.        Judgment: Judgment normal.     Wt Readings from Last 3 Encounters:  02/24/22 239 lb 3.2 oz (108.5 kg)  02/12/22 237 lb 14.4 oz (107.9 kg)  01/28/22 241 lb 6 oz (109.5 kg)     ASSESSMENT & PLAN:   HFrEF secondary to NICM: She appears euvolemic, well compensated.  Currently on carvedilol 12.5 mg twice daily and Entresto 24/26 mg twice daily.  At her last visit, Jardiance 10 mg was added to GDMT at that time, including carvedilol, Entresto, and spironolactone.  With this, she developed orthostasis with associated hypotension and near syncope.  Since  hospital discharge, she has remained off Jardiance and spironolactone.  Check a BMP today with plans to reinitiate lower dose of spironolactone if renal function and potassium allow.  Moving forward, could consider rechallenge of Jardiance pending blood pressure, symptoms, and labs.  If, despite maximally tolerated GDMT her cardiomyopathy persists, would recommend referral to EP for consideration of ICD.  Presyncope: Felt to be in the setting of orthostasis with associated hypotension and dehydration.  Resolved.  Atrial tachycardia/SVT: Quiescent on current dose carvedilol.  TIA: No new symptoms/deficits.  Aspirin allergy.  Has previously declined clopidogrel.  Primary cardiologist has the patient on as needed Xarelto for long travel.  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  Primary cardiologist has the patient taking rivaroxaban as needed for long travel.  Defer management to them.  No symptoms concerning for bleeding.  HTN: Blood pressure is well controlled in the office today.  Continue medical therapy as above.  HLD: LDL 136 in 05/2021.  Intolerant to statins.  Remains on ezetimibe.   Disposition: F/u with Dr. Rockey Situ or an APP in 1 month.   Medication Adjustments/Labs and Tests Ordered: Current medicines are reviewed at length with the patient today.  Concerns regarding medicines are outlined above. Medication changes, Labs and Tests ordered today are summarized above and listed in the Patient Instructions accessible in Encounters.   Signed, Christell Faith, PA-C 02/24/2022 4:43 PM     [facility] [address] [place] [address] [place], [place] [address] [phone]

## 2022-02-24 ENCOUNTER — Other Ambulatory Visit: Payer: Self-pay

## 2022-02-24 ENCOUNTER — Ambulatory Visit: Payer: Medicare Other | Attending: Physician Assistant | Admitting: Physician Assistant

## 2022-02-24 ENCOUNTER — Emergency Department
Admission: EM | Admit: 2022-02-24 | Discharge: 2022-02-24 | Disposition: A | Discharge status: Home / Self Care | Payer: Medicare Other | Attending: Emergency Medicine | Admitting: Emergency Medicine

## 2022-02-24 ENCOUNTER — Emergency Department: Payer: Medicare Other

## 2022-02-24 ENCOUNTER — Encounter: Payer: Self-pay | Admitting: Physician Assistant

## 2022-02-24 ENCOUNTER — Other Ambulatory Visit
Admission: RE | Admit: 2022-02-24 | Discharge: 2022-02-24 | Disposition: A | Discharge status: Home / Self Care | Payer: Medicare Other | Source: Ambulatory Visit | Attending: Physician Assistant | Admitting: Physician Assistant

## 2022-02-24 VITALS — BP 117/79 | HR 78 | Ht 67.0 in | Wt 239.2 lb

## 2022-02-24 DIAGNOSIS — W19XXXA Unspecified fall, initial encounter: Secondary | ICD-10-CM

## 2022-02-24 DIAGNOSIS — S29001A Unspecified injury of muscle and tendon of front wall of thorax, initial encounter: Secondary | ICD-10-CM | POA: Diagnosis present

## 2022-02-24 DIAGNOSIS — Z86711 Personal history of pulmonary embolism: Secondary | ICD-10-CM | POA: Diagnosis not present

## 2022-02-24 DIAGNOSIS — S60221A Contusion of right hand, initial encounter: Secondary | ICD-10-CM | POA: Diagnosis not present

## 2022-02-24 DIAGNOSIS — R55 Syncope and collapse: Secondary | ICD-10-CM | POA: Diagnosis not present

## 2022-02-24 DIAGNOSIS — Z8673 Personal history of transient ischemic attack (TIA), and cerebral infarction without residual deficits: Secondary | ICD-10-CM | POA: Diagnosis not present

## 2022-02-24 DIAGNOSIS — I471 Supraventricular tachycardia: Secondary | ICD-10-CM | POA: Insufficient documentation

## 2022-02-24 DIAGNOSIS — S60211A Contusion of right wrist, initial encounter: Secondary | ICD-10-CM | POA: Diagnosis not present

## 2022-02-24 DIAGNOSIS — I428 Other cardiomyopathies: Secondary | ICD-10-CM | POA: Diagnosis not present

## 2022-02-24 DIAGNOSIS — S20219A Contusion of unspecified front wall of thorax, initial encounter: Secondary | ICD-10-CM | POA: Diagnosis not present

## 2022-02-24 DIAGNOSIS — R42 Dizziness and giddiness: Secondary | ICD-10-CM | POA: Diagnosis not present

## 2022-02-24 DIAGNOSIS — M549 Dorsalgia, unspecified: Secondary | ICD-10-CM | POA: Insufficient documentation

## 2022-02-24 DIAGNOSIS — I10 Essential (primary) hypertension: Secondary | ICD-10-CM | POA: Diagnosis not present

## 2022-02-24 DIAGNOSIS — E782 Mixed hyperlipidemia: Secondary | ICD-10-CM | POA: Diagnosis not present

## 2022-02-24 DIAGNOSIS — W1839XA Other fall on same level, initial encounter: Secondary | ICD-10-CM | POA: Insufficient documentation

## 2022-02-24 DIAGNOSIS — I5020 Unspecified systolic (congestive) heart failure: Secondary | ICD-10-CM

## 2022-02-24 DIAGNOSIS — T07XXXA Unspecified multiple injuries, initial encounter: Secondary | ICD-10-CM

## 2022-02-24 DIAGNOSIS — R079 Chest pain, unspecified: Secondary | ICD-10-CM | POA: Diagnosis not present

## 2022-02-24 DIAGNOSIS — M25531 Pain in right wrist: Secondary | ICD-10-CM | POA: Diagnosis not present

## 2022-02-24 DIAGNOSIS — J939 Pneumothorax, unspecified: Secondary | ICD-10-CM | POA: Diagnosis not present

## 2022-02-24 DIAGNOSIS — S20211A Contusion of right front wall of thorax, initial encounter: Secondary | ICD-10-CM | POA: Diagnosis not present

## 2022-02-24 LAB — BASIC METABOLIC PANEL
Anion gap: 7 (ref 5–15)
BUN: 24 mg/dL — ABNORMAL HIGH (ref 8–23)
CO2: 27 mmol/L (ref 22–32)
Calcium: 9.9 mg/dL (ref 8.9–10.3)
Chloride: 111 mmol/L (ref 98–111)
Creatinine, Ser: 1.32 mg/dL — ABNORMAL HIGH (ref 0.44–1.00)
GFR, Estimated: 42 mL/min — ABNORMAL LOW (ref >60)
Glucose, Bld: 107 mg/dL — ABNORMAL HIGH (ref 70–99)
Potassium: 4.4 mmol/L (ref 3.5–5.1)
Sodium: 145 mmol/L (ref 135–145)

## 2022-02-24 NOTE — ED Triage Notes (Signed)
Pt arrives with c/o right wrist and back pain after a fall. Pt denies hitting her head or LOC. Pt a&ox4.

## 2022-02-24 NOTE — Patient Instructions (Signed)
Medication Instructions:  - Your physician recommends that you continue on your current medications as directed. Please refer to the Current Medication list given to you today.  *If you need a refill on your cardiac medications before your next appointment, please call your pharmacy*   Lab Work: - Your physician recommends that you have lab work today:   [place] Entrance at [facility] 1st desk on the right to check in (REGISTRATION)  Lab hours: Monday- Friday (7:30 am- 5:30 pm)   If you have labs (blood work) drawn today and your tests are completely normal, you will receive your results only by: MyChart Message (if you have MyChart) OR A paper copy in the mail If you have any lab test that is abnormal or we need to change your treatment, we will call you to review the results.   Testing/Procedures: - none ordered   Follow-Up: At [facility], you and your health needs are our priority.  As part of our continuing mission to provide you with exceptional heart care, we have created designated Provider Care Teams.  These Care Teams include your primary Cardiologist (physician) and Advanced Practice Providers (APPs -  Physician Assistants and Nurse Practitioners) who all work together to provide you with the care you need, when you need it.  We recommend signing up for the patient portal called "MyChart".  Sign up information is provided on this After Visit Summary.  MyChart is used to connect with patients for Virtual Visits (Telemedicine).  Patients are able to view lab/test results, encounter notes, upcoming appointments, etc.  Non-urgent messages can be sent to your provider as well.   To learn more about what you can do with MyChart, go to [email or web address].    Your next appointment:   1 month(s)  The format for your next appointment:   In Person  Provider:   Christell Faith, PA-C    Other Instructions N/a  Important Information About [place]

## 2022-02-24 NOTE — ED Provider Notes (Signed)
[facility] Provider Note  Patient Contact: 5:07 PM (approximate)   History   Fall   HPI  Audrey Martinez is a 76 y.o. female who presents the emergency department complaining of wrist and back pain after a mechanical fall.  Patient states that she was wearing some slick shoes as she entered a 4-year that is very slick.  Patient's feet came out from underneath her and she fell onto her right side.  She tried to catch herself with her right hand is complaining of right wrist and right posterior rib pain.  No shortness of breath.  She did not hit her head or lose consciousness.  She had no medications prior to arrival.  No history of previous injuries to the ribs or wrist.  No other complaints at this time.     Physical Exam   Triage Vital Signs: ED Triage Vitals [02/24/22 1512]  Enc Vitals Group     BP 118/78     Pulse Rate 84     Resp 18     Temp 97.9 F (36.6 C)     Temp Source Oral     SpO2 92 %     Weight      Height      Head Circumference      Peak Flow      Pain Score 6     Pain Loc      Pain Edu?      Excl. in [place]?     Most recent vital signs: Vitals:   02/24/22 1512  BP: 118/78  Pulse: 84  Resp: 18  Temp: 97.9 F (36.6 C)  SpO2: 92%     General: Alert and in no acute distress.  Head: No acute traumatic findings  Neck: No stridor. No cervical spine tenderness to palpation.  Cardiovascular:  Good peripheral perfusion Respiratory: Normal respiratory effort without tachypnea or retractions. Lungs CTAB. Good air entry to the bases with no decreased or absent breath sounds. Musculoskeletal: Full range of motion to all extremities.  Visualization of the right wrist reveals no deformity.  Good range of motion preserved.  Distal radius and ulna tenderness.  Patient with no visible deformity to the shoulder, ribs, no paradoxical chest wall movement.  Palpation reveals tenderness along the posterior ribs without palpable abnormality or  crepitus.  Good underlying breath sounds bilaterally. Neurologic:  No gross focal neurologic deficits are appreciated.  Skin:   No rash noted Other:   ED Results / Procedures / Treatments   Labs (all labs ordered are listed, but only abnormal results are displayed) Labs Reviewed - No data to display   EKG     RADIOLOGY  I personally viewed, evaluated, and interpreted these images as part of my medical decision making, as well as reviewing the written report by the radiologist.  ED Provider Interpretation: No acute traumatic findings on x-ray of the chest or breast  DG Chest 2 View  Result Date: 02/24/2022 CLINICAL DATA:  Patient reports she was at her cardiology appointment today when she tripped and fell onto her right side. Patient now complaining of right sided chest pain and right wrist pain. pain EXAM: CHEST - 2 VIEW COMPARISON:  None Available. FINDINGS: Normal cardiac silhouette. Chronic elevation of RIGHT hemidiaphragm. Lungs are clear. Pneumothorax pleural fluid. RIGHT shoulder arthroplasty noted. Evidence of rib fracture shoulder dislocation. IMPRESSION: No evidence of thoracic trauma. Electronically Signed   By: Suzy Bouchard M.D.   On: 02/24/2022 15:59  DG Wrist Complete Right  Result Date: 02/24/2022 CLINICAL DATA:  Pain EXAM: RIGHT WRIST - COMPLETE 3+ VIEW COMPARISON:  Radiograph 01/22/2021 FINDINGS: There is no evidence of acute fracture. Unchanged distal ulna deformity, which may be posttraumatic. 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 There is moderate radiocarpal and mild base of thumb osteoarthritis. IMPRESSION: No evidence of acute fracture. DRUJ, radiocarpal, and base of thumb osteoarthritis. Chronic deformity of the distal ulna at the DRUJ. Electronically Signed   By: Maurine Simmering M.D.   On: 02/24/2022 15:58    PROCEDURES:  Critical Care performed: No  Procedures   MEDICATIONS ORDERED IN ED: Medications - No data to display   IMPRESSION / MDM / [place] / ED COURSE  I reviewed the triage vital signs and the nursing notes.                              Differential diagnosis includes, but is not limited to, wrist fracture, wrist contusion, wrist sprain, wrist fracture, pneumothorax   Patient's presentation is most consistent with acute presentation with potential threat to life or bodily function.   Patient's diagnosis is consistent with fall, multiple contusions.  Patient presented to the ED following a fall.  She was complaining of wrist pain, rib pain.  Imaging was reassuring with no acute traumatic findings.  Patient did not hit her head or lose consciousness.  No further work-up deemed necessary at this time.  Tylenol Motrin at home for pain.  Follow-up primary care or orthopedics as needed..  Patient is given ED precautions to return to the ED for any worsening or new symptoms.        FINAL CLINICAL IMPRESSION(S) / ED DIAGNOSES   Final diagnoses:  Fall, initial encounter  Multiple contusions     Rx / DC Orders   ED Discharge Orders     None        Note:  This document was prepared using Dragon voice recognition software and may include unintentional dictation errors.   Brynda Peon 02/25/22 Maceo Pro, MD 02/25/22 [phone]

## 2022-02-24 NOTE — ED Notes (Signed)
Pt coming from medical mall today after a mechanical fall while leaving the hospital. Pt endorsing pain in R chest/back and R wrist at this time. Pt denies any other concerns.

## 2022-02-28 ENCOUNTER — Ambulatory Visit: Payer: 59 | Admitting: Cardiology

## 2022-02-28 ENCOUNTER — Encounter: Payer: 59 | Admitting: Dermatology

## 2022-03-01 ENCOUNTER — Other Ambulatory Visit: Payer: Self-pay | Admitting: Cardiovascular Disease

## 2022-03-02 NOTE — Telephone Encounter (Signed)
Refill sent to pharmacy.   

## 2022-03-03 ENCOUNTER — Emergency Department: Payer: Medicare Other

## 2022-03-03 ENCOUNTER — Encounter: Payer: Self-pay | Admitting: Emergency Medicine

## 2022-03-03 ENCOUNTER — Emergency Department
Admission: EM | Admit: 2022-03-03 | Discharge: 2022-03-03 | Disposition: A | Discharge status: Home / Self Care | Payer: Medicare Other | Attending: Emergency Medicine | Admitting: Emergency Medicine

## 2022-03-03 ENCOUNTER — Other Ambulatory Visit: Payer: Self-pay

## 2022-03-03 DIAGNOSIS — W19XXXA Unspecified fall, initial encounter: Secondary | ICD-10-CM | POA: Insufficient documentation

## 2022-03-03 DIAGNOSIS — S2231XA Fracture of one rib, right side, initial encounter for closed fracture: Secondary | ICD-10-CM | POA: Insufficient documentation

## 2022-03-03 NOTE — ED Provider Notes (Signed)
[facility] Provider Note    Event Date/Time   First MD Initiated Contact with Patient 03/03/22 1232     (approximate)   History   Chief Complaint Fall   HPI Audrey Martinez is a 76 y.o. female, lipidemia, SVT, atrial flutter, obesity, presents to the emergency department for evaluation of right arm/shoulder pain/numbness.  She states that she fell on her right side last Thursday afternoon.  She was evaluated here in the emergency department where she received a chest x-ray.  She was told that there was nothing on the x-ray, however when she viewed the images at home she saw that there was a dislocation and a rib fracture.  Since then, she has had persistent pain in her right upper arm and right-sided chest associated with intermittent episodes of numbness.  Denies fever/chills, chest pain, shortness of breath, abdominal pain, flank pain, nausea/vomiting, diarrhea, dysuria, headache, cold sensation in the affected extremity, or dizziness/lightheadedness.  History Limitations: No limitations.        Physical Exam  Triage Vital Signs: ED Triage Vitals  Enc Vitals Group     BP 03/03/22 1159 119/80     Pulse Rate 03/03/22 1159 82     Resp 03/03/22 1159 18     Temp 03/03/22 1159 98.4 F (36.9 C)     Temp Source 03/03/22 1159 Oral     SpO2 03/03/22 1159 95 %     Weight 03/03/22 1200 239 lb 3.2 oz (108.5 kg)     Height 03/03/22 1200 '5\' 7"'$  (1.702 m)     Head Circumference --      Peak Flow --      Pain Score 03/03/22 1200 8     Pain Loc --      Pain Edu? --      Excl. in [place]? --     Most recent vital signs: Vitals:   03/03/22 1159  BP: 119/80  Pulse: 82  Resp: 18  Temp: 98.4 F (36.9 C)  SpO2: 95%    General: Awake, NAD.  Skin: Warm, dry. No rashes or lesions.  Eyes: PERRL. Conjunctivae normal.  CV: Good peripheral perfusion.  Resp: Normal effort.  Abd: Soft, non-tender. No distention.  Neuro: At baseline. No gross neurological  deficits.  Musculoskeletal: Normal ROM of all extremities.  Focused Exam: No gross deformities to the right upper extremity.  Mild tenderness appreciated in the right shoulder/proximal humerus.  He maintains full range of motion at the shoulder and elbow joints.  PMS intact distally.  Normal grip strength.  Physical Exam    ED Results / Procedures / Treatments  Labs (all labs ordered are listed, but only abnormal results are displayed) Labs Reviewed - No data to display   EKG N/A.    RADIOLOGY  ED Provider Interpretation: I personally reviewed and interpreted these x-rays, chest x-ray shows subtle nondisplaced right fracture.  Shoulder x-ray negative.  DG Shoulder Right  Result Date: 03/03/2022 CLINICAL DATA:  History of shoulder dislocation EXAM: RIGHT SHOULDER - 2+ VIEW COMPARISON:  01/22/2021 FINDINGS: Reverse right shoulder arthroplasty with long stem humeral component and proximal cerclage wire. Arthroplasty components are in their expected alignment without dislocation. No periprosthetic lucency or fracture is seen. There appears to be a subtle nondisplaced fracture involving the lateral right fourth rib. Soft tissues appear within normal limits. IMPRESSION: 1. Reverse right shoulder arthroplasty without evidence of hardware complication. 2. There appears to be a subtle nondisplaced fracture involving the lateral right  fourth rib. Electronically Signed   By: Davina Poke D.O.   On: 03/03/2022 14:24   DG Chest 2 View  Result Date: 03/03/2022 CLINICAL DATA:  Right-sided chest pain.  Fall last week. EXAM: CHEST - 2 VIEW COMPARISON:  Chest radiographs 02/24/2022 FINDINGS: The cardiomediastinal silhouette is unchanged with normal heart size. There is unchanged elevation of the right hemidiaphragm. Mild streaky opacities in the lung bases are favored to reflect atelectasis. No pleural effusion or pneumothorax is identified. A right shoulder arthroplasty is noted. IMPRESSION: Mild  bibasilar atelectasis. Electronically Signed   By: Logan Bores M.D.   On: 03/03/2022 14:22    PROCEDURES:  Critical Care performed: N/A.  Procedures    MEDICATIONS ORDERED IN ED: Medications - No data to display   IMPRESSION / MDM / [place] / ED COURSE  I reviewed the triage vital signs and the nursing notes.                              Differential diagnosis includes, but is not limited to, shoulder dislocation/subluxation, rib fractures, shoulder strain, rotator cuff injury, humerus fracture.  Assessment/Plan Patient presents with persistent right shoulder/arm pain/tingling and right-sided rib pain following mechanical fall approximately 1 week ago.  Her x-rays do show a subtle nondisplaced rib fracture on the right side, but does not show any evidence of dislocation/subluxation or acute abnormalities in the right shoulder/humerus.  Physical exam shows some mild tenderness, but otherwise unremarkable.  She states that her pain is well controlled at home.  She does not have any gross neurological deficits.  She maintains normal motor function of the right upper extremity.  We will provide her with a referral to orthopedics if her symptoms do not improve after 1 week.  Offered analgesics, however she states that she does not feel like this is necessary.  Will discharge.  Provided the patient with anticipatory guidance, return precautions, and educational material. Encouraged the patient to return to the emergency department at any time if they begin to experience any new or worsening symptoms. Patient expressed understanding and agreed with the plan.   Patient's presentation is most consistent with acute complicated illness / injury requiring diagnostic workup.       FINAL CLINICAL IMPRESSION(S) / ED DIAGNOSES   Final diagnoses:  Fall, initial encounter  Closed fracture of one rib of right side, initial encounter     Rx / DC Orders   ED Discharge Orders      None        Note:  This document was prepared using Dragon voice recognition software and may include unintentional dictation errors.   Teodoro Spray, [place] 03/03/22 1452    Blake Divine, MD 03/03/22 [phone]

## 2022-03-03 NOTE — Discharge Instructions (Signed)
-  You may continue to treat your pain with Tylenol/ibuprofen as needed.  Be sure to continue taking full breaths throughout the day to prevent pneumonia.  -I suspect that the numbness will begin to subside once the inflammation has been controlled.  If it continues to persist, please follow-up with the orthopedic surgeon listed in these instructions.  You may call them at any time schedule appointment.  -Return to the emergency department anytime if you begin to experience any new or worsening symptoms.

## 2022-03-03 NOTE — ED Triage Notes (Signed)
Pt here with a fall on last Thursday afternoon. Pt states she has numbness in her right arm going up to her shoulder. Pt states she also has a slight dislocation in her right shoulder that she was not told about.

## 2022-03-06 ENCOUNTER — Other Ambulatory Visit: Payer: Self-pay | Admitting: Internal Medicine

## 2022-03-07 NOTE — Telephone Encounter (Signed)
Requested medications are due for refill today.  unsure  Requested medications are on the active medications list.  yes  Last refill. 02/13/2022 #270 0 rf  Future visit scheduled.   yes  Notes to clinic.  RX written by Tina Lai.    Requested Prescriptions  Pending Prescriptions Disp Refills   baclofen (LIORESAL) 10 MG tablet [Pharmacy Med Name: BACLOFEN 10 MG TABLET] 270 tablet 0    Sig: TAKE 1 TABLET BY MOUTH THREE TIMES A DAY     Analgesics:  Muscle Relaxants - baclofen Failed - 03/06/2022  3:24 PM      Failed - Cr in normal range and within 180 days    Creatinine, Ser  Date Value Ref Range Status  02/24/2022 1.32 (H) 0.44 - 1.00 mg/dL Final         Failed - Valid encounter within last 6 months    Recent Outpatient Visits           8 months ago Essential hypertension   [facility] Berglund, Laura H, MD   1 year ago Essential hypertension   [facility] Berglund, Laura H, MD   1 year ago Diarrhea, unspecified type   [facility] Berglund, Laura H, MD   2 years ago Essential hypertension   [facility] Berglund, Laura H, MD   2 years ago Pain of right thigh   [facility] Berglund, Laura H, MD       Future Appointments             In 1 month Dunn, Ryan M, PA-C [facility] [place]   In 3 months Berglund, Laura H, MD [facility]   In 4 months Gollan, Timothy J, MD  [facility] [place]            Passed - eGFR is 30 or above and within 180 days    GFR calc Af Amer  Date Value Ref Range Status  02/01/2020 >60 >60 mL/min Final   GFR, Estimated  Date Value Ref  Range Status  02/24/2022 42 (L) >60 mL/min Final    Comment:    (NOTE) Calculated using the CKD-EPI Creatinine Equation (2021)    eGFR  Date Value Ref Range Status  06/22/2021 61 >59 mL/min/1.73 Final           

## 2022-03-09 DIAGNOSIS — Z9889 Other specified postprocedural states: Secondary | ICD-10-CM | POA: Diagnosis not present

## 2022-03-09 DIAGNOSIS — R0781 Pleurodynia: Secondary | ICD-10-CM | POA: Diagnosis not present

## 2022-03-09 DIAGNOSIS — S2231XA Fracture of one rib, right side, initial encounter for closed fracture: Secondary | ICD-10-CM | POA: Diagnosis not present

## 2022-03-10 DIAGNOSIS — R490 Dysphonia: Secondary | ICD-10-CM | POA: Diagnosis not present

## 2022-03-10 DIAGNOSIS — J300 Vasomotor rhinitis: Secondary | ICD-10-CM | POA: Diagnosis not present

## 2022-03-10 DIAGNOSIS — K219 Gastro-esophageal reflux disease without esophagitis: Secondary | ICD-10-CM | POA: Diagnosis not present

## 2022-03-15 ENCOUNTER — Emergency Department: Payer: Medicare Other

## 2022-03-15 ENCOUNTER — Other Ambulatory Visit: Payer: Self-pay

## 2022-03-15 ENCOUNTER — Emergency Department
Admission: EM | Admit: 2022-03-15 | Discharge: 2022-03-15 | Discharge status: Against Medical Advice | Payer: Medicare Other | Attending: Emergency Medicine | Admitting: Emergency Medicine

## 2022-03-15 DIAGNOSIS — R208 Other disturbances of skin sensation: Secondary | ICD-10-CM | POA: Diagnosis not present

## 2022-03-15 DIAGNOSIS — Z5321 Procedure and treatment not carried out due to patient leaving prior to being seen by health care provider: Secondary | ICD-10-CM | POA: Diagnosis not present

## 2022-03-15 DIAGNOSIS — R9431 Abnormal electrocardiogram [ECG] [EKG]: Secondary | ICD-10-CM | POA: Diagnosis not present

## 2022-03-15 DIAGNOSIS — H539 Unspecified visual disturbance: Secondary | ICD-10-CM | POA: Diagnosis not present

## 2022-03-15 DIAGNOSIS — Z8673 Personal history of transient ischemic attack (TIA), and cerebral infarction without residual deficits: Secondary | ICD-10-CM | POA: Diagnosis not present

## 2022-03-15 DIAGNOSIS — R519 Headache, unspecified: Secondary | ICD-10-CM | POA: Diagnosis not present

## 2022-03-15 LAB — COMPREHENSIVE METABOLIC PANEL
ALT: 19 U/L (ref 0–44)
AST: 23 U/L (ref 15–41)
Albumin: 4.1 g/dL (ref 3.5–5.0)
Alkaline Phosphatase: 71 U/L (ref 38–126)
Anion gap: 4 — ABNORMAL LOW (ref 5–15)
BUN: 25 mg/dL — ABNORMAL HIGH (ref 8–23)
CO2: 27 mmol/L (ref 22–32)
Calcium: 9.3 mg/dL (ref 8.9–10.3)
Chloride: 111 mmol/L (ref 98–111)
Creatinine, Ser: 1.29 mg/dL — ABNORMAL HIGH (ref 0.44–1.00)
GFR, Estimated: 44 mL/min — ABNORMAL LOW (ref >60)
Glucose, Bld: 108 mg/dL — ABNORMAL HIGH (ref 70–99)
Potassium: 4.3 mmol/L (ref 3.5–5.1)
Sodium: 142 mmol/L (ref 135–145)
Total Bilirubin: 0.9 mg/dL (ref 0.3–1.2)
Total Protein: 6.6 g/dL (ref 6.5–8.1)

## 2022-03-15 LAB — CBC
HCT: 44.4 % (ref 36.0–46.0)
Hemoglobin: 14.3 g/dL (ref 12.0–15.0)
MCH: 30 pg (ref 26.0–34.0)
MCHC: 32.2 g/dL (ref 30.0–36.0)
MCV: 93.3 fL (ref 80.0–100.0)
Platelets: 196 10*3/uL (ref 150–400)
RBC: 4.76 MIL/uL (ref 3.87–5.11)
RDW: 11.7 % (ref 11.5–15.5)
WBC: 5.7 10*3/uL (ref 4.0–10.5)
nRBC: 0 % (ref 0.0–0.2)

## 2022-03-15 LAB — PROTIME-INR
INR: 1 (ref 0.8–1.2)
Prothrombin Time: 12.9 seconds (ref 11.4–15.2)

## 2022-03-15 LAB — APTT: aPTT: 28 seconds (ref 24–36)

## 2022-03-15 LAB — DIFFERENTIAL
Abs Immature Granulocytes: 0.01 10*3/uL (ref 0.00–0.07)
Basophils Absolute: 0 10*3/uL (ref 0.0–0.1)
Basophils Relative: 0 %
Eosinophils Absolute: 0.2 10*3/uL (ref 0.0–0.5)
Eosinophils Relative: 3 %
Immature Granulocytes: 0 %
Lymphocytes Relative: 31 %
Lymphs Abs: 1.7 10*3/uL (ref 0.7–4.0)
Monocytes Absolute: 0.6 10*3/uL (ref 0.1–1.0)
Monocytes Relative: 11 %
Neutro Abs: 3.1 10*3/uL (ref 1.7–7.7)
Neutrophils Relative %: 55 %

## 2022-03-15 LAB — ETHANOL: Alcohol, Ethyl (B): <10 mg/dL (ref <10)

## 2022-03-15 MED ORDER — SODIUM CHLORIDE 0.9% FLUSH: 3.0000 mL | Freq: Once | INTRAVENOUS | Status: DC

## 2022-03-15 NOTE — ED Triage Notes (Signed)
Pt arrives with c/o right side facial fullness that started around 16:30. Pt also endorses flashing lights in her vision that is intermittent. Pt denies focal weakness, slurred speech, facial droop, or confusion. Pt endorses sensation difference on the right side.

## 2022-03-15 NOTE — ED Provider Triage Note (Signed)
Emergency Medicine Provider Triage Evaluation Note  Audrey Martinez , a 76 y.o. female with a history of CVA, was evaluated in triage.  Pt complains of right eye visual disturbance described as flashing lights that are now resolved.  Symptoms occurred about 630 this afternoon.  Patient also noted some fullness and pressure to the right side of her face in the periorbital region.  She denies any outright facial weakness, slurred speech, facial droop, or confusion.  Also denies any distal paresthesias or headache.  Patient has a history of bilateral lens replacement.  Review of Systems  Positive: Visual disturbance Negative: Slurred speech, facial droop, or confusion  Physical Exam  BP 139/85   Pulse 81   Temp 98.5 F (36.9 C) (Oral)   Resp 16   Wt 108.4 kg   SpO2 93%   BMI 37.43 kg/m  Gen:   Awake, no distress  NAD Resp:  Normal effort CTA MSK:   Moves extremities without difficulty  CVS:  RRR  Medical Decision Making  Medically screening exam initiated at 6:27 PM.  Appropriate orders placed.  Infantof Rogue Pautler was informed that the remainder of the evaluation will be completed by another provider, this initial triage assessment does not replace that evaluation, and the importance of remaining in the ED until their evaluation is complete.  Geriatric patient to the ED for evaluation of now resolved visual disturbance described as flashing light in her right eye visual field.   Melvenia Needles, PA-C 03/15/22 1841

## 2022-03-28 ENCOUNTER — Ambulatory Visit: Payer: Self-pay | Admitting: *Deleted

## 2022-03-28 ENCOUNTER — Ambulatory Visit (INDEPENDENT_AMBULATORY_CARE_PROVIDER_SITE_OTHER): Payer: Medicare Other | Admitting: Internal Medicine

## 2022-03-28 ENCOUNTER — Encounter: Payer: Self-pay | Admitting: Internal Medicine

## 2022-03-28 VITALS — BP 128/78 | HR 88 | Ht 67.0 in | Wt 235.0 lb

## 2022-03-28 DIAGNOSIS — S2231XA Fracture of one rib, right side, initial encounter for closed fracture: Secondary | ICD-10-CM | POA: Diagnosis not present

## 2022-03-28 DIAGNOSIS — K921 Melena: Secondary | ICD-10-CM | POA: Diagnosis not present

## 2022-03-28 NOTE — Telephone Encounter (Signed)
  Chief Complaint: black stools for 5 days Symptoms: sudden black stools Frequency: everyday Pertinent Negatives: Patient denies diarrhea, fever Disposition: '[]'$ ED /'[]'$ Urgent Care (no appt availability in office) / '[x]'$ Appointment(In office/virtual)/ '[]'$  [facility]/ '[]'$ Home Care/ '[]'$ Refused Recommended Disposition /'[]'$ [facility]/ '[]'$  Follow-up with PCP Additional Notes: black solid stools for 5  days concerned for intestinal bleeding, appt this morning, home care discussed.   Reason for Disposition  MODERATE rectal bleeding (small blood clots, passing blood without stool, or toilet water turns red)  Answer Assessment - Initial Assessment Questions 1. APPEARANCE of BLOOD: "What color is it?" "Is it passed separately, on the surface of the stool, or mixed in with the stool?"      Formed and black 2. AMOUNT: "How much blood was passed?"      Stools black for 5 days 3. FREQUENCY: "How many times has blood been passed with the stools?"      Everyday once for 5 days  4. ONSET: "When was the blood first seen in the stools?" (Days or weeks)      5 days ago it started 5. DIARRHEA: "Is there also some diarrhea?" If Yes, ask: "How many diarrhea stools in the past 24 hours?"      No diarrhea 6. CONSTIPATION: "Do you have constipation?" If Yes, ask: "How bad is it?"     no 7. RECURRENT SYMPTOMS: "Have you had blood in your stools before?" If Yes, ask: "When was the last time?" and "What happened that time?"      no 8. BLOOD THINNERS: "Do you take any blood thinners?" (e.g., Coumadin/warfarin, Pradaxa/dabigatran, aspirin)     no 9. OTHER SYMPTOMS: "Do you have any other symptoms?"  (e.g., abdomen pain, vomiting, dizziness, fever)     no 10. PREGNANCY: "Is there any chance you are pregnant?" "When was your last menstrual period?"       no  Protocols used: Rectal Bleeding-A-AH

## 2022-03-28 NOTE — Progress Notes (Signed)
Date:  03/28/2022   Name:  Audrey Martinez   DOB:  31-Oct-1945   MRN:  [id number]   Chief Complaint: Melena (X 5 days, had a fall and broke 2 ribs X3 weeks, no iron supplements, started with diarrhea for 5 days pt took pepto and then stool turn dark, no blood when using the bathroom, no abdominal pain )  HPI Black stools - for the past 5 days stools have been black with normal consistency.  No tarry, no blood either fresh or dark red.    She has some diarrhea about 2 weeks ago and took [employer] for 5 days twice a day.  Last dose about 7 days ago.  No weakness, vomiting, excess use of nsaids.  Lab Results  Component Value Date   NA 142 03/15/2022   K 4.3 03/15/2022   CO2 27 03/15/2022   GLUCOSE 108 (H) 03/15/2022   BUN 25 (H) 03/15/2022   CREATININE 1.29 (H) 03/15/2022   CALCIUM 9.3 03/15/2022   EGFR 61 06/22/2021   GFRNONAA 44 (L) 03/15/2022   Lab Results  Component Value Date   CHOL 226 (H) 06/22/2021   HDL 66 06/22/2021   LDLCALC 136 (H) 06/22/2021   TRIG 136 06/22/2021   CHOLHDL 3.4 06/22/2021   Lab Results  Component Value Date   TSH 0.552 01/27/2020   No results found for: "HGBA1C" Lab Results  Component Value Date   WBC 5.7 03/15/2022   HGB 14.3 03/15/2022   HCT 44.4 03/15/2022   MCV 93.3 03/15/2022   PLT 196 03/15/2022   Lab Results  Component Value Date   ALT 19 03/15/2022   AST 23 03/15/2022   ALKPHOS 71 03/15/2022   BILITOT 0.9 03/15/2022   Lab Results  Component Value Date   VD25OH 34.76 11/25/2019     Review of Systems  Constitutional:  Negative for fatigue and fever.  Respiratory:  Negative for chest tightness and shortness of breath.   Cardiovascular:  Positive for chest pain (mild right sided rib pain improving). Negative for palpitations.  Gastrointestinal:  Negative for abdominal distention, abdominal pain, blood in stool, nausea and vomiting.  Neurological:  Negative for dizziness and headaches.  Psychiatric/Behavioral:   Negative for dysphoric mood and sleep disturbance. The patient is not nervous/anxious.     Patient Active Problem List   Diagnosis Date Noted   Hypotension 02/12/2022   Melanoma in situ of neck ([place]) 06/22/2021   Trochanteric bursitis of right hip 05/18/2021   Hx of colonic polyps    Atrial flutter ([place]) 01/31/2020   Obesity (BMI 30-39.9) 01/31/2020   Left lower lobe pulmonary nodule 01/29/2020   Dilated cardiomyopathy ([place]) 05/02/2019   Amaurosis fugax of right eye 01/13/2019   Renal insufficiency 11/08/2017   Lumbar radiculopathy 05/18/2017   Lumbar degenerative disc disease 05/18/2017   Spinal stenosis of lumbar region without neurogenic claudication 05/18/2017   Atrial tachycardia 04/14/2017   SVT (supraventricular tachycardia) 04/14/2017   Essential hypertension 04/14/2017   Intermittent asthma without complication 79/39/0300   Osteoporosis 03/21/2017   Benign essential tremor 03/21/2017   Hx of atrioventricular node ablation 02/01/2008   Hyperlipidemia 05/12/2003   Personal history of transient ischemic attack (TIA), and cerebral infarction without residual deficits 04/22/2003    Allergies  Allergen Reactions   Amoxicillin Hives, Shortness Of Breath and Swelling    Did it involve swelling of the face/tongue/throat, SOB, or low BP? Yes Did it involve sudden or severe rash/hives, skin peeling, or any  reaction on the inside of your mouth or nose? Yes Did you need to seek medical attention at a hospital or doctor's office? Yes When did it last happen? 1998      If all above answers are "NO", may proceed with cephalosporin use.    Aspirin Anaphylaxis and Shortness Of Breath   Bacitracin-Polymyx-Neo-Hc [Bacitra-Neomycin-Polymyxin-Hc] Rash   Ciprofloxacin Rash   Ciprofloxacin-Dexamethasone Anaphylaxis   Crestor [Rosuvastatin] Other (See Comments)    Caused problems with memory.   Erythromycin Hives and Rash   Hydrochlorothiazide Other (See Comments) and Palpitations     Raises her blood pressure.    Ibuprofen Other (See Comments)    Sharp stomach pains   Morphine And Related Nausea And Vomiting   Morpholine Salicylate Nausea Only   Omeprazole Palpitations and Other (See Comments)    Tachycardia and same sensation as her SVTs per patient   Doxycycline Hyclate Hives   Keflex [Cephalexin] Hives   Lovastatin Other (See Comments)    "Unable to function"   Simvastatin Other (See Comments)    "Unable to function"   Neomycin-Bacitracin Zn-Polymyx Hives and Rash   Tape Hives and Rash    Past Surgical History:  Procedure Laterality Date   BACK SURGERY  2017   L2-4 laminectomy and foraminal stenosis   BREAST CYST ASPIRATION Left [place]  2009   [facility] in [place]  2009   CATARACT EXTRACTION, BILATERAL  2010   COLONOSCOPY WITH PROPOFOL N/A 03/21/2018   Procedure: COLONOSCOPY WITH Biopsy;  Surgeon: Lin Landsman, MD;  Location: [facility];  Service: Endoscopy;  Laterality: N/A;   COLONOSCOPY WITH PROPOFOL N/A 05/03/2021   Procedure: COLONOSCOPY WITH PROPOFOL;  Surgeon: Lin Landsman, MD;  Location[facility] ENDOSCOPY;  Service: Gastroenterology;  Laterality: N/A;   CORONARY ANGIOPLASTY     derrick procedure  1977-78   bilateral    ESOPHAGOGASTRODUODENOSCOPY  2013   gastritis; done for complaint of dysphagia   ESOPHAGOGASTRODUODENOSCOPY (EGD) WITH PROPOFOL N/A 03/21/2018   Procedure: ESOPHAGOGASTRODUODENOSCOPY (EGD) WITH Biopsies;  Surgeon: Lin Landsman, MD;  Location: [facility];  Service: Endoscopy;  Laterality: N/A;  KEEP THIS PATIENT FIRST   JOINT REPLACEMENT     LEFT HEART CATH AND CORONARY ANGIOGRAPHY Left 05/02/2019   Procedure: LEFT HEART CATH AND CORONARY ANGIOGRAPHY;  Surgeon: Minna Merritts, MD;  Location: [place] [facility];  Service: Cardiovascular;  Laterality: Left;   lymph node removal     neck   MOHS  SURGERY  07/2016   BCCA nose   POLYPECTOMY N/A 03/21/2018   Procedure: POLYPECTOMY;  Surgeon: Lin Landsman, MD;  Location: [facility];  Service: Endoscopy;  Laterality: N/A;   REPLACEMENT TOTAL KNEE  2013   RT   TOTAL SHOULDER REPLACEMENT  2012   RT     Social History   Tobacco Use   Smoking status: Never   Smokeless tobacco: Never  Vaping Use   Vaping Use: Never used  Substance Use Topics   Alcohol use: Not Currently    Comment: may drink 1-2x/yr   Drug use: No     Medication list has been reviewed and updated.  Current Meds  Medication Sig   B Complex-C (B-COMPLEX WITH VITAMIN C) tablet Take 1 tablet by mouth daily.    baclofen (LIORESAL) 10 MG tablet Take 1 tablet (10 mg total) by mouth 2 (two) times daily. Home med.   Bioflavonoid  Products (ESTER-C) TABS Take 1 tablet by mouth 2 (two) times daily.    calcium carbonate (OS-CAL - DOSED IN MG OF ELEMENTAL CALCIUM) 1250 (500 Ca) MG tablet Take 1 tablet by mouth.   carvedilol (COREG) 12.5 MG tablet Take 1 tablet (12.5 mg total) by mouth 2 (two) times daily with a meal.   cetirizine (ZYRTEC) 10 MG tablet Take 10 mg by mouth as needed.   cholecalciferol (VITAMIN D) 25 MCG (1000 UT) tablet Take 1,000 Units by mouth daily.   Cranberry 1000 MG CAPS Take 1,000 mg by mouth daily.    empagliflozin (JARDIANCE) 10 MG TABS tablet Hold until followup with cardiology due to hypotension, dehydration and acute kidney injury.   ezetimibe (ZETIA) 10 MG tablet TAKE 1 TABLET BY MOUTH EVERY DAY   famotidine (PEPCID) 20 MG tablet Take 20 mg by mouth as needed.   fluticasone (FLOVENT HFA) 110 MCG/ACT inhaler INHALE 1 PUFFS INTO THE LUNGS TWICE A DAY.  Home med.   gabapentin (NEURONTIN) 300 MG capsule Take 1 capsule (300 mg total) by mouth 2 (two) times daily. Home med.   Ginger, Zingiber officinalis, (GINGER PO) Take 1,100 mg by mouth daily.    HYDROcodone-acetaminophen (NORCO/VICODIN) 5-325 MG tablet Take 1 tablet by mouth every 6  (six) hours as needed.   ipratropium (ATROVENT) 0.03 % nasal spray SMARTSIG:1-2 Spray(s) Both Nares 3 Times Daily   meloxicam (MOBIC) 15 MG tablet TAKE 1 TABLET BY MOUTH EVERY DAY AS NEEDED FOR PAIN   Misc Natural Products (TART CHERRY ADVANCED PO) Take 1 capsule by mouth daily.    montelukast (SINGULAIR) 10 MG tablet Take 10 mg by mouth every evening.   Multiple Vitamins-Minerals (MULTIVITAMIN WITH MINERALS) tablet Take 1 tablet by mouth daily. Centrum Silver   Olodaterol HCl (STRIVERDI RESPIMAT) 2.5 MCG/ACT AERS Inhale 2 puffs into the lungs daily. USE AT THE SAME TIME EVERYDAY   Probiotic Product (PROBIOTIC DAILY PO) Take 1 capsule by mouth 2 (two) times a week.    rivaroxaban (XARELTO) 20 MG TABS tablet Take 1 tablet (20 mg total) by mouth as needed (PRN for long car or plane ride).   sacubitril-valsartan (ENTRESTO) 24-26 MG TAKE 1 TABLET BY MOUTH TWICE A DAY   spironolactone (ALDACTONE) 25 MG tablet Hold until followup with cardiology due to hypotension, dehydration and acute kidney injury.   Turmeric 500 MG CAPS Take 500 mg by mouth daily.    zinc sulfate 220 (50 Zn) MG capsule Take 1 capsule (220 mg total) by mouth daily.       03/28/2022   10:39 AM 06/22/2021    8:25 AM 05/06/2020   10:01 AM 11/25/2019   10:29 AM  GAD 7 : Generalized Anxiety Score  Nervous, Anxious, on Edge 0 0 0 0  Control/stop worrying 0 0 0 0  Worry too much - different things 0 0 0 0  Trouble relaxing 0 0 0 0  Restless 0 0 0 0  Easily annoyed or irritable 0 0 0 0  Afraid - awful might happen 0 0 0 0  Total GAD 7 Score 0 0 0 0  Anxiety Difficulty Not difficult at all Not difficult at all  Not difficult at all       03/28/2022   10:39 AM 06/22/2021    8:25 AM 04/07/2021    8:35 AM  Depression screen PHQ 2/9  Decreased Interest 0 0 0  Down, Depressed, Hopeless 0 0 0  PHQ - 2 Score 0 0 0  Altered sleeping 0 0   Tired, decreased energy 0 0   Change in appetite 0 0   Feeling bad or failure about yourself   0 0   Trouble concentrating 2 0   Moving slowly or fidgety/restless 0 0   Suicidal thoughts 0 0   PHQ-9 Score 2 0   Difficult doing work/chores Somewhat difficult Not difficult at all     BP Readings from Last 3 Encounters:  03/28/22 128/78  03/03/22 119/80  02/24/22 118/78    Physical Exam Vitals and nursing note reviewed.  Constitutional:      General: She is not in acute distress.    Appearance: Normal appearance. She is well-developed.  HENT:     Head: Normocephalic and atraumatic.  Cardiovascular:     Rate and Rhythm: Normal rate and regular rhythm.     Pulses: Normal pulses.     Heart sounds: No murmur heard. Pulmonary:     Effort: Pulmonary effort is normal. No respiratory distress.     Breath sounds: No wheezing or rhonchi.  Abdominal:     General: Bowel sounds are normal.     Palpations: Abdomen is soft.     Tenderness: There is no abdominal tenderness.  Musculoskeletal:     Cervical back: Normal range of motion.  Skin:    General: Skin is warm and dry.     Findings: No rash.  Neurological:     Mental Status: She is alert and oriented to person, place, and time.  Psychiatric:        Mood and Affect: Mood normal.        Behavior: Behavior normal.     Wt Readings from Last 3 Encounters:  03/28/22 235 lb (106.6 kg)  03/03/22 239 lb 3.2 oz (108.5 kg)  02/24/22 239 lb 3.2 oz (108.5 kg)    BP 128/78   Pulse 88   Ht 5' 7" (1.702 m)   Wt 235 lb (106.6 kg)   SpO2 95%   BMI 36.81 kg/m   Assessment and Plan: 1. Black stools I suspect this is due to Pepto-Bismol. Will obtain CBC and FIT - CBC with Differential/Platelet - Fecal occult blood, imunochemical  2. Closed fracture of one rib of right side, initial encounter Slowing improving.   Partially dictated using Editor, commissioning. Any errors are unintentional.  Halina Maidens, MD [facility]  03/28/2022

## 2022-03-29 ENCOUNTER — Other Ambulatory Visit: Payer: Self-pay | Admitting: Internal Medicine

## 2022-03-29 DIAGNOSIS — K921 Melena: Secondary | ICD-10-CM | POA: Diagnosis not present

## 2022-03-29 LAB — CBC WITH DIFFERENTIAL/PLATELET
Basophils Absolute: 0 10*3/uL (ref 0.0–0.2)
Basos: 0 %
EOS (ABSOLUTE): 0.2 10*3/uL (ref 0.0–0.4)
Eos: 2 %
Hematocrit: 47.9 % — ABNORMAL HIGH (ref 34.0–46.6)
Hemoglobin: 16.1 g/dL — ABNORMAL HIGH (ref 11.1–15.9)
Immature Grans (Abs): 0 10*3/uL (ref 0.0–0.1)
Immature Granulocytes: 0 %
Lymphocytes Absolute: 2.5 10*3/uL (ref 0.7–3.1)
Lymphs: 27 %
MCH: 31 pg (ref 26.6–33.0)
MCHC: 33.6 g/dL (ref 31.5–35.7)
MCV: 92 fL (ref 79–97)
Monocytes Absolute: 0.9 10*3/uL (ref 0.1–0.9)
Monocytes: 9 %
Neutrophils Absolute: 5.9 10*3/uL (ref 1.4–7.0)
Neutrophils: 62 %
Platelets: 260 10*3/uL (ref 150–450)
RBC: 5.19 x10E6/uL (ref 3.77–5.28)
RDW: 12.4 % (ref 11.7–15.4)
WBC: 9.5 10*3/uL (ref 3.4–10.8)

## 2022-03-29 NOTE — Telephone Encounter (Signed)
Requested medication (s) are due for refill today: No  Requested medication (s) are on the active medication list: yes    Last refill: 02/13/22  #270  0 refills  Future visit scheduled yes 06/24/22  Notes to clinic:Historical provider, please review.  Requested Prescriptions  Pending Prescriptions Disp Refills   baclofen (LIORESAL) 10 MG tablet [Pharmacy Med Name: BACLOFEN 10 MG TABLET] 270 tablet 0    Sig: TAKE 1 TABLET BY MOUTH THREE TIMES A DAY     Analgesics:  Muscle Relaxants - baclofen Failed - 03/29/2022  2:05 PM      Failed - Cr in normal range and within 180 days    Creatinine, Ser  Date Value Ref Range Status  03/15/2022 1.29 (H) 0.44 - 1.00 mg/dL Final         Passed - eGFR is 30 or above and within 180 days    GFR calc Af Amer  Date Value Ref Range Status  02/01/2020 >60 >60 mL/min Final   GFR, Estimated  Date Value Ref Range Status  03/15/2022 44 (L) >60 mL/min Final    Comment:    (NOTE) Calculated using the CKD-EPI Creatinine Equation (2021)    eGFR  Date Value Ref Range Status  06/22/2021 61 >59 mL/min/1.73 Final         Passed - Valid encounter within last 6 months    Recent Outpatient Visits           Yesterday Black stools   [place] [facility], Jesse Sans, MD   9 months ago Essential hypertension   [place] [facility] at [facility], Jesse Sans, MD   1 year ago Essential hypertension   [place] [facility] at [facility], Jesse Sans, MD   2 years ago Diarrhea, unspecified type   [facility] at [facility], Jesse Sans, MD   2 years ago Essential hypertension   [place] and [facility] Medicine at [facility], Jesse Sans, MD       Future Appointments             In 1 week [place], Areta Haber, PA-C [place]. [place]    In 2 months [place], Jesse Sans, MD [facility] at [facility], [facility]   In 4 months [place], Kathlene November, MD [place]. [place]

## 2022-03-31 LAB — FECAL OCCULT BLOOD, IMMUNOCHEMICAL: Fecal Occult Bld: NEGATIVE

## 2022-04-07 NOTE — Progress Notes (Signed)
Cardiology Office Note    Date:  04/08/2022   ID:  Audrey Martinez 07/30/1945, MRN [id number]  PCP:  Audrey Hess, MD  Cardiologist:  Audrey Rogue, MD  Electrophysiologist:  None   Chief Complaint: Follow-up  History of Present Illness:   Audrey Martinez is a 76 y.o. female with history of nonobstructive CAD, atrial tachycardia/SVT/AVNRT status post ablation x2, HFrEF secondary to NICM, bilateral pulmonary emboli in 01/2020 in the setting of COVID infection, TIA, HTN, HLD intolerant to statins and Zetia, multiple medication intolerances, and orthostasis who presents for follow-up of her cardiomyopathy.   Patient previously followed by cardiology in [place].  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  No sustained atrial tach/no inducible atrial flutter.  She underwent successful radiofrequency ablation of AV nodal slow pathway.  Following this, she had recurrent symptoms shortly thereafter and had an event monitor and repeat EP study on 01/21/2008: Markedly prolonged at baseline AH interval (AV nodal function is subnormal).  No inducible tachycardia; only inducible double echo beats.  As per EP, event monitor with ectopic atrial tachycardia versus atrial flutter with 2-1 block.  Notes indicated she did not want anticoagulation.  Prior echo from 12/13/2006 showed an LVEF of 55 to 60%, no significant valvular abnormalities.  ETT on 09/28/2007 showed Bruce protocol with the patient exercising for 5-minutes and 1 second achieving 7 METS with a maximum heart rate of 179 bpm (111% MPHR).  Pretest EKG was normal with a heart rate of 96 bpm.  No chest pain noted during test.  No significant ischemia was noted.  Stress echo on 11/14/2008 showed Bruce protocol with the patient exercising for 4 minutes and 31 seconds achieving 4.6 METS and 113% MPHR.  Echocardiogram showed an LVEF of 60% with no significant ischemia.   She established  with Audrey Martinez in 2018.  She declined statin, aspirin, Plavix, and anticoagulation.  She was seen in late 2019 with tachypalpitations with outpatient cardiac monitoring showing sinus rhythm with an average heart of 87 bpm, first-degree AV block, type I second-degree AV block, 2 runs of NSVT with the fastest and longest interval lasting 16 beats, isolated PACs, and atrial triplets with rare isolated PVCs, ventricular couplets and triplets, ventricular bigeminy was noted.  Patient triggered events were not associated with significant arrhythmia.  Follow-up echo showed an EF of 45 to 50%, mild diffuse hypokinesis with select images showing possible anteroseptal and apical hypokinesis, mild aortic regurgitation, mild mitral regurgitation, normal size left atrium, normal RV systolic function, normal PASP.  Subsequent Lexiscan Myoview in 05/2018 showed no significant ischemia with a moderate size region of predominantly fixed perfusion defect of mild to moderate intensity of the mid to distal anterior and anterior septal wall unable to exclude breast attenuation artifact, EF 41%, overall low to moderate risk scan.  For vision disturbances, she underwent carotid artery ultrasound on 01/11/2019 showing minor carotid artery atherosclerosis with no hemodynamically significant internal carotid artery stenosis and antegrade vertebral artery flow bilaterally.  Zio patch in 01/2019 showed sinus rhythm with 3 episodes of NSVT with the longest episode lasting 7 beats, second-degree AV block type I, and rare atrial/ventricular ectopy.  Repeat Zio patch in 02/2021 showed 1 run of NSVT lasting 5 beats, second-degree AV block type I, and no significant arrhythmia.  Echo in 03/2019 showed an EF of 30 to 24%, grade 2 diastolic dysfunction, normal RV systolic function and ventricular cavity size, trace mitral  regurgitation, trivial tricuspid regurgitation, normal PASP, and aortic dilatation.  Given her cardiomyopathy, she underwent LHC in  04/2019 which showed no obstructive CAD.  Most recent echo from 12/2019 showed an EF of 30 to 35%, global hypokinesis, normal RV systolic function and ventricular cavity size, normal PASP, mild to moderate mitral regurgitation, and an ascending aorta measuring 3.5 cm.   She was seen in the ED in 10/2021 with jaw numbness with CT of the head showing no acute intracranial abnormality.  EKG showed sinus rhythm with a first-degree AV block.  Troponin negative.   She followed up with her primary cardiologist in 01/2022 and was without symptoms of angina or decompensation.  She was started on Jardiance, otherwise orthostasis precluded escalation of GDMT, with recommendation to continue carvedilol, Entresto, and spironolactone.   She was admitted to the hospital in 01/2022 with presyncope felt to be related to orthostatic hypotension from dehydration.  She had just stood up from getting out of bed, felt dizzy and fell backwards.  No frank syncope.  No associated palpitations, angina, or dyspnea.  Leading up to the event, she felt well and was at her baseline.  High-sensitivity troponin negative x2.  At discharge, Entresto, spironolactone, and Jardiance were held.  Initial serum creatinine 1.52 with a baseline around 1.1, improved to 1.3 prior to discharge.  She was seen in hospital follow-up on 02/24/2022 and was without symptoms of angina or decompensation.  No further dizziness or presyncope.  No changes were made at that time given orthostasis and mild renal dysfunction noted on labs.  Following venipuncture on her visit on 9/28, she suffered a mechanical fall in the hospital and was evaluated in the ED with imaging showing a subtle nondisplaced rib fracture on the right side.  She was seen again in the ED on 10/5 with right shoulder discomfort and paresthesias.  Plain films were again reviewed and felt to not show evidence of dislocation, subluxation, or other acute abnormalities of the right shoulder/humerus.   She was most recently seen in the ED on 10/17 with right-sided facial fullness.  CT head was without acute intracranial abnormality with chronic infarct of the left frontal lobe and right cerebellum unchanged.  She was seen by her PCP on 03/28/2022 noting black stools following recent Pepto administration for diarrhea.  Hemoccult negative with stable Hgb.  She comes in doing well from a cardiac perspective and is without symptoms of angina or decompensation.  No dyspnea, palpitations, presyncope, or syncope.  No further falls.  She does note some mild brief positional dizziness.  No significant lower extremity swelling or orthopnea.  Rib fracture discomfort has improved.  She is now back on spironolactone half tab daily as well as Jardiance.  Her weight is down 3 pounds by our scale.  Overall, she feels like she is doing well.   Labs independently reviewed: 02/2022 - Hgb 16.1, PLT 260, potassium 4.3, BUN 25, serum creatinine 1.29, albumin 4.1, AST/ALT normal 01/2022 -magnesium 2.4 05/2021 - TC 226, TG 136, HDL 66, LDL 136 12/2019 - TSH normal  Past Medical History:  Diagnosis Date   Actinic keratosis    Acute pulmonary embolus ([place]) 01/29/2020   Acute renal failure (ARF) ([place]) 01/31/2020   Acute respiratory failure with hypoxia ([place]) 01/29/2020   Acute septic pulmonary embolism (H[facility])    AKI (acute kidney injury) ([place])    Amaurosis fugax, right eye 12/2018   Arthritis    Asthma    Basal cell carcinoma 07/2016  central forehead (tx in [place])   Cardiomyopathy [facility])    a. 04/2018 Echo: EF 45-50%. Anteroseptal and apical HK in some views. Mild AI/MR. Nl RV fxn; b. 05/2018 MV: mid-dist ant and antsept mild defect - ? breast atten. EF 41%. No ischemia; c. 03/2019 Echo: EF 30-35%, Gr2 DD, Trace MR, triv TR. Asc Ao ectatic dil - 57m.    Coronary artery disease    Dyspnea    Dysrhythmia    GERD (gastroesophageal reflux disease)    Herniated disc, lumbar    Hyperlipidemia    Left wrist  fracture    [place]   Melanoma IS R post neck, excised 08/10/20   Osteoporosis    Pneumonia due to COVID-19 virus 01/26/2020   Pulmonary embolus ([facility] 90/05/69  Complication of [id number] 12/2019:  Xarelto 20 mg daily x 6 months then PRN   [place]   SVT (supraventricular tachycardia)    a.2008 s/p RFCA for AVNRT; b. 9 & 02/2019 Zio x 2: 1. Avg HR 89, 3 runs NSVT (longest 7 beats), Mobitz 1. 2. Avg HR 89, NSVT x 1 (5 beats), Mobitz 1. No signif arrhythmia.   Tendinitis of right knee 07/03/2017    Past Surgical History:  Procedure Laterality Date   BACK SURGERY  2017   L2-4 laminectomy and foraminal stenosis   BREAST CYST ASPIRATION Left 1977   CARDIAC CATHETERIZATION  2009   [facility] [place]   CATARACT EXTRACTION, BILATERAL  2010   COLONOSCOPY WITH PROPOFOL N/A 03/21/2018   Procedure: COLONOSCOPY WITH Biopsy;  Surgeon: VLin Landsman MD;  [facility]  Service: Endoscopy;  Laterality: N/A;   COLONOSCOPY WITH PROPOFOL N/A 05/03/2021   Procedure: COLONOSCOPY WITH PROPOFOL;  Surgeon: VLin Landsman MD;  [facility];  Service: Gastroenterology;  Laterality: N/A;   CORONARY ANGIOPLASTY     derrick procedure  1977-78   bilateral    ESOPHAGOGASTRODUODENOSCOPY  2013   gastritis; done for complaint of dysphagia   ESOPHAGOGASTRODUODENOSCOPY (EGD) WITH PROPOFOL N/A 03/21/2018   Procedure: ESOPHAGOGASTRODUODENOSCOPY (EGD) WITH Biopsies;  Surgeon: VLin Landsman MD;  [facility]  Service: Endoscopy;  Laterality: N/A;  KEEP THIS PATIENT FIRST   JOINT REPLACEMENT     LEFT HEART CATH AND CORONARY ANGIOGRAPHY Left 05/02/2019   Procedure: LEFT HEART CATH AND CORONARY ANGIOGRAPHY;  Surgeon: GMinna Merritts MD;  [facility];  Service: Cardiovascular;  Laterality: Left;   lymph node removal     neck    MOHS SURGERY  07/2016   BCCA nose   POLYPECTOMY N/A 03/21/2018   Procedure: POLYPECTOMY;  Surgeon: VLin Landsman MD;  [place] [facility]  Service: Endoscopy;  Laterality: N/A;   REPLACEMENT TOTAL KNEE  2013   RT   TOTAL SHOULDER REPLACEMENT  2012   RT     Current Medications: Current Meds  Medication Sig   albuterol (PROVENTIL HFA;VENTOLIN HFA) 108 (90 Base) MCG/ACT inhaler Inhale 1-2 puffs into the lungs every 6 (six) hours as needed for wheezing or shortness of breath.   B Complex-C (B-COMPLEX WITH VITAMIN C) tablet Take 1 tablet by mouth daily.    baclofen (LIORESAL) 10 MG tablet Take 1 tablet (10 mg total) by mouth 2 (two) times daily. Home med.   Bioflavonoid Products (ESTER-C) TABS Take 1 tablet by mouth 2 (two) times daily.    calcium carbonate (  OS-CAL - DOSED IN MG OF ELEMENTAL CALCIUM) 1250 (500 Ca) MG tablet Take 1 tablet by mouth.   carvedilol (COREG) 6.25 MG tablet Take 1 tablet (6.25 mg total) by mouth 2 (two) times daily with a meal.   cetirizine (ZYRTEC) 10 MG tablet Take 10 mg by mouth as needed.   cholecalciferol (VITAMIN D) 25 MCG (1000 UT) tablet Take 1,000 Units by mouth daily.   Cranberry 1000 MG CAPS Take 1,000 mg by mouth daily.    ezetimibe (ZETIA) 10 MG tablet TAKE 1 TABLET BY MOUTH EVERY DAY   famotidine (PEPCID) 20 MG tablet Take 20 mg by mouth as needed.   fluticasone (FLOVENT HFA) 110 MCG/ACT inhaler INHALE 1 PUFFS INTO THE LUNGS TWICE A DAY.  Home med.   gabapentin (NEURONTIN) 300 MG capsule Take 1 capsule (300 mg total) by mouth 2 (two) times daily. Home med.   Ginger, Zingiber officinalis, (GINGER PO) Take 1,100 mg by mouth daily.    HYDROcodone-acetaminophen (NORCO/VICODIN) 5-325 MG tablet Take 1 tablet by mouth every 6 (six) hours as needed.   ipratropium (ATROVENT) 0.03 % nasal spray SMARTSIG:1-2 Spray(s) Both Nares 3 Times Daily   meloxicam (MOBIC) 15 MG tablet TAKE 1 TABLET BY MOUTH EVERY DAY AS NEEDED FOR PAIN   Misc Natural  Products (TART CHERRY ADVANCED PO) Take 1 capsule by mouth daily.    montelukast (SINGULAIR) 10 MG tablet Take 10 mg by mouth every evening.   Multiple Vitamins-Minerals (MULTIVITAMIN WITH MINERALS) tablet Take 1 tablet by mouth daily. Centrum Silver   Olodaterol HCl (STRIVERDI RESPIMAT) 2.5 MCG/ACT AERS Inhale 2 puffs into the lungs daily. USE AT THE SAME TIME EVERYDAY   Probiotic Product (PROBIOTIC DAILY PO) Take 1 capsule by mouth 2 (two) times a week.    sacubitril-valsartan (ENTRESTO) 24-26 MG TAKE 1 TABLET BY MOUTH TWICE A DAY   Turmeric 500 MG CAPS Take 500 mg by mouth daily.    zinc sulfate 220 (50 Zn) MG capsule Take 1 capsule (220 mg total) by mouth daily.   [DISCONTINUED] carvedilol (COREG) 12.5 MG tablet Take 1 tablet (12.5 mg total) by mouth 2 (two) times daily with a meal.   [DISCONTINUED] empagliflozin (JARDIANCE) 10 MG TABS tablet Hold until followup with cardiology due to hypotension, dehydration and acute kidney injury.   [DISCONTINUED] spironolactone (ALDACTONE) 25 MG tablet Hold until followup with cardiology due to hypotension, dehydration and acute kidney injury.    Allergies:   Amoxicillin, Aspirin, Bacitracin-polymyx-neo-hc [bacitra-neomycin-polymyxin-hc], Ciprofloxacin, Ciprofloxacin-dexamethasone, Crestor [rosuvastatin], Erythromycin, Hydrochlorothiazide, Ibuprofen, Morphine and related, Morpholine salicylate, Omeprazole, Doxycycline hyclate, Keflex [cephalexin], Lovastatin, Simvastatin, Neomycin-bacitracin zn-polymyx, and Tape   Social History   Socioeconomic History   Marital status: Widowed    Spouse name: Not on file   Number of children: 3   Years of education: Not on file   Highest education level: Not on file  Occupational History   Occupation: retired  Tobacco Use   Smoking status: Never   Smokeless tobacco: Never  Vaping Use   Vaping Use: Never used  Substance and Sexual Activity   Alcohol use: Not Currently    Comment: may drink 1-2x/yr   Drug use:  No   Sexual activity: Never  Other Topics Concern   Not on file  Social History Narrative   ** Merged History Encounter **       Lives at home alone, has resources if help is needed   Social Determinants of Health   Financial Resource Strain: Low Risk  (04/07/2021)  Overall Financial Resource Strain (CARDIA)    Difficulty of Paying Living Expenses: Not hard at all  Food Insecurity: No Food Insecurity (02/12/2022)   Hunger Vital Sign    Worried About Running Out of Food in the Last Year: Never true    Ran Out of Food in the Last Year: Never true  Transportation Needs: No Transportation Needs (02/12/2022)   PRAPARE - Hydrologist (Medical): No    Lack of Transportation (Non-Medical): No  Physical Activity: Insufficiently Active (04/07/2021)   Exercise Vital Sign    Days of Exercise per Week: 7 days    Minutes of Exercise per Session: 20 min  Stress: No Stress Concern Present (04/06/2020)   [place]    Feeling of Stress : Not at all  Social Connections: Moderately Integrated (04/07/2021)   Social Connection and Isolation Panel [NHANES]    Frequency of Communication with Friends and Family: More than three times a week    Frequency of Social Gatherings with Friends and Family: Twice a week    Attends Religious Services: More than 4 times per year    Active Member of [employer] or Organizations: No    Attends Music therapist: More than 4 times per year    Marital Status: Widowed     Family History:  The patient's family history includes CAD (age of onset: 80) in her sister; Clotting disorder in her half-sister and half-sister; Diabetes in her mother; Stroke in her mother and sister; Stroke (age of onset: 27) in her sister; Valvular heart disease in her father. There is no history of Breast cancer.  ROS:   12-point review of systems is negative unless otherwise noted in the  HPI.   EKGs/Labs/Other Studies Reviewed:    Studies reviewed were summarized above. The additional studies were reviewed today:  2D echo 01/09/2020: 1. Left ventricular ejection fraction, by estimation, is 30 to 35%. The  left ventricle has moderately decreased function. The left ventricle  demonstrates global hypokinesis. Left ventricular diastolic parameters are  indeterminate. The average left  ventricular global longitudinal strain is -9.6 %.   2. Right ventricular systolic function is normal. The right ventricular  size is normal. There is normal pulmonary artery systolic pressure.   3. The mitral valve is normal in structure. Mild to moderate mitral valve  regurgitation.   4. The aortic valve was not well visualized. Aortic valve regurgitation  is mild.   5. Ascending aorta 3.5 cm __________   LHC 05/02/2019: Coronary dominance: Right  Left mainstem:   Large vessel that bifurcates into the LAD and left circumflex, no significant disease noted  Left anterior descending (LAD):   Large vessel that extends to the apical region, diagonal branch 2 of moderate size, no significant disease noted  Left circumflex (LCx):  Large vessel with OM branch 2, no significant disease noted  Right coronary artery (RCA):  Right dominant vessel with PL and PDA, no significant disease noted  Left ventriculography: Left ventricular systolic function is moderately depressed,  LVEF is estimated at 30 to 35%, there is no significant mitral regurgitation , no significant aortic valve stenosis  Final Conclusions:   Nonobstructive CAD, EF estimated at 30 to 35%, global hypokinesis,  LVEDP 17 to 20 mm Hg  Recommendations:  Medical management difficult secondary to numerous medical intolerances, Reports having diarrhea on low dose coreg Will discuss with her as an outpt, Unable to exclude hypertensive  cardiomyopathy, Suggested she monitor BP at home and call our office with numbers She might  benefit from lasix PRN __________   2D echo 04/17/2019: 1. Left ventricular ejection fraction, by visual estimation, is 30 to  35%. The left ventricle has moderate to severely decreased function. There  is no left ventricular hypertrophy.   2. Left ventricular diastolic parameters are consistent with Grade II  diastolic dysfunction (pseudonormalization).   3. Global right ventricle has normal systolic function.The right  ventricular size is normal. Right vetricular wall thickness was not  assessed.   4. Left atrial size was normal.   5. Right atrial size was normal.   6. The mitral valve is normal in structure. Trace mitral valve  regurgitation.   7. The tricuspid valve is grossly normal. Tricuspid valve regurgitation  is trivial.   8. The aortic valve is tricuspid. Aortic valve regurgitation is not  visualized.   9. The pulmonic valve was not well visualized. Pulmonic valve  regurgitation is not visualized.  10. Aortic dilatation noted.  11. There is ectatic dilatation of the ascending aorta measuring 38 mm.  12. Normal pulmonary artery systolic pressure. __________   Elwyn Reach patch 02/2019: Normal sinus rhythm avg HR of 89 bpm.    1 run of Ventricular Tachycardia occurred lasting 5 beats with a max rate of 174 bpm (avg 133 bpm).    Second Degree AV Block-Mobitz I (Wenckebach) was present. Isolated SVEs were rare (<1.0%), SVE Couplets were rare (<1.0%), and SVE Triplets were rare (<1.0%). Isolated VEs were rare (<1.0%), VE Couplets were rare (<1.0%), and no VE Triplets were present. Ventricular Trigeminy was present.   Patient triggered events were not associated with significant arrhythmia __________   Elwyn Reach patch 01/2019: Normal sinus rhythm avg HR of 89 bpm.   3 Ventricular Tachycardia runs occurred, the run with the fastest interval lasting 5 beats with a max rate of 200 bpm, the longest lasting 7 beats with an avg rate of 141 bpm.    Second Degree AV Block-Mobitz I  (Wenckebach) was present. Isolated SVEs were rare (<1.0%), SVE Couplets were rare (<1.0%), and SVE Triplets were rare (<1.0%). Isolated VEs were rare (<1.0%, 3119), VE Couplets were rare (<1.0%, 171), and VE Triplets were rare (<1.0%, 1). Ventricular Bigeminy and Trigeminy were present.   Patient triggered events were not associated with significant arrhythmia. __________   Carotid artery ultrasound 01/11/2019: IMPRESSION: Minor carotid atherosclerosis. No hemodynamically significant ICA stenosis. Degree of narrowing less than 50% bilaterally by ultrasound criteria.   Patent antegrade vertebral flow bilaterally __________   Carlton Adam MPI 05/31/2018: Pharmacological myocardial perfusion imaging study with no significant  Ischemia Moderate size region of predominantly fixed perfusion defect of mild to moderate intensity of the mid to distal anterior and anteroseptal wall Unable to exclude breast attenuation artifact Normal wall motion, EF estimated at 41%, Depressed ejection fraction possibly secondary to GI uptake artifact No EKG changes concerning for ischemia at peak stress or in recovery. Low to moderate risk scan __________   2D echo 05/18/2018: - Left ventricle: The cavity size was normal. Systolic function was    mildly reduced. The estimated ejection fraction was in the range    of 45% to 50%. Mild diffuse hypokinesis. Regional wall motion    abnormalities cannot be excluded. Select images with anteroseptal    and apical hypokinesis. The study is not technically sufficient    to allow evaluation of LV diastolic function.  - Aortic valve: There was mild regurgitation.  -  Mitral valve: There was mild regurgitation.  - Left atrium: The atrium was normal in size.  - Right ventricle: Systolic function was normal.  - Pulmonary arteries: Systolic pressure was within the normal    range. __________   Elwyn Reach patch 03/2018: Sinus rhythm   Avg HR of 87 bpm.  First Degree AV Block was  present.  Second Degree AV Block-Mobitz I (Wenckebach) was present.    2 Ventricular Tachycardia runs occurred,  the run with the fastest and longest interval lasting 16 beats with avg 183 bpm   Isolated SVEs were rare (<1.0%), SVE Triplets were rare (<1.0%), and no SVE Couplets were present.  Isolated VEs were rare (<1.0%, 2358), VE Couplets were rare (<1.0%, 140), and VE Triplets were rare (<1.0%, 3).  Ventricular Bigeminy was present.   Patient triggered events were not associated with significant arrhythmia   EKG:  EKG is ordered today.  The EKG ordered today demonstrates sinus tachycardia, 102 bpm, first-degree AV block, nonspecific IVCD  Recent Labs: 02/13/2022: Magnesium 2.4 03/15/2022: ALT 19; BUN 25; Creatinine, Ser 1.29; Potassium 4.3; Sodium 142 03/28/2022: Hemoglobin 16.1; Platelets 260  Recent Lipid Panel    Component Value Date/Time   CHOL 226 (H) 06/22/2021 0859   TRIG 136 06/22/2021 0859   HDL 66 06/22/2021 0859   CHOLHDL 3.4 06/22/2021 0859   CHOLHDL 2.8 01/30/2020 0547   VLDL 5 01/30/2020 0547   LDLCALC 136 (H) 06/22/2021 0859    PHYSICAL EXAM:    VS:  BP 100/80 (BP Location: Left Arm, Patient Position: Sitting, Cuff Size: Large)   Pulse (!) 102   Ht '5\' 7"'$  (1.702 m)   Wt 236 lb 4 oz (107.2 kg)   SpO2 97%   BMI 37.00 kg/m   BMI: Body mass index is 37 kg/m.  Physical Exam Vitals reviewed.  Constitutional:      Appearance: She is well-developed.  HENT:     Head: Normocephalic and atraumatic.  Eyes:     General:        Right eye: No discharge.        Left eye: No discharge.  Neck:     Vascular: No JVD.  Cardiovascular:     Rate and Rhythm: Normal rate and regular rhythm.     Heart sounds: Normal heart sounds, S1 normal and S2 normal. Heart sounds not distant. No midsystolic click and no opening snap. No murmur heard.    No friction rub.  Pulmonary:     Effort: Pulmonary effort is normal. No respiratory distress.     Breath sounds: Normal breath  sounds. No decreased breath sounds, wheezing or rales.  Chest:     Chest wall: No tenderness.  Abdominal:     General: There is no distension.  Musculoskeletal:     Cervical back: Normal range of motion.     Right lower leg: No edema.     Left lower leg: No edema.  Skin:    General: Skin is warm and dry.     Nails: There is no clubbing.  Neurological:     Mental Status: She is alert and oriented to person, place, and time.  Psychiatric:        Speech: Speech normal.        Behavior: Behavior normal.        Thought Content: Thought content normal.        Judgment: Judgment normal.     Wt Readings from Last 3 Encounters:  04/08/22 236 lb 4  oz (107.2 kg)  03/28/22 235 lb (106.6 kg)  03/03/22 239 lb 3.2 oz (108.5 kg)     ASSESSMENT & PLAN:   HFrEF secondary to NICM: She is euvolemic and well compensated.  With noted history of orthostasis and relative hypotension, we will decrease carvedilol to 6.25 mg twice daily.  Otherwise, she remains on low-dose Entresto, spironolactone, and Jardiance.  Recent labs showed stable renal function and electrolytes.  Now that she is back on GDMT, after she has been maintained on maximally tolerated dose for several months, we will look to repeat a limited echo to reevaluate her cardiomyopathy.  Should this persist, we would pursue cardiac MRI with possible referral to EP for consideration of ICD.  Presyncope: Felt to be in the setting of orthostasis with associated hypotension and dehydration.  Reduce carvedilol as outlined above.  No further episodes.  Atrial tachycardia/SVT: Quiescent.  Continue carvedilol.  TIA: No new symptoms or deficits.  Aspirin allergy.  Has previously declined clopidogrel.  Primary cardiologist has the patient on as needed Xarelto for long travel.  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  Primary cardiologist has the patient taking rivaroxaban as needed for long travel.  Defer management to  him.  HTN: Blood pressure is soft in the office today, though she is asymptomatic.  Medications as outlined above.  HLD: LDL 136 in 05/2021.  Intolerant to statins.  Remains on ezetimibe.    Disposition: F/u with Audrey Martinez or an APP in 6 months.   Medication Adjustments/Labs and Tests Ordered: Current medicines are reviewed at length with the patient today.  Concerns regarding medicines are outlined above. Medication changes, Labs and Tests ordered today are summarized above and listed in the Patient Instructions accessible in Encounters.   Signed, Christell Faith, PA-C 04/08/2022 4:30 PM     [address] [place], [place] [address] [phone]

## 2022-04-08 ENCOUNTER — Ambulatory Visit: Payer: Medicare Other | Attending: Physician Assistant | Admitting: Physician Assistant

## 2022-04-08 ENCOUNTER — Encounter: Payer: Self-pay | Admitting: Physician Assistant

## 2022-04-08 VITALS — BP 100/80 | HR 102 | Ht 67.0 in | Wt 236.2 lb

## 2022-04-08 DIAGNOSIS — Z8673 Personal history of transient ischemic attack (TIA), and cerebral infarction without residual deficits: Secondary | ICD-10-CM | POA: Insufficient documentation

## 2022-04-08 DIAGNOSIS — Z86711 Personal history of pulmonary embolism: Secondary | ICD-10-CM | POA: Diagnosis not present

## 2022-04-08 DIAGNOSIS — I428 Other cardiomyopathies: Secondary | ICD-10-CM | POA: Diagnosis not present

## 2022-04-08 DIAGNOSIS — I5020 Unspecified systolic (congestive) heart failure: Secondary | ICD-10-CM | POA: Diagnosis not present

## 2022-04-08 DIAGNOSIS — R42 Dizziness and giddiness: Secondary | ICD-10-CM | POA: Insufficient documentation

## 2022-04-08 DIAGNOSIS — I471 Supraventricular tachycardia, unspecified: Secondary | ICD-10-CM | POA: Diagnosis not present

## 2022-04-08 DIAGNOSIS — E782 Mixed hyperlipidemia: Secondary | ICD-10-CM | POA: Insufficient documentation

## 2022-04-08 DIAGNOSIS — I10 Essential (primary) hypertension: Secondary | ICD-10-CM | POA: Diagnosis not present

## 2022-04-08 MED ORDER — CARVEDILOL 6.25 MG PO TABS: 6.2500 mg | ORAL_TABLET | Freq: Two times a day (BID) | ORAL | 3 refills | Status: DC

## 2022-04-08 MED ORDER — EMPAGLIFLOZIN 10 MG PO TABS: 10.0000 mg | ORAL_TABLET | Freq: Every day | ORAL | 3 refills | Status: DC

## 2022-04-08 MED ORDER — SPIRONOLACTONE 25 MG PO TABS: 12.5000 mg | ORAL_TABLET | Freq: Every day | ORAL | 3 refills | Status: DC

## 2022-04-08 NOTE — Patient Instructions (Addendum)
Medication Instructions:  Your physician has recommended you make the following change in your medication:   DECREASE Carvedilol to 6.25 mg twice a day  *If you need a refill on your cardiac medications before your next appointment, please call your pharmacy*   Lab Work: None  If you have labs (blood work) drawn today and your tests are completely normal, you will receive your results only by: [place] (if you have MyChart) OR A paper copy in the mail If you have any lab test that is abnormal or we need to change your treatment, we will call you to review the results.   Testing/Procedures: None   Follow-Up: At [facility], you and your health needs are our priority.  As part of our continuing mission to provide you with exceptional heart care, we have created designated Provider Care Teams.  These Care Teams include your primary Cardiologist (physician) and Advanced Practice Providers (APPs -  Physician Assistants and Nurse Practitioners) who all work together to provide you with the care you need, when you need it.   Your next appointment:   6 month(s)  The format for your next appointment:   In Person  Provider:   Ida Rogue, MD or Christell Faith, PA-C        Important Information About Sugar

## 2022-04-13 DIAGNOSIS — Z961 Presence of intraocular lens: Secondary | ICD-10-CM | POA: Diagnosis not present

## 2022-04-18 ENCOUNTER — Telehealth: Payer: Self-pay | Admitting: Internal Medicine

## 2022-04-18 ENCOUNTER — Other Ambulatory Visit: Payer: Self-pay | Admitting: Cardiovascular Disease

## 2022-04-18 ENCOUNTER — Other Ambulatory Visit: Payer: Self-pay | Admitting: Internal Medicine

## 2022-04-18 DIAGNOSIS — E782 Mixed hyperlipidemia: Secondary | ICD-10-CM

## 2022-04-18 NOTE — Telephone Encounter (Signed)
We would need to wait until the new formulary has started, otherwise it would just deny due to the medication currently being covered.

## 2022-04-18 NOTE — Telephone Encounter (Signed)
Patient is aware of below message. She will call back once new formulary has started.  Nothing further needed.

## 2022-04-18 NOTE — Telephone Encounter (Signed)
Pharmacy team, can you guys assist with this? Can a PA be submitted now or does it have to be in January?

## 2022-04-19 NOTE — Telephone Encounter (Signed)
Meloxicam- 12/03/21 #90 1RF- too soon Ezetimibe- 08/25/21 #90 2RF- too soon Requested Prescriptions  Pending Prescriptions Disp Refills   meloxicam (MOBIC) 15 MG tablet [Pharmacy Med Name: MELOXICAM 15 MG TABLET] 90 tablet 1    Sig: TAKE 1 TABLET BY MOUTH EVERY DAY AS NEEDED FOR PAIN     Analgesics:  COX2 Inhibitors Failed - 04/18/2022  2:07 PM      Failed - Manual Review: Labs are only required if the patient has taken medication for more than 8 weeks.      Failed - HGB in normal range and within 360 days    Hemoglobin  Date Value Ref Range Status  03/28/2022 16.1 (H) 11.1 - 15.9 g/dL Final         Failed - Cr in normal range and within 360 days    Creatinine, Ser  Date Value Ref Range Status  03/15/2022 1.29 (H) 0.44 - 1.00 mg/dL Final         Failed - HCT in normal range and within 360 days    Hematocrit  Date Value Ref Range Status  03/28/2022 47.9 (H) 34.0 - 46.6 % Final         Passed - AST in normal range and within 360 days    AST  Date Value Ref Range Status  03/15/2022 23 15 - 41 U/L Final         Passed - ALT in normal range and within 360 days    ALT  Date Value Ref Range Status  03/15/2022 19 0 - 44 U/L Final         Passed - eGFR is 30 or above and within 360 days    GFR calc Af Amer  Date Value Ref Range Status  02/01/2020 >60 >60 mL/min Final   GFR, Estimated  Date Value Ref Range Status  03/15/2022 44 (L) >60 mL/min Final    Comment:    (NOTE) Calculated using the CKD-EPI Creatinine Equation (2021)    eGFR  Date Value Ref Range Status  06/22/2021 61 >59 mL/min/1.73 Final         Passed - Patient is not pregnant      Passed - Valid encounter within last 12 months    Recent Outpatient Visits           3 weeks ago Black stools   [place] [facility] Care and Sports Medicine at [facility], Jesse Sans, MD   10 months ago Essential hypertension   [place] Primary Care and Sports Medicine at [facility], Jesse Sans,  MD   1 year ago Essential hypertension   [place] Primary Care and Sports Medicine at [facility], Jesse Sans, MD   2 years ago Diarrhea, unspecified type   [facility] Primary Care and Sports Medicine at [facility], Jesse Sans, MD   2 years ago Essential hypertension    Primary Care and Sports Medicine at [facility], Jesse Sans, MD       Future Appointments             In 2 weeks Brendolyn Patty, MD [place]   In 2 months [place] Melia, Jesse Sans, MD [facility] Primary Care and Sports Medicine at [facility], [place]   In 5 months [place], Kathlene November, MD [place]. [facility]             baclofen (LIORESAL) 10 MG  tablet [Pharmacy Med Name: BACLOFEN 10 MG TABLET] 270 tablet 0    Sig: TAKE 1 TABLET BY MOUTH THREE TIMES A DAY     Analgesics:  Muscle Relaxants - baclofen Failed - 04/18/2022  2:07 PM      Failed - Cr in normal range and within 180 days    Creatinine, Ser  Date Value Ref Range Status  03/15/2022 1.29 (H) 0.44 - 1.00 mg/dL Final         Passed - eGFR is 30 or above and within 180 days    GFR calc Af Amer  Date Value Ref Range Status  02/01/2020 >60 >60 mL/min Final   GFR, Estimated  Date Value Ref Range Status  03/15/2022 44 (L) >60 mL/min Final    Comment:    (NOTE) Calculated using the CKD-EPI Creatinine Equation (2021)    eGFR  Date Value Ref Range Status  06/22/2021 61 >59 mL/min/1.73 Final         Passed - Valid encounter within last 6 months    Recent Outpatient Visits           3 weeks ago Black stools   [place] [facility] Care and Sports Medicine at [facility], Jesse Sans, MD   10 months ago Essential hypertension   [place] Primary Care and Sports Medicine at [facility], Jesse Sans, MD   1 year ago Essential hypertension   [place] Primary Care and Sports Medicine at [facility], Jesse Sans, MD   2  years ago Diarrhea, unspecified type   [facility] Primary Care and Sports Medicine at [facility], Jesse Sans, MD   2 years ago Essential hypertension   [place] and Sports Medicine at [facility], Jesse Sans, MD       Future Appointments             In 2 weeks Brendolyn Patty, MD [place]   In 2 months Army Melia, Jesse Sans, MD [facility] Primary Care and Sports Medicine at [facility], [place]   In 5 months [place], Kathlene November, [place]. [facility]             ezetimibe (ZETIA) 10 MG tablet [Pharmacy Med Name: EZETIMIBE 10 MG TABLET] 90 tablet 2    Sig: TAKE 1 TABLET BY MOUTH EVERY DAY     Cardiovascular:  Antilipid - Sterol Transport Inhibitors Failed - 04/18/2022  2:07 PM      Failed - Lipid Panel in normal range within the last 12 months    Cholesterol, Total  Date Value Ref Range Status  06/22/2021 226 (H) 100 - 199 mg/dL Final   LDL Chol Calc (NIH)  Date Value Ref Range Status  06/22/2021 136 (H) 0 - 99 mg/dL Final   HDL  Date Value Ref Range Status  06/22/2021 66 >39 mg/dL Final   Triglycerides  Date Value Ref Range Status  06/22/2021 136 0 - 149 mg/dL Final         Passed - AST in normal range and within 360 days    AST  Date Value Ref Range Status  03/15/2022 23 15 - 41 U/L Final         Passed - ALT in normal range and within 360 days    ALT  Date Value Ref Range Status  03/15/2022 19 0 - 44 U/L Final  Passed - Patient is not pregnant      Passed - Valid encounter within last 12 months    Recent Outpatient Visits           3 weeks ago Black stools   [place] Primary Care and Sports Medicine at [facility], Jesse Sans, MD   10 months ago Essential hypertension   [place] Primary Care and Sports Medicine at [facility], Jesse Sans, MD   1 year ago Essential hypertension   [place] Primary Care and Sports Medicine  at [facility], Jesse Sans, MD   2 years ago Diarrhea, unspecified type   [facility] Primary Care and Sports Medicine at [facility], Jesse Sans, MD   2 years ago Essential hypertension   [place] Primary Care and Sports Medicine at [facility], Jesse Sans, MD       Future Appointments             In 2 weeks Brendolyn Patty, MD [place]   In 2 months [place] Melia, Jesse Sans, MD [facility] Health Primary Care and Sports Medicine at [facility], [facility]   In 5 months [place], Kathlene November, [place]. [place]

## 2022-04-19 NOTE — Telephone Encounter (Signed)
Requested medication (s) are due for refill today -no  Requested medication (s) are on the active medication list -yes  Future visit scheduled -yes  Last refill: 02/13/22 #270  Notes to clinic: outside provider Rx  Requested Prescriptions  Pending Prescriptions Disp Refills   baclofen (LIORESAL) 10 MG tablet [Pharmacy Med Name: BACLOFEN 10 MG TABLET] 270 tablet 0    Sig: TAKE 1 TABLET BY MOUTH THREE TIMES A DAY     Analgesics:  Muscle Relaxants - baclofen Failed - 04/18/2022  2:07 PM      Failed - Cr in normal range and within 180 days    Creatinine, Ser  Date Value Ref Range Status  03/15/2022 1.29 (H) 0.44 - 1.00 mg/dL Final         Passed - eGFR is 30 or above and within 180 days    GFR calc Af Amer  Date Value Ref Range Status  02/01/2020 >60 >60 mL/min Final   GFR, Estimated  Date Value Ref Range Status  03/15/2022 44 (L) >60 mL/min Final    Comment:    (NOTE) Calculated using the CKD-EPI Creatinine Equation (2021)    eGFR  Date Value Ref Range Status  06/22/2021 61 >59 mL/min/1.73 Final         Passed - Valid encounter within last 6 months    Recent Outpatient Visits           3 weeks ago Black stools   [place] Primary Care and Sports Medicine at [facility], Jesse Sans, MD   10 months ago Essential hypertension   [place] Primary Care and Sports Medicine at [facility], Jesse Sans, MD   1 year ago Essential hypertension   [place] Primary Care and Sports Medicine at [facility], Jesse Sans, MD   2 years ago Diarrhea, unspecified type   [facility] Primary Care and Sports Medicine at [facility], Jesse Sans, MD   2 years ago Essential hypertension   [place] and Sports Medicine at [facility], Jesse Sans, MD       Future Appointments             In 2 weeks Brendolyn Patty, MD [place]   In 2 months Army Melia, Jesse Sans, MD [facility] Health Primary Care and Sports  Medicine at [facility], [place]   In 5 months [place], Kathlene November, [place]. [facility]            Refused Prescriptions Disp Refills   meloxicam (MOBIC) 15 MG tablet [Pharmacy Med Name: MELOXICAM 15 MG TABLET] 90 tablet 1    Sig: TAKE 1 TABLET BY MOUTH EVERY DAY AS NEEDED FOR PAIN     Analgesics:  COX2 Inhibitors Failed - 04/18/2022  2:07 PM      Failed - Manual Review: Labs are only required if the patient has taken medication for more than 8 weeks.      Failed - HGB in normal range and within 360 days    Hemoglobin  Date Value Ref Range Status  03/28/2022 16.1 (H) 11.1 - 15.9 g/dL Final         Failed - Cr in normal range and within 360 days    Creatinine, Ser  Date Value Ref Range Status  03/15/2022 1.29 (H) 0.44 - 1.00 mg/dL Final         Failed - HCT in normal range  and within 360 days    Hematocrit  Date Value Ref Range Status  03/28/2022 47.9 (H) 34.0 - 46.6 % Final         Passed - AST in normal range and within 360 days    AST  Date Value Ref Range Status  03/15/2022 23 15 - 41 U/L Final         Passed - ALT in normal range and within 360 days    ALT  Date Value Ref Range Status  03/15/2022 19 0 - 44 U/L Final         Passed - eGFR is 30 or above and within 360 days    GFR calc Af Amer  Date Value Ref Range Status  02/01/2020 >60 >60 mL/min Final   GFR, Estimated  Date Value Ref Range Status  03/15/2022 44 (L) >60 mL/min Final    Comment:    (NOTE) Calculated using the CKD-EPI Creatinine Equation (2021)    eGFR  Date Value Ref Range Status  06/22/2021 61 >59 mL/min/1.73 Final         Passed - Patient is not pregnant      Passed - Valid encounter within last 12 months    Recent Outpatient Visits           3 weeks ago Black stools   [place] Primary Care and Sports Medicine at [facility], Jesse Sans, MD   10 months ago Essential hypertension   [place] Primary Care and  Sports Medicine at [facility], Jesse Sans, MD   1 year ago Essential hypertension   [place] Primary Care and Sports Medicine at [facility], Jesse Sans, MD   2 years ago Diarrhea, unspecified type   [place] [facility] Primary Care and Sports Medicine at [facility], Jesse Sans, MD   2 years ago Essential hypertension   [place] Primary Care and Sports Medicine at [facility], Jesse Sans, MD       Future Appointments             In 2 weeks Brendolyn Patty, MD [place]   In 2 months Army Melia, Jesse Sans, MD [facility] Health Primary Care and Sports Medicine at [facility], [place]   In 5 months [place], Kathlene November, MD [place]. [facility]             ezetimibe (ZETIA) 10 MG tablet [Pharmacy Med Name: EZETIMIBE 10 MG TABLET] 90 tablet 2    Sig: TAKE 1 TABLET BY MOUTH EVERY DAY     Cardiovascular:  Antilipid - Sterol Transport Inhibitors Failed - 04/18/2022  2:07 PM      Failed - Lipid Panel in normal range within the last 12 months    Cholesterol, Total  Date Value Ref Range Status  06/22/2021 226 (H) 100 - 199 mg/dL Final   LDL Chol Calc (NIH)  Date Value Ref Range Status  06/22/2021 136 (H) 0 - 99 mg/dL Final   HDL  Date Value Ref Range Status  06/22/2021 66 >39 mg/dL Final   Triglycerides  Date Value Ref Range Status  06/22/2021 136 0 - 149 mg/dL Final         Passed - AST in normal range and within 360 days    AST  Date Value Ref Range Status  03/15/2022 23 15 - 41 U/L Final         Passed -  ALT in normal range and within 360 days    ALT  Date Value Ref Range Status  03/15/2022 19 0 - 44 U/L Final         Passed - Patient is not pregnant      Passed - Valid encounter within last 12 months    Recent Outpatient Visits           3 weeks ago Black stools   [place] [facility] and Sports Medicine at [facility], Jesse Sans, MD   10 months ago  Essential hypertension   [place] Primary Care and Sports Medicine at [facility], Jesse Sans, MD   1 year ago Essential hypertension   [place] Primary Care and Sports Medicine at [facility], Jesse Sans, MD   2 years ago Diarrhea, unspecified type   [facility] Primary Care and Sports Medicine at [facility], Jesse Sans, MD   2 years ago Essential hypertension   [place] Primary Care and Sports Medicine at [facility], Jesse Sans, MD       Future Appointments             In 2 weeks Brendolyn Patty, MD [place]   In 2 months [place] Melia, Jesse Sans, MD [facility] Health Primary Care and Sports Medicine at [facility], [facility]   In 5 months [place], Kathlene November, [place]. [facility]               Requested Prescriptions  Pending Prescriptions Disp Refills   baclofen (LIORESAL) 10 MG tablet [Pharmacy Med Name: BACLOFEN 10 MG TABLET] 270 tablet 0    Sig: TAKE 1 TABLET BY MOUTH THREE TIMES A DAY     Analgesics:  Muscle Relaxants - baclofen Failed - 04/18/2022  2:07 PM      Failed - Cr in normal range and within 180 days    Creatinine, Ser  Date Value Ref Range Status  03/15/2022 1.29 (H) 0.44 - 1.00 mg/dL Final         Passed - eGFR is 30 or above and within 180 days    GFR calc Af Amer  Date Value Ref Range Status  02/01/2020 >60 >60 mL/min Final   GFR, Estimated  Date Value Ref Range Status  03/15/2022 44 (L) >60 mL/min Final    Comment:    (NOTE) Calculated using the CKD-EPI Creatinine Equation (2021)    eGFR  Date Value Ref Range Status  06/22/2021 61 >59 mL/min/1.73 Final         Passed - Valid encounter within last 6 months    Recent Outpatient Visits           3 weeks ago Black stools   [place] Primary Care and Sports Medicine at [facility], Jesse Sans, MD   10 months ago Essential hypertension   [place] Primary Care and Sports  Medicine at [facility], Jesse Sans, MD   1 year ago Essential hypertension   [place] Primary Care and Sports Medicine at [facility], Jesse Sans, MD   2 years ago Diarrhea, unspecified type   [facility] Health Primary Care and Sports Medicine at [facility], Jesse Sans, MD   2 years ago Essential hypertension    Primary Care and Sports Medicine at [facility], Jesse Sans, MD       Future Appointments  In 2 weeks Brendolyn Patty, MD [place]   In 2 months Army Melia, Jesse Sans, MD [facility] Primary Care and Sports Medicine at [facility]   In 5 months [place], Kathlene November, MD [place]. [facility]            Refused Prescriptions Disp Refills   meloxicam (MOBIC) 15 MG tablet [Pharmacy Med Name: MELOXICAM 15 MG TABLET] 90 tablet 1    Sig: TAKE 1 TABLET BY MOUTH EVERY DAY AS NEEDED FOR PAIN     Analgesics:  COX2 Inhibitors Failed - 04/18/2022  2:07 PM      Failed - Manual Review: Labs are only required if the patient has taken medication for more than 8 weeks.      Failed - HGB in normal range and within 360 days    Hemoglobin  Date Value Ref Range Status  03/28/2022 16.1 (H) 11.1 - 15.9 g/dL Final         Failed - Cr in normal range and within 360 days    Creatinine, Ser  Date Value Ref Range Status  03/15/2022 1.29 (H) 0.44 - 1.00 mg/dL Final         Failed - HCT in normal range and within 360 days    Hematocrit  Date Value Ref Range Status  03/28/2022 47.9 (H) 34.0 - 46.6 % Final         Passed - AST in normal range and within 360 days    AST  Date Value Ref Range Status  03/15/2022 23 15 - 41 U/L Final         Passed - ALT in normal range and within 360 days    ALT  Date Value Ref Range Status  03/15/2022 19 0 - 44 U/L Final         Passed - eGFR is 30 or above and within 360 days    GFR calc Af Amer  Date Value Ref Range Status   02/01/2020 >60 >60 mL/min Final   GFR, Estimated  Date Value Ref Range Status  03/15/2022 44 (L) >60 mL/min Final    Comment:    (NOTE) Calculated using the CKD-EPI Creatinine Equation (2021)    eGFR  Date Value Ref Range Status  06/22/2021 61 >59 mL/min/1.73 Final         Passed - Patient is not pregnant      Passed - Valid encounter within last 12 months    Recent Outpatient Visits           3 weeks ago Black stools   [place] Primary Care and Sports Medicine at [facility], Jesse Sans, MD   10 months ago Essential hypertension   [place] Primary Care and Sports Medicine at [facility], Jesse Sans, MD   1 year ago Essential hypertension   [place] Primary Care and Sports Medicine at [facility], Jesse Sans, MD   2 years ago Diarrhea, unspecified type   [facility] [facility] Health Primary Care and Sports Medicine at [facility], Jesse Sans, MD   2 years ago Essential hypertension   [place] Primary Care and Sports Medicine at [facility], Jesse Sans, MD       Future Appointments             In 2 weeks Brendolyn Patty, MD [place]   In 2 months Army Melia Jesse Sans, MD [place]  Primary Care and Sports Medicine at [facility], [place]   In 5 months [place], Kathlene November, MD [place]. [facility]             ezetimibe (ZETIA) 10 MG tablet [Pharmacy Med Name: EZETIMIBE 10 MG TABLET] 90 tablet 2    Sig: TAKE 1 TABLET BY MOUTH EVERY DAY     Cardiovascular:  Antilipid - Sterol Transport Inhibitors Failed - 04/18/2022  2:07 PM      Failed - Lipid Panel in normal range within the last 12 months    Cholesterol, Total  Date Value Ref Range Status  06/22/2021 226 (H) 100 - 199 mg/dL Final   LDL Chol Calc (NIH)  Date Value Ref Range Status  06/22/2021 136 (H) 0 - 99 mg/dL Final   HDL  Date Value Ref Range Status  06/22/2021 66 >39 mg/dL Final   Triglycerides   Date Value Ref Range Status  06/22/2021 136 0 - 149 mg/dL Final         Passed - AST in normal range and within 360 days    AST  Date Value Ref Range Status  03/15/2022 23 15 - 41 U/L Final         Passed - ALT in normal range and within 360 days    ALT  Date Value Ref Range Status  03/15/2022 19 0 - 44 U/L Final         Passed - Patient is not pregnant      Passed - Valid encounter within last 12 months    Recent Outpatient Visits           3 weeks ago Black stools   [place] Primary Care and Sports Medicine at [facility], Jesse Sans, MD   10 months ago Essential hypertension   [place] Primary Care and Sports Medicine at [facility], Jesse Sans, MD   1 year ago Essential hypertension   [place] Primary Care and Sports Medicine at [facility], Jesse Sans, MD   2 years ago Diarrhea, unspecified type   [facility] Primary Care and Sports Medicine at [facility], Jesse Sans, MD   2 years ago Essential hypertension   [place] Primary Care and Sports Medicine at [facility], Jesse Sans, MD       Future Appointments             In 2 weeks Brendolyn Patty, MD [place]   In 2 months Army Melia, Jesse Sans, MD [facility] Primary Care and Sports Medicine at [facility], [place]   In 5 months [place], Kathlene November, MD [place]. [place]

## 2022-04-19 NOTE — Progress Notes (Signed)
Subjective:   Audrey Martinez is a 76 y.o. female who presents for Medicare Annual (Subsequent) preventive examination.  I connected with  Audrey Martinez on 04/20/22 by a audio enabled telemedicine application and verified that I am speaking with the correct person using two identifiers.  Patient Location: Home  Provider Location: Office/Clinic  I discussed the limitations of evaluation and management by telemedicine. The patient expressed understanding and agreed to proceed.   Review of Systems    Defer to PCP Cardiac Risk Factors include: advanced age (>2mn, >>21women)     Objective:    Today's Vitals   04/19/22 1630  Weight: 236 lb 8 oz (107.3 kg)  Height: '5\' 7"'$  (1.702 m)   Body mass index is 37.04 kg/m.     04/20/2022   10:05 AM 03/15/2022    6:25 PM 03/03/2022    1:07 PM 02/24/2022    3:11 PM 02/12/2022    6:53 PM 02/12/2022   12:42 PM 11/17/2021    2:03 PM  Advanced Directives  Does Patient Have a Medical Advance Directive? Yes No No No  No No  Type of Advance Directive Living will        Would patient like information on creating a medical advance directive?  No - Patient declined No - Patient declined No - Patient declined No - Patient declined  No - Patient declined    Current Medications (verified) Outpatient Encounter Medications as of 04/20/2022  Medication Sig   albuterol (PROVENTIL HFA;VENTOLIN HFA) 108 (90 Base) MCG/ACT inhaler Inhale 1-2 puffs into the lungs every 6 (six) hours as needed for wheezing or shortness of breath.   B Complex-C (B-COMPLEX WITH VITAMIN C) tablet Take 1 tablet by mouth daily.    baclofen (LIORESAL) 10 MG tablet Take 1 tablet (10 mg total) by mouth 2 (two) times daily. Home med.   Bioflavonoid Products (ESTER-C) TABS Take 1 tablet by mouth 2 (two) times daily.    calcium carbonate (OS-CAL - DOSED IN MG OF ELEMENTAL CALCIUM) 1250 (500 Ca) MG tablet Take 1 tablet by mouth.   carvedilol (COREG) 6.25 MG tablet Take 1  tablet (6.25 mg total) by mouth 2 (two) times daily with a meal.   cetirizine (ZYRTEC) 10 MG tablet Take 10 mg by mouth as needed.   cholecalciferol (VITAMIN D) 25 MCG (1000 UT) tablet Take 1,000 Units by mouth daily.   Cranberry 1000 MG CAPS Take 1,000 mg by mouth daily.    empagliflozin (JARDIANCE) 10 MG TABS tablet Take 1 tablet (10 mg total) by mouth daily.   ezetimibe (ZETIA) 10 MG tablet TAKE 1 TABLET BY MOUTH EVERY DAY   famotidine (PEPCID) 20 MG tablet Take 20 mg by mouth as needed.   fluticasone (FLOVENT HFA) 110 MCG/ACT inhaler INHALE 1 PUFFS INTO THE LUNGS TWICE A DAY.  Home med.   gabapentin (NEURONTIN) 300 MG capsule Take 1 capsule (300 mg total) by mouth 2 (two) times daily. Home med.   Ginger, Zingiber officinalis, (GINGER PO) Take 1,100 mg by mouth daily.    HYDROcodone-acetaminophen (NORCO/VICODIN) 5-325 MG tablet Take 1 tablet by mouth every 6 (six) hours as needed.   ipratropium (ATROVENT) 0.03 % nasal spray SMARTSIG:1-2 Spray(s) Both Nares 3 Times Daily   meloxicam (MOBIC) 15 MG tablet TAKE 1 TABLET BY MOUTH EVERY DAY AS NEEDED FOR PAIN   Misc Natural Products (TART CHERRY ADVANCED PO) Take 1 capsule by mouth daily.    montelukast (SINGULAIR) 10 MG tablet Take  10 mg by mouth every evening.   Multiple Vitamins-Minerals (MULTIVITAMIN WITH MINERALS) tablet Take 1 tablet by mouth daily. Centrum Silver   Olodaterol HCl (STRIVERDI RESPIMAT) 2.5 MCG/ACT AERS Inhale 2 puffs into the lungs daily. USE AT THE SAME TIME EVERYDAY   Probiotic Product (PROBIOTIC DAILY PO) Take 1 capsule by mouth 2 (two) times a week.    rivaroxaban (XARELTO) 20 MG TABS tablet Take 1 tablet (20 mg total) by mouth as needed (PRN for long car or plane ride).   sacubitril-valsartan (ENTRESTO) 24-26 MG TAKE 1 TABLET BY MOUTH TWICE A DAY   spironolactone (ALDACTONE) 25 MG tablet Take 0.5 tablets (12.5 mg total) by mouth daily.   Turmeric 500 MG CAPS Take 500 mg by mouth daily.    zinc sulfate 220 (50 Zn) MG  capsule Take 1 capsule (220 mg total) by mouth daily.   No facility-administered encounter medications on file as of 04/20/2022.    Allergies (verified) Amoxicillin, Aspirin, Bacitracin-polymyx-neo-hc [bacitra-neomycin-polymyxin-hc], Ciprofloxacin, Ciprofloxacin-dexamethasone, Crestor [rosuvastatin], Erythromycin, Hydrochlorothiazide, Ibuprofen, Morphine and related, Morpholine salicylate, Omeprazole, Doxycycline hyclate, Keflex [cephalexin], Lovastatin, Simvastatin, Neomycin-bacitracin zn-polymyx, and Tape   History: Past Medical History:  Diagnosis Date   Actinic keratosis    Acute pulmonary embolus ([place]) 01/29/2020   Acute renal failure (ARF) ([place]) 01/31/2020   Acute respiratory failure with hypoxia ([place]) 01/29/2020   Acute septic pulmonary embolism ([facility])    AKI (acute kidney injury) ([place])    Amaurosis fugax, right eye 12/2018   Arthritis    Asthma    Basal cell carcinoma 07/2016   central forehead (tx in [place])   Cardiomyopathy [facility])    a. 04/2018 Echo: EF 45-50%. Anteroseptal and apical HK in some views. Mild AI/MR. Nl RV fxn; b. 05/2018 MV: mid-dist ant and antsept mild defect - ? breast atten. EF 41%. No ischemia; c. 03/2019 Echo: EF 30-35%, Gr2 DD, Trace MR, triv TR. Asc Ao ectatic dil - 35m.    Coronary artery disease    Dyspnea    Dysrhythmia    GERD (gastroesophageal reflux disease)    Herniated disc, lumbar    Hyperlipidemia    Left wrist fracture    [place]oma ([place] 05/12/[place]   Melanoma IS R post neck, excised 08/10/20   Osteoporosis    Pneumonia due to COVID-19 virus 01/26/2020   [place] 99/12/9209  Complication of CHERDE-08infection 12/2019:  Xarelto 20 mg daily x 6 months then PRN   Stroke ([place] 1998   SVT (supraventricular tachycardia)    a.2008 s/p RFCA for AVNRT; b. 9 & 02/2019 Zio x 2: 1. Avg HR 89, 3 runs NSVT (longest 7 beats), [place]itz 1. 2. Avg HR 89, NSVT x 1 (5 beats), Mobitz 1. No signif arrhythmia.   Tendinitis of right knee 07/03/2017    Past Surgical History:  Procedure Laterality Date   BACK SURGERY  2017   L2-4 laminectomy and foraminal stenosis   BREAST CYST ASPIRATION Left 1977   CARDIAC CATHETERIZATION  2009   [facility] [place]   CATARACT EXTRACTION, BILATERAL  2010   COLONOSCOPY WITH PROPOFOL N/A 03/21/2018   Procedure: COLONOSCOPY WITH Biopsy;  Surgeon: VLin Landsman MD;  [facility]  Service: Endoscopy;  Laterality: N/A;   COLONOSCOPY WITH PROPOFOL N/A 05/03/2021   Procedure: COLONOSCOPY WITH PROPOFOL;  Surgeon: VLin Landsman MD;  [facility]  Service: Gastroenterology;  Laterality: N/A;   CORONARY ANGIOPLASTY  derrick procedure  1977-78   bilateral    ESOPHAGOGASTRODUODENOSCOPY  2013   gastritis; done for complaint of dysphagia   ESOPHAGOGASTRODUODENOSCOPY (EGD) WITH PROPOFOL N/A 03/21/2018   Procedure: ESOPHAGOGASTRODUODENOSCOPY (EGD) WITH Biopsies;  Surgeon: Lin Landsman, MD;  Location: [facility];  Service: Endoscopy;  Laterality: N/A;  KEEP THIS PATIENT FIRST   JOINT REPLACEMENT     LEFT HEART CATH AND CORONARY ANGIOGRAPHY Left 05/02/2019   Procedure: LEFT HEART CATH AND CORONARY ANGIOGRAPHY;  Surgeon: Minna Merritts, MD;  Location: [facility];  Service: Cardiovascular;  Laterality: Left;   lymph node removal     neck   MOHS SURGERY  07/2016   BCCA nose   POLYPECTOMY N/A 03/21/2018   Procedure: POLYPECTOMY;  Surgeon: Lin Landsman, MD;  Location: [facility];  Service: Endoscopy;  Laterality: N/A;   REPLACEMENT TOTAL KNEE  2013   RT   TOTAL SHOULDER REPLACEMENT  2012   RT    Family History  Problem Relation Age of Onset   Diabetes Mother    Stroke Mother    Valvular heart disease Father    CAD Sister 13       died of MI   Stroke Sister    Stroke Sister 73   Clotting disorder Half-Sister    Clotting disorder Half-Sister     Breast cancer Neg Hx    Social History   Socioeconomic History   Marital status: Widowed    Spouse name: Not on file   Number of children: 3   Years of education: Not on file   Highest education level: Not on file  Occupational History   Occupation: retired  Tobacco Use   Smoking status: Never   Smokeless tobacco: Never  Vaping Use   Vaping Use: Never used  Substance and Sexual Activity   Alcohol use: Not Currently    Comment: may drink 1-2x/yr   Drug use: No   Sexual activity: Never  Other Topics Concern   Not on file  Social History Narrative   ** Merged History Encounter **       Lives at home alone, has resources if help is needed   Social Determinants of Health   Financial Resource Strain: Low Risk  (04/20/2022)   Overall Financial Resource Strain (CARDIA)    Difficulty of Paying Living Expenses: Not hard at all  Food Insecurity: No Food Insecurity (04/20/2022)   Hunger Vital Sign    Worried About Running Out of Food in the Last Year: Never true    [place] in the Last Year: Never true  Transportation Needs: No Transportation Needs (04/20/2022)   PRAPARE - Hydrologist (Medical): No    Lack of Transportation (Non-Medical): No  Physical Activity: Insufficiently Active (04/20/2022)   Exercise Vital Sign    Days of Exercise per Week: 7 days    Minutes of Exercise per Session: 20 min  Stress: No Stress Concern Present (04/20/2022)   [place]    Feeling of Stress : Not at all  Social Connections: Moderately Isolated (04/20/2022)   Social Connection and Isolation Panel [NHANES]    Frequency of Communication with Friends and Family: More than three times a week    Frequency of Social Gatherings with Friends and Family: More than three times a week    Attends Religious Services: More than 4 times per year    Active Member  of Clubs or Organizations: No     Attends Archivist Meetings: Never    Marital Status: Widowed    Tobacco Counseling Counseling given: Not Answered   Clinical Intake:  Pre-visit preparation completed: Yes  Pain : No/denies pain     BMI - recorded: 37.04 Nutritional Status: BMI > 30  Obese Diabetes: No     Diabetic? No.     Information entered by :: Wyatt Haste, [place] [facility]    04/20/2022   10:06 AM 02/12/2022    6:35 PM  In your present state of health, do you have any difficulty performing the following activities:  Hearing? 0   Vision? 0   Difficulty concentrating or making decisions? 0   Walking or climbing stairs? 1   Dressing or bathing? 0   Doing errands, shopping? 0 0  Preparing Food and eating ? N   Using the Toilet? N   In the past six months, have you accidently leaked urine? N   Do you have problems with loss of bowel control? N   Managing your Medications? N   Managing your Finances? N   Housekeeping or managing your Housekeeping? N     Patient Care Team: Glean Hess, MD as PCP - General (Internal Medicine) Minna Merritts, MD as PCP - Cardiology (Cardiology) Flora Lipps, MD as Consulting Physician (Pulmonary Disease) Gillis Santa, MD as Consulting Physician (Pain Medicine) Earnestine Leys, MD (Orthopedic Surgery) Minna Merritts, MD as Consulting Physician (Cardiology) Brendolyn Patty, MD (Dermatology)  Indicate any recent Medical Services you may have received from other than [facility] providers in the past year (date may be approximate).     Assessment:   This is a routine wellness examination for Audrey Martinez.  Hearing/Vision screen Hearing Screening - Comments:: No concerns. Vision Screening - Comments:: Wears glasses. Just seen eye doctor.  Dietary issues and exercise activities discussed: Current Exercise Habits: The patient does not participate in regular exercise at present, Exercise limited by: orthopedic condition(s)   Goals  Addressed   None   Depression Screen    04/20/2022   10:06 AM 03/28/2022   10:39 AM 06/22/2021    8:25 AM 04/07/2021    8:35 AM 05/06/2020   10:01 AM 04/06/2020    8:38 AM 11/25/2019   10:29 AM  PHQ 2/9 Scores  PHQ - 2 Score 0 0 0 0 0 0 0  PHQ- 9 Score 0 2 0  0  0    Fall Risk    04/20/2022   10:06 AM 03/28/2022   10:39 AM 06/22/2021    8:25 AM 04/07/2021    8:40 AM 05/06/2020   10:01 AM  Fall Risk   Falls in the past year? 1 1 0 1 0  Number falls in past yr: 1 1 0 0   Injury with Fall? 1 1 0 1   Risk for fall due to : History of fall(s) History of fall(s) No Fall Risks History of fall(s)   Follow up Falls evaluation completed Falls evaluation completed Falls evaluation completed Falls prevention discussed Falls evaluation completed    FALL RISK PREVENTION PERTAINING TO THE HOME:  Any stairs in or around the home? No  If so, are there any without handrails?  N/A Home free of loose throw rugs in walkways, pet beds, electrical cords, etc? Yes  Adequate lighting in your home to reduce risk of falls? Yes   ASSISTIVE DEVICES UTILIZED TO PREVENT FALLS:  Life alert? Yes  Use of a cane, walker or w/c? Yes  Grab bars in the bathroom? No  Shower chair or bench in shower? No  Elevated toilet seat or a handicapped toilet? No.  Cognitive Function:        04/20/2022   10:07 AM 04/06/2020    8:41 AM 01/28/2019   11:30 AM  6CIT Screen  What Year? 0 points 0 points 0 points  What month? 0 points 0 points 0 points  What time? 0 points 0 points 0 points  Count back from 20 0 points 0 points 0 points  Months in reverse 0 points 0 points 2 points  Repeat phrase 4 points 0 points 0 points  Total Score 4 points 0 points 2 points    Immunizations Immunization History  Administered Date(s) Administered   Pneumococcal Conjugate-13 07/22/2015   Pneumococcal Polysaccharide-23 07/22/2013   Td 03/03/2002    TDAP status: Up to date  Flu Vaccine status: Declined, Education has been  provided regarding the importance of this vaccine but patient still declined. Advised may receive this vaccine at local pharmacy or Health Dept. Aware to provide a copy of the vaccination record if obtained from local pharmacy or Health Dept. Verbalized acceptance and understanding.  Pneumococcal vaccine status: Up to date  Covid-19 vaccine status: Declined, Education has been provided regarding the importance of this vaccine but patient still declined. Advised may receive this vaccine at local pharmacy or Health Dept.or vaccine clinic. Aware to provide a copy of the vaccination record if obtained from local pharmacy or Health Dept. Verbalized acceptance and understanding.  Qualifies for Shingles Vaccine? Yes   Zostavax completed No   Shingrix Completed?: No.    Education has been provided regarding the importance of this vaccine. Patient has been advised to call insurance company to determine out of pocket expense if they have not yet received this vaccine. Advised may also receive vaccine at local pharmacy or Health Dept. Verbalized acceptance and understanding.  Screening Tests Health Maintenance  Topic Date Due   COVID-19 Vaccine (1) Never done   Zoster Vaccines- Shingrix (1 of 2) Never done   INFLUENZA VACCINE  08/28/2022 (Originally 12/28/2021)   MAMMOGRAM  08/19/2022   Medicare Annual Wellness (AWV)  04/21/2023   COLONOSCOPY (Pts 45-4yr Insurance coverage will need to be confirmed)  05/03/2024   Pneumonia Vaccine 76 Years old  Completed   DEXA SCAN  Completed   Hepatitis C Screening  Completed   HPV VACCINES  Aged Out    Health Maintenance  Health Maintenance Due  Topic Date Due   COVID-19 Vaccine (1) Never done   Zoster Vaccines- Shingrix (1 of 2) Never done    Colorectal cancer screening: Type of screening: Colonoscopy. Completed 05/03/2021. Repeat every 3 years  Mammogram status: Completed 08/18/2021. Repeat every year  Bone Density status: Completed 02/26/2019. Results  reflect: Bone density results: OSTEOPOROSIS. Repeat every 2-3 years.  Lung Cancer Screening: (Low Dose CT Chest recommended if Age 134-80years, 30 pack-year currently smoking OR have quit w/in 15years.) does not qualify.   Additional Screening:  Hepatitis C Screening: does qualify; Completed 11/15/2018  Vision Screening: Recommended annual ophthalmology exams for early detection of glaucoma and other disorders of the eye. Is the patient up to date with their annual eye exam?  Yes  Who is the provider or what is the name of the office in which the patient attends annual eye exams? [facility] not  established with a provider, would they like to be referred to a provider to establish care?  N/A .   Dental Screening: Recommended annual dental exams for proper oral hygiene  Community Resource Referral / Chronic Care Management: CRR required this visit?  No   CCM required this visit?  No      Plan:     I have personally reviewed and noted the following in the patient's chart:   Medical and social history Use of alcohol, tobacco or illicit drugs  Current medications and supplements including opioid prescriptions. Patient is not currently taking opioid prescriptions. Functional ability and status Nutritional status Physical activity Advanced directives List of other physicians Hospitalizations, surgeries, and ER visits in previous 12 months Vitals Screenings to include cognitive, depression, and falls Referrals and appointments  In addition, I have reviewed and discussed with patient certain preventive protocols, quality metrics, and best practice recommendations. A written personalized care plan for preventive services as well as general preventive health recommendations were provided to patient.     Audrey Martinez, [place]   04/20/2022    [name] , Thank you for taking time to come for your Medicare Wellness Visit. I appreciate your ongoing  commitment to your health goals. Please review the following plan we discussed and let me know if I can assist you in the future.   These are the goals we discussed:  Goals      Patient Stated     Pt is leading women's Bible Study and would like to continue in a Godly way        This is a list of the screening recommended for you and due dates:  Health Maintenance  Topic Date Due   COVID-19 Vaccine (1) Never done   Zoster (Shingles) Vaccine (1 of 2) Never done   Flu Shot  08/28/2022*   Mammogram  08/19/2022   Medicare Annual Wellness Visit  04/21/2023   Colon Cancer Screening  05/03/2024   Pneumonia Vaccine  Completed   DEXA scan (bone density measurement)  Completed   Hepatitis C Screening: USPSTF Recommendation to screen - Ages 53-79 yo.  Completed   HPV Vaccine  Aged Out  *Topic was postponed. The date shown is not the original due date.     Nurse Notes: None.

## 2022-04-20 ENCOUNTER — Ambulatory Visit (INDEPENDENT_AMBULATORY_CARE_PROVIDER_SITE_OTHER): Payer: Medicare Other

## 2022-04-20 VITALS — Ht 67.0 in | Wt 236.5 lb

## 2022-04-20 DIAGNOSIS — Z0000 Encounter for general adult medical examination without abnormal findings: Secondary | ICD-10-CM | POA: Diagnosis not present

## 2022-04-22 ENCOUNTER — Other Ambulatory Visit: Payer: Self-pay | Admitting: Internal Medicine

## 2022-04-22 DIAGNOSIS — E782 Mixed hyperlipidemia: Secondary | ICD-10-CM

## 2022-04-25 NOTE — Telephone Encounter (Signed)
Unable to refill per protocol, Rx requests were refused, due to request is too soon. Will refuse duplicate requests.  Requested Prescriptions  Pending Prescriptions Disp Refills   baclofen (LIORESAL) 10 MG tablet [Pharmacy Med Name: BACLOFEN 10 MG TABLET] 270 tablet 0    Sig: TAKE 1 TABLET BY MOUTH THREE TIMES A DAY     Analgesics:  Muscle Relaxants - baclofen Failed - 04/22/2022 11:08 AM      Failed - Cr in normal range and within 180 days    Creatinine, Ser  Date Value Ref Range Status  03/15/2022 1.29 (H) 0.44 - 1.00 mg/dL Final         Passed - eGFR is 30 or above and within 180 days    GFR calc Af Amer  Date Value Ref Range Status  02/01/2020 >60 >60 mL/min Final   GFR, Estimated  Date Value Ref Range Status  03/15/2022 44 (L) >60 mL/min Final    Comment:    (NOTE) Calculated using the CKD-EPI Creatinine Equation (2021)    eGFR  Date Value Ref Range Status  06/22/2021 61 >59 mL/min/1.73 Final         Passed - Valid encounter within last 6 months    Recent Outpatient Visits           4 weeks ago Black stools   [place] [facility] at [facility], Jesse Sans, MD   10 months ago Essential hypertension   [place] [facility] at [facility], Jesse Sans, MD   1 year ago Essential hypertension   [place] [facility] at [facility], Jesse Sans, MD   2 years ago Diarrhea, unspecified type   [facility] at [facility], Jesse Sans, MD   2 years ago Essential hypertension   [place] [facility] at [facility], Jesse Sans, MD       Future Appointments             In 1 week Brendolyn Patty, MD [place]   In 2 months Army Melia, Jesse Sans, MD [facility] at [facility], [place]   In 5 months [place], Kathlene November, [place]. [facility]             meloxicam (MOBIC) 15 MG tablet [Pharmacy Med Name: MELOXICAM 15 MG TABLET] 90 tablet 1    Sig: TAKE 1 TABLET BY MOUTH EVERY DAY AS NEEDED FOR PAIN     Analgesics:  COX2 Inhibitors Failed - 04/22/2022 11:08 AM      Failed - Manual Review: Labs are only required if the patient has taken medication for more than 8 weeks.      Failed - HGB in normal range and within 360 days    Hemoglobin  Date Value Ref Range Status  03/28/2022 16.1 (H) 11.1 - 15.9 g/dL Final         Failed - Cr in normal range and within 360 days    Creatinine, Ser  Date Value Ref Range Status  03/15/2022 1.29 (H) 0.44 - 1.00 mg/dL Final         Failed - HCT in normal range and within 360 days    Hematocrit  Date Value Ref Range Status  03/28/2022 47.9 (H) 34.0 - 46.6 % Final  Passed - AST in normal range and within 360 days    AST  Date Value Ref Range Status  03/15/2022 23 15 - 41 U/L Final         Passed - ALT in normal range and within 360 days    ALT  Date Value Ref Range Status  03/15/2022 19 0 - 44 U/L Final         Passed - eGFR is 30 or above and within 360 days    GFR calc Af Amer  Date Value Ref Range Status  02/01/2020 >60 >60 mL/min Final   GFR, Estimated  Date Value Ref Range Status  03/15/2022 44 (L) >60 mL/min Final    Comment:    (NOTE) Calculated using the CKD-EPI Creatinine Equation (2021)    eGFR  Date Value Ref Range Status  06/22/2021 61 >59 mL/min/1.73 Final         Passed - Patient is not pregnant      Passed - Valid encounter within last 12 months    Recent Outpatient Visits           4 weeks ago Black stools   [place] [facility] at [facility], Jesse Sans, MD   10 months ago Essential hypertension   [place] [facility] at [facility], Jesse Sans, MD   1 year ago Essential hypertension   [place] [facility] at  [facility], Jesse Sans, MD   2 years ago Diarrhea, unspecified type   [facility] at [facility], Jesse Sans, MD   2 years ago Essential hypertension   [place] and Sports Medicine at [facility], Jesse Sans, MD       Future Appointments             In 1 week Brendolyn Patty, MD [place]   In 2 months Army Melia, Jesse Sans, MD [facility] at [facility], [place]   In 5 months [place], Kathlene November, [place]. [facility]             ezetimibe (ZETIA) 10 MG tablet [Pharmacy Med Name: EZETIMIBE 10 MG TABLET] 90 tablet 2    Sig: TAKE 1 TABLET BY MOUTH EVERY DAY     Cardiovascular:  Antilipid - Sterol Transport Inhibitors Failed - 04/22/2022 11:08 AM      Failed - Lipid Panel in normal range within the last 12 months    Cholesterol, Total  Date Value Ref Range Status  06/22/2021 226 (H) 100 - 199 mg/dL Final   LDL Chol Calc (NIH)  Date Value Ref Range Status  06/22/2021 136 (H) 0 - 99 mg/dL Final   HDL  Date Value Ref Range Status  06/22/2021 66 >39 mg/dL Final   Triglycerides  Date Value Ref Range Status  06/22/2021 136 0 - 149 mg/dL Final         Passed - AST in normal range and within 360 days    AST  Date Value Ref Range Status  03/15/2022 23 15 - 41 U/L Final         Passed - ALT in normal range and within 360 days    ALT  Date Value Ref Range Status  03/15/2022 19 0 - 44 U/L Final  Passed - Patient is not pregnant      Passed - Valid encounter within last 12 months    Recent Outpatient Visits           4 weeks ago Black stools   [place] [facility] at [facility], Jesse Sans, MD   10 months ago Essential hypertension   [place] [facility] at [facility], Jesse Sans, MD   1 year ago Essential hypertension    [place] [facility] at [facility], Jesse Sans, MD   2 years ago Diarrhea, unspecified type   [facility] at [facility], Jesse Sans, MD   2 years ago Essential hypertension   [place] [facility] at [facility], Jesse Sans, MD       Future Appointments             In 1 week Brendolyn Patty, MD [place]   In 2 months Army Melia, Jesse Sans, MD [facility] Primary Care and [facility] Medicine at [facility], [facility]   In 5 months [place], Kathlene November, [place]. [place]

## 2022-05-04 ENCOUNTER — Ambulatory Visit (INDEPENDENT_AMBULATORY_CARE_PROVIDER_SITE_OTHER): Payer: Medicare Other | Admitting: Dermatology

## 2022-05-04 ENCOUNTER — Encounter: Payer: Self-pay | Admitting: Dermatology

## 2022-05-04 DIAGNOSIS — Z85828 Personal history of other malignant neoplasm of skin: Secondary | ICD-10-CM

## 2022-05-04 DIAGNOSIS — D229 Melanocytic nevi, unspecified: Secondary | ICD-10-CM | POA: Diagnosis not present

## 2022-05-04 DIAGNOSIS — Z1283 Encounter for screening for malignant neoplasm of skin: Secondary | ICD-10-CM | POA: Diagnosis not present

## 2022-05-04 DIAGNOSIS — L820 Inflamed seborrheic keratosis: Secondary | ICD-10-CM | POA: Diagnosis not present

## 2022-05-04 DIAGNOSIS — L821 Other seborrheic keratosis: Secondary | ICD-10-CM

## 2022-05-04 DIAGNOSIS — Z86006 Personal history of melanoma in-situ: Secondary | ICD-10-CM | POA: Diagnosis not present

## 2022-05-04 DIAGNOSIS — L219 Seborrheic dermatitis, unspecified: Secondary | ICD-10-CM

## 2022-05-04 DIAGNOSIS — L578 Other skin changes due to chronic exposure to nonionizing radiation: Secondary | ICD-10-CM

## 2022-05-04 DIAGNOSIS — L814 Other melanin hyperpigmentation: Secondary | ICD-10-CM

## 2022-05-04 DIAGNOSIS — L853 Xerosis cutis: Secondary | ICD-10-CM | POA: Diagnosis not present

## 2022-05-04 NOTE — Progress Notes (Signed)
Follow-Up Visit   Subjective  Audrey Martinez is a 76 y.o. female who presents for the following: Annual Exam (HxMIS. Hx of Aks, HxBCC). She has some spots on her face and temple that stay scaly and are irritated.  The patient presents for Total-Body Skin Exam (TBSE) for skin cancer screening and mole check.  The patient has spots, moles and lesions to be evaluated, some may be new or changing and the patient has concerns that these could be cancer.   The following portions of the chart were reviewed this encounter and updated as appropriate:      Review of Systems: No other skin or systemic complaints except as noted in HPI or Assessment and Plan.   Objective  Well appearing patient in no apparent distress; mood and affect are within normal limits.  A full examination was performed including scalp, head, eyes, ears, nose, lips, neck, chest, axillae, abdomen, back, buttocks, bilateral upper extremities, bilateral lower extremities, hands, feet, fingers, toes, fingernails, and toenails. All findings within normal limits unless otherwise noted below.  Right Temple x1, left medial cheek x1, left lower neck x1, sternal notch x1 (4) Erythematous keratotic or waxy stuck-on papule  eyebrows, glabella, perinasal Pink patches with greasy scale.   Left Medial Knee 1.4cm waxy tan patch   Assessment & Plan   History of Melanoma in Situ. Right post neck. Excised 08/10/2020. - No evidence of recurrence today - Recommend regular full body skin exams - Recommend daily broad spectrum sunscreen SPF 30+ to sun-exposed areas, reapply every 2 hours as needed.  - Call if any new or changing lesions are noted between office visits   History of Basal Cell Carcinoma of the Skin. Central forehead. - No evidence of recurrence today - Recommend regular full body skin exams - Recommend daily broad spectrum sunscreen SPF 30+ to sun-exposed areas, reapply every 2 hours as needed.  - Call if any new  or changing lesions are noted between office visits   Lentigines - Scattered tan macules - Due to sun exposure - Benign-appearing, observe - Recommend daily broad spectrum sunscreen SPF 30+ to sun-exposed areas, reapply every 2 hours as needed. - Call for any changes  Seborrheic Keratoses - Stuck-on, waxy, tan-brown papules and/or plaques  - Benign-appearing - Discussed benign etiology and prognosis. - Observe - Call for any changes  Melanocytic Nevi - Tan-brown and/or pink-flesh-colored symmetric macules and papules - Benign appearing on exam today - Observation - Call clinic for new or changing moles - Recommend daily use of broad spectrum spf 30+ sunscreen to sun-exposed areas.   Hemangiomas - Red papules - Discussed benign nature - Observe - Call for any changes  Actinic Damage - Chronic condition, secondary to cumulative UV/sun exposure - diffuse scaly erythematous macules with underlying dyspigmentation - Recommend daily broad spectrum sunscreen SPF 30+ to sun-exposed areas, reapply every 2 hours as needed.  - Staying in the shade or wearing long sleeves, sun glasses (UVA+UVB protection) and wide brim hats (4-inch brim around the entire circumference of the hat) are also recommended for sun protection.  - Call for new or changing lesions.  Skin cancer screening performed today.  Xerosis. Scattered body. - diffuse xerotic patches - recommend gentle, hydrating skin care - gentle skin care handout given   Inflamed seborrheic keratosis (4) Right Temple x1, left medial cheek x1, left lower neck x1, sternal notch x1  Symptomatic, irritating, patient would like treated.  Destruction of lesion - Right Temple x1, left  medial cheek x1, left lower neck x1, sternal notch x1  Destruction method: cryotherapy   Informed consent: discussed and consent obtained   Lesion destroyed using liquid nitrogen: Yes   Region frozen until ice ball extended beyond lesion: Yes    Outcome: patient tolerated procedure well with no complications   Post-procedure details: wound care instructions given   Additional details:  Prior to procedure, discussed risks of blister formation, small wound, skin dyspigmentation, or rare scar following cryotherapy. Recommend Vaseline ointment to treated areas while healing.   Seborrheic dermatitis eyebrows, glabella, perinasal  Chronic and persistent condition with duration or expected duration over one year. Condition is symptomatic / bothersome to patient. Not to goal.  Recommend using OTC Hydrocortisone 1% cream twice daily as needed for flares to aas face.   Seborrheic keratosis Left Medial Knee  Reassured benign age-related growth.  Recommend observation.  Discussed cryotherapy if spot(s) become irritated or inflamed.   Return in about 6 months (around 11/03/2022) for TBSE.  I, Emelia Salisbury, CMA, am acting as scribe for Brendolyn Patty, MD.  Documentation: I have reviewed the above documentation for accuracy and completeness, and I agree with the above.  Brendolyn Patty MD

## 2022-05-04 NOTE — Patient Instructions (Signed)
Face: Recommend using OTC Hydrocortisone 1% cream twice daily as needed for flares.   Cryotherapy Aftercare  Wash gently with soap and water everyday.   Apply Vaseline daily until healed.    Gentle Skin Care Guide  1. Bathe no more than once a day.  2. Avoid bathing in hot water  3. Use a mild soap like Dove, Vanicream, Cetaphil, CeraVe. Can use Lever 2000 or Cetaphil antibacterial soap  4. Use soap only where you need it. On most days, use it under your arms, between your legs, and on your feet. Let the water rinse other areas unless visibly dirty.  5. When you get out of the bath/shower, use a towel to gently blot your skin dry, don't rub it.  6. While your skin is still a little damp, apply a moisturizing cream such as Vanicream, CeraVe, Cetaphil, Eucerin, Sarna lotion or plain Vaseline Jelly. For hands apply Neutrogena [place] Hand Cream or Excipial Hand Cream.  7. Reapply moisturizer any time you start to itch or feel dry.  8. Sometimes using free and clear laundry detergents can be helpful. Fabric softener sheets should be avoided. Downy Free & Gentle liquid, or any liquid fabric softener that is free of dyes and perfumes, it acceptable to use  9. If your doctor has given you prescription creams you may apply moisturizers over them      Melanoma ABCDEs  Melanoma is the most dangerous type of skin cancer, and is the leading cause of death from skin disease.  You are more likely to develop melanoma if you: Have light-colored skin, light-colored eyes, or red or blond hair Spend a lot of time in the sun Tan regularly, either outdoors or in a tanning bed Have had blistering sunburns, especially during childhood Have a close family member who has had a melanoma Have atypical moles or large birthmarks  Early detection of melanoma is key since treatment is typically straightforward and cure rates are extremely high if we catch it early.   The first sign of melanoma is  often a change in a mole or a new dark spot.  The ABCDE system is a way of remembering the signs of melanoma.  A for asymmetry:  The two halves do not match. B for border:  The edges of the growth are irregular. C for color:  A mixture of colors are present instead of an even brown color. D for diameter:  Melanomas are usually (but not always) greater than 84m - the size of a pencil eraser. E for evolution:  The spot keeps changing in size, shape, and color.  Please check your skin once per month between visits. You can use a small mirror in front and a large mirror behind you to keep an eye on the back side or your body.   If you see any new or changing lesions before your next follow-up, please call to schedule a visit.  Please continue daily skin protection including broad spectrum sunscreen SPF 30+ to sun-exposed areas, reapplying every 2 hours as needed when you're outdoors.   Staying in the shade or wearing long sleeves, sun glasses (UVA+UVB protection) and wide brim hats (4-inch brim around the entire circumference of the hat) are also recommended for sun protection.     Due to recent changes in healthcare laws, you may see results of your pathology and/or laboratory studies on MyChart before the doctors have had a chance to review them. We understand that in some cases there may  be results that are confusing or concerning to you. Please understand that not all results are received at the same time and often the doctors may need to interpret multiple results in order to provide you with the best plan of care or course of treatment. Therefore, we ask that you please give [place] 2 business days to thoroughly review all your results before contacting the office for clarification. Should we see a critical lab result, you will be contacted sooner.   If You Need Anything After Your Visit  If you have any questions or concerns for your doctor, please call our main line at [phone] and press  option 4 to reach your doctor's medical assistant. If no one answers, please leave a voicemail as directed and we will return your call as soon as possible. Messages left after 4 pm will be answered the following business day.   You may also send [place] a message via [place]. We typically respond to MyChart messages within 1-2 business days.  For prescription refills, please ask your pharmacy to contact our office. Our fax number is [phone].  If you have an urgent issue when the clinic is closed that cannot wait until the next business day, you can page your doctor at the number below.    Please note that while we do our best to be available for urgent issues outside of office hours, we are not available 24/7.   If you have an urgent issue and are unable to reach [place], you may choose to seek medical care at your doctor's office, retail clinic, urgent care center, or emergency room.  If you have a medical emergency, please immediately call 911 or go to the emergency department.  Pager Numbers  - Dr. Nehemiah Massed: [phone]  - Dr. Laurence Ferrari: [phone]  - Dr. Nicole Kindred: [phone]  In the event of inclement weather, please call our main line at [phone] for an update on the status of any delays or closures.  Dermatology Medication Tips: Please keep the boxes that topical medications come in in order to help keep track of the instructions about where and how to use these. Pharmacies typically print the medication instructions only on the boxes and not directly on the medication tubes.   If your medication is too expensive, please contact our office at [phone] option 4 or send [place] a message through [place].   We are unable to tell what your co-pay for medications will be in advance as this is different depending on your insurance coverage. However, we may be able to find a substitute medication at lower cost or fill out paperwork to get insurance to cover a needed medication.   If a  prior authorization is required to get your medication covered by your insurance company, please allow [place] 1-2 business days to complete this process.  Drug prices often vary depending on where the prescription is filled and some pharmacies may offer cheaper prices.  The website www.goodrx.com contains coupons for medications through different pharmacies. The prices here do not account for what the cost may be with help from insurance (it may be cheaper with your insurance), but the website can give you the price if you did not use any insurance.  - You can print the associated coupon and take it with your prescription to the pharmacy.  - You may also stop by our office during regular business hours and pick up a GoodRx coupon card.  - If you need your prescription sent electronically to a  different pharmacy, notify our office through [facility] or by phone at [phone] option 4.     Si Usted Necesita Algo Despus de Su Visita  Tambin puede enviarnos un mensaje a travs de Pharmacist, community. Por lo general respondemos a los mensajes de MyChart en el transcurso de 1 a 2 das hbiles.  Para renovar recetas, por favor pida a su farmacia que se ponga en contacto con nuestra oficina. Harland Dingwall de fax es [place] [phone].  Si tiene un asunto urgente cuando la clnica est cerrada y que no puede esperar hasta el siguiente da hbil, puede llamar/localizar a su doctor(a) al nmero que aparece a continuacin.   Por favor, tenga en cuenta que aunque hacemos todo lo posible para estar disponibles para asuntos urgentes fuera del horario de [place], no estamos disponibles las 24 horas del da, los 7 das de la [place].   Si tiene un problema urgente y no puede comunicarse con nosotros, puede optar por buscar atencin mdica  en el consultorio de su doctor(a), en una clnica privada, en un centro de atencin urgente o en una sala de emergencias.  Si tiene Engineering geologist, por favor llame  inmediatamente al 911 o vaya a la sala de emergencias.  Nmeros de bper  - Dr. Nehemiah Massed: [phone]  - Dra. Moye: [phone]  - Dra. Nicole Kindred: [phone]  En caso de inclemencias del [place], por favor llame a Johnsie Kindred principal al [phone] para una actualizacin sobre el [place] de cualquier retraso o cierre.  Consejos para la medicacin en dermatologa: Por favor, guarde las cajas en las que vienen los medicamentos de uso tpico para ayudarle a seguir las instrucciones sobre dnde y cmo usarlos. Las farmacias generalmente imprimen las instrucciones del medicamento slo en las cajas y no directamente en los tubos del [place].   Si su medicamento es muy caro, por favor, pngase en contacto con Zigmund Daniel llamando al [phone] y presione la opcin 4 o envenos un mensaje a travs de Pharmacist, community.   No podemos decirle cul ser su copago por los medicamentos por adelantado ya que esto es diferente dependiendo de la cobertura de su seguro. Sin embargo, es posible que podamos encontrar un medicamento sustituto a Electrical engineer un formulario para que el seguro cubra el medicamento que se considera necesario.   Si se requiere una autorizacin previa para que su compaa de seguros [place] su medicamento, por favor permtanos de 1 a 2 das hbiles para completar este proceso.  Los precios de los medicamentos varan con frecuencia dependiendo del Environmental consultant de dnde se surte la receta y alguna farmacias pueden ofrecer precios ms baratos.  El sitio web www.goodrx.com tiene cupones para medicamentos de Airline pilot. Los precios aqu no tienen en cuenta lo que podra costar con la ayuda del seguro (puede ser ms barato con su seguro), pero el sitio web puede darle el precio si no utiliz Research scientist (physical sciences).  - Puede imprimir el cupn correspondiente y llevarlo con su receta a la farmacia.  - Tambin puede pasar por nuestra oficina durante el horario de atencin regular y Charity fundraiser  una tarjeta de cupones de GoodRx.  - Si necesita que su receta se enve electrnicamente a una farmacia diferente, informe a nuestra oficina a travs de MyChart de [place] o por telfono llamando al [phone] y presione la opcin 4.

## 2022-05-08 ENCOUNTER — Other Ambulatory Visit: Payer: Self-pay | Admitting: Cardiovascular Disease

## 2022-05-08 ENCOUNTER — Other Ambulatory Visit: Payer: Self-pay | Admitting: Internal Medicine

## 2022-05-09 ENCOUNTER — Telehealth: Payer: Self-pay | Admitting: Cardiovascular Disease

## 2022-05-09 NOTE — Telephone Encounter (Signed)
*  STAT* If patient is at the pharmacy, call can be transferred to refill team.   1. Which medications need to be refilled? (please list name of each medication and dose if known) carvedilol (COREG) 6.25 MG tablet   2. Which pharmacy/location (including street and city if local pharmacy) is medication to be sent to?  [id number] [place], [place] - [address]   3. Do they need a 30 day or 90 day supply? [address]

## 2022-05-09 NOTE — Telephone Encounter (Signed)
Unable to refill per protocol, last refill by another provider.  Will refuse duplicate request.  Requested Prescriptions  Pending Prescriptions Disp Refills   baclofen (LIORESAL) 10 MG tablet [Pharmacy Med Name: BACLOFEN 10 MG TABLET] 270 tablet 0    Sig: TAKE 1 TABLET BY MOUTH THREE TIMES A DAY     Analgesics:  Muscle Relaxants - baclofen Failed - 05/08/2022  6:27 AM      Failed - Cr in normal range and within 180 days    Creatinine, Ser  Date Value Ref Range Status  03/15/2022 1.29 (H) 0.44 - 1.00 mg/dL Final         Passed - eGFR is 30 or above and within 180 days    GFR calc Af Amer  Date Value Ref Range Status  02/01/2020 >60 >60 mL/min Final   GFR, Estimated  Date Value Ref Range Status  03/15/2022 44 (L) >60 mL/min Final    Comment:    (NOTE) Calculated using the CKD-EPI Creatinine Equation (2021)    eGFR  Date Value Ref Range Status  06/22/2021 61 >59 mL/min/1.73 Final         Passed - Valid encounter within last 6 months    Recent Outpatient Visits           1 month ago Black stools   [place] [facility] at [facility], Jesse Sans, MD   10 months ago Essential hypertension   [place] [facility] at [facility], Jesse Sans, MD   2 years ago Essential hypertension   [place] [facility] Medicine at [facility], Jesse Sans, MD   2 years ago Diarrhea, unspecified type   [facility] at [facility], Jesse Sans, MD   2 years ago Essential hypertension   [place] at [facility], Jesse Sans, MD       Future Appointments             In 1 month Army Melia, Jesse Sans, MD [facility] Primary Care and Sports Medicine at [facility], [place]   In 5 months [place], Kathlene November, MD [place]. [place]

## 2022-05-09 NOTE — Telephone Encounter (Signed)
carvedilol (COREG) 6.25 MG tablet 180 tablet 3 04/08/2022    Sig - Route: Take 1 tablet (6.25 mg total) by mouth 2 (two) times daily with a meal. - Oral   Sent to pharmacy as: carvedilol (COREG) 6.25 MG tablet   E-Prescribing Status: Receipt confirmed by pharmacy (04/08/2022  2:25 PM EST)    Pharmacy  [id number] [place], [place]

## 2022-05-09 NOTE — Telephone Encounter (Signed)
Call attempted to let patient know that her prescription was sent to pharmacy and she just needs to have them refill it. Got voicemail-no [facility] DPR on file.

## 2022-05-31 ENCOUNTER — Other Ambulatory Visit: Payer: Self-pay | Admitting: Cardiovascular Disease

## 2022-05-31 ENCOUNTER — Other Ambulatory Visit: Payer: Self-pay | Admitting: Internal Medicine

## 2022-05-31 DIAGNOSIS — E782 Mixed hyperlipidemia: Secondary | ICD-10-CM

## 2022-06-01 ENCOUNTER — Telehealth: Payer: Self-pay | Admitting: Cardiovascular Disease

## 2022-06-01 NOTE — Telephone Encounter (Signed)
Please review. Pt got this from ER?  KP

## 2022-06-01 NOTE — Telephone Encounter (Signed)
Pt c/o medication issue:  1. Name of Medication:  carvedilol (COREG) 6.25 MG tablet   2. How are you currently taking this medication (dosage and times per day)? Half a tablet twice a day  3. Are you having a reaction (difficulty breathing--STAT)? no  4. What is your medication issue? Patient states she was told to cut her tablets in half. She requests the new prescription be sent to the pharmacy.

## 2022-06-01 NOTE — Telephone Encounter (Signed)
Spoke with patient and reviewed that she should be taking Carvedilol 6.25 mg twice a day and prescription was sent in back on 04/08/22 with 180 pills and 3 refills. She was appreciative for the call back with no further questions at this time.

## 2022-06-01 NOTE — Telephone Encounter (Signed)
Requested Prescriptions  Pending Prescriptions Disp Refills   baclofen (LIORESAL) 10 MG tablet [Pharmacy Med Name: BACLOFEN 10 MG TABLET] 270 tablet 0    Sig: TAKE 1 TABLET BY MOUTH THREE TIMES A DAY     Analgesics:  Muscle Relaxants - baclofen Failed - 05/31/2022 11:13 AM      Failed - Cr in normal range and within 180 days    Creatinine, Ser  Date Value Ref Range Status  03/15/2022 1.29 (H) 0.44 - 1.00 mg/dL Final         Passed - eGFR is 30 or above and within 180 days    GFR calc Af Amer  Date Value Ref Range Status  02/01/2020 >60 >60 mL/min Final   GFR, Estimated  Date Value Ref Range Status  03/15/2022 44 (L) >60 mL/min Final    Comment:    (NOTE) Calculated using the CKD-EPI Creatinine Equation (2021)    eGFR  Date Value Ref Range Status  06/22/2021 61 >59 mL/min/1.73 Final         Passed - Valid encounter within last 6 months    Recent Outpatient Visits           2 months ago Black stools   [place] [facility] at [facility], Jesse Sans, MD   11 months ago Essential hypertension   [place] [facility] Care and Sports Medicine at [facility], Jesse Sans, MD   2 years ago Essential hypertension   [place] Primary Care and Sports Medicine at [facility], Jesse Sans, MD   2 years ago Diarrhea, unspecified type   [facility] and [facility] Medicine at [facility], Jesse Sans, MD   2 years ago Essential hypertension   [place] Primary Care and Sports Medicine at [facility], Jesse Sans, MD       Future Appointments             In 3 weeks [place], Jesse Sans, MD [facility] Primary Care and Sports Medicine at [facility], [facility]   In 4 months [place], Kathlene November, MD [place]. [facility]             meloxicam (MOBIC) 15 MG tablet [Pharmacy Med Name: MELOXICAM 15 MG TABLET] 90 tablet 0    Sig: TAKE 1 TABLET BY MOUTH EVERY  DAY AS NEEDED FOR PAIN     Analgesics:  COX2 Inhibitors Failed - 05/31/2022 11:13 AM      Failed - Manual Review: Labs are only required if the patient has taken medication for more than 8 weeks.      Failed - HGB in normal range and within 360 days    Hemoglobin  Date Value Ref Range Status  03/28/2022 16.1 (H) 11.1 - 15.9 g/dL Final         Failed - Cr in normal range and within 360 days    Creatinine, Ser  Date Value Ref Range Status  03/15/2022 1.29 (H) 0.44 - 1.00 mg/dL Final         Failed - HCT in normal range and within 360 days    Hematocrit  Date Value Ref Range Status  03/28/2022 47.9 (H) 34.0 - 46.6 % Final         Passed - AST in normal range and within 360 days    AST  Date Value Ref Range Status  03/15/2022 23 15 -  41 U/L Final         Passed - ALT in normal range and within 360 days    ALT  Date Value Ref Range Status  03/15/2022 19 0 - 44 U/L Final         Passed - eGFR is 30 or above and within 360 days    GFR calc Af Amer  Date Value Ref Range Status  02/01/2020 >60 >60 mL/min Final   GFR, Estimated  Date Value Ref Range Status  03/15/2022 44 (L) >60 mL/min Final    Comment:    (NOTE) Calculated using the CKD-EPI Creatinine Equation (2021)    eGFR  Date Value Ref Range Status  06/22/2021 61 >59 mL/min/1.73 Final         Passed - Patient is not pregnant      Passed - Valid encounter within last 12 months    Recent Outpatient Visits           2 months ago Black stools   [place] [facility] Care [facility] Sports Medicine at [facility] Jesse Sans, MD   11 months ago Essential hypertension   [place] Primary Care and Sports Medicine at [facility], Jesse Sans, MD   2 years ago Essential hypertension   [place] Primary Care and Sports Medicine at [facility], Jesse Sans, MD   2 years ago Diarrhea, unspecified type   [facility] at [facility], Jesse Sans, MD    2 years ago Essential hypertension   [place] at [facility], Jesse Sans, MD       Future Appointments             In 3 weeks [place], Jesse Sans, MD [facility] Primary Care and Sports Medicine at [facility], [facility]   In 4 months [place], Kathlene November, MD [place]. [facility]             ezetimibe (ZETIA) 10 MG tablet [Pharmacy Med Name: EZETIMIBE 10 MG TABLET] 90 tablet 0    Sig: TAKE 1 TABLET BY MOUTH EVERY DAY     Cardiovascular:  Antilipid - Sterol Transport Inhibitors Failed - 05/31/2022 11:13 AM      Failed - Lipid Panel in normal range within the last 12 months    Cholesterol, Total  Date Value Ref Range Status  06/22/2021 226 (H) 100 - 199 mg/dL Final   LDL Chol Calc (NIH)  Date Value Ref Range Status  06/22/2021 136 (H) 0 - 99 mg/dL Final   HDL  Date Value Ref Range Status  06/22/2021 66 >39 mg/dL Final   Triglycerides  Date Value Ref Range Status  06/22/2021 136 0 - 149 mg/dL Final         Passed - AST in normal range and within 360 days    AST  Date Value Ref Range Status  03/15/2022 23 15 - 41 U/L Final         Passed - ALT in normal range and within 360 days    ALT  Date Value Ref Range Status  03/15/2022 19 0 - 44 U/L Final         Passed - Patient is not pregnant      Passed - Valid encounter within last 12 months    Recent Outpatient Visits  2 months ago Black stools   [place] [facility] and Sports Medicine at [facility], Jesse Sans, MD   11 months ago Essential hypertension   [place] Primary Care and Sports Medicine at [facility], Jesse Sans, MD   2 years ago Essential hypertension   [place] Primary Care and Sports Medicine at [facility], Jesse Sans, MD   2 years ago Diarrhea, unspecified type   [facility] Primary Care and Sports Medicine at [facility], Jesse Sans, MD   2 years  ago Essential hypertension    Primary Care and Sports Medicine at [facility], Jesse Sans, MD       Future Appointments             In 3 weeks [place], Jesse Sans, MD [facility] Health Primary Care and Sports Medicine at [facility], [facility]   In 4 months [place], Kathlene November, MD [place]. [place]

## 2022-06-01 NOTE — Telephone Encounter (Signed)
Requested medication (s) are due for refill today:routing for review  Requested medication (s) are on the active medication list: yes  Last refill:  02/13/22  Future visit scheduled yes  Notes to clinic:  Unable to refill per protocol, last refill by another provider.      Requested Prescriptions  Pending Prescriptions Disp Refills   baclofen (LIORESAL) 10 MG tablet [Pharmacy Med Name: BACLOFEN 10 MG TABLET] 270 tablet 0    Sig: TAKE 1 TABLET BY MOUTH THREE TIMES A DAY     Analgesics:  Muscle Relaxants - baclofen Failed - 05/31/2022 11:13 AM      Failed - Cr in normal range and within 180 days    Creatinine, Ser  Date Value Ref Range Status  03/15/2022 1.29 (H) 0.44 - 1.00 mg/dL Final         Passed - eGFR is 30 or above and within 180 days    GFR calc Af Amer  Date Value Ref Range Status  02/01/2020 >60 >60 mL/min Final   GFR, Estimated  Date Value Ref Range Status  03/15/2022 44 (L) >60 mL/min Final    Comment:    (NOTE) Calculated using the CKD-EPI Creatinine Equation (2021)    eGFR  Date Value Ref Range Status  06/22/2021 61 >59 mL/min/1.73 Final         Passed - Valid encounter within last 6 months    Recent Outpatient Visits           2 months ago Black stools   [place] [facility] Medicine at [facility], Jesse Sans, MD   11 months ago Essential hypertension   [place] Primary Care and Sports Medicine at [facility], Jesse Sans, MD   2 years ago Essential hypertension   [place] Primary Care and Sports Medicine at [facility], Jesse Sans, MD   2 years ago Diarrhea, unspecified type   [facility] Primary Care and Sports Medicine at [facility], Jesse Sans, MD   2 years ago Essential hypertension   [place] Primary Care and Sports Medicine at [facility], Jesse Sans, MD       Future Appointments             In 3 weeks [place], Jesse Sans, MD [facility] Primary Care and  Sports Medicine at [facility], [facility]   In 4 months [place], Kathlene November, MD [place]. [facility]            Signed Prescriptions Disp Refills   meloxicam (MOBIC) 15 MG tablet 90 tablet 0    Sig: TAKE 1 TABLET BY MOUTH EVERY DAY AS NEEDED FOR PAIN     Analgesics:  COX2 Inhibitors Failed - 05/31/2022 11:13 AM      Failed - Manual Review: Labs are only required if the patient has taken medication for more than 8 weeks.      Failed - HGB in normal range and within 360 days    Hemoglobin  Date Value Ref Range Status  03/28/2022 16.1 (H) 11.1 - 15.9 g/dL Final         Failed - Cr in normal range and within 360 days    Creatinine, Ser  Date Value Ref Range Status  03/15/2022 1.29 (H) 0.44 - 1.00 mg/dL Final         Failed - HCT in normal range and within 360 days  Hematocrit  Date Value Ref Range Status  03/28/2022 47.9 (H) 34.0 - 46.6 % Final         Passed - AST in normal range and within 360 days    AST  Date Value Ref Range Status  03/15/2022 23 15 - 41 U/L Final         Passed - ALT in normal range and within 360 days    ALT  Date Value Ref Range Status  03/15/2022 19 0 - 44 U/L Final         Passed - eGFR is 30 or above and within 360 days    GFR calc Af Amer  Date Value Ref Range Status  02/01/2020 >60 >60 mL/min Final   GFR, Estimated  Date Value Ref Range Status  03/15/2022 44 (L) >60 mL/min Final    Comment:    (NOTE) Calculated using the CKD-EPI Creatinine Equation (2021)    eGFR  Date Value Ref Range Status  06/22/2021 61 >59 mL/min/1.73 Final         Passed - Patient is not pregnant      Passed - Valid encounter within last 12 months    Recent Outpatient Visits           2 months ago Black stools   [place] Primary Care and Sports Medicine at [facility], Jesse Sans, MD   11 months ago Essential hypertension   [place] Primary Care and Sports Medicine at [facility], Jesse Sans, MD   2 years ago Essential hypertension   [place] Primary Care and Sports Medicine at [facility], Jesse Sans, MD   2 years ago Diarrhea, unspecified type   [facility] Primary Care and Sports Medicine at [facility], Jesse Sans, MD   2 years ago Essential hypertension   [place] Primary Care and Sports Medicine at [facility], Jesse Sans, MD       Future Appointments             In 3 weeks [place], Jesse Sans, MD [facility] Primary Care and Sports Medicine at [facility], [facility]   In 4 months [place], Kathlene November, MD [place]. [facility]             ezetimibe (ZETIA) 10 MG tablet 90 tablet 0    Sig: TAKE 1 TABLET BY MOUTH EVERY DAY     Cardiovascular:  Antilipid - Sterol Transport Inhibitors Failed - 05/31/2022 11:13 AM      Failed - Lipid Panel in normal range within the last 12 months    Cholesterol, Total  Date Value Ref Range Status  06/22/2021 226 (H) 100 - 199 mg/dL Final   LDL Chol Calc (NIH)  Date Value Ref Range Status  06/22/2021 136 (H) 0 - 99 mg/dL Final   HDL  Date Value Ref Range Status  06/22/2021 66 >39 mg/dL Final   Triglycerides  Date Value Ref Range Status  06/22/2021 136 0 - 149 mg/dL Final         Passed - AST in normal range and within 360 days    AST  Date Value Ref Range Status  03/15/2022 23 15 - 41 U/L Final         Passed - ALT in normal range and within 360 days    ALT  Date Value Ref Range Status  03/15/2022 19 0 - 44 U/L Final  Passed - Patient is not pregnant      Passed - Valid encounter within last 12 months    Recent Outpatient Visits           2 months ago Black stools   [place] Primary Care and Sports Medicine at [facility], Jesse Sans, MD   11 months ago Essential hypertension   [place] Primary Care and Sports Medicine at [facility], Jesse Sans, MD   2 years ago Essential  hypertension   [place] Primary Care and Sports Medicine at [facility], Jesse Sans, MD   2 years ago Diarrhea, unspecified type   [facility] Primary Care and Sports Medicine at [facility], Jesse Sans, MD   2 years ago Essential hypertension   [place] Primary Care and Sports Medicine at [facility], Jesse Sans, MD       Future Appointments             In 3 weeks [place] Jesse Sans, MD [facility] Primary Care and Sports Medicine at [facility], [facility]   In 4 months [place], Kathlene November, [place]. [place]

## 2022-06-08 ENCOUNTER — Emergency Department
Admission: EM | Admit: 2022-06-08 | Discharge: 2022-06-08 | Disposition: A | Discharge status: Home / Self Care | Payer: Medicare Other | Attending: Emergency Medicine | Admitting: Emergency Medicine

## 2022-06-08 ENCOUNTER — Encounter: Payer: Self-pay | Admitting: Emergency Medicine

## 2022-06-08 ENCOUNTER — Emergency Department: Payer: Medicare Other

## 2022-06-08 DIAGNOSIS — M9902 Segmental and somatic dysfunction of thoracic region: Secondary | ICD-10-CM | POA: Diagnosis not present

## 2022-06-08 DIAGNOSIS — M9901 Segmental and somatic dysfunction of cervical region: Secondary | ICD-10-CM | POA: Diagnosis not present

## 2022-06-08 DIAGNOSIS — S0181XA Laceration without foreign body of other part of head, initial encounter: Secondary | ICD-10-CM

## 2022-06-08 DIAGNOSIS — M9903 Segmental and somatic dysfunction of lumbar region: Secondary | ICD-10-CM | POA: Diagnosis not present

## 2022-06-08 DIAGNOSIS — I10 Essential (primary) hypertension: Secondary | ICD-10-CM | POA: Diagnosis not present

## 2022-06-08 DIAGNOSIS — S01411A Laceration without foreign body of right cheek and temporomandibular area, initial encounter: Secondary | ICD-10-CM | POA: Diagnosis not present

## 2022-06-08 DIAGNOSIS — Z23 Encounter for immunization: Secondary | ICD-10-CM | POA: Diagnosis not present

## 2022-06-08 DIAGNOSIS — W19XXXA Unspecified fall, initial encounter: Secondary | ICD-10-CM | POA: Insufficient documentation

## 2022-06-08 DIAGNOSIS — M5451 Vertebrogenic low back pain: Secondary | ICD-10-CM | POA: Diagnosis not present

## 2022-06-08 DIAGNOSIS — S0003XA Contusion of scalp, initial encounter: Secondary | ICD-10-CM | POA: Diagnosis not present

## 2022-06-08 DIAGNOSIS — R58 Hemorrhage, not elsewhere classified: Secondary | ICD-10-CM | POA: Diagnosis not present

## 2022-06-08 DIAGNOSIS — I6381 Other cerebral infarction due to occlusion or stenosis of small artery: Secondary | ICD-10-CM | POA: Diagnosis not present

## 2022-06-08 DIAGNOSIS — S0990XA Unspecified injury of head, initial encounter: Secondary | ICD-10-CM | POA: Diagnosis not present

## 2022-06-08 MED ORDER — TETANUS-DIPHTH-ACELL PERTUSSIS 5-2.5-18.5 LF-MCG/0.5 IM SUSY
0.5000 mL | PREFILLED_SYRINGE | Freq: Once | INTRAMUSCULAR | Status: AC
  Administered 2022-06-08: 0.5 mL via INTRAMUSCULAR
  Filled 2022-06-08: qty 0.5

## 2022-06-08 MED ORDER — LIDOCAINE-EPINEPHRINE (PF) 2 %-1:200000 IJ SOLN
20.0000 mL | Freq: Once | INTRAMUSCULAR | Status: AC
  Administered 2022-06-08: 20 mL via INTRADERMAL
  Filled 2022-06-08: qty 20

## 2022-06-08 NOTE — Discharge Instructions (Signed)
Do not get the sutured area wet for 24 hours. After 24 hours, shower/bathe as usual and pat the area dry. Change the bandage 2 times per day and apply antibiotic ointment. Leave open to air when at no risk of getting the area dirty, but cover at night before bed. See your PCP or go to Urgent Care in 7 days for suture removal or sooner for signs or concern of infection.

## 2022-06-08 NOTE — ED Triage Notes (Signed)
Pt presents via EMS from home with complaints fall tonight. Pt states that she slipped and hit her head on a wooden cabinet. Pt has a 2 inch laceration to the R cheek, bleeding controlled with gauze and curlex. Denies LOC, not on thinners.   VS with EMS 94 HR 96%  148/88

## 2022-06-08 NOTE — ED Provider Notes (Signed)
[facility] Provider Note    Event Date/Time   First MD Initiated Contact with Patient 06/08/22 2024     (approximate)   History   Fall   HPI  Audrey Martinez is a 77 y.o. female with history of atrial flutter, renal insufficiency, hypertension and, CVA, and as listed in the EMR presents to the emergency department after a mechanical, nonsyncopal fall at home.  She states that she believes that her foot got caught on a rug causing her to fall forward and hit the corner of her curio cabinet.  She has a facial laceration.  No loss of consciousness.     Physical Exam   Triage Vital Signs: ED Triage Vitals [06/08/22 1938]  Enc Vitals Group     BP (!) 121/90     Pulse Rate 91     Resp 18     Temp 97.9 F (36.6 C)     Temp Source Oral     SpO2 100 %     Weight 230 lb (104.3 kg)     Height '5\' 6"'$  (1.676 m)     Head Circumference      Peak Flow      Pain Score 8     Pain Loc      Pain Edu?      Excl. in [place]?     Most recent vital signs: Vitals:   06/08/22 1938  BP: (!) 121/90  Pulse: 91  Resp: 18  Temp: 97.9 F (36.6 C)  SpO2: 100%     General: Awake, no distress.  CV:  Good peripheral perfusion.  Resp:  Normal effort.  Abd:  No distention.  Other:  8cm and deep laceration to the right cheek extending in irregular pattern to angle of jaw.   ED Results / Procedures / Treatments   Labs (all labs ordered are listed, but only abnormal results are displayed) Labs Reviewed - No data to display   EKG  Not indicated.   RADIOLOGY  CT head and maxillofacial negative for acute findings with the exception of facial laceration.   PROCEDURES:  Critical Care performed: No  ..Laceration Repair  Date/Time: 06/08/2022 11:08 PM  Performed by: Victorino Dike, FNP Authorized by: Victorino Dike, FNP   Consent:    Consent obtained:  Verbal   Consent given by:  Patient   Risks discussed:  Infection, pain and poor cosmetic  result Universal protocol:    Procedure explained and questions answered to patient or proxy's satisfaction: yes     Relevant documents present and verified: yes     Test results available: yes     Imaging studies available: yes     Required blood products, implants, devices, and special equipment available: yes     Patient identity confirmed:  Verbally with patient Anesthesia:    Anesthesia method:  Local infiltration   Local anesthetic:  Lidocaine 2% WITH epi Laceration details:    Location:  Face   Face location:  R cheek   Length (cm):  9 Pre-procedure details:    Preparation:  Patient was prepped and draped in usual sterile fashion Exploration:    Hemostasis achieved with:  Epinephrine   Wound exploration: entire depth of wound visualized   Treatment:    Area cleansed with:  Chlorhexidine and saline   Amount of cleaning:  Standard   Irrigation solution:  Sterile saline   Irrigation method:  Syringe   Debridement:  Minimal  Layers/structures repaired:  Deep subcutaneous Deep subcutaneous:    Suture size:  6-0   Suture material:  Vicryl   Suture technique:  Running   Number of sutures:  8 Skin repair:    Repair method:  Sutures   Suture size:  6-0   Suture material:  Prolene   Suture technique:  Simple interrupted   Number of sutures:  15 Approximation:    Approximation:  Close Repair type:    Repair type:  Complex Post-procedure details:    Dressing:  Non-adherent dressing and sterile dressing   Procedure completion:  Tolerated well, no immediate complications    MEDICATIONS ORDERED IN ED: Medications  Tdap (BOOSTRIX) injection 0.5 mL (has no administration in time range)  lidocaine-EPINEPHrine (XYLOCAINE W/EPI) 2 %-1:200000 (PF) injection 20 mL (20 mLs Intradermal Given by Other 06/08/22 2234)     IMPRESSION / MDM / [place] / ED COURSE  I reviewed the triage vital signs and the nursing notes.                              Differential  diagnosis includes, but is not limited to, subdural hematoma, maxillofacial bone fracture, facial laceration, facial nerve damage, vascular injury.  Patient's presentation is most consistent with acute presentation with potential threat to life or bodily function.  77 year old female presenting to the emergency department after mechanical, nonsyncopal fall causing her to strike her face on the edge/corner of a curio cabinet and sustaining a deep and approximate 8 cm laceration to the right cheek in an irregular pattern to the angle of the right lower jaw.  Wound does not proceed through the inner buccal surface.  CT images of the head and maxillofacial bones are negative for acute concerns.  Wound was cleaned and repaired as described above.  Patient tolerated the procedure well.  Home care discussed and will be provided in writing on her discharge papers.  She will see her primary care provider or go to urgent care in about 1 week for suture removal.  She was encouraged to be evaluated sooner if she has any sign or concern of infection.     FINAL CLINICAL IMPRESSION(S) / ED DIAGNOSES   Final diagnoses:  Facial laceration, initial encounter     Rx / DC Orders   ED Discharge Orders     None        Note:  This document was prepared using Dragon voice recognition software and may include unintentional dictation errors.   Victorino Dike, FNP 06/08/22 2312    Lucillie Garfinkel, MD 06/09/22 2330

## 2022-06-10 ENCOUNTER — Telehealth: Payer: Self-pay

## 2022-06-10 ENCOUNTER — Other Ambulatory Visit (HOSPITAL_COMMUNITY): Payer: Self-pay

## 2022-06-10 NOTE — Telephone Encounter (Signed)
Pa request received via CMM for Fluticasone Propionate HFA 110MCG/ACT aerosol  PA not submitted due to no documentation of trial to [employer] or [employer].   Pending additional information from provider.   Key: Audrey Martinez

## 2022-06-10 NOTE — Telephone Encounter (Signed)
Dr. Mortimer Fries, please advise. Thanks

## 2022-06-14 NOTE — Telephone Encounter (Signed)
Dr. Mortimer Fries, please advise. Thanks

## 2022-06-15 NOTE — Telephone Encounter (Addendum)
Lm for patient.  

## 2022-06-16 ENCOUNTER — Ambulatory Visit (INDEPENDENT_AMBULATORY_CARE_PROVIDER_SITE_OTHER): Payer: Medicare Other | Admitting: Family Medicine

## 2022-06-16 ENCOUNTER — Other Ambulatory Visit: Payer: Self-pay | Admitting: Internal Medicine

## 2022-06-16 ENCOUNTER — Other Ambulatory Visit: Payer: Self-pay | Admitting: Cardiovascular Disease

## 2022-06-16 ENCOUNTER — Encounter: Payer: Self-pay | Admitting: Family Medicine

## 2022-06-16 ENCOUNTER — Telehealth: Payer: Self-pay | Admitting: Internal Medicine

## 2022-06-16 VITALS — BP 128/68 | HR 112 | Ht 66.0 in | Wt 230.0 lb

## 2022-06-16 DIAGNOSIS — E782 Mixed hyperlipidemia: Secondary | ICD-10-CM

## 2022-06-16 DIAGNOSIS — S0181XA Laceration without foreign body of other part of head, initial encounter: Secondary | ICD-10-CM | POA: Insufficient documentation

## 2022-06-16 DIAGNOSIS — J4520 Mild intermittent asthma, uncomplicated: Secondary | ICD-10-CM

## 2022-06-16 DIAGNOSIS — S0181XD Laceration without foreign body of other part of head, subsequent encounter: Secondary | ICD-10-CM | POA: Diagnosis not present

## 2022-06-16 HISTORY — DX: Laceration without foreign body of other part of head, initial encounter: S01.81XA

## 2022-06-16 NOTE — Patient Instructions (Signed)
-  Continue gentle skin care - Can consider Mederma for scar reduction - Follow-up as-needed

## 2022-06-16 NOTE — Telephone Encounter (Signed)
PA has been resubmitted with additional information and is pending determination.  Key: [id number]

## 2022-06-16 NOTE — Telephone Encounter (Signed)
Please see 1/12 phone note.  Patient is aware that I have spoken to her since voicemail was left yesterday. Advised patient that we will contact her with PA determination.  She voiced her understanding.  Nothing further needed.

## 2022-06-16 NOTE — Assessment & Plan Note (Signed)
Patient presents for follow-up to right facial laceration sustained on 06/08/2022 and nonsyncopal mechanical fall addressed same day at [facility] ED with multiple sutures including 15 superficial sutures.  She presents specifically for suture removal.  She denies any facial pain, has been performing home wound care without issue.  Examination with varying degrees of resolving ecchymosis diffusely about the right face, cranial nerves II-XII grossly intact and she is alert, awake, oriented x 3.  She tolerated suture removal well without issue, post removal care reviewed as well as recommendation for Mederma.  She can follow-up as needed.

## 2022-06-16 NOTE — Telephone Encounter (Signed)
Requested Prescriptions  Pending Prescriptions Disp Refills   baclofen (LIORESAL) 10 MG tablet [Pharmacy Med Name: BACLOFEN 10 MG TABLET] 270 tablet 0    Sig: TAKE 1 TABLET BY MOUTH THREE TIMES A DAY     Analgesics:  Muscle Relaxants - baclofen Failed - 06/16/2022 12:38 PM      Failed - Cr in normal range and within 180 days    Creatinine, Ser  Date Value Ref Range Status  03/15/2022 1.29 (H) 0.44 - 1.00 mg/dL Final         Passed - eGFR is 30 or above and within 180 days    GFR calc Af Amer  Date Value Ref Range Status  02/01/2020 >60 >60 mL/min Final   GFR, Estimated  Date Value Ref Range Status  03/15/2022 44 (L) >60 mL/min Final    Comment:    (NOTE) Calculated using the CKD-EPI Creatinine Equation (2021)    eGFR  Date Value Ref Range Status  06/22/2021 61 >59 mL/min/1.73 Final         Passed - Valid encounter within last 6 months    Recent Outpatient Visits           Today Facial laceration, subsequent encounter   [place] Primary Care and Sports Medicine at [place], Earley Abide, MD   2 months ago Black stools   [place] Primary Care and Sports Medicine at [facility], Jesse Sans, MD   11 months ago Essential hypertension   [place] Primary Care and Sports Medicine at [facility], Jesse Sans, MD   2 years ago Essential hypertension   [place] Primary Care and Sports Medicine at [facility], Jesse Sans, MD   2 years ago Diarrhea, unspecified type   [facility] Primary Care and Sports Medicine at [facility], Jesse Sans, MD       Future Appointments             In 1 week [place] Mel[place], Jesse Sans, MD [facility] Primary Care and Sports Medicine at [facility], [facility]   In 3 months [place], Kathlene November, MD [place]. [facility]            Refused Prescriptions Disp Refills   ezetimibe (ZETIA) 10 MG tablet [Pharmacy Med Name: EZETIMIBE 10 MG TABLET] 90  tablet 0    Sig: TAKE 1 TABLET BY MOUTH EVERY DAY     Cardiovascular:  Antilipid - Sterol Transport Inhibitors Failed - 06/16/2022 12:38 PM      Failed - Lipid Panel in normal range within the last 12 months    Cholesterol, Total  Date Value Ref Range Status  06/22/2021 226 (H) 100 - 199 mg/dL Final   LDL Chol Calc (NIH)  Date Value Ref Range Status  06/22/2021 136 (H) 0 - 99 mg/dL Final   HDL  Date Value Ref Range Status  06/22/2021 66 >39 mg/dL Final   Triglycerides  Date Value Ref Range Status  06/22/2021 136 0 - 149 mg/dL Final         Passed - AST in normal range and within 360 days    AST  Date Value Ref Range Status  03/15/2022 23 15 - 41 U/L Final         Passed - ALT in normal range and within 360 days    ALT  Date Value Ref Range Status  03/15/2022 19 0 - 44 U/L Final  Passed - Patient is not pregnant      Passed - Valid encounter within last 12 months    Recent Outpatient Visits           Today Facial laceration, subsequent encounter   [place] Primary Care and Sports Medicine at [place], Earley Abide, MD   2 months ago Black stools   [facility] and [facility] Medicine at [facility], Jesse Sans, MD   11 months ago Essential hypertension   [place] Primary Care and Sports Medicine at [facility], Jesse Sans, MD   2 years ago Essential hypertension   [place] Primary Care and Sports Medicine at [facility], Jesse Sans, MD   2 years ago Diarrhea, unspecified type   [facility] Primary Care and [facility] at [facility], Jesse Sans, MD       Future Appointments             In 1 week Army Melia, Jesse Sans, MD [facility] Primary Care and Sports Medicine at [facility], [facility]   In 3 months [place], Kathlene November, [place]. [facility]             meloxicam (MOBIC) 15 MG tablet [Pharmacy Med Name: MELOXICAM 15 MG TABLET]  90 tablet 0    Sig: TAKE 1 TABLET BY MOUTH EVERY DAY AS NEEDED FOR PAIN     Analgesics:  COX2 Inhibitors Failed - 06/16/2022 12:38 PM      Failed - Manual Review: Labs are only required if the patient has taken medication for more than 8 weeks.      Failed - HGB in normal range and within 360 days    Hemoglobin  Date Value Ref Range Status  03/28/2022 16.1 (H) 11.1 - 15.9 g/dL Final         Failed - Cr in normal range and within 360 days    Creatinine, Ser  Date Value Ref Range Status  03/15/2022 1.29 (H) 0.44 - 1.00 mg/dL Final         Failed - HCT in normal range and within 360 days    Hematocrit  Date Value Ref Range Status  03/28/2022 47.9 (H) 34.0 - 46.6 % Final         Passed - AST in normal range and within 360 days    AST  Date Value Ref Range Status  03/15/2022 23 15 - 41 U/L Final         Passed - ALT in normal range and within 360 days    ALT  Date Value Ref Range Status  03/15/2022 19 0 - 44 U/L Final         Passed - eGFR is 30 or above and within 360 days    GFR calc Af Amer  Date Value Ref Range Status  02/01/2020 >60 >60 mL/min Final   GFR, Estimated  Date Value Ref Range Status  03/15/2022 44 (L) >60 mL/min Final    Comment:    (NOTE) Calculated using the CKD-EPI Creatinine Equation (2021)    eGFR  Date Value Ref Range Status  06/22/2021 61 >59 mL/min/1.73 Final         Passed - Patient is not pregnant      Passed - Valid encounter within last 12 months    Recent Outpatient Visits           Today Facial  laceration, subsequent encounter   [place] Primary Care and Sports Medicine at [place], Earley Abide, MD   2 months ago Black stools   [facility] Primary Care and Sports Medicine at [facility], Jesse Sans, MD   11 months ago Essential hypertension   [place] Primary Care and Sports Medicine at [facility], Jesse Sans, MD   2 years ago Essential hypertension   [place] Primary Care and  Sports Medicine at [facility], Jesse Sans, MD   2 years ago Diarrhea, unspecified type   [facility] Primary Care and Sports Medicine at [facility], Jesse Sans, MD       Future Appointments             In 1 week [place] Melia, Jesse Sans, MD [facility] Primary Care and Sports Medicine at [facility], [facility]   In 3 months [place], Kathlene November, MD [place]. [place]

## 2022-06-16 NOTE — Progress Notes (Signed)
     Primary Care / Sports Medicine Office Visit  Patient Information:  Patient ID: Audrey Martinez, female DOB: 1946-01-25 Age: 77 y.o. MRN: [id number]   Audrey Martinez is a pleasant 77 y.o. female presenting with the following:  Chief Complaint  Patient presents with   Suture / Staple Removal    Pt fell went to ER got 15-17 stiches in right side of face, no pain.     Vitals:   06/16/22 0859  BP: 128/68  Pulse: (!) 112  SpO2: 97%   Vitals:   06/16/22 0859  Weight: 230 lb (104.3 kg)  Height: '5\' 6"'$  (1.676 m)   Body mass index is 37.12 kg/m.     Independent interpretation of notes and tests performed by another provider:   None  Procedures performed:   Patient presents for suture removal. The wound is well healed without signs of infection.  The sutures are removed (#15 total). Wound care and activity instructions given. Return prn.   Pertinent History, Exam, Impression, and Recommendations:   [place] was seen today for suture / staple removal.  Facial laceration, subsequent encounter Overview: Date of injury: 06/08/2022 [facility] ED history below: 77 year old female presenting to the emergency department after mechanical, nonsyncopal fall causing her to strike her face on the edge/corner of a curio cabinet and sustaining a deep and approximate 8 cm laceration to the right cheek in an irregular pattern to the angle of the right lower jaw. Wound does not proceed through the inner buccal surface.   Assessment & Plan: Patient presents for follow-up to right facial laceration sustained on 06/08/2022 and nonsyncopal mechanical fall addressed same day at [facility] ED with multiple sutures including 15 superficial sutures.  She presents specifically for suture removal.  She denies any facial pain, has been performing home wound care without issue.  Examination with varying degrees of resolving ecchymosis diffusely about the right face, cranial nerves  II-XII grossly intact and she is alert, awake, oriented x 3.  She tolerated suture removal well without issue, post removal care reviewed as well as recommendation for Mederma.  She can follow-up as needed.      Orders & Medications No orders of the defined types were placed in this encounter.  No orders of the defined types were placed in this encounter.    No follow-ups on file.     Montel Culver, MD, [facility]   Primary Care Sports Medicine Primary Care and Sports Medicine at [facility]

## 2022-06-16 NOTE — Telephone Encounter (Signed)
Spoke to patient and relayed below message.  She stated that has tried qvar previously. 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   PA team, please advise. Thanks

## 2022-06-16 NOTE — Telephone Encounter (Signed)
Requested Prescriptions  Pending Prescriptions Disp Refills   ezetimibe (ZETIA) 10 MG tablet [Pharmacy Med Name: EZETIMIBE 10 MG TABLET] 90 tablet 0    Sig: TAKE 1 TABLET BY MOUTH EVERY DAY     Cardiovascular:  Antilipid - Sterol Transport Inhibitors Failed - 06/16/2022 12:38 PM      Failed - Lipid Panel in normal range within the last 12 months    Cholesterol, Total  Date Value Ref Range Status  06/22/2021 226 (H) 100 - 199 mg/dL Final   LDL Chol Calc (NIH)  Date Value Ref Range Status  06/22/2021 136 (H) 0 - 99 mg/dL Final   HDL  Date Value Ref Range Status  06/22/2021 66 >39 mg/dL Final   Triglycerides  Date Value Ref Range Status  06/22/2021 136 0 - 149 mg/dL Final         Passed - AST in normal range and within 360 days    AST  Date Value Ref Range Status  03/15/2022 23 15 - 41 U/L Final         Passed - ALT in normal range and within 360 days    ALT  Date Value Ref Range Status  03/15/2022 19 0 - 44 U/L Final         Passed - Patient is not pregnant      Passed - Valid encounter within last 12 months    Recent Outpatient Visits           Today Facial laceration, subsequent encounter   [place] Primary Care and Sports Medicine at [place], Earley Abide, MD   2 months ago Black stools   [place] Primary Care and Sports Medicine at [facility], Jesse Sans, MD   11 months ago Essential hypertension   [facility] [employer] Primary Care and Sports Medicine at [facility], Jesse Sans, MD   2 years ago Essential hypertension   [place] Primary Care and Sports Medicine at [facility], Jesse Sans, MD   2 years ago Diarrhea, unspecified type   [facility] Primary Care and Sports Medicine at [facility], Jesse Sans, MD       Future Appointments             In 1 week [place] Mel[place], Jesse Sans, MD [facility] Primary Care and Sports Medicine at [facility], [facility]   In 3 months [place], Kathlene November, MD [place]. [facility]             meloxicam (MOBIC) 15 MG tablet [Pharmacy Med Name: MELOXICAM 15 MG TABLET] 90 tablet 0    Sig: TAKE 1 TABLET BY MOUTH EVERY DAY AS NEEDED FOR PAIN     Analgesics:  COX2 Inhibitors Failed - 06/16/2022 12:38 PM      Failed - Manual Review: Labs are only required if the patient has taken medication for more than 8 weeks.      Failed - HGB in normal range and within 360 days    Hemoglobin  Date Value Ref Range Status  03/28/2022 16.1 (H) 11.1 - 15.9 g/dL Final         Failed - Cr in normal range and within 360 days    Creatinine, Ser  Date Value Ref Range Status  03/15/2022 1.29 (H) 0.44 - 1.00 mg/dL Final         Failed - HCT in normal range and within 360 days  Hematocrit  Date Value Ref Range Status  03/28/2022 47.9 (H) 34.0 - 46.6 % Final         Passed - AST in normal range and within 360 days    AST  Date Value Ref Range Status  03/15/2022 23 15 - 41 U/L Final         Passed - ALT in normal range and within 360 days    ALT  Date Value Ref Range Status  03/15/2022 19 0 - 44 U/L Final         Passed - eGFR is 30 or above and within 360 days    GFR calc Af Amer  Date Value Ref Range Status  02/01/2020 >60 >60 mL/min Final   GFR, Estimated  Date Value Ref Range Status  03/15/2022 44 (L) >60 mL/min Final    Comment:    (NOTE) Calculated using the CKD-EPI Creatinine Equation (2021)    eGFR  Date Value Ref Range Status  06/22/2021 61 >59 mL/min/1.73 Final         Passed - Patient is not pregnant      Passed - Valid encounter within last 12 months    Recent Outpatient Visits           Today Facial laceration, subsequent encounter   [place] Primary Care and Sports Medicine at [place], Earley Abide, MD   2 months ago Black stools   [place] Primary Care and Sports Medicine at [facility], Jesse Sans, MD   11 months ago Essential hypertension   [place]  Primary Care and Sports Medicine at [facility], Jesse Sans, MD   2 years ago Essential hypertension   [place] Primary Care and Sports Medicine at [facility], Jesse Sans, MD   2 years ago Diarrhea, unspecified type   [place] at [facility], Jesse Sans, MD       Future Appointments             In 1 week [place] Melia, Jesse Sans, MD [place] at [facility], [facility]   In 3 months [place], Kathlene November, MD [place]. [facility]             baclofen (LIORESAL) 10 MG tablet [Pharmacy Med Name: BACLOFEN 10 MG TABLET] 270 tablet 0    Sig: TAKE 1 TABLET BY MOUTH THREE TIMES A DAY     Analgesics:  Muscle Relaxants - baclofen Failed - 06/16/2022 12:38 PM      Failed - Cr in normal range and within 180 days    Creatinine, Ser  Date Value Ref Range Status  03/15/2022 1.29 (H) 0.44 - 1.00 mg/dL Final         Passed - eGFR is 30 or above and within 180 days    GFR calc Af Amer  Date Value Ref Range Status  02/01/2020 >60 >60 mL/min Final   GFR, Estimated  Date Value Ref Range Status  03/15/2022 44 (L) >60 mL/min Final    Comment:    (NOTE) Calculated using the CKD-EPI Creatinine Equation (2021)    eGFR  Date Value Ref Range Status  06/22/2021 61 >59 mL/min/1.73 Final         Passed - Valid encounter within last 6 months    Recent Outpatient Visits           Today  Facial laceration, subsequent encounter   [place] Primary Care and Sports Medicine at [place], Earley Abide, MD   2 months ago Black stools   [facility] Primary Care and Sports Medicine at [facility], Jesse Sans, MD   11 months ago Essential hypertension   [place] Primary Care and Sports Medicine at [facility], Jesse Sans, MD   2 years ago Essential hypertension   [place] Primary Care and Sports Medicine at [facility], Jesse Sans, MD   2 years ago Diarrhea, unspecified type   [facility] Primary Care and Sports Medicine at [facility], Jesse Sans, MD       Future Appointments             In 1 week [place] Mel[place], Jesse Sans, MD [facility] Primary Care and Sports Medicine at [facility], [facility]   In 3 months [place], Kathlene November, MD [place]. [place]

## 2022-06-17 ENCOUNTER — Telehealth: Payer: Self-pay

## 2022-06-17 NOTE — Telephone Encounter (Signed)
Spoke to patient and relayed below message. She would like to try Arnuity.   Dr. Mortimer Fries, please advise if okay to order Arnuity.

## 2022-06-17 NOTE — Telephone Encounter (Signed)
     Patient  visit on 06/08/2021  at [place] you been able to follow up with your primary care physician? Yes   The patient was or was not able to obtain any needed medicine or equipment. Yes   Are there diet recommendations that you are having difficulty following? Na   Patient expresses understanding of discharge instructions and education provided has no other needs at this time.  Yes      [place], [facility], Care Management  [phone] [address], [place], [place] [address] Phone: [phone] Email: [email or web address]

## 2022-06-17 NOTE — Telephone Encounter (Signed)
PA has been DENIED due to patient not having try/failed Arnuity.

## 2022-06-20 NOTE — Telephone Encounter (Signed)
Lm for patient.  

## 2022-06-21 NOTE — Telephone Encounter (Signed)
Lm x2 for patient. Will close encounter per office protocol.   

## 2022-06-21 NOTE — Telephone Encounter (Signed)
Patient is aware of below message and voiced her understanding.   Dr. Mortimer Fries, please verify which dose of Arnuity.

## 2022-06-23 DIAGNOSIS — M9902 Segmental and somatic dysfunction of thoracic region: Secondary | ICD-10-CM | POA: Diagnosis not present

## 2022-06-23 DIAGNOSIS — M5451 Vertebrogenic low back pain: Secondary | ICD-10-CM | POA: Diagnosis not present

## 2022-06-23 DIAGNOSIS — M9903 Segmental and somatic dysfunction of lumbar region: Secondary | ICD-10-CM | POA: Diagnosis not present

## 2022-06-23 DIAGNOSIS — M9901 Segmental and somatic dysfunction of cervical region: Secondary | ICD-10-CM | POA: Diagnosis not present

## 2022-06-24 ENCOUNTER — Encounter: Payer: Self-pay | Admitting: Internal Medicine

## 2022-06-24 ENCOUNTER — Ambulatory Visit (INDEPENDENT_AMBULATORY_CARE_PROVIDER_SITE_OTHER): Payer: Medicare Other | Admitting: Internal Medicine

## 2022-06-24 VITALS — BP 128/78 | HR 121 | Ht 66.0 in | Wt 235.0 lb

## 2022-06-24 DIAGNOSIS — D034 Melanoma in situ of scalp and neck: Secondary | ICD-10-CM

## 2022-06-24 DIAGNOSIS — N1832 Chronic kidney disease, stage 3b: Secondary | ICD-10-CM

## 2022-06-24 DIAGNOSIS — I5020 Unspecified systolic (congestive) heart failure: Secondary | ICD-10-CM | POA: Insufficient documentation

## 2022-06-24 DIAGNOSIS — I10 Essential (primary) hypertension: Secondary | ICD-10-CM | POA: Diagnosis not present

## 2022-06-24 DIAGNOSIS — I4729 Other ventricular tachycardia: Secondary | ICD-10-CM

## 2022-06-24 DIAGNOSIS — E782 Mixed hyperlipidemia: Secondary | ICD-10-CM

## 2022-06-24 DIAGNOSIS — I420 Dilated cardiomyopathy: Secondary | ICD-10-CM

## 2022-06-24 DIAGNOSIS — I471 Supraventricular tachycardia, unspecified: Secondary | ICD-10-CM

## 2022-06-24 DIAGNOSIS — Z1231 Encounter for screening mammogram for malignant neoplasm of breast: Secondary | ICD-10-CM

## 2022-06-24 NOTE — Progress Notes (Signed)
Date:  06/24/2022   Name:  Audrey Martinez   DOB:  [birth year]   MRN:  [id number]   Chief Complaint: Annual Exam (Breast exam no pap) Audrey Martinez is a 77 y.o. female who presents today for her Complete Annual Exam. She feels well. She reports exercising walking dog daily. She reports she is sleeping well. Breast complaints none.  She is healing from her facial laceration and bruise.  Mammogram: 07/2021 DEXA: 01/2019 Colonoscopy: 04/2021 repeat 3 yrs  Health Maintenance Due  Topic Date Due   Zoster Vaccines- Shingrix (1 of 2) Never done    Immunization History  Administered Date(s) Administered   Pneumococcal Conjugate-13 07/22/2015   Pneumococcal Polysaccharide-23 07/22/2013   Td 03/03/2002   Tdap 06/08/2022    Hypertension This is a chronic problem. The problem is controlled. Pertinent negatives include no chest pain, headaches, palpitations or shortness of breath. Hypertensive end-organ damage includes kidney disease and CAD/MI. There is no history of CVA or PVD.    Lab Results  Component Value Date   NA 142 03/15/2022   K 4.3 03/15/2022   CO2 27 03/15/2022   GLUCOSE 108 (H) 03/15/2022   BUN 25 (H) 03/15/2022   CREATININE 1.29 (H) 03/15/2022   CALCIUM 9.3 03/15/2022   EGFR 61 06/22/2021   GFRNONAA 44 (L) 03/15/2022   Lab Results  Component Value Date   CHOL 226 (H) 06/22/2021   HDL 66 06/22/2021   LDLCALC 136 (H) 06/22/2021   TRIG 136 06/22/2021   CHOLHDL 3.4 06/22/2021   Lab Results  Component Value Date   TSH 0.552 01/27/2020   No results found for: "HGBA1C" Lab Results  Component Value Date   WBC 9.5 03/28/2022   HGB 16.1 (H) 03/28/2022   HCT 47.9 (H) 03/28/2022   MCV 92 03/28/2022   PLT 260 03/28/2022   Lab Results  Component Value Date   ALT 19 03/15/2022   AST 23 03/15/2022   ALKPHOS 71 03/15/2022   BILITOT 0.9 03/15/2022   Lab Results  Component Value Date   VD25OH 34.76 11/25/2019     Review of Systems  Constitutional:   Negative for chills, fatigue and fever.  HENT:  Negative for congestion, hearing loss, tinnitus, trouble swallowing and voice change.   Eyes:  Negative for visual disturbance.  Respiratory:  Negative for cough, chest tightness, shortness of breath and wheezing.   Cardiovascular:  Negative for chest pain, palpitations and leg swelling.  Gastrointestinal:  Negative for abdominal pain, constipation, diarrhea and vomiting.  Endocrine: Negative for polydipsia and polyuria.  Genitourinary:  Negative for dysuria, frequency, genital sores, vaginal bleeding and vaginal discharge.  Musculoskeletal:  Positive for arthralgias, back pain and gait problem. Negative for joint swelling.  Skin:  Negative for color change and rash.  Neurological:  Negative for dizziness, tremors, light-headedness and headaches.  Hematological:  Negative for adenopathy. Does not bruise/bleed easily.  Psychiatric/Behavioral:  Negative for dysphoric mood and sleep disturbance. The patient is not nervous/anxious.     Patient Active Problem List   Diagnosis Date Noted   HFrEF (heart failure with reduced ejection fraction) ([place]) 06/24/2022   Melanoma in situ of neck ([place]) 06/22/2021   Trochanteric bursitis of right hip 05/18/2021   Hx of colonic polyps    Atrial flutter ([place]) 01/31/2020   Obesity (BMI 30-39.9) 01/31/2020   Left lower lobe pulmonary nodule 01/29/2020   Dilated cardiomyopathy ([place]) 05/02/2019   Amaurosis fugax of right eye 01/13/2019   Chronic kidney  disease, stage 3b ([place]) 11/08/2017   Lumbar radiculopathy 05/18/2017   Lumbar degenerative disc disease 05/18/2017   Spinal stenosis of lumbar region without neurogenic claudication 05/18/2017   Atrial tachycardia 04/14/2017   SVT (supraventricular tachycardia) 04/14/2017   Essential hypertension 04/14/2017   Intermittent asthma without complication 81/44/8185   Osteoporosis 03/21/2017   Benign essential tremor 03/21/2017   Hx of atrioventricular node ablation  02/01/2008   Hyperlipidemia 05/12/2003   Personal history of transient ischemic attack (TIA), and cerebral infarction without residual deficits 04/22/2003    Allergies  Allergen Reactions   Amoxicillin Hives, Shortness Of Breath and Swelling    Did it involve swelling of the face/tongue/throat, SOB, or low BP? Yes Did it involve sudden or severe rash/hives, skin peeling, or any reaction on the inside of your mouth or nose? Yes Did you need to seek medical attention at a hospital or doctor's office? Yes When did it last happen? 1998      If all above answers are "NO", may proceed with cephalosporin use.    Aspirin Anaphylaxis and Shortness Of Breath   Bacitracin-Polymyx-Neo-Hc [Bacitra-Neomycin-Polymyxin-Hc] Rash   Ciprofloxacin Rash   Ciprofloxacin-Dexamethasone Anaphylaxis   Crestor [Rosuvastatin] Other (See Comments)    Caused problems with memory.   Erythromycin Hives and Rash   Hydrochlorothiazide Other (See Comments) and Palpitations    Raises her blood pressure.    Ibuprofen Other (See Comments)    Sharp stomach pains   Morphine And Related Nausea And Vomiting   Morpholine Salicylate Nausea Only   Omeprazole Palpitations and Other (See Comments)    Tachycardia and same sensation as her SVTs per patient   Doxycycline Hyclate Hives   Keflex [Cephalexin] Hives   Lovastatin Other (See Comments)    "Unable to function"   Simvastatin Other (See Comments)    "Unable to function"   Neomycin-Bacitracin Zn-Polymyx Hives and Rash   Tape Hives and Rash    Past Surgical History:  Procedure Laterality Date   BACK SURGERY  2017   L2-4 laminectomy and foraminal stenosis   BREAST CYST ASPIRATION Left [place]  2009   [place] in [place]  2009   CATARACT EXTRACTION, BILATERAL  2010   COLONOSCOPY WITH PROPOFOL N/A 03/21/2018   Procedure: COLONOSCOPY WITH Biopsy;  Surgeon: Lin Landsman, MD;  Location: [facility];  Service: Endoscopy;  Laterality: N/A;   COLONOSCOPY WITH PROPOFOL N/A 05/03/2021   Procedure: COLONOSCOPY WITH PROPOFOL;  Surgeon: Lin Landsman, MD;  Location: [facility] ENDOSCOPY;  Service: Gastroenterology;  Laterality: N/A;   CORONARY ANGIOPLASTY     derrick procedure  1977-78   bilateral    ESOPHAGOGASTRODUODENOSCOPY  2013   gastritis; done for complaint of dysphagia   ESOPHAGOGASTRODUODENOSCOPY (EGD) WITH PROPOFOL N/A 03/21/2018   Procedure: ESOPHAGOGASTRODUODENOSCOPY (EGD) WITH Biopsies;  Surgeon: Lin Landsman, MD;  Location: [facility];  Service: Endoscopy;  Laterality: N/A;  KEEP THIS PATIENT FIRST   JOINT REPLACEMENT     LEFT HEART CATH AND CORONARY ANGIOGRAPHY Left 05/02/2019   Procedure: LEFT HEART CATH AND CORONARY ANGIOGRAPHY;  Surgeon: Minna Merritts, MD;  Location: [facility];  Service: Cardiovascular;  Laterality: Left;   lymph node removal     neck   MOHS SURGERY  07/2016   BCCA nose   POLYPECTOMY N/A 03/21/2018   Procedure: POLYPECTOMY;  Surgeon: Lin Landsman, MD;  Location: [place] [facility];  Service: Endoscopy;  Laterality: N/A;   REPLACEMENT TOTAL KNEE  2013   RT   TOTAL SHOULDER REPLACEMENT  2012   RT     Social History   Tobacco Use   Smoking status: Never   Smokeless tobacco: Never  Vaping Use   Vaping Use: Never used  Substance Use Topics   Alcohol use: Not Currently    Comment: may drink 1-2x/yr   Drug use: No     Medication list has been reviewed and updated.  Current Meds  Medication Sig   B Complex-C (B-COMPLEX WITH VITAMIN C) tablet Take 1 tablet by mouth daily.    baclofen (LIORESAL) 10 MG tablet TAKE 1 TABLET BY MOUTH THREE TIMES A DAY   Bioflavonoid Products (ESTER-C) TABS Take 1 tablet by mouth 2 (two) times daily.    calcium carbonate (OS-CAL - DOSED IN MG OF ELEMENTAL CALCIUM) 1250 (500 Ca) MG tablet Take 1 tablet by mouth.   carvedilol (COREG) 6.25 MG  tablet Take 1 tablet (6.25 mg total) by mouth 2 (two) times daily with a meal.   cholecalciferol (VITAMIN D) 25 MCG (1000 UT) tablet Take 1,000 Units by mouth daily.   Cranberry 1000 MG CAPS Take 1,000 mg by mouth daily.    ENTRESTO 24-26 MG TAKE 1 TABLET BY MOUTH TWICE A DAY   ezetimibe (ZETIA) 10 MG tablet TAKE 1 TABLET BY MOUTH EVERY DAY   famotidine (PEPCID) 20 MG tablet Take 20 mg by mouth as needed.   gabapentin (NEURONTIN) 300 MG capsule Take 1 capsule (300 mg total) by mouth 2 (two) times daily. Home med.   Ginger, Zingiber officinalis, (GINGER PO) Take 1,100 mg by mouth daily.    HYDROcodone-acetaminophen (NORCO/VICODIN) 5-325 MG tablet Take 1 tablet by mouth every 6 (six) hours as needed.   JARDIANCE 10 MG TABS tablet TAKE 1 TABLET BY MOUTH EVERY DAY   meloxicam (MOBIC) 15 MG tablet TAKE 1 TABLET BY MOUTH EVERY DAY AS NEEDED FOR PAIN   Misc Natural Products (TART CHERRY ADVANCED PO) Take 1 capsule by mouth daily.    montelukast (SINGULAIR) 10 MG tablet Take 10 mg by mouth every evening.   Multiple Vitamins-Minerals (MULTIVITAMIN WITH MINERALS) tablet Take 1 tablet by mouth daily. Centrum Silver   Olodaterol HCl (STRIVERDI RESPIMAT) 2.5 MCG/ACT AERS Inhale 2 puffs into the lungs daily. USE AT THE SAME TIME EVERYDAY   Probiotic Product (PROBIOTIC DAILY PO) Take 1 capsule by mouth 2 (two) times a week.    rivaroxaban (XARELTO) 20 MG TABS tablet Take 1 tablet (20 mg total) by mouth as needed (PRN for long car or plane ride).   spironolactone (ALDACTONE) 25 MG tablet Take 0.5 tablets (12.5 mg total) by mouth daily.   Turmeric 500 MG CAPS Take 500 mg by mouth daily.    zinc sulfate 220 (50 Zn) MG capsule Take 1 capsule (220 mg total) by mouth daily.       06/24/2022    8:12 AM 03/28/2022   10:39 AM 06/22/2021    8:25 AM 05/06/2020   10:01 AM  GAD 7 : Generalized Anxiety Score  Nervous, Anxious, on Edge 0 0 0 0  Control/stop worrying 0 0 0 0  Worry too much - different things 0 0 0 0   Trouble relaxing 0 0 0 0  Restless 0 0 0 0  Easily annoyed or irritable 0 0 0 0  Afraid - awful might happen 0 0 0 0  Total GAD 7 Score 0 0 0 0  Anxiety Difficulty Not difficult at all Not difficult at all Not difficult at all        06/24/2022    8:12 AM 04/20/2022   10:06 AM 03/28/2022   10:39 AM  Depression screen PHQ 2/9  Decreased Interest 0 0 0  Down, Depressed, Hopeless 0  0  PHQ - 2 Score 0 0 0  Altered sleeping 0 0 0  Tired, decreased energy 0 0 0  Change in appetite 0 0 0  Feeling bad or failure about yourself  0 0 0  Trouble concentrating 2 0 2  Moving slowly or fidgety/restless 0 0 0  Suicidal thoughts 0 0 0  PHQ-9 Score 2 0 2  Difficult doing work/chores Not difficult at all Not difficult at all Somewhat difficult    BP Readings from Last 3 Encounters:  06/24/22 128/78  06/16/22 128/68  06/08/22 125/86    Physical Exam Vitals and nursing note reviewed.  Constitutional:      General: She is not in acute distress.    Appearance: She is well-developed.  HENT:     Head: Normocephalic and atraumatic.     Right Ear: Tympanic membrane and ear canal normal.     Left Ear: Tympanic membrane and ear canal normal.     Nose:     Right Sinus: No maxillary sinus tenderness.     Left Sinus: No maxillary sinus tenderness.  Eyes:     General: No scleral icterus.       Right eye: No discharge.        Left eye: No discharge.     Conjunctiva/sclera: Conjunctivae normal.  Neck:     Thyroid: No thyromegaly.     Vascular: No carotid bruit.  Cardiovascular:     Rate and Rhythm: Normal rate and regular rhythm.     Pulses: Normal pulses.     Heart sounds: Normal heart sounds.  Pulmonary:     Effort: Pulmonary effort is normal. No respiratory distress.     Breath sounds: No wheezing.  Chest:  Breasts:    Right: No mass, nipple discharge, skin change or tenderness.     Left: No mass, nipple discharge, skin change or tenderness.  Abdominal:     General: Bowel sounds  are normal.     Palpations: Abdomen is soft.     Tenderness: There is no abdominal tenderness.  Musculoskeletal:     Cervical back: Normal range of motion. No erythema.     Right lower leg: No edema.     Left lower leg: No edema.  Lymphadenopathy:     Cervical: No cervical adenopathy.  Skin:    General: Skin is warm and dry.     Findings: No rash.  Neurological:     Mental Status: She is alert and oriented to person, place, and time.     Cranial Nerves: No cranial nerve deficit.     Sensory: No sensory deficit.     Deep Tendon Reflexes: Reflexes are normal and symmetric.  Psychiatric:        Attention and Perception: Attention normal.        Mood and Affect: Mood normal.     Wt Readings from Last 3 Encounters:  06/24/22 235 lb (106.6 kg)  06/16/22 230 lb (104.3 kg)  06/08/22 230 lb (104.3 kg)    BP 128/78   Pulse (!) 121   Ht '5\' 6"'$  (1.676 m)   Wt 235 lb (106.6 kg)   SpO2 94%  BMI 37.93 kg/m   Assessment and Plan: Problem List Items Addressed This Visit       Cardiovascular and Mediastinum   Dilated cardiomyopathy ([place]) (Chronic)    Followed by Cardiology ECHO 12/2019  EF 35%      Essential hypertension - Primary (Chronic)    Clinically stable exam with well controlled BP on coreg and spironolacton. Tolerating medications without side effects. Followed by Cardiology  Pt to continue current regimen and low sodium diet.       Relevant Orders   CBC with Differential/Platelet   Comprehensive metabolic panel   TSH   HFrEF (heart failure with reduced ejection fraction) (HCC) (Chronic)    Followed by Cardiology      SVT (supraventricular tachycardia)    No recent episodes  On Xarelto for anticoagulation        Musculoskeletal and Integument   Melanoma in situ of neck (HCC) (Chronic)    Followed closely by Dermatology        Genitourinary   Chronic kidney disease, stage 3b ([place]) (Chronic)    GFR stable around 42 Would limit nsiads if  possible Maintain hydration Refer to Nephrology if worsening      Relevant Orders   Comprehensive metabolic panel     Other   Hyperlipidemia (Chronic)    Pt declines statins due to hx of adverse SE On Zetia with mild improvement      Relevant Orders   Lipid panel   Other Visit Diagnoses     Encounter for screening mammogram for breast cancer       Relevant Orders   MM 3D SCREEN BREAST BILATERAL   NSVT (nonsustained ventricular tachycardia) (HCC)   (Chronic)         She is up to date on immunizations that she desired.  She declines Flu vaccine, shingrix, Covid and RSV. Colonoscopy is up to date. Tdap was done last week.  Partially dictated using Editor, commissioning. Any errors are unintentional.  Halina Maidens, MD [facility]  06/24/2022

## 2022-06-24 NOTE — Assessment & Plan Note (Signed)
Followed closely by Dermatology

## 2022-06-24 NOTE — Patient Instructions (Signed)
Call [facility] to schedule your mammogram at [phone].  

## 2022-06-24 NOTE — Assessment & Plan Note (Addendum)
Pt declines statins due to hx of adverse SE On Zetia with mild improvement

## 2022-06-24 NOTE — Assessment & Plan Note (Signed)
No recent episodes  On Xarelto for anticoagulation

## 2022-06-24 NOTE — Assessment & Plan Note (Signed)
Clinically stable exam with well controlled BP on coreg and spironolacton. Tolerating medications without side effects. Followed by Cardiology  Pt to continue current regimen and low sodium diet.

## 2022-06-24 NOTE — Assessment & Plan Note (Signed)
GFR stable around 42 Would limit nsiads if possible Maintain hydration Refer to Nephrology if worsening

## 2022-06-24 NOTE — Assessment & Plan Note (Signed)
Followed by Cardiology ECHO 12/2019  EF 35%

## 2022-06-24 NOTE — Assessment & Plan Note (Signed)
Followed by Cardiology 

## 2022-06-25 LAB — COMPREHENSIVE METABOLIC PANEL
ALT: 16 IU/L (ref 0–32)
AST: 23 IU/L (ref 0–40)
Albumin/Globulin Ratio: 2.6 — ABNORMAL HIGH (ref 1.2–2.2)
Albumin: 4.4 g/dL (ref 3.8–4.8)
Alkaline Phosphatase: 64 IU/L (ref 44–121)
BUN/Creatinine Ratio: 14 (ref 12–28)
BUN: 20 mg/dL (ref 8–27)
Bilirubin Total: 0.6 mg/dL (ref 0.0–1.2)
CO2: 21 mmol/L (ref 20–29)
Calcium: 9.7 mg/dL (ref 8.7–10.3)
Chloride: 104 mmol/L (ref 96–106)
Creatinine, Ser: 1.46 mg/dL — ABNORMAL HIGH (ref 0.57–1.00)
Globulin, Total: 1.7 g/dL (ref 1.5–4.5)
Glucose: 92 mg/dL (ref 70–99)
Potassium: 4.7 mmol/L (ref 3.5–5.2)
Sodium: 142 mmol/L (ref 134–144)
Total Protein: 6.1 g/dL (ref 6.0–8.5)
eGFR: 38 mL/min/{1.73_m2} — ABNORMAL LOW (ref >59)

## 2022-06-25 LAB — CBC WITH DIFFERENTIAL/PLATELET
Basophils Absolute: 0 10*3/uL (ref 0.0–0.2)
Basos: 0 %
EOS (ABSOLUTE): 0.2 10*3/uL (ref 0.0–0.4)
Eos: 3 %
Hematocrit: 47.1 % — ABNORMAL HIGH (ref 34.0–46.6)
Hemoglobin: 15.4 g/dL (ref 11.1–15.9)
Immature Grans (Abs): 0 10*3/uL (ref 0.0–0.1)
Immature Granulocytes: 0 %
Lymphocytes Absolute: 2 10*3/uL (ref 0.7–3.1)
Lymphs: 27 %
MCH: 31 pg (ref 26.6–33.0)
MCHC: 32.7 g/dL (ref 31.5–35.7)
MCV: 95 fL (ref 79–97)
Monocytes Absolute: 0.6 10*3/uL (ref 0.1–0.9)
Monocytes: 8 %
Neutrophils Absolute: 4.4 10*3/uL (ref 1.4–7.0)
Neutrophils: 62 %
Platelets: 232 10*3/uL (ref 150–450)
RBC: 4.96 x10E6/uL (ref 3.77–5.28)
RDW: 12.1 % (ref 11.7–15.4)
WBC: 7.2 10*3/uL (ref 3.4–10.8)

## 2022-06-25 LAB — LIPID PANEL
Chol/HDL Ratio: 3.4 ratio (ref 0.0–4.4)
Cholesterol, Total: 229 mg/dL — ABNORMAL HIGH (ref 100–199)
HDL: 67 mg/dL (ref >39)
LDL Chol Calc (NIH): 139 mg/dL — ABNORMAL HIGH (ref 0–99)
Triglycerides: 129 mg/dL (ref 0–149)
VLDL Cholesterol Cal: 23 mg/dL (ref 5–40)

## 2022-06-25 LAB — TSH: TSH: 2.63 u[IU]/mL (ref 0.450–4.500)

## 2022-06-27 DIAGNOSIS — M5451 Vertebrogenic low back pain: Secondary | ICD-10-CM | POA: Diagnosis not present

## 2022-06-27 DIAGNOSIS — M9902 Segmental and somatic dysfunction of thoracic region: Secondary | ICD-10-CM | POA: Diagnosis not present

## 2022-06-27 DIAGNOSIS — M9901 Segmental and somatic dysfunction of cervical region: Secondary | ICD-10-CM | POA: Diagnosis not present

## 2022-06-27 DIAGNOSIS — M9903 Segmental and somatic dysfunction of lumbar region: Secondary | ICD-10-CM | POA: Diagnosis not present

## 2022-06-27 NOTE — Addendum Note (Signed)
Addended by: Claudette Head A on: 06/27/2022 11:07 AM   Modules accepted: Orders

## 2022-06-27 NOTE — Telephone Encounter (Signed)
Dr. Mortimer Fries, please advise. Thanks

## 2022-06-27 NOTE — Telephone Encounter (Signed)
When ordering Arunity it shows as an anaphylaxis allergy to cipro.  Dr. Mortimer Fries, please advise. Thanks

## 2022-06-28 NOTE — Telephone Encounter (Signed)
Dr. Mortimer Fries, please advise on allergy. Thanks

## 2022-06-29 DIAGNOSIS — M9902 Segmental and somatic dysfunction of thoracic region: Secondary | ICD-10-CM | POA: Diagnosis not present

## 2022-06-29 DIAGNOSIS — M5451 Vertebrogenic low back pain: Secondary | ICD-10-CM | POA: Diagnosis not present

## 2022-06-29 DIAGNOSIS — M9901 Segmental and somatic dysfunction of cervical region: Secondary | ICD-10-CM | POA: Diagnosis not present

## 2022-06-29 DIAGNOSIS — M9903 Segmental and somatic dysfunction of lumbar region: Secondary | ICD-10-CM | POA: Diagnosis not present

## 2022-06-30 ENCOUNTER — Telehealth: Payer: Self-pay | Admitting: Internal Medicine

## 2022-06-30 MED ORDER — ARNUITY ELLIPTA 200 MCG/ACT IN AEPB: 1.0000 | INHALATION_SPRAY | Freq: Every day | RESPIRATORY_TRACT | 2 refills | Status: DC

## 2022-06-30 NOTE — Addendum Note (Signed)
Addended by: Claudette Head A on: 06/30/2022 02:33 PM   Modules accepted: Orders

## 2022-06-30 NOTE — Telephone Encounter (Signed)
Pharmacy team, can you guys assist with this?

## 2022-06-30 NOTE — Telephone Encounter (Signed)
Dr. Mortimer Fries, please advise. Thanks

## 2022-06-30 NOTE — Telephone Encounter (Signed)
Arnuity has been sent to preferred pharmacy. Patient is aware and voiced her understanding.  Nothing further needed.

## 2022-06-30 NOTE — Telephone Encounter (Signed)
Lm for patient.  

## 2022-06-30 NOTE — Telephone Encounter (Signed)
Please see 06/10/2022 phone note.

## 2022-07-01 DIAGNOSIS — M5451 Vertebrogenic low back pain: Secondary | ICD-10-CM | POA: Diagnosis not present

## 2022-07-01 DIAGNOSIS — M9901 Segmental and somatic dysfunction of cervical region: Secondary | ICD-10-CM | POA: Diagnosis not present

## 2022-07-01 DIAGNOSIS — M9903 Segmental and somatic dysfunction of lumbar region: Secondary | ICD-10-CM | POA: Diagnosis not present

## 2022-07-01 DIAGNOSIS — M9902 Segmental and somatic dysfunction of thoracic region: Secondary | ICD-10-CM | POA: Diagnosis not present

## 2022-07-04 DIAGNOSIS — M5451 Vertebrogenic low back pain: Secondary | ICD-10-CM | POA: Diagnosis not present

## 2022-07-04 DIAGNOSIS — M9901 Segmental and somatic dysfunction of cervical region: Secondary | ICD-10-CM | POA: Diagnosis not present

## 2022-07-04 DIAGNOSIS — M9902 Segmental and somatic dysfunction of thoracic region: Secondary | ICD-10-CM | POA: Diagnosis not present

## 2022-07-04 DIAGNOSIS — M9903 Segmental and somatic dysfunction of lumbar region: Secondary | ICD-10-CM | POA: Diagnosis not present

## 2022-07-06 DIAGNOSIS — M9903 Segmental and somatic dysfunction of lumbar region: Secondary | ICD-10-CM | POA: Diagnosis not present

## 2022-07-06 DIAGNOSIS — M9901 Segmental and somatic dysfunction of cervical region: Secondary | ICD-10-CM | POA: Diagnosis not present

## 2022-07-06 DIAGNOSIS — M9902 Segmental and somatic dysfunction of thoracic region: Secondary | ICD-10-CM | POA: Diagnosis not present

## 2022-07-06 DIAGNOSIS — M5451 Vertebrogenic low back pain: Secondary | ICD-10-CM | POA: Diagnosis not present

## 2022-07-07 DIAGNOSIS — M9901 Segmental and somatic dysfunction of cervical region: Secondary | ICD-10-CM | POA: Diagnosis not present

## 2022-07-07 DIAGNOSIS — M9902 Segmental and somatic dysfunction of thoracic region: Secondary | ICD-10-CM | POA: Diagnosis not present

## 2022-07-07 DIAGNOSIS — M9903 Segmental and somatic dysfunction of lumbar region: Secondary | ICD-10-CM | POA: Diagnosis not present

## 2022-07-07 DIAGNOSIS — M5451 Vertebrogenic low back pain: Secondary | ICD-10-CM | POA: Diagnosis not present

## 2022-07-08 DIAGNOSIS — M5451 Vertebrogenic low back pain: Secondary | ICD-10-CM | POA: Diagnosis not present

## 2022-07-08 DIAGNOSIS — M9901 Segmental and somatic dysfunction of cervical region: Secondary | ICD-10-CM | POA: Diagnosis not present

## 2022-07-08 DIAGNOSIS — M9903 Segmental and somatic dysfunction of lumbar region: Secondary | ICD-10-CM | POA: Diagnosis not present

## 2022-07-08 DIAGNOSIS — M9902 Segmental and somatic dysfunction of thoracic region: Secondary | ICD-10-CM | POA: Diagnosis not present

## 2022-07-11 DIAGNOSIS — M9902 Segmental and somatic dysfunction of thoracic region: Secondary | ICD-10-CM | POA: Diagnosis not present

## 2022-07-11 DIAGNOSIS — M5451 Vertebrogenic low back pain: Secondary | ICD-10-CM | POA: Diagnosis not present

## 2022-07-11 DIAGNOSIS — M9901 Segmental and somatic dysfunction of cervical region: Secondary | ICD-10-CM | POA: Diagnosis not present

## 2022-07-11 DIAGNOSIS — M9903 Segmental and somatic dysfunction of lumbar region: Secondary | ICD-10-CM | POA: Diagnosis not present

## 2022-07-13 DIAGNOSIS — M9901 Segmental and somatic dysfunction of cervical region: Secondary | ICD-10-CM | POA: Diagnosis not present

## 2022-07-13 DIAGNOSIS — M9902 Segmental and somatic dysfunction of thoracic region: Secondary | ICD-10-CM | POA: Diagnosis not present

## 2022-07-13 DIAGNOSIS — M9903 Segmental and somatic dysfunction of lumbar region: Secondary | ICD-10-CM | POA: Diagnosis not present

## 2022-07-13 DIAGNOSIS — M5451 Vertebrogenic low back pain: Secondary | ICD-10-CM | POA: Diagnosis not present

## 2022-07-15 DIAGNOSIS — M9901 Segmental and somatic dysfunction of cervical region: Secondary | ICD-10-CM | POA: Diagnosis not present

## 2022-07-15 DIAGNOSIS — M5451 Vertebrogenic low back pain: Secondary | ICD-10-CM | POA: Diagnosis not present

## 2022-07-15 DIAGNOSIS — M9903 Segmental and somatic dysfunction of lumbar region: Secondary | ICD-10-CM | POA: Diagnosis not present

## 2022-07-15 DIAGNOSIS — M9902 Segmental and somatic dysfunction of thoracic region: Secondary | ICD-10-CM | POA: Diagnosis not present

## 2022-07-18 DIAGNOSIS — M5451 Vertebrogenic low back pain: Secondary | ICD-10-CM | POA: Diagnosis not present

## 2022-07-18 DIAGNOSIS — M9902 Segmental and somatic dysfunction of thoracic region: Secondary | ICD-10-CM | POA: Diagnosis not present

## 2022-07-18 DIAGNOSIS — M9903 Segmental and somatic dysfunction of lumbar region: Secondary | ICD-10-CM | POA: Diagnosis not present

## 2022-07-18 DIAGNOSIS — M9901 Segmental and somatic dysfunction of cervical region: Secondary | ICD-10-CM | POA: Diagnosis not present

## 2022-07-19 ENCOUNTER — Other Ambulatory Visit: Payer: Self-pay | Admitting: Cardiovascular Disease

## 2022-08-02 ENCOUNTER — Ambulatory Visit: Payer: 59 | Admitting: Cardiovascular Disease

## 2022-08-03 DIAGNOSIS — M5451 Vertebrogenic low back pain: Secondary | ICD-10-CM | POA: Diagnosis not present

## 2022-08-03 DIAGNOSIS — M9902 Segmental and somatic dysfunction of thoracic region: Secondary | ICD-10-CM | POA: Diagnosis not present

## 2022-08-03 DIAGNOSIS — M9903 Segmental and somatic dysfunction of lumbar region: Secondary | ICD-10-CM | POA: Diagnosis not present

## 2022-08-03 DIAGNOSIS — M9901 Segmental and somatic dysfunction of cervical region: Secondary | ICD-10-CM | POA: Diagnosis not present

## 2022-08-09 ENCOUNTER — Other Ambulatory Visit: Payer: Self-pay | Admitting: Internal Medicine

## 2022-08-09 NOTE — Telephone Encounter (Signed)
Requested Prescriptions  Pending Prescriptions Disp Refills   baclofen (LIORESAL) 10 MG tablet [Pharmacy Med Name: BACLOFEN 10 MG TABLET] 270 tablet 0    Sig: TAKE 1 TABLET BY MOUTH THREE TIMES A DAY     Analgesics:  Muscle Relaxants - baclofen Failed - 08/09/2022  3:36 AM      Failed - Cr in normal range and within 180 days    Creatinine, Ser  Date Value Ref Range Status  06/24/2022 1.46 (H) 0.57 - 1.00 mg/dL Final         Passed - eGFR is 30 or above and within 180 days    GFR calc Af Amer  Date Value Ref Range Status  02/01/2020 >60 >60 mL/min Final   GFR, Estimated  Date Value Ref Range Status  03/15/2022 44 (L) >60 mL/min Final    Comment:    (NOTE) Calculated using the CKD-EPI Creatinine Equation (2021)    eGFR  Date Value Ref Range Status  06/24/2022 38 (L) >59 mL/min/1.73 Final         Passed - Valid encounter within last 6 months    Recent Outpatient Visits           1 month ago Essential hypertension   [place] at [place], Jesse Sans, MD   1 month ago Facial laceration, subsequent encounter   [place] at [place], Earley Abide, MD   4 months ago Black stools   [facility] [place] [facility], Jesse Sans, MD   1 year ago Essential hypertension   [place] at [facility], Jesse Sans, MD   2 years ago Essential hypertension   [place] at [facility], Jesse Sans, MD       Future Appointments             In 2 months Gollan, Kathlene November, MD [place] at [place]   In 11 months Army Melia, Jesse Sans, MD [place] at [facility], [facility]

## 2022-08-11 DIAGNOSIS — M9903 Segmental and somatic dysfunction of lumbar region: Secondary | ICD-10-CM | POA: Diagnosis not present

## 2022-08-11 DIAGNOSIS — M9902 Segmental and somatic dysfunction of thoracic region: Secondary | ICD-10-CM | POA: Diagnosis not present

## 2022-08-11 DIAGNOSIS — M5451 Vertebrogenic low back pain: Secondary | ICD-10-CM | POA: Diagnosis not present

## 2022-08-11 DIAGNOSIS — M9901 Segmental and somatic dysfunction of cervical region: Secondary | ICD-10-CM | POA: Diagnosis not present

## 2022-08-18 DIAGNOSIS — M9902 Segmental and somatic dysfunction of thoracic region: Secondary | ICD-10-CM | POA: Diagnosis not present

## 2022-08-18 DIAGNOSIS — M9903 Segmental and somatic dysfunction of lumbar region: Secondary | ICD-10-CM | POA: Diagnosis not present

## 2022-08-18 DIAGNOSIS — M5451 Vertebrogenic low back pain: Secondary | ICD-10-CM | POA: Diagnosis not present

## 2022-08-18 DIAGNOSIS — M9901 Segmental and somatic dysfunction of cervical region: Secondary | ICD-10-CM | POA: Diagnosis not present

## 2022-08-22 ENCOUNTER — Ambulatory Visit: Payer: 59

## 2022-08-25 DIAGNOSIS — M5451 Vertebrogenic low back pain: Secondary | ICD-10-CM | POA: Diagnosis not present

## 2022-08-25 DIAGNOSIS — M9903 Segmental and somatic dysfunction of lumbar region: Secondary | ICD-10-CM | POA: Diagnosis not present

## 2022-08-25 DIAGNOSIS — M9902 Segmental and somatic dysfunction of thoracic region: Secondary | ICD-10-CM | POA: Diagnosis not present

## 2022-08-25 DIAGNOSIS — M9901 Segmental and somatic dysfunction of cervical region: Secondary | ICD-10-CM | POA: Diagnosis not present

## 2022-08-30 ENCOUNTER — Other Ambulatory Visit: Payer: Self-pay | Admitting: Internal Medicine

## 2022-08-30 DIAGNOSIS — E782 Mixed hyperlipidemia: Secondary | ICD-10-CM

## 2022-08-30 NOTE — Telephone Encounter (Signed)
Requested Prescriptions  Pending Prescriptions Disp Refills   meloxicam (MOBIC) 15 MG tablet [Pharmacy Med Name: MELOXICAM 15 MG TABLET] 90 tablet 0    Sig: TAKE 1 TABLET BY MOUTH EVERY DAY AS NEEDED FOR PAIN     Analgesics:  COX2 Inhibitors Failed - 08/30/2022  2:33 AM      Failed - Manual Review: Labs are only required if the patient has taken medication for more than 8 weeks.      Failed - Cr in normal range and within 360 days    Creatinine, Ser  Date Value Ref Range Status  06/24/2022 1.46 (H) 0.57 - 1.00 mg/dL Final         Failed - HCT in normal range and within 360 days    Hematocrit  Date Value Ref Range Status  06/24/2022 47.1 (H) 34.0 - 46.6 % Final         Passed - HGB in normal range and within 360 days    Hemoglobin  Date Value Ref Range Status  06/24/2022 15.4 11.1 - 15.9 g/dL Final         Passed - AST in normal range and within 360 days    AST  Date Value Ref Range Status  06/24/2022 23 0 - 40 IU/L Final         Passed - ALT in normal range and within 360 days    ALT  Date Value Ref Range Status  06/24/2022 16 0 - 32 IU/L Final         Passed - eGFR is 30 or above and within 360 days    GFR calc Af Amer  Date Value Ref Range Status  02/01/2020 >60 >60 mL/min Final   GFR, Estimated  Date Value Ref Range Status  03/15/2022 44 (L) >60 mL/min Final    Comment:    (NOTE) Calculated using the CKD-EPI Creatinine Equation (2021)    eGFR  Date Value Ref Range Status  06/24/2022 38 (L) >59 mL/min/1.73 Final         Passed - Patient is not pregnant      Passed - Valid encounter within last 12 months    Recent Outpatient Visits           2 months ago Essential hypertension   [place] [facility] [place] [facility], Jesse Sans, MD   2 months ago Facial laceration, subsequent encounter   [place] at [place], Earley Abide, MD   5 months ago Black stools   [facility] [place] at [facility], Jesse Sans, MD   1 year ago Essential hypertension   [place] at [facility], Jesse Sans, MD   2 years ago Essential hypertension   [place] at [facility], Jesse Sans, MD       Future Appointments             In 1 month Gollan, Kathlene November, MD [place] at [place]   In 10 months Army Melia Jesse Sans, MD [place] at [facility], [place]             ezetimibe (ZETIA) 10 MG tablet [Pharmacy Med Name: EZETIMIBE 10 MG TABLET] 90 tablet 0    Sig: TAKE 1 TABLET BY MOUTH EVERY DAY     Cardiovascular:  Antilipid -  Sterol Transport Inhibitors Failed - 08/30/2022  2:33 AM      Failed - Lipid Panel in normal range within the last 12 months    Cholesterol, Total  Date Value Ref Range Status  06/24/2022 229 (H) 100 - 199 mg/dL Final   LDL Chol Calc (NIH)  Date Value Ref Range Status  06/24/2022 139 (H) 0 - 99 mg/dL Final   HDL  Date Value Ref Range Status  06/24/2022 67 >39 mg/dL Final   Triglycerides  Date Value Ref Range Status  06/24/2022 129 0 - 149 mg/dL Final         Passed - AST in normal range and within 360 days    AST  Date Value Ref Range Status  06/24/2022 23 0 - 40 IU/L Final         Passed - ALT in normal range and within 360 days    ALT  Date Value Ref Range Status  06/24/2022 16 0 - 32 IU/L Final         Passed - Patient is not pregnant      Passed - Valid encounter within last 12 months    Recent Outpatient Visits           2 months ago Essential hypertension   [place] [facility] at [facility], Jesse Sans, MD   2 months ago Facial laceration, subsequent encounter   [place] at [place], Earley Abide, MD   5 months ago Black stools   [facility] [place] at [facility], Jesse Sans, MD   1 year ago Essential hypertension   [place] at [facility], Jesse Sans, MD   2 years ago Essential hypertension   [place] at [facility], Jesse Sans, MD       Future Appointments             In 1 month Gollan, Kathlene November, MD [place] at [place]   In 10 months Army Melia, Jesse Sans, MD [place] at [facility]

## 2022-09-01 DIAGNOSIS — M5451 Vertebrogenic low back pain: Secondary | ICD-10-CM | POA: Diagnosis not present

## 2022-09-01 DIAGNOSIS — M9901 Segmental and somatic dysfunction of cervical region: Secondary | ICD-10-CM | POA: Diagnosis not present

## 2022-09-01 DIAGNOSIS — M9903 Segmental and somatic dysfunction of lumbar region: Secondary | ICD-10-CM | POA: Diagnosis not present

## 2022-09-01 DIAGNOSIS — M9902 Segmental and somatic dysfunction of thoracic region: Secondary | ICD-10-CM | POA: Diagnosis not present

## 2022-09-06 ENCOUNTER — Ambulatory Visit: Payer: 59

## 2022-09-08 DIAGNOSIS — M9902 Segmental and somatic dysfunction of thoracic region: Secondary | ICD-10-CM | POA: Diagnosis not present

## 2022-09-08 DIAGNOSIS — M5451 Vertebrogenic low back pain: Secondary | ICD-10-CM | POA: Diagnosis not present

## 2022-09-08 DIAGNOSIS — M9901 Segmental and somatic dysfunction of cervical region: Secondary | ICD-10-CM | POA: Diagnosis not present

## 2022-09-08 DIAGNOSIS — M9903 Segmental and somatic dysfunction of lumbar region: Secondary | ICD-10-CM | POA: Diagnosis not present

## 2022-09-15 ENCOUNTER — Other Ambulatory Visit: Payer: Self-pay | Admitting: Physician Assistant

## 2022-09-22 DIAGNOSIS — M5451 Vertebrogenic low back pain: Secondary | ICD-10-CM | POA: Diagnosis not present

## 2022-09-22 DIAGNOSIS — M9902 Segmental and somatic dysfunction of thoracic region: Secondary | ICD-10-CM | POA: Diagnosis not present

## 2022-09-22 DIAGNOSIS — M9903 Segmental and somatic dysfunction of lumbar region: Secondary | ICD-10-CM | POA: Diagnosis not present

## 2022-09-22 DIAGNOSIS — M9901 Segmental and somatic dysfunction of cervical region: Secondary | ICD-10-CM | POA: Diagnosis not present

## 2022-10-10 ENCOUNTER — Other Ambulatory Visit: Payer: Self-pay | Admitting: Internal Medicine

## 2022-10-10 NOTE — Progress Notes (Deleted)
Cardiology Office Note  Date:  10/10/2022   ID:  Chancey, Folker 1945/06/28, MRN [id number]  PCP:  [name], MD   No chief complaint on file.   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  Non-smoker  orthostasis Previous migraines Nonischemic cardiomyopathy ejection fraction 30 to 35% November 2020 Catheterization with nonobstructive coronary disease Ejection fraction remaining 30 to 35% in August 2021 COVID infection  in sept 2021 Bilateral pulmonary emboli, without right heart strain. Who presents for f/u of her atrial tachycardia, SVT, EF 30 to 35%, possible hypertensive cardiomyopathy, medication intolerances  Last seen in clinic by myself September 2023   Feels well, Little orthostasis, reports it is manageable  No regular exercise program, uses a walker secondary to gait instability for longer walking Denies significant leg swelling, no PND orthopnea, no abdominal swelling  Lives alone, son helps her  Blood pressure measurements from home high 90s up to 110 systolic heart rate 70-80  EKG personally reviewed by myself on todays visit Normal sinus rhythm rate 66 bpm nonspecific T wave abnormality  Other past medical history reviewed prior fall, mechanical ,  Hit chin, significant trauma, concussion In the ER did CT scan, right 2mm tentorial SDH after a mechanical fall at home.  Seen by neurology  echo August 2021 ejection fraction 30 to 35%, same as 2020, normal right heart pressures  Echocardiogram April 17, 2019  with ejection fraction 30 to 35%, normal RV function  Cardiac catheterization performed for cardiomyopathy  Catheterization December 2020 Nonobstructive CAD, EF estimated at 30 to 35%, global hypokinesis,  LVEDP 17 to 20 mm  Hg  episode of tachycardia, went to the ER 06/03/2018 Rhythm broke before she could get an EKG in the emergency room Rare episodes, does not remember the last time she had an episode  Right eye vision went dark, 01/02/2019 seen in the ER MRI in the ER, no CVA CT head Encephalomalacia changes in the left frontal region. Left eye: darkness "came down a bit"  zio monitor,  Normal sinus rhythm  avg HR of 89 bpm. 3 Ventricular Tachycardia runs occurred, the run with the fastest interval lasting 5 beats with a max rate of 200 bpm, the longest lasting 7 beats with an avg rate of 141 bpm.  Patient triggered events were not associated with significant arrhythmia.  Carotid: Minor carotid atherosclerosis. No hemodynamically significant ICA stenosis. Degree of narrowing less than 50% bilaterally by ultrasound criteria.   Echocardiogram May 18, 2018 Ejection fraction 45 to 50% mild diffuse hypokinesis unable to exclude regional wall motion abnormalities Concerning for anteroseptal and apical hypokinesis   Lab Results  Component Value Date   CHOL 229 (H) 06/24/2022   HDL 67 06/24/2022   LDLCALC 139 (H) 06/24/2022   TRIG 129 06/24/2022    Notes from [place] cardiologist Notes indicate 1. Dual AV nodal physiology. 2. Typical slow fast AV nodal reentrant tachycardia. 3. Nonsustained AT / No inducible atrial flutter  She underwent s/p Successful radiofrequency ablation of AV nodal slow pathway.   She had recurrent symptoms shortly after, had event monitor  repeat EPS 01/21/08:  1. Markedly prolonged baseline AH interval (AV nodal function is subnormal)  2. No induceable tachycardia; Only induceable double echo beats As per EP,  event monitor with ectopic atrial tachycardia  versus atrial flutter with 2:1 block.   Notes indicating she did not want anticoagulation  PRIOR CARDIAC STUDIES: -ECHOCARDIOGRAM 12/13/06: LVEF 55-60%. No significant valvular abnormalities. Noted to have episodes  of atrial flutter at controlled rate. -ETT 09/28/07: Bruce protocol 5:01 minutes. 7 METS. Maximal HR 179 bpm (111% MPHR). Pretest ECG normal with HR 96 bpm. No chest pain. No significant ischemia.  -STRESS ECHO 11/14/08: Bruce protocol 4:31 minutes. 4.6 METS. 113% MPHR. LVEF 60%. No significant ischemia.  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 -Hypertension -Hyperlipidemia -History of TIA -Asthma -History of migraines -Right cataract and history of right retinal bleeding over a year ago, now resolved -S/P umbilical hernia repair -S/P tonsillectomy -S/P multiple wrist surgeries -S/P excision of right supraclavicular neck mass (lymph node) -S/P cataract surgery   PMH:   has a past medical history of Actinic keratosis, Acute pulmonary embolus (HCC) (01/29/2020), Acute renal failure (ARF) (HCC) (01/31/2020), Acute respiratory failure with hypoxia (HCC) (01/29/2020), Acute septic pulmonary embolism (HCC), AKI (acute kidney injury) (HCC), Amaurosis fugax, right eye (12/2018), Arthritis, Asthma, Basal cell carcinoma (07/2016), Cardiomyopathy (HCC), Coronary artery disease, Dyspnea, Dysrhythmia, Facial laceration (06/16/2022), GERD (gastroesophageal reflux disease), Herniated disc, lumbar, Hyperlipidemia, Left wrist fracture, Melanoma (HCC) (05/12/2020), Osteoporosis, Pneumonia due to COVID-19 virus (01/26/2020), Pulmonary embolus (HCC) (01/31/2020), Stroke (HCC) (1998), SVT (supraventricular tachycardia), and Tendinitis of right knee (07/03/2017).  PSH:    Past Surgical History:  Procedure Laterality Date   BACK SURGERY  2017   L2-4 laminectomy and foraminal stenosis   BREAST CYST ASPIRATION Left 1977   CARDIAC CATHETERIZATION  2009   [facility] in [facility]   CARDIAC ELECTROPHYSIOLOGY STUDY AND ABLATION  2009   CATARACT EXTRACTION, BILATERAL  2010   COLONOSCOPY WITH PROPOFOL N/A 03/21/2018   Procedure: COLONOSCOPY WITH Biopsy;  Surgeon: Toney Reil, MD;   Location: [facility];  Service: Endoscopy;  Laterality: N/A;   COLONOSCOPY WITH PROPOFOL N/A 05/03/2021   Procedure: COLONOSCOPY WITH PROPOFOL;  Surgeon: Toney Reil, MD;  Location: [facility] ENDOSCOPY;  Service: Gastroenterology;  Laterality: N/A;   CORONARY ANGIOPLASTY     derrick procedure  1977-78   bilateral    ESOPHAGOGASTRODUODENOSCOPY  2013   gastritis; done for complaint of dysphagia   ESOPHAGOGASTRODUODENOSCOPY (EGD) WITH PROPOFOL N/A 03/21/2018   Procedure: ESOPHAGOGASTRODUODENOSCOPY (EGD) WITH Biopsies;  Surgeon: Toney Reil, MD;  Location: [facility];  Service: Endoscopy;  Laterality: N/A;  KEEP THIS PATIENT FIRST   JOINT REPLACEMENT     LEFT HEART CATH AND CORONARY ANGIOGRAPHY Left 05/02/2019   Procedure: LEFT HEART CATH AND CORONARY ANGIOGRAPHY;  Surgeon: Antonieta Iba, MD;  Location: [facility] INVASIVE CV LAB;  Service: Cardiovascular;  Laterality: Left;   lymph node removal     neck   MOHS SURGERY  07/2016   BCCA nose   POLYPECTOMY N/A 03/21/2018   Procedure: POLYPECTOMY;  Surgeon: Toney Reil, MD;  Location: [facility];  Service: Endoscopy;  Laterality: N/A;   REPLACEMENT TOTAL KNEE  2013   RT   TOTAL SHOULDER REPLACEMENT  2012   RT     Current Outpatient Medications  Medication Sig Dispense Refill   albuterol (PROVENTIL HFA;VENTOLIN HFA) 108 (90 Base) MCG/ACT inhaler Inhale 1-2 puffs into the lungs every 6 (six) hours as needed for wheezing or shortness of breath. (Patient not taking: Reported on 06/24/2022)     B Complex-C (B-COMPLEX WITH VITAMIN C) tablet Take 1 tablet by mouth daily.  baclofen (LIORESAL) 10 MG tablet TAKE 1 TABLET BY MOUTH THREE TIMES A DAY 270 tablet 0   Bioflavonoid Products (ESTER-C) TABS Take 1 tablet by mouth 2 (two) times daily.      calcium carbonate (OS-CAL - DOSED IN MG OF ELEMENTAL CALCIUM) 1250 (500 Ca) MG tablet Take 1 tablet by mouth.     carvedilol (COREG) 6.25 MG tablet Take 1 tablet  (6.25 mg total) by mouth 2 (two) times daily with a meal. 180 tablet 3   cholecalciferol (VITAMIN D) 25 MCG (1000 UT) tablet Take 1,000 Units by mouth daily.     Cranberry 1000 MG CAPS Take 1,000 mg by mouth daily.      ENTRESTO 24-26 MG TAKE 1 TABLET BY MOUTH TWICE A DAY 180 tablet 0   ezetimibe (ZETIA) 10 MG tablet TAKE 1 TABLET BY MOUTH EVERY DAY 90 tablet 0   famotidine (PEPCID) 20 MG tablet Take 20 mg by mouth as needed.     Fluticasone Furoate (ARNUITY ELLIPTA) 200 MCG/ACT AEPB Inhale 1 puff into the lungs daily. 30 each 2   gabapentin (NEURONTIN) 300 MG capsule Take 1 capsule (300 mg total) by mouth 2 (two) times daily. Home med. 540 capsule 3   Ginger, Zingiber officinalis, (GINGER PO) Take 1,100 mg by mouth daily.      HYDROcodone-acetaminophen (NORCO/VICODIN) 5-325 MG tablet Take 1 tablet by mouth every 6 (six) hours as needed.     ipratropium (ATROVENT) 0.03 % nasal spray SMARTSIG:1-2 Spray(s) Both Nares 3 Times Daily (Patient not taking: Reported on 06/24/2022)     JARDIANCE 10 MG TABS tablet TAKE 1 TABLET BY MOUTH EVERY DAY 90 tablet 0   meloxicam (MOBIC) 15 MG tablet TAKE 1 TABLET BY MOUTH EVERY DAY AS NEEDED FOR PAIN 90 tablet 0   Misc Natural Products (TART CHERRY ADVANCED PO) Take 1 capsule by mouth daily.      montelukast (SINGULAIR) 10 MG tablet Take 10 mg by mouth every evening.     Multiple Vitamins-Minerals (MULTIVITAMIN WITH MINERALS) tablet Take 1 tablet by mouth daily. Centrum Silver     Probiotic Product (PROBIOTIC DAILY PO) Take 1 capsule by mouth 2 (two) times a week.      rivaroxaban (XARELTO) 20 MG TABS tablet Take 1 tablet (20 mg total) by mouth as needed (PRN for long car or plane ride). 30 tablet 3   spironolactone (ALDACTONE) 25 MG tablet Take 0.5 tablets (12.5 mg total) by mouth daily. 90 tablet 3   Turmeric 500 MG CAPS Take 500 mg by mouth daily.      zinc sulfate 220 (50 Zn) MG capsule Take 1 capsule (220 mg total) by mouth daily. 30 capsule 0   No current  facility-administered medications for this visit.     Allergies:   Amoxicillin, Aspirin, Bacitracin-polymyx-neo-hc [bacitra-neomycin-polymyxin-hc], Ciprofloxacin, Ciprofloxacin-dexamethasone, Crestor [rosuvastatin], Erythromycin, Hydrochlorothiazide, Ibuprofen, Morphine and related, Morpholine salicylate, Omeprazole, Doxycycline hyclate, Keflex [cephalexin], Lovastatin, Simvastatin, Neomycin-bacitracin zn-polymyx, and Tape   Social History:  The patient  reports that she has never smoked. She has never used smokeless tobacco. She reports that she does not currently use alcohol. She reports that she does not use drugs.   Family History:   family history includes CAD (age of onset: 74) in her sister; Clotting disorder in her half-sister and half-sister; Diabetes in her mother; Stroke in her mother and sister; Stroke (age of onset: 68) in her sister; Valvular heart disease in her father.    Review of Systems: Review of Systems  Constitutional: Negative.   HENT: Negative.    Respiratory: Negative.    Cardiovascular: Negative.   Gastrointestinal: Negative.   Musculoskeletal: Negative.   Psychiatric/Behavioral: Negative.    All other systems reviewed and are negative.   PHYSICAL EXAM: VS:  There were no vitals taken for this visit. , BMI There is no height or weight on file to calculate BMI. Constitutional:  oriented to person, place, and time. No distress.  HENT:  Head: Grossly normal Eyes:  no discharge. No scleral icterus.  Neck: No JVD, no carotid bruits  Cardiovascular: Regular rate and rhythm, no murmurs appreciated Pulmonary/Chest: Clear to auscultation bilaterally, no wheezes or rails Abdominal: Soft.  no distension.  no tenderness.  Musculoskeletal: Normal range of motion Neurological:  normal muscle tone. Coordination normal. No atrophy Skin: Skin warm and dry Psychiatric: normal affect, pleasant  Recent Labs: 02/13/2022: Magnesium 2.4 06/24/2022: ALT 16; BUN 20; Creatinine,  Ser 1.46; Hemoglobin 15.4; Platelets 232; Potassium 4.7; Sodium 142; TSH 2.630    Lipid Panel Lab Results  Component Value Date   CHOL 229 (H) 06/24/2022   HDL 67 06/24/2022   LDLCALC 139 (H) 06/24/2022   TRIG 129 06/24/2022    Wt Readings from Last 3 Encounters:  06/24/22 235 lb (106.6 kg)  06/16/22 230 lb (104.3 kg)  06/08/22 230 lb (104.3 kg)     ASSESSMENT AND PLAN:  covid 19 pneumonia, Hospitalized Sept 2021 Bilateral PE, treated with Xarelto Taking Xarelto 10 mg PRN for long car trips Sedentary lifestyle  SVT (supraventricular tachycardia) (HCC) -  Prior ablation  Prior tachycardia,  No significant breakthrough in symptoms on carvedilol  Cardiomyopathy, dilated Nonischemic Ejection fraction 35% Continue Coreg 12.5 twice daily, Entresto 24/26 mg twice daily spironolactone 25 We have recommended that she start Jardiance 10 mg daily Little room to advance her medications given mild orthostasis symptoms, low blood pressure  Atrial tachycardia (HCC) -  Prior monitor showing short run VT and PVCs In the emergency room November 2022, Continue carvedilol  Mixed hyperlipidemia - She does not want a statin  on Zetia  Personal history of transient ischemic attack (TIA), and cerebral infarction without residual deficits Does not want aspirin, Plavix  No documentation of atrial fibrillation or flutter She prefers Xarelto as needed  Reactive airway disease/asthma On inhalers, Recovered from COVID Feels symptoms are stable   Total encounter time more than 30 minutes  Greater than 50% was spent in counseling and coordination of care with the patient   No orders of the defined types were placed in this encounter.     Signed, Dossie Arbour, M.D., Ph.D. 10/10/2022  [facility] [place], [place] [phone]

## 2022-10-11 ENCOUNTER — Ambulatory Visit: Payer: Medicare Other | Admitting: Cardiovascular Disease

## 2022-10-11 DIAGNOSIS — I10 Essential (primary) hypertension: Secondary | ICD-10-CM

## 2022-10-11 DIAGNOSIS — I471 Supraventricular tachycardia, unspecified: Secondary | ICD-10-CM

## 2022-10-11 DIAGNOSIS — I420 Dilated cardiomyopathy: Secondary | ICD-10-CM

## 2022-10-11 DIAGNOSIS — I428 Other cardiomyopathies: Secondary | ICD-10-CM

## 2022-10-11 DIAGNOSIS — Z8673 Personal history of transient ischemic attack (TIA), and cerebral infarction without residual deficits: Secondary | ICD-10-CM

## 2022-10-11 DIAGNOSIS — E782 Mixed hyperlipidemia: Secondary | ICD-10-CM

## 2022-10-11 DIAGNOSIS — Z86711 Personal history of pulmonary embolism: Secondary | ICD-10-CM

## 2022-10-11 DIAGNOSIS — R42 Dizziness and giddiness: Secondary | ICD-10-CM

## 2022-10-11 DIAGNOSIS — I4719 Other supraventricular tachycardia: Secondary | ICD-10-CM

## 2022-10-11 DIAGNOSIS — I5020 Unspecified systolic (congestive) heart failure: Secondary | ICD-10-CM

## 2022-10-13 DIAGNOSIS — M5451 Vertebrogenic low back pain: Secondary | ICD-10-CM | POA: Diagnosis not present

## 2022-10-13 DIAGNOSIS — M9901 Segmental and somatic dysfunction of cervical region: Secondary | ICD-10-CM | POA: Diagnosis not present

## 2022-10-13 DIAGNOSIS — M9903 Segmental and somatic dysfunction of lumbar region: Secondary | ICD-10-CM | POA: Diagnosis not present

## 2022-10-13 DIAGNOSIS — M9902 Segmental and somatic dysfunction of thoracic region: Secondary | ICD-10-CM | POA: Diagnosis not present

## 2022-10-20 DIAGNOSIS — M5451 Vertebrogenic low back pain: Secondary | ICD-10-CM | POA: Diagnosis not present

## 2022-10-20 DIAGNOSIS — M9901 Segmental and somatic dysfunction of cervical region: Secondary | ICD-10-CM | POA: Diagnosis not present

## 2022-10-20 DIAGNOSIS — M9903 Segmental and somatic dysfunction of lumbar region: Secondary | ICD-10-CM | POA: Diagnosis not present

## 2022-10-20 DIAGNOSIS — M9902 Segmental and somatic dysfunction of thoracic region: Secondary | ICD-10-CM | POA: Diagnosis not present

## 2022-10-21 ENCOUNTER — Other Ambulatory Visit: Payer: Self-pay | Admitting: Cardiovascular Disease

## 2022-10-21 ENCOUNTER — Other Ambulatory Visit: Payer: Self-pay | Admitting: Internal Medicine

## 2022-10-21 DIAGNOSIS — E782 Mixed hyperlipidemia: Secondary | ICD-10-CM

## 2022-10-21 NOTE — Telephone Encounter (Signed)
Requested Prescriptions  Pending Prescriptions Disp Refills   ezetimibe (ZETIA) 10 MG tablet [Pharmacy Med Name: EZETIMIBE 10 MG TABLET] 90 tablet 2    Sig: TAKE 1 TABLET BY MOUTH EVERY DAY     Cardiovascular:  Antilipid - Sterol Transport Inhibitors Failed - 10/21/2022 10:33 AM      Failed - Lipid Panel in normal range within the last 12 months    Cholesterol, Total  Date Value Ref Range Status  06/24/2022 229 (H) 100 - 199 mg/dL Final   LDL Chol Calc (NIH)  Date Value Ref Range Status  06/24/2022 139 (H) 0 - 99 mg/dL Final   HDL  Date Value Ref Range Status  06/24/2022 67 >39 mg/dL Final   Triglycerides  Date Value Ref Range Status  06/24/2022 129 0 - 149 mg/dL Final         Passed - AST in normal range and within 360 days    AST  Date Value Ref Range Status  06/24/2022 23 0 - 40 IU/L Final         Passed - ALT in normal range and within 360 days    ALT  Date Value Ref Range Status  06/24/2022 16 0 - 32 IU/L Final         Passed - Patient is not pregnant      Passed - Valid encounter within last 12 months    Recent Outpatient Visits           3 months ago Essential hypertension   [place] [facility] at [facility], Nyoka Cowden, MD   4 months ago Facial laceration, subsequent encounter   [facility] & [facility] at [facility], Ocie Bob, MD   6 months ago Black stools   [facility] & [facility] Medicine at [facility], Nyoka Cowden, MD   1 year ago Essential hypertension   [place] [facility] Medicine at [facility], Nyoka Cowden, MD   2 years ago Essential hypertension   [facility] Primary Care & Sports Medicine at [facility], Nyoka Cowden, MD       Future Appointments             In 2 months Gollan, Tollie Pizza, MD [facility] at [place]   In 8 months [name], Nyoka Cowden, MD [facility] Primary Care & Sports Medicine at [facility], [facility]

## 2022-10-27 DIAGNOSIS — M9902 Segmental and somatic dysfunction of thoracic region: Secondary | ICD-10-CM | POA: Diagnosis not present

## 2022-10-27 DIAGNOSIS — M9903 Segmental and somatic dysfunction of lumbar region: Secondary | ICD-10-CM | POA: Diagnosis not present

## 2022-10-27 DIAGNOSIS — M9901 Segmental and somatic dysfunction of cervical region: Secondary | ICD-10-CM | POA: Diagnosis not present

## 2022-10-27 DIAGNOSIS — M5451 Vertebrogenic low back pain: Secondary | ICD-10-CM | POA: Diagnosis not present

## 2022-11-03 DIAGNOSIS — M9903 Segmental and somatic dysfunction of lumbar region: Secondary | ICD-10-CM | POA: Diagnosis not present

## 2022-11-03 DIAGNOSIS — M9902 Segmental and somatic dysfunction of thoracic region: Secondary | ICD-10-CM | POA: Diagnosis not present

## 2022-11-03 DIAGNOSIS — M9901 Segmental and somatic dysfunction of cervical region: Secondary | ICD-10-CM | POA: Diagnosis not present

## 2022-11-03 DIAGNOSIS — M5451 Vertebrogenic low back pain: Secondary | ICD-10-CM | POA: Diagnosis not present

## 2022-11-04 ENCOUNTER — Other Ambulatory Visit: Payer: Self-pay | Admitting: Physician Assistant

## 2022-11-08 ENCOUNTER — Encounter: Payer: Medicare Other | Admitting: Dermatology

## 2022-11-10 DIAGNOSIS — M5451 Vertebrogenic low back pain: Secondary | ICD-10-CM | POA: Diagnosis not present

## 2022-11-10 DIAGNOSIS — M9901 Segmental and somatic dysfunction of cervical region: Secondary | ICD-10-CM | POA: Diagnosis not present

## 2022-11-10 DIAGNOSIS — M9902 Segmental and somatic dysfunction of thoracic region: Secondary | ICD-10-CM | POA: Diagnosis not present

## 2022-11-10 DIAGNOSIS — M9903 Segmental and somatic dysfunction of lumbar region: Secondary | ICD-10-CM | POA: Diagnosis not present

## 2022-11-17 DIAGNOSIS — M9903 Segmental and somatic dysfunction of lumbar region: Secondary | ICD-10-CM | POA: Diagnosis not present

## 2022-11-17 DIAGNOSIS — M5451 Vertebrogenic low back pain: Secondary | ICD-10-CM | POA: Diagnosis not present

## 2022-11-17 DIAGNOSIS — M9902 Segmental and somatic dysfunction of thoracic region: Secondary | ICD-10-CM | POA: Diagnosis not present

## 2022-11-17 DIAGNOSIS — M9901 Segmental and somatic dysfunction of cervical region: Secondary | ICD-10-CM | POA: Diagnosis not present

## 2022-11-24 DIAGNOSIS — M9903 Segmental and somatic dysfunction of lumbar region: Secondary | ICD-10-CM | POA: Diagnosis not present

## 2022-11-24 DIAGNOSIS — M9902 Segmental and somatic dysfunction of thoracic region: Secondary | ICD-10-CM | POA: Diagnosis not present

## 2022-11-24 DIAGNOSIS — M9901 Segmental and somatic dysfunction of cervical region: Secondary | ICD-10-CM | POA: Diagnosis not present

## 2022-11-24 DIAGNOSIS — M5451 Vertebrogenic low back pain: Secondary | ICD-10-CM | POA: Diagnosis not present

## 2022-11-28 DIAGNOSIS — M5451 Vertebrogenic low back pain: Secondary | ICD-10-CM | POA: Diagnosis not present

## 2022-11-28 DIAGNOSIS — M9903 Segmental and somatic dysfunction of lumbar region: Secondary | ICD-10-CM | POA: Diagnosis not present

## 2022-11-28 DIAGNOSIS — M9902 Segmental and somatic dysfunction of thoracic region: Secondary | ICD-10-CM | POA: Diagnosis not present

## 2022-11-28 DIAGNOSIS — M9901 Segmental and somatic dysfunction of cervical region: Secondary | ICD-10-CM | POA: Diagnosis not present

## 2022-11-30 DIAGNOSIS — M9902 Segmental and somatic dysfunction of thoracic region: Secondary | ICD-10-CM | POA: Diagnosis not present

## 2022-11-30 DIAGNOSIS — M5451 Vertebrogenic low back pain: Secondary | ICD-10-CM | POA: Diagnosis not present

## 2022-11-30 DIAGNOSIS — M9903 Segmental and somatic dysfunction of lumbar region: Secondary | ICD-10-CM | POA: Diagnosis not present

## 2022-11-30 DIAGNOSIS — M9901 Segmental and somatic dysfunction of cervical region: Secondary | ICD-10-CM | POA: Diagnosis not present

## 2022-12-05 DIAGNOSIS — M9902 Segmental and somatic dysfunction of thoracic region: Secondary | ICD-10-CM | POA: Diagnosis not present

## 2022-12-05 DIAGNOSIS — M79674 Pain in right toe(s): Secondary | ICD-10-CM | POA: Diagnosis not present

## 2022-12-05 DIAGNOSIS — M5451 Vertebrogenic low back pain: Secondary | ICD-10-CM | POA: Diagnosis not present

## 2022-12-05 DIAGNOSIS — M9903 Segmental and somatic dysfunction of lumbar region: Secondary | ICD-10-CM | POA: Diagnosis not present

## 2022-12-05 DIAGNOSIS — M9901 Segmental and somatic dysfunction of cervical region: Secondary | ICD-10-CM | POA: Diagnosis not present

## 2022-12-06 IMAGING — CT CT HEAD W/O CM
3 series · 15 of 47 positions shown, 18 images · non-contrast
Comparison: CT head 01/01/2019.  MRI brain 01/02/2019

CLINICAL DATA: Tingling to the right side of the face for 2 days.
Previous history of stroke.

EXAM:
CT HEAD WITHOUT CONTRAST
TECHNIQUE: Contiguous axial images were obtained from the base of the skull
through the vertex without intravenous contrast.

[Series 3: head wo · axial · 0.42mm/px · z∈[+289,+414]mm · 9 of 30 slices shown, 12 images]
[im 3/30  brain]
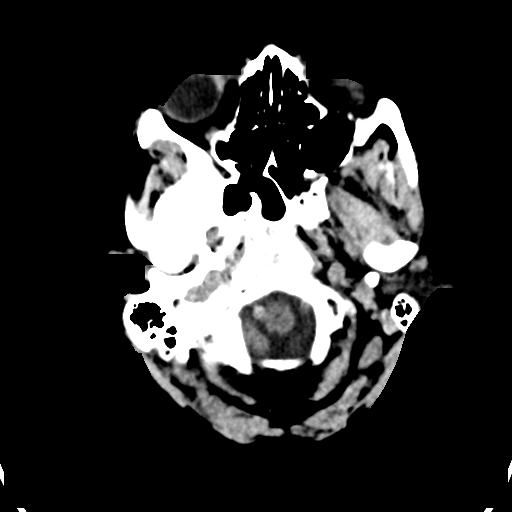
[im 3/30  bone]
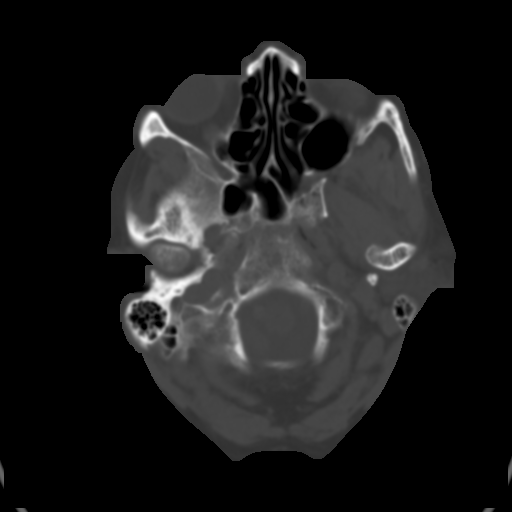
[im 6/30  brain]
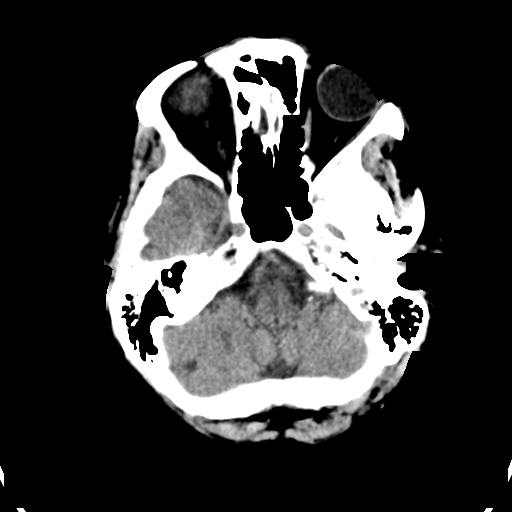
[im 9/30  brain]
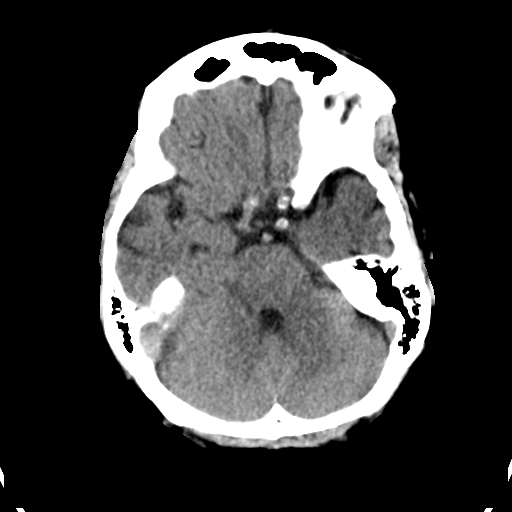
[im 12/30  brain]
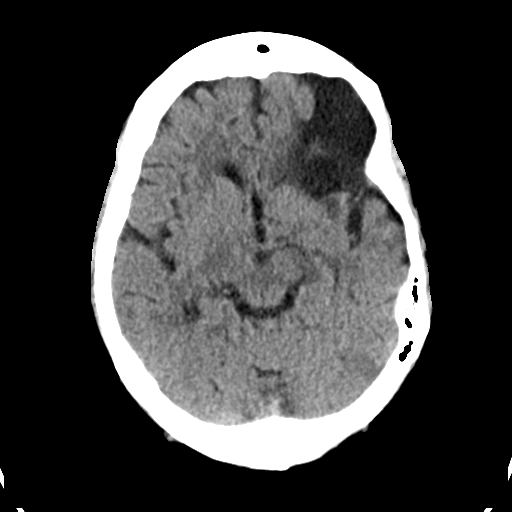
[im 16/30  brain]
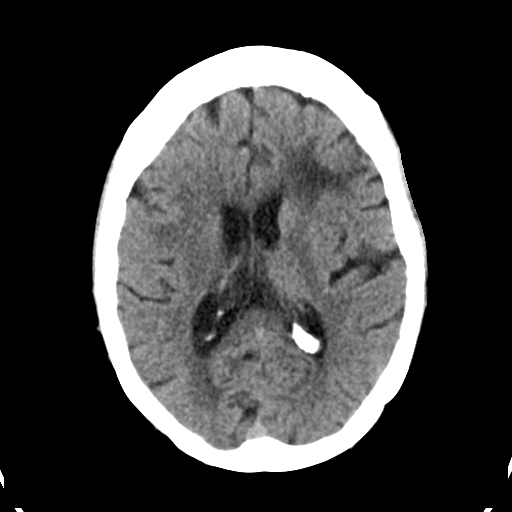
[im 16/30  bone]
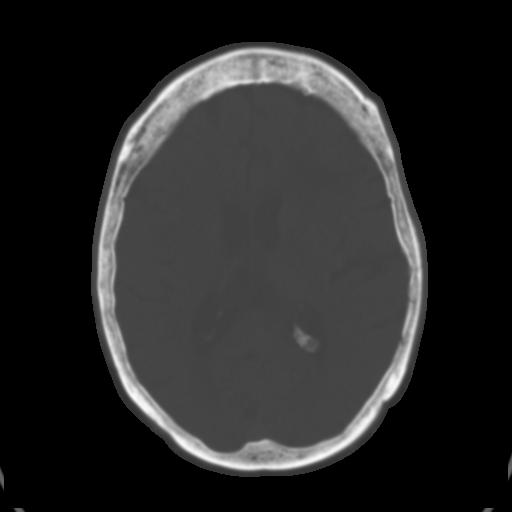
[im 19/30  brain]
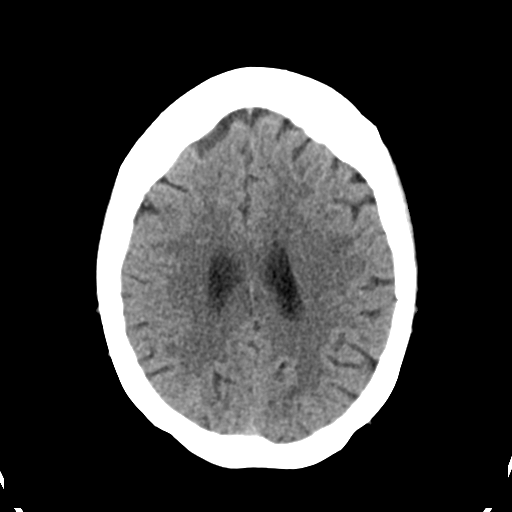
[im 22/30  brain]
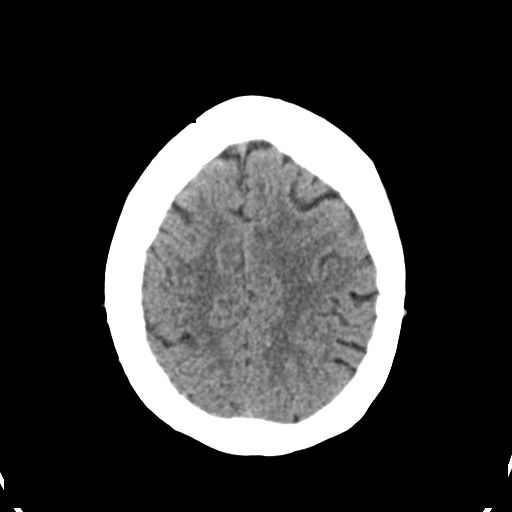
[im 25/30  brain]
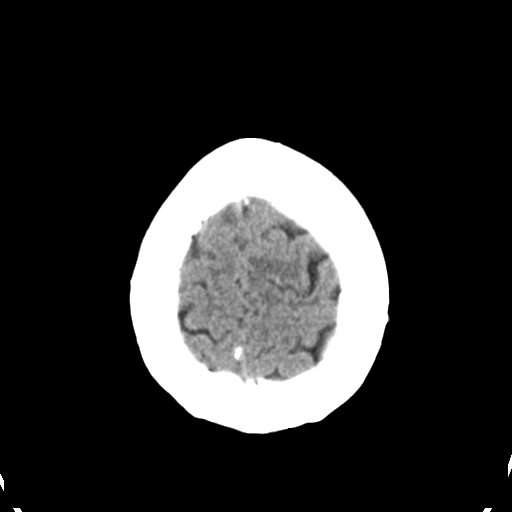
[im 28/30  brain]
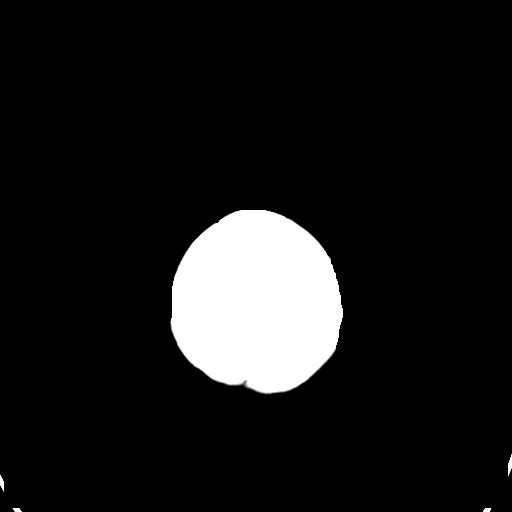
[im 28/30  bone]
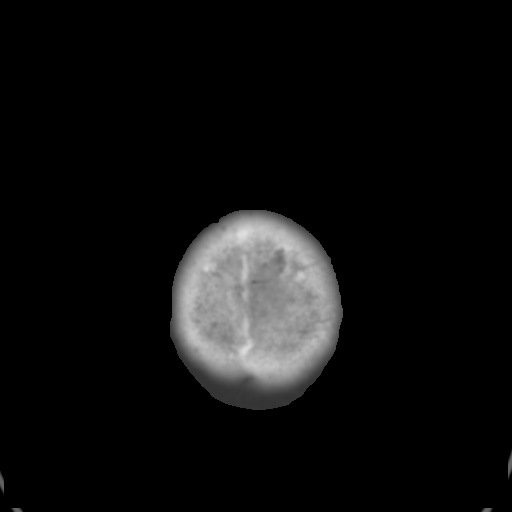

[Series 5: coronal soft tissue · coronal · 0.31mm/px · 3 of 64 slices shown]
[im 22/64  brain]
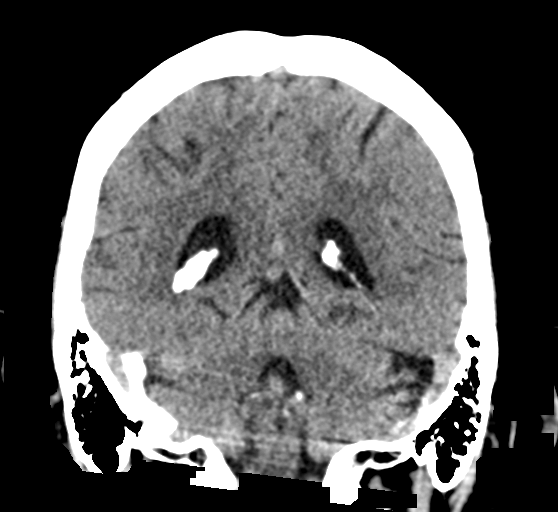
[im 29/64  brain]
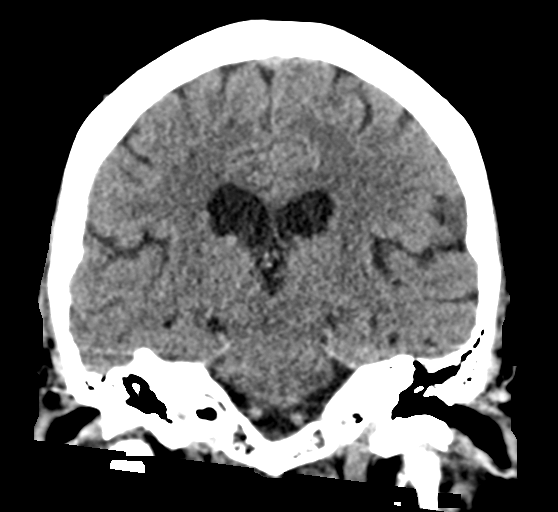
[im 36/64  brain]
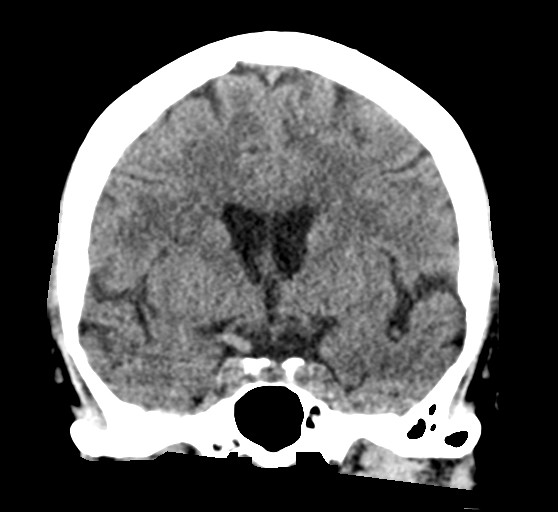

[Series 6: sagittal soft tissue · sagittal · 0.31mm/px · 3 of 53 slices shown]
[im 18/53  brain]
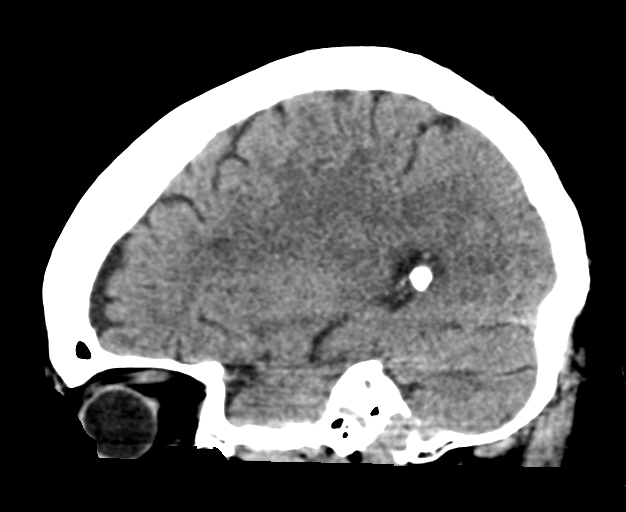
[im 27/53  brain]
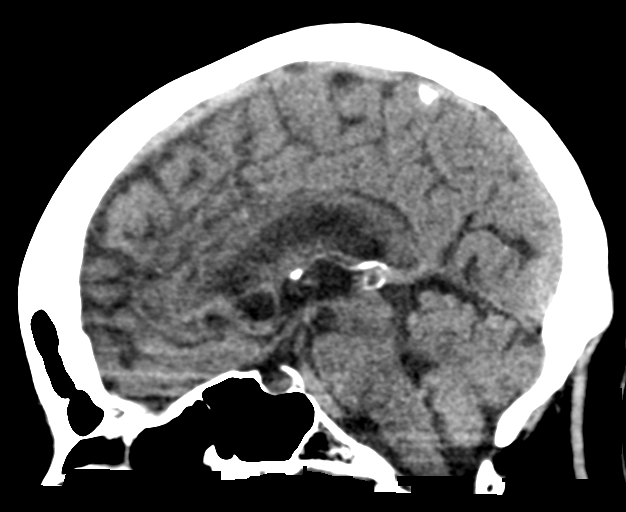
[im 35/53  brain]
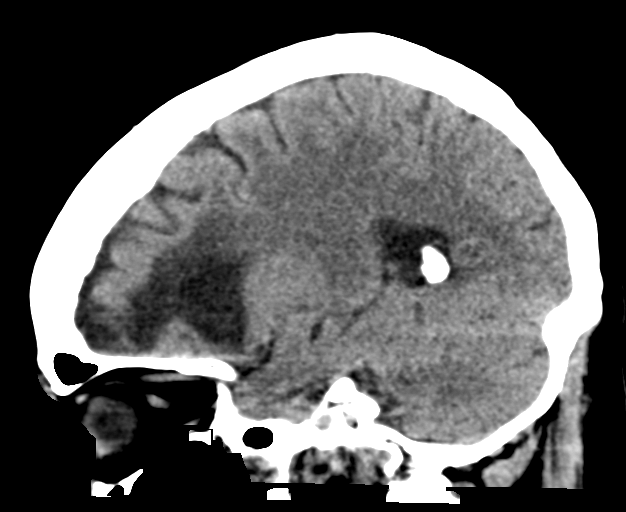

[15 of 47 positions shown; findings below may reference images not displayed]

FINDINGS: Brain: Large area of encephalomalacia in the left frontal lobe
without change since prior study, consistent with an old infarct. No
mass effect or midline shift. No abnormal extra-axial fluid
collections. Patchy low-attenuation changes in the deep white matter
likely represent chronic small vessel ischemic change. Basal
cisterns are not effaced. No acute intracranial hemorrhage.

Vascular: Moderate intracranial arterial vascular calcifications.

Skull: The calvarium appears intact.

Sinuses/Orbits: Paranasal sinuses and mastoid air cells are clear.

Other: None.
IMPRESSION: 1. No acute intracranial abnormalities.
2. Large area of encephalomalacia in the left frontal lobe without
change since prior study, consistent with an old infarct.
3. Chronic small vessel ischemic changes in the deep white matter.

## 2022-12-07 DIAGNOSIS — M9901 Segmental and somatic dysfunction of cervical region: Secondary | ICD-10-CM | POA: Diagnosis not present

## 2022-12-07 DIAGNOSIS — M5451 Vertebrogenic low back pain: Secondary | ICD-10-CM | POA: Diagnosis not present

## 2022-12-07 DIAGNOSIS — M9903 Segmental and somatic dysfunction of lumbar region: Secondary | ICD-10-CM | POA: Diagnosis not present

## 2022-12-07 DIAGNOSIS — M9902 Segmental and somatic dysfunction of thoracic region: Secondary | ICD-10-CM | POA: Diagnosis not present

## 2022-12-12 DIAGNOSIS — M9902 Segmental and somatic dysfunction of thoracic region: Secondary | ICD-10-CM | POA: Diagnosis not present

## 2022-12-12 DIAGNOSIS — M9903 Segmental and somatic dysfunction of lumbar region: Secondary | ICD-10-CM | POA: Diagnosis not present

## 2022-12-12 DIAGNOSIS — M5451 Vertebrogenic low back pain: Secondary | ICD-10-CM | POA: Diagnosis not present

## 2022-12-12 DIAGNOSIS — M9901 Segmental and somatic dysfunction of cervical region: Secondary | ICD-10-CM | POA: Diagnosis not present

## 2022-12-14 DIAGNOSIS — M5451 Vertebrogenic low back pain: Secondary | ICD-10-CM | POA: Diagnosis not present

## 2022-12-14 DIAGNOSIS — M9902 Segmental and somatic dysfunction of thoracic region: Secondary | ICD-10-CM | POA: Diagnosis not present

## 2022-12-14 DIAGNOSIS — M9903 Segmental and somatic dysfunction of lumbar region: Secondary | ICD-10-CM | POA: Diagnosis not present

## 2022-12-14 DIAGNOSIS — M9901 Segmental and somatic dysfunction of cervical region: Secondary | ICD-10-CM | POA: Diagnosis not present

## 2022-12-15 ENCOUNTER — Other Ambulatory Visit: Payer: Self-pay | Admitting: Internal Medicine

## 2022-12-15 NOTE — Telephone Encounter (Signed)
Requested Prescriptions  Pending Prescriptions Disp Refills   baclofen (LIORESAL) 10 MG tablet [Pharmacy Med Name: BACLOFEN 10 MG TABLET] 270 tablet 2    Sig: TAKE 1 TABLET BY MOUTH THREE TIMES A DAY     Analgesics:  Muscle Relaxants - baclofen Failed - 12/15/2022  2:31 AM      Failed - Cr in normal range and within 180 days    Creatinine, Ser  Date Value Ref Range Status  06/24/2022 1.46 (H) 0.57 - 1.00 mg/dL Final         Passed - eGFR is 30 or above and within 180 days    GFR calc Af Amer  Date Value Ref Range Status  02/01/2020 >60 >60 mL/min Final   GFR, Estimated  Date Value Ref Range Status  03/15/2022 44 (L) >60 mL/min Final    Comment:    (NOTE) Calculated using the CKD-EPI Creatinine Equation (2021)    eGFR  Date Value Ref Range Status  06/24/2022 38 (L) >59 mL/min/1.73 Final         Passed - Valid encounter within last 6 months    Recent Outpatient Visits           5 months ago Essential hypertension   [place] [facility] at [facility], Nyoka Cowden, MD   6 months ago Facial laceration, subsequent encounter   [facility] Primary Care & Sports Medicine at [facility], Ocie Bob, MD   8 months ago Black stools   [facility] Primary Care & Sports Medicine at [facility], Nyoka Cowden, MD   1 year ago Essential hypertension   [place] [facility] Medicine at [facility], Nyoka Cowden, MD   2 years ago Essential hypertension   [facility] Care & Sports Medicine at [facility], Nyoka Cowden, MD       Future Appointments             In 3 weeks Gollan, Tollie Pizza, MD [place] [facility] [place]   In 6 months [name], Nyoka Cowden, MD [facility] Primary Care & Sports Medicine at [facility], [facility]

## 2022-12-16 ENCOUNTER — Ambulatory Visit: Payer: Self-pay

## 2022-12-16 ENCOUNTER — Telehealth: Payer: Self-pay | Admitting: Internal Medicine

## 2022-12-16 ENCOUNTER — Ambulatory Visit (INDEPENDENT_AMBULATORY_CARE_PROVIDER_SITE_OTHER): Payer: Medicare Other | Admitting: Family Medicine

## 2022-12-16 ENCOUNTER — Encounter: Payer: Self-pay | Admitting: Family Medicine

## 2022-12-16 VITALS — BP 138/82 | HR 120 | Ht 66.0 in | Wt 222.0 lb

## 2022-12-16 DIAGNOSIS — K5900 Constipation, unspecified: Secondary | ICD-10-CM | POA: Diagnosis not present

## 2022-12-16 NOTE — Telephone Encounter (Signed)
Chief Complaint: Constipation Symptoms: feeling fullness, can not pass stool  Frequency: onset 2-3 days ago  Pertinent Negatives: Patient denies abdominal pain  Disposition: [] ED /[] Urgent Care (no appt availability in office) / [x] Appointment(In office/virtual)/ []  [place] [facility]/ [] Home Care/ [] Refused Recommended Disposition /[] [facility]/ []  Follow-up with PCP Additional Notes: Patient states she has not had a regular bowel movement in -2-3 days. She reports taking magnesium last night and this morning but it was not helpful. She manually pulled a small piece of stool out after straining 2 days ago. She stated that she may have had a small amount of blood in the stool that day but it was difficult to tell. Advised patient that she would need to be evaluated, patient is agreeable. Patient has been scheduled today at 1100.    Summary: Constipation Advise   Pt is calling to report that she has been constipated for a few days. Requesting an appt with Dr. [name]. No available appts. Please advise     Reason for Disposition  Unable to have a bowel movement (BM) without manually removing stool (using finger to pull out stool or perform disimpaction)  Answer Assessment - Initial Assessment Questions 1. STOOL PATTERN OR FREQUENCY: "How often do you have a bowel movement (BM)?"  (Normal range: 3 times a day to every 3 days)  "When was your last BM?"       Everyday usually, last BM was 2-3 days ago 2. STRAINING: "Do you have to strain to have a BM?"      Yes 3. RECTAL PAIN: "Does your rectum hurt when the stool comes out?" If Yes, ask: "Do you have hemorrhoids? How bad is the pain?"  (Scale 1-10; or mild, moderate, severe)     No 4. STOOL COMPOSITION: "Are the stools hard?"      Very hard  5. BLOOD ON STOOLS: "Has there been any blood on the toilet tissue or on the surface of the BM?" If Yes, ask: "When was the last time?"     Maybe a little bite of blood I'm not sure  6.  CHRONIC CONSTIPATION: "Is this a new problem for you?"  If No, ask: "How long have you had this problem?" (days, weeks, months)      I have constipation every now and then but it has been a while. 7. CHANGES IN DIET OR HYDRATION: "Have there been any recent changes in your diet?" "How much fluids are you drinking on a daily basis?"  "How much have you had to drink today?"     No 8. MEDICINES: "Have you been taking any new medicines?" "Are you taking any narcotic pain medicines?" (e.g., Dilaudid, morphine, Percocet, Vicodin)     No, 9. LAXATIVES: "Have you been using any stool softeners, laxatives, or enemas?"  If Yes, ask "What, how often, and when was the last time?"     Magnesium today and last night  10. ACTIVITY:  "How much walking do you do every day?"  "Has your activity level decreased in the past week?"        Im as activity as I usually am 11. CAUSE: "What do you think is causing the constipation?"        No 12. OTHER SYMPTOMS: "Do you have any other symptoms?" (e.g., abdomen pain, bloating, fever, vomiting)       I feel full in my lower abdomen 13. MEDICAL HISTORY: "Do you have a history of hemorrhoids, rectal fissures, or rectal  surgery or rectal abscess?"         Hemorrhoids a long time ago  Protocols used: Constipation-A-AH

## 2022-12-16 NOTE — Telephone Encounter (Signed)
Copied from CRM [id number]. Topic: General - Other >> Dec 16, 2022  2:11 PM Dominique A wrote: Reason for CRM: Pt states that she was just seen today and went home and she has a renewal for her handicap placer and is going to come back and drop it off for her PCP to fill out. Pt would like a copy of the paper to be made for her as well.

## 2022-12-16 NOTE — Patient Instructions (Addendum)
Pick up the following medications from the pharmacy and use as directed: - Saline Enema (Fleet Enema): One enema administered rectally. This can help to soften and expel impacted stool. You can repeat this 1 additional time if needed to expel stool AFTER ENEMA - Senna-docusate (Senna-S / Senokot-S): 1-2 tablets once or twice daily, depending on the severity of constipation and response to treatment  - If symptoms fail to respond or you begin to note lasting abdominal pain, go to urgent care / ER  - Make sure to stay hydrated and review information attached for constipation management

## 2022-12-16 NOTE — Progress Notes (Signed)
     Primary Care / Sports Medicine Office Visit  Patient Information:  Patient ID: Audrey Martinez, female DOB: [birth year] Age: 77 y.o. MRN: [id number]   Audrey Martinez is a pleasant 77 y.o. female presenting with the following:  Chief Complaint  Patient presents with   Constipation    3 days ago was last movement, taken magnesium last night and this morning.     Vitals:   12/16/22 1104  BP: 138/82  Pulse: (!) 120  SpO2: 98%   Vitals:   12/16/22 1104  Weight: 222 lb (100.7 kg)  Height: 5\' 6"  (1.676 m)   Body mass index is 35.83 kg/m.  No results found.   Independent interpretation of notes and tests performed by another provider:   None  Procedures performed:   None  Pertinent History, Exam, Impression, and Recommendations:   Audrey Martinez was seen today for constipation.  Constipation, unspecified constipation type Assessment & Plan: Acute on chronic, has gone 3 days without BM which is typically unusual. Brings up episode 1-2 years prior describing the need for manual disimpaction. Denies abdominal pain, tolerating PO, no NV. She tried some magnesium without benefit.  Exam with tachycardia (baseline on chart review), benign cardiac sounds otherwise, abdomen without abnormalities to inspection, soft, nontender, nondistended, normoactive bowel sounds, no massess including hepatosplenomegaly.  We reviewed treatment options and given her treatments to date, plan to proceed as follows: - Fleets x 1-2 - Senna-docusate 1-2 tabs 1-2 times daily - Increase hydration - Proceed to urgent care / ER for recalcitrant or worsening symptoms - Guidance on preventing future episodes reviewed      Orders & Medications No orders of the defined types were placed in this encounter.  No orders of the defined types were placed in this encounter.    No follow-ups on file.     Jerrol Banana, MD, [facility]   Primary Care Sports Medicine Primary Care and Sports Medicine  at [facility]

## 2022-12-16 NOTE — Assessment & Plan Note (Signed)
Acute on chronic, has gone 3 days without BM which is typically unusual. Brings up episode 1-2 years prior describing the need for manual disimpaction. Denies abdominal pain, tolerating PO, no NV. She tried some magnesium without benefit.  Exam with tachycardia (baseline on chart review), benign cardiac sounds otherwise, abdomen without abnormalities to inspection, soft, nontender, nondistended, normoactive bowel sounds, no massess including hepatosplenomegaly.  We reviewed treatment options and given her treatments to date, plan to proceed as follows: - Fleets x 1-2 - Senna-docusate 1-2 tabs 1-2 times daily - Increase hydration - Proceed to urgent care / ER for recalcitrant or worsening symptoms - Guidance on preventing future episodes reviewed

## 2022-12-19 DIAGNOSIS — M5451 Vertebrogenic low back pain: Secondary | ICD-10-CM | POA: Diagnosis not present

## 2022-12-19 DIAGNOSIS — M9902 Segmental and somatic dysfunction of thoracic region: Secondary | ICD-10-CM | POA: Diagnosis not present

## 2022-12-19 DIAGNOSIS — M9903 Segmental and somatic dysfunction of lumbar region: Secondary | ICD-10-CM | POA: Diagnosis not present

## 2022-12-19 DIAGNOSIS — M9901 Segmental and somatic dysfunction of cervical region: Secondary | ICD-10-CM | POA: Diagnosis not present

## 2022-12-22 DIAGNOSIS — M9902 Segmental and somatic dysfunction of thoracic region: Secondary | ICD-10-CM | POA: Diagnosis not present

## 2022-12-22 DIAGNOSIS — M9903 Segmental and somatic dysfunction of lumbar region: Secondary | ICD-10-CM | POA: Diagnosis not present

## 2022-12-22 DIAGNOSIS — M5451 Vertebrogenic low back pain: Secondary | ICD-10-CM | POA: Diagnosis not present

## 2022-12-22 DIAGNOSIS — M9901 Segmental and somatic dysfunction of cervical region: Secondary | ICD-10-CM | POA: Diagnosis not present

## 2022-12-26 DIAGNOSIS — M9902 Segmental and somatic dysfunction of thoracic region: Secondary | ICD-10-CM | POA: Diagnosis not present

## 2022-12-26 DIAGNOSIS — M5451 Vertebrogenic low back pain: Secondary | ICD-10-CM | POA: Diagnosis not present

## 2022-12-26 DIAGNOSIS — M9901 Segmental and somatic dysfunction of cervical region: Secondary | ICD-10-CM | POA: Diagnosis not present

## 2022-12-26 DIAGNOSIS — M9903 Segmental and somatic dysfunction of lumbar region: Secondary | ICD-10-CM | POA: Diagnosis not present

## 2022-12-27 ENCOUNTER — Ambulatory Visit
Admission: RE | Admit: 2022-12-27 | Discharge: 2022-12-27 | Disposition: A | Discharge status: Home / Self Care | Payer: Medicare Other | Source: Ambulatory Visit | Attending: Internal Medicine | Admitting: Internal Medicine

## 2022-12-27 DIAGNOSIS — Z1231 Encounter for screening mammogram for malignant neoplasm of breast: Secondary | ICD-10-CM | POA: Diagnosis present

## 2022-12-29 DIAGNOSIS — M9902 Segmental and somatic dysfunction of thoracic region: Secondary | ICD-10-CM | POA: Diagnosis not present

## 2022-12-29 DIAGNOSIS — M9903 Segmental and somatic dysfunction of lumbar region: Secondary | ICD-10-CM | POA: Diagnosis not present

## 2022-12-29 DIAGNOSIS — M9901 Segmental and somatic dysfunction of cervical region: Secondary | ICD-10-CM | POA: Diagnosis not present

## 2022-12-29 DIAGNOSIS — M5451 Vertebrogenic low back pain: Secondary | ICD-10-CM | POA: Diagnosis not present

## 2022-12-30 ENCOUNTER — Ambulatory Visit: Payer: Self-pay

## 2022-12-30 NOTE — Telephone Encounter (Signed)
Message from [place]ie S sent at 12/30/2022 10:26 AM EDT  Summary: Blurred vision   The patient states she has had bad blurred vision for a few hours now. She states it is in her left eye. She initially couldn't see out of it at all but now it just goes in and out of blurriness. She says its almost like a film over the left eye. Please assist patient further. She        Called pt and LM on VM to call back to discuss sx.

## 2022-12-30 NOTE — Telephone Encounter (Signed)
Message from [place] S sent at 12/30/2022 10:26 AM EDT  Summary: Blurred vision   The patient states she has had bad blurred vision for a few hours now. She states it is in her left eye. She initially couldn't see out of it at all but now it just goes in and out of blurriness. She says its almost like a film over the left eye. Please assist patient further. She        Called pt and LM on VM to call back.

## 2022-12-30 NOTE — Telephone Encounter (Addendum)
Message from Milroy S sent at 12/30/2022 10:26 AM EDT  Summary: Blurred vision   The patient states she has had bad blurred vision for a few hours now. She states it is in her left eye. She initially couldn't see out of it at all but now it just goes in and out of blurriness. She says its almost like a film over the left eye. Please assist patient further. She        Third attempt to reach pt and no success. LM on VM to call back.  Reason for Disposition . Third attempt to contact caller AND no contact made. Phone number verified.  Protocols used: No Contact or Duplicate Contact Call-A-AH

## 2023-01-02 DIAGNOSIS — M9902 Segmental and somatic dysfunction of thoracic region: Secondary | ICD-10-CM | POA: Diagnosis not present

## 2023-01-02 DIAGNOSIS — M5451 Vertebrogenic low back pain: Secondary | ICD-10-CM | POA: Diagnosis not present

## 2023-01-02 DIAGNOSIS — M9903 Segmental and somatic dysfunction of lumbar region: Secondary | ICD-10-CM | POA: Diagnosis not present

## 2023-01-02 DIAGNOSIS — M9901 Segmental and somatic dysfunction of cervical region: Secondary | ICD-10-CM | POA: Diagnosis not present

## 2023-01-05 DIAGNOSIS — M7061 Trochanteric bursitis, right hip: Secondary | ICD-10-CM | POA: Diagnosis not present

## 2023-01-05 DIAGNOSIS — M205X9 Other deformities of toe(s) (acquired), unspecified foot: Secondary | ICD-10-CM | POA: Diagnosis not present

## 2023-01-09 DIAGNOSIS — M5451 Vertebrogenic low back pain: Secondary | ICD-10-CM | POA: Diagnosis not present

## 2023-01-09 DIAGNOSIS — M9901 Segmental and somatic dysfunction of cervical region: Secondary | ICD-10-CM | POA: Diagnosis not present

## 2023-01-09 DIAGNOSIS — M9903 Segmental and somatic dysfunction of lumbar region: Secondary | ICD-10-CM | POA: Diagnosis not present

## 2023-01-09 DIAGNOSIS — M9902 Segmental and somatic dysfunction of thoracic region: Secondary | ICD-10-CM | POA: Diagnosis not present

## 2023-01-09 NOTE — Progress Notes (Unsigned)
Cardiology Office Note  Date:  01/10/2023   ID:  Audrey, Martinez 1945/08/27, MRN [id number]  PCP:  [name], MD   Chief Complaint  Patient presents with   Follow-up    Pt feels well today. Meds reviewed.    HPI:  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  Non-smoker  orthostasis Previous migraines Nonischemic cardiomyopathy ejection fraction 30 to 35% November 2020 Catheterization with nonobstructive coronary disease Ejection fraction remaining 30 to 35% in August 2021 COVID infection  in sept 2021 Bilateral pulmonary emboli, without right heart strain. 8/21 Who presents for f/u of her atrial tachycardia, SVT, EF 30 to 35%, possible hypertensive cardiomyopathy, medication intolerances  Last seen in clinic by myself September 2023 No Sob, no edema Blood pressure low on her current cardiac medications, denies orthostasis symptoms Having right hip pain, followed by orthopedics Hoping she does not need surgery, walks with a cane, no falls  Denies chest pain, no tachypalpitations concerning for arrhythmia, no chest pain on exertion  Total cholesterol 220 Statins "make me stupid" Tried several  EKG personally reviewed by myself on todays visit EKG Interpretation Date/Time:  Tuesday January 10 2023 15:04:50 EDT Ventricular Rate:  85 PR Interval:  222 QRS Duration:  108 QT Interval:  366 QTC Calculation: 435 R Axis:   -23  Text Interpretation: Sinus rhythm with 1st degree A-V block Nonspecific T wave abnormality When compared with ECG of 15-Mar-2022 18:33, No significant change was found Confirmed by Julien Nordmann [phone]) on 01/10/2023 3:19:41 PM    Other past medical history reviewed prior fall, mechanical ,  Hit chin, significant trauma, concussion In the ER did CT  scan, right 2mm tentorial SDH after a mechanical fall at home.  Seen by neurology  echo August 2021 ejection fraction 30 to 35%, same as 2020, normal right heart pressures  Echocardiogram April 17, 2019  with ejection fraction 30 to 35%, normal RV function  Cardiac catheterization performed for cardiomyopathy  Catheterization December 2020 Nonobstructive CAD, EF estimated at 30 to 35%, global hypokinesis,  LVEDP 17 to 20 mm Hg  episode of tachycardia, went to the ER 06/03/2018 Rhythm broke before she could get an EKG in the emergency room Rare episodes, does not remember the last time she had an episode  Right eye vision went dark, 01/02/2019 seen in the ER MRI in the ER, no CVA CT head Encephalomalacia changes in the left frontal region. Left eye: darkness "came down a bit"  zio monitor,  Normal sinus rhythm  avg HR of 89 bpm. 3 Ventricular Tachycardia runs occurred, the run with the fastest interval lasting 5 beats with a max rate of 200 bpm, the longest lasting 7 beats with an avg rate of 141 bpm.  Patient triggered events were not associated with significant arrhythmia.  Carotid: Minor carotid atherosclerosis. No hemodynamically significant ICA stenosis. Degree of narrowing less than 50% bilaterally by ultrasound criteria.   Echocardiogram May 18, 2018 Ejection fraction 45 to 50% mild diffuse hypokinesis unable to exclude regional wall motion abnormalities Concerning for anteroseptal and apical hypokinesis   Lab Results  Component Value Date   CHOL 229 (H) 06/24/2022   HDL 67 06/24/2022   LDLCALC 139 (H) 06/24/2022   TRIG 129 06/24/2022    Notes from [place] cardiologist Notes indicate 1. Dual  AV nodal physiology. 2. Typical slow fast AV nodal reentrant tachycardia. 3. Nonsustained AT / No inducible atrial flutter  She underwent s/p Successful radiofrequency ablation of AV nodal slow pathway.   She had recurrent symptoms shortly after, had event  monitor  repeat EPS 01/21/08:  1. Markedly prolonged baseline AH interval (AV nodal function is subnormal)  2. No induceable tachycardia; Only induceable double echo beats As per EP,  event monitor with ectopic atrial tachycardia versus atrial flutter with 2:1 block.   Notes indicating she did not want anticoagulation  PRIOR CARDIAC STUDIES: -ECHOCARDIOGRAM 12/13/06: LVEF 55-60%. No significant valvular abnormalities. Noted to have episodes of atrial flutter at controlled rate. -ETT 09/28/07: Bruce protocol 5:01 minutes. 7 METS. Maximal HR 179 bpm (111% MPHR). Pretest ECG normal with HR 96 bpm. No chest pain. No significant ischemia.  -STRESS ECHO 11/14/08: Bruce protocol 4:31 minutes. 4.6 METS. 113% MPHR. LVEF 60%. No significant ischemia.  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 -Hypertension -Hyperlipidemia -History of TIA -Asthma -History of migraines -Right cataract and history of right retinal bleeding over a year ago, now resolved -S/P umbilical hernia repair -S/P tonsillectomy -S/P multiple wrist surgeries -S/P excision of right supraclavicular neck mass (lymph node) -S/P cataract surgery   PMH:   has a past medical history of Actinic keratosis, Acute pulmonary embolus (HCC) (01/29/2020), Acute renal failure (ARF) (HCC) (01/31/2020), Acute respiratory failure with hypoxia (HCC) (01/29/2020), Acute septic pulmonary embolism (HCC), AKI (acute kidney injury) (HCC), Amaurosis fugax, right eye (12/2018), Arthritis, Asthma, Basal cell carcinoma (07/2016), Cardiomyopathy (HCC), Coronary artery disease, Dyspnea, Dysrhythmia, Facial laceration (06/16/2022), GERD (gastroesophageal reflux disease), Herniated disc, lumbar, Hyperlipidemia, Left wrist fracture, Melanoma (HCC) (05/12/2020), Osteoporosis, Pneumonia due to COVID-19 virus (01/26/2020), Pulmonary embolus (HCC) (01/31/2020), Stroke (HCC) (1998), SVT (supraventricular tachycardia), and Tendinitis of right knee (07/03/2017).  PSH:     Past Surgical History:  Procedure Laterality Date   BACK SURGERY  2017   L2-4 laminectomy and foraminal stenosis   BREAST CYST ASPIRATION Left 1977   CARDIAC CATHETERIZATION  2009   [facility]   CARDIAC ELECTROPHYSIOLOGY STUDY AND ABLATION  2009   CATARACT EXTRACTION, BILATERAL  2010   COLONOSCOPY WITH PROPOFOL N/A 03/21/2018   Procedure: COLONOSCOPY WITH Biopsy;  Surgeon: Toney Reil, MD;  Location[facility];  Service: Endoscopy;  Laterality: N/A;   COLONOSCOPY WITH PROPOFOL N/A 05/03/2021   Procedure: COLONOSCOPY WITH PROPOFOL;  Surgeon: Toney Reil, MD;  Location: [facility] END[facility];  Service: Gastroenterology;  Laterality: N/A;   CORONARY ANGIOPLASTY     derrick procedure  1977-78   bilateral    ESOPHAGOGASTRODUODENOSCOPY  2013   gastritis; done for complaint of dysphagia   ESOPHAGOGASTRODUODENOSCOPY (EGD) WITH PROPOFOL N/A 03/21/2018   Procedure: ESOPHAGOGASTRODUODENOSCOPY (EGD) WITH Biopsies;  Surgeon: Toney Reil, MD;  Location: [facility];  Service: Endoscopy;  Laterality: N/A;  KEEP THIS PATIENT FIRST   JOINT REPLACEMENT     LEFT HEART CATH AND CORONARY ANGIOGRAPHY Left 05/02/2019   Procedure: LEFT HEART CATH AND CORONARY ANGIOGRAPHY;  Surgeon: Antonieta Iba, MD;  Location: [facility] INVASIVE CV LAB;  Service: Cardiovascular;  Laterality: Left;   lymph node removal     neck   MOHS SURGERY  07/2016   BCCA nose   POLYPECTOMY N/A 03/21/2018   Procedure: POLYPECTOMY;  Surgeon: Toney Reil, MD;  Location: [facility];  Service: Endoscopy;  Laterality: N/A;   REPLACEMENT TOTAL KNEE  2013   RT  TOTAL SHOULDER REPLACEMENT  2012   RT     Current Outpatient Medications  Medication Sig Dispense Refill   B Complex-C (B-COMPLEX WITH VITAMIN C) tablet Take 1 tablet by mouth daily.      baclofen (LIORESAL) 10 MG tablet TAKE 1 TABLET BY MOUTH THREE TIMES A DAY 270 tablet 2   Bioflavonoid  Products (ESTER-C) TABS Take 1 tablet by mouth 2 (two) times daily.      calcium carbonate (OS-CAL - DOSED IN MG OF ELEMENTAL CALCIUM) 1250 (500 Ca) MG tablet Take 1 tablet by mouth.     carvedilol (COREG) 6.25 MG tablet Take 1 tablet (6.25 mg total) by mouth 2 (two) times daily with a meal. 180 tablet 3   cholecalciferol (VITAMIN D) 25 MCG (1000 UT) tablet Take 1,000 Units by mouth daily.     Cranberry 1000 MG CAPS Take 1,000 mg by mouth daily.      ENTRESTO 24-26 MG TAKE 1 TABLET BY MOUTH TWICE A DAY 180 tablet 0   ezetimibe (ZETIA) 10 MG tablet TAKE 1 TABLET BY MOUTH EVERY DAY 90 tablet 2   famotidine (PEPCID) 20 MG tablet Take 20 mg by mouth as needed.     Fluticasone Furoate (ARNUITY ELLIPTA) 200 MCG/ACT AEPB Inhale 1 puff into the lungs daily. 30 each 2   gabapentin (NEURONTIN) 300 MG capsule Take 1 capsule (300 mg total) by mouth 2 (two) times daily. Home med. 540 capsule 3   Ginger, Zingiber officinalis, (GINGER PO) Take 1,100 mg by mouth daily.      HYDROcodone-acetaminophen (NORCO/VICODIN) 5-325 MG tablet Take 1 tablet by mouth every 6 (six) hours as needed.     JARDIANCE 10 MG TABS tablet TAKE 1 TABLET BY MOUTH EVERY DAY 90 tablet 0   meloxicam (MOBIC) 15 MG tablet TAKE 1 TABLET BY MOUTH EVERY DAY AS NEEDED FOR PAIN 90 tablet 0   Misc Natural Products (TART CHERRY ADVANCED PO) Take 1 capsule by mouth daily.      montelukast (SINGULAIR) 10 MG tablet Take 10 mg by mouth every evening.     Multiple Vitamins-Minerals (MULTIVITAMIN WITH MINERALS) tablet Take 1 tablet by mouth daily. Centrum Silver     Probiotic Product (PROBIOTIC DAILY PO) Take 1 capsule by mouth 2 (two) times a week.      rivaroxaban (XARELTO) 20 MG TABS tablet Take 1 tablet (20 mg total) by mouth as needed (PRN for long car or plane ride). 30 tablet 3   spironolactone (ALDACTONE) 25 MG tablet Take 0.5 tablets (12.5 mg total) by mouth daily. 90 tablet 3   Turmeric 500 MG CAPS Take 500 mg by mouth daily.      zinc sulfate  220 (50 Zn) MG capsule Take 1 capsule (220 mg total) by mouth daily. 30 capsule 0   No current facility-administered medications for this visit.     Allergies:   Amoxicillin, Aspirin, Bacitracin-polymyx-neo-hc [bacitra-neomycin-polymyxin-hc], Ciprofloxacin, Ciprofloxacin-dexamethasone, Crestor [rosuvastatin], Erythromycin, Hydrochlorothiazide, Ibuprofen, Morphine and codeine, Morpholine salicylate, Omeprazole, Doxycycline hyclate, Keflex [cephalexin], Lovastatin, Simvastatin, Neomycin-bacitracin zn-polymyx, and Tape   Social History:  The patient  reports that she has never smoked. She has never used smokeless tobacco. She reports that she does not currently use alcohol. She reports that she does not use drugs.   Family History:   family history includes CAD (age of onset: 73) in her sister; Clotting disorder in her half-sister and half-sister; Diabetes in her mother; Stroke in her mother and sister; Stroke (age of onset: 78) in  her sister; Valvular heart disease in her father.    Review of Systems: Review of Systems  Constitutional: Negative.   HENT: Negative.    Respiratory: Negative.    Cardiovascular: Negative.   Gastrointestinal: Negative.   Musculoskeletal: Negative.   Psychiatric/Behavioral: Negative.    All other systems reviewed and are negative.   PHYSICAL EXAM: VS:  BP 94/63 (BP Location: Left Arm, Patient Position: Sitting, Cuff Size: Large)   Pulse 85   Ht 5\' 8"  (1.727 m)   Wt 218 lb (98.9 kg)   SpO2 96%   BMI 33.15 kg/m  , BMI Body mass index is 33.15 kg/m. Constitutional:  oriented to person, place, and time. No distress.  HENT:  Head: Grossly normal Eyes:  no discharge. No scleral icterus.  Neck: No JVD, no carotid bruits  Cardiovascular: Regular rate and rhythm, no murmurs appreciated Pulmonary/Chest: Clear to auscultation bilaterally, no wheezes or rails Abdominal: Soft.  no distension.  no tenderness.  Musculoskeletal: Normal range of motion Neurological:   normal muscle tone. Coordination normal. No atrophy Skin: Skin warm and dry Psychiatric: normal affect, pleasant  Recent Labs: 02/13/2022: Magnesium 2.4 06/24/2022: ALT 16; BUN 20; Creatinine, Ser 1.46; Hemoglobin 15.4; Platelets 232; Potassium 4.7; Sodium 142; TSH 2.630    Lipid Panel Lab Results  Component Value Date   CHOL 229 (H) 06/24/2022   HDL 67 06/24/2022   LDLCALC 139 (H) 06/24/2022   TRIG 129 06/24/2022    Wt Readings from Last 3 Encounters:  01/10/23 218 lb (98.9 kg)  12/16/22 222 lb (100.7 kg)  06/24/22 235 lb (106.6 kg)     ASSESSMENT AND PLAN:  covid 19 pneumonia, Hospitalized Sept 2021 Bilateral PE, treated with Xarelto Reports that she did not stop her Xarelto, continues to take this daily Recommend she start taking Xarelto 10 mg PRN for long car trips or periods of immobility  SVT (supraventricular tachycardia) (HCC) -  Prior ablation  Prior tachycardia,  Doing well on carvedilol  Cardiomyopathy, dilated Nonischemic Ejection fraction 35% Continue Coreg 12.5 twice daily, Entresto 24/26 mg twice daily spironolactone 25 Jardiance 10 mg daily Blood pressure low, unable to advance her medications  Atrial tachycardia (HCC) -  Prior monitor showing short run VT and PVCs In the emergency room November 2022, Continue carvedilol  Mixed hyperlipidemia - She does not want a statin  on Zetia, could consider PCSK9 inhibitor  Personal history of transient ischemic attack (TIA), and cerebral infarction without residual deficits Does not want aspirin, Plavix  No documentation of atrial fibrillation or flutter She prefers Xarelto as needed  Reactive airway disease/asthma On inhalers, Recovered from COVID Lungs clear on exam   Total encounter time more than 30 minutes  Greater than 50% was spent in counseling and coordination of care with the patient   Orders Placed This Encounter  Procedures   EKG 12-Lead      Signed, Dossie Arbour, M.D.,  Ph.D. 01/10/2023  [facility] [place], [place] [phone]

## 2023-01-10 ENCOUNTER — Encounter: Payer: Self-pay | Admitting: Cardiovascular Disease

## 2023-01-10 ENCOUNTER — Ambulatory Visit: Payer: Medicare Other | Attending: Cardiovascular Disease | Admitting: Cardiovascular Disease

## 2023-01-10 VITALS — BP 94/63 | HR 85 | Ht 68.0 in | Wt 218.0 lb

## 2023-01-10 DIAGNOSIS — I471 Supraventricular tachycardia, unspecified: Secondary | ICD-10-CM | POA: Diagnosis not present

## 2023-01-10 DIAGNOSIS — I5020 Unspecified systolic (congestive) heart failure: Secondary | ICD-10-CM | POA: Diagnosis not present

## 2023-01-10 DIAGNOSIS — I4719 Other supraventricular tachycardia: Secondary | ICD-10-CM | POA: Insufficient documentation

## 2023-01-10 DIAGNOSIS — I10 Essential (primary) hypertension: Secondary | ICD-10-CM | POA: Insufficient documentation

## 2023-01-10 DIAGNOSIS — Z789 Other specified health status: Secondary | ICD-10-CM | POA: Insufficient documentation

## 2023-01-10 DIAGNOSIS — I428 Other cardiomyopathies: Secondary | ICD-10-CM | POA: Diagnosis not present

## 2023-01-10 DIAGNOSIS — Z86711 Personal history of pulmonary embolism: Secondary | ICD-10-CM | POA: Insufficient documentation

## 2023-01-10 DIAGNOSIS — E782 Mixed hyperlipidemia: Secondary | ICD-10-CM | POA: Insufficient documentation

## 2023-01-10 DIAGNOSIS — I420 Dilated cardiomyopathy: Secondary | ICD-10-CM | POA: Diagnosis not present

## 2023-01-10 MED ORDER — CARVEDILOL 6.25 MG PO TABS: 6.2500 mg | ORAL_TABLET | Freq: Two times a day (BID) | ORAL | 3 refills | Status: DC

## 2023-01-10 MED ORDER — SPIRONOLACTONE 25 MG PO TABS: 12.5000 mg | ORAL_TABLET | Freq: Every day | ORAL | 3 refills | Status: DC

## 2023-01-10 NOTE — Patient Instructions (Signed)

## 2023-01-16 DIAGNOSIS — M9902 Segmental and somatic dysfunction of thoracic region: Secondary | ICD-10-CM | POA: Diagnosis not present

## 2023-01-16 DIAGNOSIS — M9901 Segmental and somatic dysfunction of cervical region: Secondary | ICD-10-CM | POA: Diagnosis not present

## 2023-01-16 DIAGNOSIS — M9903 Segmental and somatic dysfunction of lumbar region: Secondary | ICD-10-CM | POA: Diagnosis not present

## 2023-01-16 DIAGNOSIS — M5451 Vertebrogenic low back pain: Secondary | ICD-10-CM | POA: Diagnosis not present

## 2023-01-18 DIAGNOSIS — M79674 Pain in right toe(s): Secondary | ICD-10-CM | POA: Diagnosis not present

## 2023-01-18 DIAGNOSIS — S92534A Nondisplaced fracture of distal phalanx of right lesser toe(s), initial encounter for closed fracture: Secondary | ICD-10-CM | POA: Diagnosis not present

## 2023-01-23 DIAGNOSIS — M9901 Segmental and somatic dysfunction of cervical region: Secondary | ICD-10-CM | POA: Diagnosis not present

## 2023-01-23 DIAGNOSIS — M9903 Segmental and somatic dysfunction of lumbar region: Secondary | ICD-10-CM | POA: Diagnosis not present

## 2023-01-23 DIAGNOSIS — M9902 Segmental and somatic dysfunction of thoracic region: Secondary | ICD-10-CM | POA: Diagnosis not present

## 2023-01-23 DIAGNOSIS — M5451 Vertebrogenic low back pain: Secondary | ICD-10-CM | POA: Diagnosis not present

## 2023-01-26 DIAGNOSIS — M9901 Segmental and somatic dysfunction of cervical region: Secondary | ICD-10-CM | POA: Diagnosis not present

## 2023-01-26 DIAGNOSIS — M5451 Vertebrogenic low back pain: Secondary | ICD-10-CM | POA: Diagnosis not present

## 2023-01-26 DIAGNOSIS — M9903 Segmental and somatic dysfunction of lumbar region: Secondary | ICD-10-CM | POA: Diagnosis not present

## 2023-01-26 DIAGNOSIS — M9902 Segmental and somatic dysfunction of thoracic region: Secondary | ICD-10-CM | POA: Diagnosis not present

## 2023-01-27 ENCOUNTER — Encounter: Payer: Self-pay | Admitting: Internal Medicine

## 2023-01-27 ENCOUNTER — Ambulatory Visit (INDEPENDENT_AMBULATORY_CARE_PROVIDER_SITE_OTHER): Payer: Medicare Other | Admitting: Internal Medicine

## 2023-01-27 VITALS — BP 130/70 | HR 87 | Temp 99.1°F | Ht 68.0 in | Wt 218.8 lb

## 2023-01-27 DIAGNOSIS — J4520 Mild intermittent asthma, uncomplicated: Secondary | ICD-10-CM

## 2023-01-27 NOTE — Patient Instructions (Addendum)
Plan to keep SEREVENT 2 PUFFS IN AM   LETS PLAN TO STOP FLOVENT  Avoid secondhand smoke Avoid Triggers Avoid SICK contacts Recommend  Masking  when appropriate Recommend Keep up-to-date with vaccinations

## 2023-01-27 NOTE — Progress Notes (Signed)
Name: Audrey Martinez MRN: [id number] DOB: [birth year]      CONSULTATION DATE:01/27/2023  REFERRING MD :  Dr Mariah Milling    CHIEF COMPLAINT: Follow-up asthma   HISTORY OF PRESENT ILLNESS:  Patient is here today for follow-up assessment for asthma  previously seen 1 year ago  Doing well with inhalers at this time We will plan to wean off Flovent Asthma is well-controlled Continue Serevent 2 puffs in a.m.   Albuterol use is infrequent On PPI for GERD  No exacerbation at this time No evidence of heart failure at this time No evidence or signs of infection at this time No respiratory distress No fevers, chills, nausea, vomiting, diarrhea No evidence of lower extremity edema No evidence hemoptysis   PREVIOUS COVID 19 infection Sept 2021  Has dogs all her life-no allergies to dogs  Patient does not take any aspirin due to the fact that she states that she has an aspirin sensitivity that triggers her asthma   BP 130/70 (BP Location: Left Arm, Cuff Size: Normal)   Pulse 87   Temp 99.1 F (37.3 C) (Temporal)   Ht 5\' 8"  (1.727 m)   Wt 218 lb 12.8 oz (99.2 kg)   SpO2 94%   BMI 33.27 kg/m     Review of Systems: Gen:  Denies  fever, sweats, chills weight loss  HEENT: Denies blurred vision, double vision, ear pain, eye pain, hearing loss, nose bleeds, sore throat Cardiac:  No dizziness, chest pain or heaviness, chest tightness,edema, No JVD Resp:   No cough, -sputum production, -shortness of breath,-wheezing, -hemoptysis,  Other:  All other systems negative   Physical Examination:   General Appearance: No distress  EYES PERRLA, EOM intact.   NECK Supple, No JVD Pulmonary: normal breath sounds, No wheezing.  CardiovascularNormal S1,S2.  No m/r/g.   Abdomen: Benign, Soft, non-tender. Neurology UE/LE 5/5 strength, no focal deficits Ext pulses intact, cap refill intact ALL OTHER ROS ARE NEGATIVE      Active Ambulatory Problems    Diagnosis Date Noted    Osteoporosis 03/21/2017   Personal history of transient ischemic attack (TIA), and cerebral infarction without residual deficits 04/22/2003   Hyperlipidemia 05/12/2003   Benign essential tremor 03/21/2017   Atrial tachycardia 04/14/2017   SVT (supraventricular tachycardia) 04/14/2017   Essential hypertension 04/14/2017   Intermittent asthma without complication 04/14/2017   Lumbar radiculopathy 05/18/2017   Lumbar degenerative disc disease 05/18/2017   Spinal stenosis of lumbar region without neurogenic claudication 05/18/2017   Chronic kidney disease, stage 3b (HCC) 11/08/2017   Amaurosis fugax of right eye 01/13/2019   Dilated cardiomyopathy (HCC) 05/02/2019   Left lower lobe pulmonary nodule 01/29/2020   Atrial flutter (HCC) 01/31/2020   Obesity (BMI 30-39.9) 01/31/2020   Hx of atrioventricular node ablation 02/01/2008   Hx of colonic polyps    Trochanteric bursitis of right hip 05/18/2021   Melanoma in situ of neck (HCC) 06/22/2021   HFrEF (heart failure with reduced ejection fraction) (HCC) 06/24/2022   Constipation 12/16/2022   Resolved Ambulatory Problems    Diagnosis Date Noted   Tendinitis of right knee 07/03/2017   Arm pain, right 11/08/2017   Chronic obstructive pulmonary disease, unspecified COPD type (HCC) 10/08/2019   Pneumonia due to COVID-19 virus 01/26/2020   Shortness of breath    AKI (acute kidney injury) (HCC)    Acute septic pulmonary embolism (HCC)    Acute respiratory failure with hypoxia (HCC) 01/29/2020   Acute pulmonary embolus (HCC) 01/29/2020  Pulmonary embolus (HCC) 01/31/2020   Acute renal failure (ARF) (HCC) 01/31/2020   Other nonthrombocytopenic purpura (HCC) 06/22/2021   Hypotension 02/12/2022   Facial laceration 06/16/2022   Past Medical History:  Diagnosis Date   Actinic keratosis    Amaurosis fugax, right eye 12/2018   Arthritis    Asthma    Basal cell carcinoma 07/2016   Cardiomyopathy (HCC)    Coronary artery disease    Dyspnea     Dysrhythmia    GERD (gastroesophageal reflux disease)    Herniated disc, lumbar    Left wrist fracture    Melanoma (HCC) 05/12/2020   Stroke (HCC) 1998     ASSESSMENT AND PLAN 77 year old pleasant white female seen today for mild intermittent asthma   Mild intermittent asthma  Seems to be well-controlled at this time  Last office visit we did Flovent 1 puff in the morning 1 puff at night  We will plan to wean off Flovent altogether Continue Serevent 2 puffs in a.m. Patient advised to restart Flovent if she has any respiratory issues  Avoid secondhand smoke Avoid SICK contacts Recommend  Masking  when appropriate Recommend Keep up-to-date with vaccinations   Allergic rhinitis Patient would like to stop Atrovent nasal spray and start Flonase Continue Singulair   GERD seems to be controlled Continue PPI  Obesity -recommend significant weight loss -recommend changing diet  Deconditioned state -Recommend increased daily activity and exercise     MEDICATION ADJUSTMENTS/LABS AND TESTS ORDERED: Patient states she is on Serevent inhaler Continue as prescribed Avoid secondhand smoke Avoid SICK contacts Recommend  Masking  when appropriate Recommend Keep up-to-date with vaccinations    Follow-up with cardiology as scheduled  Patient satisfied with Plan of action and management. All questions answered  Follow-up in 1 year  Total time spent 21 minutes   Chayce Robbins Santiago Glad, M.D.  [facility]  Medical Director [facility] Medical Director [facility] Cardio-Pulmonary Department

## 2023-02-02 DIAGNOSIS — M5451 Vertebrogenic low back pain: Secondary | ICD-10-CM | POA: Diagnosis not present

## 2023-02-02 DIAGNOSIS — M9901 Segmental and somatic dysfunction of cervical region: Secondary | ICD-10-CM | POA: Diagnosis not present

## 2023-02-02 DIAGNOSIS — M9903 Segmental and somatic dysfunction of lumbar region: Secondary | ICD-10-CM | POA: Diagnosis not present

## 2023-02-02 DIAGNOSIS — M9902 Segmental and somatic dysfunction of thoracic region: Secondary | ICD-10-CM | POA: Diagnosis not present

## 2023-02-06 DIAGNOSIS — Z96651 Presence of right artificial knee joint: Secondary | ICD-10-CM | POA: Diagnosis not present

## 2023-02-06 DIAGNOSIS — Z96611 Presence of right artificial shoulder joint: Secondary | ICD-10-CM | POA: Diagnosis not present

## 2023-02-13 DIAGNOSIS — M9903 Segmental and somatic dysfunction of lumbar region: Secondary | ICD-10-CM | POA: Diagnosis not present

## 2023-02-13 DIAGNOSIS — M9902 Segmental and somatic dysfunction of thoracic region: Secondary | ICD-10-CM | POA: Diagnosis not present

## 2023-02-13 DIAGNOSIS — M5451 Vertebrogenic low back pain: Secondary | ICD-10-CM | POA: Diagnosis not present

## 2023-02-13 DIAGNOSIS — M9901 Segmental and somatic dysfunction of cervical region: Secondary | ICD-10-CM | POA: Diagnosis not present

## 2023-02-15 DIAGNOSIS — M25551 Pain in right hip: Secondary | ICD-10-CM | POA: Diagnosis not present

## 2023-02-15 DIAGNOSIS — M25651 Stiffness of right hip, not elsewhere classified: Secondary | ICD-10-CM | POA: Diagnosis not present

## 2023-02-17 DIAGNOSIS — M25551 Pain in right hip: Secondary | ICD-10-CM | POA: Diagnosis not present

## 2023-02-17 DIAGNOSIS — M25651 Stiffness of right hip, not elsewhere classified: Secondary | ICD-10-CM | POA: Diagnosis not present

## 2023-02-20 DIAGNOSIS — M9902 Segmental and somatic dysfunction of thoracic region: Secondary | ICD-10-CM | POA: Diagnosis not present

## 2023-02-20 DIAGNOSIS — M5451 Vertebrogenic low back pain: Secondary | ICD-10-CM | POA: Diagnosis not present

## 2023-02-20 DIAGNOSIS — M9903 Segmental and somatic dysfunction of lumbar region: Secondary | ICD-10-CM | POA: Diagnosis not present

## 2023-02-20 DIAGNOSIS — M9901 Segmental and somatic dysfunction of cervical region: Secondary | ICD-10-CM | POA: Diagnosis not present

## 2023-02-23 DIAGNOSIS — M5451 Vertebrogenic low back pain: Secondary | ICD-10-CM | POA: Diagnosis not present

## 2023-02-23 DIAGNOSIS — M9903 Segmental and somatic dysfunction of lumbar region: Secondary | ICD-10-CM | POA: Diagnosis not present

## 2023-02-23 DIAGNOSIS — M9901 Segmental and somatic dysfunction of cervical region: Secondary | ICD-10-CM | POA: Diagnosis not present

## 2023-02-23 DIAGNOSIS — M9902 Segmental and somatic dysfunction of thoracic region: Secondary | ICD-10-CM | POA: Diagnosis not present

## 2023-02-24 ENCOUNTER — Other Ambulatory Visit: Payer: Self-pay | Admitting: Internal Medicine

## 2023-02-24 ENCOUNTER — Other Ambulatory Visit: Payer: Self-pay | Admitting: Cardiovascular Disease

## 2023-02-27 DIAGNOSIS — M5451 Vertebrogenic low back pain: Secondary | ICD-10-CM | POA: Diagnosis not present

## 2023-02-27 DIAGNOSIS — M9903 Segmental and somatic dysfunction of lumbar region: Secondary | ICD-10-CM | POA: Diagnosis not present

## 2023-02-27 DIAGNOSIS — M9901 Segmental and somatic dysfunction of cervical region: Secondary | ICD-10-CM | POA: Diagnosis not present

## 2023-02-27 DIAGNOSIS — M9902 Segmental and somatic dysfunction of thoracic region: Secondary | ICD-10-CM | POA: Diagnosis not present

## 2023-02-28 DIAGNOSIS — M25551 Pain in right hip: Secondary | ICD-10-CM | POA: Diagnosis not present

## 2023-02-28 DIAGNOSIS — M25651 Stiffness of right hip, not elsewhere classified: Secondary | ICD-10-CM | POA: Diagnosis not present

## 2023-03-02 DIAGNOSIS — M9901 Segmental and somatic dysfunction of cervical region: Secondary | ICD-10-CM | POA: Diagnosis not present

## 2023-03-02 DIAGNOSIS — M9902 Segmental and somatic dysfunction of thoracic region: Secondary | ICD-10-CM | POA: Diagnosis not present

## 2023-03-02 DIAGNOSIS — M9903 Segmental and somatic dysfunction of lumbar region: Secondary | ICD-10-CM | POA: Diagnosis not present

## 2023-03-02 DIAGNOSIS — M5451 Vertebrogenic low back pain: Secondary | ICD-10-CM | POA: Diagnosis not present

## 2023-03-03 DIAGNOSIS — Z0189 Encounter for other specified special examinations: Secondary | ICD-10-CM | POA: Diagnosis not present

## 2023-03-03 DIAGNOSIS — M1611 Unilateral primary osteoarthritis, right hip: Secondary | ICD-10-CM | POA: Diagnosis not present

## 2023-03-03 DIAGNOSIS — M25551 Pain in right hip: Secondary | ICD-10-CM | POA: Diagnosis not present

## 2023-03-03 DIAGNOSIS — M25651 Stiffness of right hip, not elsewhere classified: Secondary | ICD-10-CM | POA: Diagnosis not present

## 2023-03-07 DIAGNOSIS — M25651 Stiffness of right hip, not elsewhere classified: Secondary | ICD-10-CM | POA: Diagnosis not present

## 2023-03-07 DIAGNOSIS — M25551 Pain in right hip: Secondary | ICD-10-CM | POA: Diagnosis not present

## 2023-03-08 ENCOUNTER — Other Ambulatory Visit: Payer: Self-pay | Admitting: Internal Medicine

## 2023-03-08 NOTE — Telephone Encounter (Signed)
Medication Refill - Medication:  montelukast (SINGULAIR) 10 MG tablet   Has the patient contacted their pharmacy? No.  Preferred Pharmacy (with phone number or street name):  [facility] [phone] Dan Humphreys, [place] - [address] Phone: [phone]  Fax: [phone]     Has the patient been seen for an appointment in the last year OR does the patient have an upcoming appointment? Yes.    Agent: Please be advised that RX refills may take up to 3 business days. We ask that you follow-up with your pharmacy.

## 2023-03-08 NOTE — Telephone Encounter (Signed)
Requested medication (s) are due for refill today: Yes  Requested medication (s) are on the active medication list: Yes  Last refill:  11/15/22  Future visit scheduled: Yes  Notes to clinic:  Unable to refill per protocol, last refill by another provider.      Requested Prescriptions  Pending Prescriptions Disp Refills   montelukast (SINGULAIR) 10 MG tablet      Sig: Take 1 tablet (10 mg total) by mouth every evening.     Pulmonology:  Leukotriene Inhibitors Passed - 03/08/2023  2:13 PM      Passed - Valid encounter within last 12 months    Recent Outpatient Visits           2 months ago Constipation, unspecified constipation type   [facility], Ocie Bob, MD   8 months ago Essential hypertension   [facility], Nyoka Cowden, MD   8 months ago Facial laceration, subsequent encounter   [facility], Ocie Bob, MD   11 months ago Black stools   [facility] at [facility], Nyoka Cowden, MD   1 year ago Essential hypertension   [facility] at [facility], Nyoka Cowden, MD       Future Appointments             In 4 months [name], Nyoka Cowden, MD [facility] at [facility], [facility]

## 2023-03-10 DIAGNOSIS — M25651 Stiffness of right hip, not elsewhere classified: Secondary | ICD-10-CM | POA: Diagnosis not present

## 2023-03-10 DIAGNOSIS — M25551 Pain in right hip: Secondary | ICD-10-CM | POA: Diagnosis not present

## 2023-03-14 ENCOUNTER — Other Ambulatory Visit: Payer: Self-pay | Admitting: Internal Medicine

## 2023-03-14 DIAGNOSIS — M1611 Unilateral primary osteoarthritis, right hip: Secondary | ICD-10-CM | POA: Insufficient documentation

## 2023-03-14 DIAGNOSIS — M25551 Pain in right hip: Secondary | ICD-10-CM | POA: Diagnosis not present

## 2023-03-14 DIAGNOSIS — M1631 Unilateral osteoarthritis resulting from hip dysplasia, right hip: Secondary | ICD-10-CM | POA: Insufficient documentation

## 2023-03-14 DIAGNOSIS — M25651 Stiffness of right hip, not elsewhere classified: Secondary | ICD-10-CM | POA: Diagnosis not present

## 2023-03-17 DIAGNOSIS — M25651 Stiffness of right hip, not elsewhere classified: Secondary | ICD-10-CM | POA: Diagnosis not present

## 2023-03-17 DIAGNOSIS — M25551 Pain in right hip: Secondary | ICD-10-CM | POA: Diagnosis not present

## 2023-03-21 DIAGNOSIS — M25551 Pain in right hip: Secondary | ICD-10-CM | POA: Diagnosis not present

## 2023-03-21 DIAGNOSIS — M25651 Stiffness of right hip, not elsewhere classified: Secondary | ICD-10-CM | POA: Diagnosis not present

## 2023-03-24 DIAGNOSIS — M25651 Stiffness of right hip, not elsewhere classified: Secondary | ICD-10-CM | POA: Diagnosis not present

## 2023-03-24 DIAGNOSIS — M25551 Pain in right hip: Secondary | ICD-10-CM | POA: Diagnosis not present

## 2023-03-28 DIAGNOSIS — M25651 Stiffness of right hip, not elsewhere classified: Secondary | ICD-10-CM | POA: Diagnosis not present

## 2023-03-28 DIAGNOSIS — M25551 Pain in right hip: Secondary | ICD-10-CM | POA: Diagnosis not present

## 2023-03-31 DIAGNOSIS — M25551 Pain in right hip: Secondary | ICD-10-CM | POA: Diagnosis not present

## 2023-03-31 DIAGNOSIS — M1611 Unilateral primary osteoarthritis, right hip: Secondary | ICD-10-CM | POA: Diagnosis not present

## 2023-03-31 DIAGNOSIS — M25651 Stiffness of right hip, not elsewhere classified: Secondary | ICD-10-CM | POA: Diagnosis not present

## 2023-03-31 DIAGNOSIS — Z0189 Encounter for other specified special examinations: Secondary | ICD-10-CM | POA: Diagnosis not present

## 2023-04-19 ENCOUNTER — Encounter: Payer: Self-pay | Admitting: Emergency Medicine

## 2023-04-24 ENCOUNTER — Ambulatory Visit: Payer: Medicare Other | Admitting: Emergency Medicine

## 2023-04-24 VITALS — Ht 66.0 in | Wt 200.0 lb

## 2023-04-24 DIAGNOSIS — Z0000 Encounter for general adult medical examination without abnormal findings: Secondary | ICD-10-CM | POA: Diagnosis not present

## 2023-04-24 NOTE — Patient Instructions (Addendum)
Audrey Martinez , Thank you for taking time to come for your Medicare Wellness Visit. I appreciate your ongoing commitment to your health goals. Please review the following plan we discussed and let me know if I can assist you in the future.   Referrals/Orders/Follow-Ups/Clinician Recommendations: Keep up the good work!   This is a list of the screening recommended for you and due dates:  Health Maintenance  Topic Date Due   COVID-19 Vaccine (1) Never done   Zoster (Shingles) Vaccine (1 of 2) Never done   Flu Shot  08/28/2023*   Mammogram  12/27/2023   Medicare Annual Wellness Visit  04/23/2024   Colon Cancer Screening  05/03/2024   DTaP/Tdap/Td vaccine (3 - Td or Tdap) 06/08/2032   Pneumonia Vaccine  Completed   DEXA scan (bone density measurement)  Completed   Hepatitis C Screening  Completed   HPV Vaccine  Aged Out  *Topic was postponed. The date shown is not the original due date.    Advanced directives: Patient declined to bring copy to be scanned to her chart.  Next Medicare Annual Wellness Visit scheduled for next year: Yes, 04/24/24 @ 10:50am  Fall Prevention in the Home, Adult Falls can cause injuries and affect people of all ages. There are many simple things that you can do to make your home safe and to help prevent falls. If you need it, ask for help making these changes. What actions can I take to prevent falls? General information Use good lighting in all rooms. Make sure to: Replace any light bulbs that burn out. Turn on lights if it is dark and use night-lights. Keep items that you use often in easy-to-reach places. Lower the shelves around your home if needed. Move furniture so that there are clear paths around it. Do not keep throw rugs or other things on the floor that can make you trip. If any of your floors are uneven, fix them. Add color or contrast paint or tape to clearly mark and help you see: Grab bars or handrails. First and last steps of  staircases. Where the edge of each step is. If you use a ladder or stepladder: Make sure that it is fully opened. Do not climb a closed ladder. Make sure the sides of the ladder are locked in place. Have someone hold the ladder while you use it. Know where your pets are as you move through your home. What can I do in the bathroom?     Keep the floor dry. Clean up any water that is on the floor right away. Remove soap buildup in the bathtub or shower. Buildup makes bathtubs and showers slippery. Use non-skid mats or decals on the floor of the bathtub or shower. Attach bath mats securely with double-sided, non-slip rug tape. If you need to sit down while you are in the shower, use a non-slip stool. Install grab bars by the toilet and in the bathtub and shower. Do not use towel bars as grab bars. What can I do in the bedroom? Make sure that you have a light by your bed that is easy to reach. Do not use any sheets or blankets on your bed that hang to the floor. Have a firm bench or chair with side arms that you can use for support when you get dressed. What can I do in the kitchen? Clean up any spills right away. If you need to reach something above you, use a sturdy step stool that has a grab  bar. Keep electrical cables out of the way. Do not use floor polish or wax that makes floors slippery. What can I do with my stairs? Do not leave anything on the stairs. Make sure that you have a light switch at the top and the bottom of the stairs. Have them installed if you do not have them. Make sure that there are handrails on both sides of the stairs. Fix handrails that are broken or loose. Make sure that handrails are as long as the staircases. Install non-slip stair treads on all stairs in your home if they do not have carpet. Avoid having throw rugs at the top or bottom of stairs, or secure the rugs with carpet tape to prevent them from moving. Choose a carpet design that does not hide the  edge of steps on the stairs. Make sure that carpet is firmly attached to the stairs. Fix any carpet that is loose or worn. What can I do on the outside of my home? Use bright outdoor lighting. Repair the edges of walkways and driveways and fix any cracks. Clear paths of anything that can make you trip, such as tools or rocks. Add color or contrast paint or tape to clearly mark and help you see high doorway thresholds. Trim any bushes or trees on the main path into your home. Check that handrails are securely fastened and in good repair. Both sides of all steps should have handrails. Install guardrails along the edges of any raised decks or porches. Have leaves, snow, and ice cleared regularly. Use sand, salt, or ice melt on walkways during winter months if you live where there is ice and snow. In the garage, clean up any spills right away, including grease or oil spills. What other actions can I take? Review your medicines with your health care provider. Some medicines can make you confused or feel dizzy. This can increase your chance of falling. Wear closed-toe shoes that fit well and support your feet. Wear shoes that have rubber soles and low heels. Use a cane, walker, scooter, or crutches that help you move around if needed. Talk with your provider about other ways that you can decrease your risk of falls. This may include seeing a physical therapist to learn to do exercises to improve movement and strength. Where to find more information Centers for Disease Control and Prevention, [employer]: TonerPromos.no [employer] on Aging: BaseRingTones.pl National Institute on Aging: BaseRingTones.pl Contact a health care provider if: You are afraid of falling at home. You feel weak, drowsy, or dizzy at home. You fall at home. Get help right away if you: Lose consciousness or have trouble moving after a fall. Have a fall that causes a head injury. These symptoms may be an emergency. Get help right away. Call  911. Do not wait to see if the symptoms will go away. Do not drive yourself to the hospital. This information is not intended to replace advice given to you by your health care provider. Make sure you discuss any questions you have with your health care provider. Document Revised: 01/17/2022 Document Reviewed: 01/17/2022 Elsevier Patient Education  2024 Elsevier Inc.   Managing Pain Without Opioids Opioids are strong medicines used to treat moderate to severe pain. For some people, especially those who have long-term (chronic) pain, opioids may not be the best choice for pain management due to: Side effects like nausea, constipation, and sleepiness. The risk of addiction (opioid use disorder). The longer you take opioids, the greater your risk  of addiction. Pain that lasts for more than 3 months is called chronic pain. Managing chronic pain usually requires more than one approach and is often provided by a team of health care providers working together (multidisciplinary approach). Pain management may be done at a pain management center or pain clinic. How to manage pain without the use of opioids Use non-opioid medicines Non-opioid medicines for pain may include: Over-the-counter or prescription non-steroidal anti-inflammatory drugs (NSAIDs). These may be the first medicines used for pain. They work well for muscle and bone pain, and they reduce swelling. Acetaminophen. This over-the-counter medicine may work well for milder pain but not swelling. Antidepressants. These may be used to treat chronic pain. A certain type of antidepressant (tricyclics) is often used. These medicines are given in lower doses for pain than when used for depression. Anticonvulsants. These are usually used to treat seizures but may also reduce nerve (neuropathic) pain. Muscle relaxants. These relieve pain caused by sudden muscle tightening (spasms). You may also use a pain medicine that is applied to the skin as a  patch, cream, or gel (topical analgesic), such as a numbing medicine. These may cause fewer side effects than medicines taken by mouth. Do certain therapies as directed Some therapies can help with pain management. They include: Physical therapy. You will do exercises to gain strength and flexibility. A physical therapist may teach you exercises to move and stretch parts of your body that are weak, stiff, or painful. You can learn these exercises at physical therapy visits and practice them at home. Physical therapy may also involve: Massage. Heat wraps or applying heat or cold to affected areas. Electrical signals that interrupt pain signals (transcutaneous electrical nerve stimulation, TENS). Weak lasers that reduce pain and swelling (low-level laser therapy). Signals from your body that help you learn to regulate pain (biofeedback). Occupational therapy. This helps you to learn ways to function at home and work with less pain. Recreational therapy. This involves trying new activities or hobbies, such as a physical activity or drawing. Mental health therapy, including: Cognitive behavioral therapy (CBT). This helps you learn coping skills for dealing with pain. Acceptance and commitment therapy (ACT) to change the way you think and react to pain. Relaxation therapies, including muscle relaxation exercises and mindfulness-based stress reduction. Pain management counseling. This may be individual, family, or group counseling.  Receive medical treatments Medical treatments for pain management include: Nerve block injections. These may include a pain blocker and anti-inflammatory medicines. You may have injections: Near the spine to relieve chronic back or neck pain. Into joints to relieve back or joint pain. Into nerve areas that supply a painful area to relieve body pain. Into muscles (trigger point injections) to relieve some painful muscle conditions. A medical device placed near your spine  to help block pain signals and relieve nerve pain or chronic back pain (spinal cord stimulation device). Acupuncture. Follow these instructions at home Medicines Take over-the-counter and prescription medicines only as told by your health care provider. If you are taking pain medicine, ask your health care providers about possible side effects to watch out for. Do not drive or use heavy machinery while taking prescription opioid pain medicine. Lifestyle  Do not use drugs or alcohol to reduce pain. If you drink alcohol, limit how much you have to: 0-1 drink a day for women who are not pregnant. 0-2 drinks a day for men. Know how much alcohol is in a drink. In the [place] one drink equals one 12 oz  bottle of beer (355 mL), one 5 oz glass of wine (148 mL), or one 1 oz glass of hard liquor (44 mL). Do not use any products that contain nicotine or tobacco. These products include cigarettes, chewing tobacco, and vaping devices, such as e-cigarettes. If you need help quitting, ask your health care provider. Eat a healthy diet and maintain a healthy weight. Poor diet and excess weight may make pain worse. Eat foods that are high in fiber. These include fresh fruits and vegetables, whole grains, and beans. Limit foods that are high in fat and processed sugars, such as fried and sweet foods. Exercise regularly. Exercise lowers stress and may help relieve pain. Ask your health care provider what activities and exercises are safe for you. If your health care provider approves, join an exercise class that combines movement and stress reduction. Examples include yoga and tai chi. Get enough sleep. Lack of sleep may make pain worse. Lower stress as much as possible. Practice stress reduction techniques as told by your therapist. General instructions Work with all your pain management providers to find the treatments that work best for you. You are an important member of your pain management team. There are  many things you can do to reduce pain on your own. Consider joining an online or in-person support group for people who have chronic pain. Keep all follow-up visits. This is important. Where to find more information You can find more information about managing pain without opioids from: American Academy of Pain Medicine: painmed.org Institute for Chronic Pain: instituteforchronicpain.org American Chronic Pain Association: theacpa.org Contact a health care provider if: You have side effects from pain medicine. Your pain gets worse or does not get better with treatments or home therapy. You are struggling with anxiety or depression. Summary Many types of pain can be managed without opioids. Chronic pain may respond better to pain management without opioids. Pain is best managed when you and a team of health care providers work together. Pain management without opioids may include non-opioid medicines, medical treatments, physical therapy, mental health therapy, and lifestyle changes. Tell your health care providers if your pain gets worse or is not being managed well enough. This information is not intended to replace advice given to you by your health care provider. Make sure you discuss any questions you have with your health care provider. Document Revised: 08/26/2020 Document Reviewed: 08/26/2020 Elsevier Patient Education  2024 [employer].

## 2023-04-24 NOTE — Progress Notes (Signed)
Subjective:   Audrey Martinez is a 77 y.o. female who presents for Medicare Annual (Subsequent) preventive examination.  Visit Complete: Virtual I connected with  Melina Fiddler on 04/24/23 by a audio enabled telemedicine application and verified that I am speaking with the correct person using two identifiers.  Patient Location: Home  Provider Location: Home Office  I discussed the limitations of evaluation and management by telemedicine. The patient expressed understanding and agreed to proceed.  Vital Signs: Because this visit was a virtual/telehealth visit, some criteria may be missing or patient reported. Any vitals not documented were not able to be obtained and vitals that have been documented are patient reported.   Cardiac Risk Factors include: advanced age (>81men, >85 women);hypertension;dyslipidemia;obesity (BMI >30kg/m2)     Objective:    Today's Vitals   04/24/23 1551  Weight: 200 lb (90.7 kg)  Height: 5\' 6"  (1.676 m)   Body mass index is 32.28 kg/m.     04/24/2023    4:04 PM 04/20/2022   10:05 AM 03/15/2022    6:25 PM 03/03/2022    1:07 PM 02/24/2022    3:11 PM 02/12/2022    6:53 PM 02/12/2022   12:42 PM  Advanced Directives  Does Patient Have a Medical Advance Directive? Yes Yes No No No  No  Type of Estate agent of [place];Living will Living will       Does patient want to make changes to medical advance directive? No - Patient declined        Copy of Healthcare Power of Attorney in Chart? --        Would patient like information on creating a medical advance directive?   No - Patient declined No - Patient declined No - Patient declined No - Patient declined     Current Medications (verified) Outpatient Encounter Medications as of 04/24/2023  Medication Sig   B Complex-C (B-COMPLEX WITH VITAMIN C) tablet Take 1 tablet by mouth daily.    baclofen (LIORESAL) 10 MG tablet TAKE 1 TABLET BY MOUTH THREE TIMES A DAY    Bioflavonoid Products (ESTER-C) TABS Take 1 tablet by mouth 2 (two) times daily.    calcium carbonate (OS-CAL - DOSED IN MG OF ELEMENTAL CALCIUM) 1250 (500 Ca) MG tablet Take 1 tablet by mouth.   carvedilol (COREG) 6.25 MG tablet Take 1 tablet (6.25 mg total) by mouth 2 (two) times daily with a meal.   cholecalciferol (VITAMIN D) 25 MCG (1000 UT) tablet Take 1,000 Units by mouth daily.   Cranberry 1000 MG CAPS Take 1,000 mg by mouth daily.    ezetimibe (ZETIA) 10 MG tablet TAKE 1 TABLET BY MOUTH EVERY DAY   Fluticasone Furoate (ARNUITY ELLIPTA) 200 MCG/ACT AEPB Inhale 1 puff into the lungs daily.   gabapentin (NEURONTIN) 300 MG capsule Take 1 capsule (300 mg total) by mouth 2 (two) times daily. Home med.   Ginger, Zingiber officinalis, (GINGER PO) Take 1,100 mg by mouth daily.    HYDROcodone-acetaminophen (NORCO/VICODIN) 5-325 MG tablet Take 1 tablet by mouth every 6 (six) hours as needed.   Misc Natural Products (TART CHERRY ADVANCED PO) Take 1 capsule by mouth daily.    montelukast (SINGULAIR) 10 MG tablet Take 10 mg by mouth every evening.   Multiple Vitamins-Minerals (MULTIVITAMIN WITH MINERALS) tablet Take 1 tablet by mouth daily. Centrum Silver   Probiotic Product (PROBIOTIC DAILY PO) Take 1 capsule by mouth 2 (two) times a week.    rivaroxaban (XARELTO) 20 MG  TABS tablet Take 1 tablet (20 mg total) by mouth as needed (PRN for long car or plane ride).   sacubitril-valsartan (ENTRESTO) 24-26 MG TAKE 1 TABLET BY MOUTH TWICE A DAY   spironolactone (ALDACTONE) 25 MG tablet Take 0.5 tablets (12.5 mg total) by mouth daily.   Turmeric 500 MG CAPS Take 500 mg by mouth daily.    zinc sulfate 220 (50 Zn) MG capsule Take 1 capsule (220 mg total) by mouth daily.   famotidine (PEPCID) 20 MG tablet Take 20 mg by mouth as needed. (Patient not taking: Reported on 04/24/2023)   JARDIANCE 10 MG TABS tablet TAKE 1 TABLET BY MOUTH EVERY DAY (Patient not taking: Reported on 04/24/2023)   Olodaterol HCl  (STRIVERDI RESPIMAT) 2.5 MCG/ACT AERS Inhale into the lungs. (Patient not taking: Reported on 04/24/2023)   No facility-administered encounter medications on file as of 04/24/2023.    Allergies (verified) Amoxicillin, Aspirin, Bacitracin-polymyx-neo-hc [bacitra-neomycin-polymyxin-hc], Ciprofloxacin, Ciprofloxacin-dexamethasone, Crestor [rosuvastatin], Erythromycin, Hydrochlorothiazide, Ibuprofen, Morphine and codeine, Morpholine salicylate, Omeprazole, Doxycycline hyclate, Keflex [cephalexin], Lovastatin, Simvastatin, Neomycin-bacitracin zn-polymyx, and Tape   History: Past Medical History:  Diagnosis Date   Actinic keratosis    Acute pulmonary embolus (HCC) 01/29/2020   Acute renal failure (ARF) (HCC) 01/31/2020   Acute respiratory failure with hypoxia (HCC) 01/29/2020   Acute septic pulmonary embolism (HCC)    AKI (acute kidney injury) (HCC)    Amaurosis fugax, right eye 12/2018   Arthritis    Asthma    Basal cell carcinoma 07/2016   central forehead (tx in [place])   Cardiomyopathy [facility])    a. 04/2018 Echo: EF 45-50%. Anteroseptal and apical HK in some views. Mild AI/MR. Nl RV fxn; b. 05/2018 MV: mid-dist ant and antsept mild defect - ? breast atten. EF 41%. No ischemia; c. 03/2019 Echo: EF 30-35%, Gr2 DD, Trace MR, triv TR. Asc Ao ectatic dil - 38mm.    Coronary artery disease    Dyspnea    Dysrhythmia    Facial laceration 06/16/2022   Date of injury: 06/08/2022  [facility] ED history below:  77 year old female presenting to the emergency department after mechanical, nonsyncopal fall causing her to strike her face on the edge/corner of a curio cabinet and sustaining a deep and approximate 8 cm laceration to the right cheek in an irregular pattern to the angle of the right lower jaw. Wound does not proceed th   GERD (gastroesophageal reflux disease)    Herniated disc, lumbar    Hyperlipidemia    Left wrist fracture    Melanoma (HCC) 05/12/2020   Melanoma IS  R post neck, excised 08/10/20   Osteoporosis    Pneumonia due to COVID-19 virus 01/26/2020   Pulmonary embolus (HCC) 01/31/2020   Complication of Covid-19 infection 12/2019:  Xarelto 20 mg daily x 6 months then PRN   Stroke (HCC) 1998   SVT (supraventricular tachycardia) (HCC)    a.2008 s/p RFCA for AVNRT; b. 9 & 02/2019 Zio x 2: 1. Avg HR 89, 3 runs NSVT (longest 7 beats), Mobitz 1. 2. Avg HR 89, NSVT x 1 (5 beats), Mobitz 1. No signif arrhythmia.   Tendinitis of right knee 07/03/2017   Past Surgical History:  Procedure Laterality Date   BACK SURGERY  2017   L2-4 laminectomy and foraminal stenosis   BREAST CYST ASPIRATION Left 1977   CARDIAC CATHETERIZATION  2009   [facility] in [facility]   CARDIAC ELECTROPHYSIOLOGY STUDY AND ABLATION  2009   CATARACT EXTRACTION, BILATERAL  2010   COLONOSCOPY WITH PROPOFOL N/A 03/21/2018   Procedure: COLONOSCOPY WITH Biopsy;  Surgeon: Toney Reil, MD;  Location: [facility];  Service: Endoscopy;  Laterality: N/A;   COLONOSCOPY WITH PROPOFOL N/A 05/03/2021   Procedure: COLONOSCOPY WITH PROPOFOL;  Surgeon: Toney Reil, MD;  Location: [facility] ENDOSCOPY;  Service: Gastroenterology;  Laterality: N/A;   CORONARY ANGIOPLASTY     derrick procedure  1977-78   bilateral    ESOPHAGOGASTRODUODENOSCOPY  2013   gastritis; done for complaint of dysphagia   ESOPHAGOGASTRODUODENOSCOPY (EGD) WITH PROPOFOL N/A 03/21/2018   Procedure: ESOPHAGOGASTRODUODENOSCOPY (EGD) WITH Biopsies;  Surgeon: Toney Reil, MD;  Location: [facility];  Service: Endoscopy;  Laterality: N/A;  KEEP THIS PATIENT FIRST   JOINT REPLACEMENT     LEFT HEART CATH AND CORONARY ANGIOGRAPHY Left 05/02/2019   Procedure: LEFT HEART CATH AND CORONARY ANGIOGRAPHY;  Surgeon: Antonieta Iba, MD;  Location: [facility] INVASIVE CV LAB;  Service: Cardiovascular;  Laterality: Left;   lymph node removal     neck   MOHS SURGERY  07/2016   BCCA nose    POLYPECTOMY N/A 03/21/2018   Procedure: POLYPECTOMY;  Surgeon: Toney Reil, MD;  Location: [facility];  Service: Endoscopy;  Laterality: N/A;   REPLACEMENT TOTAL KNEE  2013   RT   TOTAL SHOULDER REPLACEMENT  2012   RT    Family History  Problem Relation Age of Onset   Diabetes Mother    Stroke Mother    Valvular heart disease Father    CAD Sister 60       died of MI   Stroke Sister    Stroke Sister 6   Clotting disorder Half-Sister    Clotting disorder Half-Sister    Breast cancer Neg Hx    Social History   Socioeconomic History   Marital status: Widowed    Spouse name: Not on file   Number of children: 3   Years of education: Not on file   Highest education level: Not on file  Occupational History   Occupation: retired  Tobacco Use   Smoking status: Never   Smokeless tobacco: Never  Vaping Use   Vaping status: Never Used  Substance and Sexual Activity   Alcohol use: Not Currently   Drug use: No   Sexual activity: Never  Other Topics Concern   Not on file  Social History Narrative   ** Merged History Encounter **       Lives at home alone, has resources if help is needed   Social Determinants of Health   Financial Resource Strain: Low Risk  (04/24/2023)   Overall Financial Resource Strain (CARDIA)    Difficulty of Paying Living Expenses: Not hard at all  Food Insecurity: No Food Insecurity (04/24/2023)   Hunger Vital Sign    Worried About Running Out of Food in the Last Year: Never true    Ran Out of Food in the Last Year: Never true  Transportation Needs: No Transportation Needs (04/24/2023)   PRAPARE - Administrator, Civil Service (Medical): No    Lack of Transportation (Non-Medical): No  Physical Activity: Insufficiently Active (04/24/2023)   Exercise Vital Sign    Days of Exercise per Week: 4 days    Minutes of Exercise per Session: 20 min  Stress: No Stress Concern Present (04/24/2023)   [employer] of  Occupational Health - Occupational Stress Questionnaire    Feeling of Stress : Not at all  Social  Connections: Moderately Isolated (04/24/2023)   Social Connection and Isolation Panel [NHANES]    Frequency of Communication with Friends and Family: More than three times a week    Frequency of Social Gatherings with Friends and Family: Three times a week    Attends Religious Services: More than 4 times per year    Active Member of Clubs or Organizations: No    Attends Banker Meetings: Never    Marital Status: Widowed    Tobacco Counseling Counseling given: Not Answered   Clinical Intake:  Pre-visit preparation completed: Yes  Pain : No/denies pain     BMI - recorded: 32.28 Nutritional Status: BMI > 30  Obese Nutritional Risks: None Diabetes: No  How often do you need to have someone help you when you read instructions, pamphlets, or other written materials from your doctor or pharmacy?: 1 - Never  Interpreter Needed?: No  Information entered by :: Tora Kindred, CMA   Activities of Daily Living    04/24/2023    3:53 PM  In your present state of health, do you have any difficulty performing the following activities:  Hearing? 0  Vision? 0  Difficulty concentrating or making decisions? 0  Walking or climbing stairs? 1  Comment uses cane or rollator  Dressing or bathing? 0  Doing errands, shopping? 0  Preparing Food and eating ? N  Using the Toilet? N  In the past six months, have you accidently leaked urine? N  Do you have problems with loss of bowel control? N  Managing your Medications? N  Managing your Finances? N  Housekeeping or managing your Housekeeping? N    Patient Care Team: [name], MD as PCP - General (Internal Medicine) Antonieta Iba, MD as PCP - Cardiology (Cardiology) Erin Fulling, MD as Consulting Physician (Pulmonary Disease) Edward Jolly, MD as Consulting Physician (Pain Medicine) Deeann Saint, MD (Orthopedic  Surgery) Antonieta Iba, MD as Consulting Physician (Cardiology) Willeen Niece, MD (Dermatology)  Indicate any recent Medical Services you may have received from other than [facility] providers in the past year (date may be approximate).     Assessment:   This is a routine wellness examination for Faige.  Hearing/Vision screen Hearing Screening - Comments:: Denies hearing loss Vision Screening - Comments:: Gets eye exams   Goals Addressed               This Visit's Progress     Patient Stated (pt-stated)        Maintain current health      Depression Screen    04/24/2023    4:02 PM 06/24/2022    8:12 AM 04/20/2022   10:06 AM 03/28/2022   10:39 AM 06/22/2021    8:25 AM 04/07/2021    8:35 AM 05/06/2020   10:01 AM  PHQ 2/9 Scores  PHQ - 2 Score 0 0 0 0 0 0 0  PHQ- 9 Score 0 2 0 2 0  0    Fall Risk    04/24/2023    4:06 PM 06/24/2022    8:12 AM 04/20/2022   10:06 AM 03/28/2022   10:39 AM 06/22/2021    8:25 AM  Fall Risk   Falls in the past year? 0 1 1 1  0  Number falls in past yr: 0 0 1 1 0  Injury with Fall? 0 1 1 1  0  Risk for fall due to : Impaired balance/gait;Impaired mobility History of fall(s) History of fall(s) History of fall(s) No  Fall Risks  Follow up Falls prevention discussed;Education provided;Falls evaluation completed Falls evaluation completed Falls evaluation completed Falls evaluation completed Falls evaluation completed    MEDICARE RISK AT HOME: Medicare Risk at Home Any stairs in or around the home?: No If so, are there any without handrails?: No Home free of loose throw rugs in walkways, pet beds, electrical cords, etc?: Yes Adequate lighting in your home to reduce risk of falls?: Yes Life alert?: Yes Use of a cane, walker or w/c?: Yes Grab bars in the bathroom?: No Shower chair or bench in shower?: No Elevated toilet seat or a handicapped toilet?: No  TIMED UP AND GO:  Was the test performed?  No    Cognitive Function:         04/24/2023    4:07 PM 04/20/2022   10:07 AM 04/06/2020    8:41 AM 01/28/2019   11:30 AM  6CIT Screen  What Year? 0 points 0 points 0 points 0 points  What month? 0 points 0 points 0 points 0 points  What time? 3 points 0 points 0 points 0 points  Count back from 20 0 points 0 points 0 points 0 points  Months in reverse 2 points 0 points 0 points 2 points  Repeat phrase 6 points 4 points 0 points 0 points  Total Score 11 points 4 points 0 points 2 points    Immunizations Immunization History  Administered Date(s) Administered   Pneumococcal Conjugate-13 07/22/2015   Pneumococcal Polysaccharide-23 07/22/2013   Td 03/03/2002   Tdap 06/08/2022    TDAP status: Up to date  Flu Vaccine status: Declined, Education has been provided regarding the importance of this vaccine but patient still declined. Advised may receive this vaccine at local pharmacy or Health Dept. Aware to provide a copy of the vaccination record if obtained from local pharmacy or Health Dept. Verbalized acceptance and understanding.  Pneumococcal vaccine status: Up to date  Covid-19 vaccine status: Declined, Education has been provided regarding the importance of this vaccine but patient still declined. Advised may receive this vaccine at local pharmacy or Health Dept.or vaccine clinic. Aware to provide a copy of the vaccination record if obtained from local pharmacy or Health Dept. Verbalized acceptance and understanding.  Qualifies for Shingles Vaccine? Yes   Zostavax completed No   Shingrix Completed?: No.    Education has been provided regarding the importance of this vaccine. Patient has been advised to call insurance company to determine out of pocket expense if they have not yet received this vaccine. Advised may also receive vaccine at local pharmacy or Health Dept. Verbalized acceptance and understanding. Patient declined  Screening Tests Health Maintenance  Topic Date Due   COVID-19 Vaccine (1) Never done    Zoster Vaccines- Shingrix (1 of 2) Never done   INFLUENZA VACCINE  08/28/2023 (Originally 12/29/2022)   MAMMOGRAM  12/27/2023   Medicare Annual Wellness (AWV)  04/23/2024   Colonoscopy  05/03/2024   DTaP/Tdap/Td (3 - Td or Tdap) 06/08/2032   Pneumonia Vaccine 26+ Years old  Completed   DEXA SCAN  Completed   Hepatitis C Screening  Completed   HPV VACCINES  Aged Out    Health Maintenance  Health Maintenance Due  Topic Date Due   COVID-19 Vaccine (1) Never done   Zoster Vaccines- Shingrix (1 of 2) Never done    Colorectal cancer screening: No longer required.   Mammogram status: Completed 12/27/22. Repeat every year  Bone Density status: Completed 02/26/19. Results reflect: Bone  density results: OSTEOPOROSIS. Repeat every 2 years. Patient declined.  Lung Cancer Screening: (Low Dose CT Chest recommended if Age 46-80 years, 20 pack-year currently smoking OR have quit w/in 15years.) does not qualify.   Lung Cancer Screening Referral: n/a  Additional Screening:  Hepatitis C Screening: does not qualify; Completed 11/15/18  Vision Screening: Recommended annual ophthalmology exams for early detection of glaucoma and other disorders of the eye.  Dental Screening: Recommended annual dental exams for proper oral hygiene   Community Resource Referral / Chronic Care Management: CRR required this visit?  No   CCM required this visit?  No     Plan:     I have personally reviewed and noted the following in the patient's chart:   Medical and social history Use of alcohol, tobacco or illicit drugs  Current medications and supplements including opioid prescriptions. Patient is currently taking opioid prescriptions. Information provided to patient regarding non-opioid alternatives. Patient advised to discuss non-opioid treatment plan with their provider. Functional ability and status Nutritional status Physical activity Advanced directives List of other physicians Hospitalizations,  surgeries, and ER visits in previous 12 months Vitals Screenings to include cognitive, depression, and falls Referrals and appointments  In addition, I have reviewed and discussed with patient certain preventive protocols, quality metrics, and best practice recommendations. A written personalized care plan for preventive services as well as general preventive health recommendations were provided to patient.     Tora Kindred, CMA   04/24/2023   After Visit Summary: (MyChart) Due to this being a telephonic visit, the after visit summary with patients personalized plan was offered to patient via MyChart   Nurse Notes:  6 CIT Score - 11 Declined flu, covid and shingles vaccines. Declined DEXA scan Declined bringing in a copy of advanced directives

## 2023-04-26 DIAGNOSIS — M1611 Unilateral primary osteoarthritis, right hip: Secondary | ICD-10-CM | POA: Diagnosis not present

## 2023-04-26 DIAGNOSIS — M7061 Trochanteric bursitis, right hip: Secondary | ICD-10-CM | POA: Diagnosis not present

## 2023-05-30 ENCOUNTER — Other Ambulatory Visit: Payer: Self-pay

## 2023-05-30 ENCOUNTER — Emergency Department
Admission: EM | Admit: 2023-05-30 | Discharge: 2023-05-30 | Disposition: A | Discharge status: Home / Self Care | Payer: Medicare Other | Attending: Emergency Medicine | Admitting: Emergency Medicine

## 2023-05-30 ENCOUNTER — Emergency Department: Payer: Medicare Other

## 2023-05-30 DIAGNOSIS — W08XXXA Fall from other furniture, initial encounter: Secondary | ICD-10-CM | POA: Diagnosis not present

## 2023-05-30 DIAGNOSIS — R4189 Other symptoms and signs involving cognitive functions and awareness: Secondary | ICD-10-CM

## 2023-05-30 DIAGNOSIS — I509 Heart failure, unspecified: Secondary | ICD-10-CM | POA: Insufficient documentation

## 2023-05-30 DIAGNOSIS — M25551 Pain in right hip: Secondary | ICD-10-CM | POA: Diagnosis not present

## 2023-05-30 DIAGNOSIS — W19XXXA Unspecified fall, initial encounter: Secondary | ICD-10-CM

## 2023-05-30 DIAGNOSIS — R0902 Hypoxemia: Secondary | ICD-10-CM | POA: Diagnosis not present

## 2023-05-30 DIAGNOSIS — R4181 Age-related cognitive decline: Secondary | ICD-10-CM | POA: Insufficient documentation

## 2023-05-30 DIAGNOSIS — M545 Low back pain, unspecified: Secondary | ICD-10-CM | POA: Diagnosis not present

## 2023-05-30 DIAGNOSIS — M25552 Pain in left hip: Secondary | ICD-10-CM | POA: Insufficient documentation

## 2023-05-30 NOTE — ED Provider Triage Note (Addendum)
 Emergency Medicine Provider Triage Evaluation Note  Saretta Dahlem , a 77 y.o. female  was evaluated in triage.  Pt complains of left hip pain.  She went to sit on a commode and it was lower than she expected and she fell onto her buttocks.  No head strike or LOC.  Reports pain in her right buttock only.  Has not attempted to weight-bear.  No other injury sustained.  No preceding symptoms.   Review of Systems  Positive: right hip pain Negative: Headache, LOC  Physical Exam  There were no vitals taken for this visit. Gen:   Awake, no distress   Resp:  Normal effort  MSK:   Moves extremities without difficulty  Other:    Medical Decision Making  Medically screening exam initiated at 1:21 PM.  Appropriate orders placed.  Barri Ryli Standlee was informed that the remainder of the evaluation will be completed by another provider, this initial triage assessment does not replace that evaluation, and the importance of remaining in the ED until their evaluation is complete.     Nirvaan Frett E, PA-C 05/30/23 1324    Phaedra Colgate E, PA-C 05/30/23 1342

## 2023-05-30 NOTE — ED Provider Notes (Addendum)
 [facility] Provider Note    Event Date/Time   First MD Initiated Contact with Patient 05/30/23 1604     (approximate)   History   Fall   HPI  Audrey Martinez is a 77 y.o. female with h/o chronic heart failure prior PE not on anticoag anymore who comes in with concerns for a fall.  Patient reports that she was on a stool when she slipped off and fell onto her bottom and hurt the left hip.  She did not hit her head did not lose consciousness denies any blood thinners.  She was on the ground for 10 minutes.    Patient's son is at bedside who states that he found her laying on the ground naked.  Reports that she has had progressive decline over the past few months with increasing confusion.  She does currently live by herself and ambulates with a walker.  Physical Exam   Triage Vital Signs: ED Triage Vitals  Encounter Vitals Group     BP 05/30/23 1320 (!) 154/108     Systolic BP Percentile --      Diastolic BP Percentile --      Pulse Rate 05/30/23 1320 78     Resp 05/30/23 1320 16     Temp 05/30/23 1320 (!) 96.5 F (35.8 C)     Temp Source 05/30/23 1320 Axillary     SpO2 05/30/23 1320 98 %     Weight --      Height --      Head Circumference --      Peak Flow --      Pain Score 05/30/23 1317 2     Pain Loc --      Pain Education --      Exclude from Growth Chart --     Most recent vital signs: Vitals:   05/30/23 1320 05/30/23 1648  BP: (!) 154/108   Pulse: 78   Resp: 16   Temp: (!) 96.5 F (35.8 C) 98.6 F (37 C)  SpO2: 98%      General: Awake, no distress.  CV:  Good peripheral perfusion.  Resp:  Normal effort.  Abd:  No distention.  Other:  No hematoma to the head.  No C-spine tenderness.  Full range of motion of neck.  No chest wall tenderness no abdominal tenderness.  Some left hip pain.   ED Results / Procedures / Treatments   Labs (all labs ordered are listed, but only abnormal results are displayed) Labs Reviewed -  No data to display    RADIOLOGY I have reviewed the xray personally and interpreted no pneumonia  PROCEDURES:  Critical Care performed: No  Procedures   MEDICATIONS ORDERED IN ED: Medications - No data to display   IMPRESSION / MDM / ASSESSMENT AND PLAN / ED COURSE  I reviewed the triage vital signs and the nursing notes.   Patient's presentation is most consistent with acute, uncomplicated illness.    Patient comes in with mechanical fall.  Patient's x-ray without evidence of fracture or dislocation.  Patient is able to stand up and ambulate.  Patient is acting her baseline self however she does have increasing confusion over the past few months and lives by herself which the son is concerned about.  We did discuss with her social worker who will help talk with the family about outpatient options.  At this time I did offer CT imaging of her head or neck if there is any  concern that she hit her head or blood work if there was concern that she was actually on the ground longer for rhabdo. They understand that this would take a little bit longer to result and they have opted to decline this and prefer to go home.  I did talk with SW who will try and help them outpatient and the patient's son is going to call the primary doctor to make a follow-up appointment.  D/w DW and placed home health order. Pt denies taking blood thinner xaleto anymore.     FINAL CLINICAL IMPRESSION(S) / ED DIAGNOSES   Final diagnoses:  Fall, initial encounter  Cognitive decline  Chronic heart failure, unspecified heart failure type (HCC)     Rx / DC Orders   ED Discharge Orders     None        Note:  This document was prepared using Dragon voice recognition software and may include unintentional dictation errors.   Ernest Ronal BRAVO, MD 05/30/23 1631    Ernest Ronal BRAVO, MD 05/30/23 1653    Ernest Ronal BRAVO, MD 05/30/23 [phone]

## 2023-05-30 NOTE — Discharge Instructions (Signed)
Xray Was negative.  Please follow-up with your primary care doctor to discuss additional help in the home.  Did place a home health consult to try to help facilitate this return to the ER for worsening symptoms or any other concerns

## 2023-05-30 NOTE — ED Triage Notes (Addendum)
Pt from home where she lives by herself via [facility]. Was sitting down on a short stool when she fell hard on her bottom. Mild pain on her left bottom. Did not LOC, is not on blood thinners.

## 2023-05-30 NOTE — ED Notes (Signed)
 Son Pamelia Hoit [phone]) is with pt now. Discussed home health care followed outpt with Dr. Fuller Plan.

## 2023-06-06 DIAGNOSIS — I509 Heart failure, unspecified: Secondary | ICD-10-CM | POA: Diagnosis not present

## 2023-06-06 DIAGNOSIS — Z86711 Personal history of pulmonary embolism: Secondary | ICD-10-CM | POA: Diagnosis not present

## 2023-06-06 DIAGNOSIS — W19XXXD Unspecified fall, subsequent encounter: Secondary | ICD-10-CM | POA: Diagnosis not present

## 2023-06-06 DIAGNOSIS — R4181 Age-related cognitive decline: Secondary | ICD-10-CM | POA: Diagnosis not present

## 2023-06-06 DIAGNOSIS — Z9181 History of falling: Secondary | ICD-10-CM | POA: Diagnosis not present

## 2023-06-06 DIAGNOSIS — Z556 Problems related to health literacy: Secondary | ICD-10-CM | POA: Diagnosis not present

## 2023-06-06 DIAGNOSIS — Z791 Long term (current) use of non-steroidal anti-inflammatories (NSAID): Secondary | ICD-10-CM | POA: Diagnosis not present

## 2023-06-07 DIAGNOSIS — Z556 Problems related to health literacy: Secondary | ICD-10-CM | POA: Diagnosis not present

## 2023-06-07 DIAGNOSIS — Z791 Long term (current) use of non-steroidal anti-inflammatories (NSAID): Secondary | ICD-10-CM | POA: Diagnosis not present

## 2023-06-07 DIAGNOSIS — I509 Heart failure, unspecified: Secondary | ICD-10-CM | POA: Diagnosis not present

## 2023-06-07 DIAGNOSIS — Z86711 Personal history of pulmonary embolism: Secondary | ICD-10-CM | POA: Diagnosis not present

## 2023-06-07 DIAGNOSIS — W19XXXD Unspecified fall, subsequent encounter: Secondary | ICD-10-CM | POA: Diagnosis not present

## 2023-06-07 DIAGNOSIS — R4181 Age-related cognitive decline: Secondary | ICD-10-CM | POA: Diagnosis not present

## 2023-06-12 DIAGNOSIS — Z556 Problems related to health literacy: Secondary | ICD-10-CM | POA: Diagnosis not present

## 2023-06-12 DIAGNOSIS — I509 Heart failure, unspecified: Secondary | ICD-10-CM | POA: Diagnosis not present

## 2023-06-12 DIAGNOSIS — W19XXXD Unspecified fall, subsequent encounter: Secondary | ICD-10-CM | POA: Diagnosis not present

## 2023-06-12 DIAGNOSIS — Z791 Long term (current) use of non-steroidal anti-inflammatories (NSAID): Secondary | ICD-10-CM | POA: Diagnosis not present

## 2023-06-12 DIAGNOSIS — R4181 Age-related cognitive decline: Secondary | ICD-10-CM | POA: Diagnosis not present

## 2023-06-12 DIAGNOSIS — Z86711 Personal history of pulmonary embolism: Secondary | ICD-10-CM | POA: Diagnosis not present

## 2023-06-13 DIAGNOSIS — Z556 Problems related to health literacy: Secondary | ICD-10-CM | POA: Diagnosis not present

## 2023-06-13 DIAGNOSIS — W19XXXD Unspecified fall, subsequent encounter: Secondary | ICD-10-CM | POA: Diagnosis not present

## 2023-06-13 DIAGNOSIS — I509 Heart failure, unspecified: Secondary | ICD-10-CM | POA: Diagnosis not present

## 2023-06-13 DIAGNOSIS — Z791 Long term (current) use of non-steroidal anti-inflammatories (NSAID): Secondary | ICD-10-CM | POA: Diagnosis not present

## 2023-06-13 DIAGNOSIS — Z86711 Personal history of pulmonary embolism: Secondary | ICD-10-CM | POA: Diagnosis not present

## 2023-06-13 DIAGNOSIS — R4181 Age-related cognitive decline: Secondary | ICD-10-CM | POA: Diagnosis not present

## 2023-06-15 ENCOUNTER — Other Ambulatory Visit (INDEPENDENT_AMBULATORY_CARE_PROVIDER_SITE_OTHER): Payer: Medicare Other | Admitting: Internal Medicine

## 2023-06-15 DIAGNOSIS — W19XXXD Unspecified fall, subsequent encounter: Secondary | ICD-10-CM | POA: Insufficient documentation

## 2023-06-15 DIAGNOSIS — Z791 Long term (current) use of non-steroidal anti-inflammatories (NSAID): Secondary | ICD-10-CM | POA: Diagnosis not present

## 2023-06-15 DIAGNOSIS — I5020 Unspecified systolic (congestive) heart failure: Secondary | ICD-10-CM | POA: Diagnosis not present

## 2023-06-15 DIAGNOSIS — I10 Essential (primary) hypertension: Secondary | ICD-10-CM

## 2023-06-15 DIAGNOSIS — R4181 Age-related cognitive decline: Secondary | ICD-10-CM | POA: Diagnosis not present

## 2023-06-15 DIAGNOSIS — I509 Heart failure, unspecified: Secondary | ICD-10-CM | POA: Diagnosis not present

## 2023-06-15 DIAGNOSIS — Z86711 Personal history of pulmonary embolism: Secondary | ICD-10-CM | POA: Diagnosis not present

## 2023-06-15 DIAGNOSIS — Z556 Problems related to health literacy: Secondary | ICD-10-CM | POA: Diagnosis not present

## 2023-06-15 DIAGNOSIS — Z9181 History of falling: Secondary | ICD-10-CM | POA: Insufficient documentation

## 2023-06-15 DIAGNOSIS — M48061 Spinal stenosis, lumbar region without neurogenic claudication: Secondary | ICD-10-CM

## 2023-06-15 NOTE — Progress Notes (Signed)
Received home health orders orders from [facility]. Start of care 06/06/23.   Certification and orders from 06/06/23 through 32/7/25 are reviewed, signed and faxed back to home health company.  Need of intermittent skilled services at home: homebound  The home health care plan has been established by me and will be reviewed and updated as needed to maximize patient recovery.  I certify that all home health services have been and will be furnished to the patient while under my care.  Face-to-face encounter in which the need for home health services was established: 05/30/23 ED visit.  Patient is receiving home health services for the following diagnoses: Problem List Items Addressed This Visit       Unprioritized   Essential hypertension (Chronic)   Spinal stenosis of lumbar region without neurogenic claudication (Chronic)   HFrEF (heart failure with reduced ejection fraction) (HCC) - Primary (Chronic)   Age-related cognitive decline   Encounter for long-term (current) use of non-steroidal anti-inflammatories   Personal history of PE (pulmonary embolism)   Cause of injury, accidental fall, subsequent encounter   Problem related to health literacy   Personal history of fall     Bari Edward, MD

## 2023-06-16 DIAGNOSIS — W19XXXD Unspecified fall, subsequent encounter: Secondary | ICD-10-CM | POA: Diagnosis not present

## 2023-06-16 DIAGNOSIS — Z791 Long term (current) use of non-steroidal anti-inflammatories (NSAID): Secondary | ICD-10-CM | POA: Diagnosis not present

## 2023-06-16 DIAGNOSIS — I509 Heart failure, unspecified: Secondary | ICD-10-CM | POA: Diagnosis not present

## 2023-06-16 DIAGNOSIS — Z86711 Personal history of pulmonary embolism: Secondary | ICD-10-CM | POA: Diagnosis not present

## 2023-06-16 DIAGNOSIS — Z556 Problems related to health literacy: Secondary | ICD-10-CM | POA: Diagnosis not present

## 2023-06-16 DIAGNOSIS — R4181 Age-related cognitive decline: Secondary | ICD-10-CM | POA: Diagnosis not present

## 2023-06-19 DIAGNOSIS — Z791 Long term (current) use of non-steroidal anti-inflammatories (NSAID): Secondary | ICD-10-CM | POA: Diagnosis not present

## 2023-06-19 DIAGNOSIS — Z86711 Personal history of pulmonary embolism: Secondary | ICD-10-CM | POA: Diagnosis not present

## 2023-06-19 DIAGNOSIS — Z556 Problems related to health literacy: Secondary | ICD-10-CM | POA: Diagnosis not present

## 2023-06-19 DIAGNOSIS — W19XXXD Unspecified fall, subsequent encounter: Secondary | ICD-10-CM | POA: Diagnosis not present

## 2023-06-19 DIAGNOSIS — I509 Heart failure, unspecified: Secondary | ICD-10-CM | POA: Diagnosis not present

## 2023-06-19 DIAGNOSIS — R4181 Age-related cognitive decline: Secondary | ICD-10-CM | POA: Diagnosis not present

## 2023-06-20 DIAGNOSIS — M7061 Trochanteric bursitis, right hip: Secondary | ICD-10-CM | POA: Diagnosis not present

## 2023-06-20 DIAGNOSIS — M1611 Unilateral primary osteoarthritis, right hip: Secondary | ICD-10-CM | POA: Diagnosis not present

## 2023-06-21 DIAGNOSIS — Z556 Problems related to health literacy: Secondary | ICD-10-CM | POA: Diagnosis not present

## 2023-06-21 DIAGNOSIS — Z86711 Personal history of pulmonary embolism: Secondary | ICD-10-CM | POA: Diagnosis not present

## 2023-06-21 DIAGNOSIS — W19XXXD Unspecified fall, subsequent encounter: Secondary | ICD-10-CM | POA: Diagnosis not present

## 2023-06-21 DIAGNOSIS — Z791 Long term (current) use of non-steroidal anti-inflammatories (NSAID): Secondary | ICD-10-CM | POA: Diagnosis not present

## 2023-06-21 DIAGNOSIS — I509 Heart failure, unspecified: Secondary | ICD-10-CM | POA: Diagnosis not present

## 2023-06-21 DIAGNOSIS — R4181 Age-related cognitive decline: Secondary | ICD-10-CM | POA: Diagnosis not present

## 2023-06-22 DIAGNOSIS — Z556 Problems related to health literacy: Secondary | ICD-10-CM | POA: Diagnosis not present

## 2023-06-22 DIAGNOSIS — I509 Heart failure, unspecified: Secondary | ICD-10-CM | POA: Diagnosis not present

## 2023-06-22 DIAGNOSIS — W19XXXD Unspecified fall, subsequent encounter: Secondary | ICD-10-CM | POA: Diagnosis not present

## 2023-06-22 DIAGNOSIS — R4181 Age-related cognitive decline: Secondary | ICD-10-CM | POA: Diagnosis not present

## 2023-06-22 DIAGNOSIS — Z86711 Personal history of pulmonary embolism: Secondary | ICD-10-CM | POA: Diagnosis not present

## 2023-06-22 DIAGNOSIS — Z791 Long term (current) use of non-steroidal anti-inflammatories (NSAID): Secondary | ICD-10-CM | POA: Diagnosis not present

## 2023-06-25 ENCOUNTER — Emergency Department: Payer: Medicare Other

## 2023-06-25 ENCOUNTER — Observation Stay
Admission: EM | Admit: 2023-06-25 | Discharge: 2023-06-26 | Disposition: A | Discharge status: Home / Self Care | Payer: Medicare Other | Attending: Osteopathic Medicine | Admitting: Osteopathic Medicine

## 2023-06-25 DIAGNOSIS — I480 Paroxysmal atrial fibrillation: Secondary | ICD-10-CM | POA: Diagnosis not present

## 2023-06-25 DIAGNOSIS — Z86711 Personal history of pulmonary embolism: Secondary | ICD-10-CM | POA: Diagnosis not present

## 2023-06-25 DIAGNOSIS — I639 Cerebral infarction, unspecified: Secondary | ICD-10-CM | POA: Diagnosis not present

## 2023-06-25 DIAGNOSIS — Z85828 Personal history of other malignant neoplasm of skin: Secondary | ICD-10-CM | POA: Diagnosis not present

## 2023-06-25 DIAGNOSIS — Z6831 Body mass index (BMI) 31.0-31.9, adult: Secondary | ICD-10-CM | POA: Insufficient documentation

## 2023-06-25 DIAGNOSIS — I2510 Atherosclerotic heart disease of native coronary artery without angina pectoris: Secondary | ICD-10-CM | POA: Diagnosis not present

## 2023-06-25 DIAGNOSIS — N1831 Chronic kidney disease, stage 3a: Secondary | ICD-10-CM | POA: Diagnosis not present

## 2023-06-25 DIAGNOSIS — G9389 Other specified disorders of brain: Secondary | ICD-10-CM | POA: Diagnosis not present

## 2023-06-25 DIAGNOSIS — Z7901 Long term (current) use of anticoagulants: Secondary | ICD-10-CM | POA: Insufficient documentation

## 2023-06-25 DIAGNOSIS — I5022 Chronic systolic (congestive) heart failure: Secondary | ICD-10-CM | POA: Insufficient documentation

## 2023-06-25 DIAGNOSIS — I130 Hypertensive heart and chronic kidney disease with heart failure and stage 1 through stage 4 chronic kidney disease, or unspecified chronic kidney disease: Secondary | ICD-10-CM | POA: Insufficient documentation

## 2023-06-25 DIAGNOSIS — R200 Anesthesia of skin: Secondary | ICD-10-CM | POA: Diagnosis not present

## 2023-06-25 DIAGNOSIS — Z96651 Presence of right artificial knee joint: Secondary | ICD-10-CM | POA: Diagnosis not present

## 2023-06-25 DIAGNOSIS — Z8616 Personal history of COVID-19: Secondary | ICD-10-CM | POA: Diagnosis not present

## 2023-06-25 DIAGNOSIS — G459 Transient cerebral ischemic attack, unspecified: Principal | ICD-10-CM | POA: Insufficient documentation

## 2023-06-25 DIAGNOSIS — I5020 Unspecified systolic (congestive) heart failure: Secondary | ICD-10-CM | POA: Diagnosis present

## 2023-06-25 DIAGNOSIS — Z79899 Other long term (current) drug therapy: Secondary | ICD-10-CM | POA: Diagnosis not present

## 2023-06-25 DIAGNOSIS — Z96611 Presence of right artificial shoulder joint: Secondary | ICD-10-CM | POA: Insufficient documentation

## 2023-06-25 DIAGNOSIS — E669 Obesity, unspecified: Secondary | ICD-10-CM | POA: Diagnosis not present

## 2023-06-25 DIAGNOSIS — I63522 Cerebral infarction due to unspecified occlusion or stenosis of left anterior cerebral artery: Secondary | ICD-10-CM | POA: Diagnosis not present

## 2023-06-25 DIAGNOSIS — J4520 Mild intermittent asthma, uncomplicated: Secondary | ICD-10-CM | POA: Diagnosis not present

## 2023-06-25 DIAGNOSIS — R2990 Unspecified symptoms and signs involving the nervous system: Principal | ICD-10-CM

## 2023-06-25 DIAGNOSIS — R202 Paresthesia of skin: Secondary | ICD-10-CM | POA: Diagnosis not present

## 2023-06-25 DIAGNOSIS — I10 Essential (primary) hypertension: Secondary | ICD-10-CM | POA: Diagnosis present

## 2023-06-25 DIAGNOSIS — J45909 Unspecified asthma, uncomplicated: Secondary | ICD-10-CM | POA: Diagnosis not present

## 2023-06-25 DIAGNOSIS — E785 Hyperlipidemia, unspecified: Secondary | ICD-10-CM | POA: Diagnosis present

## 2023-06-25 DIAGNOSIS — E782 Mixed hyperlipidemia: Secondary | ICD-10-CM | POA: Diagnosis present

## 2023-06-25 DIAGNOSIS — H532 Diplopia: Secondary | ICD-10-CM | POA: Diagnosis not present

## 2023-06-25 DIAGNOSIS — I6523 Occlusion and stenosis of bilateral carotid arteries: Secondary | ICD-10-CM | POA: Diagnosis not present

## 2023-06-25 DIAGNOSIS — R29818 Other symptoms and signs involving the nervous system: Secondary | ICD-10-CM | POA: Diagnosis not present

## 2023-06-25 LAB — DIFFERENTIAL
Abs Immature Granulocytes: 0.01 10*3/uL (ref 0.00–0.07)
Basophils Absolute: 0 10*3/uL (ref 0.0–0.1)
Basophils Relative: 0 %
Eosinophils Absolute: 0.2 10*3/uL (ref 0.0–0.5)
Eosinophils Relative: 3 %
Immature Granulocytes: 0 %
Lymphocytes Relative: 30 %
Lymphs Abs: 1.9 10*3/uL (ref 0.7–4.0)
Monocytes Absolute: 0.7 10*3/uL (ref 0.1–1.0)
Monocytes Relative: 12 %
Neutro Abs: 3.5 10*3/uL (ref 1.7–7.7)
Neutrophils Relative %: 55 %

## 2023-06-25 LAB — COMPREHENSIVE METABOLIC PANEL
ALT: 17 U/L (ref 0–44)
AST: 22 U/L (ref 15–41)
Albumin: 3.7 g/dL (ref 3.5–5.0)
Alkaline Phosphatase: 66 U/L (ref 38–126)
Anion gap: 9 (ref 5–15)
BUN: 27 mg/dL — ABNORMAL HIGH (ref 8–23)
CO2: 26 mmol/L (ref 22–32)
Calcium: 9.1 mg/dL (ref 8.9–10.3)
Chloride: 104 mmol/L (ref 98–111)
Creatinine, Ser: 1.2 mg/dL — ABNORMAL HIGH (ref 0.44–1.00)
GFR, Estimated: 47 mL/min — ABNORMAL LOW (ref >60)
Glucose, Bld: 106 mg/dL — ABNORMAL HIGH (ref 70–99)
Potassium: 4.6 mmol/L (ref 3.5–5.1)
Sodium: 139 mmol/L (ref 135–145)
Total Bilirubin: 0.8 mg/dL (ref 0.0–1.2)
Total Protein: 6.1 g/dL — ABNORMAL LOW (ref 6.5–8.1)

## 2023-06-25 LAB — CBC
HCT: 42.8 % (ref 36.0–46.0)
Hemoglobin: 13.9 g/dL (ref 12.0–15.0)
MCH: 31.4 pg (ref 26.0–34.0)
MCHC: 32.5 g/dL (ref 30.0–36.0)
MCV: 96.6 fL (ref 80.0–100.0)
Platelets: 177 10*3/uL (ref 150–400)
RBC: 4.43 MIL/uL (ref 3.87–5.11)
RDW: 12.6 % (ref 11.5–15.5)
WBC: 6.3 10*3/uL (ref 4.0–10.5)
nRBC: 0 % (ref 0.0–0.2)

## 2023-06-25 LAB — ETHANOL: Alcohol, Ethyl (B): <10 mg/dL (ref <10)

## 2023-06-25 LAB — APTT: aPTT: 26 s (ref 24–36)

## 2023-06-25 LAB — PROTIME-INR
INR: 0.9 (ref 0.8–1.2)
Prothrombin Time: 12.5 s (ref 11.4–15.2)

## 2023-06-25 LAB — CBG MONITORING, ED: Glucose-Capillary: 106 mg/dL — ABNORMAL HIGH (ref 70–99)

## 2023-06-25 MED ORDER — HYDROCODONE-ACETAMINOPHEN 5-325 MG PO TABS: 1.0000 | ORAL_TABLET | Freq: Four times a day (QID) | ORAL | Status: DC | PRN

## 2023-06-25 MED ORDER — IOHEXOL 350 MG/ML SOLN
75.0000 mL | Freq: Once | INTRAVENOUS | Status: AC | PRN
  Administered 2023-06-25: 75 mL via INTRAVENOUS

## 2023-06-25 MED ORDER — STROKE: EARLY STAGES OF RECOVERY BOOK: Freq: Once | Status: DC

## 2023-06-25 MED ORDER — ACETAMINOPHEN 160 MG/5ML PO SOLN: 650.0000 mg | ORAL | Status: DC | PRN

## 2023-06-25 MED ORDER — ENOXAPARIN SODIUM 60 MG/0.6ML IJ SOSY
0.5000 mg/kg | PREFILLED_SYRINGE | INTRAMUSCULAR | Status: DC
  Administered 2023-06-25: 45 mg via SUBCUTANEOUS
  Filled 2023-06-25: qty 0.6

## 2023-06-25 MED ORDER — EZETIMIBE 10 MG PO TABS
10.0000 mg | ORAL_TABLET | Freq: Every day | ORAL | Status: DC
  Administered 2023-06-26: 10 mg via ORAL
  Filled 2023-06-25: qty 1

## 2023-06-25 MED ORDER — CLOPIDOGREL BISULFATE 75 MG PO TABS
75.0000 mg | ORAL_TABLET | Freq: Every day | ORAL | Status: DC
  Administered 2023-06-26: 75 mg via ORAL
  Filled 2023-06-25: qty 1

## 2023-06-25 MED ORDER — CLOPIDOGREL BISULFATE 75 MG PO TABS
300.0000 mg | ORAL_TABLET | Freq: Once | ORAL | Status: AC
  Administered 2023-06-25: 300 mg via ORAL
  Filled 2023-06-25: qty 4

## 2023-06-25 MED ORDER — ACETAMINOPHEN 325 MG PO TABS: 650.0000 mg | ORAL_TABLET | ORAL | Status: DC | PRN

## 2023-06-25 MED ORDER — SODIUM CHLORIDE 0.9% FLUSH
3.0000 mL | Freq: Once | INTRAVENOUS | Status: AC
  Administered 2023-06-25: 3 mL via INTRAVENOUS

## 2023-06-25 MED ORDER — ONDANSETRON HCL 4 MG/2ML IJ SOLN: 4.0000 mg | Freq: Three times a day (TID) | INTRAMUSCULAR | Status: DC | PRN

## 2023-06-25 MED ORDER — SENNOSIDES-DOCUSATE SODIUM 8.6-50 MG PO TABS: 1.0000 | ORAL_TABLET | Freq: Every evening | ORAL | Status: DC | PRN

## 2023-06-25 MED ORDER — DM-GUAIFENESIN ER 30-600 MG PO TB12: 1.0000 | ORAL_TABLET | Freq: Two times a day (BID) | ORAL | Status: DC | PRN

## 2023-06-25 MED ORDER — ALBUTEROL SULFATE (2.5 MG/3ML) 0.083% IN NEBU: 3.0000 mL | INHALATION_SOLUTION | RESPIRATORY_TRACT | Status: DC | PRN

## 2023-06-25 MED ORDER — HYDRALAZINE HCL 20 MG/ML IJ SOLN: 5.0000 mg | INTRAMUSCULAR | Status: DC | PRN

## 2023-06-25 MED ORDER — ACETAMINOPHEN 325 MG RE SUPP: 650.0000 mg | RECTAL | Status: DC | PRN

## 2023-06-25 NOTE — Progress Notes (Signed)
Anticoagulation monitoring(Lovenox):  78 yo  female ordered Lovenox 40 mg Q24h    Filed Weights   06/25/23 2201  Weight: 89.7 kg (197 lb 10.3 oz)   BMI 31.9    Lab Results  Component Value Date   CREATININE 1.20 (H) 06/25/2023   CREATININE 1.46 (H) 06/24/2022   CREATININE 1.29 (H) 03/15/2022   Estimated Creatinine Clearance: 45.7 mL/min (A) (by C-G formula based on SCr of 1.2 mg/dL (H)). Hemoglobin & Hematocrit     Component Value Date/Time   HGB 13.9 06/25/2023 1638   HGB 15.4 06/24/2022 0841   HCT 42.8 06/25/2023 1638   HCT 47.1 (H) 06/24/2022 6045     Per Protocol for Patient with estCrcl > 30 ml/min and BMI > 30, will transition to Lovenox 45 mg Q24h.

## 2023-06-25 NOTE — Progress Notes (Signed)
  Chaplain On-Call responded to Code Stroke notification at 1632 hours.  The patient was at the CT Scan area for tests.  Chaplain assured ED Staff of availability as needed.  Chaplain Morene Crocker., [facility]

## 2023-06-25 NOTE — H&P (Signed)
History and Physical    Audrey Martinez KGM:[id number] DOB: [birth year] DOA: 06/25/2023  Referring MD/NP/PA:   PCP: [name], MD   Patient coming from:  The patient is coming from home.     Chief Complaint: left facial numbness, left eye double vision and blurry vision  HPI: Audrey Martinez is a 78 y.o. female with medical history significant of PAF not on anticoagulants, septic PE (finished the Xarelto treatment), HLD, asthma, CAD, sCHF with EF 30-35%, CKD-3a, who presents with left facial numbness, left eye double vision and blurry vision.  Pt was LKN at about 3:30 PM today. Pt sates that she was watching TV when she noticed that her left lower facial numbness and left eye double vision, then with blurry vision in left eye.  She states that her right eye and right face are normal. She initially reported to [facility] neurologist that she had left leg numbness, but she strongly denies this symptom to me.  Her vision change has resolved, but still has mild numbness in left face when I saw pt in ED. Patient states that she does not have numbness and weakness in her arm or legs.  Patient does not have chest pain, cough, SOB.  No nausea, vomiting, diarrhea or abdominal pain.  No symptoms of UTI.  Data reviewed independently and ED Course: pt was found to have WBC 6.3, stable renal function, INR 0.9, PTT 26, blood pressure 128/83, heart rate 84, RR 17, oxygen saturation 100% on room air.  CT of head is negative for acute intracranial abnormalities.  CTA of head and neck negative for LVO.  MRI of brain is negative for acute stroke.  Patient is placed on telemetry bed for ablation.  Teleneurologist, Dr. Armanda Magic of neurology is consulted.  CT-head: 1. No acute intracranial abnormality or significant interval change. Aspects 10/10 2. Stable remote infarct of the anterior left frontal lobe. 3. Stable mild generalized atrophy and white matter disease. This likely reflects the sequela of chronic  microvascular ischemia. 4. Remote lacunar infarcts of the right cerebellar hemisphere.  MRI-brain: 1. No evidence of acute intracranial abnormality. 2. Left frontal cystic encephalomalacia and remote right cerebellar infarct.     EKG: I have personally reviewed.  Sinus rhythm, QTc 438, LAD, poor R wave progression, low voltage.   Review of Systems:   General: no fevers, chills, no body weight gain, fatigue HEENT: no blurry vision, hearing changes or sore throat Respiratory: no dyspnea, coughing, wheezing CV: no chest pain, no palpitations GI: no nausea, vomiting, abdominal pain, diarrhea, constipation GU: no dysuria, burning on urination, increased urinary frequency, hematuria  Ext: no leg edema Neuro: Has left eye double vision, blurry vision, has left facial numbness Skin: no rash, no skin tear. MSK: No muscle spasm, no deformity, no limitation of range of movement in spin Heme: No easy bruising.  Travel history: No recent long distant travel.   Allergy:  Allergies  Allergen Reactions   Amoxicillin Hives, Shortness Of Breath and Swelling    Did it involve swelling of the face/tongue/throat, SOB, or low BP? Yes Did it involve sudden or severe rash/hives, skin peeling, or any reaction on the inside of your mouth or nose? Yes Did you need to seek medical attention at a hospital or doctor's office? Yes When did it last happen? 1998      If all above answers are "NO", may proceed with cephalosporin use.    Aspirin Anaphylaxis and Shortness Of Breath   Bacitracin-Polymyx-Neo-Hc [Bacitra-Neomycin-Polymyxin-Hc]  Rash   Ciprofloxacin Rash   Ciprofloxacin-Dexamethasone Anaphylaxis   Crestor [Rosuvastatin] Other (See Comments)    Caused problems with memory.   Erythromycin Hives and Rash   Hydrochlorothiazide Other (See Comments) and Palpitations    Raises her blood pressure.    Ibuprofen Other (See Comments)    Sharp stomach pains   Morphine And Codeine Nausea And Vomiting    Morpholine Salicylate Nausea Only   Omeprazole Palpitations and Other (See Comments)    Tachycardia and same sensation as her SVTs per patient   Doxycycline Hyclate Hives   Keflex [Cephalexin] Hives   Lovastatin Other (See Comments)    "Unable to function"   Simvastatin Other (See Comments)    "Unable to function"   Neomycin-Bacitracin Zn-Polymyx Hives and Rash   Tape Hives and Rash    Past Medical History:  Diagnosis Date   Actinic keratosis    Acute pulmonary embolus (HCC) 01/29/2020   Acute renal failure (ARF) (HCC) 01/31/2020   Acute respiratory failure with hypoxia (HCC) 01/29/2020   Acute septic pulmonary embolism (HCC)    AKI (acute kidney injury) (HCC)    Amaurosis fugax, right eye 12/2018   Arthritis    Asthma    Basal cell carcinoma 07/2016   central forehead (tx in [place])   Cardiomyopathy [facility])    a. 04/2018 Echo: EF 45-50%. Anteroseptal and apical HK in some views. Mild AI/MR. Nl RV fxn; b. 05/2018 MV: mid-dist ant and antsept mild defect - ? breast atten. EF 41%. No ischemia; c. 03/2019 Echo: EF 30-35%, Gr2 DD, Trace MR, triv TR. Asc Ao ectatic dil - 38mm.    Coronary artery disease    Dyspnea    Dysrhythmia    Facial laceration 06/16/2022   Date of injury: 06/08/2022  [facility] ED history below:  78 year old female presenting to the emergency department after mechanical, nonsyncopal fall causing her to strike her face on the edge/corner of a curio cabinet and sustaining a deep and approximate 8 cm laceration to the right cheek in an irregular pattern to the angle of the right lower jaw. Wound does not proceed th   GERD (gastroesophageal reflux disease)    Herniated disc, lumbar    Hyperlipidemia    Left wrist fracture    Melanoma (HCC) 05/12/2020   Melanoma IS R post neck, excised 08/10/20   Osteoporosis    Pneumonia due to COVID-19 virus 01/26/2020   Pulmonary embolus (HCC) 01/31/2020   Complication of Covid-19 infection 12/2019:   Xarelto 20 mg daily x 6 months then PRN   Stroke (HCC) 1998   SVT (supraventricular tachycardia) (HCC)    a.2008 s/p RFCA for AVNRT; b. 9 & 02/2019 Zio x 2: 1. Avg HR 89, 3 runs NSVT (longest 7 beats), Mobitz 1. 2. Avg HR 89, NSVT x 1 (5 beats), Mobitz 1. No signif arrhythmia.   Tendinitis of right knee 07/03/2017    Past Surgical History:  Procedure Laterality Date   BACK SURGERY  2017   L2-4 laminectomy and foraminal stenosis   BREAST CYST ASPIRATION Left 1977   CARDIAC CATHETERIZATION  2009   [facility] in [facility]   CARDIAC ELECTROPHYSIOLOGY STUDY AND ABLATION  2009   CATARACT EXTRACTION, BILATERAL  2010   COLONOSCOPY WITH PROPOFOL N/A 03/21/2018   Procedure: COLONOSCOPY WITH Biopsy;  Surgeon: Toney Reil, MD;  Location: [facility] SUR[facility]ERY [facility];  Service: Endoscopy;  Laterality: N/A;   COLONOSCOPY WITH PROPOFOL N/A 05/03/2021   Procedure: COLONOSCOPY  WITH PROPOFOL;  Surgeon: Toney Reil, MD;  Location: [facility] ENDOSCOPY;  Service: Gastroenterology;  Laterality: N/A;   CORONARY ANGIOPLASTY     derrick procedure  1977-78   bilateral    ESOPHAGOGASTRODUODENOSCOPY  2013   gastritis; done for complaint of dysphagia   ESOPHAGOGASTRODUODENOSCOPY (EGD) WITH PROPOFOL N/A 03/21/2018   Procedure: ESOPHAGOGASTRODUODENOSCOPY (EGD) WITH Biopsies;  Surgeon: Toney Reil, MD;  Location: [facility] SURGERY [facility];  Service: Endoscopy;  Laterality: N/A;  KEEP THIS PATIENT FIRST   JOINT REPLACEMENT     LEFT HEART CATH AND CORONARY ANGIOGRAPHY Left 05/02/2019   Procedure: LEFT HEART CATH AND CORONARY ANGIOGRAPHY;  Surgeon: Antonieta Iba, MD;  Location: [facility] INVASIVE CV LAB;  Service: Cardiovascular;  Laterality: Left;   lymph node removal     neck   MOHS SURGERY  07/2016   BCCA nose   POLYPECTOMY N/A 03/21/2018   Procedure: POLYPECTOMY;  Surgeon: Toney Reil, MD;  Location: [facility] SURGERY [facility];  Service: Endoscopy;  Laterality: N/A;   REPLACEMENT TOTAL  KNEE  2013   RT   TOTAL SHOULDER REPLACEMENT  2012   RT     Social History:  reports that she has never smoked. She has never used smokeless tobacco. She reports that she does not currently use alcohol. She reports that she does not use drugs.  Family History:  Family History  Problem Relation Age of Onset   Diabetes Mother    Stroke Mother    Valvular heart disease Father    CAD Sister 55       died of MI   Stroke Sister    Stroke Sister 67   Clotting disorder Half-Sister    Clotting disorder Half-Sister    Breast cancer Neg Hx      Prior to Admission medications   Medication Sig Start Date End Date Taking? Authorizing Provider  B Complex-C (B-COMPLEX WITH VITAMIN C) tablet Take 1 tablet by mouth daily.     [provider]  baclofen (LIORESAL) 10 MG tablet TAKE 1 TABLET BY MOUTH THREE TIMES A DAY 12/15/22   [name], MD  Bioflavonoid Products (ESTER-C) TABS Take 1 tablet by mouth 2 (two) times daily.     [provider]  calcium carbonate (OS-CAL - DOSED IN MG OF ELEMENTAL CALCIUM) 1250 (500 Ca) MG tablet Take 1 tablet by mouth.    [provider]  carvedilol (COREG) 6.25 MG tablet Take 1 tablet (6.25 mg total) by mouth 2 (two) times daily with a meal. 01/10/23   Gollan, Tollie Pizza, MD  cholecalciferol (VITAMIN D) 25 MCG (1000 UT) tablet Take 1,000 Units by mouth daily.    [provider]  Cranberry 1000 MG CAPS Take 1,000 mg by mouth daily.     [provider]  ezetimibe (ZETIA) 10 MG tablet TAKE 1 TABLET BY MOUTH EVERY DAY 10/21/22   [name], MD  famotidine (PEPCID) 20 MG tablet Take 20 mg by mouth as needed. Patient not taking: Reported on 04/24/2023    [provider]  Fluticasone Furoate (ARNUITY ELLIPTA) 200 MCG/ACT AEPB Inhale 1 puff into the lungs daily. 06/30/22   Erin Fulling, MD  gabapentin (NEURONTIN) 300 MG capsule Take 1 capsule (300 mg total) by mouth 2 (two) times daily. Home med. 02/13/22   Darlin Priestly, MD  Ginger, Zingiber officinalis, (GINGER PO) Take 1,100 mg by mouth daily.     [provider]  HYDROcodone-acetaminophen (NORCO/VICODIN) 5-325 MG tablet Take 1 tablet  by mouth every 6 (six) hours as needed. 03/09/22   [provider]  JARDIANCE 10 MG TABS tablet TAKE 1 TABLET BY MOUTH EVERY DAY Patient not taking: Reported on 04/24/2023 11/04/22   Sondra Barges, PA-C  Misc Natural Products (TART CHERRY ADVANCED PO) Take 1 capsule by mouth daily.     [provider]  montelukast (SINGULAIR) 10 MG tablet Take 10 mg by mouth every evening. 10/25/18   [provider]  Multiple Vitamins-Minerals (MULTIVITAMIN WITH MINERALS) tablet Take 1 tablet by mouth daily. Centrum Silver    [provider]  Olodaterol HCl (STRIVERDI RESPIMAT) 2.5 MCG/ACT AERS Inhale into the lungs. Patient not taking: Reported on 04/24/2023    [provider]  Probiotic Product (PROBIOTIC DAILY PO) Take 1 capsule by mouth 2 (two) times a week.     [provider]  rivaroxaban (XARELTO) 20 MG TABS tablet Take 1 tablet (20 mg total) by mouth as needed (PRN for long car or plane ride). 02/19/21   Antonieta Iba, MD  sacubitril-valsartan (ENTRESTO) 24-26 MG TAKE 1 TABLET BY MOUTH TWICE A DAY 02/24/23   Antonieta Iba, MD  spironolactone (ALDACTONE) 25 MG tablet Take 0.5 tablets (12.5 mg total) by mouth daily. 01/10/23   Antonieta Iba, MD  Turmeric 500 MG CAPS Take 500 mg by mouth daily.     [provider]  zinc sulfate 220 (50 Zn) MG capsule Take 1 capsule (220 mg total) by mouth daily. 02/02/20   Drema Dallas, MD    Physical Exam: Vitals:   06/25/23 2030 06/25/23 2130 06/25/23 2201 06/25/23 2215  BP: 135/82 (!) 141/82    Pulse: 83 86    Resp: 19 (!) 22    Temp:    98.5 F (36.9 C)  TempSrc:    Oral  SpO2: 100% 98%    Weight:   89.7 kg    General: Not in acute distress HEENT:       Eyes: PERRL, EOMI, no jaundice       ENT: No discharge  from the ears and nose, no pharynx injection, no tonsillar enlargement.        Neck: No JVD, no bruit, no mass felt. Heme: No neck lymph node enlargement. Cardiac: S1/S2, RRR, No murmurs, No gallops or rubs. Respiratory: No rales, wheezing, rhonchi or rubs. GI: Soft, nondistended, nontender, no rebound pain, no organomegaly, BS present. GU: No hematuria Ext: No pitting leg edema bilaterally. 1+DP/PT pulse bilaterally. Musculoskeletal: No joint deformities, No joint redness or warmth, no limitation of ROM in spin. Skin: No rashes.  Neuro: Alert, oriented X3, cranial nerves II-XII grossly intact, moves all extremities normally. Muscle strength 5/5 in all extremities, sensation to light touch intact. Psych: Patient is not psychotic, no suicidal or hemocidal ideation.  Labs on Admission: I have personally reviewed following labs and imaging studies  CBC: Recent Labs  Lab 06/25/23 1638  WBC 6.3  NEUTROABS 3.5  HGB 13.9  HCT 42.8  MCV 96.6  PLT 177   Basic Metabolic Panel: Recent Labs  Lab 06/25/23 1638  NA 139  K 4.6  CL 104  CO2 26  GLUCOSE 106*  BUN 27*  CREATININE 1.20*  CALCIUM 9.1   GFR: Estimated Creatinine Clearance: 45.7 mL/min (A) (by C-G formula based on SCr of 1.2 mg/dL (H)). Liver Function Tests: Recent Labs  Lab 06/25/23 1638  AST 22  ALT 17  ALKPHOS 66  BILITOT 0.8  PROT 6.1*  ALBUMIN 3.7  No results for input(s): "LIPASE", "AMYLASE" in the last 168 hours. No results for input(s): "AMMONIA" in the last 168 hours. Coagulation Profile: Recent Labs  Lab 06/25/23 1638  INR 0.9   Cardiac Enzymes: No results for input(s): "CKTOTAL", "CKMB", "CKMBINDEX", "TROPONINI" in the last 168 hours. BNP (last 3 results) No results for input(s): "PROBNP" in the last 8760 hours. HbA1C: No results for input(s): "HGBA1C" in the last 72 hours. CBG: Recent Labs  Lab 06/25/23 1630  GLUCAP 106*   Lipid Profile: No results for input(s): "CHOL", "HDL",  "LDLCALC", "TRIG", "CHOLHDL", "LDLDIRECT" in the last 72 hours. Thyroid Function Tests: No results for input(s): "TSH", "T4TOTAL", "FREET4", "T3FREE", "THYROIDAB" in the last 72 hours. Anemia Panel: No results for input(s): "VITAMINB12", "FOLATE", "FERRITIN", "TIBC", "IRON", "RETICCTPCT" in the last 72 hours. Urine analysis:    Component Value Date/Time   BILIRUBINUR neg 11/25/2019 1115   PROTEINUR Negative 11/25/2019 1115   UROBILINOGEN 0.2 11/25/2019 1115   NITRITE neg 11/25/2019 1115   LEUKOCYTESUR Moderate (2+) (A) 11/25/2019 1115   Sepsis Labs: @LABRCNTIP (procalcitonin:4,lacticidven:4) )No results found for this or any previous visit (from the past 240 hours).   Radiological Exams on Admission:   Assessment/Plan Principal Problem:   TIA (transient ischemic attack) Active Problems:   HLD (hyperlipidemia)   Essential hypertension   HFrEF (heart failure with reduced ejection fraction) (HCC)   Intermittent asthma without complication   CAD (coronary artery disease)   Chronic kidney disease, stage 3a (HCC)   Obesity (BMI 30-39.9)   Assessment and Plan:  TIA (transient ischemic attack): Patient's symptoms is likely due to TIA.  MRI is negative for stroke. CTA negative for LVO. Her symptoms only involve left face and the left eye, another potential differential diagnosis Bell's palsy.  On my examination, patient does not have left facial droop, not very typical for Bell's palsy.  Patient denies recent viral infection symptoms. Consulted teleneurologist, Dr. Armanda Magic who recommended starting Plavix and aspirin, however patient has anaphylactic reaction to aspirin.  Will start Plavix alone.  -Placed on tele bed for observation - continue Zetia (patient is allergic to statin) -300 mg of Plavix is given in ED, will continue 75 mg daily - Patient has history of PAF, will need chronic anticoagulants after acute phase of TIA is over - will hold oral Bp meds to allow permissive HTN -  fasting lipid panel and HbA1c  - 2D transthoracic echocardiography  - swallowing screen. If fails, will get SLP  HLD (hyperlipidemia): -Zetia  Essential hypertension - prn IV hydralazine for SBP>220 or dBP>110  HFrEF (heart failure with reduced ejection fraction) (HCC): 2D echo on 01/09/2020 showed EF 30 to 35%.  Patient does not have shortness breath, no leg edema.  CHF is compensated. -Hold diuretics in the setting of TIA -Check BNP  Intermittent asthma without complication: Stable -Bronchodilators and as needed Mucinex  CAD (coronary artery disease) -Plavix and Zetia  Chronic kidney disease, stage 3a (HCC): Renal function stable.  Recent baseline creatinine 1.46 on 06/24/2022.  Her creatinine is 1.20, BUN 27, GFR 47. -Follow with BMP  Obesity (BMI 30-39.9): Body weight 89.7 kg, BMI 31.90 -Encourage losing weight -Exercise and healthy diet         DVT ppx: SQ Lovenox  Code Status: Full code   Family Communication: Yes, patient's son at bed side.    Disposition Plan:  Anticipate discharge back to previous environment  Consults called: Teleneurologist, Dr. Armanda Magic of neurology is consulted.  Admission status and Level of  care: Telemetry Medical:    for obs as inpt        Dispo: The patient is from: Home              Anticipated d/c is to: Home              Anticipated d/c date is: 1 day              Patient currently is not medically stable to d/c.    Severity of Illness:  The appropriate patient status for this patient is OBSERVATION. Observation status is judged to be reasonable and necessary in order to provide the required intensity of service to ensure the patient's safety. The patient's presenting symptoms, physical exam findings, and initial radiographic and laboratory data in the context of their medical condition is felt to place them at decreased risk for further clinical deterioration. Furthermore, it is anticipated that the patient will be medically  stable for discharge from the hospital within 2 midnights of admission.        Date of Service 06/25/2023    Lorretta Harp [facility]   If 7PM-7AM, please contact night-coverage [email or web address] 06/25/2023, 10:17 PM

## 2023-06-25 NOTE — ED Provider Triage Note (Signed)
Emergency Medicine Provider Triage Evaluation Note  Audrey Martinez , a 78 y.o. female  was evaluated in triage.  Pt complains of double vision, left side facial tingling/numbness that started about 3:30 today. Double vision has resolved, left eye is blurry and left side of face still tingles.  Physical Exam  There were no vitals taken for this visit. Gen:   Awake, no distress   Resp:  Normal effort  MSK:   Moves extremities without difficulty  Other:    Medical Decision Making  Medically screening exam initiated at 4:32 PM.  Appropriate orders placed.  Audrey Martinez was informed that the remainder of the evaluation will be completed by another provider, this initial triage assessment does not replace that evaluation, and the importance of remaining in the ED until their evaluation is complete.  Code Stroke called.   Chinita Pester, FNP 06/25/23 [phone]

## 2023-06-25 NOTE — ED Notes (Signed)
In CT with pt. LKW 1530, L facial numbness and blurred vision to L eye.

## 2023-06-25 NOTE — Consult Note (Signed)
[facility] Telemedicine Consult   Requesting Provider:  Consult Participants: Dr. Marthe Patch, Telespecialist RNMisty   Bedside RN Location of the provider: [facility] Location of the patient: [facility]  This consult was provided via telemedicine with 2-way video and audio communication. The patient/family was informed that care would be provided in this way and agreed to receive care in this manner.   Chief Complaint: left facial numbness  HPI:  Patient is a 78 yof with PMHx significant for HTN, HFrEF (EF 30-35% in 2021), left frontal stroke, and ?A-fib who presents to the ED with left facial numbness. Patient was watching TV when she noticed that her left lower face felt numb; a few minutes later she noticed the numbness in the left leg as well. Her LKN was 3:30PM. Her BP in the ED was 120/74, BG 102. She denies any recent AP or AC use.    Past Medical History:  Diagnosis Date   Actinic keratosis    Acute pulmonary embolus (HCC) 01/29/2020   Acute renal failure (ARF) (HCC) 01/31/2020   Acute respiratory failure with hypoxia (HCC) 01/29/2020   Acute septic pulmonary embolism (HCC)    AKI (acute kidney injury) (HCC)    Amaurosis fugax, right eye 12/2018   Arthritis    Asthma    Basal cell carcinoma 07/2016   central forehead (tx in [place])   Cardiomyopathy [facility])    a. 04/2018 Echo: EF 45-50%. Anteroseptal and apical HK in some views. Mild AI/MR. Nl RV fxn; b. 05/2018 MV: mid-dist ant and antsept mild defect - ? breast atten. EF 41%. No ischemia; c. 03/2019 Echo: EF 30-35%, Gr2 DD, Trace MR, triv TR. Asc Ao ectatic dil - 38mm.    Coronary artery disease    Dyspnea    Dysrhythmia    Facial laceration 06/16/2022   Date of injury: 06/08/2022  [facility] ED history below:  78 year old female presenting to the emergency department after mechanical, nonsyncopal fall causing her to strike her face on the edge/corner of a curio cabinet and sustaining a deep and  approximate 8 cm laceration to the right cheek in an irregular pattern to the angle of the right lower jaw. Wound does not proceed th   GERD (gastroesophageal reflux disease)    Herniated disc, lumbar    Hyperlipidemia    Left wrist fracture    Melanoma (HCC) 05/12/2020   Melanoma IS R post neck, excised 08/10/20   Osteoporosis    Pneumonia due to COVID-19 virus 01/26/2020   Pulmonary embolus (HCC) 01/31/2020   Complication of Covid-19 infection 12/2019:  Xarelto 20 mg daily x 6 months then PRN   Stroke (HCC) 1998   SVT (supraventricular tachycardia) (HCC)    a.2008 s/p RFCA for AVNRT; b. 9 & 02/2019 Zio x 2: 1. Avg HR 89, 3 runs NSVT (longest 7 beats), Mobitz 1. 2. Avg HR 89, NSVT x 1 (5 beats), Mobitz 1. No signif arrhythmia.   Tendinitis of right knee 07/03/2017     Current Facility-Administered Medications:    sodium chloride flush (NS) 0.9 % injection 3 mL, 3 mL, Intravenous, Once, Shaune Pollack, MD  Current Outpatient Medications:    B Complex-C (B-COMPLEX WITH VITAMIN C) tablet, Take 1 tablet by mouth daily. , Disp: , Rfl:    baclofen (LIORESAL) 10 MG tablet, TAKE 1 TABLET BY MOUTH THREE TIMES A DAY, Disp: 270 tablet, Rfl: 2   Bioflavonoid Products (ESTER-C) TABS, Take 1 tablet by mouth 2 (two) times daily. ,  Disp: , Rfl:    calcium carbonate (OS-CAL - DOSED IN MG OF ELEMENTAL CALCIUM) 1250 (500 Ca) MG tablet, Take 1 tablet by mouth., Disp: , Rfl:    carvedilol (COREG) 6.25 MG tablet, Take 1 tablet (6.25 mg total) by mouth 2 (two) times daily with a meal., Disp: 180 tablet, Rfl: 3   cholecalciferol (VITAMIN D) 25 MCG (1000 UT) tablet, Take 1,000 Units by mouth daily., Disp: , Rfl:    Cranberry 1000 MG CAPS, Take 1,000 mg by mouth daily. , Disp: , Rfl:    ezetimibe (ZETIA) 10 MG tablet, TAKE 1 TABLET BY MOUTH EVERY DAY, Disp: 90 tablet, Rfl: 2   famotidine (PEPCID) 20 MG tablet, Take 20 mg by mouth as needed. (Patient not taking: Reported on 04/24/2023), Disp: , Rfl:     Fluticasone Furoate (ARNUITY ELLIPTA) 200 MCG/ACT AEPB, Inhale 1 puff into the lungs daily., Disp: 30 each, Rfl: 2   gabapentin (NEURONTIN) 300 MG capsule, Take 1 capsule (300 mg total) by mouth 2 (two) times daily. Home med., Disp: 540 capsule, Rfl: 3   Ginger, Zingiber officinalis, (GINGER PO), Take 1,100 mg by mouth daily. , Disp: , Rfl:    HYDROcodone-acetaminophen (NORCO/VICODIN) 5-325 MG tablet, Take 1 tablet by mouth every 6 (six) hours as needed., Disp: , Rfl:    JARDIANCE 10 MG TABS tablet, TAKE 1 TABLET BY MOUTH EVERY DAY (Patient not taking: Reported on 04/24/2023), Disp: 90 tablet, Rfl: 0   Misc Natural Products (TART CHERRY ADVANCED PO), Take 1 capsule by mouth daily. , Disp: , Rfl:    montelukast (SINGULAIR) 10 MG tablet, Take 10 mg by mouth every evening., Disp: , Rfl:    Multiple Vitamins-Minerals (MULTIVITAMIN WITH MINERALS) tablet, Take 1 tablet by mouth daily. Centrum Silver, Disp: , Rfl:    Olodaterol HCl (STRIVERDI RESPIMAT) 2.5 MCG/ACT AERS, Inhale into the lungs. (Patient not taking: Reported on 04/24/2023), Disp: , Rfl:    Probiotic Product (PROBIOTIC DAILY PO), Take 1 capsule by mouth 2 (two) times a week. , Disp: , Rfl:    rivaroxaban (XARELTO) 20 MG TABS tablet, Take 1 tablet (20 mg total) by mouth as needed (PRN for long car or plane ride)., Disp: 30 tablet, Rfl: 3   sacubitril-valsartan (ENTRESTO) 24-26 MG, TAKE 1 TABLET BY MOUTH TWICE A DAY, Disp: 180 tablet, Rfl: 3   spironolactone (ALDACTONE) 25 MG tablet, Take 0.5 tablets (12.5 mg total) by mouth daily., Disp: 45 tablet, Rfl: 3   Turmeric 500 MG CAPS, Take 500 mg by mouth daily. , Disp: , Rfl:    zinc sulfate 220 (50 Zn) MG capsule, Take 1 capsule (220 mg total) by mouth daily., Disp: 30 capsule, Rfl: 0    LKW: 3:30PM 06/25/2023 IV thrombolysis given?: No, patient declined.  IR Thrombectomy? No, low NIHSS, pending CTA H/N Modified Rankin Scale: 0-Completely asymptomatic and back to baseline post- stroke Time of  teleneurologist evaluation: 16:36PM  Exam: Vitals:   06/25/23 1631  BP: 120/74  Pulse: 82  Resp: 18  SpO2: 98%    General: Well appearing patient, in no acute distress   NIHSS 1A: Level of Consciousness - 0 1B: Ask Month and Age - 0 1C: 'Blink Eyes' & 'Squeeze Hands' - 0 2: Test Horizontal Extraocular Movements - 0 3: Test Visual Fields - 0 4: Test Facial Palsy - 0 5A: Test Left Arm Motor Drift -  5B: Test Right Arm Motor Drift - 0 6A: Test Left Leg Motor Drift - 0 6B: Test  Right Leg Motor Drift - 0 7: Test Limb Ataxia - 0 8: Test Sensation - 1 (mild numbness in the left lower face, and left leg) 9: Test Language/Aphasia- 0 10: Test Dysarthria - 0 11: Test Extinction/Inattention - 0 NIHSS score: 1   Imaging Reviewed: reviewed CTH, evidence of old left frontal stroke, no acute findings.   Labs reviewed in epic and pertinent values follow: CBC    Component Value Date/Time   WBC 6.3 06/25/2023 1638   RBC 4.43 06/25/2023 1638   HGB 13.9 06/25/2023 1638   HGB 15.4 06/24/2022 0841   HCT 42.8 06/25/2023 1638   HCT 47.1 (H) 06/24/2022 0841   PLT 177 06/25/2023 1638   PLT 232 06/24/2022 0841   MCV 96.6 06/25/2023 1638   MCV 95 06/24/2022 0841   MCH 31.4 06/25/2023 1638   MCHC 32.5 06/25/2023 1638   RDW 12.6 06/25/2023 1638   RDW 12.1 06/24/2022 0841   LYMPHSABS 1.9 06/25/2023 1638   LYMPHSABS 2.0 06/24/2022 0841   MONOABS 0.7 06/25/2023 1638   EOSABS 0.2 06/25/2023 1638   EOSABS 0.2 06/24/2022 0841   BASOSABS 0.0 06/25/2023 1638   BASOSABS 0.0 06/24/2022 0841   CMP     Component Value Date/Time   NA 139 06/25/2023 1638   NA 142 06/24/2022 0841   K 4.6 06/25/2023 1638   CL 104 06/25/2023 1638   CO2 26 06/25/2023 1638   GLUCOSE 106 (H) 06/25/2023 1638   BUN 27 (H) 06/25/2023 1638   BUN 20 06/24/2022 0841   CREATININE 1.20 (H) 06/25/2023 1638   CALCIUM 9.1 06/25/2023 1638   PROT 6.1 (L) 06/25/2023 1638   PROT 6.1 06/24/2022 0841   ALBUMIN 3.7 06/25/2023  1638   ALBUMIN 4.4 06/24/2022 0841   AST 22 06/25/2023 1638   ALT 17 06/25/2023 1638   ALKPHOS 66 06/25/2023 1638   BILITOT 0.8 06/25/2023 1638   BILITOT 0.6 06/24/2022 0841   GFRNONAA 47 (L) 06/25/2023 1638   GFRAA >60 02/01/2020 0557     Assessment:  Patient presents with left lower facial and LLE numbness. Patients with a thalamic stroke can present with pure sensory symptoms though one would expect involvement of the arm as well  will get MRI brain wo to rule out an acute stroke. She is not on any AP or AC at home.   Recommendations:  - load with ASA 325mg  and Plavix 300mg  STAT, then continue ASA 81mg  daily and Plavix 75mg  daily x21 days followed by monotherapy with ASA 81mg  daily (per CHANCE trial). - MRI brain wo - Telemetry; patient may need extending cardiac monitoring on discharge given her / Hx of A-fib (she tells me she was diagnosed with it years ago though she was never put on [facility]; oprevious notes state that it was never captured). - If evidence of acute/subacute stroke on MRI, then perform 2D echo withOUT bubble, LDL, A1c for risk stratification    Risks benefits and alternatives of IV TNKase discussed: declined   This patient is receiving care for possible acute neurological changes. There was 45 minutes of care by this provider at the time of service, including time for direct evaluation via telemedicine, review of medical records, imaging studies and discussion of findings with providers, the patient and/or family.  -- Anibal Henderson, MD Neurologist [facility]

## 2023-06-25 NOTE — ED Notes (Signed)
Code stroke was called to [facility] at 4:32 pm and spoke to [place].

## 2023-06-25 NOTE — Consult Note (Signed)
9147 TSRN called by Dr Wilford Corner to initiate code  stroke. Pt having L sided tingling to face, arm and leg onset 1530 today with some blurry vision.  Blurry vision has since resolved.  1636 on [place] stroke cart 1, Dr Armanda Magic on screen.  Cart then taken to CT scanner Modified Rankin Score:0 CT reports called to Dr Armanda Magic by radiologist.  Patient refused TNK.

## 2023-06-25 NOTE — ED Notes (Signed)
CBG and Istat per previous shift

## 2023-06-25 NOTE — ED Provider Notes (Signed)
[facility] Provider Note    Event Date/Time   First MD Initiated Contact with Patient 06/25/23 1714     (approximate)   History   Numbness and Blurred Vision   HPI  Audrey Martinez is a 78 y.o. female  here with multiple complaints. Pt reports that just prior to arrival she had acute onset of double vision, followed by left facial numbness. Double vision has improved but numbness has persisted. LKN around 3:30 today. No weakness, numbness. Reports h/o same and says she may have had afib in past but does not currently take anticoagulants. No headache. No fever, chills. No recent falls.       Physical Exam   Triage Vital Signs: ED Triage Vitals [06/25/23 1631]  Encounter Vitals Group     BP 120/74     Systolic BP Percentile      Diastolic BP Percentile      Pulse Rate 82     Resp 18     Temp      Temp src      SpO2 98 %     Weight      Height      Head Circumference      Peak Flow      Pain Score      Pain Loc      Pain Education      Exclude from Growth Chart     Most recent vital signs: Vitals:   06/26/23 0030 06/26/23 0100  BP: 119/62 133/84  Pulse: 64 (!) 106  Resp: 17 15  Temp:    SpO2: 95% 98%     General: Awake, no distress.  CV:  Good peripheral perfusion. RRR. Resp:  Normal work of breathing. Lungs clear. Abd:  No distention. No tenderness. Other:  Subjective diminished sensation left lower face. EOMI. No nystagmus. Face is symmetric. Tongue protrusion midline. Strength 5/5 bl UE and LE. Normal sensation to light touch.   ED Results / Procedures / Treatments   Labs (all labs ordered are listed, but only abnormal results are displayed) Labs Reviewed  COMPREHENSIVE METABOLIC PANEL - Abnormal; Notable for the following components:      Result Value   Glucose, Bld 106 (*)    BUN 27 (*)    Creatinine, Ser 1.20 (*)    Total Protein 6.1 (*)    GFR, Estimated 47 (*)    All other components within normal limits   CBG MONITORING, ED - Abnormal; Notable for the following components:   Glucose-Capillary 106 (*)    All other components within normal limits  PROTIME-INR  APTT  CBC  DIFFERENTIAL  ETHANOL  LIPID PANEL  HEMOGLOBIN A1C  BASIC METABOLIC PANEL  I-STAT CREATININE, ED     EKG    RADIOLOGY CT Angio Head/neck: No LVO, mild atherosclerotic changes CT Head; NAICA MR Brain: Negative   I also independently reviewed and agree with radiologist interpretations.   PROCEDURES:  Critical Care performed: No   MEDICATIONS ORDERED IN ED: Medications  albuterol (PROVENTIL) (2.5 MG/3ML) 0.083% nebulizer solution 3 mL (has no administration in time range)  dextromethorphan-guaiFENesin (MUCINEX DM) 30-600 MG per 12 hr tablet 1 tablet (has no administration in time range)  ondansetron (ZOFRAN) injection 4 mg (has no administration in time range)  hydrALAZINE (APRESOLINE) injection 5 mg (has no administration in time range)   stroke: early stages of recovery book (has no administration in time range)  acetaminophen (TYLENOL) tablet 650 mg (has  no administration in time range)    Or  acetaminophen (TYLENOL) 160 MG/5ML solution 650 mg (has no administration in time range)    Or  acetaminophen (TYLENOL) suppository 650 mg (has no administration in time range)  senna-docusate (Senokot-S) tablet 1 tablet (has no administration in time range)  enoxaparin (LOVENOX) injection 45 mg (45 mg Subcutaneous Given 06/25/23 2311)  HYDROcodone-acetaminophen (NORCO/VICODIN) 5-325 MG per tablet 1 tablet (has no administration in time range)  ezetimibe (ZETIA) tablet 10 mg (has no administration in time range)  clopidogrel (PLAVIX) tablet 75 mg (has no administration in time range)  sodium chloride flush (NS) 0.9 % injection 3 mL (3 mLs Intravenous Given 06/25/23 1810)  iohexol (OMNIPAQUE) 350 MG/ML injection 75 mL (75 mLs Intravenous Contrast Given 06/25/23 1705)  clopidogrel (PLAVIX) tablet 300 mg (300 mg  Oral Given 06/25/23 1809)     IMPRESSION / MDM / ASSESSMENT AND PLAN / ED COURSE  I reviewed the triage vital signs and the nursing notes.                              Differential diagnosis includes, but is not limited to, CVA, TIA, epilepsy, mass/lesion, encephalopathy  Patient's presentation is most consistent with acute presentation with potential threat to life or bodily function.  The patient is on the cardiac monitor to evaluate for evidence of arrhythmia and/or significant heart rate changes  78 yo F with PMHx  HTN, HFrEF, left frontal stroke, AFib here with L facial numbness. Activated as a CODE STROKE. Initial CT negative. Neuro evaluated recommended admission, ASA, plavix. She is allergic to aspirin however. Screening labs are unremarkable. CMP unremarkable. CBC without major leukocytosis or anemia. MR pending. Admit to medicine.  Pt allergic to ASA so Plavix given. Will admit for TIA.   FINAL CLINICAL IMPRESSION(S) / ED DIAGNOSES   Final diagnoses:  Stroke-like symptoms     Rx / DC Orders   ED Discharge Orders     None        Note:  This document was prepared using Dragon voice recognition software and may include unintentional dictation errors.   Shaune Pollack, MD 06/26/23 [phone]

## 2023-06-26 ENCOUNTER — Ambulatory Visit: Payer: 59 | Attending: Nurse Practitioner

## 2023-06-26 ENCOUNTER — Other Ambulatory Visit: Payer: Self-pay | Admitting: Nurse Practitioner

## 2023-06-26 ENCOUNTER — Observation Stay (HOSPITAL_BASED_OUTPATIENT_CLINIC_OR_DEPARTMENT_OTHER)
Admit: 2023-06-26 | Discharge: 2023-06-26 | Disposition: A | Discharge status: Home / Self Care | Payer: Medicare Other | Attending: Internal Medicine | Admitting: Internal Medicine

## 2023-06-26 DIAGNOSIS — G459 Transient cerebral ischemic attack, unspecified: Secondary | ICD-10-CM

## 2023-06-26 LAB — BASIC METABOLIC PANEL
Anion gap: 8 (ref 5–15)
BUN: 23 mg/dL (ref 8–23)
CO2: 25 mmol/L (ref 22–32)
Calcium: 8.7 mg/dL — ABNORMAL LOW (ref 8.9–10.3)
Chloride: 108 mmol/L (ref 98–111)
Creatinine, Ser: 1.05 mg/dL — ABNORMAL HIGH (ref 0.44–1.00)
GFR, Estimated: 55 mL/min — ABNORMAL LOW (ref >60)
Glucose, Bld: 95 mg/dL (ref 70–99)
Potassium: 4 mmol/L (ref 3.5–5.1)
Sodium: 141 mmol/L (ref 135–145)

## 2023-06-26 LAB — LIPID PANEL
Cholesterol: 175 mg/dL (ref 0–200)
HDL: 59 mg/dL (ref >40)
LDL Cholesterol: 96 mg/dL (ref 0–99)
Total CHOL/HDL Ratio: 3 {ratio}
Triglycerides: 99 mg/dL (ref <150)
VLDL: 20 mg/dL (ref 0–40)

## 2023-06-26 LAB — ECHOCARDIOGRAM COMPLETE
AR max vel: 3.08 cm2
AV Area VTI: 2.96 cm2
AV Area mean vel: 2.97 cm2
AV Mean grad: 3 mm[Hg]
AV Peak grad: 4.9 mm[Hg]
Ao pk vel: 1.11 m/s
Area-P 1/2: 5.58 cm2
P 1/2 time: 550 ms
S' Lateral: 2.6 cm
Weight: 3162.28 [oz_av]

## 2023-06-26 LAB — HEMOGLOBIN A1C
Hgb A1c MFr Bld: 5.1 % (ref 4.8–5.6)
Mean Plasma Glucose: 99.67 mg/dL

## 2023-06-26 MED ORDER — ATORVASTATIN CALCIUM 10 MG PO TABS: 40.0000 mg | ORAL_TABLET | Freq: Every day | ORAL | 0 refills | Status: DC

## 2023-06-26 MED ORDER — CLOPIDOGREL BISULFATE 75 MG PO TABS: 75.0000 mg | ORAL_TABLET | Freq: Every day | ORAL | 0 refills | Status: DC

## 2023-06-26 NOTE — Evaluation (Signed)
Physical Therapy Evaluation Patient Details Name: Audrey Martinez MRN: [id number] DOB: 1945/08/12 Today's Date: 06/26/2023  History of Present Illness  78 y.o. female with medical history significant of PAF not on anticoagulants, septic PE (finished the Xarelto treatment), HLD, asthma, CAD, sCHF with EF 30-35%, CKD-3a, who presents with left facial numbness, left eye double vision and blurry vision. MRI negative for acute infarct.   Clinical Impression  Pt A&Ox3, seated EOB with OT upon PT arrival. The pt did demonstrate decreased RLE strength but difficulty clarifying if this was due to R hip pain/ R TKA history. Pt did complain of decreased sensation of ball of her foot up to mid calf as well. She was able to stand and ambulate with RW with supervision, CGA for balance activities and her DGI score was 19 indicating increased falls risk.  Overall the patient demonstrated deficits (see "PT Problem List") that impede the patient's functional abilities, safety, and mobility and would benefit from skilled PT intervention.         If plan is discharge home, recommend the following: Assist for transportation   Can travel by private vehicle    yes    Equipment Recommendations None recommended by PT  Recommendations for Other Services    NA   Functional Status Assessment Patient has had a recent decline in their functional status and demonstrates the ability to make significant improvements in function in a reasonable and predictable amount of time.     Precautions / Restrictions Precautions Precautions: Fall Restrictions Weight Bearing Restrictions Per Provider Order: No      Mobility  Bed Mobility Overal bed mobility: Modified Independent                  Transfers Overall transfer level: Needs assistance Equipment used: Rolling walker (2 wheels) Transfers: Sit to/from Stand Sit to Stand: Supervision                Ambulation/Gait Ambulation/Gait assistance:  Supervision, Contact guard assist Gait Distance (Feet): 100 Feet Assistive device: Rolling walker (2 wheels)            Stairs            Wheelchair Mobility     Tilt Bed    Modified Rankin (Stroke Patients Only)       Balance Overall balance assessment: Needs assistance   Sitting balance-Leahy Scale: Good       Standing balance-Leahy Scale: Good                   Standardized Balance Assessment Standardized Balance Assessment : Dynamic Gait Index   Dynamic Gait Index Level Surface: Normal Change in Gait Speed: Normal Gait with Horizontal Head Turns: Mild Impairment Gait with Vertical Head Turns: Normal Gait and Pivot Turn: Mild Impairment Step Over Obstacle: Mild Impairment Step Around Obstacles: Mild Impairment Steps: Mild Impairment (clinical judgement) Total Score: 19       Pertinent Vitals/Pain Pain Assessment Pain Assessment: No/denies pain    Home Living Family/patient expects to be discharged to:: Private residence Living Arrangements: Alone Available Help at Discharge: Family;Available PRN/intermittently (pt reports son checks in on her daily) Type of Home: House Home Access: Stairs to enter Entrance Stairs-Rails: None Entrance Stairs-Number of Steps: threshold step   Home Layout: One level Home Equipment: Agricultural consultant (2 wheels);Rollator (4 wheels);Cane - single point;BSC/3in1;Hand held shower head Additional Comments: 3 sons, 1 nearby    Prior Function Prior Level of Function : Independent/Modified Independent;Driving;History of Falls (  last six months);Patient poor historian/Family not available             Mobility Comments: Pt reports using 2WW both in and outside the home. ADLs Comments: Pt reports IND with ADL and IADL, driving, med mgt, and groceries. Pt initially denies falls in past 69mo but then with cues, recalls 1 fall in December     Extremity/Trunk Assessment   Upper Extremity Assessment Upper Extremity  Assessment: Defer to OT evaluation    Lower Extremity Assessment Lower Extremity Assessment: LLE deficits/detail;RLE deficits/detail RLE Deficits / Details: grossly 4/5, weaker compared to LLE. pt does have chronic R hip pain, TKA. but endorsed decreased light touch of ball of foot/toes and halfway up her calf RLE Sensation: decreased light touch RLE Coordination: WNL LLE Deficits / Details: WFLs LLE Sensation: WNL LLE Coordination: WNL       Communication   Communication Communication: No apparent difficulties  Cognition Arousal: Alert Behavior During Therapy: WFL for tasks assessed/performed Overall Cognitive Status: No family/caregiver present to determine baseline cognitive functioning                                 General Comments: Pt pleasant, oriented x3        General Comments      Exercises     Assessment/Plan    PT Assessment Patient needs continued PT services  PT Problem List Decreased strength;Decreased balance;Decreased mobility;Decreased activity tolerance;Pain       PT Treatment Interventions DME instruction;Balance training;Gait training;Neuromuscular re-education;Stair training;Functional mobility training;Patient/family education;Therapeutic activities;Therapeutic exercise    PT Goals (Current goals can be found in the Care Plan section)  Acute Rehab PT Goals Patient Stated Goal: to go home PT Goal Formulation: With patient Time For Goal Achievement: 07/10/23 Potential to Achieve Goals: Good Additional Goals Additional Goal #1: The patient will score at least 20 points or higher on DGI to indicate decreased falls risk.    Frequency Min 1X/week     Co-evaluation               AM-PAC PT "6 Clicks" Mobility  Outcome Measure Help needed turning from your back to your side while in a flat bed without using bedrails?: None Help needed moving from lying on your back to sitting on the side of a flat bed without using  bedrails?: None Help needed moving to and from a bed to a chair (including a wheelchair)?: None Help needed standing up from a chair using your arms (e.g., wheelchair or bedside chair)?: None Help needed to walk in hospital room?: None Help needed climbing 3-5 steps with a railing? : A Little 6 Click Score: 23    End of Session Equipment Utilized During Treatment: Gait belt Activity Tolerance: Patient tolerated treatment well Patient left: in bed;with call bell/phone within reach;with bed alarm set Nurse Communication: Mobility status PT Visit Diagnosis: Other abnormalities of gait and mobility (R26.89);Difficulty in walking, not elsewhere classified (R26.2);Muscle weakness (generalized) (M62.81);Pain Pain - Right/Left: Right Pain - part of body: Hip    Time: 1610-9604 PT Time Calculation (min) (ACUTE ONLY): 16 min   Charges:   PT Evaluation $PT Eval Low Complexity: 1 Low PT Treatments $Therapeutic Activity: 8-22 mins PT General Charges $$ ACUTE PT VISIT: 1 Visit         Olga Coaster PT, DPT 10:51 AM,06/26/23

## 2023-06-26 NOTE — Evaluation (Signed)
Occupational Therapy Evaluation Patient Details Name: Audrey Martinez MRN: [id number] DOB: 24-Nov-1945 Today's Date: 06/26/2023   History of Present Illness 78 y.o. female with medical history significant of PAF not on anticoagulants, septic PE (finished the Xarelto treatment), HLD, asthma, CAD, sCHF with EF 30-35%, CKD-3a, who presents with left facial numbness, left eye double vision and blurry vision. MRI negative for acute infarct.   Clinical Impression   Pt was seen for OT evaluation this date. Prior to hospital admission, pt reports using 2WW for all mobility and IND with ADL and IADL including med mgt and short distance driving. Pt lives alone with her 2 dogs and reports she has a son who works close by that checks on her daily. Pt presents to acute OT demonstrating impaired ADL performance and functional mobility 2/2 increased cognitive processing time, impaired strength, and balance (See OT problem list for additional functional deficits). Pt currently requires supv for higher level ADL/IADL.  Pt would benefit from skilled OT services to address noted impairments and functional limitations (see below for any additional details) in order to maximize safety and independence while minimizing falls risk and caregiver burden.     If plan is discharge home, recommend the following: A little help with bathing/dressing/bathroom;Assistance with cooking/housework;Assist for transportation;Direct supervision/assist for medications management;Help with stairs or ramp for entrance    Functional Status Assessment  Patient has had a recent decline in their functional status and demonstrates the ability to make significant improvements in function in a reasonable and predictable amount of time.  Equipment Recommendations  None recommended by OT    Recommendations for Other Services       Precautions / Restrictions Precautions Precautions: Fall Restrictions Weight Bearing Restrictions Per  Provider Order: No      Mobility Bed Mobility Overal bed mobility: Modified Independent             General bed mobility comments: increased effort    Transfers Overall transfer level: Needs assistance Equipment used: Rolling walker (2 wheels) Transfers: Sit to/from Stand Sit to Stand: Supervision                  Balance Overall balance assessment: Mild deficits observed, not formally tested                                         ADL either performed or assessed with clinical judgement   ADL Overall ADL's : Needs assistance/impaired                                       General ADL Comments: Pt able to don shoes seated EOB without assist required, supv for ADL transfers with RW - grossly supervision for ADL involving STS and higher level balance     Vision         Perception         Praxis         Pertinent Vitals/Pain Pain Assessment Pain Assessment: No/denies pain     Extremity/Trunk Assessment Upper Extremity Assessment Upper Extremity Assessment: Defer to OT evaluation   Lower Extremity Assessment Lower Extremity Assessment: LLE deficits/detail;RLE deficits/detail RLE Deficits / Details: grossly 4/5, weaker compared to LLE. pt does have chronic R hip pain, TKA. but endorsed decreased light touch of ball of foot/toes and halfway  up her calf RLE Sensation: decreased light touch RLE Coordination: WNL LLE Deficits / Details: WFLs LLE Sensation: WNL LLE Coordination: WNL       Communication Communication Communication: No apparent difficulties   Cognition Arousal: Alert Behavior During Therapy: WFL for tasks assessed/performed Overall Cognitive Status: No family/caregiver present to determine baseline cognitive functioning                                 General Comments: Pt pleasant, oriented x3 ("am I here for my heart?), follows commands, increased processing time especially with dual  tasking     General Comments       Exercises     Shoulder Instructions      Home Living Family/patient expects to be discharged to:: Private residence Living Arrangements: Alone Available Help at Discharge: Family;Available PRN/intermittently (pt reports son checks in on her daily) Type of Home: House Home Access: Stairs to enter [employer] of Steps: threshold step Entrance Stairs-Rails: None Home Layout: One level     Bathroom Shower/Tub: Walk-in shower;Tub/shower unit (stall shower)   Bathroom Toilet: Standard     Home Equipment: Agricultural consultant (2 wheels);Rollator (4 wheels);Cane - single point;BSC/3in1;Hand held shower head   Additional Comments: 3 sons, 1 nearby      Prior Functioning/Environment Prior Level of Function : Independent/Modified Independent;Driving;History of Falls (last six months);Patient poor historian/Family not available             Mobility Comments: Pt reports using 2WW both in and outside the home. ADLs Comments: Pt reports IND with ADL and IADL, driving, med mgt, and groceries. Pt initially denies falls in past 43mo but then with cues, recalls 1 fall in December        OT Problem List: Decreased strength;Decreased cognition;Impaired balance (sitting and/or standing);Decreased knowledge of use of DME or AE      OT Treatment/Interventions: Self-care/ADL training;Therapeutic exercise;Therapeutic activities;Cognitive remediation/compensation;DME and/or AE instruction;Patient/family education;Balance training    OT Goals(Current goals can be found in the care plan section) Acute Rehab OT Goals Patient Stated Goal: go home OT Goal Formulation: With patient Time For Goal Achievement: 07/10/23 Potential to Achieve Goals: Good ADL Goals Additional ADL Goal #1: Pt will complete shower primarily from sitting wiht modified independence, 1/1 opportunity. Additional ADL Goal #2: Pt will complete morning ADL routine with modified  independence, LRAD, and no VC required for safety.  OT Frequency: Min 1X/week    Co-evaluation              AM-PAC OT "6 Clicks" Daily Activity     Outcome Measure Help from another person eating meals?: None Help from another person taking care of personal grooming?: None Help from another person toileting, which includes using toliet, bedpan, or urinal?: None Help from another person bathing (including washing, rinsing, drying)?: None Help from another person to put on and taking off regular upper body clothing?: None Help from another person to put on and taking off regular lower body clothing?: None 6 Click Score: 24   End of Session Equipment Utilized During Treatment: Rolling walker (2 wheels)  Activity Tolerance: Patient tolerated treatment well Patient left: in bed;with call bell/phone within reach;Other (comment);with nursing/sitter in room (seated EOB with PT and RN)  OT Visit Diagnosis: Other abnormalities of gait and mobility (R26.89);History of falling (Z91.81);Muscle weakness (generalized) (M62.81)                Time:  1610-9604 OT Time Calculation (min): 24 min Charges:  OT General Charges $OT Visit: 1 Visit OT Evaluation $OT Eval Low Complexity: 1 Low OT Treatments $Therapeutic Activity: 8-22 mins  Arman Filter., MPH, MS, OTR/L ascom [phone] 06/26/23, 10:14 AM

## 2023-06-26 NOTE — ED Notes (Signed)
PT at beside

## 2023-06-26 NOTE — ED Notes (Signed)
Pt resting quietly with eyes closed. Respirations even and non labored. Cardiac monitoring remains in place. CB remains within reach. No needs voiced at this time.

## 2023-06-26 NOTE — ED Notes (Signed)
Echo at bedside

## 2023-06-26 NOTE — Progress Notes (Signed)
Patient feels completely back to baseline  On exam, she is awake, alert, interactive and appropriate.  She has no drift in either upper extremity, intact sensation, no ataxia.  She has poor effort with lifting her legs, but I think this is due to positioning.   LDL 96 A1c 5.1 Echo without embolic source CTA with only mild atherosclerotic disease. MRI with no acute stroke, but she does have an old cerebellar stroke  Impression: 78 year old female who presents with transient ischemic attack.  It is unclear if she actually has a history of atrial fibrillation, and therefore I would favor antiplatelet therapy for the time being.  With the question, I do think prolonged cardiac monitoring would be prudent.  She has had some issues with statins in the past, and therefore I would start off low and increase slowly as tolerated to a goal LDL less than 70.  1) Lipitor 10 mg daily, with the goal to increase to a goal LDL less than 70 if tolerated 2) Plavix 75 mg daily 3) BP control with goal normotension 4) prolonged cardiac monitoring to assess for atrial fibrillation 5) neurology will be available as needed, please call with further questions or concerns.  Ritta Slot, MD [facility] [phone]  If 7pm- 7am, please page neurology on call as listed in AMION.
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
## 2023-06-26 NOTE — Progress Notes (Addendum)
SLP Cancellation Note  Patient Details Name: Audrey Martinez MRN: [id number] DOB: Oct 16, 1945   Cancelled treatment:       Reason Eval/Treat Not Completed: SLP screened, no needs identified, will sign off. Pt screened for cognitive communication ability in the setting of TIA (MRI is negative for stroke. CTA negative for LVO). Pt pleasant, attention intact despite complex environment (hallway of ED). Speech intelligible with no overt facial weakness (numbness not impacting speech production). Independent use of cellular device to aid orientation. Expressive/receptive language intact for completion of pt interview. Pt denied concern regarding cognition, reporting at/approaching cognitive baseline.   Family not present for verification. Education shared regarding contacting PCP in the setting of cognitive concerns upon discharge. Please re-consult in the setting of acute concerns for cognition.   Addendum: OT/PT expressing concern regarding cognitive/cognitive decline. Recommend further neuropsych eval/ HH speech therapy assessment for further work up.   [place] Khiara Shuping Clapp  MS [facility] SLP    [place] J Clapp 06/26/2023, 10:41 AM

## 2023-06-26 NOTE — Progress Notes (Signed)
*  PRELIMINARY RESULTS* Echocardiogram 2D Echocardiogram has been performed.  Audrey Martinez 06/26/2023, 8:30 AM

## 2023-06-26 NOTE — Discharge Summary (Signed)
Physician Discharge Summary   Patient: Audrey Martinez MRN: [id number]  DOB: [birth year]   Admit:     Date of Admission: 06/25/2023 Admitted from: home   Discharge: Date of discharge: 06/26/23 Disposition: Home Condition at discharge: good  CODE STATUS: FULL CODE     Discharge Physician: Sunnie Nielsen, DO [facility]     PCP: [name], MD  Recommendations for Outpatient Follow-up:  Follow up with PCP [name], MD in 1-2 weeks Follow up with [facility] neurology Follow up with [facility] Cardiology or PCP for cardiac monitor results     Discharge Instructions     Ambulatory referral to Neurology   Complete by: As directed    [facility] NEUROLOGY FOLLOW UP IN 6-8 WEEKS   Diet - low sodium heart healthy   Complete by: As directed    Increase activity slowly   Complete by: As directed          Discharge Diagnoses: Principal Problem:   TIA (transient ischemic attack) Active Problems:   HLD (hyperlipidemia)   Essential hypertension   HFrEF (heart failure with reduced ejection fraction) (HCC)   Intermittent asthma without complication   CAD (coronary artery disease)   Chronic kidney disease, stage 3a (HCC)   Obesity (BMI 30-39.9)        Hospital course / significant events:   HPI: Audrey Martinez is a 78 y.o. female with medical history significant of PAF not on anticoagulants, septic PE (finished the Xarelto treatment), HLD, asthma, CAD, sCHF with EF 30-35%, CKD-3a, who presents with left facial numbness, left eye double vision and blurry vision   01/26: CT of head is negative for acute intracranial abnormalities. CTA of head and neck negative for LVO. MRI of brain is negative for acute stroke, (there is remote R cerebellar infarct)  01/27: no concerns on echo. Per neurology, ok for dc on Plavix and follow outpatient w/ Zio monitor      Consultants:  Neurology    Procedures/Surgeries: none      ASSESSMENT & PLAN:    TIA (transient ischemic attack) MRI is negative for stroke.  CTA negative for LVO.  Her symptoms only involve left face and the left eye, another potential differential diagnosis Bell's palsy.  On examination, patient does not have left facial droop, not very typical for Bell's palsy.  Patient denies recent viral infection symptoms. Consulted  teleneurologist, Dr. Armanda Magic recommended starting Plavix and aspirin, however patient has anaphylactic reaction to aspirin.  Will start Plavix alone. 300 mg of Plavix is given in ED, will continue 75 mg daily continue Zetia (patient is allergic to statin) Patient has history of PAF, Zio to mail to patient / follow w/ cardiology  Hold oral BP meds to allow permissive HTN, can restart on discharge  fasting lipid panel and HbA1c  2D transthoracic echocardiography --> no concerns    HLD (hyperlipidemia) Statin intolerance  Zetia   Essential hypertension Resume home med s  HFrEF (heart failure with reduced ejection fraction)  Echo on 01/09/2020 showed EF 30 to 35%.   CHF is compensated. Not taking diuretics  Follow w/ cardiology    Intermittent asthma without complication Stable Bronchodilators and as needed Mucinex   CAD (coronary artery disease) Plavix and Zetia   Chronic kidney disease, stage 3a  Renal function stable.  Recent baseline creatinine 1.46 on 06/24/2022.  Her creatinine is 1.20, BUN 27, GFR 47. Follow BMP  Statin and aspirin intolerance  Plavix  as above Zetia as above            Discharge Instructions  Allergies as of 06/26/2023       Reactions   Amoxicillin Hives, Shortness Of Breath, Swelling   Did it involve swelling of the face/tongue/throat, SOB, or low BP? Yes Did it involve sudden or severe rash/hives, skin peeling, or any reaction on the inside of your mouth or nose? Yes Did you need to seek medical attention at a hospital or doctor's  office? Yes When did it last happen? 1998      If all above answers are "NO", may proceed with cephalosporin use.   Aspirin Anaphylaxis, Shortness Of Breath   Bacitracin-polymyx-neo-hc [bacitra-neomycin-polymyxin-hc] Rash   Ciprofloxacin Rash   Ciprofloxacin-dexamethasone Anaphylaxis   Crestor [rosuvastatin] Other (See Comments)   Caused problems with memory.   Erythromycin Hives, Rash   Hydrochlorothiazide Other (See Comments), Palpitations   Raises her blood pressure.    Ibuprofen Other (See Comments)   Sharp stomach pains   Morphine And Codeine Nausea And Vomiting   Morpholine Salicylate Nausea Only   Omeprazole Palpitations, Other (See Comments)   Tachycardia and same sensation as her SVTs per patient   Doxycycline Hyclate Hives   Keflex [cephalexin] Hives   Lovastatin Other (See Comments)   "Unable to function"   Simvastatin Other (See Comments)   "Unable to function"   Neomycin-bacitracin Zn-polymyx Hives, Rash   Tape Hives, Rash        Medication List     STOP taking these medications    Arnuity Ellipta 200 MCG/ACT Aepb Generic drug: Fluticasone Furoate   baclofen 10 MG tablet Commonly known as: LIORESAL   famotidine 20 MG tablet Commonly known as: PEPCID   gabapentin 300 MG capsule Commonly known as: NEURONTIN   HYDROcodone-acetaminophen 5-325 MG tablet Commonly known as: NORCO/VICODIN   Jardiance 10 MG Tabs tablet Generic drug: empagliflozin   montelukast 10 MG tablet Commonly known as: SINGULAIR   rivaroxaban 20 MG Tabs tablet Commonly known as: XARELTO   spironolactone 25 MG tablet Commonly known as: ALDACTONE   Striverdi Respimat 2.5 MCG/ACT Aers Generic drug: Olodaterol HCl       TAKE these medications    atorvastatin 10 MG tablet Commonly known as: LIPITOR Take 4 tablets (40 mg total) by mouth daily.   B-complex with vitamin C tablet Take 1 tablet by mouth daily.   calcium carbonate 1250 (500 Ca) MG tablet Commonly known as:  OS-CAL - dosed in mg of elemental calcium Take 1 tablet by mouth.   carvedilol 6.25 MG tablet Commonly known as: COREG Take 1 tablet (6.25 mg total) by mouth 2 (two) times daily with a meal.   cholecalciferol 25 MCG (1000 UNIT) tablet Commonly known as: VITAMIN D3 Take 1,000 Units by mouth daily.   clopidogrel 75 MG tablet Commonly known as: PLAVIX Take 1 tablet (75 mg total) by mouth daily. Start taking on: June 27, 2023   Cranberry 1000 MG Caps Take 1,000 mg by mouth daily.   Entresto 24-26 MG Generic drug: sacubitril-valsartan TAKE 1 TABLET BY MOUTH TWICE A DAY   Ester-C Tabs Take 1 tablet by mouth 2 (two) times daily.   ezetimibe 10 MG tablet Commonly known as: ZETIA TAKE 1 TABLET BY MOUTH EVERY DAY   GINGER PO Take 1,100 mg by mouth daily.   multivitamin with minerals tablet Take 1 tablet by mouth daily. Centrum Silver   PROBIOTIC DAILY PO Take 1 capsule by mouth 2 (two)  times a week.   TART CHERRY ADVANCED PO Take 1 capsule by mouth daily.   Turmeric 500 MG Caps Take 500 mg by mouth daily.   zinc sulfate (50mg  elemental zinc) 220 (50 Zn) MG capsule Take 1 capsule (220 mg total) by mouth daily.          Allergies  Allergen Reactions   Amoxicillin Hives, Shortness Of Breath and Swelling    Did it involve swelling of the face/tongue/throat, SOB, or low BP? Yes Did it involve sudden or severe rash/hives, skin peeling, or any reaction on the inside of your mouth or nose? Yes Did you need to seek medical attention at a hospital or doctor's office? Yes When did it last happen? 1998      If all above answers are "NO", may proceed with cephalosporin use.    Aspirin Anaphylaxis and Shortness Of Breath   Bacitracin-Polymyx-Neo-Hc [Bacitra-Neomycin-Polymyxin-Hc] Rash   Ciprofloxacin Rash   Ciprofloxacin-Dexamethasone Anaphylaxis   Crestor [Rosuvastatin] Other (See Comments)    Caused problems with memory.   Erythromycin Hives and Rash    Hydrochlorothiazide Other (See Comments) and Palpitations    Raises her blood pressure.    Ibuprofen Other (See Comments)    Sharp stomach pains   Morphine And Codeine Nausea And Vomiting   Morpholine Salicylate Nausea Only   Omeprazole Palpitations and Other (See Comments)    Tachycardia and same sensation as her SVTs per patient   Doxycycline Hyclate Hives   Keflex [Cephalexin] Hives   Lovastatin Other (See Comments)    "Unable to function"   Simvastatin Other (See Comments)    "Unable to function"   Neomycin-Bacitracin Zn-Polymyx Hives and Rash   Tape Hives and Rash     Subjective: pt reports feeling fine, no new weakness or other concerns. Pt has been ambulating okay, eager for discharge home    Discharge Exam: BP 134/81 (BP Location: Right Arm)   Pulse (!) 102   Temp 98.5 F (36.9 C) (Oral)   Resp 20   Wt 89.7 kg   SpO2 97%   BMI 31.90 kg/m  General: Pt is alert, awake, not in acute distress Cardiovascular: RRR, S1/S2 +, no rubs, no gallops Respiratory: CTA bilaterally, no wheezing, no rhonchi Abdominal: Soft, NT, ND, bowel sounds + Extremities: no edema, no cyanosis     The results of significant diagnostics from this hospitalization (including imaging, microbiology, ancillary and laboratory) are listed below for reference.     Microbiology: No results found for this or any previous visit (from the past 240 hours).   Labs: BNP (last 3 results) No results for input(s): "BNP" in the last 8760 hours. Basic Metabolic Panel: Recent Labs  Lab 06/25/23 1638 06/26/23 0357  NA 139 141  K 4.6 4.0  CL 104 108  CO2 26 25  GLUCOSE 106* 95  BUN 27* 23  CREATININE 1.20* 1.05*  CALCIUM 9.1 8.7*   Liver Function Tests: Recent Labs  Lab 06/25/23 1638  AST 22  ALT 17  ALKPHOS 66  BILITOT 0.8  PROT 6.1*  ALBUMIN 3.7   No results for input(s): "LIPASE", "AMYLASE" in the last 168 hours. No results for input(s): "AMMONIA" in the last 168 hours. CBC: Recent  Labs  Lab 06/25/23 1638  WBC 6.3  NEUTROABS 3.5  HGB 13.9  HCT 42.8  MCV 96.6  PLT 177   Cardiac Enzymes: No results for input(s): "CKTOTAL", "CKMB", "CKMBINDEX", "TROPONINI" in the last 168 hours. BNP: Invalid input(s): "POCBNP" CBG: Recent  Labs  Lab 06/25/23 1630  GLUCAP 106*   D-Dimer No results for input(s): "DDIMER" in the last 72 hours. Hgb A1c Recent Labs    06/25/23 1638  HGBA1C 5.1   Lipid Profile Recent Labs    06/26/23 0357  CHOL 175  HDL 59  LDLCALC 96  TRIG 99  CHOLHDL 3.0   Thyroid function studies No results for input(s): "TSH", "T4TOTAL", "T3FREE", "THYROIDAB" in the last 72 hours.  Invalid input(s): "FREET3" Anemia work up No results for input(s): "VITAMINB12", "FOLATE", "FERRITIN", "TIBC", "IRON", "RETICCTPCT" in the last 72 hours. Urinalysis    Component Value Date/Time   BILIRUBINUR neg 11/25/2019 1115   PROTEINUR Negative 11/25/2019 1115   UROBILINOGEN 0.2 11/25/2019 1115   NITRITE neg 11/25/2019 1115   LEUKOCYTESUR Moderate (2+) (A) 11/25/2019 1115   Sepsis Labs Recent Labs  Lab 06/25/23 1638  WBC 6.3   Microbiology No results found for this or any previous visit (from the past 240 hours). Imaging No results found.    Time coordinating discharge: over 30 minutes  SIGNED:  Sunnie Nielsen DO [facility]

## 2023-06-26 NOTE — Hospital Course (Signed)
Hospital course / significant events:   HPI: Audrey Martinez is a 78 y.o. female with medical history significant of PAF not on anticoagulants, septic PE (finished the Xarelto treatment), HLD, asthma, CAD, sCHF with EF 30-35%, CKD-3a, who presents with left facial numbness, left eye double vision and blurry vision   01/26: CT of head is negative for acute intracranial abnormalities. CTA of head and neck negative for LVO. MRI of brain is negative for acute stroke, (there is remote R cerebellar infarct)  01/27:      Consultants:  ***  Procedures/Surgeries: ***      ASSESSMENT & PLAN:    TIA (transient ischemic attack) MRI is negative for stroke.  CTA negative for LVO.  Her symptoms only involve left face and the left eye, another potential differential diagnosis Bell's palsy.  On examination, patient does not have left facial droop, not very typical for Bell's palsy.  Patient denies recent viral infection symptoms. Consulted  teleneurologist, Dr. Armanda Magic recommended starting Plavix and aspirin, however patient has anaphylactic reaction to aspirin.  Will start Plavix alone. 300 mg of Plavix is given in ED, will continue 75 mg daily continue Zetia (patient is allergic to statin) Patient has history of PAF, will need chronic anticoagulants after acute phase of TIA is over Hold oral Bp meds to allow permissive HTN fasting lipid panel and HbA1c  2D transthoracic echocardiography --> ***   HLD (hyperlipidemia) Statin intolerance  Zetia   Essential hypertension - prn IV hydralazine for SBP>220 or dBP>110  HFrEF (heart failure with reduced ejection fraction)  Echo on 01/09/2020 showed EF 30 to 35%.   CHF is compensated. Hold diuretics in the setting of TIA *** diuretics    Intermittent asthma without complication Stable Bronchodilators and as needed Mucinex   CAD (coronary artery disease) Plavix and Zetia   Chronic kidney disease, stage 3a  Renal function stable.   Recent baseline creatinine 1.46 on 06/24/2022.  Her creatinine is 1.20, BUN 27, GFR 47. Follow BMP  Statin and aspirin intolerance  Plavix as above Zetia as above     *** based on BMI: Body mass index is 31.9 kg/m.  Underweight - under 18  overweight - 25 to 29 obese - 30 or more Class 1 obesity: BMI of 30.0 to 34 Class 2 obesity: BMI of 35.0 to 39 Class 3 obesity: BMI of 40.0 to 49 Super Morbid Obesity: BMI 50-59 Super-super Morbid Obesity: BMI 60+ Significantly low or high BMI is associated with higher medical risk.  Weight management advised as adjunct to other disease management and risk reduction treatments    DVT prophylaxis: *** IV fluids: *** continuous IV fluids  Nutrition: *** Central lines / invasive devices: ***  Code Status: *** ACP documentation reviewed: *** none on file in [facility]  TOC needs: *** Barriers to dispo / significant pending items: ***

## 2023-06-27 DIAGNOSIS — W19XXXD Unspecified fall, subsequent encounter: Secondary | ICD-10-CM | POA: Diagnosis not present

## 2023-06-27 DIAGNOSIS — I509 Heart failure, unspecified: Secondary | ICD-10-CM | POA: Diagnosis not present

## 2023-06-27 DIAGNOSIS — Z791 Long term (current) use of non-steroidal anti-inflammatories (NSAID): Secondary | ICD-10-CM | POA: Diagnosis not present

## 2023-06-27 DIAGNOSIS — Z556 Problems related to health literacy: Secondary | ICD-10-CM | POA: Diagnosis not present

## 2023-06-27 DIAGNOSIS — Z86711 Personal history of pulmonary embolism: Secondary | ICD-10-CM | POA: Diagnosis not present

## 2023-06-27 DIAGNOSIS — R4181 Age-related cognitive decline: Secondary | ICD-10-CM | POA: Diagnosis not present

## 2023-06-28 DIAGNOSIS — R4181 Age-related cognitive decline: Secondary | ICD-10-CM | POA: Diagnosis not present

## 2023-06-28 DIAGNOSIS — I509 Heart failure, unspecified: Secondary | ICD-10-CM | POA: Diagnosis not present

## 2023-06-28 DIAGNOSIS — Z86711 Personal history of pulmonary embolism: Secondary | ICD-10-CM | POA: Diagnosis not present

## 2023-06-28 DIAGNOSIS — W19XXXD Unspecified fall, subsequent encounter: Secondary | ICD-10-CM | POA: Diagnosis not present

## 2023-06-28 DIAGNOSIS — Z556 Problems related to health literacy: Secondary | ICD-10-CM | POA: Diagnosis not present

## 2023-06-28 DIAGNOSIS — Z791 Long term (current) use of non-steroidal anti-inflammatories (NSAID): Secondary | ICD-10-CM | POA: Diagnosis not present

## 2023-06-30 ENCOUNTER — Telehealth: Payer: Self-pay | Admitting: Internal Medicine

## 2023-06-30 NOTE — Telephone Encounter (Signed)
Alona Bene with [facility] is calling in because she faxed over a DME order for a rollator for this pt on 06/20/23 and 06/29/23. Alona Bene says she has not heard anything back and wants to make sure it has been received. Alona Bene can be reached at [phone] ext [phone] , the line is secured.

## 2023-06-30 NOTE — Telephone Encounter (Signed)
Paperwork received and in Dr Jaclynn Guarneri box. Dr [name] is on vacation until Feb 5th, and we will review these papers when she returns.  - Simrit Gohlke

## 2023-07-02 DIAGNOSIS — G459 Transient cerebral ischemic attack, unspecified: Secondary | ICD-10-CM | POA: Diagnosis not present

## 2023-07-04 DIAGNOSIS — Z556 Problems related to health literacy: Secondary | ICD-10-CM | POA: Diagnosis not present

## 2023-07-04 DIAGNOSIS — I509 Heart failure, unspecified: Secondary | ICD-10-CM | POA: Diagnosis not present

## 2023-07-04 DIAGNOSIS — Z791 Long term (current) use of non-steroidal anti-inflammatories (NSAID): Secondary | ICD-10-CM | POA: Diagnosis not present

## 2023-07-04 DIAGNOSIS — W19XXXD Unspecified fall, subsequent encounter: Secondary | ICD-10-CM | POA: Diagnosis not present

## 2023-07-04 DIAGNOSIS — R4181 Age-related cognitive decline: Secondary | ICD-10-CM | POA: Diagnosis not present

## 2023-07-04 DIAGNOSIS — Z86711 Personal history of pulmonary embolism: Secondary | ICD-10-CM | POA: Diagnosis not present

## 2023-07-06 ENCOUNTER — Encounter: Payer: Self-pay | Admitting: Internal Medicine

## 2023-07-06 ENCOUNTER — Ambulatory Visit (INDEPENDENT_AMBULATORY_CARE_PROVIDER_SITE_OTHER): Payer: Medicare Other | Admitting: Internal Medicine

## 2023-07-06 VITALS — BP 130/68 | HR 131 | Ht 66.0 in | Wt 193.0 lb

## 2023-07-06 DIAGNOSIS — I10 Essential (primary) hypertension: Secondary | ICD-10-CM

## 2023-07-06 DIAGNOSIS — Z9181 History of falling: Secondary | ICD-10-CM | POA: Diagnosis not present

## 2023-07-06 DIAGNOSIS — I509 Heart failure, unspecified: Secondary | ICD-10-CM | POA: Diagnosis not present

## 2023-07-06 DIAGNOSIS — Z0000 Encounter for general adult medical examination without abnormal findings: Secondary | ICD-10-CM | POA: Diagnosis not present

## 2023-07-06 DIAGNOSIS — Z1231 Encounter for screening mammogram for malignant neoplasm of breast: Secondary | ICD-10-CM

## 2023-07-06 DIAGNOSIS — Z791 Long term (current) use of non-steroidal anti-inflammatories (NSAID): Secondary | ICD-10-CM | POA: Diagnosis not present

## 2023-07-06 DIAGNOSIS — D034 Melanoma in situ of scalp and neck: Secondary | ICD-10-CM

## 2023-07-06 DIAGNOSIS — J4520 Mild intermittent asthma, uncomplicated: Secondary | ICD-10-CM | POA: Diagnosis not present

## 2023-07-06 DIAGNOSIS — G459 Transient cerebral ischemic attack, unspecified: Secondary | ICD-10-CM | POA: Diagnosis not present

## 2023-07-06 DIAGNOSIS — W19XXXD Unspecified fall, subsequent encounter: Secondary | ICD-10-CM | POA: Diagnosis not present

## 2023-07-06 DIAGNOSIS — R4181 Age-related cognitive decline: Secondary | ICD-10-CM | POA: Diagnosis not present

## 2023-07-06 DIAGNOSIS — I5020 Unspecified systolic (congestive) heart failure: Secondary | ICD-10-CM

## 2023-07-06 DIAGNOSIS — E782 Mixed hyperlipidemia: Secondary | ICD-10-CM

## 2023-07-06 DIAGNOSIS — I4729 Other ventricular tachycardia: Secondary | ICD-10-CM

## 2023-07-06 DIAGNOSIS — Z556 Problems related to health literacy: Secondary | ICD-10-CM | POA: Diagnosis not present

## 2023-07-06 DIAGNOSIS — Z86711 Personal history of pulmonary embolism: Secondary | ICD-10-CM | POA: Diagnosis not present

## 2023-07-06 MED ORDER — EZETIMIBE 10 MG PO TABS: 10.0000 mg | ORAL_TABLET | Freq: Every day | ORAL | 2 refills | Status: DC

## 2023-07-06 NOTE — Assessment & Plan Note (Signed)
 Stable symptoms - followed by Pulmonary On no daily medications

## 2023-07-06 NOTE — Assessment & Plan Note (Signed)
 Controlled BP with normal exam. Current regimen is Coreg and Entresto. Will continue same medications; encourage continued reduced sodium diet.

## 2023-07-06 NOTE — Assessment & Plan Note (Signed)
 Stable symptoms with controlled BP Followed by Cardiology - on Entresto  and Coreg 

## 2023-07-06 NOTE — Patient Instructions (Addendum)
 Start baby aspirin 81 mg daily.  Call [facility] to schedule your mammogram at [phone].   Schedule a follow up with Cardiology in about a month.

## 2023-07-06 NOTE — Assessment & Plan Note (Addendum)
 Two weeks ago admitted for workup Started on Atorvastatin  and Plavix  but she does not want to take either due to past ASE. Recommend ASA 81 mg daily Long term monitor in place  Recommend follow up with Cardiology

## 2023-07-06 NOTE — Progress Notes (Signed)
 Date:  07/06/2023   Name:  Audrey Martinez   DOB:  [birth year]   MRN:  [id number]   Chief Complaint: Annual Exam Audrey Martinez is a 77 y.o. female who presents today for her Complete Annual Exam. She feels well. She reports exercising. She reports she is sleeping well. Breast complaints - none.  Health Maintenance  Topic Date Due   COVID-19 Vaccine (1) Never done   Zoster (Shingles) Vaccine (1 of 2) Never done   Flu Shot  08/28/2023*   Mammogram  12/27/2023   Medicare Annual Wellness Visit  04/23/2024   Colon Cancer Screening  05/03/2024   DTaP/Tdap/Td vaccine (3 - Td or Tdap) 06/08/2032   Pneumonia Vaccine  Completed   DEXA scan (bone density measurement)  Completed   Hepatitis C Screening  Completed   HPV Vaccine  Aged Out  *Topic was postponed. The date shown is not the original due date.    Hypertension This is a chronic problem. The problem is controlled. Pertinent negatives include no chest pain, headaches, palpitations or shortness of breath. Past treatments include beta blockers and angiotensin blockers.  Hyperlipidemia This is a chronic problem. The problem is controlled. Pertinent negatives include no chest pain or shortness of breath. Current antihyperlipidemic treatment includes statins and ezetimibe .  TIA -  recent event -heart monitor in place.  She declines to take a statin or Plavix .    Review of Systems  Constitutional:  Negative for fatigue and unexpected weight change.  HENT:  Negative for nosebleeds and trouble swallowing.   Eyes:  Negative for visual disturbance.  Respiratory:  Negative for cough, chest tightness, shortness of breath and wheezing.   Cardiovascular:  Negative for chest pain, palpitations and leg swelling.  Gastrointestinal:  Negative for abdominal pain, constipation and diarrhea.  Genitourinary:  Negative for dysuria and urgency.  Musculoskeletal:  Positive for arthralgias (right hip).  Skin:  Negative for color change and rash.   Neurological:  Negative for dizziness, weakness, light-headedness and headaches.  Psychiatric/Behavioral:  Negative for dysphoric mood and sleep disturbance. The patient is not nervous/anxious.      Lab Results  Component Value Date   NA 141 06/26/2023   K 4.0 06/26/2023   CO2 25 06/26/2023   GLUCOSE 95 06/26/2023   BUN 23 06/26/2023   CREATININE 1.05 (H) 06/26/2023   CALCIUM  8.7 (L) 06/26/2023   EGFR 38 (L) 06/24/2022   GFRNONAA 55 (L) 06/26/2023   Lab Results  Component Value Date   CHOL 175 06/26/2023   HDL 59 06/26/2023   LDLCALC 96 06/26/2023   TRIG 99 06/26/2023   CHOLHDL 3.0 06/26/2023   Lab Results  Component Value Date   TSH 2.630 06/24/2022   Lab Results  Component Value Date   HGBA1C 5.1 06/25/2023   Lab Results  Component Value Date   WBC 6.3 06/25/2023   HGB 13.9 06/25/2023   HCT 42.8 06/25/2023   MCV 96.6 06/25/2023   PLT 177 06/25/2023   Lab Results  Component Value Date   ALT 17 06/25/2023   AST 22 06/25/2023   ALKPHOS 66 06/25/2023   BILITOT 0.8 06/25/2023   Lab Results  Component Value Date   VD25OH 34.76 11/25/2019     Patient Active Problem List   Diagnosis Date Noted   TIA (transient ischemic attack) 06/25/2023   CAD (coronary artery disease) 06/25/2023   Chronic kidney disease, stage 3a (HCC) 06/25/2023   Age-related cognitive decline 06/15/2023   Encounter for  long-term (current) use of non-steroidal anti-inflammatories 06/15/2023   Personal history of PE (pulmonary embolism) 06/15/2023   Cause of injury, accidental fall, subsequent encounter 06/15/2023   Problem related to health literacy 06/15/2023   Osteoarthritis of right hip joint due to dysplasia 03/14/2023   Constipation 12/16/2022   HFrEF (heart failure with reduced ejection fraction) (HCC) 06/24/2022   Melanoma in situ of neck (HCC) 06/22/2021   Trochanteric bursitis of right hip 05/18/2021   Hx of colonic polyps    Atrial flutter (HCC) 01/31/2020   Obesity (BMI  30-39.9) 01/31/2020   Left lower lobe pulmonary nodule 01/29/2020   Dilated cardiomyopathy (HCC) 05/02/2019   Amaurosis fugax of right eye 01/13/2019   Lumbar radiculopathy 05/18/2017   Lumbar degenerative disc disease 05/18/2017   Spinal stenosis of lumbar region without neurogenic claudication 05/18/2017   Atrial tachycardia (HCC) 04/14/2017   SVT (supraventricular tachycardia) (HCC) 04/14/2017   Essential hypertension 04/14/2017   Intermittent asthma without complication 04/14/2017   Osteoporosis 03/21/2017   Benign essential tremor 03/21/2017   Hx of atrioventricular node ablation 02/01/2008   Mixed hyperlipidemia 05/12/2003    Allergies  Allergen Reactions   Amoxicillin Hives, Shortness Of Breath and Swelling    Did it involve swelling of the face/tongue/throat, SOB, or low BP? Yes Did it involve sudden or severe rash/hives, skin peeling, or any reaction on the inside of your mouth or nose? Yes Did you need to seek medical attention at a hospital or doctor's office? Yes When did it last happen? 1998      If all above answers are "NO", may proceed with cephalosporin use.    Aspirin Anaphylaxis and Shortness Of Breath   Bacitracin-Polymyx-Neo-Hc [Bacitra-Neomycin-Polymyxin-Hc] Rash   Ciprofloxacin Rash   Ciprofloxacin-Dexamethasone  Anaphylaxis   Crestor  [Rosuvastatin ] Other (See Comments)    Caused problems with memory.   Erythromycin Hives and Rash   Hydrochlorothiazide Other (See Comments) and Palpitations    Raises her blood pressure.    Ibuprofen Other (See Comments)    Sharp stomach pains   Morphine  And Codeine Nausea And Vomiting   Morpholine Salicylate Nausea Only   Omeprazole Palpitations and Other (See Comments)    Tachycardia and same sensation as her SVTs per patient   Doxycycline Hyclate Hives   Keflex  [Cephalexin ] Hives   Lovastatin Other (See Comments)    Unable to function   Simvastatin Other (See Comments)    Unable to function    Neomycin-Bacitracin Zn-Polymyx Hives and Rash   Tape Hives and Rash    Past Surgical History:  Procedure Laterality Date   BACK SURGERY  2017   L2-4 laminectomy and foraminal stenosis   BREAST CYST ASPIRATION Left 1977   CARDIAC CATHETERIZATION  2009   [facility] [place] --[facility]   CARDIAC ELECTROPHYSIOLOGY STUDY AND ABLATION  2009   CATARACT EXTRACTION, BILATERAL  2010   COLONOSCOPY WITH PROPOFOL  N/A 03/21/2018   Procedure: COLONOSCOPY WITH Biopsy;  Surgeon: Unk Corinn Skiff, MD;  Location: [facility];  Service: Endoscopy;  Laterality: N/A;   COLONOSCOPY WITH PROPOFOL  N/A 05/03/2021   Procedure: COLONOSCOPY WITH PROPOFOL ;  Surgeon: Unk Corinn Skiff, MD;  Location: [facility] END[facility];  Service: Gastroenterology;  Laterality: N/A;   CORONARY ANGIOPLASTY     derrick procedure  1977-78   bilateral    ESOPHAGOGASTRODUODENOSCOPY  2013   gastritis; done for complaint of dysphagia   ESOPHAGOGASTRODUODENOSCOPY (EGD) WITH PROPOFOL  N/A 03/21/2018   Procedure: ESOPHAGOGASTRODUODENOSCOPY (EGD) WITH Biopsies;  Surgeon: Unk Corinn Skiff, MD;  Location:  [facility];  Service: Endoscopy;  Laterality: N/A;  KEEP THIS PATIENT FIRST   JOINT REPLACEMENT     LEFT HEART CATH AND CORONARY ANGIOGRAPHY Left 05/02/2019   Procedure: LEFT HEART CATH AND CORONARY ANGIOGRAPHY;  Surgeon: Perla Evalene PARAS, MD;  Location: [facility] INVASIVE CV LAB;  Service: Cardiovascular;  Laterality: Left;   lymph node removal     neck   MOHS SURGERY  07/2016   BCCA nose   POLYPECTOMY N/A 03/21/2018   Procedure: POLYPECTOMY;  Surgeon: Unk Corinn Skiff, MD;  Location: [facility];  Service: Endoscopy;  Laterality: N/A;   REPLACEMENT TOTAL KNEE  2013   RT   TOTAL SHOULDER REPLACEMENT  2012   RT     Social History   Tobacco Use   Smoking status: Never   Smokeless tobacco: Never  Vaping Use   Vaping status: Never Used  Substance Use Topics   Alcohol use: Not Currently   Drug  use: No     Medication list has been reviewed and updated.  Current Meds  Medication Sig   B Complex-C (B-COMPLEX WITH VITAMIN C) tablet Take 1 tablet by mouth daily.    Bioflavonoid Products (ESTER-C) TABS Take 1 tablet by mouth 2 (two) times daily.    calcium  carbonate (OS-CAL - DOSED IN MG OF ELEMENTAL CALCIUM ) 1250 (500 Ca) MG tablet Take 1 tablet by mouth.   carvedilol  (COREG ) 6.25 MG tablet Take 1 tablet (6.25 mg total) by mouth 2 (two) times daily with a meal.   cholecalciferol  (VITAMIN D ) 25 MCG (1000 UT) tablet Take 1,000 Units by mouth daily.   Cranberry 1000 MG CAPS Take 1,000 mg by mouth daily.    Ginger, Zingiber officinalis, (GINGER PO) Take 1,100 mg by mouth daily.    Misc Natural Products (TART CHERRY ADVANCED PO) Take 1 capsule by mouth daily.    Multiple Vitamins-Minerals (MULTIVITAMIN WITH MINERALS) tablet Take 1 tablet by mouth daily. Centrum Silver   Probiotic Product (PROBIOTIC DAILY PO) Take 1 capsule by mouth 2 (two) times a week.    sacubitril -valsartan  (ENTRESTO ) 24-26 MG TAKE 1 TABLET BY MOUTH TWICE A DAY   Turmeric 500 MG CAPS Take 500 mg by mouth daily.    zinc  sulfate 220 (50 Zn) MG capsule Take 1 capsule (220 mg total) by mouth daily.   [DISCONTINUED] atorvastatin  (LIPITOR) 10 MG tablet Take 4 tablets (40 mg total) by mouth daily.   [DISCONTINUED] clopidogrel  (PLAVIX ) 75 MG tablet Take 1 tablet (75 mg total) by mouth daily.   [DISCONTINUED] ezetimibe  (ZETIA ) 10 MG tablet TAKE 1 TABLET BY MOUTH EVERY DAY       07/06/2023    8:30 AM 06/24/2022    8:12 AM 03/28/2022   10:39 AM 06/22/2021    8:25 AM  GAD 7 : Generalized Anxiety Score  Nervous, Anxious, on Edge 0 0 0 0  Control/stop worrying 0 0 0 0  Worry too much - different things 0 0 0 0  Trouble relaxing 0 0 0 0  Restless 0 0 0 0  Easily annoyed or irritable 0 0 0 0  Afraid - awful might happen 0 0 0 0  Total GAD 7 Score 0 0 0 0  Anxiety Difficulty Not difficult at all Not difficult at all Not  difficult at all Not difficult at all       07/06/2023    8:30 AM 04/24/2023    4:02 PM 06/24/2022    8:12 AM  Depression screen PHQ  2/9  Decreased Interest 0 0 0  Down, Depressed, Hopeless 0 0 0  PHQ - 2 Score 0 0 0  Altered sleeping 0 0 0  Tired, decreased energy 0 0 0  Change in appetite 0 0 0  Feeling bad or failure about yourself  0 0 0  Trouble concentrating 0 0 2  Moving slowly or fidgety/restless 0 0 0  Suicidal thoughts  0 0  PHQ-9 Score 0 0 2  Difficult doing work/chores Not difficult at all Not difficult at all Not difficult at all    BP Readings from Last 3 Encounters:  07/06/23 130/68  06/26/23 134/81  05/30/23 (!) 154/108    Physical Exam Vitals and nursing note reviewed.  Constitutional:      General: She is not in acute distress.    Appearance: She is well-developed.  HENT:     Head: Normocephalic and atraumatic.     Right Ear: Tympanic membrane and ear canal normal.     Left Ear: Tympanic membrane and ear canal normal.     Nose:     Right Sinus: No maxillary sinus tenderness.     Left Sinus: No maxillary sinus tenderness.  Eyes:     General: No scleral icterus.       Right eye: No discharge.        Left eye: No discharge.     Conjunctiva/sclera: Conjunctivae normal.  Neck:     Thyroid : No thyromegaly.     Vascular: No carotid bruit.  Cardiovascular:     Rate and Rhythm: Normal rate and regular rhythm.     Pulses: Normal pulses.     Heart sounds: Normal heart sounds.  Pulmonary:     Effort: Pulmonary effort is normal. No respiratory distress.     Breath sounds: No wheezing.  Chest:     Comments: Zio patch on left upper chest Abdominal:     General: Bowel sounds are normal.     Palpations: Abdomen is soft.     Tenderness: There is no abdominal tenderness.  Musculoskeletal:     Cervical back: Normal range of motion. No erythema.     Right lower leg: No edema.     Left lower leg: No edema.  Lymphadenopathy:     Cervical: No cervical  adenopathy.  Skin:    General: Skin is warm and dry.     Findings: No rash.  Neurological:     Mental Status: She is alert and oriented to person, place, and time.     Cranial Nerves: No cranial nerve deficit.     Sensory: No sensory deficit.     Deep Tendon Reflexes: Reflexes are normal and symmetric.  Psychiatric:        Attention and Perception: Attention normal.        Mood and Affect: Mood normal.     Wt Readings from Last 3 Encounters:  07/06/23 193 lb (87.5 kg)  06/25/23 197 lb 10.3 oz (89.7 kg)  04/24/23 200 lb (90.7 kg)    BP 130/68   Pulse (!) 131   Ht 5' 6 (1.676 m)   Wt 193 lb (87.5 kg)   SpO2 96%   BMI 31.15 kg/m   Assessment and Plan:  Problem List Items Addressed This Visit       Unprioritized   Mixed hyperlipidemia   LDL is  Lab Results  Component Value Date   LDLCALC 96 06/26/2023   Current regimen is Zetia .  Atorvastatin  added with recent  TIA event.  Tolerating medications well without issues.       Relevant Medications   ezetimibe  (ZETIA ) 10 MG tablet   Essential hypertension (Chronic)   Controlled BP with normal exam. Current regimen is Coreg  and Entresto . Will continue same medications; encourage continued reduced sodium diet.       Relevant Medications   ezetimibe  (ZETIA ) 10 MG tablet   Intermittent asthma without complication (Chronic)   Stable symptoms - followed by Pulmonary On no daily medications      Melanoma in situ of neck (HCC) (Chronic)   HFrEF (heart failure with reduced ejection fraction) (HCC) (Chronic)   Stable symptoms with controlled BP Followed by Cardiology - on Entresto  and Coreg       Relevant Medications   ezetimibe  (ZETIA ) 10 MG tablet   TIA (transient ischemic attack)   Two weeks ago admitted for workup Started on Atorvastatin  and Plavix  but she does not want to take either due to past ASE. Recommend ASA 81 mg daily Long term monitor in place  Recommend follow up with Cardiology      Relevant  Medications   ezetimibe  (ZETIA ) 10 MG tablet   Other Visit Diagnoses       Annual physical exam    -  Primary     Encounter for screening mammogram for breast cancer       Relevant Orders   MM 3D SCREENING MAMMOGRAM BILATERAL BREAST     NSVT (nonsustained ventricular tachycardia) (HCC)   (Chronic)     Relevant Medications   ezetimibe  (ZETIA ) 10 MG tablet       No follow-ups on file.    Leita HILARIO Adie, MD [facility] [place]

## 2023-07-06 NOTE — Assessment & Plan Note (Signed)
 LDL is  Lab Results  Component Value Date   LDLCALC 96 06/26/2023   Current regimen is Zetia .  Atorvastatin  added with recent TIA event.  Tolerating medications well without issues.

## 2023-07-07 ENCOUNTER — Other Ambulatory Visit (INDEPENDENT_AMBULATORY_CARE_PROVIDER_SITE_OTHER): Payer: Medicare Other | Admitting: Internal Medicine

## 2023-07-07 DIAGNOSIS — I10 Essential (primary) hypertension: Secondary | ICD-10-CM | POA: Diagnosis not present

## 2023-07-07 DIAGNOSIS — Z556 Problems related to health literacy: Secondary | ICD-10-CM

## 2023-07-07 DIAGNOSIS — Z86711 Personal history of pulmonary embolism: Secondary | ICD-10-CM

## 2023-07-07 DIAGNOSIS — R4181 Age-related cognitive decline: Secondary | ICD-10-CM

## 2023-07-07 DIAGNOSIS — I5020 Unspecified systolic (congestive) heart failure: Secondary | ICD-10-CM

## 2023-07-07 DIAGNOSIS — W19XXXD Unspecified fall, subsequent encounter: Secondary | ICD-10-CM

## 2023-07-07 NOTE — Progress Notes (Signed)
 Received home health orders orders from [facility]. Start of care 06/06/23.   Certification and orders from 06/06/23 through 08/04/23 are reviewed, signed and faxed back to home health company.  Need of intermittent skilled services at home: home bound  The home health care plan has been established by me and will be reviewed and updated as needed to maximize patient recovery.  I certify that all home health services have been and will be furnished to the patient while under my care.  Face-to-face encounter in which the need for home health services was established: 06/25/23 at ED visit.  Patient is receiving home health services for the following diagnoses: Problem List Items Addressed This Visit       Unprioritized   Essential hypertension - Primary (Chronic)   HFrEF (heart failure with reduced ejection fraction) (HCC) (Chronic)   Age-related cognitive decline   Personal history of PE (pulmonary embolism)   Cause of injury, accidental fall, subsequent encounter   Problem related to health literacy     Leita Adie, MD

## 2023-07-11 DIAGNOSIS — Z791 Long term (current) use of non-steroidal anti-inflammatories (NSAID): Secondary | ICD-10-CM | POA: Diagnosis not present

## 2023-07-11 DIAGNOSIS — Z86711 Personal history of pulmonary embolism: Secondary | ICD-10-CM | POA: Diagnosis not present

## 2023-07-11 DIAGNOSIS — R4181 Age-related cognitive decline: Secondary | ICD-10-CM | POA: Diagnosis not present

## 2023-07-11 DIAGNOSIS — Z556 Problems related to health literacy: Secondary | ICD-10-CM | POA: Diagnosis not present

## 2023-07-11 DIAGNOSIS — W19XXXD Unspecified fall, subsequent encounter: Secondary | ICD-10-CM | POA: Diagnosis not present

## 2023-07-11 DIAGNOSIS — I509 Heart failure, unspecified: Secondary | ICD-10-CM | POA: Diagnosis not present

## 2023-07-18 DIAGNOSIS — W19XXXD Unspecified fall, subsequent encounter: Secondary | ICD-10-CM | POA: Diagnosis not present

## 2023-07-18 DIAGNOSIS — I509 Heart failure, unspecified: Secondary | ICD-10-CM | POA: Diagnosis not present

## 2023-07-18 DIAGNOSIS — Z556 Problems related to health literacy: Secondary | ICD-10-CM | POA: Diagnosis not present

## 2023-07-18 DIAGNOSIS — Z86711 Personal history of pulmonary embolism: Secondary | ICD-10-CM | POA: Diagnosis not present

## 2023-07-18 DIAGNOSIS — R4181 Age-related cognitive decline: Secondary | ICD-10-CM | POA: Diagnosis not present

## 2023-07-18 DIAGNOSIS — Z791 Long term (current) use of non-steroidal anti-inflammatories (NSAID): Secondary | ICD-10-CM | POA: Diagnosis not present

## 2023-07-26 DIAGNOSIS — R4181 Age-related cognitive decline: Secondary | ICD-10-CM | POA: Diagnosis not present

## 2023-07-26 DIAGNOSIS — Z556 Problems related to health literacy: Secondary | ICD-10-CM | POA: Diagnosis not present

## 2023-07-26 DIAGNOSIS — W19XXXD Unspecified fall, subsequent encounter: Secondary | ICD-10-CM | POA: Diagnosis not present

## 2023-07-26 DIAGNOSIS — Z86711 Personal history of pulmonary embolism: Secondary | ICD-10-CM | POA: Diagnosis not present

## 2023-07-26 DIAGNOSIS — I509 Heart failure, unspecified: Secondary | ICD-10-CM | POA: Diagnosis not present

## 2023-07-26 DIAGNOSIS — Z791 Long term (current) use of non-steroidal anti-inflammatories (NSAID): Secondary | ICD-10-CM | POA: Diagnosis not present

## 2023-07-31 DIAGNOSIS — Z86711 Personal history of pulmonary embolism: Secondary | ICD-10-CM | POA: Diagnosis not present

## 2023-07-31 DIAGNOSIS — R4181 Age-related cognitive decline: Secondary | ICD-10-CM | POA: Diagnosis not present

## 2023-07-31 DIAGNOSIS — I509 Heart failure, unspecified: Secondary | ICD-10-CM | POA: Diagnosis not present

## 2023-07-31 DIAGNOSIS — Z556 Problems related to health literacy: Secondary | ICD-10-CM | POA: Diagnosis not present

## 2023-07-31 DIAGNOSIS — W19XXXD Unspecified fall, subsequent encounter: Secondary | ICD-10-CM | POA: Diagnosis not present

## 2023-07-31 DIAGNOSIS — Z791 Long term (current) use of non-steroidal anti-inflammatories (NSAID): Secondary | ICD-10-CM | POA: Diagnosis not present

## 2023-08-02 DIAGNOSIS — M2041 Other hammer toe(s) (acquired), right foot: Secondary | ICD-10-CM | POA: Diagnosis not present

## 2023-08-02 DIAGNOSIS — R4181 Age-related cognitive decline: Secondary | ICD-10-CM | POA: Diagnosis not present

## 2023-08-02 DIAGNOSIS — Z556 Problems related to health literacy: Secondary | ICD-10-CM | POA: Diagnosis not present

## 2023-08-02 DIAGNOSIS — Z791 Long term (current) use of non-steroidal anti-inflammatories (NSAID): Secondary | ICD-10-CM | POA: Diagnosis not present

## 2023-08-02 DIAGNOSIS — Z86711 Personal history of pulmonary embolism: Secondary | ICD-10-CM | POA: Diagnosis not present

## 2023-08-02 DIAGNOSIS — M79674 Pain in right toe(s): Secondary | ICD-10-CM | POA: Diagnosis not present

## 2023-08-02 DIAGNOSIS — I420 Dilated cardiomyopathy: Secondary | ICD-10-CM | POA: Diagnosis not present

## 2023-08-02 DIAGNOSIS — I509 Heart failure, unspecified: Secondary | ICD-10-CM | POA: Diagnosis not present

## 2023-08-02 DIAGNOSIS — R269 Unspecified abnormalities of gait and mobility: Secondary | ICD-10-CM | POA: Diagnosis not present

## 2023-08-02 DIAGNOSIS — W19XXXD Unspecified fall, subsequent encounter: Secondary | ICD-10-CM | POA: Diagnosis not present

## 2023-08-04 ENCOUNTER — Telehealth: Payer: Self-pay | Admitting: Nurse Practitioner

## 2023-08-04 NOTE — Telephone Encounter (Signed)
 New Message:       Audrey Martinez is calling with abnormal results.

## 2023-08-04 NOTE — Telephone Encounter (Signed)
 I rhythm called and reported that on 07/12/23 at 7:59 AM  the patient had AV Block at 30 bmp that lasted 4 seconds. Will forward to provider.

## 2023-08-05 DIAGNOSIS — Z602 Problems related to living alone: Secondary | ICD-10-CM | POA: Diagnosis not present

## 2023-08-05 DIAGNOSIS — I509 Heart failure, unspecified: Secondary | ICD-10-CM | POA: Diagnosis not present

## 2023-08-05 DIAGNOSIS — R4181 Age-related cognitive decline: Secondary | ICD-10-CM | POA: Diagnosis not present

## 2023-08-05 DIAGNOSIS — Z556 Problems related to health literacy: Secondary | ICD-10-CM | POA: Diagnosis not present

## 2023-08-05 DIAGNOSIS — Z791 Long term (current) use of non-steroidal anti-inflammatories (NSAID): Secondary | ICD-10-CM | POA: Diagnosis not present

## 2023-08-05 DIAGNOSIS — Z9181 History of falling: Secondary | ICD-10-CM | POA: Diagnosis not present

## 2023-08-05 DIAGNOSIS — Z86711 Personal history of pulmonary embolism: Secondary | ICD-10-CM | POA: Diagnosis not present

## 2023-08-05 NOTE — Telephone Encounter (Signed)
 Monitor placed at time of hosp d/c for TIA.  Final read pending but preliminarily shows brief episodes of high grade heart block, and 1701 episodes of SVT w/ known prior h/o SVT and ablation.  Recommend EP eval given tachy/brady noted on monitor.

## 2023-08-07 ENCOUNTER — Other Ambulatory Visit: Payer: Self-pay

## 2023-08-07 DIAGNOSIS — I471 Supraventricular tachycardia, unspecified: Secondary | ICD-10-CM

## 2023-08-07 NOTE — Telephone Encounter (Signed)
 Called patient, advised of the following message below. Advised our scheduling team would reach out to get scheduled with EP. (Referral placed)   Patient verbalized understanding, thankful for call back.

## 2023-08-08 ENCOUNTER — Other Ambulatory Visit (INDEPENDENT_AMBULATORY_CARE_PROVIDER_SITE_OTHER): Admitting: Internal Medicine

## 2023-08-08 DIAGNOSIS — Z602 Problems related to living alone: Secondary | ICD-10-CM | POA: Diagnosis not present

## 2023-08-08 DIAGNOSIS — Z556 Problems related to health literacy: Secondary | ICD-10-CM | POA: Diagnosis not present

## 2023-08-08 DIAGNOSIS — I5020 Unspecified systolic (congestive) heart failure: Secondary | ICD-10-CM | POA: Diagnosis not present

## 2023-08-08 DIAGNOSIS — Z9181 History of falling: Secondary | ICD-10-CM

## 2023-08-08 DIAGNOSIS — Z791 Long term (current) use of non-steroidal anti-inflammatories (NSAID): Secondary | ICD-10-CM | POA: Diagnosis not present

## 2023-08-08 DIAGNOSIS — R4181 Age-related cognitive decline: Secondary | ICD-10-CM | POA: Diagnosis not present

## 2023-08-08 DIAGNOSIS — Z86711 Personal history of pulmonary embolism: Secondary | ICD-10-CM | POA: Diagnosis not present

## 2023-08-08 NOTE — Progress Notes (Signed)
 Received home health orders orders from [facility]. Start of care 06/06/23.   Certification and orders from 08/05/23 through 10/03/23 are reviewed, signed and faxed back to home health company.  Need of intermittent skilled services at home: homebound  The home health care plan has been established by me and will be reviewed and updated as needed to maximize patient recovery.  I certify that all home health services have been and will be furnished to the patient while under my care.  Face-to-face encounter in which the need for home health services was established: 07/05/33  Patient is receiving home health services for the following diagnoses: Problem List Items Addressed This Visit       Unprioritized   HFrEF (heart failure with reduced ejection fraction) (HCC) - Primary (Chronic)   Age-related cognitive decline   Encounter for long-term (current) use of non-steroidal anti-inflammatories   Personal history of PE (pulmonary embolism)   Problem related to health literacy   Personal history of fall   Problem related to living alone     Bari Edward, MD

## 2023-08-09 ENCOUNTER — Telehealth: Payer: Self-pay | Admitting: Internal Medicine

## 2023-08-09 DIAGNOSIS — I509 Heart failure, unspecified: Secondary | ICD-10-CM | POA: Diagnosis not present

## 2023-08-09 DIAGNOSIS — Z556 Problems related to health literacy: Secondary | ICD-10-CM | POA: Diagnosis not present

## 2023-08-09 DIAGNOSIS — Z86711 Personal history of pulmonary embolism: Secondary | ICD-10-CM | POA: Diagnosis not present

## 2023-08-09 DIAGNOSIS — Z602 Problems related to living alone: Secondary | ICD-10-CM | POA: Diagnosis not present

## 2023-08-09 DIAGNOSIS — R4181 Age-related cognitive decline: Secondary | ICD-10-CM | POA: Diagnosis not present

## 2023-08-09 DIAGNOSIS — Z791 Long term (current) use of non-steroidal anti-inflammatories (NSAID): Secondary | ICD-10-CM | POA: Diagnosis not present

## 2023-08-09 NOTE — Telephone Encounter (Signed)
 Copied from CRM [id number]. Topic: Clinical - Medication Question >> Aug 09, 2023  4:31 PM Everette C wrote: Reason for CRM: Nurse Tarsha with [facility] has called to request contact with a member of clinical staff when possible to review and list the patient's active medications  Please contact when possible

## 2023-08-10 DIAGNOSIS — I509 Heart failure, unspecified: Secondary | ICD-10-CM | POA: Diagnosis not present

## 2023-08-10 DIAGNOSIS — R4181 Age-related cognitive decline: Secondary | ICD-10-CM | POA: Diagnosis not present

## 2023-08-10 DIAGNOSIS — Z86711 Personal history of pulmonary embolism: Secondary | ICD-10-CM | POA: Diagnosis not present

## 2023-08-10 DIAGNOSIS — Z556 Problems related to health literacy: Secondary | ICD-10-CM | POA: Diagnosis not present

## 2023-08-10 DIAGNOSIS — Z791 Long term (current) use of non-steroidal anti-inflammatories (NSAID): Secondary | ICD-10-CM | POA: Diagnosis not present

## 2023-08-10 DIAGNOSIS — Z602 Problems related to living alone: Secondary | ICD-10-CM | POA: Diagnosis not present

## 2023-08-10 NOTE — Telephone Encounter (Signed)
 Called Dorinda Hill let her know that Dr. [name] only prescribes one medication which is Zetia. Told her pt see a cardiologist as well. She will reach out to cardiology.  KP

## 2023-08-10 NOTE — Telephone Encounter (Signed)
 She stated pt is not taking her medications like she should. She asked pt if she would like a pill pack. She stated that the pt did not want pill packs.  KP

## 2023-08-13 DIAGNOSIS — G459 Transient cerebral ischemic attack, unspecified: Secondary | ICD-10-CM | POA: Diagnosis not present

## 2023-08-14 ENCOUNTER — Encounter: Payer: Self-pay | Admitting: *Deleted

## 2023-08-15 DIAGNOSIS — Z556 Problems related to health literacy: Secondary | ICD-10-CM | POA: Diagnosis not present

## 2023-08-15 DIAGNOSIS — Z86711 Personal history of pulmonary embolism: Secondary | ICD-10-CM | POA: Diagnosis not present

## 2023-08-15 DIAGNOSIS — Z791 Long term (current) use of non-steroidal anti-inflammatories (NSAID): Secondary | ICD-10-CM | POA: Diagnosis not present

## 2023-08-15 DIAGNOSIS — Z602 Problems related to living alone: Secondary | ICD-10-CM | POA: Diagnosis not present

## 2023-08-15 DIAGNOSIS — R4181 Age-related cognitive decline: Secondary | ICD-10-CM | POA: Diagnosis not present

## 2023-08-15 DIAGNOSIS — I509 Heart failure, unspecified: Secondary | ICD-10-CM | POA: Diagnosis not present

## 2023-08-17 DIAGNOSIS — Z791 Long term (current) use of non-steroidal anti-inflammatories (NSAID): Secondary | ICD-10-CM | POA: Diagnosis not present

## 2023-08-17 DIAGNOSIS — Z602 Problems related to living alone: Secondary | ICD-10-CM | POA: Diagnosis not present

## 2023-08-17 DIAGNOSIS — Z556 Problems related to health literacy: Secondary | ICD-10-CM | POA: Diagnosis not present

## 2023-08-17 DIAGNOSIS — I509 Heart failure, unspecified: Secondary | ICD-10-CM | POA: Diagnosis not present

## 2023-08-17 DIAGNOSIS — R4181 Age-related cognitive decline: Secondary | ICD-10-CM | POA: Diagnosis not present

## 2023-08-17 DIAGNOSIS — Z86711 Personal history of pulmonary embolism: Secondary | ICD-10-CM | POA: Diagnosis not present

## 2023-08-22 NOTE — Progress Notes (Deleted)
  Electrophysiology Office Note:    Date:  08/22/2023   ID:  Lateefa, Crosby [birth year], MRN [id number]  [facility] Cardiologist:  None  [facility] Electrophysiologist:  Lanier Prude, MD   Referring MD: Waynette Buttery*   Chief Complaint: Tachycardia-bradycardia syndrome  History of Present Illness:    Ms. Tramel is a 78 year old woman who I am seeing today for an evaluation of tachycardia-bradycardia syndrome at the request of Christain Sacramento, NP.  The patient last saw Dr. Mariah Milling January 10, 2023 in the clinic.  She has a history of SVT, atrial tachycardia.  She had a history of AVNRT ablation at [facility].  She has a nonischemic cardiomyopathy with an ejection fraction of 30 to 35%.     Their past medical, social and family history was reviewed.   ROS:   Please see the history of present illness.    All other systems reviewed and are negative.  EKGs/Labs/Other Studies Reviewed:    The following studies were reviewed today:  August 13, 2023 ZIO monitor personally reviewed 1700 SVT episodes, longest lasting 4 hours and 50 minutes with an average rate of 106 bpm.  9 episodes of AV block, longest 29 seconds Rare supraventricular and ventricular ectopy  June 27, 2023 EKG shows sinus rhythm, first-degree AV delay  June 26, 2023 echo EF 50-55 RV normal Mild MR Mild AI        Physical Exam:    VS:  There were no vitals taken for this visit.    Wt Readings from Last 3 Encounters:  07/06/23 193 lb (87.5 kg)  06/25/23 197 lb 10.3 oz (89.7 kg)  04/24/23 200 lb (90.7 kg)     GEN: no distress CARD: RRR, No MRG RESP: No IWOB. CTAB.        ASSESSMENT AND PLAN:    No diagnosis found.  #SVT The patient has recurrent episodes of SVT on heart monitor.  Rhythm strip suggestive of atrial tachycardia.  She also had 1 episode of nocturnal AV block.  For now I would continue Coreg.  I discussed red flag symptoms that should prompt ER evaluation  including presyncope and syncope.  #Chronic systolic heart failure NYHA class II.  Warm and dry on exam.  Continue Entresto, Coreg.        Signed, Rossie Muskrat. Lalla Brothers, MD, [facility], [facility] 08/22/2023 10:42 PM    Electrophysiology [facility]

## 2023-08-23 ENCOUNTER — Ambulatory Visit: Attending: Cardiology | Admitting: Cardiology

## 2023-08-23 ENCOUNTER — Encounter: Payer: Self-pay | Admitting: Cardiology

## 2023-08-23 ENCOUNTER — Ambulatory Visit: Admitting: Cardiology

## 2023-08-23 VITALS — BP 116/68 | HR 94 | Ht 62.0 in | Wt 203.0 lb

## 2023-08-23 DIAGNOSIS — I4719 Other supraventricular tachycardia: Secondary | ICD-10-CM | POA: Insufficient documentation

## 2023-08-23 DIAGNOSIS — I5020 Unspecified systolic (congestive) heart failure: Secondary | ICD-10-CM | POA: Insufficient documentation

## 2023-08-23 NOTE — Patient Instructions (Addendum)
 Medication Instructions:  Your physician recommends that you continue on your current medications as directed. Please refer to the Current Medication list given to you today.  *If you need a refill on your cardiac medications before your next appointment, please call your pharmacy*  Follow-Up: At [facility], you and your health needs are our priority.  As part of our continuing mission to provide you with exceptional heart care, we have created designated Provider Care Teams.  These Care Teams include your primary Cardiologist (physician) and Advanced Practice Providers (APPs -  Physician Assistants and Nurse Practitioners) who all work together to provide you with the care you need, when you need it.  Your next appointment:   As needed with Dr. Shon Baton follow up with Dr. Mariah Milling

## 2023-08-23 NOTE — Progress Notes (Addendum)
  Electrophysiology Office Note:    Date:  08/23/2023   ID:  Audrey, Martinez 1945/12/02, MRN [id number]  [facility] Cardiologist:  None  [facility] Electrophysiologist:  Boyce Byes, MD   Referring MD: Ascencion Black*   Chief Complaint: Tachycardia-bradycardia syndrome  History of Present Illness:    [name] is a 78 year old woman who I am seeing today for an evaluation of tachycardia-bradycardia syndrome at the request of Audrey Bicker, NP.  The patient last saw Dr. Jerelene Monday January 10, 2023 in the clinic.  She has a history of SVT, atrial tachycardia.  She had a history of AVNRT ablation at [facility].  She has a nonischemic cardiomyopathy with an ejection fraction of 30 to 35%. Today she tells me she is asymptomatic.  She does not appreciate any arrhythmias.  No lightheadedness or dizziness.    Their past medical, social and family history was reviewed.   ROS:   Please see the history of present illness.    All other systems reviewed and are negative.  EKGs/Labs/Other Studies Reviewed:    The following studies were reviewed today:  August 13, 2023 ZIO monitor personally reviewed 1700 SVT episodes, longest lasting 4 hours and 50 minutes with an average rate of 106 bpm.  9 episodes of AV block, longest 29 seconds.  These episodes appear to be her known atrial tachycardia with variable AV conduction that occurred during the nighttime hours.  QRS complexes are narrow.  I suspect this is vagally mediated. Rare supraventricular and ventricular ectopy  June 27, 2023 EKG shows sinus rhythm, first-degree AV delay  June 26, 2023 echo EF 50-55 RV normal Mild MR Mild AI        Physical Exam:    VS:  BP 116/68   Pulse 94   Ht 5\' 2"  (1.575 m)   Wt 203 lb (92.1 kg)   SpO2 95%   BMI 37.13 kg/m     Wt Readings from Last 3 Encounters:  08/23/23 203 lb (92.1 kg)  07/06/23 193 lb (87.5 kg)  06/25/23 197 lb 10.3 oz (89.7 kg)     GEN: no  distress CARD: RRR, No MRG RESP: No IWOB. CTAB.        ASSESSMENT AND PLAN:    No diagnosis found.  #SVT The patient has recurrent episodes of SVT on heart monitor.  Rhythm strip suggestive of atrial tachycardia which is known for her.  She also had an episode of nocturnal AV block that appears vagally mediated in the setting of her atrial tachycardia.  For now I would continue Coreg .  I discussed red flag symptoms that should prompt ER evaluation including presyncope and syncope.  #Paroxysmal AV block In the setting of her atrial tachycardia and during the asleep hours.  I suspect this is vagally mediated.  QRS is narrow.  No syncope or presyncope.  #Chronic systolic heart failure NYHA class II.  Warm and dry on exam.  Continue Entresto , Coreg .    Follow-up 1 year with Dr. Gollan.  Follow-up with EP on an as-needed basis.    Signed, Leanora Prophet. Marven Slimmer, MD, [facility], [facility] 08/23/2023 2:53 PM    Electrophysiology [facility]  ------------------------  Addendum Oct 01, 2023  #Preoperative risk stratification The patient is at acceptable risk to undergo planned surgery.  Audrey Martinez T. Marven Slimmer, MD, [facility], [facility] Cardiac Electrophysiology

## 2023-08-24 DIAGNOSIS — I509 Heart failure, unspecified: Secondary | ICD-10-CM | POA: Diagnosis not present

## 2023-08-24 DIAGNOSIS — Z602 Problems related to living alone: Secondary | ICD-10-CM | POA: Diagnosis not present

## 2023-08-24 DIAGNOSIS — Z556 Problems related to health literacy: Secondary | ICD-10-CM | POA: Diagnosis not present

## 2023-08-24 DIAGNOSIS — Z791 Long term (current) use of non-steroidal anti-inflammatories (NSAID): Secondary | ICD-10-CM | POA: Diagnosis not present

## 2023-08-24 DIAGNOSIS — R4181 Age-related cognitive decline: Secondary | ICD-10-CM | POA: Diagnosis not present

## 2023-08-24 DIAGNOSIS — Z86711 Personal history of pulmonary embolism: Secondary | ICD-10-CM | POA: Diagnosis not present

## 2023-08-25 ENCOUNTER — Telehealth: Payer: Self-pay | Admitting: Internal Medicine

## 2023-08-25 DIAGNOSIS — Z602 Problems related to living alone: Secondary | ICD-10-CM | POA: Diagnosis not present

## 2023-08-25 DIAGNOSIS — Z791 Long term (current) use of non-steroidal anti-inflammatories (NSAID): Secondary | ICD-10-CM | POA: Diagnosis not present

## 2023-08-25 DIAGNOSIS — Z556 Problems related to health literacy: Secondary | ICD-10-CM | POA: Diagnosis not present

## 2023-08-25 DIAGNOSIS — Z86711 Personal history of pulmonary embolism: Secondary | ICD-10-CM | POA: Diagnosis not present

## 2023-08-25 DIAGNOSIS — R4181 Age-related cognitive decline: Secondary | ICD-10-CM | POA: Diagnosis not present

## 2023-08-25 DIAGNOSIS — I509 Heart failure, unspecified: Secondary | ICD-10-CM | POA: Diagnosis not present

## 2023-08-25 NOTE — Telephone Encounter (Signed)
 Copied from CRM [id number]. Topic: Referral - Request for Referral >> Aug 25, 2023  3:14 PM Higinio Roger wrote: Did the patient discuss referral with their provider in the last year? Yes (If No - schedule appointment) (If Yes - send message)  Appointment offered? No  Type of order/referral and detailed reason for visit: orthopedic for right hip pain  Preference of office, provider, location:  [facility] [address] [place], [place] [address] Phone: [phone] Fax: [phone]  If referral order, have you been seen by this specialty before? No (If Yes, this issue or another issue? When? Where?  Can we respond through MyChart? Yes

## 2023-08-25 NOTE — Telephone Encounter (Signed)
 Please review

## 2023-08-28 ENCOUNTER — Other Ambulatory Visit: Payer: Self-pay

## 2023-08-28 DIAGNOSIS — M7061 Trochanteric bursitis, right hip: Secondary | ICD-10-CM | POA: Diagnosis not present

## 2023-08-28 DIAGNOSIS — M25559 Pain in unspecified hip: Secondary | ICD-10-CM

## 2023-08-28 DIAGNOSIS — M25551 Pain in right hip: Secondary | ICD-10-CM | POA: Diagnosis not present

## 2023-08-28 DIAGNOSIS — I420 Dilated cardiomyopathy: Secondary | ICD-10-CM | POA: Diagnosis not present

## 2023-08-28 DIAGNOSIS — M1611 Unilateral primary osteoarthritis, right hip: Secondary | ICD-10-CM | POA: Diagnosis not present

## 2023-08-28 DIAGNOSIS — M2041 Other hammer toe(s) (acquired), right foot: Secondary | ICD-10-CM | POA: Diagnosis not present

## 2023-08-28 DIAGNOSIS — M79674 Pain in right toe(s): Secondary | ICD-10-CM | POA: Diagnosis not present

## 2023-08-28 NOTE — Telephone Encounter (Signed)
Referral placed.  KP 

## 2023-08-30 DIAGNOSIS — Z86711 Personal history of pulmonary embolism: Secondary | ICD-10-CM | POA: Diagnosis not present

## 2023-08-30 DIAGNOSIS — Z602 Problems related to living alone: Secondary | ICD-10-CM | POA: Diagnosis not present

## 2023-08-30 DIAGNOSIS — R4181 Age-related cognitive decline: Secondary | ICD-10-CM | POA: Diagnosis not present

## 2023-08-30 DIAGNOSIS — Z791 Long term (current) use of non-steroidal anti-inflammatories (NSAID): Secondary | ICD-10-CM | POA: Diagnosis not present

## 2023-08-30 DIAGNOSIS — I509 Heart failure, unspecified: Secondary | ICD-10-CM | POA: Diagnosis not present

## 2023-08-30 DIAGNOSIS — Z556 Problems related to health literacy: Secondary | ICD-10-CM | POA: Diagnosis not present

## 2023-09-04 DIAGNOSIS — I509 Heart failure, unspecified: Secondary | ICD-10-CM | POA: Diagnosis not present

## 2023-09-04 DIAGNOSIS — Z86711 Personal history of pulmonary embolism: Secondary | ICD-10-CM | POA: Diagnosis not present

## 2023-09-04 DIAGNOSIS — Z9181 History of falling: Secondary | ICD-10-CM | POA: Diagnosis not present

## 2023-09-04 DIAGNOSIS — Z556 Problems related to health literacy: Secondary | ICD-10-CM | POA: Diagnosis not present

## 2023-09-04 DIAGNOSIS — Z602 Problems related to living alone: Secondary | ICD-10-CM | POA: Diagnosis not present

## 2023-09-04 DIAGNOSIS — Z791 Long term (current) use of non-steroidal anti-inflammatories (NSAID): Secondary | ICD-10-CM | POA: Diagnosis not present

## 2023-09-04 DIAGNOSIS — R4181 Age-related cognitive decline: Secondary | ICD-10-CM | POA: Diagnosis not present

## 2023-09-07 DIAGNOSIS — Z602 Problems related to living alone: Secondary | ICD-10-CM | POA: Diagnosis not present

## 2023-09-07 DIAGNOSIS — I509 Heart failure, unspecified: Secondary | ICD-10-CM | POA: Diagnosis not present

## 2023-09-07 DIAGNOSIS — Z86711 Personal history of pulmonary embolism: Secondary | ICD-10-CM | POA: Diagnosis not present

## 2023-09-07 DIAGNOSIS — Z556 Problems related to health literacy: Secondary | ICD-10-CM | POA: Diagnosis not present

## 2023-09-07 DIAGNOSIS — R4181 Age-related cognitive decline: Secondary | ICD-10-CM | POA: Diagnosis not present

## 2023-09-07 DIAGNOSIS — Z791 Long term (current) use of non-steroidal anti-inflammatories (NSAID): Secondary | ICD-10-CM | POA: Diagnosis not present

## 2023-09-14 DIAGNOSIS — Z86711 Personal history of pulmonary embolism: Secondary | ICD-10-CM | POA: Diagnosis not present

## 2023-09-14 DIAGNOSIS — Z556 Problems related to health literacy: Secondary | ICD-10-CM | POA: Diagnosis not present

## 2023-09-14 DIAGNOSIS — I509 Heart failure, unspecified: Secondary | ICD-10-CM | POA: Diagnosis not present

## 2023-09-14 DIAGNOSIS — Z602 Problems related to living alone: Secondary | ICD-10-CM | POA: Diagnosis not present

## 2023-09-14 DIAGNOSIS — Z791 Long term (current) use of non-steroidal anti-inflammatories (NSAID): Secondary | ICD-10-CM | POA: Diagnosis not present

## 2023-09-14 DIAGNOSIS — R4181 Age-related cognitive decline: Secondary | ICD-10-CM | POA: Diagnosis not present

## 2023-09-19 DIAGNOSIS — Z602 Problems related to living alone: Secondary | ICD-10-CM | POA: Diagnosis not present

## 2023-09-19 DIAGNOSIS — Z556 Problems related to health literacy: Secondary | ICD-10-CM | POA: Diagnosis not present

## 2023-09-19 DIAGNOSIS — I509 Heart failure, unspecified: Secondary | ICD-10-CM | POA: Diagnosis not present

## 2023-09-19 DIAGNOSIS — Z86711 Personal history of pulmonary embolism: Secondary | ICD-10-CM | POA: Diagnosis not present

## 2023-09-19 DIAGNOSIS — Z791 Long term (current) use of non-steroidal anti-inflammatories (NSAID): Secondary | ICD-10-CM | POA: Diagnosis not present

## 2023-09-19 DIAGNOSIS — R4181 Age-related cognitive decline: Secondary | ICD-10-CM | POA: Diagnosis not present

## 2023-09-22 ENCOUNTER — Other Ambulatory Visit: Payer: Self-pay | Admitting: Cardiovascular Disease

## 2023-09-22 DIAGNOSIS — M1611 Unilateral primary osteoarthritis, right hip: Secondary | ICD-10-CM | POA: Diagnosis not present

## 2023-09-25 ENCOUNTER — Telehealth (HOSPITAL_BASED_OUTPATIENT_CLINIC_OR_DEPARTMENT_OTHER): Payer: Self-pay | Admitting: *Deleted

## 2023-09-25 NOTE — Telephone Encounter (Signed)
   Pre-operative Risk Assessment    Patient Name: Audrey Martinez  DOB: 1945-07-20 MRN: [id number]   Date of Martinez office visit: 08/23/2023 Date of next office visit: None  Request for Surgical Clearance    Procedure:   Right THA  Date of Surgery:  Clearance TBD                                 Surgeon:  Dr. Osa Blase Group or Practice Name:  [facility]  Phone number:  [phone] Fax number:  [phone]   Type of Clearance Requested:   - Medical    Type of Anesthesia:  Not Indicated   Additional requests/questions:    Signed, Lauris Port   09/25/2023, 1:08 PM

## 2023-09-25 NOTE — Telephone Encounter (Signed)
 Dr. Marven Slimmer,  Audrey Martinez. Naval 78 year old female is requesting preoperative cardiac evaluation for right total hip arthroplasty.  Procedure has not yet been scheduled.  She was seen by you in clinic on 08/23/2023.  She was referred for recurrent episodes of SVT on heart rate monitor.  She remained stable from a cardiac standpoint.  She denied lightheadedness and dizziness.  Would you be able to comment on cardiac risk for upcoming surgery?  Thank you for your help.  Please direct your response to CV DIV preop pool.  Chet Cota. Pernell Lenoir NP-C     09/25/2023, 2:56 PM [facility] [address] Office [phone] Fax [phone]

## 2023-09-26 ENCOUNTER — Other Ambulatory Visit: Payer: Self-pay | Admitting: Orthopedic Surgery

## 2023-10-02 DIAGNOSIS — Z791 Long term (current) use of non-steroidal anti-inflammatories (NSAID): Secondary | ICD-10-CM | POA: Diagnosis not present

## 2023-10-02 DIAGNOSIS — Z86711 Personal history of pulmonary embolism: Secondary | ICD-10-CM | POA: Diagnosis not present

## 2023-10-02 DIAGNOSIS — I509 Heart failure, unspecified: Secondary | ICD-10-CM | POA: Diagnosis not present

## 2023-10-02 DIAGNOSIS — R4181 Age-related cognitive decline: Secondary | ICD-10-CM | POA: Diagnosis not present

## 2023-10-02 DIAGNOSIS — Z556 Problems related to health literacy: Secondary | ICD-10-CM | POA: Diagnosis not present

## 2023-10-02 DIAGNOSIS — Z602 Problems related to living alone: Secondary | ICD-10-CM | POA: Diagnosis not present

## 2023-10-03 ENCOUNTER — Encounter
Admission: RE | Admit: 2023-10-03 | Discharge: 2023-10-03 | Disposition: A | Discharge status: Home / Self Care | Source: Ambulatory Visit | Attending: Orthopedic Surgery | Admitting: Orthopedic Surgery

## 2023-10-03 ENCOUNTER — Other Ambulatory Visit: Payer: Self-pay

## 2023-10-03 VITALS — BP 118/75 | HR 83 | Resp 16 | Ht 62.0 in | Wt 190.7 lb

## 2023-10-03 DIAGNOSIS — I440 Atrioventricular block, first degree: Secondary | ICD-10-CM | POA: Insufficient documentation

## 2023-10-03 DIAGNOSIS — Z01812 Encounter for preprocedural laboratory examination: Secondary | ICD-10-CM

## 2023-10-03 DIAGNOSIS — Z01810 Encounter for preprocedural cardiovascular examination: Secondary | ICD-10-CM | POA: Diagnosis not present

## 2023-10-03 DIAGNOSIS — Z01818 Encounter for other preprocedural examination: Secondary | ICD-10-CM | POA: Diagnosis not present

## 2023-10-03 HISTORY — DX: Unilateral primary osteoarthritis, right hip: M16.11

## 2023-10-03 LAB — COMPREHENSIVE METABOLIC PANEL WITH GFR
ALT: 12 U/L (ref 0–44)
AST: 16 U/L (ref 15–41)
Albumin: 4 g/dL (ref 3.5–5.0)
Alkaline Phosphatase: 50 U/L (ref 38–126)
Anion gap: 8 (ref 5–15)
BUN: 20 mg/dL (ref 8–23)
CO2: 28 mmol/L (ref 22–32)
Calcium: 9.7 mg/dL (ref 8.9–10.3)
Chloride: 101 mmol/L (ref 98–111)
Creatinine, Ser: 1.23 mg/dL — ABNORMAL HIGH (ref 0.44–1.00)
GFR, Estimated: 46 mL/min — ABNORMAL LOW (ref >60)
Glucose, Bld: 92 mg/dL (ref 70–99)
Potassium: 4.4 mmol/L (ref 3.5–5.1)
Sodium: 137 mmol/L (ref 135–145)
Total Bilirubin: 0.9 mg/dL (ref 0.0–1.2)
Total Protein: 6.4 g/dL — ABNORMAL LOW (ref 6.5–8.1)

## 2023-10-03 LAB — CBC WITH DIFFERENTIAL/PLATELET
Abs Immature Granulocytes: 0.02 10*3/uL (ref 0.00–0.07)
Basophils Absolute: 0 10*3/uL (ref 0.0–0.1)
Basophils Relative: 0 %
Eosinophils Absolute: 0.2 10*3/uL (ref 0.0–0.5)
Eosinophils Relative: 2 %
HCT: 46.2 % — ABNORMAL HIGH (ref 36.0–46.0)
Hemoglobin: 15.3 g/dL — ABNORMAL HIGH (ref 12.0–15.0)
Immature Granulocytes: 0 %
Lymphocytes Relative: 27 %
Lymphs Abs: 1.9 10*3/uL (ref 0.7–4.0)
MCH: 30.7 pg (ref 26.0–34.0)
MCHC: 33.1 g/dL (ref 30.0–36.0)
MCV: 92.8 fL (ref 80.0–100.0)
Monocytes Absolute: 0.6 10*3/uL (ref 0.1–1.0)
Monocytes Relative: 9 %
Neutro Abs: 4.3 10*3/uL (ref 1.7–7.7)
Neutrophils Relative %: 62 %
Platelets: 240 10*3/uL (ref 150–400)
RBC: 4.98 MIL/uL (ref 3.87–5.11)
RDW: 11.9 % (ref 11.5–15.5)
WBC: 6.9 10*3/uL (ref 4.0–10.5)
nRBC: 0 % (ref 0.0–0.2)

## 2023-10-03 LAB — URINALYSIS, ROUTINE W REFLEX MICROSCOPIC
Bacteria, UA: NONE SEEN
Bilirubin Urine: NEGATIVE
Glucose, UA: 150 mg/dL — AB
Hgb urine dipstick: NEGATIVE
Ketones, ur: NEGATIVE mg/dL
Nitrite: NEGATIVE
Protein, ur: NEGATIVE mg/dL
Specific Gravity, Urine: 1.019 (ref 1.005–1.030)
pH: 7 (ref 5.0–8.0)

## 2023-10-03 LAB — SURGICAL PCR SCREEN
MRSA, PCR: NEGATIVE
Staphylococcus aureus: NEGATIVE

## 2023-10-03 NOTE — Patient Instructions (Addendum)
 Your procedure is scheduled on: 10/09/23 - Monday Report to the Registration Desk on the 1st floor of the [facility]. To find out your arrival time, please call [phone] between 1PM - 3PM on: 10/06/23 - Friday If your arrival time is 6:00 am, do not arrive before that time as the Medical Mall entrance doors do not open until 6:00 am.  REMEMBER: Instructions that are not followed completely may result in serious medical risk, up to and including death; or upon the discretion of your surgeon and anesthesiologist your surgery may need to be rescheduled.  Do not eat food after midnight the night before surgery.  No gum chewing or hard candies.  You may however, drink CLEAR liquids up to 2 hours before you are scheduled to arrive for your surgery. Do not drink anything within 2 hours of your scheduled arrival time.  Clear liquids include: - water   - apple juice without pulp - gatorade (not RED colors) - black coffee or tea (Do NOT add milk or creamers to the coffee or tea) Do NOT drink anything that is not on this list.  In addition, your doctor has ordered for you to drink the provided:  Ensure Pre-Surgery Clear Carbohydrate Drink  Drinking this carbohydrate drink up to two hours before surgery helps to reduce insulin resistance and improve patient outcomes. Please complete drinking 2 hours before scheduled arrival time.  One week prior to surgery: Stop Anti-inflammatories (NSAIDS) such as Advil, Aleve, Ibuprofen, Motrin, Naproxen, Naprosyn and Aspirin based products such as Excedrin, Goody's Powder, BC Powder. You may take Tylenol  if needed for pain up until the day of surgery.  Stop ANY OVER THE COUNTER supplements until after surgery.   ON THE DAY OF SURGERY ONLY TAKE THESE MEDICATIONS WITH SIPS OF WATER :  carvedilol  (COREG )  ezetimibe  (ZETIA )  gabapentin  (NEURONTIN )    No Alcohol for 24 hours before or after surgery.  No Smoking including e-cigarettes for 24 hours  before surgery.  No chewable tobacco products for at least 6 hours before surgery.  No nicotine patches on the day of surgery.  Do not use any "recreational" drugs for at least a week (preferably 2 weeks) before your surgery.  Please be advised that the combination of cocaine and anesthesia may have negative outcomes, up to and including death. If you test positive for cocaine, your surgery will be cancelled.  On the morning of surgery brush your teeth with toothpaste and water , you may rinse your mouth with mouthwash if you wish. Do not swallow any toothpaste or mouthwash.  Use CHG Soap or wipes as directed on instruction sheet.  Do not wear jewelry, make-up, hairpins, clips or nail polish.  For welded (permanent) jewelry: bracelets, anklets, waist bands, etc.  Please have this removed prior to surgery.  If it is not removed, there is a chance that hospital personnel will need to cut it off on the day of surgery.  Contact lenses, hearing aids and dentures may not be worn into surgery.  Do not bring valuables to the hospital. [facility] is not responsible for any missing/lost belongings or valuables.   Notify your doctor if there is any change in your medical condition (cold, fever, infection).  Wear comfortable clothing (specific to your surgery type) to the hospital.  After surgery, you can help prevent lung complications by doing breathing exercises.  Take deep breaths and cough every 1-2 hours. Your doctor may order a device called an Incentive Spirometer to help you take  deep breaths.  When coughing or sneezing, hold a pillow firmly against your incision with both hands. This is called "splinting." Doing this helps protect your incision. It also decreases belly discomfort.  If you are being admitted to the hospital overnight, leave your suitcase in the car. After surgery it may be brought to your room.  In case of increased patient census, it may be necessary for you, the  patient, to continue your postoperative care in the Same Day Surgery department.  If you are being discharged the day of surgery, you will not be allowed to drive home. You will need a responsible individual to drive you home and stay with you for 24 hours after surgery.   If you are taking public transportation, you will need to have a responsible individual with you.  Please call the Pre-admissions Testing Dept. at [phone] if you have any questions about these instructions.  Surgery Visitation Policy:  Patients having surgery or a procedure may have two visitors.  Children under the age of 61 must have an adult with them who is not the patient.  Inpatient Visitation:    Visiting hours are 7 a.m. to 8 p.m. Up to four visitors are allowed at one time in a patient room. The visitors may rotate out with other people during the day.  One visitor age 67 or older may stay with the patient overnight and must be in the room by 8 p.m.    Pre-operative 5 CHG Bath Instructions   You can play a key role in reducing the risk of infection after surgery. Your skin needs to be as free of germs as possible. You can reduce the number of germs on your skin by washing with CHG (chlorhexidine gluconate) soap before surgery. CHG is an antiseptic soap that kills germs and continues to kill germs even after washing.   DO NOT use if you have an allergy to chlorhexidine/CHG or antibacterial soaps. If your skin becomes reddened or irritated, stop using the CHG and notify one of our RNs at [phone].   Please shower with the CHG soap starting 4 days before surgery using the following schedule:  05/08 - 05/12.  Please keep in mind the following:  DO NOT shave, including legs and underarms, starting the day of your first shower.   You may shave your face at any point before/day of surgery.  Place clean sheets on your bed the day you start using CHG soap. Use a clean washcloth (not used since being  washed) for each shower. DO NOT sleep with pets once you start using the CHG.   CHG Shower Instructions:  If you choose to wash your hair and private area, wash first with your normal shampoo/soap.  After you use shampoo/soap, rinse your hair and body thoroughly to remove shampoo/soap residue.  Turn the water  OFF and apply about 3 tablespoons (45 ml) of CHG soap to a CLEAN washcloth.  Apply CHG soap ONLY FROM YOUR NECK DOWN TO YOUR TOES (washing for 3-5 minutes)  DO NOT use CHG soap on face, private areas, open wounds, or sores.  Pay special attention to the area where your surgery is being performed.  If you are having back surgery, having someone wash your back for you may be helpful. Wait 2 minutes after CHG soap is applied, then you may rinse off the CHG soap.  Pat dry with a clean towel  Put on clean clothes/pajamas   If you choose to wear lotion,  please use ONLY the CHG-compatible lotions on the back of this paper.     Additional instructions for the day of surgery: DO NOT APPLY any lotions, deodorants, cologne, or perfumes.   Put on clean/comfortable clothes.  Brush your teeth.  Ask your nurse before applying any prescription medications to the skin.      CHG Compatible Lotions   Aveeno Moisturizing lotion  Cetaphil Moisturizing Cream  Cetaphil Moisturizing Lotion  Clairol Herbal Essence Moisturizing Lotion, Dry Skin  Clairol Herbal Essence Moisturizing Lotion, Extra Dry Skin  Clairol Herbal Essence Moisturizing Lotion, Normal Skin  Curel Age Defying Therapeutic Moisturizing Lotion with Alpha Hydroxy  Curel Extreme Care Body Lotion  Curel Soothing Hands Moisturizing Hand Lotion  Curel Therapeutic Moisturizing Cream, Fragrance-Free  Curel Therapeutic Moisturizing Lotion, Fragrance-Free  Curel Therapeutic Moisturizing Lotion, Original Formula  Eucerin Daily Replenishing Lotion  Eucerin Dry Skin Therapy Plus Alpha Hydroxy Crme  Eucerin Dry Skin Therapy Plus Alpha  Hydroxy Lotion  Eucerin Original Crme  Eucerin Original Lotion  Eucerin Plus Crme Eucerin Plus Lotion  Eucerin TriLipid Replenishing Lotion  Keri Anti-Bacterial Hand Lotion  Keri Deep Conditioning Original Lotion Dry Skin Formula Softly Scented  Keri Deep Conditioning Original Lotion, Fragrance Free Sensitive Skin Formula  Keri Lotion Fast Absorbing Fragrance Free Sensitive Skin Formula  Keri Lotion Fast Absorbing Softly Scented Dry Skin Formula  Keri Original Lotion  Keri Skin Renewal Lotion Keri Silky Smooth Lotion  Keri Silky Smooth Sensitive Skin Lotion  Nivea Body Creamy Conditioning Oil  Nivea Body Extra Enriched Lotion  Nivea Body Original Lotion  Nivea Body Sheer Moisturizing Lotion Nivea Crme  Nivea Skin Firming Lotion  NutraDerm 30 Skin Lotion  NutraDerm Skin Lotion  NutraDerm Therapeutic Skin Cream  NutraDerm Therapeutic Skin Lotion  ProShield Protective Hand Cream  Provon moisturizing lotion  How to Use an Incentive Spirometer  An incentive spirometer is a tool that measures how well you are filling your lungs with each breath. Learning to take long, deep breaths using this tool can help you keep your lungs clear and active. This may help to reverse or lessen your chance of developing breathing (pulmonary) problems, especially infection. You may be asked to use a spirometer: After a surgery. If you have a lung problem or a history of smoking. After a long period of time when you have been unable to move or be active. If the spirometer includes an indicator to show the highest number that you have reached, your health care provider or respiratory therapist will help you set a goal. Keep a log of your progress as told by your health care provider. What are the risks? Breathing too quickly may cause dizziness or cause you to pass out. Take your time so you do not get dizzy or light-headed. If you are in pain, you may need to take pain medicine before doing incentive  spirometry. It is harder to take a deep breath if you are having pain. How to use your incentive spirometer  Sit up on the edge of your bed or on a chair. Hold the incentive spirometer so that it is in an upright position. Before you use the spirometer, breathe out normally. Place the mouthpiece in your mouth. Make sure your lips are closed tightly around it. Breathe in slowly and as deeply as you can through your mouth, causing the piston or the ball to rise toward the top of the chamber. Hold your breath for 3-5 seconds, or for as long as possible. If  the spirometer includes a coach indicator, use this to guide you in breathing. Slow down your breathing if the indicator goes above the marked areas. Remove the mouthpiece from your mouth and breathe out normally. The piston or ball will return to the bottom of the chamber. Rest for a few seconds, then repeat the steps 10 or more times. Take your time and take a few normal breaths between deep breaths so that you do not get dizzy or light-headed. Do this every 1-2 hours when you are awake. If the spirometer includes a goal marker to show the highest number you have reached (best effort), use this as a goal to work toward during each repetition. After each set of 10 deep breaths, cough a few times. This will help to make sure that your lungs are clear. If you have an incision on your chest or abdomen from surgery, place a pillow or a rolled-up towel firmly against the incision when you cough. This can help to reduce pain while taking deep breaths and coughing. General tips When you are able to get out of bed: Walk around often. Continue to take deep breaths and cough in order to clear your lungs. Keep using the incentive spirometer until your health care provider says it is okay to stop using it. If you have been in the hospital, you may be told to keep using the spirometer at home. Contact a health care provider if: You are having difficulty  using the spirometer. You have trouble using the spirometer as often as instructed. Your pain medicine is not giving enough relief for you to use the spirometer as told. You have a fever. Get help right away if: You develop shortness of breath. You develop a cough with bloody mucus from the lungs. You have fluid or blood coming from an incision site after you cough. Summary An incentive spirometer is a tool that can help you learn to take long, deep breaths to keep your lungs clear and active. You may be asked to use a spirometer after a surgery, if you have a lung problem or a history of smoking, or if you have been inactive for a long period of time. Use your incentive spirometer as instructed every 1-2 hours while you are awake. If you have an incision on your chest or abdomen, place a pillow or a rolled-up towel firmly against your incision when you cough. This will help to reduce pain. Get help right away if you have shortness of breath, you cough up bloody mucus, or blood comes from your incision when you cough. This information is not intended to replace advice given to you by your health care provider. Make sure you discuss any questions you have with your health care provider. Document Revised: 08/05/2019 Document Reviewed: 08/05/2019 Elsevier Patient Education  2023 Elsevier Inc.  Preoperative Educational Videos for Total Hip, Knee and Shoulder Replacements  To better prepare for surgery, please view our videos that explain the physical activity and discharge planning required to have the best surgical recovery at [facility].  [email or web address]  Questions? Call [phone] or email [email or web address]

## 2023-10-04 DIAGNOSIS — Z791 Long term (current) use of non-steroidal anti-inflammatories (NSAID): Secondary | ICD-10-CM | POA: Diagnosis not present

## 2023-10-04 DIAGNOSIS — I509 Heart failure, unspecified: Secondary | ICD-10-CM | POA: Diagnosis not present

## 2023-10-04 DIAGNOSIS — R4181 Age-related cognitive decline: Secondary | ICD-10-CM | POA: Diagnosis not present

## 2023-10-04 DIAGNOSIS — Z556 Problems related to health literacy: Secondary | ICD-10-CM | POA: Diagnosis not present

## 2023-10-04 DIAGNOSIS — Z86711 Personal history of pulmonary embolism: Secondary | ICD-10-CM | POA: Diagnosis not present

## 2023-10-04 DIAGNOSIS — Z602 Problems related to living alone: Secondary | ICD-10-CM | POA: Diagnosis not present

## 2023-10-04 DIAGNOSIS — Z9181 History of falling: Secondary | ICD-10-CM | POA: Diagnosis not present

## 2023-10-05 ENCOUNTER — Other Ambulatory Visit (INDEPENDENT_AMBULATORY_CARE_PROVIDER_SITE_OTHER): Admitting: Internal Medicine

## 2023-10-05 DIAGNOSIS — Z602 Problems related to living alone: Secondary | ICD-10-CM

## 2023-10-05 DIAGNOSIS — Z556 Problems related to health literacy: Secondary | ICD-10-CM

## 2023-10-05 DIAGNOSIS — Z791 Long term (current) use of non-steroidal anti-inflammatories (NSAID): Secondary | ICD-10-CM

## 2023-10-05 DIAGNOSIS — I509 Heart failure, unspecified: Secondary | ICD-10-CM | POA: Diagnosis not present

## 2023-10-05 DIAGNOSIS — Z86711 Personal history of pulmonary embolism: Secondary | ICD-10-CM | POA: Diagnosis not present

## 2023-10-05 DIAGNOSIS — I420 Dilated cardiomyopathy: Secondary | ICD-10-CM

## 2023-10-05 DIAGNOSIS — R4181 Age-related cognitive decline: Secondary | ICD-10-CM

## 2023-10-05 DIAGNOSIS — Z9181 History of falling: Secondary | ICD-10-CM

## 2023-10-05 LAB — TYPE AND SCREEN
ABO/RH(D): O POS
Antibody Screen: NEGATIVE

## 2023-10-05 NOTE — Progress Notes (Signed)
 Received home health orders orders from [facility]. Start of care 06/06/23.   Certification and orders from 10/04/23 through 12/02/23 are reviewed, signed and faxed back to home health company.  Need of intermittent skilled services at home: homebound  The home health care plan has been established by me and will be reviewed and updated as needed to maximize patient recovery.  I certify that all home health services have been and will be furnished to the patient while under my care.  Face-to-face encounter in which the need for home health services was established: at hospital discharge 06/25/23.  Patient is receiving home health services for the following diagnoses: Problem List Items Addressed This Visit       Unprioritized   Dilated cardiomyopathy (HCC) (Chronic)   Age-related cognitive decline - Primary   Encounter for long-term (current) use of non-steroidal anti-inflammatories   Personal history of PE (pulmonary embolism)   Problem related to health literacy   Personal history of fall   Problem related to living alone     Janna Melter, MD

## 2023-10-06 ENCOUNTER — Encounter: Payer: Self-pay | Admitting: Orthopedic Surgery

## 2023-10-06 NOTE — Telephone Encounter (Signed)
 Dr. Marven Slimmer provided an addendum to his office note dated 08/23/2023 (at the bottom of note) stating patient is an acceptable risk for surgery. I have forwarded the note to the requesting office.   I will remove this request from the pre-op pool.   Lonell Rives. Brooklee Michelin, DNP, NP-C  10/06/2023, 8:40 AM [facility] [address] Office [phone] Fax [phone]

## 2023-10-06 NOTE — Progress Notes (Signed)
 Perioperative / Anesthesia Services  Pre-Admission Testing Clinical Review / Pre-Operative Anesthesia Consult  Date: 10/06/23  PATIENT DEMOGRAPHICS: Name: Audrey Martinez DOB: [birth year] MRN:   [id number]  Note: Available PAT nursing documentation and vital signs have been reviewed. Clinical nursing staff has updated patient's PMH/PSHx, current medication list, and drug allergies/intolerances to ensure complete and comprehensive history available to assist care teams in MDM as it pertains to the aforementioned surgical procedure and anticipated anesthetic course. Extensive review of available clinical information personally performed. [place] PMH and PSHx updated with any diagnoses/procedures that  may have been inadvertently omitted during his intake with the pre-admission testing department's nursing staff.  PLANNED SURGICAL PROCEDURE(S):    Case: [id number] Date/Time: 10/09/23 0958   Procedure: ARTHROPLASTY, HIP, TOTAL, ANTERIOR APPROACH (Right: Hip)   Anesthesia type: Choice   Diagnosis: Primary osteoarthritis of right hip [M16.11]   Pre-op diagnosis: Primary osteoarthritis of right hip M16.11   Location: [facility] OR ROOM 02 / [facility] ORS FOR ANESTHESIA GROUP   Surgeons: Venus Ginsberg, MD     CLINICAL DISCUSSION: Audrey Martinez is a 78 y.o. female who is submitted for pre-surgical anesthesia review and clearance prior to her undergoing the above procedure. Patient has never been a smoker in the past. Pertinent PMH includes: CAD, NICM, HFrEF, AVNRT, paroxysmal Mobitz 1 second-degree AV block, PSVT, NSVT, ILBBB, PE, CVA/TIA, aortic atherosclerosis, HLD, dyspnea, asthma, hypoxic respiratory failure (in setting of SARS-CoV-2), GERD (no daily Tx), lumbar DDD with spinal stenosis (s/p L2 L4 hemilaminectomy), cervical DDD.  Patient is followed by cardiology (Gollan, MD). She was last seen in the cardiology clinic on 08/23/2023; notes reviewed. At the time of her clinic visit, patient  doing well overall from a cardiovascular perspective. Patient denied any chest pain, shortness of breath, PND, orthopnea, palpitations, significant peripheral edema, weakness, fatigue, vertiginous symptoms, or presyncope/syncope. Patient with a past medical history significant for cardiovascular diagnoses. Documented physical exam was grossly benign, providing no evidence of acute exacerbation and/or decompensation of the patient's known cardiovascular conditions.  Of note, complete records regarding patient's cardiovascular history unavailable for review at time of consult.  Patient has received care in the [facility] [place] .  Information gathered from patient report and from notes provided by her local specialty care providers.  Patient with a history of AVNRT.  She underwent radiofrequency catheter ablation at [facility]  back in 2008.  Most recent myocardial perfusion imaging study was performed on 05/31/2018 revealing a mildly reduced left ventricular systolic function with an EF of 41%.  There were no regional wall motion abnormalities.  No left ventricular cavity size enlargement appreciated on review of imaging. SPECT images demonstrated n a moderate size predominantly fixed perfusion defect of mild to moderate intensity in the mid to distal anterior and anteroseptal wall.  Unable to exclude breast attenuation artifact.  TID ratio = 1.08. Study determined to be low to moderate risk.  Patient underwent diagnostic LEFT heart catheterization on 05/02/2019.  Study revealed a severely reduced left ventricular systolic function with an EF of 25-35%.  LVEDP moderately elevated at 17-20 mmHg.  Coronary anatomy normal with no evidence of obstructive coronary artery disease.  There was global hypokinesis.  Findings consistent with a nonischemic cardiomyopathy.  Cardiology unable to exclude hypertensive etiology.  Most recent TTE performed on 06/26/2023 revealed a normal left  ventricular systolic function with an EF of 50-55%. There was mild LVH.  There were no regional wall motion abnormalities. Left ventricular diastolic Doppler parameters consistent  with abnormal relaxation (G1DD). Right ventricular size and function normal.aortic valve was mildly sclerotic.  There was mild mitral and aortic valve regurgitation. All transvalvular gradients were noted to be normal providing no evidence suggestive of valvular stenosis. Aorta normal in size with no evidence of ectasia or aneurysmal dilatation.  Saline contrast bubble study was negative with no evidence of interatrial shunting.  Long-term cardiac event monitor study performed on 08/08/2023 revealing a underlying normal sinus rhythm at an average rate of 93 bpm; range 30-171 bpm.  There was a bundle branch block/IVCD present.  1701 SVT/atrial tachycardia runs occurred with the fastest lasting 3429 beats at a maximum rate of 171 bpm, and the longest lasting 4 hours and 49 minutes with an average rate of 106 bpm.  9 episodes of second-degree/high-grade AV block noted lasting a total of 29 seconds in the setting of atrial tachycardia with marked ventricular bradycardia with variable conduction.  Atrial and ventricular ectopy was rare.  There were no patient triggered events.  NICM with resulting HFrEF being managed with GDMT regimen using beta-blocker (carvedilol ), diuretic spironolactone ), and ARB/ARNI (Entresto ).  Blood pressure documented at 116/68 mmHg on the aforementioned interventions.  Patient is on ezetimibe  for her HLD diagnosis and ASCVD prevention.  She is not diabetic.  Patient does not have an OSAH diagnosis. Patient is able to complete all of her  ADL/IADLs without cardiovascular limitation.  Per the DASI, patient is able to achieve at least 4 METS of physical activity without experiencing any significant degree of angina/anginal equivalent symptoms. No changes were made to her medication regimen during her visit with  cardiology.  Patient scheduled to follow-up with outpatient cardiology and electrophysiology in 1 year or sooner if needed.  Audrey Martinez is scheduled for an elective ARTHROPLASTY, HIP, TOTAL, ANTERIOR APPROACH (Right: Hip) on 10/09/2023 with Dr. Venus Ginsberg, MD.  Given patient's past medical history significant for cardiovascular diagnoses, presurgical cardiac clearance was sought by the PAT team. Per cardiology, "based ACC/AHA guidelines, the patient's past medical history, and the amount of time since her last clinic visit, this patient would be at an overall ACCEPTABLE risk for the planned procedure without further cardiovascular testing or intervention at this time".   In review of her medication reconciliation, the patient is not noted to be taking any type of anticoagulation or antiplatelet therapies that would need to be held during her perioperative course.  Patient denies previous perioperative complications with anesthesia in the past. In review her EMR, it is noted that patient underwent a general anesthetic course here at [facility] ([facility]) in 04/2021 without documented complications.   MOST RECENT VITAL SIGNS:    10/03/2023    2:00 PM 08/23/2023    2:30 PM 07/06/2023    8:25 AM  Vitals with BMI  Height 5\' 2"  5\' 2"  5\' 6"   Weight 190 lbs 11 oz 203 lbs 193 lbs  BMI 34.87 37.12 31.17  Systolic 118 116 413  Diastolic 75 68 68  Pulse 83 94 131   PROVIDERS/SPECIALISTS: NOTE: Primary physician provider listed below. Patient may have been seen by APP or partner within same practice.  PROVIDER ROLE / SPECIALTY LAST Florian Hurt, MD Orthopedics (Surgeon) 09/22/2023  Sheron Dixons, MD Primary Care Provider 08/08/2023  Belva Boyden, MD Cardiology 08/15/2022  Harvie Liner, MD Electrophysiology 08/22/2024   ALLERGIES: Allergies  Allergen Reactions   Amoxicillin Hives, Shortness Of Breath and Swelling    Did it involve  swelling of the face/tongue/throat, SOB, or low BP? Yes Did it involve sudden or severe rash/hives, skin peeling, or any reaction on the inside of your mouth or nose? Yes Did you need to seek medical attention at a hospital or doctor's office? Yes When did it last happen? 1998      If all above answers are "NO", may proceed with cephalosporin use.    Aspirin Anaphylaxis and Shortness Of Breath   Bacitracin-Polymyx-Neo-Hc [Bacitra-Neomycin-Polymyxin-Hc] Rash   Ciprofloxacin Rash   Ciprofloxacin-Dexamethasone  Anaphylaxis   Crestor  [Rosuvastatin ] Other (See Comments)    Caused problems with memory.   Erythromycin Hives and Rash   Hydrochlorothiazide Other (See Comments) and Palpitations    Raises her blood pressure.    Ibuprofen Other (See Comments)    Sharp stomach pains   Morphine And Codeine Nausea And Vomiting   Morpholine Salicylate Nausea Only   Omeprazole Palpitations and Other (See Comments)    Tachycardia and same sensation as her SVTs per patient   Doxycycline Hyclate Hives   Keflex [Cephalexin] Hives   Lovastatin Other (See Comments)    "Unable to function"   Simvastatin Other (See Comments)    "Unable to function"   Silicone Dermatitis and Hives   Neomycin-Bacitracin Zn-Polymyx Hives and Rash   Tape Hives and Rash   CURRENT HOME MEDICATIONS: No current facility-administered medications for this encounter.    B Complex-C (B-COMPLEX WITH VITAMIN C) tablet   calcium  carbonate (OS-CAL - DOSED IN MG OF ELEMENTAL CALCIUM ) 1250 (500 Ca) MG tablet   carvedilol  (COREG ) 6.25 MG tablet   cholecalciferol  (VITAMIN D ) 25 MCG (1000 UT) tablet   Cranberry 1000 MG CAPS   ezetimibe  (ZETIA ) 10 MG tablet   fluticasone  (FLOVENT  HFA) 110 MCG/ACT inhaler   gabapentin  (NEURONTIN ) 300 MG capsule   HYDROcodone -acetaminophen  (NORCO/VICODIN) 5-325 MG tablet   loratadine (CLARITIN) 10 MG tablet   Multiple Vitamins-Minerals (MULTIVITAMIN WITH MINERALS) tablet   sacubitril-valsartan (ENTRESTO )  24-26 MG   spironolactone  (ALDACTONE ) 25 MG tablet   tiZANidine (ZANAFLEX) 2 MG tablet   Turmeric 500 MG CAPS   zinc  sulfate 220 (50 Zn) MG capsule   HISTORY: Past Medical History:  Diagnosis Date   Actinic keratosis    Acute renal failure (ARF) (HCC) 01/31/2020   Acute respiratory failure with hypoxia (HCC) 01/29/2020   Amaurosis fugax, right eye 12/2018   Arthritis    Asthma    Atrial tachycardia (HCC)    a.) s/p RFCA for AVNRT in 2008 at [facility] cell carcinoma 07/2016   central forehead (tx in [place] )   Bilateral pulmonary embolism (HCC) 01/28/2020   a.) in setting of SARS-CoV-2 infection (12/2019); noted BILATERALLY with (+) associated RIGHT heart strain; Tx'd with rivaroxaban  Xarelto  20 mg daily x 6 months then PRN   Coronary artery disease    Dyspnea    Facial laceration 06/16/2022   Date of injury: 06/08/2022  [facility] ED history below:  78 year old female presenting to the emergency department after mechanical, nonsyncopal fall causing her to strike her face on the edge/corner of a curio cabinet and sustaining a deep and approximate 8 cm laceration to the right cheek in an irregular pattern to the angle of the right lower jaw. Wound does not proceed th   GERD (gastroesophageal reflux disease)    Herniated disc, lumbar    History of 2019 novel coronavirus disease (COVID-19) 12/2019   History of bilateral cataract extraction    Hyperlipidemia    Left wrist  fracture    Lumbar stenosis    a.) s/p L2-L4 laminectomy   Melanoma (HCC) 05/12/2020   Melanoma IS R post neck, excised 08/10/20   Mobitz type 1 second degree atrioventricular block    a.) evaluated by EP --> paroxysmal in nature and felt to be vagally mediated   NICM (nonischemic cardiomyopathy) (HCC)    a. 04/2018 Echo: EF 45-50%. Anteroseptal and apical HK in some views. Mild AI/MR. Nl RV fxn; b. 05/2018 MV: mid-dist ant and antsept mild defect - ? breast atten. EF 41%. No ischemia; c.  03/2019 Echo: EF 30-35%, Gr2 DD, Trace MR, triv TR. Asc Ao ectatic dil - 38mm.    Osteoporosis    Personal history of transient ischemic attack (TIA), and cerebral infarction without residual deficits 04/22/2003   Pneumonia due to COVID-19 virus 01/26/2020   Stroke (HCC) 1998   SVT (supraventricular tachycardia) (HCC)    a.2008 s/p RFCA for AVNRT; b. 9 & 02/2019 Zio x 2: 1. Avg HR 89, 3 runs NSVT (longest 7 beats), Mobitz 1. 2. Avg HR 89, NSVT x 1 (5 beats), Mobitz 1. No signif arrhythmia.   Tendinitis of right knee 07/03/2017   Past Surgical History:  Procedure Laterality Date   APPENDECTOMY     BREAST CYST ASPIRATION Left 1977   CARDIAC CATHETERIZATION  2009   [facility] [place] [facility]   CARDIAC ELECTROPHYSIOLOGY STUDY AND ABLATION  2009   CATARACT EXTRACTION, BILATERAL  2010   COLONOSCOPY WITH PROPOFOL  N/A 03/21/2018   Procedure: COLONOSCOPY WITH Biopsy;  Surgeon: Selena Daily, MD;  [facility];  Service: Endoscopy;  Laterality: N/A;   COLONOSCOPY WITH PROPOFOL  N/A 05/03/2021   Procedure: COLONOSCOPY WITH PROPOFOL ;  Surgeon: Selena Daily, MD;  Location[facility] END[facility]COPY;  Service: Gastroenterology;  Laterality: N/A;   CORONARY ANGIOPLASTY     DARRACH'S PROCEDURE Bilateral 1977   ESOPHAGOGASTRODUODENOSCOPY  2013   gastritis; done for complaint of dysphagia   ESOPHAGOGASTRODUODENOSCOPY (EGD) WITH PROPOFOL  N/A 03/21/2018   Procedure: ESOPHAGOGASTRODUODENOSCOPY (EGD) WITH Biopsies;  Surgeon: Selena Daily, MD;  Location: [facility] SURGERY CNTR;  Service: Endoscopy;  Laterality: N/A;  KEEP THIS PATIENT FIRST   JOINT REPLACEMENT     LEFT HEART CATH AND CORONARY ANGIOGRAPHY Left 05/02/2019   Procedure: LEFT HEART CATH AND CORONARY ANGIOGRAPHY;  Surgeon: Devorah Fonder, MD;  Location: [facility] INVASIVE CV LAB;  Service: Cardiovascular;  Laterality: Left;   LUMBAR LAMINECTOMY  2017   Procedure: LUMBAR LAMINECTOMY (L2-L4)   LYMPH NODE  REMOVAL (NECK)     MOHS SURGERY  07/2016   BCCA nose   POLYPECTOMY N/A 03/21/2018   Procedure: POLYPECTOMY;  Surgeon: Selena Daily, MD;  Location: [facility] SURGERY [facility]TR;  Service: Endoscopy;  Laterality: N/A;   REPLACEMENT TOTAL KNEE Right 2013   TONSILLECTOMY     TOTAL SHOULDER REPLACEMENT Right 2012   Family History  Problem Relation Age of Onset   Diabetes Mother    Stroke Mother    Valvular heart disease Father    CAD Sister 26       died of MI   Stroke Sister    Stroke Sister 74   Clotting disorder Half-Sister    Clotting disorder Half-Sister    Breast cancer Neg Hx    Social History   Tobacco Use   Smoking status: Never   Smokeless tobacco: Never  Substance Use Topics   Alcohol use: Not Currently   LABS:  Hospital Outpatient Visit on 10/03/2023  Component Date Value Ref Range Status   MRSA, PCR 10/03/2023 NEGATIVE  NEGATIVE Final   Staphylococcus aureus 10/03/2023 NEGATIVE  NEGATIVE Final   Comment: (NOTE) The Xpert SA Assay (FDA approved for NASAL specimens in patients 13 years of age and older), is one component of a comprehensive surveillance program. It is not intended to diagnose infection nor to guide or monitor treatment. Performed at [facility], [address] [place], [place] [address]    ABO/RH(D) 10/03/2023 O POS   Final   Antibody Screen 10/03/2023 NEG   Final   Sample Expiration 10/03/2023 10/17/2023,2359   Final   Extend sample reason 10/03/2023    Final                   Value:NO TRANSFUSIONS OR PREGNANCY IN THE PAST 3 MONTHS Performed at [facility], [address] [place], [place] [address]    WBC 10/03/2023 6.9  4.0 - 10.5 K/uL Final   RBC 10/03/2023 4.98  3.87 - 5.11 MIL/uL Final   Hemoglobin 10/03/2023 15.3 (H)  12.0 - 15.0 g/dL Final   HCT 09/81/1914 46.2 (H)  36.0 - 46.0 % Final   MCV 10/03/2023 92.8  80.0 - 100.0 fL Final   MCH 10/03/2023 30.7  26.0 - 34.0 pg Final   MCHC 10/03/2023 33.1  30.0 - 36.0  g/dL Final   RDW 78/29/5621 11.9  11.5 - 15.5 % Final   Platelets 10/03/2023 240  150 - 400 K/uL Final   nRBC 10/03/2023 0.0  0.0 - 0.2 % Final   Neutrophils Relative % 10/03/2023 62  % Final   Neutro Abs 10/03/2023 4.3  1.7 - 7.7 K/uL Final   Lymphocytes Relative 10/03/2023 27  % Final   Lymphs Abs 10/03/2023 1.9  0.7 - 4.0 K/uL Final   Monocytes Relative 10/03/2023 9  % Final   Monocytes Absolute 10/03/2023 0.6  0.1 - 1.0 K/uL Final   Eosinophils Relative 10/03/2023 2  % Final   Eosinophils Absolute 10/03/2023 0.2  0.0 - 0.5 K/uL Final   Basophils Relative 10/03/2023 0  % Final   Basophils Absolute 10/03/2023 0.0  0.0 - 0.1 K/uL Final   Immature Granulocytes 10/03/2023 0  % Final   Abs Immature Granulocytes 10/03/2023 0.02  0.00 - 0.07 K/uL Final   Performed at [facility], [address] [place], [place] [address]   Sodium 10/03/2023 137  135 - 145 mmol/L Final   Potassium 10/03/2023 4.4  3.5 - 5.1 mmol/L Final   Chloride 10/03/2023 101  98 - 111 mmol/L Final   CO2 10/03/2023 28  22 - 32 mmol/L Final   Glucose, Bld 10/03/2023 92  70 - 99 mg/dL Final   Glucose reference range applies only to samples taken after fasting for at least 8 hours.   BUN 10/03/2023 20  8 - 23 mg/dL Final   Creatinine, Ser 10/03/2023 1.23 (H)  0.44 - 1.00 mg/dL Final   Calcium  10/03/2023 9.7  8.9 - 10.3 mg/dL Final   Total Protein 78/46/9629 6.4 (L)  6.5 - 8.1 g/dL Final   Albumin 52/84/1324 4.0  3.5 - 5.0 g/dL Final   AST 40/02/2724 16  15 - 41 U/L Final   ALT 10/03/2023 12  0 - 44 U/L Final   Alkaline Phosphatase 10/03/2023 50  38 - 126 U/L Final   Total Bilirubin 10/03/2023 0.9  0.0 - 1.2 mg/dL Final   GFR, Estimated 10/03/2023 46 (L)  >60 mL/min Final  Comment: (NOTE) Calculated using the CKD-EPI Creatinine Equation (2021)    Anion gap 10/03/2023 8  5 - 15 Final   Performed at [facility], [address] [place], [place] [address]   Color, Urine 10/03/2023 AMBER (A)  YELLOW  Final   BIOCHEMICALS MAY BE AFFECTED BY COLOR   APPearance 10/03/2023 CLOUDY (A)  CLEAR Final   Specific Gravity, Urine 10/03/2023 1.019  1.005 - 1.030 Final   pH 10/03/2023 7.0  5.0 - 8.0 Final   Glucose, UA 10/03/2023 150 (A)  NEGATIVE mg/dL Final   Hgb urine dipstick 10/03/2023 NEGATIVE  NEGATIVE Final   Bilirubin Urine 10/03/2023 NEGATIVE  NEGATIVE Final   Ketones, ur 10/03/2023 NEGATIVE  NEGATIVE mg/dL Final   Protein, ur 60/45/4098 NEGATIVE  NEGATIVE mg/dL Final   Nitrite 11/91/4782 NEGATIVE  NEGATIVE Final   Leukocytes,Ua 10/03/2023 TRACE (A)  NEGATIVE Final   RBC / HPF 10/03/2023 0-5  0 - 5 RBC/hpf Final   WBC, UA 10/03/2023 0-5  0 - 5 WBC/hpf Final   Bacteria, UA 10/03/2023 NONE SEEN  NONE SEEN Final   Squamous Epithelial / HPF 10/03/2023 0-5  0 - 5 /HPF Final   Mucus 10/03/2023 PRESENT   Final   Performed at [facility], [address] [place], [place] [address]   ECG: Date: 10/03/2023 Time ECG obtained: 1408 PM Rate: 77 bpm Rhythm: Sinus rhythm with first-degree AV block; IRBBB Axis (leads I and aVF): left Intervals: PR 254 ms. QRS 106 ms. QTc 434 ms. ST segment and T wave changes: No evidence of acute T wave abnormalities or significant ST segment elevation or depression.  Evidence of a possible, age undetermined, prior infarct:  No Comparison: Similar to previous tracing obtained on 06/25/2023   IMAGING / PROCEDURES: MR BRAIN WO CONTRAST performed on 06/25/2023 Left frontal cystic encephalomalacia Remote right cerebellar infarct.  CT ANGIO HEAD NECK W WO CM performed on 06/25/2023 Mild atherosclerotic changes at the carotid bifurcations bilaterally without significant stenosis. Normal variant CTA Circle of Willis without significant proximal stenosis, aneurysm, or branch vessel occlusion. Degenerative changes in the lower cervical spine.   LONG TERM CARDIAC EVENT MONITOR STUDY performed on 08/08/2023 Patch Wear Time:  14 days and 0 hours  (2025-02-01T07:16:04-499 to 2025-02-15T07:16:04-499) Normal sinus rhythm Patient had a min HR of 30 bpm, max HR of 171 bpm, and avg HR of 93 bpm. Bundle Branch Block/IVCD was present.  1701 Supraventricular Tachycardia/atrial tachycardia runs occurred, the run with the fastest interval lasting 3429 beats with a max rate of 171 bpm, the longest lasting 4 hours 49 mins with an avg rate of 106 bpm.  9 episode(s) of AV Block (2nd degree and High Grade) occurred, lasting a total of 29 secs in the setting of atrial tachycardia with marked ventricular bradycardia with variable conduction.  Isolated SVEs were rare (<1.0%), SVE Couplets were rare (<1.0%), and SVE Triplets were rare (<1.0%). Isolated VEs were rare (<1.0%), VE Couplets were rare (<1.0%), and no VE Triplets were present.  No significant patient triggered events recorded  TRANSTHORACIC ECHOCARDIOGRAM performed on 06/26/2023 Left ventricular ejection fraction, by estimation, is 50 to 55%. The left ventricle has low normal function. Left ventricular endocardial border not optimally defined to evaluate regional wall motion. There is mild left ventricular hypertrophy. Left ventricular diastolic parameters are consistent with Grade I diastolic dysfunction (impaired relaxation).  Right ventricular systolic function is normal. The right ventricular size is normal. Tricuspid regurgitation signal is inadequate for assessing PA pressure.  The mitral valve is normal in structure. Mild mitral valve regurgitation. No evidence of mitral stenosis.  The aortic valve is normal in structure. Aortic valve regurgitation is mild. Aortic valve sclerosis is present, with no evidence of aortic valve stenosis.  The inferior vena cava is normal in size with greater than 50% respiratory variability, suggesting right atrial pressure of 3 mmHg.  Agitated saline contrast bubble study was negative, with no evidence of any interatrial shunt.   DIAGNOSTIC RADIOGRAPHS OF  RIGHT HIP 2-3 VIEWS W/WO PELVIS performed on 04/26/2023 Severe degenerative changes with joint space narrowing and osteophyte formation, subchondral cysts and sclerosis of the right hip.  The left hip is noted to have mild to moderate degenerative change with some joint space narrowing superior acetabular sclerosis and small osteophyte.   No fractures or dislocations noted.   Partially visualized lumbosacral spine shows degenerative changes with disc height loss.   LEFT HEART CATHETERIZATION AND CORONARY ANGIOGRAPHY performed on 05/29/2019 Nonobstructive CAD EF estimated at 30 to 35% Global hypokinesis LVEDP 17 to 20 mm Hg     IMPRESSION AND PLAN: Audrey Martinez has been referred for pre-anesthesia review and clearance prior to her undergoing the planned anesthetic and procedural courses. Available labs, pertinent testing, and imaging results were personally reviewed by me in preparation for upcoming operative/procedural course. [facility] medical record has been updated following extensive record review and patient interview with PAT staff.   This patient has been appropriately cleared by cardiology with an overall ACCEPTABLE risk of patient experiencing significant perioperative cardiovascular complications. Based on clinical review performed today (10/06/23), barring any significant acute changes in the patient's overall condition, it is anticipated that she will be able to proceed with the planned surgical intervention. Any acute changes in clinical condition may necessitate her procedure being postponed and/or cancelled. Patient will meet with anesthesia team (MD and/or CRNA) on the day of her procedure for preoperative evaluation/assessment. Questions regarding anesthetic course will be fielded at that time.   Pre-surgical instructions were reviewed with the patient during his PAT appointment, and questions were fielded to satisfaction by PAT clinical staff. She has been instructed on  which medications that she will need to hold prior to surgery, as well as the ones that have been deemed safe/appropriate to take on the day of her procedure. As part of the general education provided by PAT, patient made aware both verbally and in writing, that she would need to abstain from the use of any illegal substances during her perioperative course. She was advised that failure to follow the provided instructions could necessitate case cancellation or result in serious perioperative complications up to and including death. Patient encouraged to contact PAT and/or her surgeon's office to discuss any questions or concerns that may arise prior to surgery; verbalized understanding.   Renate Caroline, MSN, APRN, FNP-C, CEN [facility]  Perioperative Services Nurse Practitioner Phone: [phone] Fax: [phone] 10/06/23 8:58 AM  NOTE: This note has been prepared using Dragon dictation software. Despite my best ability to proofread, there is always the potential that unintentional transcriptional errors may still occur from this process.

## 2023-10-06 NOTE — Progress Notes (Signed)
 Perioperative Services Pre-Admission/Anesthesia Testing   Date: 10/06/23 Name: Audrey Martinez MRN:   [id number]  Re: Consideration of preoperative prophylactic antibiotic change   Request sent to: Venus Ginsberg, MD (routed and/or faxed via [facility])  Planned Surgical Procedure(s):   Case: [id number] Date/Time: 10/09/23 0958  Procedure: ARTHROPLASTY, HIP, TOTAL, ANTERIOR APPROACH (Right: Hip)  Anesthesia type: Choice  Diagnosis: Primary osteoarthritis of right hip [M16.11]  Pre-op diagnosis: Primary osteoarthritis of right hip M16.11  Location: [facility] OR ROOM 02 / [facility]S FOR ANEST[facility]ES[facility]  Surgeons: Venus Ginsberg, MD   Clinical Notes:  Patient has a documented allergy/intolerance to PCN  Advising that AMOXICILLIN and CEPHALEXIN has caused her to experience urticarial rash and SOB in the past.   EMR review indicated that patient received PCN and/or cephalosporin in the past as follows: CEFAZOLIN received on 01/12/2016 with no documented ADRs. Allergy section in EMR updated to reflect tolerance.    Screened as appropriate for cephalosporin use during medication reconciliation No immediate angioedema, dysphagia, SOB, anaphylaxis symptoms. No severe rash involving mucous membranes or skin necrosis. No hospital admissions related to side effects of PCN/cephalosporin use.  No documented reaction to PCN or cephalosporin in the last 10 years.  Request:  As an evidence based approach to reducing the rate of incidence for post-operative SSI and the development of MDROs, could an agent that allows for narrower antimicrobial coverage for preoperative prophylaxis in this patient's upcoming surgical course be considered?   Currently ordered preoperative prophylactic ABX: VANCOMYCIN + GENTAMICIN.   Specifically requesting change to cephalosporin (CEFAZOLIN).   Drug of choice for many procedures; it is the most widely studied antimicrobial agent with proven efficacy for antimicrobial  prophylaxis.   Desirable duration of action, spectrum of activity against organisms commonly encountered in surgery, and it has an excellent safety profile and low cost.   Active against streptococci, methicillin-susceptible staphylococci, and many gram-negative organisms.  Despite being a first-generation cephalosporin, cefazolin is structurally different than other cephalosporins. Cefazolin has two side chains (R1 and R2) that have structures that do not match those of any other FDA-approved ?-lactams or PCN.  True allergies to cefazolin are extremely rare (<1%) and are usually specific for its side chains, not the shared core ring.  Please communicate decision with me and I will change the orders in Epic as per your direction.    Things to consider: Many patients report that they were "allergic" to PCN earlier in life, however this does not translate into a true lifelong allergy. Patients can lose sensitivity to specific IgE antibodies over time if PCN is avoided (Kleris & Lugar, 2019).  Penicillin allergies are reported by 8% to 15% of the [place]  population, but up to 95% of these allergies do not correspond to a true allergy when tested Rozetta Corns et al., 2022).   Cross-sensitivity between PCN and cephalosporins has been documented as being as high as 10%, however this estimation included data believed to have been collected in a setting where there was contamination. Newer data suggests that the prevalence of cross-sensitivity between PCN and cephalosporins is actually estimated to be closer to 1% (Hermanides et al., 2018).   Patients labeled as PCN allergic, whether they are truly allergic or not, have been found to have inferior outcomes in terms of rates of serious infection, and these patients tend to have longer hospital stays [facility] & Lugar, 2019).  Treatment related secondary infections, such as Clostridioides difficile, have been linked to the improper use of broad spectrum antibiotics  in  patients improperly labeled as PCN allergic (Kleris & Lugar, 2019).  Anaphylaxis from cephalosporins is rare and the evidence suggests that there is no increased risk of an anaphylactic type reaction when cephalosporins are used in a PCN allergic patient (Pichichero, 2006).  Citations: Hermanides J, Lemkes BA, Prins Rudine Cos MW, Terreehorst I. Presumed ?-Lactam Allergy and Cross-reactivity in the Operating Theater: A Practical Approach. Anesthesiology. 2018 Aug;129(2):335-342. doi: 10.1097/ALN.0000000000002252. PMID: 29528413.  Kleris, R. S., & Lugar, P. L. (2019). Things We Do For No Reason: Failing to Question a Penicillin Allergy History. Journal of hospital medicine, 14(10), [phone]. Advance online publication. airportbarriers.com  Pichichero, M. E. (2006). Cephalosporins can be prescribed safely for penicillin-allergic patients. Journal of family medicine, 55(2), 106-112. Accessed: https://cdn.mdedge.com/files/s66fs-public/Document/September-2017/5502JFP_AppliedEvidence1.pdf  Levada Raymond., & Wayna Hails, D. R. (2022). Understanding Penicillin Allergy, Cross-reactivity, and Antibiotic Selection in the Preoperative Setting. The Journal of the American Academy of Orthopaedic Surgeons, 30(1), e1-e5. CallRank.tn   Renate Caroline, MSN, APRN, FNP-C, CEN [facility]  Perioperative Services Nurse Practitioner Phone: [phone] Fax: [phone] 10/06/23 9:46 AM  NOTE: This note has been prepared using Dragon dictation software. Despite my best ability to proofread, there is always the potential that unintentional transcriptional errors may still occur from this process.

## 2023-10-06 NOTE — Telephone Encounter (Signed)
 Shirline Dover, NP  P Cv Div Preop Callback Hello team...  Wanted to follow up on this one. Surgery is Monday. I see Aleta Anda was reaching out to Dr. Marven Slimmer. Do we have any updates?  Respectfully, Renate Caroline, MSN, APRN, FNP-C, CEN [facility] Perioperative Services Nurse Practitioner 10/06/23 7:57 AM

## 2023-10-09 ENCOUNTER — Other Ambulatory Visit: Payer: Self-pay

## 2023-10-09 ENCOUNTER — Inpatient Hospital Stay
Admission: RE | Admit: 2023-10-09 | Discharge: 2023-10-13 | DRG: 469 | Disposition: A | Discharge status: Skilled Nursing Facility | Attending: Orthopedic Surgery | Admitting: Orthopedic Surgery

## 2023-10-09 ENCOUNTER — Encounter: Payer: Self-pay | Admitting: Orthopedic Surgery

## 2023-10-09 ENCOUNTER — Ambulatory Visit: Payer: Self-pay | Admitting: Urgent Care

## 2023-10-09 ENCOUNTER — Encounter: Admission: RE | Payer: Self-pay | Source: Home / Self Care

## 2023-10-09 ENCOUNTER — Ambulatory Visit

## 2023-10-09 DIAGNOSIS — Z8701 Personal history of pneumonia (recurrent): Secondary | ICD-10-CM

## 2023-10-09 DIAGNOSIS — Z885 Allergy status to narcotic agent status: Secondary | ICD-10-CM

## 2023-10-09 DIAGNOSIS — J189 Pneumonia, unspecified organism: Secondary | ICD-10-CM | POA: Diagnosis not present

## 2023-10-09 DIAGNOSIS — I428 Other cardiomyopathies: Secondary | ICD-10-CM | POA: Diagnosis present

## 2023-10-09 DIAGNOSIS — Z9181 History of falling: Secondary | ICD-10-CM

## 2023-10-09 DIAGNOSIS — Z8673 Personal history of transient ischemic attack (TIA), and cerebral infarction without residual deficits: Secondary | ICD-10-CM

## 2023-10-09 DIAGNOSIS — I4892 Unspecified atrial flutter: Secondary | ICD-10-CM | POA: Diagnosis not present

## 2023-10-09 DIAGNOSIS — I700 Atherosclerosis of aorta: Secondary | ICD-10-CM | POA: Diagnosis present

## 2023-10-09 DIAGNOSIS — M1631 Unilateral osteoarthritis resulting from hip dysplasia, right hip: Secondary | ICD-10-CM | POA: Diagnosis present

## 2023-10-09 DIAGNOSIS — I5022 Chronic systolic (congestive) heart failure: Secondary | ICD-10-CM | POA: Diagnosis not present

## 2023-10-09 DIAGNOSIS — I2510 Atherosclerotic heart disease of native coronary artery without angina pectoris: Secondary | ICD-10-CM | POA: Diagnosis present

## 2023-10-09 DIAGNOSIS — G039 Meningitis, unspecified: Secondary | ICD-10-CM | POA: Diagnosis not present

## 2023-10-09 DIAGNOSIS — Z823 Family history of stroke: Secondary | ICD-10-CM

## 2023-10-09 DIAGNOSIS — T428X5A Adverse effect of antiparkinsonism drugs and other central muscle-tone depressants, initial encounter: Secondary | ICD-10-CM | POA: Diagnosis not present

## 2023-10-09 DIAGNOSIS — Z833 Family history of diabetes mellitus: Secondary | ICD-10-CM

## 2023-10-09 DIAGNOSIS — E782 Mixed hyperlipidemia: Secondary | ICD-10-CM | POA: Diagnosis not present

## 2023-10-09 DIAGNOSIS — R509 Fever, unspecified: Secondary | ICD-10-CM | POA: Diagnosis not present

## 2023-10-09 DIAGNOSIS — G629 Polyneuropathy, unspecified: Secondary | ICD-10-CM | POA: Diagnosis not present

## 2023-10-09 DIAGNOSIS — Z9109 Other allergy status, other than to drugs and biological substances: Secondary | ICD-10-CM

## 2023-10-09 DIAGNOSIS — E669 Obesity, unspecified: Secondary | ICD-10-CM | POA: Diagnosis present

## 2023-10-09 DIAGNOSIS — M6281 Muscle weakness (generalized): Secondary | ICD-10-CM | POA: Diagnosis not present

## 2023-10-09 DIAGNOSIS — N1831 Chronic kidney disease, stage 3a: Secondary | ICD-10-CM | POA: Diagnosis not present

## 2023-10-09 DIAGNOSIS — Z96641 Presence of right artificial hip joint: Secondary | ICD-10-CM | POA: Diagnosis not present

## 2023-10-09 DIAGNOSIS — R918 Other nonspecific abnormal finding of lung field: Secondary | ICD-10-CM | POA: Diagnosis not present

## 2023-10-09 DIAGNOSIS — Z86711 Personal history of pulmonary embolism: Secondary | ICD-10-CM

## 2023-10-09 DIAGNOSIS — I5020 Unspecified systolic (congestive) heart failure: Secondary | ICD-10-CM | POA: Diagnosis present

## 2023-10-09 DIAGNOSIS — G9389 Other specified disorders of brain: Secondary | ICD-10-CM | POA: Diagnosis not present

## 2023-10-09 DIAGNOSIS — G928 Other toxic encephalopathy: Secondary | ICD-10-CM | POA: Diagnosis not present

## 2023-10-09 DIAGNOSIS — Z9842 Cataract extraction status, left eye: Secondary | ICD-10-CM

## 2023-10-09 DIAGNOSIS — Z888 Allergy status to other drugs, medicaments and biological substances status: Secondary | ICD-10-CM

## 2023-10-09 DIAGNOSIS — R5082 Postprocedural fever: Secondary | ICD-10-CM | POA: Diagnosis not present

## 2023-10-09 DIAGNOSIS — F0280 Dementia in other diseases classified elsewhere without behavioral disturbance: Secondary | ICD-10-CM | POA: Diagnosis not present

## 2023-10-09 DIAGNOSIS — G309 Alzheimer's disease, unspecified: Secondary | ICD-10-CM | POA: Diagnosis present

## 2023-10-09 DIAGNOSIS — M25551 Pain in right hip: Secondary | ICD-10-CM | POA: Diagnosis present

## 2023-10-09 DIAGNOSIS — Z741 Need for assistance with personal care: Secondary | ICD-10-CM | POA: Diagnosis not present

## 2023-10-09 DIAGNOSIS — Z8616 Personal history of COVID-19: Secondary | ICD-10-CM | POA: Diagnosis not present

## 2023-10-09 DIAGNOSIS — J45909 Unspecified asthma, uncomplicated: Secondary | ICD-10-CM | POA: Diagnosis present

## 2023-10-09 DIAGNOSIS — Z9861 Coronary angioplasty status: Secondary | ICD-10-CM

## 2023-10-09 DIAGNOSIS — R4189 Other symptoms and signs involving cognitive functions and awareness: Secondary | ICD-10-CM | POA: Diagnosis not present

## 2023-10-09 DIAGNOSIS — M810 Age-related osteoporosis without current pathological fracture: Secondary | ICD-10-CM | POA: Diagnosis not present

## 2023-10-09 DIAGNOSIS — Z9841 Cataract extraction status, right eye: Secondary | ICD-10-CM

## 2023-10-09 DIAGNOSIS — Z7901 Long term (current) use of anticoagulants: Secondary | ICD-10-CM | POA: Diagnosis not present

## 2023-10-09 DIAGNOSIS — Z96651 Presence of right artificial knee joint: Secondary | ICD-10-CM | POA: Diagnosis present

## 2023-10-09 DIAGNOSIS — I10 Essential (primary) hypertension: Secondary | ICD-10-CM | POA: Diagnosis present

## 2023-10-09 DIAGNOSIS — Z881 Allergy status to other antibiotic agents status: Secondary | ICD-10-CM

## 2023-10-09 DIAGNOSIS — L570 Actinic keratosis: Secondary | ICD-10-CM | POA: Diagnosis present

## 2023-10-09 DIAGNOSIS — Z1152 Encounter for screening for COVID-19: Secondary | ICD-10-CM

## 2023-10-09 DIAGNOSIS — Z6834 Body mass index (BMI) 34.0-34.9, adult: Secondary | ICD-10-CM | POA: Diagnosis not present

## 2023-10-09 DIAGNOSIS — R0989 Other specified symptoms and signs involving the circulatory and respiratory systems: Secondary | ICD-10-CM | POA: Diagnosis not present

## 2023-10-09 DIAGNOSIS — Z87820 Personal history of traumatic brain injury: Secondary | ICD-10-CM

## 2023-10-09 DIAGNOSIS — K219 Gastro-esophageal reflux disease without esophagitis: Secondary | ICD-10-CM | POA: Diagnosis present

## 2023-10-09 DIAGNOSIS — G9340 Encephalopathy, unspecified: Secondary | ICD-10-CM | POA: Diagnosis not present

## 2023-10-09 DIAGNOSIS — Z7951 Long term (current) use of inhaled steroids: Secondary | ICD-10-CM | POA: Diagnosis not present

## 2023-10-09 DIAGNOSIS — R519 Headache, unspecified: Secondary | ICD-10-CM | POA: Diagnosis not present

## 2023-10-09 DIAGNOSIS — I959 Hypotension, unspecified: Secondary | ICD-10-CM | POA: Diagnosis not present

## 2023-10-09 DIAGNOSIS — R278 Other lack of coordination: Secondary | ICD-10-CM | POA: Diagnosis not present

## 2023-10-09 DIAGNOSIS — Z85820 Personal history of malignant melanoma of skin: Secondary | ICD-10-CM

## 2023-10-09 DIAGNOSIS — J9811 Atelectasis: Secondary | ICD-10-CM | POA: Diagnosis not present

## 2023-10-09 DIAGNOSIS — I130 Hypertensive heart and chronic kidney disease with heart failure and stage 1 through stage 4 chronic kidney disease, or unspecified chronic kidney disease: Secondary | ICD-10-CM | POA: Diagnosis present

## 2023-10-09 DIAGNOSIS — Z880 Allergy status to penicillin: Secondary | ICD-10-CM

## 2023-10-09 DIAGNOSIS — Z136 Encounter for screening for cardiovascular disorders: Secondary | ICD-10-CM | POA: Diagnosis not present

## 2023-10-09 DIAGNOSIS — M1611 Unilateral primary osteoarthritis, right hip: Principal | ICD-10-CM | POA: Diagnosis present

## 2023-10-09 DIAGNOSIS — Z743 Need for continuous supervision: Secondary | ICD-10-CM | POA: Diagnosis not present

## 2023-10-09 DIAGNOSIS — Z886 Allergy status to analgesic agent status: Secondary | ICD-10-CM

## 2023-10-09 DIAGNOSIS — Z96611 Presence of right artificial shoulder joint: Secondary | ICD-10-CM | POA: Diagnosis not present

## 2023-10-09 DIAGNOSIS — I441 Atrioventricular block, second degree: Secondary | ICD-10-CM | POA: Diagnosis present

## 2023-10-09 DIAGNOSIS — T402X5A Adverse effect of other opioids, initial encounter: Secondary | ICD-10-CM | POA: Diagnosis not present

## 2023-10-09 DIAGNOSIS — I447 Left bundle-branch block, unspecified: Secondary | ICD-10-CM | POA: Diagnosis present

## 2023-10-09 DIAGNOSIS — Z8249 Family history of ischemic heart disease and other diseases of the circulatory system: Secondary | ICD-10-CM | POA: Diagnosis not present

## 2023-10-09 DIAGNOSIS — R2681 Unsteadiness on feet: Secondary | ICD-10-CM | POA: Diagnosis not present

## 2023-10-09 DIAGNOSIS — Z471 Aftercare following joint replacement surgery: Secondary | ICD-10-CM | POA: Diagnosis not present

## 2023-10-09 DIAGNOSIS — Z85828 Personal history of other malignant neoplasm of skin: Secondary | ICD-10-CM

## 2023-10-09 DIAGNOSIS — Z79899 Other long term (current) drug therapy: Secondary | ICD-10-CM

## 2023-10-09 HISTORY — DX: Atrioventricular block, second degree: I44.1

## 2023-10-09 HISTORY — DX: Spinal stenosis, lumbar region without neurogenic claudication: M48.061

## 2023-10-09 HISTORY — DX: Unspecified systolic (congestive) heart failure: I50.20

## 2023-10-09 HISTORY — DX: Atherosclerosis of aorta: I70.0

## 2023-10-09 HISTORY — DX: Other cervical disc degeneration, unspecified cervical region: M50.30

## 2023-10-09 HISTORY — PX: TOTAL HIP ARTHROPLASTY: SHX124

## 2023-10-09 HISTORY — DX: Cataract extraction status, right eye: Z98.42

## 2023-10-09 HISTORY — DX: Left bundle-branch block, unspecified: I44.7

## 2023-10-09 HISTORY — DX: Other supraventricular tachycardia: I47.19

## 2023-10-09 HISTORY — DX: Other cardiomyopathies: I42.8

## 2023-10-09 HISTORY — DX: Cataract extraction status, right eye: Z98.41

## 2023-10-09 SURGERY — ARTHROPLASTY, HIP, TOTAL, ANTERIOR APPROACH
Anesthesia: Spinal | Site: Hip | Laterality: Right

## 2023-10-09 MED ORDER — METOCLOPRAMIDE HCL 10 MG PO TABS: 5.0000 mg | ORAL_TABLET | Freq: Three times a day (TID) | ORAL | Status: DC | PRN

## 2023-10-09 MED ORDER — ONDANSETRON HCL 4 MG PO TABS: 4.0000 mg | ORAL_TABLET | Freq: Four times a day (QID) | ORAL | Status: DC | PRN

## 2023-10-09 MED ORDER — DEXAMETHASONE SODIUM PHOSPHATE 10 MG/ML IJ SOLN
INTRAMUSCULAR | Status: DC | PRN
  Administered 2023-10-09: 10 mg via INTRAVENOUS

## 2023-10-09 MED ORDER — VANCOMYCIN HCL IN DEXTROSE 1-5 GM/200ML-% IV SOLN
1000.0000 mg | Freq: Two times a day (BID) | INTRAVENOUS | Status: AC
  Administered 2023-10-09: 1000 mg via INTRAVENOUS

## 2023-10-09 MED ORDER — FENTANYL CITRATE (PF) 100 MCG/2ML IJ SOLN
INTRAMUSCULAR | Status: DC | PRN
  Administered 2023-10-09: 25 ug via INTRAVENOUS

## 2023-10-09 MED ORDER — MORPHINE SULFATE (PF) 2 MG/ML IV SOLN: 0.5000 mg | INTRAVENOUS | Status: DC | PRN

## 2023-10-09 MED ORDER — SODIUM CHLORIDE (PF) 0.9 % IJ SOLN
INTRAMUSCULAR | Status: AC
  Filled 2023-10-09: qty 10

## 2023-10-09 MED ORDER — FENTANYL CITRATE (PF) 100 MCG/2ML IJ SOLN: 25.0000 ug | INTRAMUSCULAR | Status: DC | PRN

## 2023-10-09 MED ORDER — GABAPENTIN 100 MG PO CAPS
300.0000 mg | ORAL_CAPSULE | Freq: Three times a day (TID) | ORAL | Status: DC
Start: 2023-10-10 — End: 2023-10-13
  Administered 2023-10-10 – 2023-10-13 (×9): 300 mg via ORAL
  Filled 2023-10-09 (×4): qty 3

## 2023-10-09 MED ORDER — CHLORHEXIDINE GLUCONATE 0.12 % MT SOLN
15.0000 mL | Freq: Once | OROMUCOSAL | Status: AC
  Administered 2023-10-09: 15 mL via OROMUCOSAL

## 2023-10-09 MED ORDER — TRAMADOL HCL 50 MG PO TABS
50.0000 mg | ORAL_TABLET | Freq: Four times a day (QID) | ORAL | Status: DC | PRN
  Administered 2023-10-12 – 2023-10-13 (×3): 50 mg via ORAL
  Filled 2023-10-09 (×2): qty 1

## 2023-10-09 MED ORDER — 0.9 % SODIUM CHLORIDE (POUR BTL) OPTIME
TOPICAL | Status: DC | PRN
  Administered 2023-10-09: 500 mL

## 2023-10-09 MED ORDER — BUPIVACAINE HCL (PF) 0.5 % IJ SOLN
INTRAMUSCULAR | Status: DC | PRN
Start: 2023-10-09 — End: 2023-10-09
  Administered 2023-10-09: 2.6 mL

## 2023-10-09 MED ORDER — MIDAZOLAM HCL 2 MG/2ML IJ SOLN
INTRAMUSCULAR | Status: AC
  Filled 2023-10-09: qty 2

## 2023-10-09 MED ORDER — BACLOFEN 10 MG PO TABS
10.0000 mg | ORAL_TABLET | Freq: Three times a day (TID) | ORAL | Status: DC
  Administered 2023-10-09 – 2023-10-10 (×5): 10 mg via ORAL
  Filled 2023-10-09 (×7): qty 1

## 2023-10-09 MED ORDER — TRANEXAMIC ACID-NACL 1000-0.7 MG/100ML-% IV SOLN
INTRAVENOUS | Status: AC
  Filled 2023-10-09: qty 100

## 2023-10-09 MED ORDER — BUPIVACAINE-EPINEPHRINE (PF) 0.25% -1:200000 IJ SOLN
INTRAMUSCULAR | Status: AC
  Filled 2023-10-09: qty 30

## 2023-10-09 MED ORDER — SODIUM CHLORIDE 0.9 % IV SOLN: INTRAVENOUS | Status: DC

## 2023-10-09 MED ORDER — ONDANSETRON HCL 4 MG/2ML IJ SOLN: 4.0000 mg | Freq: Four times a day (QID) | INTRAMUSCULAR | Status: DC | PRN

## 2023-10-09 MED ORDER — SODIUM CHLORIDE 0.9 % IR SOLN
Status: DC | PRN
  Administered 2023-10-09: 100 mL

## 2023-10-09 MED ORDER — STERILE WATER FOR IRRIGATION IR SOLN
Status: DC | PRN
  Administered 2023-10-09: 1000 mL

## 2023-10-09 MED ORDER — ONDANSETRON HCL 4 MG/2ML IJ SOLN
INTRAMUSCULAR | Status: DC | PRN
  Administered 2023-10-09: 4 mg via INTRAVENOUS

## 2023-10-09 MED ORDER — PHENYLEPHRINE HCL-NACL 20-0.9 MG/250ML-% IV SOLN
INTRAVENOUS | Status: DC | PRN
Start: 2023-10-09 — End: 2023-10-09
  Administered 2023-10-09: 50 ug/min via INTRAVENOUS

## 2023-10-09 MED ORDER — PROPOFOL 500 MG/50ML IV EMUL
INTRAVENOUS | Status: DC | PRN
  Administered 2023-10-09: 75 ug/kg/min via INTRAVENOUS

## 2023-10-09 MED ORDER — PROPOFOL 1000 MG/100ML IV EMUL
INTRAVENOUS | Status: AC
  Filled 2023-10-09: qty 100

## 2023-10-09 MED ORDER — TRANEXAMIC ACID-NACL 1000-0.7 MG/100ML-% IV SOLN
1000.0000 mg | INTRAVENOUS | Status: AC
  Administered 2023-10-09 (×2): 1000 mg via INTRAVENOUS

## 2023-10-09 MED ORDER — DOCUSATE SODIUM 100 MG PO CAPS
100.0000 mg | ORAL_CAPSULE | Freq: Two times a day (BID) | ORAL | Status: DC
  Administered 2023-10-09 – 2023-10-13 (×7): 100 mg via ORAL
  Filled 2023-10-09 (×7): qty 1

## 2023-10-09 MED ORDER — ORAL CARE MOUTH RINSE: 15.0000 mL | Freq: Once | OROMUCOSAL | Status: AC

## 2023-10-09 MED ORDER — LACTATED RINGERS IV SOLN
INTRAVENOUS | Status: DC
Start: 2023-10-09 — End: 2023-10-09

## 2023-10-09 MED ORDER — GABAPENTIN 100 MG PO CAPS
300.0000 mg | ORAL_CAPSULE | ORAL | Status: DC
Start: 2023-10-09 — End: 2023-10-09

## 2023-10-09 MED ORDER — CARVEDILOL 3.125 MG PO TABS
6.2500 mg | ORAL_TABLET | Freq: Two times a day (BID) | ORAL | Status: DC
  Administered 2023-10-10 – 2023-10-13 (×7): 6.25 mg via ORAL
  Filled 2023-10-09 (×6): qty 2

## 2023-10-09 MED ORDER — METOCLOPRAMIDE HCL 5 MG/ML IJ SOLN: 5.0000 mg | Freq: Three times a day (TID) | INTRAMUSCULAR | Status: DC | PRN

## 2023-10-09 MED ORDER — SPIRONOLACTONE 12.5 MG HALF TABLET
12.5000 mg | ORAL_TABLET | Freq: Every day | ORAL | Status: DC
  Administered 2023-10-10: 12.5 mg via ORAL
  Filled 2023-10-09 (×4): qty 1

## 2023-10-09 MED ORDER — ACETAMINOPHEN 325 MG PO TABS
325.0000 mg | ORAL_TABLET | Freq: Four times a day (QID) | ORAL | Status: DC | PRN
  Administered 2023-10-11 – 2023-10-13 (×3): 650 mg via ORAL
  Filled 2023-10-09 (×3): qty 2

## 2023-10-09 MED ORDER — GABAPENTIN 400 MG PO CAPS
900.0000 mg | ORAL_CAPSULE | Freq: Every day | ORAL | Status: DC
  Administered 2023-10-09 – 2023-10-12 (×4): 900 mg via ORAL
  Filled 2023-10-09 (×4): qty 1

## 2023-10-09 MED ORDER — ENOXAPARIN SODIUM 40 MG/0.4ML IJ SOSY
40.0000 mg | PREFILLED_SYRINGE | INTRAMUSCULAR | Status: DC
  Administered 2023-10-10 – 2023-10-13 (×4): 40 mg via SUBCUTANEOUS
  Filled 2023-10-09 (×4): qty 0.4

## 2023-10-09 MED ORDER — MENTHOL 3 MG MT LOZG: 1.0000 | LOZENGE | OROMUCOSAL | Status: DC | PRN

## 2023-10-09 MED ORDER — VANCOMYCIN HCL IN DEXTROSE 1-5 GM/200ML-% IV SOLN
INTRAVENOUS | Status: AC
  Filled 2023-10-09: qty 200

## 2023-10-09 MED ORDER — PHENYLEPHRINE HCL-NACL 20-0.9 MG/250ML-% IV SOLN
INTRAVENOUS | Status: AC
  Filled 2023-10-09: qty 250

## 2023-10-09 MED ORDER — SODIUM CHLORIDE (PF) 0.9 % IJ SOLN
INTRAMUSCULAR | Status: DC | PRN
  Administered 2023-10-09: 50 mL via INTRAMUSCULAR

## 2023-10-09 MED ORDER — BUPIVACAINE LIPOSOME 1.3 % IJ SUSP
INTRAMUSCULAR | Status: AC
  Filled 2023-10-09: qty 20

## 2023-10-09 MED ORDER — GENTAMICIN SULFATE 40 MG/ML IJ SOLN
5.0000 mg/kg | INTRAVENOUS | Status: AC
  Administered 2023-10-09: 460 mg via INTRAVENOUS
  Filled 2023-10-09: qty 11.5

## 2023-10-09 MED ORDER — SURGIFLO WITH THROMBIN (HEMOSTATIC MATRIX KIT) OPTIME
TOPICAL | Status: DC | PRN
  Administered 2023-10-09: 1 via TOPICAL

## 2023-10-09 MED ORDER — SACUBITRIL-VALSARTAN 24-26 MG PO TABS
1.0000 | ORAL_TABLET | Freq: Two times a day (BID) | ORAL | Status: DC
  Administered 2023-10-09 – 2023-10-10 (×3): 1 via ORAL
  Filled 2023-10-09 (×5): qty 1

## 2023-10-09 MED ORDER — EZETIMIBE 10 MG PO TABS
10.0000 mg | ORAL_TABLET | Freq: Every day | ORAL | Status: DC
  Administered 2023-10-10 – 2023-10-13 (×3): 10 mg via ORAL

## 2023-10-09 MED ORDER — TRANEXAMIC ACID-NACL 1000-0.7 MG/100ML-% IV SOLN
INTRAVENOUS | Status: AC
Start: 2023-10-09 — End: ?
  Filled 2023-10-09: qty 100

## 2023-10-09 MED ORDER — VANCOMYCIN HCL IN DEXTROSE 1-5 GM/200ML-% IV SOLN
1000.0000 mg | INTRAVENOUS | Status: AC
  Administered 2023-10-09: 1000 mg via INTRAVENOUS

## 2023-10-09 MED ORDER — PHENOL 1.4 % MT LIQD: 1.0000 | OROMUCOSAL | Status: DC | PRN

## 2023-10-09 MED ORDER — SURGIPHOR WOUND IRRIGATION SYSTEM - OPTIME
TOPICAL | Status: DC | PRN
  Administered 2023-10-09: 450 mL via TOPICAL

## 2023-10-09 MED ORDER — FENTANYL CITRATE (PF) 100 MCG/2ML IJ SOLN
INTRAMUSCULAR | Status: AC
  Filled 2023-10-09: qty 2

## 2023-10-09 MED ORDER — VANCOMYCIN HCL IN DEXTROSE 1-5 GM/200ML-% IV SOLN
INTRAVENOUS | Status: AC
Start: 2023-10-09 — End: ?
  Filled 2023-10-09: qty 200

## 2023-10-09 MED ORDER — ACETAMINOPHEN 500 MG PO TABS
1000.0000 mg | ORAL_TABLET | Freq: Three times a day (TID) | ORAL | Status: AC
  Administered 2023-10-09 – 2023-10-10 (×3): 1000 mg via ORAL
  Filled 2023-10-09 (×4): qty 2

## 2023-10-09 MED ORDER — CHLORHEXIDINE GLUCONATE 0.12 % MT SOLN
OROMUCOSAL | Status: AC
  Filled 2023-10-09: qty 15

## 2023-10-09 MED ORDER — HYDROCODONE-ACETAMINOPHEN 5-325 MG PO TABS
1.0000 | ORAL_TABLET | ORAL | Status: DC | PRN
  Administered 2023-10-09: 2 via ORAL
  Administered 2023-10-09 – 2023-10-10 (×2): 1 via ORAL
  Administered 2023-10-10: 2 via ORAL
  Filled 2023-10-09 (×5): qty 1

## 2023-10-09 SURGICAL SUPPLY — 62 items
BIT DRILL TRIDENT 4X40 SU (BIT) IMPLANT
BLADE CLIPPER SURG (BLADE) IMPLANT
BLADE SAGITTAL AGGR TOOTH XLG (BLADE) ×1 IMPLANT
BNDG COHESIVE 6X5 TAN ST LF (GAUZE/BANDAGES/DRESSINGS) ×2 IMPLANT
BRUSH SCRUB EZ PLAIN DRY (MISCELLANEOUS) ×1 IMPLANT
CHLORAPREP W/TINT 26 (MISCELLANEOUS) ×1 IMPLANT
COVER LIGHT HANDLE STERIS (MISCELLANEOUS) IMPLANT
DERMABOND ADVANCED .7 DNX12 (GAUZE/BANDAGES/DRESSINGS) ×1 IMPLANT
DRAPE C-ARM XRAY 36X54 (DRAPES) ×1 IMPLANT
DRAPE SHEET LG 3/4 BI-LAMINATE (DRAPES) ×3 IMPLANT
DRAPE TABLE BACK 80X90 (DRAPES) ×1 IMPLANT
DRSG MEPILEX SACRM 8.7X9.8 (GAUZE/BANDAGES/DRESSINGS) ×1 IMPLANT
DRSG OPSITE POSTOP 4X8 (GAUZE/BANDAGES/DRESSINGS) ×1 IMPLANT
ELECTRODE BLDE 4.0 EZ CLN MEGD (MISCELLANEOUS) ×1 IMPLANT
ELECTRODE REM PT RTRN 9FT ADLT (ELECTROSURGICAL) ×1 IMPLANT
GLOVE BIO SURGEON STRL SZ8 (GLOVE) ×1 IMPLANT
GLOVE BIOGEL PI IND STRL 8 (GLOVE) ×1 IMPLANT
GLOVE PI ORTHO PRO STRL 7.5 (GLOVE) ×2 IMPLANT
GLOVE PI ORTHO PRO STRL SZ8 (GLOVE) ×2 IMPLANT
GLOVE SURG SYN 7.5 E (GLOVE) ×2 IMPLANT
GLOVE SURG SYN 7.5 PF PI (GLOVE) ×1 IMPLANT
GOWN SRG XL LVL 3 NONREINFORCE (GOWNS) ×1 IMPLANT
GOWN STRL REUS W/ TWL LRG LVL3 (GOWN DISPOSABLE) ×1 IMPLANT
GOWN STRL REUS W/ TWL XL LVL3 (GOWN DISPOSABLE) ×1 IMPLANT
HEAD BIOLOX HIP 36/-2.5 (Joint) IMPLANT
HOOD PEEL AWAY T7 (MISCELLANEOUS) ×2 IMPLANT
INSERT 0 DEGREE 36 (Miscellaneous) IMPLANT
IV NS 100ML SINGLE PACK (IV SOLUTION) ×1 IMPLANT
KIT PATIENT CARE HANA TABLE (KITS) ×1 IMPLANT
KIT TURNOVER CYSTO (KITS) ×1 IMPLANT
LIGHT WAVEGUIDE WIDE FLAT (MISCELLANEOUS) ×1 IMPLANT
MANIFOLD NEPTUNE II (INSTRUMENTS) ×1 IMPLANT
MARKER SKIN DUAL TIP RULER LAB (MISCELLANEOUS) ×1 IMPLANT
MAT ABSORB FLUID 56X50 GRAY (MISCELLANEOUS) ×1 IMPLANT
NDL SPNL 20GX3.5 QUINCKE YW (NEEDLE) ×1 IMPLANT
NEEDLE SPNL 20GX3.5 QUINCKE YW (NEEDLE) ×1 IMPLANT
NS IRRIG 500ML POUR BTL (IV SOLUTION) ×1 IMPLANT
PACK HIP COMPR (MISCELLANEOUS) ×1 IMPLANT
PAD ARMBOARD POSITIONER FOAM (MISCELLANEOUS) ×1 IMPLANT
PENCIL SMOKE EVACUATOR (MISCELLANEOUS) ×1 IMPLANT
SCREW HEX LP 6.5X20 (Screw) IMPLANT
SCREW HEX LP 6.5X25 (Screw) IMPLANT
SHELL TRIDENT II CLUST 50 (Shell) IMPLANT
SLEEVE SCD COMPRESS KNEE MED (STOCKING) ×1 IMPLANT
SOLUTION IRRIG SURGIPHOR (IV SOLUTION) ×1 IMPLANT
STEM STD OFFSET SZ5 36 (Stem) IMPLANT
SURGIFLO W/THROMBIN 8M KIT (HEMOSTASIS) IMPLANT
SUT BONE WAX W31G (SUTURE) ×1 IMPLANT
SUT ETHIBOND 2 V 37 (SUTURE) ×1 IMPLANT
SUT SILK 0 30XBRD TIE 6 (SUTURE) ×1 IMPLANT
SUT STRATAFIX 14 PDO 36 VLT (SUTURE) ×1 IMPLANT
SUT STRATAFIX PDO 1 14 VIOLET (SUTURE) ×1 IMPLANT
SUT VIC AB 0 CT1 36 (SUTURE) ×1 IMPLANT
SUT VIC AB 2-0 CT2 27 (SUTURE) ×1 IMPLANT
SUTURE STRATA 1 CT-1 DLB (SUTURE) ×1 IMPLANT
SUTURE STRATA SPIR 4-0 18 (SUTURE) ×1 IMPLANT
SYR 20ML LL LF (SYRINGE) ×2 IMPLANT
TAPE MICROFOAM 4IN (TAPE) IMPLANT
TOWEL OR 17X26 4PK STRL BLUE (TOWEL DISPOSABLE) IMPLANT
TRAP FLUID SMOKE EVACUATOR (MISCELLANEOUS) ×1 IMPLANT
WAND WEREWOLF FASTSEAL 6.0 (MISCELLANEOUS) ×1 IMPLANT
WATER STERILE IRR 1000ML POUR (IV SOLUTION) ×1 IMPLANT

## 2023-10-09 NOTE — Op Note (Signed)
 Patient Name: Audrey Martinez  WUJ:[id number]  Pre-Operative Diagnosis: Right hip Osteoarthritis  Post-Operative Diagnosis: (same)  Procedure: Right Total Hip Arthroplasty  Components/Implants: Cup: Trident Tritanium Clusterhole 50/D w/x2 screws    Liner: Neutral X3 poly 36/D  Stem: Insignia #5 Std offset  Head:Biolox ceramic 36 -2.56mm  Date of Surgery: 10/09/2023  Surgeon: Venus Ginsberg MD  Assistant: Francenia Ingle PA (present and scrubbed throughout the case, critical for assistance with exposure, retraction, instrumentation, and closure)   Anesthesiologist: Paz Bott  Anesthesia: Spinal   EBL: 200cc  IVF:500cc  Complications: None   Brief history: The patient is a 78 year old female with a history of osteoarthritis of the right hip with pain limiting their range of motion and activities of daily living, which has failed multiple attempts at conservative therapy.  The risks and benefits of total hip arthroplasty as definitive surgical treatment were discussed with the patient, who opted to proceed with the operation.  After outpatient medical clearance and optimization was completed the patient was admitted to [facility] for the procedure.  All preoperative films were reviewed and an appropriate surgical plan was made prior to surgery.   Description of procedure: The patient was brought to the operating room where laterality was confirmed by all those present to be the right side.  The patient was administered spinal anesthesia on a stretcher prior to being moved supine on the operating room table. Patient was given an intravenous dose of antibiotics for surgical prophylaxis and TXA.  All bony prominences and extremities were well padded and the patient was securely attached to the table boots, a perineal post was placed and the patient had a safety strap placed.  Surgical site was prepped with alcohol and chlorhexidine. The surgical site over the hip was and draped  in typical sterile fashion with multiple layers of adhesive and nonadhesive drapes.  The incision site was marked out with a sterile marker and care was taken to assess the position of the ASIS and ensure appropriate position for the incision.    A surgical timeout was then called with participation of all staff in the room the patient was then a confirmed again and laterality confirmed.  Incision was made over the anterior lateral aspect of the proximal thigh in line with the TFL.  Appropriate retractors were placed and all bleeding vessels were coagulated within the subcutaneous and fatty layers.  An incision was made in the TFL fascia in the interval was carefully identified.  The lateral ascending branches of the circumflex vessels were identified, cauterized and carefully dissected. The main vessels were then tied with a 0 silk hand tie.  Retractors were placed around the superior lateral and inferior medial aspects of the femoral neck and a capsulotomy was performed exposing the hip joint.  Retraction stitches were placed and the capsulotomy to assist with visualization.  Femoral neck cut was then made and the femoral head was extracted after placing the leg in traction.  Bone wax was then applied to the proximal cut surface of the femur and aqua mantis was used to address any bleeding around the femoral neck cut.  Retractors were then placed around the acetabulum to fully visualize the joint space, and the remaining labral tissue was removed and pulvinar was removed.   The acetabulum was then sequentially reamed up to the appropriate size in order to get good fit and fill for the acetabular component while under fluoroscopic guidance.  Acetabular component was then placed and malleted into a  secure fit while confirming position and abduction angle and anteversion utilizing fluoroscopy.  2 screws were then placed in the acetabular cup to assist in securing the cup in place. The cup was irrigated,  a real  neutral liner was placed, impacted, and checked for stability. The femur traction was dropped and sequentially externally rotated while performing a release of the posterior and superomedial tissues off of the proximal femur to allow for mobility, care was taken to preserve the external rotators and piriformis attachments.  The remaining interval between the abductors and the capsule was dissected out and a retractor was placed over the superolateral aspect of the femur over the greater trochanter.  The leg was carefully brought down into extension and adducted to provide visualization of the proximal femur for broaching.  The femur was then sequentially broached up to an appropriate size which provided for good fill and stability to the femoral broach.  A trial neck and head were placed on the femoral broach and the leg was brought up for reduction.  The hip was reduced and manual check of stability was performed.  The hip was found to be stable in flexion internal rotation and extension external rotation.  Leg lengths were confirmed on fluoroscopy.   The hip was then dislocated the trial neck and head were removed.  The leg was then brought down into extension and adduction in the proximal femur was reexposed.  The broach trial was removed and the femur was irrigated with normal saline prior to the real femoral stem being implanted.  After the femoral stem was seated and shown to have good fit and fill the appropriate head was impacted the leg was brought up and reduced.  There was good range of motion with stability in flexion internal rotation and extension external rotation on testing.  Leg lengths were found to be appropriate on fluoroscopic evaluation at this time.  The hip was then irrigated with betdine based surgiphor solution and then saline solution.  The capsulotomy was repaired with Ethibond sutures.  A pericapsular and peritrochanteric cocktail with Exparel and bupivacaine was then injected as well  as the subcutaneous tissues. The fascia was closed with a #1 barbed running suture.  The deep tissues were closed with Vicryl sutures the subcutaneous tissues were closed with interrupted Vicryl sutures and a running barbed 4-0 suture.  The skin was then reinforced with Dermabond and a sterile dressing was placed.   The patient was awoken from anesthesia transferred off of the operating room table onto a hospital bed where examination of leg lengths found the leg lengths to be equal with a good distal pulse.  The patient was then transferred to the PACU in stable condition.

## 2023-10-09 NOTE — Interval H&P Note (Signed)
 Patient history and physical updated. Consent reviewed including risks, benefits, and alternatives to surgery. Patient agrees with above plan to proceed with right anterior total hip arthroplasty.

## 2023-10-09 NOTE — Transfer of Care (Signed)
 Immediate Anesthesia Transfer of Care Note  Patient: Audrey Martinez  Procedure(s) Performed: ARTHROPLASTY, HIP, TOTAL, ANTERIOR APPROACH (Right: Hip)  Patient Location: PACU  Anesthesia Type:Spinal  Level of Consciousness: drowsy and patient cooperative  Airway & Oxygen Therapy: Patient Spontanous Breathing and Patient connected to face mask oxygen  Post-op Assessment: Report given to RN and Post -op Vital signs reviewed and stable  Post vital signs: Reviewed and stable  Last Vitals:  Vitals Value Taken Time  BP 105/53 10/09/23 1303  Temp    Pulse 91 10/09/23 1307  Resp 15 10/09/23 1307  SpO2 100 % 10/09/23 1307  Vitals shown include unfiled device data.  Last Pain:  Vitals:   10/09/23 0839  TempSrc: Temporal  PainSc: 0-No pain         Complications: No notable events documented.

## 2023-10-09 NOTE — Discharge Instructions (Signed)
 [facility] They will call you to set up when they are coming out to see you   [address], [place], [place] [address] Hours:  Open ? Closes 5?PM Phone: [phone]        Instructions after Total Knee Replacement   Reinaldo Berber M.D.     Dept. of Orthopaedics & Sports Medicine  [facility]  [address]  [place], [place]  [address]  Phone: [phone]   Fax: [phone]    DIET: Drink plenty of non-alcoholic fluids. Resume your normal diet. Include foods high in fiber.  ACTIVITY:  You may use crutches or a walker with weight-bearing as tolerated, unless instructed otherwise. You may be weaned off of the walker or crutches by your Physical Therapist.  Do NOT place pillows under the knee. Anything placed under the knee could limit your ability to straighten the knee.   Continue doing gentle exercises. Exercising will reduce the pain and swelling, increase motion, and prevent muscle weakness.   Please continue to use the TED compression stockings for 2 weeks. You may remove the stockings at night, but should reapply them in the morning. Do not drive or operate any equipment until instructed.  WOUND CARE:  Continue to use the PolarCare or ice packs periodically to reduce pain and swelling. You may begin showering 3 days after surgery with honeycomb dressing. Remove honeycomb dressing 7 days after surgery and continue showering. Allow dermabond to fall off on its own.  MEDICATIONS: You may resume your regular medications. Please take the pain medication as prescribed on the medication. Do not take pain medication on an empty stomach. You have been given a prescription for a blood thinner (Lovenox or Coumadin). Please take the medication as instructed. (NOTE: After completing a 2 week course of Lovenox, take one 81 mg Enteric-coated aspirin twice a day for 3 additional weeks. This along with elevation will help reduce the possibility of phlebitis in your operated  leg.) Do not drive or drink alcoholic beverages when taking pain medications.  POSTOPERATIVE CONSTIPATION PROTOCOL Constipation - defined medically as fewer than three stools per week and severe constipation as less than one stool per week.  One of the most common issues patients have following surgery is constipation.  Even if you have a regular bowel pattern at home, your normal regimen is likely to be disrupted due to multiple reasons following surgery.  Combination of anesthesia, postoperative narcotics, change in appetite and fluid intake all can affect your bowels.  In order to avoid complications following surgery, here are some recommendations in order to help you during your recovery period.  Colace (docusate) - Pick up an over-the-counter form of Colace or another stool softener and take twice a day as long as you are requiring postoperative pain medications.  Take with a full glass of water daily.  If you experience loose stools or diarrhea, hold the colace until you stool forms back up.  If your symptoms do not get better within 1 week or if they get worse, check with your doctor.  Dulcolax (bisacodyl) - Pick up over-the-counter and take as directed by the product packaging as needed to assist with the movement of your bowels.  Take with a full glass of water.  Use this product as needed if not relieved by Colace only.   MiraLax (polyethylene glycol) - Pick up over-the-counter to have on hand.  MiraLax is a solution that will increase the amount of water in your bowels to assist with  bowel movements.  Take as directed and can mix with a glass of water, juice, soda, coffee, or tea.  Take if you go more than two days without a movement. Do not use MiraLax more than once per day. Call your doctor if you are still constipated or irregular after using this medication for 7 days in a row.  If you continue to have problems with postoperative constipation, please contact the office for further  assistance and recommendations.  If you experience "the worst abdominal pain ever" or develop nausea or vomiting, please contact the office immediatly for further recommendations for treatment.   CALL THE OFFICE FOR: Temperature above 101 degrees Excessive bleeding or drainage on the dressing. Excessive swelling, coldness, or paleness of the toes. Persistent nausea and vomiting.  FOLLOW-UP:  You should have an appointment to return to the office in 14 days after surgery. Arrangements have been made for continuation of Physical Therapy (either home therapy or outpatient therapy).

## 2023-10-09 NOTE — H&P (Signed)
 History of Present Illness: Audrey Martinez is an 78 y.o. female presents for follow evaluation of her right hip. The patient reports sharp throbbing and dull aching pains in her lateral groin and posterior lateral hip. She reports they are up to a 9 out of 10. She is scheduled to get a another injection in the hip joint and the bursa and decided to hold off on any additional injections as she wanted to discuss her pain and the possibility of surgery. She reports pain is significantly worse when walking and is limiting her activities requiring use of a rolling walker. She denies any radiation of pain down the leg. Denies any numbness. The patient denies fevers, chills, numbness, tingling, shortness of breath, chest pain, recent illness, or any trauma.  Patient is a non-smoker, nondiabetic with an A1c of 5.1 MI 39.9  Past Medical History: Past Medical History:  Diagnosis Date  Asthma without status asthmaticus (HHS-HCC)  Neuropathy  right arm, due to injury & surgery  Subdural hematoma (CMS/HHS-HCC) 2022  SVT (supraventricular tachycardia) (CMS/HHS-HCC)  Tachycardia, unspecified   Past Surgical History: Past Surgical History:  Procedure Laterality Date  TONSILLECTOMY & ADENOIDECTOMY 1959  UMBILICAL HERNIA REPAIR 2001  DEEP NECK LYMPH NODE BIOPSY / EXCISION 2002  heart ablation 2009  x2  REPLACEMENT TOTAL KNEE Right 2012  lumbar decompression L3-5 12/2015  Nola Battiest ([place] )  Lenette Quick procedure Bilateral  Right shoulder surgery Right 2013-2014  x 2   Past Family History: History reviewed. No pertinent family history.  Medications: Current Outpatient Medications  Medication Sig Dispense Refill  ENTRESTO  24-26 mg tablet Take 1 tablet by mouth 2 (two) times daily  HYDROcodone -acetaminophen  (NORCO) 5-325 mg tablet TAKE 1 TABLET BY MOUTH EVERY 6 HOURS AS NEEDED FOR 5 DAYS  baclofen  (LIORESAL ) 10 MG tablet Take by mouth  C/sourcherry/celery/grape seed (TART CHERRY ORAL) Take by  mouth  carvediloL  (COREG ) 6.25 MG tablet Take 6.25 mg by mouth 2 (two) times daily with meals  cholecalciferol  (VITAMIN D3) 1,000 unit tablet Take 1,000 Units by mouth once daily  cholecalciferol  (VITAMIN D3) 400 unit tablet Take 1 tablet by mouth once daily  cranberry 500 mg Cap Take 1,000 mg by mouth once daily  diltiazem  (CARDIZEM  CD) 120 MG XR capsule Take by mouth  ezetimibe  (ZETIA ) 10 mg tablet Take 1 tablet by mouth once daily  famotidine  (PEPCID ) 20 MG tablet Take 20 mg by mouth  FLOVENT  HFA 110 mcg/actuation inhaler Take 2 inhalations by mouth 2 (two) times daily 2  gabapentin  (NEURONTIN ) 300 MG capsule Take by mouth  ginger, Zingiber officinalis, 500 mg Cap Take by mouth  loratadine (CLARITIN) 10 mg tablet Take 10 mg by mouth once daily  meloxicam  (MOBIC ) 15 MG tablet Take 1 tablet by mouth once daily as needed  multivitamin with minerals tablet Take by mouth  PROVENTIL  HFA 90 mcg/actuation inhaler  STRIVERDI RESPIMAT  2.5 mcg/actuation Mist Inhale 2 inhalations into the lungs once daily 3  turmeric root extract 500 mg Cap Take 500 mg by mouth once daily  UNABLE TO FIND Med Name: Bone up Jarrow  vitamin E, dl,tocopheryl acet, (VITAMIN E, DL, ACETATE,) 100 unit capsule Take by mouth  XARELTO  10 mg tablet Take 10 mg by mouth once daily   No current facility-administered medications for this visit.   Allergies: Allergies  Allergen Reactions  Amoxicillin Hives, Shortness Of Breath and Swelling  Did it involve swelling of the face/tongue/throat, SOB, or low BP? Yes Did it involve sudden or severe rash/hives, skin  peeling, or any reaction on the inside of your mouth or nose? Yes Did you need to seek medical attention at a hospital or doctor's office? Yes When did it last happen? 1998  If all above answers are "NO", may proceed with cephalosporin use.  Aspirin Anaphylaxis  Ciprofloxacin Rash  Ciprofloxacin-Dexamethasone  Anaphylaxis  Morphine Nausea and Nausea And Vomiting   Morpholine Salicylate Nausea  Neomycin-Bacitracin-Poly-Hc Rash  Neomycin-Bacitracin-Polymyxin Rash  Opioids - Morphine Analogues Nausea  Rosuvastatin  Other (See Comments)  Caused problems with memory.  Cephalexin Hives  Doxycycline Hyclate Hives  Hydrochlorothiazide Other (See Comments)  Raises her blood pressure.   Lovastatin Other (See Comments)  "Unable to function"  Simvastatin Other (See Comments)  "Unable to function"  Erythromycin Hives  Ibuprofen Other (See Comments)  Sharp stomach pains  Adhesive Tape-Silicones Hives and Rash  Neomycin-Bacitracnzn-Polymyxnb Hives and Rash  Omeprazole Palpitations  Tachycardia and same sensation as her SVTs per patient    Visit Vitals: Vitals:  09/22/23 1113  BP: 116/70    Review of Systems:  A comprehensive 14 point ROS was performed, reviewed, and the pertinent orthopaedic findings are documented in the HPI.  Physical Exam: Body mass index is 39.99 kg/m. General/Constitutional: No apparent distress: well-nourished and well developed. Lymphatic: No palpable adenopathy. Pulmonary exam: Lungs clear to auscultation bilaterally no wheezing rales or rhonchi Cardiac exam: Regular rate and rhythm no obvious murmurs rubs or gallops. Vascular: No edema, swelling or tenderness, except as noted in detailed exam. Integumentary: No impressive skin lesions present, except as noted in detailed exam. Neuro/Psych: Normal mood and affect, oriented to person, place and time. Musculoskeletal: Normal, except as noted in detailed exam and in HPI.  Right hip exam  SKIN: intact SWELLING: none WARMTH: no warmth TENDERNESS: Mild tenderness over the posterior lateral hip reproducing some of her pain, Stinchfield Positive ROM: 0 degrees internal rotation and 30 degrees external rotation and pain with internal rotation and hip flexion localized to the lateral groin and posterior lateral hip,; Hip Flexion 95 STRENGTH: limited by pain GAIT:  antalgic with a rolling walker STABILITY: stable to testing CREPITUS: yes LEG LENGTH DISCREPANCY: left longer by .2 cm NEUROLOGICAL EXAM: normal VASCULAR EXAM: normal LUMBAR SPINE: tenderness: no straight leg raising sign: no motor exam: normal  The contralateral hip was examined for comparison and it showed: TENDERNESS: none ROM: normal and full STRENGTH: normal STABILITY: stable to testing  Hip Imaging :  Expand All Collapse All  History of Present Illness: Audrey Martinez is an 78 y.o. female presents for evaluation of her right hip. The patient has had over a year now of lateral right hip pain described as a stabbing shooting pain up to a 10 out of 10 at its worst and up to an 8 out of 10 today. She is continue to take meloxicam  for it with some relief. She underwent a bursa injection over the right hip by myself 2 months ago and got a month of nearly complete relief of her pain prior to the pain coming back over the lateral hip. She denies any groin pain or any pain with standing or walking on flat ground she reports the pain is worse when making a sudden turn or going up and down stairs. She reports it is functionally limiting on a daily basis. The patient denies fevers, chills, numbness, tingling, shortness of breath, chest pain, recent illness, or any trauma.  The patient is no longer taking Xarelto  and her medical and cardiology doctors are both aware per  patient report.   Past Medical History: Past Medical History   Past Medical History:  Diagnosis Date  Asthma without status asthmaticus (HHS-HCC)  Neuropathy  right arm, due to injury & surgery  Subdural hematoma (CMS/HHS-HCC) 2022  SVT (supraventricular tachycardia) (CMS/HHS-HCC)  Tachycardia, unspecified     Past Surgical History: Past Surgical History   Past Surgical History:  Procedure Laterality Date  TONSILLECTOMY & ADENOIDECTOMY 1959  UMBILICAL HERNIA REPAIR 2001  DEEP NECK LYMPH NODE BIOPSY / EXCISION  2002  heart ablation 2009  x2  REPLACEMENT TOTAL KNEE Right 2012  lumbar decompression L3-5 12/2015  Nola Battiest ([place] )  Lenette Quick procedure Bilateral  Right shoulder surgery Right 2013-2014  x 2     Past Family History: Family History  No family history on file.    Medications: Current Medications   Current Outpatient Medications  Medication Sig Dispense Refill  baclofen  (LIORESAL ) 10 MG tablet Take by mouth  C/sourcherry/celery/grape seed (TART CHERRY ORAL) Take by mouth  carvediloL  (COREG ) 6.25 MG tablet Take 6.25 mg by mouth 2 (two) times daily with meals  cholecalciferol  (VITAMIN D3) 1,000 unit tablet Take 1,000 Units by mouth once daily  cholecalciferol  (VITAMIN D3) 400 unit tablet Take 1 tablet by mouth once daily  cranberry 500 mg Cap Take 1,000 mg by mouth once daily  diltiazem  (CARDIZEM  CD) 120 MG XR capsule Take by mouth  ezetimibe  (ZETIA ) 10 mg tablet Take 1 tablet by mouth once daily  famotidine  (PEPCID ) 20 MG tablet Take 20 mg by mouth  FLOVENT  HFA 110 mcg/actuation inhaler Take 2 inhalations by mouth 2 (two) times daily 2  gabapentin  (NEURONTIN ) 300 MG capsule Take by mouth  ginger, Zingiber officinalis, 500 mg Cap Take by mouth  HYDROcodone -acetaminophen  (NORCO) 5-325 mg tablet Take 1 tablet by mouth every 6 (six) hours as needed 20 tablet 0  loratadine (CLARITIN) 10 mg tablet Take 10 mg by mouth once daily  meloxicam  (MOBIC ) 15 MG tablet Take 1 tablet by mouth once daily as needed  multivitamin with minerals tablet Take by mouth  PROVENTIL  HFA 90 mcg/actuation inhaler  STRIVERDI RESPIMAT  2.5 mcg/actuation Mist Inhale 2 inhalations into the lungs once daily 3  turmeric root extract 500 mg Cap Take 500 mg by mouth once daily  UNABLE TO FIND Med Name: Bone up Jarrow  vitamin E, dl,tocopheryl acet, (VITAMIN E, DL, ACETATE,) 100 unit capsule Take by mouth  XARELTO  10 mg tablet Take 10 mg by mouth once daily   No current facility-administered medications  for this visit.     Allergies: Allergies   Allergies  Allergen Reactions  Amoxicillin Hives, Shortness Of Breath and Swelling  Did it involve swelling of the face/tongue/throat, SOB, or low BP? Yes Did it involve sudden or severe rash/hives, skin peeling, or any reaction on the inside of your mouth or nose? Yes Did you need to seek medical attention at a hospital or doctor's office? Yes When did it last happen? 1998  If all above answers are "NO", may proceed with cephalosporin use.  Aspirin Anaphylaxis  Ciprofloxacin Rash  Ciprofloxacin-Dexamethasone  Anaphylaxis  Morphine Nausea and Nausea And Vomiting  Morpholine Salicylate Nausea  Neomycin-Bacitracin-Poly-Hc Rash  Neomycin-Bacitracin-Polymyxin Rash  Opioids - Morphine Analogues Nausea  Rosuvastatin  Other (See Comments)  Caused problems with memory.  Cephalexin Hives  Doxycycline Hyclate Hives  Hydrochlorothiazide Other (See Comments)  Raises her blood pressure.   Lovastatin Other (See Comments)  "Unable to function"  Simvastatin Other (See Comments)  "Unable to function"  Erythromycin Hives  Ibuprofen Other (See Comments)  Sharp stomach pains  Adhesive Tape-Silicones Hives and Rash  Neomycin-Bacitracnzn-Polymyxnb Hives and Rash  Omeprazole Palpitations  Tachycardia and same sensation as her SVTs per patient     Visit Vitals:  Vitals:  06/20/23 1537  BP: 118/82    Review of Systems:  A comprehensive 14 point ROS was performed, reviewed, and the pertinent orthopaedic findings are documented in the HPI.  Physical Exam: Body mass index is 39.99 kg/m. General/Constitutional: No apparent distress: well-nourished and well developed. Lymphatic: No palpable adenopathy. Respiratory: Non-labored breathing Vascular: No edema, swelling or tenderness, except as noted in detailed exam. Integumentary: No impressive skin lesions present, except as noted in detailed exam. Neuro/Psych: Normal mood and affect, oriented  to person, place and time. Musculoskeletal: Normal, except as noted in detailed exam and in HPI.  Right hip exam  SKIN: intact SWELLING: none WARMTH: no warmth TENDERNESS: Tender over the lateral aspect of the greater trochanter which reproduces a significant percentage of the patient's pain, Stinchfield Positive ROM: 10 degrees internal rotation and 30 degrees external rotation and pain with internal rotation and hip flexion localized laterally but less pain compared to palpation reproduced pain; Hip Flexion 100 STRENGTH: limited by pain GAIT: antalgic with a cane STABILITY: stable to testing CREPITUS: yes LEG LENGTH DISCREPANCY: none NEUROLOGICAL EXAM: normal VASCULAR EXAM: normal LUMBAR SPINE: tenderness: no straight leg raising sign: no motor exam: normal  The contralateral hip was examined for comparison and it showed: TENDERNESS: none ROM: normal and full STRENGTH: normal STABILITY: stable to testing  Hip Imaging :  I have reviewed AP pelvis and lateral hip X-rays (3 views) taken at the last office visit of the right hip which reveal severe degenerative changes with joint space narrowing and osteophyte formation, subchondral cysts and sclerosis of the right hip. The left hip is noted to have mild to moderate degenerative change with some joint space narrowing superior acetabular sclerosis and small osteophyte. No fractures or dislocations noted. Partially visualized lumbosacral spine shows degenerative changes with disc height loss.   Assessment:  Right hip osteoarthritis  Plan: Sinaya is a 78 year old female presents with right hip bone on bone arthritis. Her pain profile has become more significant to the joint itself over the last few months and less over the bursa. We discussed additional treatment options and the possibility of continuing conservative treatments for the bursa and additional injections. She is quite stiff and painful in the hip with crepitus on exam. Based  upon the patient's continued symptoms and failure to respond to conservative treatment, I have recommended a right total hip replacement for this patient. A long discussion took place with the patient describing what a total joint replacement is and what the procedure would entail. A hip model, similar to the implants that will be used during the operation, was utilized to demonstrate the implants. Choices of implant manufactures were discussed and reviewed. The ability to secure the implant utilizing cement or cementless (press fit) fixation was discussed. Anterior and posterior exposures were discussed. For this patient an appropriate approach will be anterior. We did discuss that we will not be able to directly address the bursa via this approach but there is a good chance that the bursitis should calm down after the hip is fixed. Should she continue to have bursitis symptoms postoperatively we may consider additional injections or a bursectomy at that time. She would like to proceed with anterior approach however to have a shorter recovery from the hip replacement  itself.  The hospitalization and post-operative care and rehabilitation were also discussed. The use of perioperative antibiotics and DVT prophylaxis were discussed. The risk, benefits and alternatives to a surgical intervention were discussed at length with the patient. The patient was also advised of risks related to the medical comorbidities and elevated body mass index (BMI). A lengthy discussion took place to review the most common complications including but not limited to: deep vein thrombosis, pulmonary embolus, heart attack, stroke, infection, wound breakdown, heterotopic ossification, dislocation, numbness, leg length in-equality, intraoperative fracture, damage to nerves, tendon,muscles, arteries or other blood vessels, death and other possible complications from anesthesia. The patient was told that we will take steps to minimize these  risks by using sterile technique, antibiotics and DVT prophylaxis when appropriate and follow the patient postoperatively in the office setting to monitor progress. The possibility of recurrent pain, no improvement in pain and actual worsening of pain were also discussed with the patient. The risk of dislocation following total hip replacement was discussed and potential precautions to prevent dislocation were reviewed.   Patient asked about and confirms no history of any reactions to metal or metal allergy in the past.  The discharge plan of care focused on the patient going home following surgery. The patient was encouraged to make the necessary arrangements to have someone stay with them when they are discharged home.   The benefits of surgery were discussed with the patient including the potential for improving the patient's current clinical condition through operative intervention. Alternatives to surgical intervention including continued conservative management were also discussed in detail. All questions were answered to the satisfaction of the patient. The patient participated and agreed to the plan of care as well as the use of the recommended implants for their total hip replacement surgery. An information packet was given to the patient to review prior to surgery.   Patient received medical and cardiac clearance for surgery. All questions answered patient reasonable plan appropriations for right anterior total hip replacement.  Portions of this record have been created using Scientist, clinical (histocompatibility and immunogenetics). Dictation errors have been sought, but may not have been identified and corrected.  Venus Ginsberg MD

## 2023-10-09 NOTE — Anesthesia Preprocedure Evaluation (Addendum)
 Anesthesia Evaluation  Patient identified by MRN, date of birth, ID band Patient awake    Reviewed: Allergy & Precautions, H&P , NPO status , Patient's Chart, lab work & pertinent test results, reviewed documented beta blocker date and time   History of Anesthesia Complications Negative for: history of anesthetic complications  Airway Mallampati: II   Neck ROM: full    Dental  (+) Poor Dentition, Dental Advidsory Given, Caps   Pulmonary neg shortness of breath, asthma , pneumonia, neg COPD, neg recent URI   Pulmonary exam normal        Cardiovascular Exercise Tolerance: Poor hypertension, (-) angina + CAD  (-) Past MI, (-) Cardiac Stents and (-) CABG + dysrhythmias Atrial Fibrillation (-) Valvular Problems/Murmurs Rhythm:regular Rate:Normal  ECHO 1/25: 1. Left ventricular ejection fraction, by estimation, is 50 to 55%. The left ventricle has low normal function. Left ventricular endocardial border not optimally defined to evaluate regional wall motion. There is mild left ventricular hypertrophy. Left ventricular diastolic parameters are consistent with Grade I diastolic dysfunction (impaired relaxation).   2. Right ventricular systolic function is normal. The right ventricular size is normal. Tricuspid regurgitation signal is inadequate for assessing PA pressure.   3. The mitral valve is normal in structure. Mild mitral valve regurgitation. No evidence of mitral stenosis.   4. The aortic valve is normal in structure. Aortic valve regurgitation is mild. Aortic valve sclerosis is present, with no evidence of aortic valve stenosis.   5. The inferior vena cava is normal in size with greater than 50% respiratory variability, suggesting right atrial pressure of 3 mmHg.   6. Agitated saline contrast bubble study was negative, with no evidence of any interatrial shunt.    Neuro/Psych neg Seizures PSYCHIATRIC DISORDERS     Dementia TIA  Neuromuscular disease CVA, No Residual Symptoms    GI/Hepatic negative GI ROS, Neg liver ROS,,,  Endo/Other  negative endocrine ROS    Renal/GU CRFRenal disease (stage 3a)  negative genitourinary   Musculoskeletal   Abdominal   Peds  Hematology negative hematology ROS (+)   Anesthesia Other Findings Past Medical History: No date: Actinic keratosis 01/29/2020: Acute pulmonary embolus (HCC) 01/31/2020: Acute renal failure (ARF) (HCC) 01/29/2020: Acute respiratory failure with hypoxia (HCC) No date: Acute septic pulmonary embolism (HCC) No date: AKI (acute kidney injury) (HCC) 12/2018: Amaurosis fugax, right eye No date: Arthritis No date: Asthma 07/2016: Basal cell carcinoma     Comment:  central forehead (tx in [place] ) No date: Cardiomyopathy (HCC)     Comment:  a. 04/2018 Echo: EF 45-50%. Anteroseptal and apical HK               in some views. Mild AI/MR. Nl RV fxn; b. 05/2018 MV:               mid-dist ant and antsept mild defect - ? breast atten. EF              41%. No ischemia; c. 03/2019 Echo: EF 30-35%, Gr2 DD,               Trace MR, triv TR. Asc Ao ectatic dil - 38mm.  No date: Coronary artery disease No date: Dyspnea No date: Dysrhythmia No date: GERD (gastroesophageal reflux disease) No date: Herniated disc, lumbar No date: Hyperlipidemia No date: Left wrist fracture 05/12/2020: Melanoma (HCC)     Comment:  Melanoma IS R post neck, excised 08/10/20 No date: Osteoporosis 01/26/2020: Pneumonia due to COVID-19 virus 1998: Stroke (HCC)  No date: SVT (supraventricular tachycardia) (HCC)     Comment:  a.2008 s/p RFCA for AVNRT; b. 9 & 02/2019 Zio x 2: 1.               Avg HR 89, 3 runs NSVT (longest 7 beats), Mobitz 1. 2.               Avg HR 89, NSVT x 1 (5 beats), Mobitz 1. No signif               arrhythmia. Past Surgical History: 2017: BACK SURGERY     Comment:  L2-4 laminectomy and foraminal stenosis 1977: BREAST CYST ASPIRATION; Left 2009: CARDIAC  CATHETERIZATION     Comment:  [facility] [place] [facility] [place]: CARDIAC ELECTROPHYSIOLOGY STUDY AND ABLATION 2010: CATARACT EXTRACTION, BILATERAL 03/21/2018: COLONOSCOPY WITH PROPOFOL ; N/A     Comment:  Procedure: COLONOSCOPY WITH Biopsy;  Surgeon: Selena Daily, MD;  Location: [facility];                Service: Endoscopy;  Laterality: N/A; No date: CORONARY ANGIOPLASTY 1977-78: derrick procedure     Comment:  bilateral  2013: ESOPHAGOGASTRODUODENOSCOPY     Comment:  gastritis; done for complaint of dysphagia 03/21/2018: ESOPHAGOGASTRODUODENOSCOPY (EGD) WITH PROPOFOL ; N/A     Comment:  Procedure: ESOPHAGOGASTRODUODENOSCOPY (EGD) WITH               Biopsies;  Surgeon: Selena Daily, MD;  Location:               [facility];  Service: Endoscopy;  Laterality:               N/A;  KEEP THIS PATIENT FIRST No date: JOINT REPLACEMENT 05/02/2019: LEFT HEART CATH AND CORONARY ANGIOGRAPHY; Left     Comment:  Procedure: LEFT HEART CATH AND CORONARY ANGIOGRAPHY;                Surgeon: Devorah Fonder, MD;  Location: [facility] INVASIVE               CV LAB;  Service: Cardiovascular;  Laterality: Left; No date: lymph node removal     Comment:  neck 07/2016: MOHS SURGERY     Comment:  BCCA nose 03/21/2018: POLYPECTOMY; N/A     Comment:  Procedure: POLYPECTOMY;  Surgeon: Selena Daily,               MD;  Location: [facility];  Service: Endoscopy;               Laterality: N/A; 2013: REPLACEMENT TOTAL KNEE     Comment:  RT 2012: TOTAL SHOULDER REPLACEMENT     Comment:  RT  BMI    Body Mass Index: 39.38 kg/m     Reproductive/Obstetrics negative OB ROS                             Anesthesia Physical Anesthesia Plan  ASA: 3  Anesthesia Plan: Spinal   Post-op Pain Management:    Induction: Intravenous  PONV Risk Score and Plan: 3 and Propofol  infusion and TIVA  Airway Management Planned:  Natural Airway and Simple Face Mask  Additional Equipment:   Intra-op Plan:   Post-operative Plan:   Informed Consent: I have reviewed the patients History and Physical, chart, labs and discussed the procedure including  the risks, benefits and alternatives for the proposed anesthesia with the patient or authorized representative who has indicated his/her understanding and acceptance.     Dental Advisory Given  Plan Discussed with: CRNA  Anesthesia Plan Comments:         Anesthesia Quick Evaluation

## 2023-10-09 NOTE — Anesthesia Procedure Notes (Signed)
 Spinal  Start time: 10/09/2023 10:37 AM End time: 10/09/2023 10:47 AM Staffing Performed: resident/CRNA  Resident/CRNA: Noelia Batman, CRNA Performed by: Noelia Batman, CRNA Authorized by: Vanice Genre, MD   Preanesthetic Checklist Completed: patient identified, IV checked, site marked, risks and benefits discussed, surgical consent, monitors and equipment checked, pre-op evaluation and timeout performed Spinal Block Patient position: sitting Prep: ChloraPrep Patient monitoring: heart rate, continuous pulse ox and blood pressure Approach: midline Location: L3-4 Injection technique: single-shot Needle Needle type: Pencan  Needle gauge: 24 G Needle length: 10 cm Needle insertion depth: 8 cm Assessment Sensory level: T10 Events: CSF return Additional Notes Sterile aseptic technique used throughout the procedure. Negative parasthesia. Negative blood return. Positive free flowing CSF. Expiration date of kit checked and confirmed. Patient tolerated procedure well without complications.

## 2023-10-09 NOTE — Evaluation (Signed)
 Physical Therapy Evaluation Patient Details Name: Audrey Martinez MRN: [id number] DOB: Feb 28, 1946 Today's Date: 10/09/2023  History of Present Illness  Pt is a 78 yo female diagnosed with right hip OA and is s/p elective R THA.  PMH includes: SDH, asthma, SVT, R TKA, HTN, CAD, and A-fib.  Clinical Impression  Pt was pleasant and motivated to participate during the session and put forth good effort throughout. Pt required extra time, effort, and occasional cuing for proper sequencing with functional tasks but required no physical assistance during the session.  Pt presented with good sitting and standing balance with no overt LOB noted.  Pt reported no adverse symptoms during the session with SpO2 and HR WNL throughout on room air with nursing notified that pt left on room air at the end of the session with SpO2 97%.  Pt will benefit from continued PT services upon discharge to safely address deficits listed in patient problem list for decreased caregiver assistance and eventual return to PLOF.           If plan is discharge home, recommend the following: A little help with walking and/or transfers;A little help with bathing/dressing/bathroom;Assistance with cooking/housework;Direct supervision/assist for medications management;Assist for transportation;Help with stairs or ramp for entrance   Can travel by private vehicle        Equipment Recommendations Rolling walker (2 wheels);BSC/3in1  Recommendations for Other Services       Functional Status Assessment Patient has had a recent decline in their functional status and demonstrates the ability to make significant improvements in function in a reasonable and predictable amount of time.     Precautions / Restrictions Precautions Precautions: Fall;Anterior Hip Precaution Booklet Issued: Yes (comment) Restrictions Weight Bearing Restrictions Per Provider Order: Yes RLE Weight Bearing Per Provider Order: Weight bearing as tolerated       Mobility  Bed Mobility Overal bed mobility: Needs Assistance Bed Mobility: Supine to Sit     Supine to sit: Supervision     General bed mobility comments: Min verbal cues for sequencing but no physical assist needed    Transfers Overall transfer level: Needs assistance Equipment used: Rolling walker (2 wheels) Transfers: Sit to/from Stand Sit to Stand: Contact guard assist           General transfer comment: Min verbal cues for hand placement and increased trunk flexion with good stability throughout and no physical assist needed to come to standing    Ambulation/Gait Ambulation/Gait assistance: Contact guard assist Gait Distance (Feet): 10 Feet Assistive device: Rolling walker (2 wheels) Gait Pattern/deviations: Step-to pattern, Decreased step length - left, Decreased stance time - right Gait velocity: decreased     General Gait Details: Pt able to take steps forwards/backwards several times near the EOB and then ambulate from bed to chair with no overt LOB or RLE buckling noted; step-to pattern training provided visually and then with verbal cuing during practice for initial gait assessment  Stairs            Wheelchair Mobility     Tilt Bed    Modified Rankin (Stroke Patients Only)       Balance Overall balance assessment: Needs assistance   Sitting balance-Leahy Scale: Normal     Standing balance support: Bilateral upper extremity supported, During functional activity Standing balance-Leahy Scale: Good                               Pertinent Vitals/Pain  Pain Assessment Pain Assessment: No/denies pain    Home Living Family/patient expects to be discharged to:: Private residence Living Arrangements: Alone Available Help at Discharge: Family;Available PRN/intermittently;Friend(s) Type of Home: House Home Access: Stairs to enter Entrance Stairs-Rails: None Entrance Stairs-Number of Steps: threshold step   Home Layout:  One level Home Equipment: Rollator (4 wheels);Cane - single point;Hand held shower head;Standard Walker Additional Comments: Pt stated has two sons and church friends that may be able to alternate staying with pt 24/7    Prior Function Prior Level of Function : Independent/Modified Independent;Driving             Mobility Comments: Mod Ind amb with a SW limited community distances, no falls in the last six months ADLs Comments: Ind with ADLs, drives     Extremity/Trunk Assessment   Upper Extremity Assessment Upper Extremity Assessment: Overall WFL for tasks assessed    Lower Extremity Assessment Lower Extremity Assessment: RLE deficits/detail RLE Deficits / Details: BLE ankle/knee strength, AROM, and sensation to light touch grossly WNL with expected POD #0 R hip strength deficits RLE Sensation: WNL RLE Coordination: WNL       Communication   Communication Communication: No apparent difficulties    Cognition Arousal: Alert Behavior During Therapy: WFL for tasks assessed/performed   PT - Cognitive impairments: No apparent impairments                         Following commands: Intact       Cueing Cueing Techniques: Verbal cues     General Comments      Exercises Total Joint Exercises Long Arc Quad: AROM, Strengthening, Both, 10 reps Knee Flexion: AROM, Strengthening, Both, 10 reps Other Exercises Other Exercises: HEP provided per handout   Assessment/Plan    PT Assessment Patient needs continued PT services  PT Problem List Decreased strength;Decreased activity tolerance;Decreased balance;Decreased mobility;Decreased knowledge of use of DME;Pain       PT Treatment Interventions DME instruction;Gait training;Stair training;Functional mobility training;Therapeutic activities;Therapeutic exercise;Balance training;Patient/family education    PT Goals (Current goals can be found in the Care Plan section)  Acute Rehab PT Goals Patient Stated Goal:  To walk better PT Goal Formulation: With patient Time For Goal Achievement: 10/22/23 Potential to Achieve Goals: Good    Frequency BID     Co-evaluation               AM-PAC PT "6 Clicks" Mobility  Outcome Measure Help needed turning from your back to your side while in a flat bed without using bedrails?: A Little Help needed moving from lying on your back to sitting on the side of a flat bed without using bedrails?: A Little Help needed moving to and from a bed to a chair (including a wheelchair)?: A Little Help needed standing up from a chair using your arms (e.g., wheelchair or bedside chair)?: A Little Help needed to walk in hospital room?: A Little Help needed climbing 3-5 steps with a railing? : A Lot 6 Click Score: 17    End of Session Equipment Utilized During Treatment: Gait belt Activity Tolerance: Patient tolerated treatment well Patient left: in chair;with call bell/phone within reach Nurse Communication: Mobility status;Weight bearing status PT Visit Diagnosis: Other abnormalities of gait and mobility (R26.89);Muscle weakness (generalized) (M62.81);Pain Pain - Right/Left: Right Pain - part of body: Hip    Time: 2952-8413 PT Time Calculation (min) (ACUTE ONLY): 39 min   Charges:   PT Evaluation $PT Eval  Moderate Complexity: 1 Mod PT Treatments $Gait Training: 8-22 mins PT General Charges $$ ACUTE PT VISIT: 1 Visit       D. Madalyn Scarce PT, DPT 10/09/23, 5:18 PM

## 2023-10-09 NOTE — TOC Progression Note (Signed)
 Transition of Care [facility]) - Progression Note    Patient Details  Name: [name] MRN: [id number] Date of Birth: [birth year]  Transition of [facility] CM/SW Contact  Alexandra Ice, RN Phone Number: 10/09/2023, 12:08 PM  Clinical Narrative:      Patient set up with [facility] by surgeon's office prior to surgery, information added to AVS. TOC will continue to follow for any additional discharge needs.      Expected Discharge Plan and Services                                               Social Determinants of Health (SDOH) Interventions SDOH Screenings   Food Insecurity: No Food Insecurity (08/02/2023)   Received from [facility]  Housing: Low Risk  (08/02/2023)   Received from [facility]  Transportation Needs: No Transportation Needs (08/02/2023)   Received from [facility]  Utilities: Not At Risk (08/02/2023)   Received from [facility]  Alcohol Screen: Low Risk  (04/24/2023)  Depression (PHQ2-9): Low Risk  (07/06/2023)  Financial Resource Strain: Low Risk  (08/02/2023)   Received from [facility]  Physical Activity: Insufficiently Active (04/24/2023)  Social Connections: Moderately Isolated (04/24/2023)  Stress: No Stress Concern Present (04/24/2023)  Tobacco Use: Low Risk  (10/09/2023)  Health Literacy: Adequate Health Literacy (04/24/2023)    Readmission Risk Interventions     No data to display

## 2023-10-10 ENCOUNTER — Encounter: Payer: Self-pay | Admitting: Orthopedic Surgery

## 2023-10-10 LAB — CBC
HCT: 38.7 % (ref 36.0–46.0)
Hemoglobin: 12.8 g/dL (ref 12.0–15.0)
MCH: 30.5 pg (ref 26.0–34.0)
MCHC: 33.1 g/dL (ref 30.0–36.0)
MCV: 92.4 fL (ref 80.0–100.0)
Platelets: 169 10*3/uL (ref 150–400)
RBC: 4.19 MIL/uL (ref 3.87–5.11)
RDW: 11.7 % (ref 11.5–15.5)
WBC: 14.3 10*3/uL — ABNORMAL HIGH (ref 4.0–10.5)
nRBC: 0 % (ref 0.0–0.2)

## 2023-10-10 LAB — BASIC METABOLIC PANEL WITH GFR
Anion gap: 8 (ref 5–15)
BUN: 21 mg/dL (ref 8–23)
CO2: 25 mmol/L (ref 22–32)
Calcium: 9.3 mg/dL (ref 8.9–10.3)
Chloride: 107 mmol/L (ref 98–111)
Creatinine, Ser: 0.91 mg/dL (ref 0.44–1.00)
GFR, Estimated: >60 mL/min (ref >60)
Glucose, Bld: 122 mg/dL — ABNORMAL HIGH (ref 70–99)
Potassium: 4.3 mmol/L (ref 3.5–5.1)
Sodium: 140 mmol/L (ref 135–145)

## 2023-10-10 MED ORDER — GABAPENTIN 300 MG PO CAPS
ORAL_CAPSULE | ORAL | Status: AC
Start: 2023-10-10 — End: ?
  Filled 2023-10-10: qty 1

## 2023-10-10 MED ORDER — EZETIMIBE 10 MG PO TABS
ORAL_TABLET | ORAL | Status: AC
  Filled 2023-10-10: qty 1

## 2023-10-10 MED ORDER — GABAPENTIN 300 MG PO CAPS
ORAL_CAPSULE | ORAL | Status: AC
  Filled 2023-10-10: qty 1

## 2023-10-10 NOTE — NC FL2 (Signed)
 [place]  MEDICAID FL2 LEVEL OF CARE FORM     IDENTIFICATION  Patient Name: Audrey Martinez Birthdate: 1945-06-07 Sex: female Admission Date (Current Location): 10/09/2023  [facility] and [place] Number:  Chiropodist and Address:  [facility], [address], [place], [place] [address]      Provider Number: [id number]  Attending Physician Name and Address:  Venus Ginsberg, MD  Relative Name and Phone Number:  Melvis Pedley: [phone], Micah [place]: [phone]    Current Level of Care: Hospital Recommended Level of Care: Skilled Nursing Facility Prior Approval Number:    Date Approved/Denied:   PASRR Number: [id number]  Discharge Plan: SNF    Current Diagnoses: Patient Active Problem List   Diagnosis Date Noted   S/P total right hip arthroplasty 10/09/2023   Problem related to living alone 08/08/2023   TIA (transient ischemic attack) 06/25/2023   CAD (coronary artery disease) 06/25/2023   Chronic kidney disease, stage 3a (HCC) 06/25/2023   Age-related cognitive decline 06/15/2023   Encounter for long-term (current) use of non-steroidal anti-inflammatories 06/15/2023   Personal history of PE (pulmonary embolism) 06/15/2023   Cause of injury, accidental fall, subsequent encounter 06/15/2023   Problem related to health literacy 06/15/2023   Personal history of fall 06/15/2023   Osteoarthritis of right hip joint due to dysplasia 03/14/2023   Constipation 12/16/2022   HFrEF (heart failure with reduced ejection fraction) (HCC) 06/24/2022   Melanoma in situ of neck (HCC) 06/22/2021   Trochanteric bursitis of right hip 05/18/2021   Hx of colonic polyps    Atrial flutter (HCC) 01/31/2020   Obesity (BMI 30-39.9) 01/31/2020   Left lower lobe pulmonary nodule 01/29/2020   Dilated cardiomyopathy (HCC) 05/02/2019   Amaurosis fugax of right eye 01/13/2019   Lumbar radiculopathy 05/18/2017   Lumbar degenerative disc disease 05/18/2017    Spinal stenosis of lumbar region without neurogenic claudication 05/18/2017   Atrial tachycardia (HCC) 04/14/2017   SVT (supraventricular tachycardia) (HCC) 04/14/2017   Essential hypertension 04/14/2017   Intermittent asthma without complication 04/14/2017   Osteoporosis 03/21/2017   Benign essential tremor 03/21/2017   Hx of atrioventricular node ablation 02/01/2008   Mixed hyperlipidemia 05/12/2003    Orientation RESPIRATION BLADDER Height & Weight     Self, Time, Place, Situation  Normal Continent Weight: 86.5 kg Height:  5\' 2"  (157.5 cm)  BEHAVIORAL SYMPTOMS/MOOD NEUROLOGICAL BOWEL NUTRITION STATUS      Continent Diet (Regular diet, thin liquid)  AMBULATORY STATUS COMMUNICATION OF NEEDS Skin   Limited Assist Verbally Normal, Surgical wounds                       Personal Care Assistance Level of Assistance  Bathing, Feeding, Dressing Bathing Assistance: Limited assistance Feeding assistance: Limited assistance Dressing Assistance: Limited assistance     Functional Limitations Info             SPECIAL CARE FACTORS FREQUENCY  PT (By licensed PT), OT (By licensed OT)     PT Frequency: 5 times per week OT Frequency: 5 times per week            Contractures Contractures Info: Not present    Additional Factors Info  Code Status, Allergies Code Status Info: Full code Allergies Info: Amoxicillin, Aspirin, Bacitracin-polymyx-neo-hc, Ciprofloxacin, Ciprofloxacin-dexamethasone , crestor , erythromycin, hydrochlorothiazide, ibuprofen, morpholine salicylate, omeprazole, doxycycline hyclate, keflex, silicone, neomycin-bacitracin Zn-polymyx, tape, morphine and codeine, lovastatin           Current Medications (10/10/2023):  This  is the current hospital active medication list Current Facility-Administered Medications  Medication Dose Route Frequency Provider Last Rate Last Admin   0.9 %  sodium chloride  infusion   Intravenous Continuous Aberman, Zachary, MD        acetaminophen  (TYLENOL ) tablet 1,000 mg  1,000 mg Oral Q8H Aberman, Zachary, MD   1,000 mg at 10/10/23 9629   acetaminophen  (TYLENOL ) tablet 325-650 mg  325-650 mg Oral Q6H PRN Aberman, Zachary, MD       baclofen  (LIORESAL ) tablet 10 mg  10 mg Oral TID Aberman, Zachary, MD   10 mg at 10/10/23 1009   carvedilol  (COREG ) tablet 6.25 mg  6.25 mg Oral BID WC Aberman, Zachary, MD   6.25 mg at 10/10/23 5284   docusate sodium  (COLACE) capsule 100 mg  100 mg Oral BID Aberman, Zachary, MD   100 mg at 10/10/23 1009   enoxaparin  (LOVENOX ) injection 40 mg  40 mg Subcutaneous Q24H Aberman, Zachary, MD   40 mg at 10/10/23 1324   ezetimibe  (ZETIA ) tablet 10 mg  10 mg Oral Daily Aberman, Zachary, MD   10 mg at 10/10/23 1011   gabapentin  (NEURONTIN ) capsule 300 mg  300 mg Oral TID WC Chappell, Alex B, RPH   300 mg at 10/10/23 1237   And   gabapentin  (NEURONTIN ) capsule 900 mg  900 mg Oral QHS Chappell, Alex B, RPH   900 mg at 10/09/23 2131   HYDROcodone -acetaminophen  (NORCO/VICODIN) 5-325 MG per tablet 1-2 tablet  1-2 tablet Oral Q4H PRN Aberman, Zachary, MD   2 tablet at 10/09/23 2133   menthol-cetylpyridinium (CEPACOL) lozenge 3 mg  1 lozenge Oral PRN Aberman, Zachary, MD       Or   phenol (CHLORASEPTIC) mouth spray 1 spray  1 spray Mouth/Throat PRN Aberman, Zachary, MD       metoCLOPramide (REGLAN) tablet 5-10 mg  5-10 mg Oral Q8H PRN Aberman, Zachary, MD       Or   metoCLOPramide (REGLAN) injection 5-10 mg  5-10 mg Intravenous Q8H PRN Aberman, Zachary, MD       morphine (PF) 2 MG/ML injection 0.5-1 mg  0.5-1 mg Intravenous Q2H PRN Aberman, Zachary, MD       ondansetron  (ZOFRAN ) tablet 4 mg  4 mg Oral Q6H PRN Aberman, Zachary, MD       Or   ondansetron  (ZOFRAN ) injection 4 mg  4 mg Intravenous Q6H PRN Aberman, Zachary, MD       sacubitril-valsartan (ENTRESTO ) 24-26 mg per tablet  1 tablet Oral BID Aberman, Zachary, MD   1 tablet at 10/10/23 1010   spironolactone  (ALDACTONE ) tablet 12.5 mg  12.5 mg Oral  Daily Aberman, Zachary, MD   12.5 mg at 10/10/23 1010   traMADol  (ULTRAM ) tablet 50 mg  50 mg Oral Q6H PRN Aberman, Zachary, MD         Discharge Medications: Please see discharge summary for a list of discharge medications.  Relevant Imaging Results:  Relevant Lab Results:   Additional Information SSN: [id number]  Alexandra Ice, RN

## 2023-10-10 NOTE — Plan of Care (Signed)
?  Problem: Education: ?Goal: Knowledge of the prescribed therapeutic regimen will improve ?Outcome: Progressing ?  ?Problem: Activity: ?Goal: Ability to avoid complications of mobility impairment will improve ?Outcome: Progressing ?  ?Problem: Pain Management: ?Goal: Pain level will decrease with appropriate interventions ?Outcome: Progressing ?  ?Problem: Skin Integrity: ?Goal: Will show signs of wound healing ?Outcome: Progressing ?  ?

## 2023-10-10 NOTE — Progress Notes (Signed)
 Subjective: 1 Day Post-Op Procedure(s) (LRB): ARTHROPLASTY, HIP, TOTAL, ANTERIOR APPROACH (Right) Patient reports pain as moderate.   Patient is well, and has had no acute complaints or problems Denies any CP, SOB, ABD pain. We will continue therapy today.   Objective: Vital signs in last 24 hours: Temp:  [97.6 F (36.4 C)-98.1 F (36.7 C)] 98 F (36.7 C) (05/13 0416) Pulse Rate:  [50-98] 87 (05/13 0416) Resp:  [12-23] 18 (05/13 0416) BP: (97-142)/(44-81) 129/72 (05/13 0416) SpO2:  [94 %-100 %] 98 % (05/13 0416) Weight:  [86.5 kg] 86.5 kg (05/12 0839)  Intake/Output from previous day: 05/12 0701 - 05/13 0700 In: 1311.5 [I.V.:1000; IV Piggyback:311.5] Out: 950 [Urine:750; Blood:200] Intake/Output this shift: No intake/output data recorded.  Recent Labs    10/10/23 0628  HGB 12.8   Recent Labs    10/10/23 0628  WBC 14.3*  RBC 4.19  HCT 38.7  PLT 169   Recent Labs    10/10/23 0628  NA 140  K 4.3  CL 107  CO2 25  BUN 21  CREATININE 0.91  GLUCOSE 122*  CALCIUM  9.3   No results for input(s): "LABPT", "INR" in the last 72 hours.  EXAM General - Patient is Alert, Appropriate, and Oriented Extremity - Neurovascular intact Sensation intact distally Intact pulses distally Dorsiflexion/Plantar flexion intact Dressing - dressing C/D/I and no drainage Motor Function - intact, moving foot and toes well on exam.   Past Medical History:  Diagnosis Date   Actinic keratosis    Acute renal failure (ARF) (HCC) 01/31/2020   Acute respiratory failure with hypoxia (HCC) 01/29/2020   Amaurosis fugax, right eye 12/2018   Aortic atherosclerosis (HCC)    Arthritis    Asthma    Atrial tachycardia (HCC)    a.) s/p RFCA for AVNRT in 2008 at [facility]    Basal cell carcinoma 07/2016   central forehead (tx in [place] )   Bilateral pulmonary embolism (HCC) 01/28/2020   a.) in setting of SARS-CoV-2 infection (12/2019); noted BILATERALLY with  (+) associated RIGHT heart strain; Tx'd with rivaroxaban  Xarelto  20 mg daily x 6 months then PRN   Cerebellar infarct (RIGHT) 1998   Coronary artery disease    DDD (degenerative disc disease), cervical    Dyspnea    Facial laceration 06/16/2022   Date of injury: 06/08/2022  [facility] ED history below:  78 year old female presenting to the emergency department after mechanical, nonsyncopal fall causing her to strike her face on the edge/corner of a curio cabinet and sustaining a deep and approximate 8 cm laceration to the right cheek in an irregular pattern to the angle of the right lower jaw. Wound does not proceed th   GERD (gastroesophageal reflux disease)    Herniated disc, lumbar    HFrEF (heart failure with reduced ejection fraction) (HCC)    History of 2019 novel coronavirus disease (COVID-19) 12/2019   History of bilateral cataract extraction    Hyperlipidemia    Incomplete left bundle branch block (LBBB)    Left wrist fracture    Lumbar stenosis    s/p LEFT L2-4 hemilaminectomies and partial facetectomies, foraminotomies for lateral recess and foraminal decompression of L3 and L4 nerve roots   Melanoma (HCC) 05/12/2020   Melanoma IS R post neck, excised 08/10/20   Mobitz type 1 second degree atrioventricular block    a.) evaluated by EP --> paroxysmal in nature; nocturnal epidoe felt to be vagally mediated   NICM (nonischemic cardiomyopathy) (HCC)  a. 04/2018 Echo: EF 45-50%. Anteroseptal and apical HK in some views. Mild AI/MR. Nl RV fxn; b. 05/2018 MV: mid-dist ant and antsept mild defect - ? breast atten. EF 41%. No ischemia; c. 03/2019 Echo: EF 30-35%, Gr2 DD, Trace MR, triv TR. Asc Ao ectatic dil - 38mm.    Osteoporosis    Personal history of transient ischemic attack (TIA), and cerebral infarction without residual deficits 04/22/2003   Pneumonia due to COVID-19 virus 01/26/2020   SVT (supraventricular tachycardia) (HCC)    a.2008 s/p RFCA for AVNRT; b.  9 & 02/2019 Zio x 2: 1. Avg HR 89, 3 runs NSVT (longest 7 beats), Mobitz 1. 2. Avg HR 89, NSVT x 1 (5 beats), Mobitz 1. No signif arrhythmia.   Tendinitis of right knee 07/03/2017    Assessment/Plan:   1 Day Post-Op Procedure(s) (LRB): ARTHROPLASTY, HIP, TOTAL, ANTERIOR APPROACH (Right) Principal Problem:   S/P total right hip arthroplasty  Estimated body mass index is 34.88 kg/m as calculated from the following:   Height as of this encounter: 5\' 2"  (1.575 m).   Weight as of this encounter: 86.5 kg. Advance diet Up with therapy Pain controlled Labs and VSS Patient will most likely need SNF. CM to assist with discharge to SNF.  DVT Prophylaxis - Lovenox , TED hose, and SCDs Weight-Bearing as tolerated to right leg   T. Thomos Flies, PA-C [facility] 10/10/2023, 8:14 AM

## 2023-10-10 NOTE — Plan of Care (Signed)
 Progressing towards discharge

## 2023-10-10 NOTE — Progress Notes (Signed)
 Physical Therapy Treatment Patient Details Name: Audrey Martinez MRN: [id number] DOB: 05/06/1946 Today's Date: 10/10/2023   History of Present Illness Pt is a 78 yo female diagnosed with right hip OA and is s/p elective R THA.  PMH includes: SDH, asthma, SVT, R TKA, HTN, CAD, and A-fib.    PT Comments  Pt was pleasant and motivated to participate during the session and put forth good effort throughout. Pt required cuing and min A with bed mobility tasks and once in sitting at the EOB demonstrated good static sitting balance.  Pt required the bed to be elevated and cues for hand placement and increased trunk flexion to come to standing with extra time and effort but no physical assist needed.  Pt was able to amb 2 x 50 feet with antalgic step-through pattern but was generally steady with no overt LOB.  Pt reported no adverse symptoms during the session other than R hip pain with SpO2 and HR WNL throughout on room air.  Pt will benefit from continued PT services upon discharge to safely address deficits listed in patient problem list for decreased caregiver assistance and eventual return to PLOF.        If plan is discharge home, recommend the following: A little help with walking and/or transfers;A little help with bathing/dressing/bathroom;Assistance with cooking/housework;Direct supervision/assist for medications management;Assist for transportation;Help with stairs or ramp for entrance   Can travel by private vehicle     Yes  Equipment Recommendations  Rolling walker (2 wheels);BSC/3in1    Recommendations for Other Services       Precautions / Restrictions Precautions Precautions: Fall;Anterior Hip Precaution Booklet Issued: Yes (comment) Recall of Precautions/Restrictions: Impaired Restrictions Weight Bearing Restrictions Per Provider Order: Yes RLE Weight Bearing Per Provider Order: Weight bearing as tolerated     Mobility  Bed Mobility Overal bed mobility: Needs  Assistance Bed Mobility: Supine to Sit, Sit to Supine     Supine to sit: Supervision Sit to supine: Min assist   General bed mobility comments: Min A for RLE management during sit to sup with cues for general sequencing    Transfers Overall transfer level: Needs assistance Equipment used: Rolling walker (2 wheels) Transfers: Sit to/from Stand Sit to Stand: Contact guard assist           General transfer comment: Min verbal cues for hand placement and increased trunk flexion    Ambulation/Gait Ambulation/Gait assistance: Contact guard assist Gait Distance (Feet): 50 Feet Assistive device: Rolling walker (2 wheels) Gait Pattern/deviations: Step-to pattern, Decreased step length - left, Decreased stance time - right, Step-through pattern Gait velocity: decreased     General Gait Details: Slow cadence with step-through pattern but with decreased RLE stance time; pt generally steady with no overt LOB but with cues needed for general sequencing with the RW for safety   Stairs             Wheelchair Mobility     Tilt Bed    Modified Rankin (Stroke Patients Only)       Balance Overall balance assessment: Needs assistance Sitting-balance support: No upper extremity supported, Feet supported Sitting balance-Leahy Scale: Normal     Standing balance support: Bilateral upper extremity supported, During functional activity Standing balance-Leahy Scale: Good                              Communication Communication Communication: No apparent difficulties  Cognition Arousal: Alert Behavior During Therapy:  WFL for tasks assessed/performed   PT - Cognitive impairments: History of cognitive impairments, No family/caregiver present to determine baseline                         Following commands: Intact      Cueing Cueing Techniques: Verbal cues, Visual cues  Exercises Other Exercises Other Exercises: HEP provided per handout    General  Comments General comments (skin integrity, edema, etc.): Post operative dressing d/c/i      Pertinent Vitals/Pain Pain Assessment Pain Assessment: 0-10 Pain Score: 8  Pain Location: R hip Pain Descriptors / Indicators: Sore Pain Intervention(s): Repositioned, Premedicated before session, Monitored during session    Home Living Family/patient expects to be discharged to:: Private residence Living Arrangements: Alone Available Help at Discharge: Family;Available PRN/intermittently;Friend(s) Type of Home: House Home Access: Stairs to enter Entrance Stairs-Rails: None Entrance Stairs-Number of Steps: threshold step   Home Layout: One level Home Equipment: Rollator (4 wheels);Cane - single point;Hand held shower head;Standard Environmental consultant Additional Comments: Pt stated has two sons and church friends that may be able to alternate staying with pt 24/7    Prior Function            PT Goals (current goals can now be found in the care plan section) Progress towards PT goals: Progressing toward goals    Frequency    BID      PT Plan      Co-evaluation              AM-PAC PT "6 Clicks" Mobility   Outcome Measure  Help needed turning from your back to your side while in a flat bed without using bedrails?: A Little Help needed moving from lying on your back to sitting on the side of a flat bed without using bedrails?: A Little Help needed moving to and from a bed to a chair (including a wheelchair)?: A Little Help needed standing up from a chair using your arms (e.g., wheelchair or bedside chair)?: A Little Help needed to walk in hospital room?: A Little Help needed climbing 3-5 steps with a railing? : A Lot 6 Click Score: 17    End of Session Equipment Utilized During Treatment: Gait belt Activity Tolerance: Patient tolerated treatment well Patient left: in bed;with call bell/phone within reach;with bed alarm set;with nursing/sitter in room Nurse Communication: Mobility  status;Weight bearing status PT Visit Diagnosis: Other abnormalities of gait and mobility (R26.89);Muscle weakness (generalized) (M62.81);Pain Pain - Right/Left: Right Pain - part of body: Hip     Time: 0981-1914 PT Time Calculation (min) (ACUTE ONLY): 19 min  Charges:    $Gait Training: 8-22 mins PT General Charges $$ ACUTE PT VISIT: 1 Visit                    D. Scott Makya Yurko PT, DPT 10/10/23, 2:44 PM

## 2023-10-10 NOTE — Progress Notes (Signed)
 Occupational Therapy Evaluation Patient Details Name: Audrey Martinez MRN: [id number] DOB: 03-14-1946 Today's Date: 10/10/2023   History of Present Illness   Pt is a 78 yo female diagnosed with right hip OA and is s/p elective R THA.  PMH includes: SDH, asthma, SVT, R TKA, HTN, CAD, and A-fib.     Clinical Impressions Pt seen for OT evaluation this date, POD#1 from above surgery. Pt was MODI in all ADLs/IADLs prior to surgery, using DME/AE for MOBILITY/ADL due to hip pain. Pt is eager to return to PLOF with less pain and improved safety and independence. Pt currently requires minimal assist for LB dressing while in seated position due to pain and limited AROM of R hip. Pt able to unable to recall any posterior total hip precautions at start of session and unable to verbalize how to implement during ADL and mobility. Pt stated she never heard of "hip precautions" and was unable to recall but 1/3 at the end of session. Pt STS from EOB with MINA +2 for safery only due to pt confusion of sequencing with precautions. Pt completed pericare; CGA in standing, and sink level handwashing; CGA (verbal cues throughout for problem solving). Pt required frequent cuing with RW and safety/precautions adherence during ADLs. Pt was alert throughout session but confused about what time of day it is and when her last meal was. Pt would benefit from additional instruction in self care skills and techniques to help maintain precautions with or without assistive devices to support recall and carryover prior to discharge. OT will follow acutely.      If plan is discharge home, recommend the following:   A lot of help with walking and/or transfers;A lot of help with bathing/dressing/bathroom;Assistance with feeding;Direct supervision/assist for medications management;Direct supervision/assist for financial management;Assist for transportation;Help with stairs or ramp for entrance;Assistance with cooking/housework      Functional Status Assessment   Patient has had a recent decline in their functional status and demonstrates the ability to make significant improvements in function in a reasonable and predictable amount of time.     Equipment Recommendations   Other (comment) (Defer to next venue of care)     Recommendations for Other Services         Precautions/Restrictions   Precautions Precautions: Fall;Anterior Hip Precaution Booklet Issued: Yes (comment) Recall of Precautions/Restrictions: Impaired Restrictions Weight Bearing Restrictions Per Provider Order: Yes RLE Weight Bearing Per Provider Order: Weight bearing as tolerated     Mobility Bed Mobility Overal bed mobility: Needs Assistance Bed Mobility: Supine to Sit     Supine to sit: Supervision     General bed mobility comments: Cues for handplacement on bed rails, no physical assist    Transfers Overall transfer level: Needs assistance Equipment used: Rolling walker (2 wheels) Transfers: Sit to/from Stand, Bed to chair/wheelchair/BSC Sit to Stand: Contact guard assist           General transfer comment: On initial STS from EOB, pt had difficulty follow cues and required MINA +2 for safety and multiple attempts due to confusion. STS from toilet pt completed with CGA.      Balance Overall balance assessment: Needs assistance Sitting-balance support: No upper extremity supported, Feet supported Sitting balance-Leahy Scale: Good     Standing balance support: Bilateral upper extremity supported, During functional activity Standing balance-Leahy Scale: Fair  ADL either performed or assessed with clinical judgement   ADL Overall ADL's : Needs assistance/impaired     Grooming: Wash/dry face;Wash/dry hands;Standing;Contact guard assist               Lower Body Dressing: Minimal assistance;With adaptive equipment   Toilet Transfer: Cueing for safety;Cueing for  sequencing;Contact guard assist;Ambulation;Rolling walker (2 wheels);Comfort height toilet   Toileting- Clothing Manipulation and Hygiene: Contact guard assist;Sit to/from stand       Functional mobility during ADLs: Contact guard assist;Cueing for safety;Rolling walker (2 wheels) General ADL Comments: MINA LB dressing, CGA pericare in standing      Pertinent Vitals/Pain Pain Assessment Pain Assessment: 0-10 Pain Score: 5  Pain Location: Lower back pain Pain Descriptors / Indicators: Discomfort Pain Intervention(s): Premedicated before session, Monitored during session, Repositioned     Extremity/Trunk Assessment Upper Extremity Assessment Upper Extremity Assessment: Overall WFL for tasks assessed   Lower Extremity Assessment Lower Extremity Assessment: RLE deficits/detail;Generalized weakness RLE Deficits / Details: Anticipated deficits post R THA   Cervical / Trunk Assessment Cervical / Trunk Assessment: Normal   Communication Communication Communication: No apparent difficulties   Cognition Arousal: Alert Behavior During Therapy: WFL for tasks assessed/performed Cognition: No family/caregiver present to determine baseline, Cognition impaired   Orientation impairments: Person, Situation Awareness: Intellectual awareness intact     Executive functioning impairment (select all impairments): Initiation, Reasoning, Problem solving OT - Cognition Comments: Very confused with precautions and how impliment them into ADLs, confused about the time of day/if she had eaten or not this morning.                 Following commands: Intact       Cueing  General Comments   Cueing Techniques: Verbal cues  Post operative dressing d/c/i   Exercises Exercises: Other exercises Other Exercises Other Exercises: Edu: Hip precautions, adherence during ADL/mobility, compression sock management, DME/AD management and use while completing ADLs/IADLs   Shoulder Instructions       Home Living Family/patient expects to be discharged to:: Private residence Living Arrangements: Alone Available Help at Discharge: Family;Available PRN/intermittently;Friend(s) Type of Home: House Home Access: Stairs to enter [employer] of Steps: threshold step Entrance Stairs-Rails: None Home Layout: One level     Bathroom Shower/Tub: Walk-in shower;Tub/shower unit   Bathroom Toilet: Standard Bathroom Accessibility: No   Home Equipment: Rollator (4 wheels);Cane - single point;Hand held shower head;Standard Walker   Additional Comments: Pt stated has two sons and church friends that may be able to alternate staying with pt 24/7      Prior Functioning/Environment Prior Level of Function : Independent/Modified Independent;Driving             Mobility Comments: Mod Ind amb with a SW limited community distances, no falls in the last six months ADLs Comments: Ind with ADLs, drives    OT Problem List: Decreased activity tolerance;Impaired balance (sitting and/or standing);Decreased coordination;Decreased cognition;Decreased safety awareness;Decreased knowledge of use of DME or AE;Decreased knowledge of precautions   OT Treatment/Interventions: Self-care/ADL training;Therapeutic exercise;Energy conservation;DME and/or AE instruction;Therapeutic activities;Patient/family education;Balance training;Cognitive remediation/compensation      OT Goals(Current goals can be found in the care plan section)   Acute Rehab OT Goals Patient Stated Goal: Go to rehab OT Goal Formulation: With patient Time For Goal Achievement: 10/24/23 Potential to Achieve Goals: Good ADL Goals Pt Will Perform Grooming: with min assist;standing Pt Will Perform Lower Body Dressing: with adaptive equipment;with mod assist Pt Will Transfer to Toilet: with contact guard  assist;ambulating Pt Will Perform Toileting - Clothing Manipulation and hygiene: sit to/from stand;with contact guard assist    OT Frequency:  Min 3X/week    Co-evaluation              AM-PAC OT "6 Clicks" Daily Activity     Outcome Measure Help from another person eating meals?: A Little Help from another person taking care of personal grooming?: A Little Help from another person toileting, which includes using toliet, bedpan, or urinal?: A Little Help from another person bathing (including washing, rinsing, drying)?: A Lot Help from another person to put on and taking off regular upper body clothing?: A Little Help from another person to put on and taking off regular lower body clothing?: A Lot 6 Click Score: 16   End of Session Equipment Utilized During Treatment: Gait belt;Rolling walker (2 wheels) Nurse Communication: Mobility status  Activity Tolerance: Patient tolerated treatment well Patient left: in chair;with call bell/phone within reach  OT Visit Diagnosis: Unsteadiness on feet (R26.81);Other abnormalities of gait and mobility (R26.89);Muscle weakness (generalized) (M62.81);Other symptoms and signs involving cognitive function                Time: 4098-1191 OT Time Calculation (min): 38 min Charges:  OT General Charges $OT Visit: 1 Visit OT Evaluation $OT Eval Moderate Complexity: 1 Mod OT Treatments $Self Care/Home Management : 8-22 mins $Therapeutic Activity: 8-22 mins  Rosaria Common M.S. OTR/L  10/10/23, 11:47 AM

## 2023-10-10 NOTE — Progress Notes (Signed)
 Physical Therapy Treatment Patient Details Name: Audrey Martinez MRN: [id number] DOB: Apr 29, 1946 Today's Date: 10/10/2023   History of Present Illness Pt is a 78 yo female diagnosed with right hip OA and is s/p elective R THA.  PMH includes: SDH, asthma, SVT, R TKA, HTN, CAD, and A-fib.    PT Comments  Pt was pleasant and motivated to participate during the session and put forth good effort throughout. Pt required cuing for safe sequencing with transfers and for proper use of the RW during gait training.  Pt was generally steady with good control during transfer training with use of her UEs to assist.  Pt's gait pattern started initially with step-to pattern but progressed to step-through pattern during the second half of the session but remained somewhat antalgic on the RLE with decreased stance time.  Pt reported that she has no available consistent supervision at home with her son having recently had back surgery.  Pt will benefit from continued PT services at a SNF setting upon discharge to safely address deficits listed in patient problem list for decreased caregiver assistance and eventual return to PLOF.      If plan is discharge home, recommend the following: A little help with walking and/or transfers;A little help with bathing/dressing/bathroom;Assistance with cooking/housework;Direct supervision/assist for medications management;Assist for transportation;Help with stairs or ramp for entrance   Can travel by private vehicle     Yes  Equipment Recommendations  Rolling walker (2 wheels);BSC/3in1    Recommendations for Other Services       Precautions / Restrictions Precautions Precautions: Fall;Anterior Hip Precaution Booklet Issued: Yes (comment) Restrictions Weight Bearing Restrictions Per Provider Order: Yes RLE Weight Bearing Per Provider Order: Weight bearing as tolerated     Mobility  Bed Mobility               General bed mobility comments: NT, in  recliner    Transfers Overall transfer level: Needs assistance Equipment used: Rolling walker (2 wheels) Transfers: Sit to/from Stand Sit to Stand: Contact guard assist           General transfer comment: Min verbal cues for hand placement and increased trunk flexion    Ambulation/Gait Ambulation/Gait assistance: Contact guard assist Gait Distance (Feet): 50 Feet Assistive device: Rolling walker (2 wheels) Gait Pattern/deviations: Step-to pattern, Decreased step length - left, Decreased stance time - right, Step-through pattern Gait velocity: decreased     General Gait Details: Step-to pattern initially that gradually progressed to beginning step-through pattern with decreased RLE stance time; pt with slow cadence and short B step length but generally steady with no overt LOB but with cues needed for general sequencing with the RW for safety   Stairs             Wheelchair Mobility     Tilt Bed    Modified Rankin (Stroke Patients Only)       Balance Overall balance assessment: Needs assistance   Sitting balance-Leahy Scale: Normal     Standing balance support: Bilateral upper extremity supported, During functional activity Standing balance-Leahy Scale: Good                              Communication Communication Communication: No apparent difficulties  Cognition Arousal: Alert Behavior During Therapy: WFL for tasks assessed/performed   PT - Cognitive impairments: History of cognitive impairments, No family/caregiver present to determine baseline  Following commands: Intact      Cueing Cueing Techniques: Verbal cues, Visual cues  Exercises Other Exercises Other Exercises: HEP provided per handout    General Comments        Pertinent Vitals/Pain Pain Assessment Pain Assessment: 0-10 Pain Score: 6  Pain Location: R hip Pain Descriptors / Indicators: Sore Pain Intervention(s): Repositioned,  Premedicated before session, Ice applied, Monitored during session    Home Living Family/patient expects to be discharged to:: Private residence Living Arrangements: Alone Available Help at Discharge: Family;Available PRN/intermittently;Friend(s) Type of Home: House Home Access: Stairs to enter Entrance Stairs-Rails: None Entrance Stairs-Number of Steps: threshold step   Home Layout: One level Home Equipment: Rollator (4 wheels);Cane - single point;Hand held shower head;Standard Environmental consultant Additional Comments: Pt stated has two sons and church friends that may be able to alternate staying with pt 24/7    Prior Function            PT Goals (current goals can now be found in the care plan section) Progress towards PT goals: Progressing toward goals    Frequency    BID      PT Plan      Co-evaluation              AM-PAC PT "6 Clicks" Mobility   Outcome Measure  Help needed turning from your back to your side while in a flat bed without using bedrails?: A Little Help needed moving from lying on your back to sitting on the side of a flat bed without using bedrails?: A Little Help needed moving to and from a bed to a chair (including a wheelchair)?: A Little Help needed standing up from a chair using your arms (e.g., wheelchair or bedside chair)?: A Little Help needed to walk in hospital room?: A Little Help needed climbing 3-5 steps with a railing? : A Lot 6 Click Score: 17    End of Session Equipment Utilized During Treatment: Gait belt Activity Tolerance: Patient tolerated treatment well Patient left: in chair;with call bell/phone within reach Nurse Communication: Mobility status;Weight bearing status PT Visit Diagnosis: Other abnormalities of gait and mobility (R26.89);Muscle weakness (generalized) (M62.81);Pain Pain - Right/Left: Right Pain - part of body: Hip     Time: 1610-9604 PT Time Calculation (min) (ACUTE ONLY): 21 min  Charges:    $Gait Training:  8-22 mins PT General Charges $$ ACUTE PT VISIT: 1 Visit                     D. Scott Dillan Candela PT, DPT 10/10/23, 11:31 AM

## 2023-10-10 NOTE — TOC Progression Note (Signed)
 Transition of Care [facility]) - Progression Note    Patient Details  Name: Audrey Martinez MRN: [id number] Date of Birth: [birth year]  Transition of Care [facility]) CM/SW Contact  Alexandra Ice, RN Phone Number: 10/10/2023, 2:16 PM  Clinical Narrative:    Patient is recommended for SNF for additional rehab. She is [facility] ACO eligible. PASRR, FL2 and bedsearch completed. Pending bed offers.         Expected Discharge Plan and Services                                               Social Determinants of Health (SDOH) Interventions SDOH Screenings   Food Insecurity: No Food Insecurity (08/02/2023)   Received from [facility]  Housing: Low Risk  (08/02/2023)   Received from [facility]  Transportation Needs: No Transportation Needs (08/02/2023)   Received from [facility]  Utilities: Not At Risk (08/02/2023)   Received from [facility]  Alcohol Screen: Low Risk  (04/24/2023)  Depression (PHQ2-9): Low Risk  (07/06/2023)  Financial Resource Strain: Low Risk  (08/02/2023)   Received from [facility]  Physical Activity: Insufficiently Active (04/24/2023)  Social Connections: Moderately Isolated (04/24/2023)  Stress: No Stress Concern Present (04/24/2023)  Tobacco Use: Low Risk  (10/09/2023)  Health Literacy: Adequate Health Literacy (04/24/2023)    Readmission Risk Interventions     No data to display

## 2023-10-10 NOTE — Anesthesia Postprocedure Evaluation (Signed)
 Anesthesia Post Note  Patient: Audrey Martinez  Procedure(s) Performed: ARTHROPLASTY, HIP, TOTAL, ANTERIOR APPROACH (Right: Hip)  Patient location during evaluation: Nursing Unit Anesthesia Type: Spinal Level of consciousness: awake and alert and oriented Pain management: pain level controlled Vital Signs Assessment: post-procedure vital signs reviewed and stable Respiratory status: respiratory function stable Cardiovascular status: stable Postop Assessment: no headache, no backache, patient able to bend at knees, no apparent nausea or vomiting, adequate PO intake and able to ambulate Anesthetic complications: no   No notable events documented.   Last Vitals:  Vitals:   10/09/23 2342 10/10/23 0416  BP: 98/64 129/72  Pulse: 89 87  Resp: 16 18  Temp: 36.6 C 36.7 C  SpO2: 96% 98%    Last Pain:  Vitals:   10/09/23 2218  TempSrc:   PainSc: 3                  [place]

## 2023-10-11 ENCOUNTER — Encounter: Payer: Self-pay | Admitting: Orthopedic Surgery

## 2023-10-11 ENCOUNTER — Observation Stay

## 2023-10-11 DIAGNOSIS — R519 Headache, unspecified: Principal | ICD-10-CM

## 2023-10-11 DIAGNOSIS — Z96611 Presence of right artificial shoulder joint: Secondary | ICD-10-CM | POA: Diagnosis not present

## 2023-10-11 DIAGNOSIS — G9340 Encephalopathy, unspecified: Secondary | ICD-10-CM | POA: Diagnosis not present

## 2023-10-11 DIAGNOSIS — R5082 Postprocedural fever: Secondary | ICD-10-CM | POA: Diagnosis not present

## 2023-10-11 DIAGNOSIS — J9811 Atelectasis: Secondary | ICD-10-CM | POA: Diagnosis not present

## 2023-10-11 DIAGNOSIS — R918 Other nonspecific abnormal finding of lung field: Secondary | ICD-10-CM | POA: Diagnosis not present

## 2023-10-11 DIAGNOSIS — R509 Fever, unspecified: Secondary | ICD-10-CM

## 2023-10-11 DIAGNOSIS — G039 Meningitis, unspecified: Secondary | ICD-10-CM

## 2023-10-11 DIAGNOSIS — G9389 Other specified disorders of brain: Secondary | ICD-10-CM | POA: Diagnosis not present

## 2023-10-11 DIAGNOSIS — R0989 Other specified symptoms and signs involving the circulatory and respiratory systems: Secondary | ICD-10-CM | POA: Diagnosis not present

## 2023-10-11 LAB — CBC WITH DIFFERENTIAL/PLATELET
Abs Immature Granulocytes: 0.04 10*3/uL (ref 0.00–0.07)
Basophils Absolute: 0 10*3/uL (ref 0.0–0.1)
Basophils Relative: 0 %
Eosinophils Absolute: 0 10*3/uL (ref 0.0–0.5)
Eosinophils Relative: 0 %
HCT: 34.3 % — ABNORMAL LOW (ref 36.0–46.0)
Hemoglobin: 11.4 g/dL — ABNORMAL LOW (ref 12.0–15.0)
Immature Granulocytes: 0 %
Lymphocytes Relative: 16 %
Lymphs Abs: 1.5 10*3/uL (ref 0.7–4.0)
MCH: 30.4 pg (ref 26.0–34.0)
MCHC: 33.2 g/dL (ref 30.0–36.0)
MCV: 91.5 fL (ref 80.0–100.0)
Monocytes Absolute: 1.1 10*3/uL — ABNORMAL HIGH (ref 0.1–1.0)
Monocytes Relative: 12 %
Neutro Abs: 7 10*3/uL (ref 1.7–7.7)
Neutrophils Relative %: 72 %
Platelets: 164 10*3/uL (ref 150–400)
RBC: 3.75 MIL/uL — ABNORMAL LOW (ref 3.87–5.11)
RDW: 12.1 % (ref 11.5–15.5)
WBC: 9.7 10*3/uL (ref 4.0–10.5)
nRBC: 0 % (ref 0.0–0.2)

## 2023-10-11 LAB — CBC
HCT: 38 % (ref 36.0–46.0)
Hemoglobin: 12.4 g/dL (ref 12.0–15.0)
MCH: 30.2 pg (ref 26.0–34.0)
MCHC: 32.6 g/dL (ref 30.0–36.0)
MCV: 92.7 fL (ref 80.0–100.0)
Platelets: 149 10*3/uL — ABNORMAL LOW (ref 150–400)
RBC: 4.1 MIL/uL (ref 3.87–5.11)
RDW: 12.1 % (ref 11.5–15.5)
WBC: 9.7 10*3/uL (ref 4.0–10.5)
nRBC: 0 % (ref 0.0–0.2)

## 2023-10-11 LAB — MRSA NEXT GEN BY PCR, NASAL: MRSA by PCR Next Gen: NOT DETECTED

## 2023-10-11 LAB — COMPREHENSIVE METABOLIC PANEL WITH GFR
ALT: 21 U/L (ref 0–44)
AST: 21 U/L (ref 15–41)
Albumin: 2.9 g/dL — ABNORMAL LOW (ref 3.5–5.0)
Alkaline Phosphatase: 37 U/L — ABNORMAL LOW (ref 38–126)
Anion gap: 8 (ref 5–15)
BUN: 19 mg/dL (ref 8–23)
CO2: 23 mmol/L (ref 22–32)
Calcium: 8.5 mg/dL — ABNORMAL LOW (ref 8.9–10.3)
Chloride: 108 mmol/L (ref 98–111)
Creatinine, Ser: 0.85 mg/dL (ref 0.44–1.00)
GFR, Estimated: >60 mL/min (ref >60)
Glucose, Bld: 107 mg/dL — ABNORMAL HIGH (ref 70–99)
Potassium: 3.9 mmol/L (ref 3.5–5.1)
Sodium: 139 mmol/L (ref 135–145)
Total Bilirubin: 1 mg/dL (ref 0.0–1.2)
Total Protein: 5.2 g/dL — ABNORMAL LOW (ref 6.5–8.1)

## 2023-10-11 MED ORDER — VANCOMYCIN HCL 1750 MG/350ML IV SOLN
1750.0000 mg | INTRAVENOUS | Status: DC
  Administered 2023-10-11: 1750 mg via INTRAVENOUS

## 2023-10-11 MED ORDER — SODIUM CHLORIDE 0.9 % IV SOLN
2.0000 g | INTRAVENOUS | Status: DC
  Administered 2023-10-12: 2 g via INTRAVENOUS
  Filled 2023-10-11 (×2): qty 20

## 2023-10-11 MED ORDER — SACUBITRIL-VALSARTAN 24-26 MG PO TABS
1.0000 | ORAL_TABLET | Freq: Two times a day (BID) | ORAL | Status: DC
  Filled 2023-10-11 (×3): qty 1

## 2023-10-11 MED ORDER — VANCOMYCIN HCL 1500 MG/300ML IV SOLN
1500.0000 mg | INTRAVENOUS | Status: DC
Start: 2023-10-12 — End: 2023-10-11

## 2023-10-11 MED ORDER — VANCOMYCIN HCL 1750 MG/350ML IV SOLN
1750.0000 mg | Freq: Once | INTRAVENOUS | Status: DC
  Filled 2023-10-11: qty 350

## 2023-10-11 MED ORDER — SODIUM CHLORIDE 0.9 % IV BOLUS
500.0000 mL | Freq: Once | INTRAVENOUS | Status: AC
  Administered 2023-10-11: 500 mL via INTRAVENOUS

## 2023-10-11 MED ORDER — SODIUM CHLORIDE 0.9 % IV SOLN
2.0000 g | INTRAVENOUS | Status: DC
  Administered 2023-10-11: 2 g via INTRAVENOUS
  Filled 2023-10-11: qty 20

## 2023-10-11 MED ORDER — SODIUM CHLORIDE 0.9 % IV SOLN
500.0000 mg | INTRAVENOUS | Status: DC
  Administered 2023-10-11 – 2023-10-12 (×2): 500 mg via INTRAVENOUS
  Filled 2023-10-11 (×3): qty 5

## 2023-10-11 MED ORDER — SODIUM CHLORIDE 0.9 % IV SOLN
2.0000 g | Freq: Two times a day (BID) | INTRAVENOUS | Status: DC
  Filled 2023-10-11: qty 12.5

## 2023-10-11 NOTE — Plan of Care (Signed)
 Progressing towards discharge

## 2023-10-11 NOTE — Care Management Obs Status (Signed)
 MEDICARE OBSERVATION STATUS NOTIFICATION   Patient Details  Name: Audrey Martinez MRN: [id number] Date of Birth: 1945/12/09   Medicare Observation Status Notification Given:   (spoke with son Aleiah Belfield [phone])    Anise Kerns 10/11/2023, 1:18 PM

## 2023-10-11 NOTE — Consult Note (Signed)
 NAME: Audrey Martinez  DOB: Jun 01, 1945  MRN: [id number]  Date/Time: 10/11/2023 4:46 PM  REQUESTING PROVIDER: Dr.Cox Subjective:  REASON FOR CONSULT: Meningitis ?pt is confused and unable to give a reliable history Spoke to her son Marietta Shorter, Reviewed medical records EPIC and care everywhere  Audrey Martinez is a 78 y.o. female  with a history of  SVT S/p AVNRT ablation, NICM with EF 30-35%CAD, HTN, HLD, TIA, H/o PE , lumbar surgey underwent  elective rt Total hip  arthroplasty on 10/09/23. She was progressing well and participated with PT yesterday .She developed a fever of 101 this morning. She was c/o head ache and was confused and it was thought the norco was doing that with underlying un diagnosed dementia . That was Dc today. The hospitalist was asked to see the patient and she consulted me to r/o meningitis  Pt is awake, oriented in person , place and tells she had the ip surgery She repeats herself a lot She c/o back pain No cough or sob No chest pain No urinary issues Had some headache but none when I examined her - could be because se had received tylenol  I spoke to her son who said she is very forgetful and he thinks she as dementia though not diagnosed by a medical provider  She had severe worsening pain rt hip impairing her mobility and hence they opted for surgery with some trepidation. She lives on her own Dinah Franco wants her to go to SNF on discharge  Past Medical History:  Diagnosis Date   Actinic keratosis    Acute renal failure (ARF) (HCC) 01/31/2020   Acute respiratory failure with hypoxia (HCC) 01/29/2020   Amaurosis fugax, right eye 12/2018   Aortic atherosclerosis (HCC)    Arthritis    Asthma    Atrial tachycardia (HCC)    a.) s/p RFCA for AVNRT in 2008 at [facility] [place]    Basal cell carcinoma 07/2016   central forehead (tx in [place] )   Bilateral pulmonary embolism (HCC) 01/28/2020   a.) in setting of SARS-CoV-2 infection (12/2019);  noted BILATERALLY with (+) associated RIGHT heart strain; Tx'd with rivaroxaban  Xarelto  20 mg daily x 6 months then PRN   Cerebellar infarct (RIGHT) 1998   Coronary artery disease    DDD (degenerative disc disease), cervical    Dyspnea    Facial laceration 06/16/2022   Date of injury: 06/08/2022  [facility] ED history below:  78 year old female presenting to the emergency department after mechanical, nonsyncopal fall causing her to strike her face on the edge/corner of a curio cabinet and sustaining a deep and approximate 8 cm laceration to the right cheek in an irregular pattern to the angle of the right lower jaw. Wound does not proceed th   GERD (gastroesophageal reflux disease)    Herniated disc, lumbar    HFrEF (heart failure with reduced ejection fraction) (HCC)    History of 2019 novel coronavirus disease (COVID-19) 12/2019   History of bilateral cataract extraction    Hyperlipidemia    Incomplete left bundle branch block (LBBB)    Left wrist fracture    Lumbar stenosis    s/p LEFT L2-4 hemilaminectomies and partial facetectomies, foraminotomies for lateral recess and foraminal decompression of L3 and L4 nerve roots   Melanoma (HCC) 05/12/2020   Melanoma IS R post neck, excised 08/10/20   Mobitz type 1 second degree atrioventricular block    a.) evaluated by EP --> paroxysmal in nature; nocturnal epidoe felt  to be vagally mediated   NICM (nonischemic cardiomyopathy) (HCC)    a. 04/2018 Echo: EF 45-50%. Anteroseptal and apical HK in some views. Mild AI/MR. Nl RV fxn; b. 05/2018 MV: mid-dist ant and antsept mild defect - ? breast atten. EF 41%. No ischemia; c. 03/2019 Echo: EF 30-35%, Gr2 DD, Trace MR, triv TR. Asc Ao ectatic dil - 38mm.    Osteoporosis    Personal history of transient ischemic attack (TIA), and cerebral infarction without residual deficits 04/22/2003   Pneumonia due to COVID-19 virus 01/26/2020   SVT (supraventricular tachycardia) (HCC)    a.2008  s/p RFCA for AVNRT; b. 9 & 02/2019 Zio x 2: 1. Avg HR 89, 3 runs NSVT (longest 7 beats), Mobitz 1. 2. Avg HR 89, NSVT x 1 (5 beats), Mobitz 1. No signif arrhythmia.   Tendinitis of right knee 07/03/2017    Past Surgical History:  Procedure Laterality Date   APPENDECTOMY     BREAST CYST ASPIRATION Left 1977   CARDIAC CATHETERIZATION  2009   [facility] in [place] [facility]   CARDIAC ELECTROPHYSIOLOGY STUDY AND ABLATION  2009   CATARACT EXTRACTION, BILATERAL  2010   COLONOSCOPY WITH PROPOFOL  N/A 03/21/2018   Procedure: COLONOSCOPY WITH Biopsy;  Surgeon: Selena Daily, MD;  Location: [facility];  Service: Endoscopy;  Laterality: N/A;   COLONOSCOPY WITH PROPOFOL  N/A 05/03/2021   Procedure: COLONOSCOPY WITH PROPOFOL ;  Surgeon: Selena Daily, MD;  Location: [facility] ENDOSCOPY;  Service: Gastroenterology;  Laterality: N/A;   CORONARY ANGIOPLASTY     DARRACH'S PROCEDURE Bilateral 1977   ESOPHAGOGASTRODUODENOSCOPY  2013   gastritis; done for complaint of dysphagia   ESOPHAGOGASTRODUODENOSCOPY (EGD) WITH PROPOFOL  N/A 03/21/2018   Procedure: ESOPHAGOGASTRODUODENOSCOPY (EGD) WITH Biopsies;  Surgeon: Selena Daily, MD;  Location: [facility];  Service: Endoscopy;  Laterality: N/A;  KEEP THIS PATIENT FIRST   JOINT REPLACEMENT     LEFT HEART CATH AND CORONARY ANGIOGRAPHY Left 05/02/2019   Procedure: LEFT HEART CATH AND CORONARY ANGIOGRAPHY;  Surgeon: Devorah Fonder, MD;  Location: [facility] INVASIVE CV LAB;  Service: Cardiovascular;  Laterality: Left;   LUMBAR LAMINECTOMY  2017   Procedure: LUMBAR LAMINECTOMY (L2-L4)   LYMPH NODE REMOVAL (NECK)     MOHS SURGERY  07/2016   BCCA nose   POLYPECTOMY N/A 03/21/2018   Procedure: POLYPECTOMY;  Surgeon: Selena Daily, MD;  Location: [facility];  Service: Endoscopy;  Laterality: N/A;   REPLACEMENT TOTAL KNEE Right 2013   TONSILLECTOMY     TOTAL HIP ARTHROPLASTY Right 10/09/2023   Procedure:  ARTHROPLASTY, HIP, TOTAL, ANTERIOR APPROACH;  Surgeon: Venus Ginsberg, MD;  Location: [facility] ORS;  Service: Orthopedics;  Laterality: Right;   TOTAL SHOULDER REPLACEMENT Right 2012    Social History   Socioeconomic History   Marital status: Widowed    Spouse name: Not on file   Number of children: 3   Years of education: Not on file   Highest education level: Not on file  Occupational History   Occupation: retired  Tobacco Use   Smoking status: Never   Smokeless tobacco: Never  Vaping Use   Vaping status: Never Used  Substance and Sexual Activity   Alcohol use: Not Currently   Drug use: No   Sexual activity: Never  Other Topics Concern   Not on file  Social History Narrative   ** Merged History Encounter **       Lives at home alone, has resources if help is needed  Social Drivers of Corporate investment banker Strain: Low Risk  (08/02/2023)   Received from [facility]   Overall Financial Resource Strain (CARDIA)    Difficulty of Paying Living Expenses: Not hard at all  Food Insecurity: No Food Insecurity (08/02/2023)   Received from [facility]   Hunger Vital Sign    Worried About Running Out of Food in the Last Year: Never true    Ran Out of Food in the Last Year: Never true  Transportation Needs: No Transportation Needs (08/02/2023)   Received from [facility] - Transportation    In the past 12 months, has lack of transportation kept you from medical appointments or from getting medications?: No    Lack of Transportation (Non-Medical): No  Physical Activity: Insufficiently Active (04/24/2023)   Exercise Vital Sign    Days of Exercise per Week: 4 days    Minutes of Exercise per Session: 20 min  Stress: No Stress Concern Present (04/24/2023)   [employer] of Occupational Health - Occupational Stress Questionnaire    Feeling of Stress : Not at all  Social Connections: Moderately Isolated (04/24/2023)    Social Connection and Isolation Panel [NHANES]    Frequency of Communication with Friends and Family: More than three times a week    Frequency of Social Gatherings with Friends and Family: Three times a week    Attends Religious Services: More than 4 times per year    Active Member of Clubs or Organizations: No    Attends Banker Meetings: Never    Marital Status: Widowed  Intimate Partner Violence: Not At Risk (10/09/2023)   Humiliation, Afraid, Rape, and Kick questionnaire    Fear of Current or Ex-Partner: No    Emotionally Abused: No    Physically Abused: No    Sexually Abused: No    Family History  Problem Relation Age of Onset   Diabetes Mother    Stroke Mother    Valvular heart disease Father    CAD Sister 23       died of MI   Stroke Sister    Stroke Sister 13   Clotting disorder Half-Sister    Clotting disorder Half-Sister    Breast cancer Neg Hx    Allergies  Allergen Reactions   Amoxicillin Hives, Shortness Of Breath and Swelling    Has tolerated a 1st generation cephalosporin (CEFAZOLIN) in the past without documented ADRs.    Aspirin Anaphylaxis and Shortness Of Breath   Bacitracin-Polymyx-Neo-Hc [Bacitra-Neomycin-Polymyxin-Hc] Rash   Ciprofloxacin Rash   Ciprofloxacin-Dexamethasone  Anaphylaxis   Crestor  [Rosuvastatin ] Other (See Comments)    Caused problems with memory.   Erythromycin Hives and Rash   Hydrochlorothiazide Other (See Comments) and Palpitations    Raises her blood pressure.    Ibuprofen Other (See Comments)    Sharp stomach pains   Morphine And Codeine Nausea And Vomiting   Morpholine Salicylate Nausea Only   Omeprazole Palpitations and Other (See Comments)    Tachycardia and same sensation as her SVTs per patient   Doxycycline Hyclate Hives   Keflex [Cephalexin] Hives    Has tolerated a 1st generation cephalosporin (CEFAZOLIN) in the past without documented ADRs.     Lovastatin Other (See Comments)    "Unable to function"    Simvastatin Other (See Comments)    "Unable to function"   Silicone Dermatitis and Hives   Neomycin-Bacitracin Zn-Polymyx Hives and Rash   Tape Hives and  Rash   I? Current Facility-Administered Medications  Medication Dose Route Frequency Provider Last Rate Last Admin   0.9 %  sodium chloride  infusion   Intravenous Continuous Aberman, Zachary, MD       acetaminophen  (TYLENOL ) tablet 325-650 mg  325-650 mg Oral Q6H PRN Aberman, Zachary, MD   650 mg at 10/11/23 1637   azithromycin (ZITHROMAX) 500 mg in sodium chloride  0.9 % 250 mL IVPB  500 mg Intravenous Q24H Cox, Amy N, DO 250 mL/hr at 10/11/23 1626 500 mg at 10/11/23 1626   baclofen  (LIORESAL ) tablet 10 mg  10 mg Oral TID Aberman, Zachary, MD   10 mg at 10/10/23 2211   carvedilol  (COREG ) tablet 6.25 mg  6.25 mg Oral BID WC Aberman, Zachary, MD   6.25 mg at 10/11/23 1636   docusate sodium  (COLACE) capsule 100 mg  100 mg Oral BID Aberman, Zachary, MD   100 mg at 10/10/23 2210   enoxaparin  (LOVENOX ) injection 40 mg  40 mg Subcutaneous Q24H Aberman, Zachary, MD   40 mg at 10/11/23 0812   ezetimibe  (ZETIA ) tablet 10 mg  10 mg Oral Daily Aberman, Zachary, MD   10 mg at 10/10/23 1011   gabapentin  (NEURONTIN ) capsule 300 mg  300 mg Oral TID WC Chappell, Alex B, RPH   300 mg at 10/11/23 6045   And   gabapentin  (NEURONTIN ) capsule 900 mg  900 mg Oral QHS Chappell, Alex B, RPH   900 mg at 10/10/23 2210   menthol-cetylpyridinium (CEPACOL) lozenge 3 mg  1 lozenge Oral PRN Aberman, Zachary, MD       Or   phenol (CHLORASEPTIC) mouth spray 1 spray  1 spray Mouth/Throat PRN Aberman, Zachary, MD       metoCLOPramide (REGLAN) tablet 5-10 mg  5-10 mg Oral Q8H PRN Aberman, Zachary, MD       Or   metoCLOPramide (REGLAN) injection 5-10 mg  5-10 mg Intravenous Q8H PRN Aberman, Zachary, MD       morphine (PF) 2 MG/ML injection 0.5-1 mg  0.5-1 mg Intravenous Q2H PRN Aberman, Zachary, MD       ondansetron  (ZOFRAN ) tablet 4 mg  4 mg Oral Q6H PRN Aberman, Zachary,  MD       Or   ondansetron  (ZOFRAN ) injection 4 mg  4 mg Intravenous Q6H PRN Aberman, Zachary, MD       [START ON 10/12/2023] sacubitril-valsartan (ENTRESTO ) 24-26 mg per tablet  1 tablet Oral BID Coralyn Derry, PA-C       spironolactone  (ALDACTONE ) tablet 12.5 mg  12.5 mg Oral Daily Aberman, Zachary, MD   12.5 mg at 10/10/23 1010   traMADol  (ULTRAM ) tablet 50 mg  50 mg Oral Q6H PRN Aberman, Zachary, MD         Abtx:  Anti-infectives (From admission, onward)    Start     Dose/Rate Route Frequency Ordered Stop   10/11/23 1600  cefTRIAXone (ROCEPHIN) 2 g in sodium chloride  0.9 % 100 mL IVPB  Status:  Discontinued        2 g 200 mL/hr over 30 Minutes Intravenous Every 24 hours 10/11/23 1516 10/11/23 1640   10/11/23 1600  azithromycin (ZITHROMAX) 500 mg in sodium chloride  0.9 % 250 mL IVPB        500 mg 250 mL/hr over 60 Minutes Intravenous Every 24 hours 10/11/23 1518 10/16/23 1559   10/09/23 2200  vancomycin (VANCOCIN) IVPB 1000 mg/200 mL premix        1,000 mg 200 mL/hr over  60 Minutes Intravenous Every 12 hours 10/09/23 1526 10/09/23 2230   10/09/23 0830  vancomycin (VANCOCIN) IVPB 1000 mg/200 mL premix        1,000 mg 200 mL/hr over 60 Minutes Intravenous On call to O.R. 10/09/23 0816 10/09/23 1002   10/09/23 0600  gentamicin (GARAMYCIN) 460 mg in dextrose  5 % 100 mL IVPB        5 mg/kg  92.1 kg 111.5 mL/hr over 60 Minutes Intravenous On call to O.R. 10/09/23 0015 10/09/23 1612       REVIEW OF SYSTEMS:  Const: negative fever, negative chills, negative weight loss Eyes: negative diplopia or visual changes, negative eye pain ENT: negative coryza, negative sore throat Resp: negative cough, hemoptysis, dyspnea Cards: negative for chest pain, palpitations, lower extremity edema GU: negative for frequency, dysuria and hematuria GI: Negative for abdominal pain, diarrhea, bleeding, constipation Skin: negative for rash and pruritus Heme: negative for easy bruising and gum/nose  bleeding MS: , back pain and  weakness Neurolo: headaches, no dizziness, vertigo, memory problems  Psych: negative for feelings of anxiety, depression  Endocrine: negative for thyroid , diabetes Allergy/Immunology- multiple allergies listed Antibiotics are amoxicillin, cipro, doxy, keflex  Objective:  VITALS:  BP 106/66 (BP Location: Left Arm)   Pulse 95   Temp 98.7 F (37.1 C) (Axillary)   Resp 15   Ht 5\' 2"  (1.575 m)   Wt 86.5 kg   SpO2 93%   BMI 34.88 kg/m    PHYSICAL EXAM:  General: Awake, oriented in person, place. confused Head: Normocephalic, without obvious abnormality, atraumatic. Eyes: Conjunctivae clear, anicteric sclerae. Pupils are equal ENT Nares normal. No drainage or sinus tenderness. Lips, mucosa, and tongue normal. No Thrush Neck: Supple, symmetrical, no adenopathy, thyroid : non tender no carotid bruit and no JVD. Back: No CVA tenderness.lumbar scar Lungs: Clear to auscultation bilaterally. No Wheezing or Rhonchi. No rales. Heart: Regular rate and rhythm, no murmur, rub or gallop. Abdomen: Soft, non-tender,not distended. Bowel sounds normal. No masses Extremities: rt hip surgical site covered with honey comb dressing  Skin: No rashes or lesions. Or bruising Lymph: Cervical, supraclavicular normal. Neurologic: non focal Has clonus upper extremities Pertinent Labs Lab Results CBC    Component Value Date/Time   WBC 9.7 10/11/2023 0626   RBC 4.10 10/11/2023 0626   HGB 12.4 10/11/2023 0626   HGB 15.4 06/24/2022 0841   HCT 38.0 10/11/2023 0626   HCT 47.1 (H) 06/24/2022 0841   PLT 149 (L) 10/11/2023 0626   PLT 232 06/24/2022 0841   MCV 92.7 10/11/2023 0626   MCV 95 06/24/2022 0841   MCH 30.2 10/11/2023 0626   MCHC 32.6 10/11/2023 0626   RDW 12.1 10/11/2023 0626   RDW 12.1 06/24/2022 0841   LYMPHSABS 1.9 10/03/2023 1401   LYMPHSABS 2.0 06/24/2022 0841   MONOABS 0.6 10/03/2023 1401   EOSABS 0.2 10/03/2023 1401   EOSABS 0.2 06/24/2022 0841    BASOSABS 0.0 10/03/2023 1401   BASOSABS 0.0 06/24/2022 0841       Latest Ref Rng & Units 10/10/2023    6:28 AM 10/03/2023    2:01 PM 06/26/2023    3:57 AM  CMP  Glucose 70 - 99 mg/dL 161  92  95   BUN 8 - 23 mg/dL 21  20  23    Creatinine 0.44 - 1.00 mg/dL 0.96  0.45  4.09   Sodium 135 - 145 mmol/L 140  137  141   Potassium 3.5 - 5.1 mmol/L 4.3  4.4  4.0  Chloride 98 - 111 mmol/L 107  101  108   CO2 22 - 32 mmol/L 25  28  25    Calcium  8.9 - 10.3 mg/dL 9.3  9.7  8.7   Total Protein 6.5 - 8.1 g/dL  6.4    Total Bilirubin 0.0 - 1.2 mg/dL  0.9    Alkaline Phos 38 - 126 U/L  50    AST 15 - 41 U/L  16    ALT 0 - 44 U/L  12        Microbiology: Recent Results (from the past 240 hours)  Surgical pcr screen     Status: None   Collection Time: 10/03/23  2:01 PM   Specimen: Nasal Mucosa; Nasal Swab  Result Value Ref Range Status   MRSA, PCR NEGATIVE NEGATIVE Final   Staphylococcus aureus NEGATIVE NEGATIVE Final    Comment: (NOTE) The Xpert SA Assay (FDA approved for NASAL specimens in patients 45 years of age and older), is one component of a comprehensive surveillance program. It is not intended to diagnose infection nor to guide or monitor treatment. Performed at [facility], [address] [place], [place] [address]     IMAGING RESULTS:  I have personally reviewed the films ?left perihilar infiltrate  CNS Left frontal encephalomalacia   Impression/Recommendation 78 yr female with Elective rt hip arthroplasty on 10/09/23, fever this morning and confusion  Encephalopathy with some clonus- likely due to meds- Norco, baclofen  She is also on gabapentin  As per son she likely has dementia CT scan head shows left frontal encephalomalacia She is oriented to person /place, responds appropriately, no neck stiffness Doubt she has meningitis She received spinal anaesthesia which is causing her back pain She had some headache- could be from spinal If it gets worse  may have to consider CSF leak Will hold off any diagnostic lumbar puncture  Fever - post OP watch for hematoma at the surgical site Atelectasis Has left pulmonary infiltrate- ? Pneumonia vs fluid Pt is currently on ceftriaxone and azithromycin Will add vanco for now. Will not give cefepime because of encephalopathy Blood culture ordered -was not sent before antibiotic was started  H/o SVT- exg on 5/6 sinus rhythm and  1st degree block  Non ischemic cardiomyopathy On carvedilol  Sacubitril-valsartan EF in Jan was 50-55%  H.o TIA  Discussed with Dr.Aberman and Dr.Cox Discussed with her nurses? ? I have personally spent  75---minutes involved in face-to-face and non-face-to-face activities for this patient on the day of the visit. Professional time spent includes the following activities: Preparing to see the patient (review of tests), Obtaining and reviewing separately obtained history , Performing a medically appropriate examination  , Ordering tests/ communicating with other health care professionals orthopedic and hospitalist, Documenting clinical information in the EMR, Independently interpreting results , Communicating results to the patient and son Counseling and educating the patient/son  This consult involved complex antimicrobial management   ________________________________________________

## 2023-10-11 NOTE — Progress Notes (Signed)
 PT Cancellation Note  Patient Details Name: Audrey Martinez MRN: [id number] DOB: 27-Dec-1945   Cancelled Treatment:    Reason Eval/Treat Not Completed: Other (comment): Nursing requested PT hold this AM with pt presenting with increased confusion and preparing for imaging.  Will attempt to see pt at a future date/time as medically appropriate.     Lavenia Post PT, DPT 10/11/23, 11:23 AM

## 2023-10-11 NOTE — Plan of Care (Signed)

## 2023-10-11 NOTE — Care Management (Signed)
 Patient in deep sleep, spoke with nurse who stated patient is unable to comprehend currently.  Telephone call to son Wilba Gieck [phone], viewed observation status with him.

## 2023-10-11 NOTE — Progress Notes (Signed)
 OT Cancellation Note  Patient Details Name: Rolene Gores MRN: [id number] DOB: 1945/07/24   Cancelled Treatment:    Reason Eval/Treat Not Completed: Other (comment). Nursing requested PT/OT hold this AM with pt presenting with increased confusion. Pt still confused and very lethargic per nurse this afternoon, will hold and re-attempt tomorrow.   Astoria Condon E Padme Arriaga 10/11/2023, 2:16 PM

## 2023-10-11 NOTE — Hospital Course (Signed)
 [name] is a 78 year old female with history of Alzheimer dementia, history of subdural hematoma, history of SVT, neuropathy, asthma without status asthmaticus, hyperlipidemia, right hip osteoarthritis, was admitted under orthopedic service for chief concerns of right hip arthroplasty anterior approach.  Vitals on day of hospitalist consultation reviewed Tmax of 101.1, currently temperature is 98.5, respiration rate of 16, heart rate 95, blood pressure 117/68, SpO2 96% on room air.  CBC on day of consultation reviewed WBC of 9.7, hemoglobin 12.4, platelets of 149.  BMP from 5/13 showed serum sodium is 140, potassium 4.3, chloride 107, bicarb 25, nonfasting blood glucose 122, BUN of 21, serum creatinine 0.91, EGFR greater than 60.  Hospitalist service was consulted for altered mentation and fever.  Orthopedic service concerned with too much opioids and hydrocodone -acetaminophen  5-325 mg, 1 to 2 tablets have been discontinued.

## 2023-10-11 NOTE — Care Management Obs Status (Signed)
 MEDICARE OBSERVATION STATUS NOTIFICATION   Patient Details  Name: Audrey Martinez MRN: [id number] Date of Birth: 08/11/1945   Medicare Observation Status Notification Given:  No    Anise Kerns 10/11/2023, 1:23 PM

## 2023-10-11 NOTE — TOC Progression Note (Signed)
 Transition of Care [facility]) - Progression Note    Patient Details  Name: Audrey Martinez MRN: [id number] Date of Birth: 10-09-1945  Transition of Care [facility]) CM/SW Contact  Alexandra Ice, RN Phone Number: 10/11/2023, 1:16 PM  Clinical Narrative:     Received a call back from Micah, stated he spoke with his brother and their first choice is [facility]. They asked if we could make sure patient's phone is charged and next to her. They have been trying to call her and she is not answering. Updated RN and MD.        Expected Discharge Plan and Services                                               Social Determinants of Health (SDOH) Interventions SDOH Screenings   Food Insecurity: No Food Insecurity (08/02/2023)   Received from [facility]  Housing: Low Risk  (08/02/2023)   Received from [facility]  Transportation Needs: No Transportation Needs (08/02/2023)   Received from [facility]  Utilities: Not At Risk (08/02/2023)   Received from [facility]  Alcohol Screen: Low Risk  (04/24/2023)  Depression (PHQ2-9): Low Risk  (07/06/2023)  Financial Resource Strain: Low Risk  (08/02/2023)   Received from [facility]  Physical Activity: Insufficiently Active (04/24/2023)  Social Connections: Moderately Isolated (04/24/2023)  Stress: No Stress Concern Present (04/24/2023)  Tobacco Use: Low Risk  (10/09/2023)  Health Literacy: Adequate Health Literacy (04/24/2023)    Readmission Risk Interventions     No data to display

## 2023-10-11 NOTE — TOC Progression Note (Signed)
 Transition of Care [facility]) - Progression Note    Patient Details  Name: Audrey Martinez MRN: [id number] Date of Birth: 01-31-1946  Transition of Care [facility]) CM/SW Contact  Alexandra Ice, RN Phone Number: 10/11/2023, 11:22 AM  Clinical Narrative:    Benito Bran, explained purpose of call and role of TOC. Explained that his mother was recommended for SNF for additional rehab prior to returning home. TOC went over bed offers with Micah. He stated he will review with his brother and call TOC back with choice.         Expected Discharge Plan and Services                                               Social Determinants of Health (SDOH) Interventions SDOH Screenings   Food Insecurity: No Food Insecurity (08/02/2023)   Received from [facility]  Housing: Low Risk  (08/02/2023)   Received from [facility]  Transportation Needs: No Transportation Needs (08/02/2023)   Received from [facility]  Utilities: Not At Risk (08/02/2023)   Received from [facility]  Alcohol Screen: Low Risk  (04/24/2023)  Depression (PHQ2-9): Low Risk  (07/06/2023)  Financial Resource Strain: Low Risk  (08/02/2023)   Received from [facility]  Physical Activity: Insufficiently Active (04/24/2023)  Social Connections: Moderately Isolated (04/24/2023)  Stress: No Stress Concern Present (04/24/2023)  Tobacco Use: Low Risk  (10/09/2023)  Health Literacy: Adequate Health Literacy (04/24/2023)    Readmission Risk Interventions     No data to display

## 2023-10-11 NOTE — Progress Notes (Signed)
 Subjective: 2 Days Post-Op Procedure(s) (LRB): ARTHROPLASTY, HIP, TOTAL, ANTERIOR APPROACH (Right) Patient reports pain as mild  Patient is well, and has had no acute complaints or problems Denies any CP, SOB, ABD pain. We will continue therapy today.  Plan to dc to SNF  Objective: Vital signs in last 24 hours: Temp:  [97.3 F (36.3 C)-101.1 F (38.4 C)] 101.1 F (38.4 C) (05/14 0801) Pulse Rate:  [88-128] 128 (05/14 0801) Resp:  [15-18] 15 (05/14 0801) BP: (91-121)/(60-75) 121/75 (05/14 0801) SpO2:  [92 %-100 %] 92 % (05/14 0801)  Intake/Output from previous day: No intake/output data recorded. Intake/Output this shift: No intake/output data recorded.  Recent Labs    10/10/23 0628 10/11/23 0626  HGB 12.8 12.4   Recent Labs    10/10/23 0628 10/11/23 0626  WBC 14.3* 9.7  RBC 4.19 4.10  HCT 38.7 38.0  PLT 169 149*   Recent Labs    10/10/23 0628  NA 140  K 4.3  CL 107  CO2 25  BUN 21  CREATININE 0.91  GLUCOSE 122*  CALCIUM  9.3   No results for input(s): "LABPT", "INR" in the last 72 hours.  EXAM General - Patient is Alert, Appropriate, and Oriented x 1  Pulmonary - no respiratory distress Extremity - Neurovascular intact Sensation intact distally Intact pulses distally Dorsiflexion/Plantar flexion intact Dressing - dressing C/D/I and no drainage Abdomen - soft, non-tender Motor Function - intact, moving foot and toes well on exam.   Past Medical History:  Diagnosis Date   Actinic keratosis    Acute renal failure (ARF) (HCC) 01/31/2020   Acute respiratory failure with hypoxia (HCC) 01/29/2020   Amaurosis fugax, right eye 12/2018   Aortic atherosclerosis (HCC)    Arthritis    Asthma    Atrial tachycardia (HCC)    a.) s/p RFCA for AVNRT in 2008 at [facility] [place]    Basal cell carcinoma 07/2016   central forehead (tx in [place] )   Bilateral pulmonary embolism (HCC) 01/28/2020   a.) in setting of SARS-CoV-2 infection  (12/2019); noted BILATERALLY with (+) associated RIGHT heart strain; Tx'd with rivaroxaban  Xarelto  20 mg daily x 6 months then PRN   Cerebellar infarct (RIGHT) 1998   Coronary artery disease    DDD (degenerative disc disease), cervical    Dyspnea    Facial laceration 06/16/2022   Date of injury: 06/08/2022  [facility] ED history below:  78 year old female presenting to the emergency department after mechanical, nonsyncopal fall causing her to strike her face on the edge/corner of a curio cabinet and sustaining a deep and approximate 8 cm laceration to the right cheek in an irregular pattern to the angle of the right lower jaw. Wound does not proceed th   GERD (gastroesophageal reflux disease)    Herniated disc, lumbar    HFrEF (heart failure with reduced ejection fraction) (HCC)    History of 2019 novel coronavirus disease (COVID-19) 12/2019   History of bilateral cataract extraction    Hyperlipidemia    Incomplete left bundle branch block (LBBB)    Left wrist fracture    Lumbar stenosis    s/p LEFT L2-4 hemilaminectomies and partial facetectomies, foraminotomies for lateral recess and foraminal decompression of L3 and L4 nerve roots   Melanoma (HCC) 05/12/2020   Melanoma IS R post neck, excised 08/10/20   Mobitz type 1 second degree atrioventricular block    a.) evaluated by EP --> paroxysmal in nature; nocturnal epidoe felt to be vagally  mediated   NICM (nonischemic cardiomyopathy) (HCC)    a. 04/2018 Echo: EF 45-50%. Anteroseptal and apical HK in some views. Mild AI/MR. Nl RV fxn; b. 05/2018 MV: mid-dist ant and antsept mild defect - ? breast atten. EF 41%. No ischemia; c. 03/2019 Echo: EF 30-35%, Gr2 DD, Trace MR, triv TR. Asc Ao ectatic dil - 38mm.    Osteoporosis    Personal history of transient ischemic attack (TIA), and cerebral infarction without residual deficits 04/22/2003   Pneumonia due to COVID-19 virus 01/26/2020   SVT (supraventricular tachycardia) (HCC)     a.2008 s/p RFCA for AVNRT; b. 9 & 02/2019 Zio x 2: 1. Avg HR 89, 3 runs NSVT (longest 7 beats), Mobitz 1. 2. Avg HR 89, NSVT x 1 (5 beats), Mobitz 1. No signif arrhythmia.   Tendinitis of right knee 07/03/2017    Assessment/Plan:   2 Days Post-Op Procedure(s) (LRB): ARTHROPLASTY, HIP, TOTAL, ANTERIOR APPROACH (Right) Principal Problem:   S/P total right hip arthroplasty  Estimated body mass index is 34.88 kg/m as calculated from the following:   Height as of this encounter: 5\' 2"  (1.575 m).   Weight as of this encounter: 86.5 kg. Advance diet Up with therapy Pain controlled Altered Mental status - Exacerbation of underlying dementia. Will dc Norco. Tylenol  for pain. Labs stable, white count down to WNL VSS - Fever. Temp 101. Tylenol  ordered. UA/CXR, encourage incentive spirometer.  CM to assist with discharge to SNF.  DVT Prophylaxis - Lovenox , TED hose, and SCDs Weight-Bearing as tolerated to right leg   T. Thomos Flies, PA-C [facility] 10/11/2023, 8:02 AM

## 2023-10-11 NOTE — Assessment & Plan Note (Signed)
 Home Coreg  6.25 mg p.o. twice daily, Entresto  24-26 mg,  spironolactone  12.5 mg daily

## 2023-10-11 NOTE — Consult Note (Signed)
 Pharmacy Antibiotic Note  ASSESSMENT: 78 y.o. female with PMH including Alzheimers, hx SDH is presenting with sepsis, headache, and concerns during an encounter for right THA. Concern for pneumonia, with CXR showing left perihilar infiltrate, suspicious for pneumonia. Pharmacy has been consulted to manage vancomycin dosing.  Patient is also receiving ceftriaxone and azithromycin.  Patient measurements: Height: 5\' 2"  (157.5 cm) Weight: 86.5 kg (190 lb 11.2 oz) IBW/kg (Calculated) : 50.1  Vital signs: Temp: 98.7 F (37.1 C) (05/14 1454) Temp Source: Axillary (05/14 1454) BP: 106/66 (05/14 1454) Pulse Rate: 95 (05/14 1454) Recent Labs  Lab 10/10/23 0628 10/11/23 0626  WBC 14.3* 9.7  CREATININE 0.91  --    Estimated Creatinine Clearance: 54.6 mL/min (by C-G formula based on SCr of 0.91 mg/dL).  Allergies: Allergies  Allergen Reactions   Amoxicillin Hives, Shortness Of Breath and Swelling    Has tolerated a 1st generation cephalosporin (CEFAZOLIN) in the past without documented ADRs.    Aspirin Anaphylaxis and Shortness Of Breath   Bacitracin-Polymyx-Neo-Hc [Bacitra-Neomycin-Polymyxin-Hc] Rash   Ciprofloxacin Rash   Ciprofloxacin-Dexamethasone  Anaphylaxis   Crestor  [Rosuvastatin ] Other (See Comments)    Caused problems with memory.   Erythromycin Hives and Rash   Hydrochlorothiazide Other (See Comments) and Palpitations    Raises her blood pressure.    Ibuprofen Other (See Comments)    Sharp stomach pains   Morphine And Codeine Nausea And Vomiting   Morpholine Salicylate Nausea Only   Omeprazole Palpitations and Other (See Comments)    Tachycardia and same sensation as her SVTs per patient   Doxycycline Hyclate Hives   Keflex [Cephalexin] Hives    Has tolerated a 1st generation cephalosporin (CEFAZOLIN) in the past without documented ADRs.     Lovastatin Other (See Comments)    "Unable to function"   Simvastatin Other (See Comments)    "Unable to function"   Silicone  Dermatitis and Hives   Neomycin-Bacitracin Zn-Polymyx Hives and Rash   Tape Hives and Rash    Antimicrobials this admission: Vancomycin 5/14 >> Cefepime 5/14 x 1 Azithromycin 5/14 >> Ceftriaxone 5/14 >>  Dose adjustments this admission: N/A  Microbiology results: 5/14 MRSA PCR: ordered  PLAN: Initiate vancomycin 1750mg  IV q48H eAUC 513, Cmax 47.6, Cmin 8 Scr 0.91, IBW, Vd 0.5 L/kg Follow up MRSA PCR and cultures to assess for antibiotic optimization. Monitor renal function to assess for any necessary antibiotic dosing changes.   Thank you for allowing pharmacy to be a part of this patient's care.  Will M. Alva Jewels, PharmD Clinical Pharmacist 10/11/2023 5:21 PM

## 2023-10-11 NOTE — Assessment & Plan Note (Signed)
 Symptomatic support, as needed acetaminophen  ordered

## 2023-10-11 NOTE — Assessment & Plan Note (Addendum)
 And neck pain with fever At this time nosocomial meningitis cannot be excluded at this time Blood cultures x 2 have been ordered Infectious disease specialist has been consulted Vancomycin, cefepime, azithromycin IV ordered IR evaluate and manage for urgent lumbar puncture placed CSF fluid orders with culture, Gram stain, cell count, differential, cytology, protein, glucose ordered Discussed with infectious disease specialist, IR specialist  Addendum: Infectious disease specialist saw the patient and does not think the patient has meningitis.  Patient does have encephalopathy however given patient's mentation decline, difficult to assess.  Fever likely postop versus pneumonia.

## 2023-10-11 NOTE — Consult Note (Addendum)
 Initial Consultation Note   Patient: Audrey Martinez HQI:[id number] DOB: 05-21-1946 PCP: Audrey Dixons, MD DOA: 10/09/2023 DOS: the patient was seen and examined on 10/11/2023 Primary service: Audrey Ginsberg, MD  Referring physician: Francenia Ingle, PA Reason for consult: Altered mental status, fever  I have personally briefly reviewed patient's old medical records in [facility] EMR.  HPI: Audrey Martinez is a 78 year old female with history of Alzheimer dementia, history of subdural hematoma, history of SVT, neuropathy, asthma without status asthmaticus, hyperlipidemia, right hip osteoarthritis, was admitted under orthopedic service for chief concerns of right hip arthroplasty anterior approach.  Vitals on day of hospitalist consultation reviewed Tmax of 101.1, currently temperature is 98.5, respiration rate of 16, heart rate 95, blood pressure 117/68, SpO2 96% on room air.  CBC on day of consultation reviewed WBC of 9.7, hemoglobin 12.4, platelets of 149.  BMP from 5/13 showed serum sodium is 140, potassium 4.3, chloride 107, bicarb 25, nonfasting blood glucose 122, BUN of 21, serum creatinine 0.91, EGFR greater than 60.  Hospitalist service was consulted for altered mentation and fever.  Orthopedic service concerned with too much opioids and hydrocodone -acetaminophen  5-325 mg, 1 to 2 tablets have been discontinued. -------------------------------------- At bedside, feels hot to the touch.  Patient is able to tell me her first and last name, her current location of hospital.  She was not able to tell me her age.  She initially states that she is 78 years old and then she states she is 78 years old.  She was not able to tell me the current calendar year.  She states the current month is May correctly.  She reports that she has neck pain and that her head hurts.  ROS: Unable to complete due to acuity of patient presentation  Assessment/Plan  Principal Problem:    Headache Active Problems:   Fever   Mixed hyperlipidemia   Essential hypertension   Obesity (BMI 30-39.9)   CAD (coronary artery disease)   Osteoarthritis of right hip joint due to dysplasia   S/P total right hip arthroplasty   Assessment and Plan:  * Headache And neck pain with fever At this time nosocomial meningitis cannot be excluded at this time Blood cultures x 2 have been ordered Infectious disease specialist has been consulted Vancomycin, cefepime, azithromycin IV ordered IR evaluate and manage for urgent lumbar puncture placed CSF fluid orders with culture, Gram stain, cell count, differential, cytology, protein, glucose ordered Discussed with infectious disease specialist, IR specialist  Addendum: Infectious disease specialist saw the patient and does not think the patient has meningitis.  Patient does have encephalopathy however given patient's mentation decline, difficult to assess.  Fever likely postop versus pneumonia.  Fever Symptomatic support, as needed acetaminophen  ordered  Essential hypertension Home Coreg  6.25 mg p.o. twice daily, Entresto  24-26 mg,  spironolactone  12.5 mg daily  Chart reviewed.   Case discussed with primary team, infectious disease specialist, radiology specialist for lumbar puncture.  Thank you for allowing us  to participate in the care of Audrey Martinez.  [facility] team will continue to follow.  DVT prophylaxis: Per primary team Code Status: Full code Diet: Agree with primary team, regular diet Family Communication: discussed with son, Audrey Martinez at [phone] Disposition Plan: Pending orthopedic service/primary service  Past Medical History:  Diagnosis Date   Actinic keratosis    Acute renal failure (ARF) (HCC) 01/31/2020   Acute respiratory failure with hypoxia (HCC) 01/29/2020   Amaurosis fugax, right eye 12/2018  Aortic atherosclerosis (HCC)    Arthritis    Asthma    Atrial tachycardia (HCC)    a.) s/p  RFCA for AVNRT in 2008 at [facility] [place]    Basal cell carcinoma 07/2016   central forehead (tx in [place] )   Bilateral pulmonary embolism (HCC) 01/28/2020   a.) in setting of SARS-CoV-2 infection (12/2019); noted BILATERALLY with (+) associated RIGHT heart strain; Tx'd with rivaroxaban  Xarelto  20 mg daily x 6 months then PRN   Cerebellar infarct (RIGHT) 1998   Coronary artery disease    DDD (degenerative disc disease), cervical    Dyspnea    Facial laceration 06/16/2022   Date of injury: 06/08/2022  [facility] ED history below:  78 year old female presenting to the emergency department after mechanical, nonsyncopal fall causing her to strike her face on the edge/corner of a curio cabinet and sustaining a deep and approximate 8 cm laceration to the right cheek in an irregular pattern to the angle of the right lower jaw. Wound does not proceed th   GERD (gastroesophageal reflux disease)    Herniated disc, lumbar    HFrEF (heart failure with reduced ejection fraction) (HCC)    History of 2019 novel coronavirus disease (COVID-19) 12/2019   History of bilateral cataract extraction    Hyperlipidemia    Incomplete left bundle branch block (LBBB)    Left wrist fracture    Lumbar stenosis    s/p LEFT L2-4 hemilaminectomies and partial facetectomies, foraminotomies for lateral recess and foraminal decompression of L3 and L4 nerve roots   Melanoma (HCC) 05/12/2020   Melanoma IS R post neck, excised 08/10/20   Mobitz type 1 second degree atrioventricular block    a.) evaluated by EP --> paroxysmal in nature; nocturnal epidoe felt to be vagally mediated   NICM (nonischemic cardiomyopathy) (HCC)    a. 04/2018 Echo: EF 45-50%. Anteroseptal and apical HK in some views. Mild AI/MR. Nl RV fxn; b. 05/2018 MV: mid-dist ant and antsept mild defect - ? breast atten. EF 41%. No ischemia; c. 03/2019 Echo: EF 30-35%, Gr2 DD, Trace MR, triv TR. Asc Ao ectatic dil - 38mm.     Osteoporosis    Personal history of transient ischemic attack (TIA), and cerebral infarction without residual deficits 04/22/2003   Pneumonia due to COVID-19 virus 01/26/2020   SVT (supraventricular tachycardia) (HCC)    a.2008 s/p RFCA for AVNRT; b. 9 & 02/2019 Zio x 2: 1. Avg HR 89, 3 runs NSVT (longest 7 beats), Mobitz 1. 2. Avg HR 89, NSVT x 1 (5 beats), Mobitz 1. No signif arrhythmia.   Tendinitis of right knee 07/03/2017   Past Surgical History:  Procedure Laterality Date   APPENDECTOMY     BREAST CYST ASPIRATION Left 1977   CARDIAC CATHETERIZATION  2009   [facility] [place] [facility]   CARDIAC ELECTROPHYSIOLOGY STUDY AND ABLATION  2009   CATARACT EXTRACTION, BILATERAL  2010   COLONOSCOPY WITH PROPOFOL  N/A 03/21/2018   Procedure: COLONOSCOPY WITH Biopsy;  Surgeon: Selena Daily, MD;  Location: [facility];  Service: Endoscopy;  Laterality: N/A;   COLONOSCOPY WITH PROPOFOL  N/A 05/03/2021   Procedure: COLONOSCOPY WITH PROPOFOL ;  Surgeon: Selena Daily, MD;  Location: [facility] ENDOSCOPY;  Service: Gastroenterology;  Laterality: N/A;   CORONARY ANGIOPLASTY     DARRACH'S PROCEDURE Bilateral 1977   ESOPHAGOGASTRODUODENOSCOPY  2013   gastritis; done for complaint of dysphagia   ESOPHAGOGASTRODUODENOSCOPY (EGD) WITH PROPOFOL  N/A 03/21/2018   Procedure: ESOPHAGOGASTRODUODENOSCOPY (EGD)  WITH Biopsies;  Surgeon: Selena Daily, MD;  Location: [facility] SUR[facility]TR;  Service: Endoscopy;  Laterality: N/A;  KEEP THIS PATIENT FIRST   JOINT REPLACEMENT     LEFT HEART CATH AND CORONARY ANGIOGRAPHY Left 05/02/2019   Procedure: LEFT HEART CATH AND CORONARY ANGIOGRAPHY;  Surgeon: Devorah Fonder, MD;  Location: [facility] INVASIVE CV LAB;  Service: Cardiovascular;  Laterality: Left;   LUMBAR LAMINECTOMY  2017   Procedure: LUMBAR LAMINECTOMY (L2-L4)   LYMPH NODE REMOVAL (NECK)     MOHS SURGERY  07/2016   BCCA nose   POLYPECTOMY N/A 03/21/2018   Procedure:  POLYPECTOMY;  Surgeon: Selena Daily, MD;  Location: [facility];  Service: Endoscopy;  Laterality: N/A;   REPLACEMENT TOTAL KNEE Right 2013   TONSILLECTOMY     TOTAL HIP ARTHROPLASTY Right 10/09/2023   Procedure: ARTHROPLASTY, HIP, TOTAL, ANTERIOR APPROACH;  Surgeon: Audrey Ginsberg, MD;  Location: [facility] ORS;  Service: Orthopedics;  Laterality: Right;   TOTAL SHOULDER REPLACEMENT Right 2012   Social History:  reports that she has never smoked. She has never used smokeless tobacco. She reports that she does not currently use alcohol. She reports that she does not use drugs.  Allergies  Allergen Reactions   Amoxicillin Hives, Shortness Of Breath and Swelling    Has tolerated a 1st generation cephalosporin (CEFAZOLIN) in the past without documented ADRs.    Aspirin Anaphylaxis and Shortness Of Breath   Bacitracin-Polymyx-Neo-Hc [Bacitra-Neomycin-Polymyxin-Hc] Rash   Ciprofloxacin Rash   Ciprofloxacin-Dexamethasone  Anaphylaxis   Crestor  [Rosuvastatin ] Other (See Comments)    Caused problems with memory.   Erythromycin Hives and Rash   Hydrochlorothiazide Other (See Comments) and Palpitations    Raises her blood pressure.    Ibuprofen Other (See Comments)    Sharp stomach pains   Morphine And Codeine Nausea And Vomiting   Morpholine Salicylate Nausea Only   Omeprazole Palpitations and Other (See Comments)    Tachycardia and same sensation as her SVTs per patient   Doxycycline Hyclate Hives   Keflex [Cephalexin] Hives    Has tolerated a 1st generation cephalosporin (CEFAZOLIN) in the past without documented ADRs.     Lovastatin Other (See Comments)    "Unable to function"   Simvastatin Other (See Comments)    "Unable to function"   Silicone Dermatitis and Hives   Neomycin-Bacitracin Zn-Polymyx Hives and Rash   Tape Hives and Rash   Family History  Problem Relation Age of Onset   Diabetes Mother    Stroke Mother    Valvular heart disease Father    CAD Sister 30        died of MI   Stroke Sister    Stroke Sister 49   Clotting disorder Half-Sister    Clotting disorder Half-Sister    Breast cancer Neg Hx    Family history: Family history reviewed and not pertinent.  Prior to Admission medications   Medication Sig Start Date End Date Taking? Authorizing Provider  B Complex-C (B-COMPLEX WITH VITAMIN C) tablet Take 1 tablet by mouth daily.    Yes [provider]  baclofen  (LIORESAL ) 10 MG tablet Take 10 mg by mouth 3 (three) times daily.   Yes [provider]  calcium  carbonate (OS-CAL - DOSED IN MG OF ELEMENTAL CALCIUM ) 1250 (500 Ca) MG tablet Take 1 tablet by mouth daily with breakfast.   Yes [provider]  carvedilol  (COREG ) 6.25 MG tablet Take 1 tablet (6.25 mg total) by mouth 2 (two) times daily with  a meal. 01/10/23  Yes Gollan, Timothy J, MD  cholecalciferol  (VITAMIN D ) 25 MCG (1000 UT) tablet Take 1,000 Units by mouth daily.   Yes [provider]  Cranberry 1000 MG CAPS Take 1,000 mg by mouth daily.    Yes [provider]  ezetimibe  (ZETIA ) 10 MG tablet Take 1 tablet (10 mg total) by mouth daily. 07/06/23  Yes Audrey Dixons, MD  fluticasone  (FLOVENT  HFA) 110 MCG/ACT inhaler Inhale 1 puff into the lungs 2 (two) times daily as needed.   Yes [provider]  gabapentin  (NEURONTIN ) 300 MG capsule Take 300-900 mg by mouth See admin instructions. Take 300 mg three times a day and 900 mg at bedtime   Yes [provider]  HYDROcodone -acetaminophen  (NORCO/VICODIN) 5-325 MG tablet Take 1 tablet by mouth every 8 (eight) hours as needed.   Yes [provider]  loratadine (CLARITIN) 10 MG tablet Take 10 mg by mouth daily as needed for allergies.   Yes [provider]  Multiple Vitamins-Minerals (MULTIVITAMIN WITH MINERALS) tablet Take 1 tablet by mouth daily. Centrum Silver   Yes [provider]  sacubitril-valsartan (ENTRESTO ) 24-26 MG TAKE 1 TABLET BY MOUTH TWICE A DAY  02/24/23  Yes Gollan, Timothy J, MD  spironolactone  (ALDACTONE ) 25 MG tablet TAKE 1 TABLET (25 MG TOTAL) BY MOUTH DAILY. Patient taking differently: Take 12.5 mg by mouth daily. 09/22/23  Yes Boyce Byes, MD  tiZANidine (ZANAFLEX) 2 MG tablet Take 2 mg by mouth every 8 (eight) hours as needed for muscle spasms. 09/22/23 09/21/24 Yes [provider]  Turmeric 500 MG CAPS Take 500 mg by mouth daily.    Yes [provider]  zinc  sulfate 220 (50 Zn) MG capsule Take 1 capsule (220 mg total) by mouth daily. 02/02/20  Yes Claretta Croft, MD   Physical Exam: Vitals:   10/11/23 0918 10/11/23 1153 10/11/23 1311 10/11/23 1454  BP: 121/76 (!) 101/59 117/68 106/66  Pulse: (!) 103 97 96 95  Resp: 16 17 16 15   Temp: 98.7 F (37.1 C) 98.4 F (36.9 C) 98.5 F (36.9 C) 98.7 F (37.1 C)  TempSrc: Oral Axillary Axillary Axillary  SpO2: 91% 92% 96% 93%  Weight:      Height:       Constitutional: appears frail, acutely confused Eyes: PERRL, lids and conjunctivae normal ENMT: Mucous membranes are moist. Posterior pharynx clear of any exudate or lesions. Age-appropriate dentition. Hearing appropriate Neck: normal, supple, no masses, no thyromegaly Respiratory: clear to auscultation bilaterally, no wheezing, no crackles. Normal respiratory effort. No accessory muscle use.  Cardiovascular: Regular rate and rhythm, no murmurs / rubs / gallops. No extremity edema. 2+ pedal pulses. No carotid bruits.  Abdomen: obese abdomen, no tenderness, no masses palpated, no hepatosplenomegaly. Bowel sounds positive.  Musculoskeletal: no clubbing / cyanosis. No joint deformity upper and lower extremities.  Decreased range of motion at the right lower extremity ROM, no contractures, no atrophy. Normal muscle tone.  Skin: no rashes, lesions, ulcers. No induration Neurologic: Sensation intact. Strength 5/5 in all 4.  Psychiatric: Normal judgment and insight. Alert and oriented x 3. Normal mood.   EKG: Read  and pending completion  Chest x-ray on Admission: I personally reviewed and I agree with radiologist reading as below.  DG Chest 2 View Result Date: 10/11/2023 CLINICAL DATA:  Fever. EXAM: CHEST - 2 VIEW COMPARISON:  Radiographs 03/03/2022 and 02/24/2022. Chest CT 01/28/2020. FINDINGS: Lower lung volumes with interval increased elevation of the right hemidiaphragm.  Allowing for this, the heart size and mediastinal contours are stable. There is increased vascular congestion and bibasilar atelectasis. In addition, there is suspicion of a left perihilar infiltrate, with increased density projecting over the midthoracic spine on the lateral view. No evidence of pneumothorax or significant pleural effusion. Previous right shoulder reverse arthroplasty without acute osseous abnormality. IMPRESSION: 1. Lower lung volumes with increased vascular congestion and bibasilar atelectasis. 2. Suspected left perihilar infiltrate, suspicious for pneumonia. Followup PA and lateral chest X-ray is recommended in 4-6 weeks following appropriate therapy to ensure resolution and exclude underlying malignancy. Electronically Signed   By: Elmon Hagedorn M.D.   On: 10/11/2023 09:58   Labs on Admission: I have personally reviewed following labs  CBC: Recent Labs  Lab 10/10/23 0628 10/11/23 0626  WBC 14.3* 9.7  HGB 12.8 12.4  HCT 38.7 38.0  MCV 92.4 92.7  PLT 169 149*   Basic Metabolic Panel: Recent Labs  Lab 10/10/23 0628  NA 140  K 4.3  CL 107  CO2 25  GLUCOSE 122*  BUN 21  CREATININE 0.91  CALCIUM  9.3   GFR: Estimated Creatinine Clearance: 54.6 mL/min (by C-G formula based on SCr of 0.91 mg/dL).  Urine analysis:    Component Value Date/Time   COLORURINE AMBER (A) 10/03/2023 1401   APPEARANCEUR CLOUDY (A) 10/03/2023 1401   LABSPEC 1.019 10/03/2023 1401   PHURINE 7.0 10/03/2023 1401   GLUCOSEU 150 (A) 10/03/2023 1401   HGBUR NEGATIVE 10/03/2023 1401   BILIRUBINUR NEGATIVE 10/03/2023 1401    BILIRUBINUR neg 11/25/2019 1115   KETONESUR NEGATIVE 10/03/2023 1401   PROTEINUR NEGATIVE 10/03/2023 1401   UROBILINOGEN 0.2 11/25/2019 1115   NITRITE NEGATIVE 10/03/2023 1401   LEUKOCYTESUR TRACE (A) 10/03/2023 1401   CRITICAL CARE Performed by: Dr. Reinhold Carbine  Total critical care time: 32 minutes  Critical care time was exclusive of separately billable procedures and treating other patients.  Critical care was necessary to treat or prevent imminent or life-threatening deterioration.  Critical care was time spent personally by me on the following activities: development of treatment plan with son, Tonya Corgan, as well as nursing, discussions with consultants, evaluation of patient's response to treatment, examination of patient, obtaining history from patient or surrogate, ordering and performing treatments and interventions, ordering and review of laboratory studies, ordering and review of radiographic studies, pulse oximetry and re-evaluation of patient's condition.  This document was prepared using Dragon Voice Recognition software and may include unintentional dictation errors.  Dr. Reinhold Carbine [facility]  If 7PM-7AM, please contact overnight-coverage provider If 7AM-7PM, please contact day attending provider [email or web address]  10/11/2023, 6:19 PM

## 2023-10-11 NOTE — Progress Notes (Signed)
   10/11/23 0801  Vitals  Temp (!) 101.1 F (38.4 C)  Temp Source Oral  BP 121/75  MAP (mmHg) 87  BP Location Left Arm  BP Method Automatic  Patient Position (if appropriate) Lying  Pulse Rate (!) 128  Pulse Rate Source Dinamap  Resp 15  Level of Consciousness  Level of Consciousness Alert  MEWS COLOR  MEWS Score Color Yellow  Oxygen Therapy  SpO2 92 %  O2 Device Room Air  MEWS Score  MEWS Temp 1  MEWS Systolic 0  MEWS Pulse 2  MEWS RR 0  MEWS LOC 0  MEWS Score 3  Provider Notification  Provider Name/Title Thomos Flies, PA  Date Provider Notified 10/11/23  Time Provider Notified 0803  Method of Notification Rounds  Notification Reason Change in status  Provider response At bedside  Date of Provider Response 10/11/23  Time of Provider Response [phone]

## 2023-10-11 NOTE — Progress Notes (Signed)
 PT Cancellation Note  Patient Details Name: Audrey Martinez MRN: [id number] DOB: May 21, 1946   Cancelled Treatment:    Reason Eval/Treat Not Completed: Other (comment): Nursing requested PT to be held this afternoon secondary to pt being lethargic and confused.  Will attempt to see pt at a future date/time as medically appropriate.     Lavenia Post PT, DPT 10/11/23, 3:56 PM

## 2023-10-12 DIAGNOSIS — E782 Mixed hyperlipidemia: Secondary | ICD-10-CM | POA: Diagnosis present

## 2023-10-12 DIAGNOSIS — Z8249 Family history of ischemic heart disease and other diseases of the circulatory system: Secondary | ICD-10-CM | POA: Diagnosis not present

## 2023-10-12 DIAGNOSIS — G9389 Other specified disorders of brain: Secondary | ICD-10-CM | POA: Diagnosis present

## 2023-10-12 DIAGNOSIS — M1611 Unilateral primary osteoarthritis, right hip: Secondary | ICD-10-CM | POA: Diagnosis present

## 2023-10-12 DIAGNOSIS — G9340 Encephalopathy, unspecified: Secondary | ICD-10-CM | POA: Diagnosis not present

## 2023-10-12 DIAGNOSIS — I700 Atherosclerosis of aorta: Secondary | ICD-10-CM | POA: Diagnosis present

## 2023-10-12 DIAGNOSIS — Z1152 Encounter for screening for COVID-19: Secondary | ICD-10-CM | POA: Diagnosis not present

## 2023-10-12 DIAGNOSIS — I2510 Atherosclerotic heart disease of native coronary artery without angina pectoris: Secondary | ICD-10-CM | POA: Diagnosis present

## 2023-10-12 DIAGNOSIS — I4892 Unspecified atrial flutter: Secondary | ICD-10-CM | POA: Diagnosis present

## 2023-10-12 DIAGNOSIS — I428 Other cardiomyopathies: Secondary | ICD-10-CM | POA: Diagnosis present

## 2023-10-12 DIAGNOSIS — G928 Other toxic encephalopathy: Secondary | ICD-10-CM | POA: Diagnosis not present

## 2023-10-12 DIAGNOSIS — Z833 Family history of diabetes mellitus: Secondary | ICD-10-CM | POA: Diagnosis not present

## 2023-10-12 DIAGNOSIS — M25551 Pain in right hip: Secondary | ICD-10-CM | POA: Diagnosis present

## 2023-10-12 DIAGNOSIS — Z6834 Body mass index (BMI) 34.0-34.9, adult: Secondary | ICD-10-CM | POA: Diagnosis not present

## 2023-10-12 DIAGNOSIS — N1831 Chronic kidney disease, stage 3a: Secondary | ICD-10-CM | POA: Diagnosis present

## 2023-10-12 DIAGNOSIS — E669 Obesity, unspecified: Secondary | ICD-10-CM | POA: Diagnosis present

## 2023-10-12 DIAGNOSIS — J9811 Atelectasis: Secondary | ICD-10-CM | POA: Diagnosis not present

## 2023-10-12 DIAGNOSIS — I130 Hypertensive heart and chronic kidney disease with heart failure and stage 1 through stage 4 chronic kidney disease, or unspecified chronic kidney disease: Secondary | ICD-10-CM | POA: Diagnosis present

## 2023-10-12 DIAGNOSIS — R519 Headache, unspecified: Secondary | ICD-10-CM | POA: Diagnosis not present

## 2023-10-12 DIAGNOSIS — I5022 Chronic systolic (congestive) heart failure: Secondary | ICD-10-CM | POA: Diagnosis present

## 2023-10-12 DIAGNOSIS — Z7901 Long term (current) use of anticoagulants: Secondary | ICD-10-CM | POA: Diagnosis not present

## 2023-10-12 DIAGNOSIS — Z85820 Personal history of malignant melanoma of skin: Secondary | ICD-10-CM | POA: Diagnosis not present

## 2023-10-12 DIAGNOSIS — F0280 Dementia in other diseases classified elsewhere without behavioral disturbance: Secondary | ICD-10-CM | POA: Diagnosis present

## 2023-10-12 DIAGNOSIS — Z8616 Personal history of COVID-19: Secondary | ICD-10-CM | POA: Diagnosis not present

## 2023-10-12 DIAGNOSIS — J45909 Unspecified asthma, uncomplicated: Secondary | ICD-10-CM | POA: Diagnosis present

## 2023-10-12 DIAGNOSIS — Z7951 Long term (current) use of inhaled steroids: Secondary | ICD-10-CM | POA: Diagnosis not present

## 2023-10-12 DIAGNOSIS — G309 Alzheimer's disease, unspecified: Secondary | ICD-10-CM | POA: Diagnosis present

## 2023-10-12 LAB — CBC
HCT: 33.2 % — ABNORMAL LOW (ref 36.0–46.0)
Hemoglobin: 11.2 g/dL — ABNORMAL LOW (ref 12.0–15.0)
MCH: 31 pg (ref 26.0–34.0)
MCHC: 33.7 g/dL (ref 30.0–36.0)
MCV: 92 fL (ref 80.0–100.0)
Platelets: 155 10*3/uL (ref 150–400)
RBC: 3.61 MIL/uL — ABNORMAL LOW (ref 3.87–5.11)
RDW: 12.2 % (ref 11.5–15.5)
WBC: 7.1 10*3/uL (ref 4.0–10.5)
nRBC: 0 % (ref 0.0–0.2)

## 2023-10-12 LAB — URINALYSIS, COMPLETE (UACMP) WITH MICROSCOPIC
Bacteria, UA: NONE SEEN
Bilirubin Urine: NEGATIVE
Glucose, UA: NEGATIVE mg/dL
Hgb urine dipstick: NEGATIVE
Ketones, ur: NEGATIVE mg/dL
Leukocytes,Ua: NEGATIVE
Nitrite: NEGATIVE
Protein, ur: 30 mg/dL — AB
Specific Gravity, Urine: 1.029 (ref 1.005–1.030)
pH: 5 (ref 5.0–8.0)

## 2023-10-12 LAB — BASIC METABOLIC PANEL WITH GFR
Anion gap: 7 (ref 5–15)
BUN: 20 mg/dL (ref 8–23)
CO2: 25 mmol/L (ref 22–32)
Calcium: 8.8 mg/dL — ABNORMAL LOW (ref 8.9–10.3)
Chloride: 106 mmol/L (ref 98–111)
Creatinine, Ser: 0.91 mg/dL (ref 0.44–1.00)
GFR, Estimated: >60 mL/min (ref >60)
Glucose, Bld: 94 mg/dL (ref 70–99)
Potassium: 4.2 mmol/L (ref 3.5–5.1)
Sodium: 138 mmol/L (ref 135–145)

## 2023-10-12 LAB — SARS CORONAVIRUS 2 BY RT PCR: SARS Coronavirus 2 by RT PCR: NEGATIVE

## 2023-10-12 LAB — PROCALCITONIN: Procalcitonin: 0.11 ng/mL

## 2023-10-12 MED ORDER — ENOXAPARIN SODIUM 40 MG/0.4ML IJ SOSY: 40.0000 mg | PREFILLED_SYRINGE | INTRAMUSCULAR | 0 refills | Status: DC

## 2023-10-12 MED ORDER — CARVEDILOL 3.125 MG PO TABS
ORAL_TABLET | ORAL | Status: AC
  Filled 2023-10-12: qty 2

## 2023-10-12 MED ORDER — VANCOMYCIN HCL 1.25 G IV SOLR
1250.0000 mg | INTRAVENOUS | Status: DC
  Filled 2023-10-12: qty 25

## 2023-10-12 MED ORDER — POLYETHYLENE GLYCOL 3350 17 G PO PACK
17.0000 g | PACK | Freq: Every day | ORAL | Status: DC
  Administered 2023-10-12 – 2023-10-13 (×2): 17 g via ORAL
  Filled 2023-10-12 (×2): qty 1

## 2023-10-12 MED ORDER — ONDANSETRON HCL 4 MG PO TABS: 4.0000 mg | ORAL_TABLET | Freq: Four times a day (QID) | ORAL | Status: DC | PRN

## 2023-10-12 MED ORDER — VANCOMYCIN HCL 1250 MG/250ML IV SOLN
1250.0000 mg | INTRAVENOUS | Status: DC
  Filled 2023-10-12: qty 250

## 2023-10-12 MED ORDER — ACETAMINOPHEN 325 MG PO TABS: 325.0000 mg | ORAL_TABLET | Freq: Four times a day (QID) | ORAL | Status: AC | PRN

## 2023-10-12 MED ORDER — DOCUSATE SODIUM 100 MG PO CAPS: 100.0000 mg | ORAL_CAPSULE | Freq: Two times a day (BID) | ORAL | Status: DC

## 2023-10-12 MED ORDER — GABAPENTIN 300 MG PO CAPS
ORAL_CAPSULE | ORAL | Status: AC
  Filled 2023-10-12: qty 1

## 2023-10-12 MED ORDER — TRAMADOL HCL 50 MG PO TABS: 50.0000 mg | ORAL_TABLET | Freq: Four times a day (QID) | ORAL | 0 refills | Status: DC | PRN

## 2023-10-12 MED ORDER — EZETIMIBE 10 MG PO TABS
ORAL_TABLET | ORAL | Status: AC
  Filled 2023-10-12: qty 1

## 2023-10-12 NOTE — Plan of Care (Signed)

## 2023-10-12 NOTE — Progress Notes (Signed)
 Physical Therapy Treatment Patient Details Name: Audrey Martinez MRN: [id number] DOB: [birth year] Today's Date: 10/12/2023   History of Present Illness Pt is a 78 yo female diagnosed with right hip OA and is s/p elective R THA.  PMH includes: SDH, asthma, SVT, R TKA, HTN, CAD, and A-fib.    PT Comments  Pt was long sitting in bed upon arrival. She is alert but pleasantly confused. HR around 100 bpm at rest that elevated only to 110 bpm during activity. BP 121/76 while seated EOB prior to ambulation. Pt endorses 7/10 pian in Hip. She was able to stand 3 x EOIB prior to ambulating ~ 45 ft with antalgic step to gait pattern. No LOB. Pt seemed to tolerate well however once back in bed, c/o slight chest pain (inferior sternum area) that Bolivar Bushman feels is due to coughing. BP remains stable.Pt minimally SOB, HR remains in upper 90s at rest. Pt resting comfortable with RN in room. Dc recs remain appropriate. Acute PT will continue to follow per current POC.      If plan is discharge home, recommend the following: A lot of help with walking and/or transfers;A lot of help with bathing/dressing/bathroom;Assistance with cooking/housework;Assistance with feeding;Direct supervision/assist for medications management;Direct supervision/assist for financial management;Assist for transportation;Help with stairs or ramp for entrance;Supervision due to cognitive status     Equipment Recommendations  Rolling walker (2 wheels);BSC/3in1       Precautions / Restrictions Precautions Precautions: Fall;Anterior Hip Precaution Booklet Issued: Yes (comment) Recall of Precautions/Restrictions: Impaired Restrictions Weight Bearing Restrictions Per Provider Order: Yes RLE Weight Bearing Per Provider Order: Weight bearing as tolerated     Mobility  Bed Mobility Overal bed mobility: Needs Assistance Bed Mobility: Supine to Sit, Sit to Supine  Supine to sit: Mod assist, Max assist Sit to supine: Mod assist, Max assist    Transfers Overall transfer level: Needs assistance Equipment used: Rolling walker (2 wheels) Transfers: Sit to/from Stand Sit to Stand: Min assist, Mod assist, From elevated surface  General transfer comment: min from elevated bed height but mod assist from lower surface    Ambulation/Gait Ambulation/Gait assistance: Contact guard assist Gait Distance (Feet): 45 Feet Assistive device: Rolling walker (2 wheels) Gait Pattern/deviations: Step-to pattern, Trunk flexed Gait velocity: decreased  General Gait Details: Pt was able to tolerate ambulation ~ 45 ft with RW. antalgic step to gait    Balance Overall balance assessment: Needs assistance Sitting-balance support: No upper extremity supported, Feet supported Sitting balance-Leahy Scale: Good     Standing balance support: Bilateral upper extremity supported, During functional activity, Reliant on assistive device for balance Standing balance-Leahy Scale: Fair Standing balance comment: reliant on RW       Communication Communication Communication: No apparent difficulties  Cognition Arousal: Alert Behavior During Therapy: WFL for tasks assessed/performed   PT - Cognitive impairments: History of cognitive impairments, No family/caregiver present to determine baseline      PT - Cognition Comments: Pt is aklert and cooperative but pleasantly confused. Oriented x 2 only. Following commands: Intact      Cueing Cueing Techniques: Verbal cues, Tactile cues         Pertinent Vitals/Pain Pain Assessment Pain Assessment: 0-10 Pain Score: 7  Pain Location: R hip Pain Descriptors / Indicators: Sore Pain Intervention(s): Limited activity within patient's tolerance, Monitored during session, Premedicated before session, Repositioned     PT Goals (current goals can now be found in the care plan section) Acute Rehab PT Goals Patient Stated Goal: none stated  Progress towards PT goals: Progressing toward goals     Frequency    BID       AM-PAC PT "6 Clicks" Mobility   Outcome Measure  Help needed turning from your back to your side while in a flat bed without using bedrails?: A Little Help needed moving from lying on your back to sitting on the side of a flat bed without using bedrails?: A Lot Help needed moving to and from a bed to a chair (including a wheelchair)?: A Lot Help needed standing up from a chair using your arms (e.g., wheelchair or bedside chair)?: A Little Help needed to walk in hospital room?: A Little Help needed climbing 3-5 steps with a railing? : A Lot 6 Click Score: 15    End of Session   Activity Tolerance: Patient tolerated treatment well Patient left: in bed;with call bell/phone within reach;with bed alarm set;with nursing/sitter in room Nurse Communication: Mobility status;Weight bearing status PT Visit Diagnosis: Other abnormalities of gait and mobility (R26.89);Muscle weakness (generalized) (M62.81);Pain     Time: 7616-0737 PT Time Calculation (min) (ACUTE ONLY): 36 min  Charges:    $Gait Training: 8-22 mins $Therapeutic Activity: 8-22 mins PT General Charges $$ ACUTE PT VISIT: 1 Visit                    Chester Costa PTA 10/12/23, 8:33 AM

## 2023-10-12 NOTE — Progress Notes (Signed)
 Subjective: 3 Days Post-Op Procedure(s) (LRB): ARTHROPLASTY, HIP, TOTAL, ANTERIOR APPROACH (Right) Patient reports pain as mild.   Patient is well, and has had no acute complaints or problems Denies any CP, SOB, ABD pain. We will continue therapy today.  Plan is to go Skilled nursing facility after hospital stay.  Objective: Vital signs in last 24 hours: Temp:  [98.4 F (36.9 C)-98.7 F (37.1 C)] 98.4 F (36.9 C) (05/15 0743) Pulse Rate:  [91-97] 91 (05/15 0743) Resp:  [14-17] 16 (05/15 0743) BP: (101-117)/(59-68) 104/60 (05/15 0743) SpO2:  [92 %-96 %] 93 % (05/15 0743)  Intake/Output from previous day: 05/14 0701 - 05/15 0700 In: 279.9 [IV Piggyback:279.9] Out: 800 [Urine:800] Intake/Output this shift: No intake/output data recorded.  Recent Labs    10/10/23 0628 10/11/23 0626 10/11/23 1752 10/12/23 0655  HGB 12.8 12.4 11.4* 11.2*   Recent Labs    10/11/23 1752 10/12/23 0655  WBC 9.7 7.1  RBC 3.75* 3.61*  HCT 34.3* 33.2*  PLT 164 155   Recent Labs    10/11/23 1752 10/12/23 0655  NA 139 138  K 3.9 4.2  CL 108 106  CO2 23 25  BUN 19 20  CREATININE 0.85 0.91  GLUCOSE 107* 94  CALCIUM  8.5* 8.8*   No results for input(s): "LABPT", "INR" in the last 72 hours.  EXAM General - Patient is Alert, Appropriate, and Oriented Extremity - Neurovascular intact Sensation intact distally Intact pulses distally Dorsiflexion/Plantar flexion intact Dressing - dressing C/D/I and no drainage Motor Function - intact, moving foot and toes well on exam.   Past Medical History:  Diagnosis Date   Actinic keratosis    Acute renal failure (ARF) (HCC) 01/31/2020   Acute respiratory failure with hypoxia (HCC) 01/29/2020   Amaurosis fugax, right eye 12/2018   Aortic atherosclerosis (HCC)    Arthritis    Asthma    Atrial tachycardia (HCC)    a.) s/p RFCA for AVNRT in 2008 at [facility]    Basal cell carcinoma 07/2016   central forehead (tx  in [place] )   Bilateral pulmonary embolism (HCC) 01/28/2020   a.) in setting of SARS-CoV-2 infection (12/2019); noted BILATERALLY with (+) associated RIGHT heart strain; Tx'd with rivaroxaban  Xarelto  20 mg daily x 6 months then PRN   Cerebellar infarct (RIGHT) 1998   Coronary artery disease    DDD (degenerative disc disease), cervical    Dyspnea    Facial laceration 06/16/2022   Date of injury: 06/08/2022  [facility] ED history below:  78 year old female presenting to the emergency department after mechanical, nonsyncopal fall causing her to strike her face on the edge/corner of a curio cabinet and sustaining a deep and approximate 8 cm laceration to the right cheek in an irregular pattern to the angle of the right lower jaw. Wound does not proceed th   GERD (gastroesophageal reflux disease)    Herniated disc, lumbar    HFrEF (heart failure with reduced ejection fraction) (HCC)    History of 2019 novel coronavirus disease (COVID-19) 12/2019   History of bilateral cataract extraction    Hyperlipidemia    Incomplete left bundle branch block (LBBB)    Left wrist fracture    Lumbar stenosis    s/p LEFT L2-4 hemilaminectomies and partial facetectomies, foraminotomies for lateral recess and foraminal decompression of L3 and L4 nerve roots   Melanoma (HCC) 05/12/2020   Melanoma IS R post neck, excised 08/10/20   Mobitz type 1 second degree atrioventricular  block    a.) evaluated by EP --> paroxysmal in nature; nocturnal epidoe felt to be vagally mediated   NICM (nonischemic cardiomyopathy) (HCC)    a. 04/2018 Echo: EF 45-50%. Anteroseptal and apical HK in some views. Mild AI/MR. Nl RV fxn; b. 05/2018 MV: mid-dist ant and antsept mild defect - ? breast atten. EF 41%. No ischemia; c. 03/2019 Echo: EF 30-35%, Gr2 DD, Trace MR, triv TR. Asc Ao ectatic dil - 38mm.    Osteoporosis    Personal history of transient ischemic attack (TIA), and cerebral infarction without residual  deficits 04/22/2003   Pneumonia due to COVID-19 virus 01/26/2020   SVT (supraventricular tachycardia) (HCC)    a.2008 s/p RFCA for AVNRT; b. 9 & 02/2019 Zio x 2: 1. Avg HR 89, 3 runs NSVT (longest 7 beats), Mobitz 1. 2. Avg HR 89, NSVT x 1 (5 beats), Mobitz 1. No signif arrhythmia.   Tendinitis of right knee 07/03/2017    Assessment/Plan:   3 Days Post-Op Procedure(s) (LRB): ARTHROPLASTY, HIP, TOTAL, ANTERIOR APPROACH (Right) Principal Problem:   Headache Active Problems:   Mixed hyperlipidemia   Essential hypertension   Obesity (BMI 30-39.9)   Osteoarthritis of right hip joint due to dysplasia   CAD (coronary artery disease)   S/P total right hip arthroplasty   Fever  Estimated body mass index is 34.88 kg/m as calculated from the following:   Height as of this encounter: 5\' 2"  (1.575 m).   Weight as of this encounter: 86.5 kg. Advance diet Up with therapy Pain well controlled Labs are stable Vital signs are stable, afebrile Altered mental status -patient appears to be back to baseline.  Continue to avoid sedating medications Pneumonia -continue with antibiotics.  Fever resolved. Care management to assist with discharge to skilled nursing facility  DVT Prophylaxis - Lovenox , TED hose, and SCDs Weight-Bearing as tolerated to right leg   T. Thomos Flies, PA-C [facility] 10/12/2023, 10:03 AM

## 2023-10-12 NOTE — Progress Notes (Signed)
 Occupational Therapy Treatment Patient Details Name: Audrey Martinez MRN: [id number] DOB: [birth year] Today's Date: 10/12/2023   History of present illness Pt is a 78 yo female diagnosed with right hip OA and is s/p elective R THA.  PMH includes: SDH, asthma, SVT, R TKA, HTN, CAD, and A-fib.   OT comments  Pt is supine in bed on arrival. Pleasantly confusion, but agreeable to OT session. She endorses R hip pain, but did not state a number, noted to moan/grimace at times during activity and reports of pain below chest in the rib region on both sides with taking a deep breath on return to bed at end of session-nurse notified. Pt performed bed mobility with Max/Mod A for BLE management and max cueing for hand placement on bed rails with HOB elevated. Pt required Max A to don sock on RLE d/t pain and CGA for donning L sock via figure four. Anticipate Mod A for LB ADL performance. Bed height elevated and Min A needed for STS to RW, amb to bathroom and back with CGA, increased time and cueing for proximity to RW at times. MIN/CGA for toilet transfer with increased cueing for safety. CGA for standing peri-care and hand hygiene at sink. Edu on use of AE/AD including reacher, shoe horn and long handled sponge, although minimal to no carryover likely d/t confusion.  Pt returned to bed with all needs in place and will cont to require skilled acute OT services to maximize her safety and IND to return to PLOF.       If plan is discharge home, recommend the following:  A lot of help with bathing/dressing/bathroom;Assistance with feeding;Direct supervision/assist for medications management;Direct supervision/assist for financial management;Assist for transportation;Help with stairs or ramp for entrance;Assistance with cooking/housework;A little help with walking and/or transfers;A lot of help with walking and/or transfers   Equipment Recommendations  Other (comment) (defer)    Recommendations for Other  Services      Precautions / Restrictions Precautions Precautions: Fall;Anterior Hip Precaution Booklet Issued: Yes (comment) Recall of Precautions/Restrictions: Impaired Restrictions Weight Bearing Restrictions Per Provider Order: Yes RLE Weight Bearing Per Provider Order: Weight bearing as tolerated       Mobility Bed Mobility Overal bed mobility: Needs Assistance Bed Mobility: Supine to Sit, Sit to Supine     Supine to sit: Mod assist, Max assist, HOB elevated, Used rails Sit to supine: Mod assist, Max assist   General bed mobility comments: BLE management, cueing for bed rails use; HOB elevated    Transfers Overall transfer level: Needs assistance Equipment used: Rolling walker (2 wheels) Transfers: Sit to/from Stand Sit to Stand: Min assist, Mod assist, From elevated surface           General transfer comment: min a from EOB with it elevated and comfort height toilet in bathroom; amb in room with CGA, slow pace and RW dependence     Balance Overall balance assessment: Needs assistance Sitting-balance support: No upper extremity supported, Feet supported Sitting balance-Leahy Scale: Good     Standing balance support: Bilateral upper extremity supported, During functional activity, Reliant on assistive device for balance Standing balance-Leahy Scale: Fair Standing balance comment: dependent on RW for BUE support                           ADL either performed or assessed with clinical judgement   ADL Overall ADL's : Needs assistance/impaired     Grooming: Wash/dry hands;Standing;Contact guard assist  Lower Body Dressing: Moderate assistance;Sitting/lateral leans;Sit to/from stand Lower Body Dressing Details (indicate cue type and reason): pt able to doff/don sock on LLE, but needs Max A for RLE-operative leg Toilet Transfer: Cueing for safety;Cueing for sequencing;Contact guard assist;Ambulation;Rolling walker (2 wheels);Comfort  height toilet   Toileting- Clothing Manipulation and Hygiene: Contact guard assist;Sit to/from stand;Sitting/lateral lean;Cueing for compensatory techniques;Cueing for safety Toileting - Clothing Manipulation Details (indicate cue type and reason): anterior hygiene with cueing for safety     Functional mobility during ADLs: Contact guard assist;Cueing for safety;Rolling walker (2 wheels)      Extremity/Trunk Assessment              Vision       Perception     Praxis     Communication Communication Communication: No apparent difficulties   Cognition Arousal: Alert Behavior During Therapy: WFL for tasks assessed/performed Cognition: Cognition impaired             OT - Cognition Comments: knows she is in the hospital, but not which one, believed it is March                 Following commands: Intact        Cueing   Cueing Techniques: Verbal cues, Tactile cues  Exercises Other Exercises Other Exercises: edu on use of AE/AD for ADLs as needed with pain as guide-carryover unlikely d/t confusion    Shoulder Instructions       General Comments      Pertinent Vitals/ Pain       Pain Assessment Pain Assessment: Faces Faces Pain Scale: Hurts even more Pain Location: R hip, bil ribs below chest Pain Descriptors / Indicators: Sore Pain Intervention(s): Monitored during session, Repositioned, Limited activity within patient's tolerance (notified RN)  Home Living                                          Prior Functioning/Environment              Frequency  Min 3X/week        Progress Toward Goals  OT Goals(current goals can now be found in the care plan section)  Progress towards OT goals: Progressing toward goals  Acute Rehab OT Goals Patient Stated Goal: improve pain and function OT Goal Formulation: With patient Time For Goal Achievement: 10/24/23 Potential to Achieve Goals: Good  Plan      Co-evaluation                  AM-PAC OT "6 Clicks" Daily Activity     Outcome Measure   Help from another person eating meals?: A Little Help from another person taking care of personal grooming?: A Little Help from another person toileting, which includes using toliet, bedpan, or urinal?: A Little Help from another person bathing (including washing, rinsing, drying)?: A Lot Help from another person to put on and taking off regular upper body clothing?: A Little Help from another person to put on and taking off regular lower body clothing?: A Lot 6 Click Score: 16    End of Session Equipment Utilized During Treatment: Gait belt;Rolling walker (2 wheels)  OT Visit Diagnosis: Unsteadiness on feet (R26.81);Other abnormalities of gait and mobility (R26.89);Muscle weakness (generalized) (M62.81);Other symptoms and signs involving cognitive function   Activity Tolerance Patient tolerated treatment well   Patient Left with call bell/phone within reach;in  bed;with bed alarm set   Nurse Communication Mobility status        Time: [phone] OT Time Calculation (min): 35 min  Charges: OT General Charges $OT Visit: 1 Visit OT Treatments $Self Care/Home Management : 23-37 mins  Prabhjot Piscitello, OTR/L  10/12/23, 1:19 PM   Donney Caraveo E Shelonda Saxe 10/12/2023, 1:15 PM

## 2023-10-12 NOTE — Plan of Care (Signed)
 See chart

## 2023-10-12 NOTE — Consult Note (Addendum)
 Initial Consultation Note   Patient: Audrey Martinez ZOX:[id number] DOB: [birth year] PCP: Sheron Dixons, MD DOA: 10/09/2023 DOS: the patient was seen and examined on 10/12/2023 Primary service: Venus Ginsberg, MD  Referring physician: Francenia Ingle, PA Reason for consult: Altered mental status, fever  Subjective Reports feeling well. No ha or neck pain/stiffness. No cough or sob. No abd pain. No n/v/d. No dysuria.  Assessment/Plan  Principal Problem:   Headache Active Problems:   Fever   Mixed hyperlipidemia   Essential hypertension   Obesity (BMI 30-39.9)   HFrEF (heart failure with reduced ejection fraction) (HCC)   CAD (coronary artery disease)   Chronic kidney disease, stage 3a (HCC)   Atrial flutter (HCC)   Osteoarthritis of right hip joint due to dysplasia   Personal history of PE (pulmonary embolism)   S/P total right hip arthroplasty   Assessment and Plan:  # Fever Isolated, post-op. Ha and neck pain resolved, unlikely meningitis. Possible atelectasis, does have bibasilar rales. Started on abx with ID following. No other focal symptoms. BCs are cooking, obtained after abx. No urinalysis performed, asymptomatic, given now on abx and asymptomatic probably little utility in obtaining now - continue abx per ID - check covid/rvp  # SVT Rate controlled, follows w/ cardiology - cont home coreg   # HFrEF Appears compensated. BPs soft - continue home spiro, coreg , entresto  as BPs allow  # History PE Appears this was in the setting of covid in 2021, doesn't appear she is currently anticoagulated - monitor for s/s PE as she is currently at increased risk - favor continuing chemical dvt ppx as you are    Past Medical History:  Diagnosis Date   Actinic keratosis    Acute renal failure (ARF) (HCC) 01/31/2020   Acute respiratory failure with hypoxia (HCC) 01/29/2020   Amaurosis fugax, right eye 12/2018   Aortic atherosclerosis (HCC)    Arthritis    Asthma     Atrial tachycardia (HCC)    a.) s/p RFCA for AVNRT in 2008 at [facility] [place]    Basal cell carcinoma 07/2016   central forehead (tx in [place] )   Bilateral pulmonary embolism (HCC) 01/28/2020   a.) in setting of SARS-CoV-2 infection (12/2019); noted BILATERALLY with (+) associated RIGHT heart strain; Tx'd with rivaroxaban  Xarelto  20 mg daily x 6 months then PRN   Cerebellar infarct (RIGHT) 1998   Coronary artery disease    DDD (degenerative disc disease), cervical    Dyspnea    Facial laceration 06/16/2022   Date of injury: 06/08/2022  [facility] ED history below:  78 year old female presenting to the emergency department after mechanical, nonsyncopal fall causing her to strike her face on the edge/corner of a curio cabinet and sustaining a deep and approximate 8 cm laceration to the right cheek in an irregular pattern to the angle of the right lower jaw. Wound does not proceed th   GERD (gastroesophageal reflux disease)    Herniated disc, lumbar    HFrEF (heart failure with reduced ejection fraction) (HCC)    History of 2019 novel coronavirus disease (COVID-19) 12/2019   History of bilateral cataract extraction    Hyperlipidemia    Incomplete left bundle branch block (LBBB)    Left wrist fracture    Lumbar stenosis    s/p LEFT L2-4 hemilaminectomies and partial facetectomies, foraminotomies for lateral recess and foraminal decompression of L3 and L4 nerve roots   Melanoma (HCC) 05/12/2020   Melanoma IS R post neck, excised  08/10/20   Mobitz type 1 second degree atrioventricular block    a.) evaluated by EP --> paroxysmal in nature; nocturnal epidoe felt to be vagally mediated   NICM (nonischemic cardiomyopathy) (HCC)    a. 04/2018 Echo: EF 45-50%. Anteroseptal and apical HK in some views. Mild AI/MR. Nl RV fxn; b. 05/2018 MV: mid-dist ant and antsept mild defect - ? breast atten. EF 41%. No ischemia; c. 03/2019 Echo: EF 30-35%, Gr2 DD, Trace  MR, triv TR. Asc Ao ectatic dil - 38mm.    Osteoporosis    Personal history of transient ischemic attack (TIA), and cerebral infarction without residual deficits 04/22/2003   Pneumonia due to COVID-19 virus 01/26/2020   SVT (supraventricular tachycardia) (HCC)    a.2008 s/p RFCA for AVNRT; b. 9 & 02/2019 Zio x 2: 1. Avg HR 89, 3 runs NSVT (longest 7 beats), Mobitz 1. 2. Avg HR 89, NSVT x 1 (5 beats), Mobitz 1. No signif arrhythmia.   Tendinitis of right knee 07/03/2017   Past Surgical History:  Procedure Laterality Date   APPENDECTOMY     BREAST CYST ASPIRATION Left 1977   CARDIAC CATHETERIZATION  2009   [facility] in [place] --[facility]   CARDIAC ELECTROPHYSIOLOGY STUDY AND ABLATION  2009   CATARACT EXTRACTION, BILATERAL  2010   COLONOSCOPY WITH PROPOFOL  N/A 03/21/2018   Procedure: COLONOSCOPY WITH Biopsy;  Surgeon: Selena Daily, MD;  Location: [facility];  Service: Endoscopy;  Laterality: N/A;   COLONOSCOPY WITH PROPOFOL  N/A 05/03/2021   Procedure: COLONOSCOPY WITH PROPOFOL ;  Surgeon: Selena Daily, MD;  Location: [facility] ENDOSCOPY;  Service: Gastroenterology;  Laterality: N/A;   CORONARY ANGIOPLASTY     DARRACH'S PROCEDURE Bilateral 1977   ESOPHAGOGASTRODUODENOSCOPY  2013   gastritis; done for complaint of dysphagia   ESOPHAGOGASTRODUODENOSCOPY (EGD) WITH PROPOFOL  N/A 03/21/2018   Procedure: ESOPHAGOGASTRODUODENOSCOPY (EGD) WITH Biopsies;  Surgeon: Selena Daily, MD;  Location: [facility];  Service: Endoscopy;  Laterality: N/A;  KEEP THIS PATIENT FIRST   JOINT REPLACEMENT     LEFT HEART CATH AND CORONARY ANGIOGRAPHY Left 05/02/2019   Procedure: LEFT HEART CATH AND CORONARY ANGIOGRAPHY;  Surgeon: Devorah Fonder, MD;  Location: [facility] INVASIVE CV LAB;  Service: Cardiovascular;  Laterality: Left;   LUMBAR LAMINECTOMY  2017   Procedure: LUMBAR LAMINECTOMY (L2-L4)   LYMPH NODE REMOVAL (NECK)     MOHS SURGERY  07/2016   BCCA nose    POLYPECTOMY N/A 03/21/2018   Procedure: POLYPECTOMY;  Surgeon: Selena Daily, MD;  Location: [facility];  Service: Endoscopy;  Laterality: N/A;   REPLACEMENT TOTAL KNEE Right 2013   TONSILLECTOMY     TOTAL HIP ARTHROPLASTY Right 10/09/2023   Procedure: ARTHROPLASTY, HIP, TOTAL, ANTERIOR APPROACH;  Surgeon: Venus Ginsberg, MD;  Location: [facility] ORS;  Service: Orthopedics;  Laterality: Right;   TOTAL SHOULDER REPLACEMENT Right 2012   Social History:  reports that she has never smoked. She has never used smokeless tobacco. She reports that she does not currently use alcohol. She reports that she does not use drugs.  Allergies  Allergen Reactions   Amoxicillin Hives, Shortness Of Breath and Swelling    Has tolerated a 1st generation cephalosporin (CEFAZOLIN) in the past without documented ADRs.    Aspirin Anaphylaxis and Shortness Of Breath   Bacitracin-Polymyx-Neo-Hc [Bacitra-Neomycin-Polymyxin-Hc] Rash   Ciprofloxacin Rash   Ciprofloxacin-Dexamethasone  Anaphylaxis   Crestor  [Rosuvastatin ] Other (See Comments)    Caused problems with memory.   Erythromycin Hives and Rash  Hydrochlorothiazide Other (See Comments) and Palpitations    Raises her blood pressure.    Ibuprofen Other (See Comments)    Sharp stomach pains   Morphine And Codeine Nausea And Vomiting   Morpholine Salicylate Nausea Only   Omeprazole Palpitations and Other (See Comments)    Tachycardia and same sensation as her SVTs per patient   Doxycycline Hyclate Hives   Keflex [Cephalexin] Hives    Has tolerated a 1st generation cephalosporin (CEFAZOLIN) in the past without documented ADRs.     Lovastatin Other (See Comments)    "Unable to function"   Simvastatin Other (See Comments)    "Unable to function"   Silicone Dermatitis and Hives   Neomycin-Bacitracin Zn-Polymyx Hives and Rash   Tape Hives and Rash   Family History  Problem Relation Age of Onset   Diabetes Mother    Stroke Mother    Valvular  heart disease Father    CAD Sister 16       died of MI   Stroke Sister    Stroke Sister 80   Clotting disorder Half-Sister    Clotting disorder Half-Sister    Breast cancer Neg Hx    Family history: Family history reviewed and not pertinent.  Prior to Admission medications   Medication Sig Start Date End Date Taking? Authorizing Provider  B Complex-C (B-COMPLEX WITH VITAMIN C) tablet Take 1 tablet by mouth daily.    Yes [provider]  baclofen  (LIORESAL ) 10 MG tablet Take 10 mg by mouth 3 (three) times daily.   Yes [provider]  calcium  carbonate (OS-CAL - DOSED IN MG OF ELEMENTAL CALCIUM ) 1250 (500 Ca) MG tablet Take 1 tablet by mouth daily with breakfast.   Yes [provider]  carvedilol  (COREG ) 6.25 MG tablet Take 1 tablet (6.25 mg total) by mouth 2 (two) times daily with a meal. 01/10/23  Yes Gollan, Deadra Everts, MD  cholecalciferol  (VITAMIN D ) 25 MCG (1000 UT) tablet Take 1,000 Units by mouth daily.   Yes [provider]  Cranberry 1000 MG CAPS Take 1,000 mg by mouth daily.    Yes [provider]  ezetimibe  (ZETIA ) 10 MG tablet Take 1 tablet (10 mg total) by mouth daily. 07/06/23  Yes Sheron Dixons, MD  fluticasone  (FLOVENT  HFA) 110 MCG/ACT inhaler Inhale 1 puff into the lungs 2 (two) times daily as needed.   Yes [provider]  gabapentin  (NEURONTIN ) 300 MG capsule Take 300-900 mg by mouth See admin instructions. Take 300 mg three times a day and 900 mg at bedtime   Yes [provider]  HYDROcodone -acetaminophen  (NORCO/VICODIN) 5-325 MG tablet Take 1 tablet by mouth every 8 (eight) hours as needed.   Yes [provider]  loratadine (CLARITIN) 10 MG tablet Take 10 mg by mouth daily as needed for allergies.   Yes [provider]  Multiple Vitamins-Minerals (MULTIVITAMIN WITH MINERALS) tablet Take 1 tablet by mouth daily. Centrum Silver   Yes [provider]  sacubitril-valsartan (ENTRESTO )  24-26 MG TAKE 1 TABLET BY MOUTH TWICE A DAY 02/24/23  Yes Gollan, Timothy J, MD  spironolactone  (ALDACTONE ) 25 MG tablet TAKE 1 TABLET (25 MG TOTAL) BY MOUTH DAILY. Patient taking differently: Take 12.5 mg by mouth daily. 09/22/23  Yes Boyce Byes, MD  tiZANidine (ZANAFLEX) 2 MG tablet Take 2 mg by mouth every 8 (eight) hours as needed for muscle spasms. 09/22/23 09/21/24 Yes [provider]  Turmeric 500 MG CAPS Take 500 mg by mouth  daily.    Yes [provider]  zinc  sulfate 220 (50 Zn) MG capsule Take 1 capsule (220 mg total) by mouth daily. 02/02/20  Yes Claretta Croft, MD   Physical Exam: Vitals:   10/11/23 1311 10/11/23 1454 10/12/23 0000 10/12/23 0743  BP: 117/68 106/66 110/64 104/60  Pulse: 96 95 96 91  Resp: 16 15 14 16   Temp: 98.5 F (36.9 C) 98.7 F (37.1 C) 98.6 F (37 C) 98.4 F (36.9 C)  TempSrc: Axillary Axillary  Oral  SpO2: 96% 93% 96% 93%  Weight:      Height:       NAD No neck pain/stiffness RRR Rales at bases left greater than right, normal wob no tachypnea Abdomen soft, non-tender Extremities warm, trace LE edema Bandage right anterior upper leg c/d/i  EKG: Read and pending completion  Chest x-ray on Admission: I personally reviewed and I agree with radiologist reading as below.  CT HEAD WO CONTRAST ( ) Result Date: 10/11/2023 CLINICAL DATA:  Confusion and meningitis suspected EXAM: CT HEAD WITHOUT CONTRAST TECHNIQUE: Contiguous axial images were obtained from the base of the skull through the vertex without intravenous contrast. RADIATION DOSE REDUCTION: This exam was performed according to the departmental dose-optimization program which includes automated exposure control, adjustment of the mA and/or kV according to patient size and/or use of iterative reconstruction technique. COMPARISON:  Brain MRI 06/25/2023 FINDINGS: Brain: No mass, hemorrhage or extra-axial collection. Left frontal encephalomalacia, unchanged. Old right cerebellar  small vessel infarcts. Vascular: No hyperdense vessel or unexpected vascular calcification. Skull: The visualized skull base, calvarium and extracranial soft tissues are normal. Sinuses/Orbits: No fluid levels or advanced mucosal thickening of the visualized paranasal sinuses. No mastoid or middle ear effusion. Normal orbits. Other: None. IMPRESSION: 1. No acute intracranial abnormality. 2. Left frontal encephalomalacia, unchanged. 3. Old right cerebellar small vessel infarcts. Electronically Signed   By: Juanetta Nordmann M.D.   On: 10/11/2023 21:39   DG Chest 2 View Result Date: 10/11/2023 CLINICAL DATA:  Fever. EXAM: CHEST - 2 VIEW COMPARISON:  Radiographs 03/03/2022 and 02/24/2022. Chest CT 01/28/2020. FINDINGS: Lower lung volumes with interval increased elevation of the right hemidiaphragm. Allowing for this, the heart size and mediastinal contours are stable. There is increased vascular congestion and bibasilar atelectasis. In addition, there is suspicion of a left perihilar infiltrate, with increased density projecting over the midthoracic spine on the lateral view. No evidence of pneumothorax or significant pleural effusion. Previous right shoulder reverse arthroplasty without acute osseous abnormality. IMPRESSION: 1. Lower lung volumes with increased vascular congestion and bibasilar atelectasis. 2. Suspected left perihilar infiltrate, suspicious for pneumonia. Followup PA and lateral chest X-ray is recommended in 4-6 weeks following appropriate therapy to ensure resolution and exclude underlying malignancy. Electronically Signed   By: Elmon Hagedorn M.D.   On: 10/11/2023 09:58   Labs on Admission: I have personally reviewed following labs  CBC: Recent Labs  Lab 10/10/23 0628 10/11/23 0626 10/11/23 1752 10/12/23 0655  WBC 14.3* 9.7 9.7 7.1  NEUTROABS  --   --  7.0  --   HGB 12.8 12.4 11.4* 11.2*  HCT 38.7 38.0 34.3* 33.2*  MCV 92.4 92.7 91.5 92.0  PLT 169 149* 164 155   Basic Metabolic  Panel: Recent Labs  Lab 10/10/23 0628 10/11/23 1752 10/12/23 0655  NA 140 139 138  K 4.3 3.9 4.2  CL 107 108 106  CO2 25 23 25   GLUCOSE 122* 107* 94  BUN 21 19 20   CREATININE  0.91 0.85 0.91  CALCIUM  9.3 8.5* 8.8*   GFR: Estimated Creatinine Clearance: 54.6 mL/min (by C-G formula based on SCr of 0.91 mg/dL).  Urine analysis:    Component Value Date/Time   COLORURINE AMBER (A) 10/03/2023 1401   APPEARANCEUR CLOUDY (A) 10/03/2023 1401   LABSPEC 1.019 10/03/2023 1401   PHURINE 7.0 10/03/2023 1401   GLUCOSEU 150 (A) 10/03/2023 1401   HGBUR NEGATIVE 10/03/2023 1401   BILIRUBINUR NEGATIVE 10/03/2023 1401   BILIRUBINUR neg 11/25/2019 1115   KETONESUR NEGATIVE 10/03/2023 1401   PROTEINUR NEGATIVE 10/03/2023 1401   UROBILINOGEN 0.2 11/25/2019 1115   NITRITE NEGATIVE 10/03/2023 1401   LEUKOCYTESUR TRACE (A) 10/03/2023 1401   Hines Ludwig, MD [facility]  If 7PM-7AM, please contact overnight-coverage provider If 7AM-7PM, please contact day attending provider [email or web address]  10/12/2023, 3:04 PM

## 2023-10-12 NOTE — Progress Notes (Addendum)
 Date of Admission:  10/09/2023    ID: Audrey Martinez is a 78 y.o. female  Principal Problem:   Headache Active Problems:   Mixed hyperlipidemia   Essential hypertension   Obesity (BMI 30-39.9)   Osteoarthritis of right hip joint due to dysplasia   CAD (coronary artery disease)   S/P total right hip arthroplasty   Fever    Subjective: Pt is doing much better No headache No fever Never had cough Medications:   baclofen   10 mg Oral TID   carvedilol   6.25 mg Oral BID WC   docusate sodium   100 mg Oral BID   enoxaparin  (LOVENOX ) injection  40 mg Subcutaneous Q24H   ezetimibe   10 mg Oral Daily   gabapentin   300 mg Oral TID WC   And   gabapentin   900 mg Oral QHS   polyethylene glycol  17 g Oral Daily   sacubitril-valsartan  1 tablet Oral BID   spironolactone   12.5 mg Oral Daily    Objective: Vital signs in last 24 hours: Patient Vitals for the past 24 hrs:  BP Temp Temp src Pulse Resp SpO2  10/12/23 0743 104/60 98.4 F (36.9 C) Oral 91 16 93 %  10/12/23 0000 110/64 98.6 F (37 C) -- 96 14 96 %  10/11/23 1454 106/66 98.7 F (37.1 C) Axillary 95 15 93 %       PHYSICAL EXAM:  General: Alert, cooperative, no distress, appears stated age. Not confused- answers questions appropriately Lungs: Clear to auscultation bilaterally. No Wheezing or Rhonchi. No rales. Heart: Regular rate and rhythm, no murmur, rub or gallop. Abdomen: Soft, non-tender,not distended. Bowel sounds normal. No masses Extremities:rt hip surgical site covered with honey comb-  Skin: No rashes or lesions. Or bruising Lymph: Cervical, supraclavicular normal. Neurologic: Grossly non-focal No clonus  Lab Results    Latest Ref Rng & Units 10/12/2023    6:55 AM 10/11/2023    5:52 PM 10/11/2023    6:26 AM  CBC  WBC 4.0 - 10.5 K/uL 7.1  9.7  9.7   Hemoglobin 12.0 - 15.0 g/dL 40.9  81.1  91.4   Hematocrit 36.0 - 46.0 % 33.2  34.3  38.0   Platelets 150 - 400 K/uL 155  164  149        Latest Ref  Rng & Units 10/12/2023    6:55 AM 10/11/2023    5:52 PM 10/10/2023    6:28 AM  CMP  Glucose 70 - 99 mg/dL 94  782  956   BUN 8 - 23 mg/dL 20  19  21    Creatinine 0.44 - 1.00 mg/dL 2.13  0.86  5.78   Sodium 135 - 145 mmol/L 138  139  140   Potassium 3.5 - 5.1 mmol/L 4.2  3.9  4.3   Chloride 98 - 111 mmol/L 106  108  107   CO2 22 - 32 mmol/L 25  23  25    Calcium  8.9 - 10.3 mg/dL 8.8  8.5  9.3   Total Protein 6.5 - 8.1 g/dL  5.2    Total Bilirubin 0.0 - 1.2 mg/dL  1.0    Alkaline Phos 38 - 126 U/L  37    AST 15 - 41 U/L  21    ALT 0 - 44 U/L  21        Microbiology: Blood culture from yesterday no growth so far Studies/Results: CT HEAD WO CONTRAST ( ) Result Date: 10/11/2023 CLINICAL DATA:  Confusion and  meningitis suspected EXAM: CT HEAD WITHOUT CONTRAST TECHNIQUE: Contiguous axial images were obtained from the base of the skull through the vertex without intravenous contrast. RADIATION DOSE REDUCTION: This exam was performed according to the departmental dose-optimization program which includes automated exposure control, adjustment of the mA and/or kV according to patient size and/or use of iterative reconstruction technique. COMPARISON:  Brain MRI 06/25/2023 FINDINGS: Brain: No mass, hemorrhage or extra-axial collection. Left frontal encephalomalacia, unchanged. Old right cerebellar small vessel infarcts. Vascular: No hyperdense vessel or unexpected vascular calcification. Skull: The visualized skull base, calvarium and extracranial soft tissues are normal. Sinuses/Orbits: No fluid levels or advanced mucosal thickening of the visualized paranasal sinuses. No mastoid or middle ear effusion. Normal orbits. Other: None. IMPRESSION: 1. No acute intracranial abnormality. 2. Left frontal encephalomalacia, unchanged. 3. Old right cerebellar small vessel infarcts. Electronically Signed   By: Juanetta Nordmann M.D.   On: 10/11/2023 21:39   DG Chest 2 View Result Date: 10/11/2023 CLINICAL DATA:  Fever.  EXAM: CHEST - 2 VIEW COMPARISON:  Radiographs 03/03/2022 and 02/24/2022. Chest CT 01/28/2020. FINDINGS: Lower lung volumes with interval increased elevation of the right hemidiaphragm. Allowing for this, the heart size and mediastinal contours are stable. There is increased vascular congestion and bibasilar atelectasis. In addition, there is suspicion of a left perihilar infiltrate, with increased density projecting over the midthoracic spine on the lateral view. No evidence of pneumothorax or significant pleural effusion. Previous right shoulder reverse arthroplasty without acute osseous abnormality. IMPRESSION: 1. Lower lung volumes with increased vascular congestion and bibasilar atelectasis. 2. Suspected left perihilar infiltrate, suspicious for pneumonia. Followup PA and lateral chest X-ray is recommended in 4-6 weeks following appropriate therapy to ensure resolution and exclude underlying malignancy. Electronically Signed   By: Elmon Hagedorn M.D.   On: 10/11/2023 09:58     Assessment/Plan:  Right hip arthroplasty on 10/09/2023 and 36 hours later has developed fever and confusion. Encephalopathy with clonus secondary to baclofen  and Norco.  Is completely resolved since the 2 medications have been stopped As per son patient could have early undiagnosed dementia The CT scan which was done yesterday shows encephalomalacia of the left frontal lobe No evidence of meningitis  Fever has resolved Not sure what the reason was possible postoperative fever could be atelectasis could be hematoma but the chest x-ray had shown kind of hazy infiltrate on the left side.  So pneumonia was questioned.  Could be fluid.  She is currently on ceftriaxone, azithromycin and vancomycin.  Can stop vancomycin Will do a procalcitonin today and if it is normal we may be able to stop the antibiotics  Right hip arthroplasty site looks fine  Some lumbar pain that could be secondary to spinal anesthesia  Multiple drug  allergies including antibiotics.  Listed.  Amoxicillin, Keflex, ciprofloxacin and doxycycline.  Discussed the management with the patient and the nurses.

## 2023-10-12 NOTE — Consult Note (Signed)
 Pharmacy Antibiotic Note  ASSESSMENT: 78 y.o. female with PMH including Alzheimers, hx SDH is presenting with sepsis, headache, and concerns during an encounter for right THA. Concern for pneumonia, with CXR showing left perihilar infiltrate, suspicious for pneumonia. Pharmacy has been consulted to manage vancomycin dosing.  PLAN: Change vancomycin to 1250mg  IV q24h  eAUC 5508 (goal AUC 400-600), Cmin 13.5 Scr 0.91, IBW, Vd 0.72 MRSA PCR neg, follow blood cx and ID recommendations for stopping/adjusting antibiotic.   Monitor renal function to assess for any necessary antibiotic dosing changes.   Patient measurements: Height: 5\' 2"  (157.5 cm) Weight: 86.5 kg (190 lb 11.2 oz) IBW/kg (Calculated) : 50.1  Vital signs: Temp: 98.4 F (36.9 C) (05/15 0743) Temp Source: Oral (05/15 0743) BP: 104/60 (05/15 0743) Pulse Rate: 91 (05/15 0743) Recent Labs  Lab 10/10/23 0628 10/11/23 0626 10/11/23 1752 10/12/23 0655  WBC 14.3* 9.7 9.7 7.1  CREATININE 0.91  --  0.85 0.91   Estimated Creatinine Clearance: 54.6 mL/min (by C-G formula based on SCr of 0.91 mg/dL).  Allergies: Allergies  Allergen Reactions   Amoxicillin Hives, Shortness Of Breath and Swelling    Has tolerated a 1st generation cephalosporin (CEFAZOLIN) in the past without documented ADRs.    Aspirin Anaphylaxis and Shortness Of Breath   Bacitracin-Polymyx-Neo-Hc [Bacitra-Neomycin-Polymyxin-Hc] Rash   Ciprofloxacin Rash   Ciprofloxacin-Dexamethasone  Anaphylaxis   Crestor  [Rosuvastatin ] Other (See Comments)    Caused problems with memory.   Erythromycin Hives and Rash   Hydrochlorothiazide Other (See Comments) and Palpitations    Raises her blood pressure.    Ibuprofen Other (See Comments)    Sharp stomach pains   Morphine And Codeine Nausea And Vomiting   Morpholine Salicylate Nausea Only   Omeprazole Palpitations and Other (See Comments)    Tachycardia and same sensation as her SVTs per patient   Doxycycline Hyclate  Hives   Keflex [Cephalexin] Hives    Has tolerated a 1st generation cephalosporin (CEFAZOLIN) in the past without documented ADRs.     Lovastatin Other (See Comments)    "Unable to function"   Simvastatin Other (See Comments)    "Unable to function"   Silicone Dermatitis and Hives   Neomycin-Bacitracin Zn-Polymyx Hives and Rash   Tape Hives and Rash    Antimicrobials this admission: Vancomycin 5/14 >> Cefepime 5/14 x 1 Azithromycin 5/14 >> Ceftriaxone 5/14 >>  Dose adjustments this admission: N/A  Microbiology results: 5/14 MRSA PCR: ordered   Thank you for allowing pharmacy to be a part of this patient's care.  Ivyanna Sibert, PharmD, BCPS, BCIDP Work Cell: [phone] 10/12/2023 12:55 PM

## 2023-10-13 ENCOUNTER — Other Ambulatory Visit: Payer: Self-pay | Admitting: Student

## 2023-10-13 DIAGNOSIS — J189 Pneumonia, unspecified organism: Secondary | ICD-10-CM | POA: Diagnosis not present

## 2023-10-13 DIAGNOSIS — R2681 Unsteadiness on feet: Secondary | ICD-10-CM | POA: Diagnosis not present

## 2023-10-13 DIAGNOSIS — N1831 Chronic kidney disease, stage 3a: Secondary | ICD-10-CM | POA: Diagnosis not present

## 2023-10-13 DIAGNOSIS — I10 Essential (primary) hypertension: Secondary | ICD-10-CM | POA: Diagnosis not present

## 2023-10-13 DIAGNOSIS — I5020 Unspecified systolic (congestive) heart failure: Secondary | ICD-10-CM | POA: Diagnosis not present

## 2023-10-13 DIAGNOSIS — N1832 Chronic kidney disease, stage 3b: Secondary | ICD-10-CM | POA: Diagnosis not present

## 2023-10-13 DIAGNOSIS — I130 Hypertensive heart and chronic kidney disease with heart failure and stage 1 through stage 4 chronic kidney disease, or unspecified chronic kidney disease: Secondary | ICD-10-CM | POA: Diagnosis not present

## 2023-10-13 DIAGNOSIS — R278 Other lack of coordination: Secondary | ICD-10-CM | POA: Diagnosis not present

## 2023-10-13 DIAGNOSIS — R4189 Other symptoms and signs involving cognitive functions and awareness: Secondary | ICD-10-CM | POA: Diagnosis not present

## 2023-10-13 DIAGNOSIS — G9340 Encephalopathy, unspecified: Secondary | ICD-10-CM | POA: Diagnosis not present

## 2023-10-13 DIAGNOSIS — Z471 Aftercare following joint replacement surgery: Secondary | ICD-10-CM | POA: Diagnosis not present

## 2023-10-13 DIAGNOSIS — J4520 Mild intermittent asthma, uncomplicated: Secondary | ICD-10-CM | POA: Diagnosis not present

## 2023-10-13 DIAGNOSIS — I959 Hypotension, unspecified: Secondary | ICD-10-CM | POA: Diagnosis not present

## 2023-10-13 DIAGNOSIS — Z743 Need for continuous supervision: Secondary | ICD-10-CM | POA: Diagnosis not present

## 2023-10-13 DIAGNOSIS — I420 Dilated cardiomyopathy: Secondary | ICD-10-CM | POA: Diagnosis not present

## 2023-10-13 DIAGNOSIS — K5900 Constipation, unspecified: Secondary | ICD-10-CM | POA: Diagnosis not present

## 2023-10-13 DIAGNOSIS — I2510 Atherosclerotic heart disease of native coronary artery without angina pectoris: Secondary | ICD-10-CM | POA: Diagnosis not present

## 2023-10-13 DIAGNOSIS — Z741 Need for assistance with personal care: Secondary | ICD-10-CM | POA: Diagnosis not present

## 2023-10-13 DIAGNOSIS — M810 Age-related osteoporosis without current pathological fracture: Secondary | ICD-10-CM | POA: Diagnosis not present

## 2023-10-13 DIAGNOSIS — Z96641 Presence of right artificial hip joint: Secondary | ICD-10-CM | POA: Diagnosis not present

## 2023-10-13 DIAGNOSIS — I471 Supraventricular tachycardia, unspecified: Secondary | ICD-10-CM | POA: Diagnosis not present

## 2023-10-13 DIAGNOSIS — M6281 Muscle weakness (generalized): Secondary | ICD-10-CM | POA: Diagnosis not present

## 2023-10-13 DIAGNOSIS — R918 Other nonspecific abnormal finding of lung field: Secondary | ICD-10-CM | POA: Diagnosis not present

## 2023-10-13 DIAGNOSIS — E782 Mixed hyperlipidemia: Secondary | ICD-10-CM | POA: Diagnosis not present

## 2023-10-13 DIAGNOSIS — G629 Polyneuropathy, unspecified: Secondary | ICD-10-CM | POA: Diagnosis not present

## 2023-10-13 LAB — RESPIRATORY PANEL BY PCR

## 2023-10-13 LAB — PROCALCITONIN: Procalcitonin: <0.1 ng/mL

## 2023-10-13 MED ORDER — AZITHROMYCIN 500 MG PO TABS: 500.0000 mg | ORAL_TABLET | Freq: Every day | ORAL | Status: DC

## 2023-10-13 MED ORDER — CEFUROXIME AXETIL 500 MG PO TABS
500.0000 mg | ORAL_TABLET | Freq: Two times a day (BID) | ORAL | Status: DC
Start: 2023-10-13 — End: 2023-10-13

## 2023-10-13 MED ORDER — GABAPENTIN 300 MG PO CAPS
ORAL_CAPSULE | ORAL | Status: AC
  Filled 2023-10-13: qty 1

## 2023-10-13 MED ORDER — BISACODYL 10 MG RE SUPP
10.0000 mg | Freq: Once | RECTAL | Status: DC
  Filled 2023-10-13: qty 1

## 2023-10-13 MED ORDER — TRAMADOL HCL 50 MG PO TABS
50.0000 mg | ORAL_TABLET | Freq: Four times a day (QID) | ORAL | 0 refills | Status: DC | PRN
Start: 2023-10-13 — End: 2023-10-13

## 2023-10-13 MED ORDER — LACTULOSE 10 GM/15ML PO SOLN
20.0000 g | Freq: Once | ORAL | Status: DC
  Filled 2023-10-13: qty 30

## 2023-10-13 MED ORDER — TRAMADOL HCL 50 MG PO TABS
ORAL_TABLET | ORAL | Status: AC
  Filled 2023-10-13: qty 1

## 2023-10-13 MED ORDER — TRAMADOL HCL 50 MG PO TABS: 50.0000 mg | ORAL_TABLET | Freq: Four times a day (QID) | ORAL | 0 refills | Status: DC | PRN

## 2023-10-13 MED ORDER — EZETIMIBE 10 MG PO TABS
ORAL_TABLET | ORAL | Status: AC
  Filled 2023-10-13: qty 1

## 2023-10-13 NOTE — Progress Notes (Signed)
 Spoke with Jonel Nephew, charge nurse at [facility], facility patient will discharge to.  Discharge instruction and printed prescriptions sent in envelop, with [employer] transportation

## 2023-10-13 NOTE — Progress Notes (Addendum)
 Physical Therapy Treatment Patient Details Name: Audrey Martinez MRN: [id number] DOB: Feb 14, 1946 Today's Date: 10/13/2023   History of Present Illness Pt is a 78 yo female diagnosed with right hip OA and is s/p elective R THA.  PMH includes: SDH, asthma, SVT, R TKA, HTN, CAD, and A-fib.    PT Comments  Pt was long sitting in bed upon arrival. A and O x 2. Pleasantly confused but agreeable to session. Pt was unaware she had hip surgery however is able to consistently follow commands with increased time + assistance. Pt is still far from her baseline abilities. She will continue to benefit form skilled PT at DC to maximize her independence and safety with all ADLs.    If plan is discharge home, recommend the following: A lot of help with walking and/or transfers;A lot of help with bathing/dressing/bathroom;Assistance with cooking/housework;Assistance with feeding;Direct supervision/assist for medications management;Direct supervision/assist for financial management;Assist for transportation;Help with stairs or ramp for entrance;Supervision due to cognitive status     Equipment Recommendations  Rolling walker (2 wheels);BSC/3in1       Precautions / Restrictions Precautions Precautions: Fall;Anterior Hip Precaution Booklet Issued: Yes (comment) Recall of Precautions/Restrictions: Impaired Restrictions Weight Bearing Restrictions Per Provider Order: Yes RLE Weight Bearing Per Provider Order: Weight bearing as tolerated     Mobility  Bed Mobility Overal bed mobility: Needs Assistance Bed Mobility: Supine to Sit  Supine to sit: Min assist, Mod assist  General bed mobility comments: slightly less assistance today however still not at her baseline abilities.    Transfers Overall transfer level: Needs assistance Equipment used: Rolling walker (2 wheels) Transfers: Sit to/from Stand Sit to Stand: Min assist  General transfer comment: less assist today to stand however stillrequires min  assist + moderate vcing for sequencing and technique.    Ambulation/Gait Ambulation/Gait assistance: Contact guard assist Assistive device: Rolling walker (2 wheels) Gait Pattern/deviations: Step-to pattern, Trunk flexed Gait velocity: decreased  General Gait Details: Pt was able to tolerate ambulation ~ 15 ft with RW. distance limited by pain but overall tolerates session well.   Balance Overall balance assessment: Needs assistance Sitting-balance support: No upper extremity supported, Feet supported Sitting balance-Leahy Scale: Good     Standing balance support: Bilateral upper extremity supported, During functional activity, Reliant on assistive device for balance Standing balance-Leahy Scale: Fair Standing balance comment: dependent on RW for BUE support       Communication Communication Communication: No apparent difficulties  Cognition Arousal: Alert Behavior During Therapy: WFL for tasks assessed/performed   PT - Cognitive impairments: History of cognitive impairments, No family/caregiver present to determine baseline  PT - Cognition Comments: Pt is alert and cooperative but pleasantly confused. Oriented x 2 only. Unaware why she was in the hospital. Following commands: Intact      Cueing Cueing Techniques: Verbal cues, Tactile cues         Pertinent Vitals/Pain Pain Assessment Pain Location: R hip Pain Descriptors / Indicators: Sore     PT Goals (current goals can now be found in the care plan section) Acute Rehab PT Goals Patient Stated Goal: get well so I can go home to be with my dogs Progress towards PT goals: Progressing toward goals    Frequency    BID       AM-PAC PT "6 Clicks" Mobility   Outcome Measure  Help needed turning from your back to your side while in a flat bed without using bedrails?: A Little Help needed moving from lying on  your back to sitting on the side of a flat bed without using bedrails?: A Lot Help needed moving to and  from a bed to a chair (including a wheelchair)?: A Lot Help needed standing up from a chair using your arms (e.g., wheelchair or bedside chair)?: A Little Help needed to walk in hospital room?: A Little Help needed climbing 3-5 steps with a railing? : A Lot 6 Click Score: 15    End of Session   Activity Tolerance: Patient tolerated treatment well;Patient limited by pain;Patient limited by fatigue Patient left: in bed;with call bell/phone within reach;with bed alarm set;with nursing/sitter in room Nurse Communication: Mobility status;Weight bearing status PT Visit Diagnosis: Other abnormalities of gait and mobility (R26.89);Muscle weakness (generalized) (M62.81);Pain Pain - Right/Left: Right Pain - part of body: Hip     Time: 6644-0347 PT Time Calculation (min) (ACUTE ONLY): 12 min  Charges:    $Gait Training: 8-22 mins PT General Charges $$ ACUTE PT VISIT: 1 Visit                     Chester Costa PTA 10/13/23, 9:19 AM

## 2023-10-13 NOTE — Progress Notes (Signed)
 Date of Admission:  10/09/2023    ID: Audrey Martinez is a 78 y.o. female  Principal Problem:   Headache Active Problems:   Mixed hyperlipidemia   Essential hypertension   Atrial flutter (HCC)   Obesity (BMI 30-39.9)   HFrEF (heart failure with reduced ejection fraction) (HCC)   Osteoarthritis of right hip joint due to dysplasia   Personal history of PE (pulmonary embolism)   CAD (coronary artery disease)   Chronic kidney disease, stage 3a (HCC)   S/P total right hip arthroplasty   Fever    Subjective: Pt is doing much better No fever Never had a cough Medications:   baclofen   10 mg Oral TID   bisacodyl  10 mg Rectal Once   carvedilol   6.25 mg Oral BID WC   docusate sodium   100 mg Oral BID   enoxaparin  (LOVENOX ) injection  40 mg Subcutaneous Q24H   ezetimibe   10 mg Oral Daily   gabapentin   300 mg Oral TID WC   And   gabapentin   900 mg Oral QHS   lactulose  20 g Oral Once   polyethylene glycol  17 g Oral Daily   sacubitril-valsartan  1 tablet Oral BID   spironolactone   12.5 mg Oral Daily    Objective: Vital signs in last 24 hours: Patient Vitals for the past 24 hrs:  BP Temp Temp src Pulse Resp SpO2  10/13/23 1034 109/63 -- -- 86 14 94 %  10/13/23 0800 106/65 (!) 97.4 F (36.3 C) -- 91 15 96 %  10/13/23 0600 101/64 98.2 F (36.8 C) Oral 97 16 94 %  10/13/23 0200 111/69 98.9 F (37.2 C) Oral 96 15 95 %  10/12/23 2113 (!) 104/57 99.2 F (37.3 C) Tympanic 95 16 98 %  10/12/23 1532 (!) 101/58 99 F (37.2 C) Oral 78 15 98 %       PHYSICAL EXAM:  General:awake and alert-  Lungs: b/l air entry Heart: Regular rate and rhythm, no murmur, rub or gallop. Abdomen: Soft, non-tender,not distended. Bowel sounds normal. No masses Extremities:rt hip surgical site covered with honey comb-  Skin: No rashes or lesions. Or bruising Lymph: Cervical, supraclavicular normal. Neurologic: Grossly non-focal No clonus  Lab Results    Latest Ref Rng & Units 10/12/2023     6:55 AM 10/11/2023    5:52 PM 10/11/2023    6:26 AM  CBC  WBC 4.0 - 10.5 K/uL 7.1  9.7  9.7   Hemoglobin 12.0 - 15.0 g/dL 16.1  09.6  04.5   Hematocrit 36.0 - 46.0 % 33.2  34.3  38.0   Platelets 150 - 400 K/uL 155  164  149        Latest Ref Rng & Units 10/12/2023    6:55 AM 10/11/2023    5:52 PM 10/10/2023    6:28 AM  CMP  Glucose 70 - 99 mg/dL 94  409  811   BUN 8 - 23 mg/dL 20  19  21    Creatinine 0.44 - 1.00 mg/dL 9.14  7.82  9.56   Sodium 135 - 145 mmol/L 138  139  140   Potassium 3.5 - 5.1 mmol/L 4.2  3.9  4.3   Chloride 98 - 111 mmol/L 106  108  107   CO2 22 - 32 mmol/L 25  23  25    Calcium  8.9 - 10.3 mg/dL 8.8  8.5  9.3   Total Protein 6.5 - 8.1 g/dL  5.2  Total Bilirubin 0.0 - 1.2 mg/dL  1.0    Alkaline Phos 38 - 126 U/L  37    AST 15 - 41 U/L  21    ALT 0 - 44 U/L  21        Microbiology: Blood culture NG Studies/Results: CT HEAD WO CONTRAST ( ) Result Date: 10/11/2023 CLINICAL DATA:  Confusion and meningitis suspected EXAM: CT HEAD WITHOUT CONTRAST TECHNIQUE: Contiguous axial images were obtained from the base of the skull through the vertex without intravenous contrast. RADIATION DOSE REDUCTION: This exam was performed according to the departmental dose-optimization program which includes automated exposure control, adjustment of the mA and/or kV according to patient size and/or use of iterative reconstruction technique. COMPARISON:  Brain MRI 06/25/2023 FINDINGS: Brain: No mass, hemorrhage or extra-axial collection. Left frontal encephalomalacia, unchanged. Old right cerebellar small vessel infarcts. Vascular: No hyperdense vessel or unexpected vascular calcification. Skull: The visualized skull base, calvarium and extracranial soft tissues are normal. Sinuses/Orbits: No fluid levels or advanced mucosal thickening of the visualized paranasal sinuses. No mastoid or middle ear effusion. Normal orbits. Other: None. IMPRESSION: 1. No acute intracranial abnormality. 2.  Left frontal encephalomalacia, unchanged. 3. Old right cerebellar small vessel infarcts. Electronically Signed   By: Juanetta Nordmann M.D.   On: 10/11/2023 21:39     Assessment/Plan:  Right hip arthroplasty on 10/09/2023 and 36 hours later has developed fever and confusion. Encephalopathy with clonus secondary to baclofen  and Norco.  Is completely resolved since the 2 medications have been stopped As per son patient could have early undiagnosed dementia The CT scan which was done yesterday shows encephalomalacia of the left frontal lobe No evidence of meningitis  Fever has resolved Not sure what the reason was possible postoperative fever could be atelectasis could be hematoma but the chest x-ray had shown kind of hazy infiltrate on the left side.  So pneumonia was questioned.  Could be fluid.  She is currently on ceftriaxone, azithromycin  Procal was 0.11 and repeat today < 0.10 So no need to give any more antibiotics  as bacterial pneumonia unlikely  Right hip arthroplasty site looks fine  Some lumbar pain that could be secondary to spinal anesthesia  Multiple drug allergies including antibiotics.  Listed.  Amoxicillin, Keflex, ciprofloxacin and doxycycline.  Discussed the management with the patient and ortho team Pt is being discharged

## 2023-10-13 NOTE — Plan of Care (Incomplete)
   Problem: Pain Management: Goal: Pain level will decrease with appropriate interventions Outcome: Progressing

## 2023-10-13 NOTE — Progress Notes (Signed)
 Subjective: 4 Days Post-Op Procedure(s) (LRB): ARTHROPLASTY, HIP, TOTAL, ANTERIOR APPROACH (Right) Patient reports pain as mild.   Patient is well, and has had no acute complaints or problems Denies any CP, SOB, ABD pain. No cough We will continue therapy today.  Plan is to go Skilled nursing facility after hospital stay.  Objective: Vital signs in last 24 hours: Temp:  [98.2 F (36.8 C)-99.2 F (37.3 C)] 98.2 F (36.8 C) (05/16 0600) Pulse Rate:  [78-97] 91 (05/16 0800) Resp:  [15-16] 15 (05/16 0800) BP: (101-111)/(57-69) 106/65 (05/16 0800) SpO2:  [94 %-98 %] 96 % (05/16 0800)  Intake/Output from previous day: 05/15 0701 - 05/16 0700 In: 486.7 [P.O.:120; IV Piggyback:366.7] Out: 1020 [Urine:1020] Intake/Output this shift: No intake/output data recorded.  Recent Labs    10/11/23 0626 10/11/23 1752 10/12/23 0655  HGB 12.4 11.4* 11.2*   Recent Labs    10/11/23 1752 10/12/23 0655  WBC 9.7 7.1  RBC 3.75* 3.61*  HCT 34.3* 33.2*  PLT 164 155   Recent Labs    10/11/23 1752 10/12/23 0655  NA 139 138  K 3.9 4.2  CL 108 106  CO2 23 25  BUN 19 20  CREATININE 0.85 0.91  GLUCOSE 107* 94  CALCIUM  8.5* 8.8*   No results for input(s): "LABPT", "INR" in the last 72 hours.  EXAM General - Patient is Alert, Appropriate, and Oriented Extremity - Neurovascular intact Sensation intact distally Intact pulses distally Dorsiflexion/Plantar flexion intact Dressing - dressing C/D/I and no drainage Motor Function - intact, moving foot and toes well on exam.   Past Medical History:  Diagnosis Date   Actinic keratosis    Acute renal failure (ARF) (HCC) 01/31/2020   Acute respiratory failure with hypoxia (HCC) 01/29/2020   Amaurosis fugax, right eye 12/2018   Aortic atherosclerosis (HCC)    Arthritis    Asthma    Atrial tachycardia (HCC)    a.) s/p RFCA for AVNRT in 2008 at [facility] [place]    Basal cell carcinoma 07/2016   central forehead (tx  in [place] )   Bilateral pulmonary embolism (HCC) 01/28/2020   a.) in setting of SARS-CoV-2 infection (12/2019); noted BILATERALLY with (+) associated RIGHT heart strain; Tx'd with rivaroxaban  Xarelto  20 mg daily x 6 months then PRN   Cerebellar infarct (RIGHT) 1998   Coronary artery disease    DDD (degenerative disc disease), cervical    Dyspnea    Facial laceration 06/16/2022   Date of injury: 06/08/2022  [facility] ED history below:  78 year old female presenting to the emergency department after mechanical, nonsyncopal fall causing her to strike her face on the edge/corner of a curio cabinet and sustaining a deep and approximate 8 cm laceration to the right cheek in an irregular pattern to the angle of the right lower jaw. Wound does not proceed th   GERD (gastroesophageal reflux disease)    Herniated disc, lumbar    HFrEF (heart failure with reduced ejection fraction) (HCC)    History of 2019 novel coronavirus disease (COVID-19) 12/2019   History of bilateral cataract extraction    Hyperlipidemia    Incomplete left bundle branch block (LBBB)    Left wrist fracture    Lumbar stenosis    s/p LEFT L2-4 hemilaminectomies and partial facetectomies, foraminotomies for lateral recess and foraminal decompression of L3 and L4 nerve roots   Melanoma (HCC) 05/12/2020   Melanoma IS R post neck, excised 08/10/20   Mobitz type 1 second degree atrioventricular  block    a.) evaluated by EP --> paroxysmal in nature; nocturnal epidoe felt to be vagally mediated   NICM (nonischemic cardiomyopathy) (HCC)    a. 04/2018 Echo: EF 45-50%. Anteroseptal and apical HK in some views. Mild AI/MR. Nl RV fxn; b. 05/2018 MV: mid-dist ant and antsept mild defect - ? breast atten. EF 41%. No ischemia; c. 03/2019 Echo: EF 30-35%, Gr2 DD, Trace MR, triv TR. Asc Ao ectatic dil - 38mm.    Osteoporosis    Personal history of transient ischemic attack (TIA), and cerebral infarction without residual  deficits 04/22/2003   Pneumonia due to COVID-19 virus 01/26/2020   SVT (supraventricular tachycardia) (HCC)    a.2008 s/p RFCA for AVNRT; b. 9 & 02/2019 Zio x 2: 1. Avg HR 89, 3 runs NSVT (longest 7 beats), Mobitz 1. 2. Avg HR 89, NSVT x 1 (5 beats), Mobitz 1. No signif arrhythmia.   Tendinitis of right knee 07/03/2017    Assessment/Plan:   4 Days Post-Op Procedure(s) (LRB): ARTHROPLASTY, HIP, TOTAL, ANTERIOR APPROACH (Right) Principal Problem:   Headache Active Problems:   Mixed hyperlipidemia   Essential hypertension   Atrial flutter (HCC)   Obesity (BMI 30-39.9)   HFrEF (heart failure with reduced ejection fraction) (HCC)   Osteoarthritis of right hip joint due to dysplasia   Personal history of PE (pulmonary embolism)   CAD (coronary artery disease)   Chronic kidney disease, stage 3a (HCC)   S/P total right hip arthroplasty   Fever  Estimated body mass index is 34.88 kg/m as calculated from the following:   Height as of this encounter: 5\' 2"  (1.575 m).   Weight as of this encounter: 86.5 kg. Advance diet Up with therapy Pain well controlled Labs are stable Vital signs are stable, afebrile Altered mental status -patient back to baseline.  Continue to avoid sedating medications Pneumonia -continue with antibiotics per ID. Procacalcitonin normal so we may consider dc abx today Care management to assist with discharge to skilled nursing facility  DVT Prophylaxis - Lovenox , TED hose, and SCDs Weight-Bearing as tolerated to right leg   T. Thomos Flies, PA-C [facility] 10/13/2023, 8:02 AM

## 2023-10-13 NOTE — Discharge Summary (Addendum)
 Physician Discharge Summary  Patient ID: Audrey Martinez MRN: [id number] DOB/AGE: [birth year] 78 y.o.  Admit date: 10/09/2023 Discharge date: 10/13/2023  Admission Diagnoses:  Primary osteoarthritis of right hip [M16.11] S/P total right hip arthroplasty [Z96.641]   Discharge Diagnoses: Patient Active Problem List   Diagnosis Date Noted   Headache 10/11/2023   Fever 10/11/2023   S/P total right hip arthroplasty 10/09/2023   Problem related to living alone 08/08/2023   TIA (transient ischemic attack) 06/25/2023   CAD (coronary artery disease) 06/25/2023   Chronic kidney disease, stage 3a (HCC) 06/25/2023   Age-related cognitive decline 06/15/2023   Encounter for long-term (current) use of non-steroidal anti-inflammatories 06/15/2023   Personal history of PE (pulmonary embolism) 06/15/2023   Cause of injury, accidental fall, subsequent encounter 06/15/2023   Problem related to health literacy 06/15/2023   Personal history of fall 06/15/2023   Osteoarthritis of right hip joint due to dysplasia 03/14/2023   Constipation 12/16/2022   HFrEF (heart failure with reduced ejection fraction) (HCC) 06/24/2022   Melanoma in situ of neck (HCC) 06/22/2021   Trochanteric bursitis of right hip 05/18/2021   Hx of colonic polyps    Atrial flutter (HCC) 01/31/2020   Obesity (BMI 30-39.9) 01/31/2020   Left lower lobe pulmonary nodule 01/29/2020   Dilated cardiomyopathy (HCC) 05/02/2019   Amaurosis fugax of right eye 01/13/2019   Lumbar radiculopathy 05/18/2017   Lumbar degenerative disc disease 05/18/2017   Spinal stenosis of lumbar region without neurogenic claudication 05/18/2017   Atrial tachycardia (HCC) 04/14/2017   SVT (supraventricular tachycardia) (HCC) 04/14/2017   Essential hypertension 04/14/2017   Intermittent asthma without complication 04/14/2017   Osteoporosis 03/21/2017   Benign essential tremor 03/21/2017   Hx of atrioventricular node ablation 02/01/2008   Mixed  hyperlipidemia 05/12/2003    Past Medical History:  Diagnosis Date   Actinic keratosis    Acute renal failure (ARF) (HCC) 01/31/2020   Acute respiratory failure with hypoxia (HCC) 01/29/2020   Amaurosis fugax, right eye 12/2018   Aortic atherosclerosis (HCC)    Arthritis    Asthma    Atrial tachycardia (HCC)    a.) s/p RFCA for AVNRT in 2008 at [facility] [place]    Basal cell carcinoma 07/2016   central forehead (tx in [place] )   Bilateral pulmonary embolism (HCC) 01/28/2020   a.) in setting of SARS-CoV-2 infection (12/2019); noted BILATERALLY with (+) associated RIGHT heart strain; Tx'd with rivaroxaban  Xarelto  20 mg daily x 6 months then PRN   Cerebellar infarct (RIGHT) 1998   Coronary artery disease    DDD (degenerative disc disease), cervical    Dyspnea    Facial laceration 06/16/2022   Date of injury: 06/08/2022  [facility] ED history below:  78 year old female presenting to the emergency department after mechanical, nonsyncopal fall causing her to strike her face on the edge/corner of a curio cabinet and sustaining a deep and approximate 8 cm laceration to the right cheek in an irregular pattern to the angle of the right lower jaw. Wound does not proceed th   GERD (gastroesophageal reflux disease)    Herniated disc, lumbar    HFrEF (heart failure with reduced ejection fraction) (HCC)    History of 2019 novel coronavirus disease (COVID-19) 12/2019   History of bilateral cataract extraction    Hyperlipidemia    Incomplete left bundle branch block (LBBB)    Left wrist fracture    Lumbar stenosis    s/p LEFT L2-4 hemilaminectomies and partial facetectomies, foraminotomies  for lateral recess and foraminal decompression of L3 and L4 nerve roots   Melanoma (HCC) 05/12/2020   Melanoma IS R post neck, excised 08/10/20   Mobitz type 1 second degree atrioventricular block    a.) evaluated by EP --> paroxysmal in nature; nocturnal epidoe felt  to be vagally mediated   NICM (nonischemic cardiomyopathy) (HCC)    a. 04/2018 Echo: EF 45-50%. Anteroseptal and apical HK in some views. Mild AI/MR. Nl RV fxn; b. 05/2018 MV: mid-dist ant and antsept mild defect - ? breast atten. EF 41%. No ischemia; c. 03/2019 Echo: EF 30-35%, Gr2 DD, Trace MR, triv TR. Asc Ao ectatic dil - 38mm.    Osteoporosis    Personal history of transient ischemic attack (TIA), and cerebral infarction without residual deficits 04/22/2003   Pneumonia due to COVID-19 virus 01/26/2020   SVT (supraventricular tachycardia) (HCC)    a.2008 s/p RFCA for AVNRT; b. 9 & 02/2019 Zio x 2: 1. Avg HR 89, 3 runs NSVT (longest 7 beats), Mobitz 1. 2. Avg HR 89, NSVT x 1 (5 beats), Mobitz 1. No signif arrhythmia.   Tendinitis of right knee 07/03/2017     Transfusion: none   Consultants (if any): Treatment Team:  Alica Inks, MD Janeane Mealy, MD Montey Apa, DO  Discharged Condition: Improved  Hospital Course: Audrey Martinez is an 78 y.o. female who was admitted 10/09/2023 with a diagnosis of Headache and went to the operating room on 10/09/2023 and underwent the above named procedures.    Surgeries: Procedure(s): ARTHROPLASTY, HIP, TOTAL, ANTERIOR APPROACH on 10/09/2023 Patient tolerated the surgery well. Taken to PACU where she was stabilized and then transferred to the orthopedic floor.  Started on Lovenox  40 mg q 24 hrs. TEDs and SCDs applied bilaterally. Heels elevated on bed. No evidence of DVT. Negative Homan. Physical therapy started on day #1 for gait training and transfer. OT started day #1 for ADL and assisted devices.  On post op day 1 patient with a fever of 101 and altered mental status. Chest xray showed right infiltrate concerning for pneumonia. Patient started on IV abx. Norco discontinued. Howard Macho remained well controlled. POD 2 mental status back to baseline, and afebrile. POD 4 IV abx discontinued. Procalcitonin levels were <0.10 indicating  no bacterial infection. Antibiotics were discontinued. VSS, pain well controlled. Slow progress with PT. On post op day #4 patient was stable and ready for discharge to SNF.  Implants:  Cup: Trident Tritanium Clusterhole 50/D w/x2 screws    Liner: Neutral X3 poly 36/D  Stem: Insignia #5 Std offset  Head:Biolox ceramic 36 -2.53mm   She was given perioperative antibiotics:  Anti-infectives (From admission, onward)    Start     Dose/Rate Route Frequency Ordered Stop   10/13/23 0000  cefUROXime (CEFTIN) 500 MG tablet  Status:  Discontinued        500 mg Oral 2 times daily with meals 10/13/23 0849 10/13/23    10/13/23 0000  azithromycin (ZITHROMAX) 500 MG tablet  Status:  Discontinued        500 mg Oral Daily 10/13/23 0849 10/13/23    10/12/23 2200  Vancomycin (VANCOCIN) 1,250 mg in sodium chloride  0.9 % 250 mL IVPB  Status:  Discontinued        1,250 mg 166.7 mL/hr over 90 Minutes Intravenous Every 24 hours 10/12/23 1255 10/12/23 1258   10/12/23 2200  vancomycin (VANCOREADY) IVPB 1250 mg/250 mL  Status:  Discontinued        1,250  mg 166.7 mL/hr over 90 Minutes Intravenous Every 24 hours 10/12/23 1258 10/12/23 1405   10/12/23 2000  vancomycin (VANCOREADY) IVPB 1500 mg/300 mL  Status:  Discontinued        1,500 mg 150 mL/hr over 120 Minutes Intravenous Every 24 hours 10/11/23 1717 10/11/23 1730   10/12/23 1000  cefTRIAXone (ROCEPHIN) 2 g in sodium chloride  0.9 % 100 mL IVPB        2 g 200 mL/hr over 30 Minutes Intravenous Every 24 hours 10/11/23 1755 10/16/23 0959   10/11/23 2000  ceFEPIme (MAXIPIME) 2 g in sodium chloride  0.9 % 100 mL IVPB  Status:  Discontinued        2 g 200 mL/hr over 30 Minutes Intravenous Every 12 hours 10/11/23 1707 10/11/23 1727   10/11/23 2000  vancomycin (VANCOREADY) IVPB 1750 mg/350 mL  Status:  Discontinued        1,750 mg 175 mL/hr over 120 Minutes Intravenous  Once 10/11/23 1717 10/11/23 1731   10/11/23 2000  vancomycin (VANCOREADY) IVPB 1750 mg/350 mL  Status:   Discontinued        1,750 mg 175 mL/hr over 120 Minutes Intravenous Every 48 hours 10/11/23 1731 10/12/23 1255   10/11/23 1600  cefTRIAXone (ROCEPHIN) 2 g in sodium chloride  0.9 % 100 mL IVPB  Status:  Discontinued        2 g 200 mL/hr over 30 Minutes Intravenous Every 24 hours 10/11/23 1516 10/11/23 1640   10/11/23 1600  azithromycin (ZITHROMAX) 500 mg in sodium chloride  0.9 % 250 mL IVPB        500 mg 250 mL/hr over 60 Minutes Intravenous Every 24 hours 10/11/23 1518 10/16/23 1559   10/09/23 2200  vancomycin (VANCOCIN) IVPB 1000 mg/200 mL premix        1,000 mg 200 mL/hr over 60 Minutes Intravenous Every 12 hours 10/09/23 1526 10/09/23 2230   10/09/23 0830  vancomycin (VANCOCIN) IVPB 1000 mg/200 mL premix        1,000 mg 200 mL/hr over 60 Minutes Intravenous On call to O.R. 10/09/23 0816 10/09/23 1002   10/09/23 0600  gentamicin (GARAMYCIN) 460 mg in dextrose  5 % 100 mL IVPB        5 mg/kg  92.1 kg 111.5 mL/hr over 60 Minutes Intravenous On call to O.R. 10/09/23 0015 10/09/23 1612     .  She was given sequential compression devices, early ambulation, and Lovenox  TEDs for DVT prophylaxis.  She benefited maximally from the hospital stay and there were no complications.    Recent vital signs:  Vitals:   10/13/23 0800 10/13/23 1034  BP: 106/65 109/63  Pulse: 91 86  Resp: 15 14  Temp: (!) 97.4 F (36.3 C)   SpO2: 96% 94%    Recent laboratory studies:  Lab Results  Component Value Date   HGB 11.2 (L) 10/12/2023   HGB 11.4 (L) 10/11/2023   HGB 12.4 10/11/2023   Lab Results  Component Value Date   WBC 7.1 10/12/2023   PLT 155 10/12/2023   Lab Results  Component Value Date   INR 0.9 06/25/2023   Lab Results  Component Value Date   NA 138 10/12/2023   K 4.2 10/12/2023   CL 106 10/12/2023   CO2 25 10/12/2023   BUN 20 10/12/2023   CREATININE 0.91 10/12/2023   GLUCOSE 94 10/12/2023    Discharge Medications:   Allergies as of 10/13/2023       Reactions    Amoxicillin Hives, Shortness Of  Breath, Swelling   Has tolerated a 1st generation cephalosporin (CEFAZOLIN) in the past without documented ADRs.    Aspirin Anaphylaxis, Shortness Of Breath   Bacitracin-polymyx-neo-hc [bacitra-neomycin-polymyxin-hc] Rash   Ciprofloxacin Rash   Ciprofloxacin-dexamethasone  Anaphylaxis   Crestor  [rosuvastatin ] Other (See Comments)   Caused problems with memory.   Erythromycin Hives, Rash   Hydrochlorothiazide Other (See Comments), Palpitations   Raises her blood pressure.    Ibuprofen Other (See Comments)   Sharp stomach pains   Morphine And Codeine Nausea And Vomiting   Morpholine Salicylate Nausea Only   Omeprazole Palpitations, Other (See Comments)   Tachycardia and same sensation as her SVTs per patient   Doxycycline Hyclate Hives   Keflex [cephalexin] Hives   Has tolerated a 1st generation cephalosporin (CEFAZOLIN) in the past without documented ADRs.    Lovastatin Other (See Comments)   "Unable to function"   Simvastatin Other (See Comments)   "Unable to function"   Silicone Dermatitis, Hives   Neomycin-bacitracin Zn-polymyx Hives, Rash   Tape Hives, Rash        Medication List     STOP taking these medications    baclofen  10 MG tablet Commonly known as: LIORESAL    HYDROcodone -acetaminophen  5-325 MG tablet Commonly known as: NORCO/VICODIN   tiZANidine 2 MG tablet Commonly known as: ZANAFLEX       TAKE these medications    acetaminophen  325 MG tablet Commonly known as: TYLENOL  Take 1-2 tablets (325-650 mg total) by mouth every 6 (six) hours as needed for mild pain (pain score 1-3) (or temp > 100.5).   B-complex with vitamin C tablet Take 1 tablet by mouth daily.   calcium  carbonate 1250 (500 Ca) MG tablet Commonly known as: OS-CAL - dosed in mg of elemental calcium  Take 1 tablet by mouth daily with breakfast.   carvedilol  6.25 MG tablet Commonly known as: COREG  Take 1 tablet (6.25 mg total) by mouth 2 (two) times daily  with a meal.   cholecalciferol  25 MCG (1000 UNIT) tablet Commonly known as: VITAMIN D3 Take 1,000 Units by mouth daily.   Cranberry 1000 MG Caps Take 1,000 mg by mouth daily.   docusate sodium  100 MG capsule Commonly known as: COLACE Take 1 capsule (100 mg total) by mouth 2 (two) times daily.   enoxaparin  40 MG/0.4ML injection Commonly known as: LOVENOX  Inject 0.4 mLs (40 mg total) into the skin daily for 14 days.   Entresto  24-26 MG Generic drug: sacubitril-valsartan TAKE 1 TABLET BY MOUTH TWICE A DAY   ezetimibe  10 MG tablet Commonly known as: ZETIA  Take 1 tablet (10 mg total) by mouth daily.   fluticasone  110 MCG/ACT inhaler Commonly known as: FLOVENT  HFA Inhale 1 puff into the lungs 2 (two) times daily as needed.   gabapentin  300 MG capsule Commonly known as: NEURONTIN  Take 300-900 mg by mouth See admin instructions. Take 300 mg three times a day and 900 mg at bedtime   loratadine 10 MG tablet Commonly known as: CLARITIN Take 10 mg by mouth daily as needed for allergies.   multivitamin with minerals tablet Take 1 tablet by mouth daily. Centrum Silver   ondansetron  4 MG tablet Commonly known as: ZOFRAN  Take 1 tablet (4 mg total) by mouth every 6 (six) hours as needed for nausea.   spironolactone  25 MG tablet Commonly known as: ALDACTONE  TAKE 1 TABLET (25 MG TOTAL) BY MOUTH DAILY. What changed: how much to take   traMADol  50 MG tablet Commonly known as: ULTRAM  Take 1 tablet (50  mg total) by mouth every 6 (six) hours as needed for moderate pain (pain score 4-6).   Turmeric 500 MG Caps Take 500 mg by mouth daily.   zinc  sulfate (50mg  elemental zinc ) 220 (50 Zn) MG capsule Take 1 capsule (220 mg total) by mouth daily.        Diagnostic Studies: CT HEAD WO CONTRAST ( ) Result Date: 10/11/2023 CLINICAL DATA:  Confusion and meningitis suspected EXAM: CT HEAD WITHOUT CONTRAST TECHNIQUE: Contiguous axial images were obtained from the base of the skull  through the vertex without intravenous contrast. RADIATION DOSE REDUCTION: This exam was performed according to the departmental dose-optimization program which includes automated exposure control, adjustment of the mA and/or kV according to patient size and/or use of iterative reconstruction technique. COMPARISON:  Brain MRI 06/25/2023 FINDINGS: Brain: No mass, hemorrhage or extra-axial collection. Left frontal encephalomalacia, unchanged. Old right cerebellar small vessel infarcts. Vascular: No hyperdense vessel or unexpected vascular calcification. Skull: The visualized skull base, calvarium and extracranial soft tissues are normal. Sinuses/Orbits: No fluid levels or advanced mucosal thickening of the visualized paranasal sinuses. No mastoid or middle ear effusion. Normal orbits. Other: None. IMPRESSION: 1. No acute intracranial abnormality. 2. Left frontal encephalomalacia, unchanged. 3. Old right cerebellar small vessel infarcts. Electronically Signed   By: Juanetta Nordmann M.D.   On: 10/11/2023 21:39   DG Chest 2 View Result Date: 10/11/2023 CLINICAL DATA:  Fever. EXAM: CHEST - 2 VIEW COMPARISON:  Radiographs 03/03/2022 and 02/24/2022. Chest CT 01/28/2020. FINDINGS: Lower lung volumes with interval increased elevation of the right hemidiaphragm. Allowing for this, the heart size and mediastinal contours are stable. There is increased vascular congestion and bibasilar atelectasis. In addition, there is suspicion of a left perihilar infiltrate, with increased density projecting over the midthoracic spine on the lateral view. No evidence of pneumothorax or significant pleural effusion. Previous right shoulder reverse arthroplasty without acute osseous abnormality. IMPRESSION: 1. Lower lung volumes with increased vascular congestion and bibasilar atelectasis. 2. Suspected left perihilar infiltrate, suspicious for pneumonia. Followup PA and lateral chest X-ray is recommended in 4-6 weeks following appropriate  therapy to ensure resolution and exclude underlying malignancy. Electronically Signed   By: Elmon Hagedorn M.D.   On: 10/11/2023 09:58   DG HIP UNILAT WITH PELVIS 2-3 VIEWS RIGHT Result Date: 10/09/2023 CLINICAL DATA:  Hip surgery EXAM: DG HIP (WITH OR WITHOUT PELVIS) 2-3V RIGHT COMPARISON:  05/30/2023 FINDINGS: Three low resolution intraoperative spot views of the right hip. Total fluoroscopy time was 18 seconds, fluoroscopic dose of 1.9155 mGy. The images demonstrate a right hip replacement with normal alignment IMPRESSION: Intraoperative fluoroscopic assistance provided during right hip replacement. Electronically Signed   By: Esmeralda Hedge M.D.   On: 10/09/2023 16:19   DG C-Arm 1-60 Min-No Report Result Date: 10/09/2023 Fluoroscopy was utilized by the requesting physician.  No radiographic interpretation.   DG C-Arm 1-60 Min-No Report Result Date: 10/09/2023 Fluoroscopy was utilized by the requesting physician.  No radiographic interpretation.    Disposition: Discharge disposition: 03-Skilled Nursing Facility          Follow-up Information     Coralyn Derry, PA-C Follow up in 2 week(s).   Specialties: Orthopedic Surgery, Emergency Medicine Contact information: [address] [place] [address] [phone]                  Signed: Coralyn Derry 10/13/2023, 10:49 AM

## 2023-10-13 NOTE — TOC Transition Note (Signed)
 Transition of Care [facility]) - Discharge Note   Patient Details  Name: Audrey Martinez MRN: [id number] Date of Birth: [birth year]  Transition of Care [facility]) CM/SW Contact:  Alexandra Ice, RN Phone Number: 10/13/2023, 9:59 AM   Clinical Narrative:     Patient ready to discharge today to [facility], they are able to accept patient. Spoke with Lydia at [facility], can pick up at 12pm. Patient going to room 103, call report to [phone]. Notified bedside nurse and MD.   Final next level of care: Skilled Nursing Facility Barriers to Discharge: Barriers Resolved   Patient Goals and CMS Choice Patient states their goals for this hospitalization and ongoing recovery are:: confursed CMS Medicare.gov Compare Post Acute Care list provided to:: Patient Represenative (must comment) [name]) Choice offered to / list presented to : Adult Children, Patient      Discharge Placement                Patient to be transferred to facility by: [facility] Name of family member notified: Cedrianna Opitz Patient and family notified of of transfer: 10/13/23  Discharge Plan and Services Additional resources added to the After Visit Summary for                    DME Agency: NA       HH Arranged: NA          Social Drivers of Health (SDOH) Interventions SDOH Screenings   Food Insecurity: No Food Insecurity (08/02/2023)   Received from [facility]  Housing: Low Risk  (08/02/2023)   Received from [facility]  Transportation Needs: No Transportation Needs (08/02/2023)   Received from [facility]  Utilities: Not At Risk (08/02/2023)   Received from [facility]  Alcohol Screen: Low Risk  (04/24/2023)  Depression (PHQ2-9): Low Risk  (07/06/2023)  Financial Resource Strain: Low Risk  (08/02/2023)   Received from [facility]  Physical Activity: Insufficiently Active (04/24/2023)  Social  Connections: Moderately Isolated (04/24/2023)  Stress: No Stress Concern Present (04/24/2023)  Tobacco Use: Low Risk  (10/11/2023)  Health Literacy: Adequate Health Literacy (04/24/2023)     Readmission Risk Interventions     No data to display

## 2023-10-15 ENCOUNTER — Encounter: Payer: Self-pay | Admitting: Student

## 2023-10-15 NOTE — Progress Notes (Signed)
 Provider:  Dr.Staria Birkhead Location:  Other Nursing Home Room Number: [facility] - 106A Place of Service:  SNF (31)  PCP: Sheron Dixons, MD Patient Care Team: Sheron Dixons, MD as PCP - General (Internal Medicine) Boyce Byes, MD as PCP - Electrophysiology (Cardiology) Cleve Dale, MD as Consulting Physician (Pulmonary Disease) Devorah Fonder, MD as Consulting Physician (Cardiology) Artemio Larry, MD (Dermatology) Venus Ginsberg, MD as Consulting Physician (Orthopedic Surgery) Jerlyn Moons, MD as Referring Physician (Orthopedic Surgery) Pa, [facility])  Extended Emergency Contact Information Primary Emergency Contact: [name]  [place] Mobile Phone: [phone] Relation: Son Secondary Emergency Contact: [name]  [place] Phone: [phone] Relation: Son  Code Status: FULL CODE Goals of Care: Advanced Directive information    10/16/2023    9:59 AM  Advanced Directives  Does Patient Have a Medical Advance Directive? No  Would patient like information on creating a medical advance directive? No - Patient declined      Chief Complaint  Patient presents with   Admission    HPI: Patient is a 78 y.o. female seen today for admission to [facility].  Discussed the use of AI scribe software for clinical note transcription with the patient, who gave verbal consent to proceed.  History of Present Illness   She had a hip replacement on 10/09/2023. Started on lovenox  40 mg q24 hours. Post op received IV antibiotics due to concern for pneumonia on POD1. On abx for 3 days and abx discontinued. Slow progress with PT and was transferred to facility for further management.   History of Present Illness The patient, with high blood pressure, high cholesterol, and heart failure, presents for postoperative care following hip replacement surgery.  She underwent a planned hip replacement  surgery on Oct 09, 2023, due to severe pain and deterioration of the hip joint. The hip joint was described as 'raggedy' and 'worn down', causing excruciating pain with movement, including taking steps or moving in bed. Post-surgery, she reports significant improvement in pain, although she describes a 'nummy sore' sensation at the surgical site.  Postoperatively, she experienced a complication suspected to be pneumonia, for which she received antibiotics. She was hospitalized for five days following the surgery and is currently in a rehabilitation facility since Oct 13, 2023. She anticipates a follow-up appointment in two to three weeks.  She has been using a walker for mobility due to the hip pain and has transitioned from a four-footed walker to a rollator over time. She lives alone and was managing daily activities independently before the surgery. She has not used a wheelchair regularly and only uses it occasionally.  Her current medications include Tylenol  650 mg every six hours as needed, gabapentin  300 mg three times a day and three capsules at night, azithromycin  for a few days, vitamin B complex, calcium , carvedilol  6.25 mg twice a day, vitamin D , Claritin as needed, cranberry supplements, docusate post-surgery, Lovenox  injections postoperatively, ezetimibe  10 mg, an inhaler as needed, spironolactone  25 mg, tramadol  every six hours for pain, turmeric, and zinc  supplements. She also has Zofran  available for nausea.  She is originally from [place]  and has been living in [place]  for a couple of years. Her family includes three sons, two of whom live in [place]  and one in [place] . She lives alone and has two dogs.    Past Medical History:  Diagnosis Date   Actinic keratosis    Acute renal failure (ARF) (  HCC) 01/31/2020   Acute respiratory failure with hypoxia (HCC) 01/29/2020   Amaurosis fugax, right eye 12/2018   Aortic atherosclerosis (HCC)    Arthritis     Asthma    Atrial tachycardia (HCC)    a.) s/p RFCA for AVNRT in 2008 at [facility] [place]    Basal cell carcinoma 07/2016   central forehead (tx in [place] )   Bilateral pulmonary embolism (HCC) 01/28/2020   a.) in setting of SARS-CoV-2 infection (12/2019); noted BILATERALLY with (+) associated RIGHT heart strain; Tx'd with rivaroxaban  Xarelto  20 mg daily x 6 months then PRN   Cerebellar infarct (RIGHT) 1998   Coronary artery disease    DDD (degenerative disc disease), cervical    Dyspnea    Facial laceration 06/16/2022   Date of injury: 06/08/2022  [facility] ED history below:  77 year old female presenting to the emergency department after mechanical, nonsyncopal fall causing her to strike her face on the edge/corner of a curio cabinet and sustaining a deep and approximate 8 cm laceration to the right cheek in an irregular pattern to the angle of the right lower jaw. Wound does not proceed th   GERD (gastroesophageal reflux disease)    Herniated disc, lumbar    HFrEF (heart failure with reduced ejection fraction) (HCC)    History of 2019 novel coronavirus disease (COVID-19) 12/2019   History of bilateral cataract extraction    Hyperlipidemia    Incomplete left bundle branch block (LBBB)    Left wrist fracture    Lumbar stenosis    s/p LEFT L2-4 hemilaminectomies and partial facetectomies, foraminotomies for lateral recess and foraminal decompression of L3 and L4 nerve roots   Melanoma (HCC) 05/12/2020   Melanoma IS R post neck, excised 08/10/20   Mobitz type 1 second degree atrioventricular block    a.) evaluated by EP --> paroxysmal in nature; nocturnal epidoe felt to be vagally mediated   NICM (nonischemic cardiomyopathy) (HCC)    a. 04/2018 Echo: EF 45-50%. Anteroseptal and apical HK in some views. Mild AI/MR. Nl RV fxn; b. 05/2018 MV: mid-dist ant and antsept mild defect - ? breast atten. EF 41%. No ischemia; c. 03/2019 Echo: EF 30-35%, Gr2  DD, Trace MR, triv TR. Asc Ao ectatic dil - 38mm.    Osteoporosis    Personal history of transient ischemic attack (TIA), and cerebral infarction without residual deficits 04/22/2003   Pneumonia due to COVID-19 virus 01/26/2020   SVT (supraventricular tachycardia) (HCC)    a.2008 s/p RFCA for AVNRT; b. 9 & 02/2019 Zio x 2: 1. Avg HR 89, 3 runs NSVT (longest 7 beats), Mobitz 1. 2. Avg HR 89, NSVT x 1 (5 beats), Mobitz 1. No signif arrhythmia.   Tendinitis of right knee 07/03/2017   Past Surgical History:  Procedure Laterality Date   APPENDECTOMY     BREAST CYST ASPIRATION Left 1977   CARDIAC CATHETERIZATION  2009   [facility] [place] [facility]   CARDIAC ELECTROPHYSIOLOGY STUDY AND ABLATION  2009   CATARACT EXTRACTION, BILATERAL  2010   COLONOSCOPY WITH PROPOFOL  N/A 03/21/2018   Procedure: COLONOSCOPY WITH Biopsy;  Surgeon: Selena Daily, MD;  Location: [facility];  Service: Endoscopy;  Laterality: N/A;   COLONOSCOPY WITH PROPOFOL  N/A 05/03/2021   Procedure: COLONOSCOPY WITH PROPOFOL ;  Surgeon: Selena Daily, MD;  Location: [facility] ENDOSCOPY;  Service: Gastroenterology;  Laterality: N/A;   CORONARY ANGIOPLASTY     DARRACH'S PROCEDURE Bilateral 1977   ESOPHAGOGASTRODUODENOSCOPY  2013  gastritis; done for complaint of dysphagia   ESOPHAGOGASTRODUODENOSCOPY (EGD) WITH PROPOFOL  N/A 03/21/2018   Procedure: ESOPHAGOGASTRODUODENOSCOPY (EGD) WITH Biopsies;  Surgeon: Selena Daily, MD;  Location: [facility];  Service: Endoscopy;  Laterality: N/A;  KEEP THIS PATIENT FIRST   JOINT REPLACEMENT     LEFT HEART CATH AND CORONARY ANGIOGRAPHY Left 05/02/2019   Procedure: LEFT HEART CATH AND CORONARY ANGIOGRAPHY;  Surgeon: Devorah Fonder, MD;  Location: [facility] INVASIVE CV LAB;  Service: Cardiovascular;  Laterality: Left;   LUMBAR LAMINECTOMY  2017   Procedure: LUMBAR LAMINECTOMY (L2-L4)   LYMPH NODE REMOVAL (NECK)     MOHS SURGERY  07/2016   BCCA nose    POLYPECTOMY N/A 03/21/2018   Procedure: POLYPECTOMY;  Surgeon: Selena Daily, MD;  Location: [facility] SUR[facility]ERY [facility];  Service: Endoscopy;  Laterality: N/A;   REPLACEMENT TOTAL KNEE Right 2013   TONSILLECTOMY     TOTAL HIP ARTHROPLASTY Right 10/09/2023   Procedure: ARTHROPLASTY, HIP, TOTAL, ANTERIOR APPROACH;  Surgeon: Venus Ginsberg, MD;  Location: [facility] ORS;  Service: Orthopedics;  Laterality: Right;   TOTAL SHOULDER REPLACEMENT Right 2012    reports that she has never smoked. She has never used smokeless tobacco. She reports that she does not currently use alcohol. She reports that she does not use drugs. Social History   Socioeconomic History   Marital status: Widowed    Spouse name: Not on file   Number of children: 3   Years of education: Not on file   Highest education level: Not on file  Occupational History   Occupation: retired  Tobacco Use   Smoking status: Never   Smokeless tobacco: Never  Vaping Use   Vaping status: Never Used  Substance and Sexual Activity   Alcohol use: Not Currently   Drug use: No   Sexual activity: Never  Other Topics Concern   Not on file  Social History Narrative   ** Merged History Encounter **       Lives at home alone, has resources if help is needed   Social Drivers of Health   Financial Resource Strain: Low Risk  (08/02/2023)   Received from [facility]   Overall Financial Resource Strain (CARDIA)    Difficulty of Paying Living Expenses: Not hard at all  Food Insecurity: No Food Insecurity (08/02/2023)   [facility]   Hunger Vital Sign    Worried About Running Out of Food in the Last Year: Never true    Ran Out of Food in the Last Year: Never true  Transportation Needs: No Transportation Needs (08/02/2023)   [facility] - Transportation    In the past 12 months, has lack of transportation kept you from medical appointments or from getting  medications?: No    Lack of Transportation (Non-Medical): No  Physical Activity: Insufficiently Active (04/24/2023)   Exercise Vital Sign    Days of Exercise per Week: 4 days    Minutes of Exercise per Session: 20 min  Stress: No Stress Concern Present (04/24/2023)   [employer] of Occupational Health - Occupational Stress Questionnaire    Feeling of Stress : Not at all  Social Connections: Moderately Isolated (04/24/2023)   Social Connection and Isolation Panel [NHANES]    Frequency of Communication with Friends and Family: More than three times a week    Frequency of Social Gatherings with Friends and Family: Three times a week    Attends  Religious Services: More than 4 times per year    Active Member of Clubs or Organizations: No    Attends Banker Meetings: Never    Marital Status: Widowed  Intimate Partner Violence: Not At Risk (10/09/2023)   Humiliation, Afraid, Rape, and Kick questionnaire    Fear of Current or Ex-Partner: No    Emotionally Abused: No    Physically Abused: No    Sexually Abused: No    Functional Status Survey:    Family History  Problem Relation Age of Onset   Diabetes Mother    Stroke Mother    Valvular heart disease Father    CAD Sister 71       died of MI   Stroke Sister    Stroke Sister 62   Clotting disorder Half-Sister    Clotting disorder Half-Sister    Breast cancer Neg Hx     Health Maintenance  Topic Date Due   COVID-19 Vaccine (1) Never done   Zoster Vaccines- Shingrix (1 of 2) Never done   MAMMOGRAM  12/27/2023   INFLUENZA VACCINE  12/29/2023   Medicare Annual Wellness (AWV)  04/23/2024   Colonoscopy  05/03/2024   DTaP/Tdap/Td (3 - Td or Tdap) 06/08/2032   Pneumonia Vaccine 13+ Years old  Completed   DEXA SCAN  Completed   Hepatitis C Screening  Completed   HPV VACCINES  Aged Out   Meningococcal B Vaccine  Aged Out    Allergies  Allergen Reactions   Amoxicillin Hives, Shortness Of Breath and Swelling     Has tolerated a 1st generation cephalosporin (CEFAZOLIN) in the past without documented ADRs.    Aspirin Anaphylaxis and Shortness Of Breath   Bacitracin-Polymyx-Neo-Hc [Bacitra-Neomycin-Polymyxin-Hc] Rash   Ciprofloxacin Rash   Ciprofloxacin-Dexamethasone  Anaphylaxis   Crestor  [Rosuvastatin ] Other (See Comments)    Caused problems with memory.   Erythromycin Hives and Rash   Hydrochlorothiazide Other (See Comments) and Palpitations    Raises her blood pressure.    Ibuprofen Other (See Comments)    Sharp stomach pains   Morphine  And Codeine Nausea And Vomiting   Morpholine Salicylate Nausea Only   Omeprazole Palpitations and Other (See Comments)    Tachycardia and same sensation as her SVTs per patient   Doxycycline Hyclate Hives   Keflex [Cephalexin] Hives    Has tolerated a 1st generation cephalosporin (CEFAZOLIN) in the past without documented ADRs.     Lovastatin Other (See Comments)    "Unable to function"   Simvastatin Other (See Comments)    "Unable to function"   Silicone Dermatitis and Hives   Neomycin-Bacitracin Zn-Polymyx Hives and Rash   Tape Hives and Rash    Outpatient Encounter Medications as of 10/16/2023  Medication Sig   acetaminophen  (TYLENOL ) 325 MG tablet Take 1-2 tablets (325-650 mg total) by mouth every 6 (six) hours as needed for mild pain (pain score 1-3) (or temp > 100.5).   azithromycin  (ZITHROMAX ) 500 MG tablet Take 500 mg by mouth at bedtime.   B Complex-C (B-COMPLEX WITH VITAMIN C) tablet Take 1 tablet by mouth daily.    calcium  carbonate (OS-CAL - DOSED IN MG OF ELEMENTAL CALCIUM ) 1250 (500 Ca) MG tablet Take 1 tablet by mouth daily with breakfast.   carvedilol  (COREG ) 6.25 MG tablet Take 1 tablet (6.25 mg total) by mouth 2 (two) times daily with a meal.   cholecalciferol  (VITAMIN D ) 25 MCG (1000 UT) tablet Take 1,000 Units by mouth at bedtime.   Cranberry 1000 MG CAPS  Take 1,000 mg by mouth daily.    docusate sodium  (COLACE) 100 MG capsule Take 1  capsule (100 mg total) by mouth 2 (two) times daily.   enoxaparin  (LOVENOX ) 40 MG/0.4ML injection Inject 0.4 mLs (40 mg total) into the skin daily for 14 days.   ezetimibe  (ZETIA ) 10 MG tablet Take 1 tablet (10 mg total) by mouth daily.   fluticasone  (FLOVENT  HFA) 110 MCG/ACT inhaler Inhale 1 puff into the lungs every 12 (twelve) hours as needed.   gabapentin  (NEURONTIN ) 300 MG capsule Take 300 mg by mouth 3 (three) times daily.   gabapentin  (NEURONTIN ) 300 MG capsule Take 900 mg by mouth at bedtime.   loratadine (CLARITIN) 10 MG tablet Take 10 mg by mouth daily as needed for allergies.   Multiple Vitamins-Minerals (MULTIVITAMIN WITH MINERALS) tablet Take 1 tablet by mouth daily. Centrum Silver   ondansetron  (ZOFRAN ) 4 MG tablet Take 1 tablet (4 mg total) by mouth every 6 (six) hours as needed for nausea.   sacubitril -valsartan  (ENTRESTO ) 24-26 MG TAKE 1 TABLET BY MOUTH TWICE A DAY   spironolactone  (ALDACTONE ) 25 MG tablet TAKE 1 TABLET (25 MG TOTAL) BY MOUTH DAILY.   traMADol  (ULTRAM ) 50 MG tablet Take 1 tablet (50 mg total) by mouth every 6 (six) hours as needed for moderate pain (pain score 4-6).   tuberculin 5 UNIT/0.1ML injection Inject 0.1 mLs into the skin as directed. One time only for protocol until 10/23/2023.   Turmeric 500 MG CAPS Take 500 mg by mouth daily.    zinc  sulfate 220 (50 Zn) MG capsule Take 1 capsule (220 mg total) by mouth daily.   tiZANidine (ZANAFLEX) 2 MG tablet Take 1 tablet by mouth every 8 (eight) hours as needed. (Patient not taking: Reported on 10/16/2023)   No facility-administered encounter medications on file as of 10/16/2023.    Review of Systems  Vitals:   10/16/23 0943  BP: 120/67  Pulse: 100  Resp: 17  Temp: 97.8 F (36.6 C)  SpO2: 91%  Weight: 200 lb 12.8 oz (91.1 kg)  Height: 5\' 2"  (1.575 m)   Body mass index is 36.73 kg/m. Physical Exam Constitutional:      Appearance: Normal appearance.  Cardiovascular:     Rate and Rhythm: Normal rate.   Pulmonary:     Effort: Pulmonary effort is normal.  Abdominal:     General: Abdomen is flat. Bowel sounds are normal.  Musculoskeletal:     Comments: RLE - wound clean, dry, intact  Neurological:     Mental Status: She is alert. She is disoriented.    Physical Exam   Labs reviewed: Basic Metabolic Panel: Recent Labs    10/10/23 0628 10/11/23 1752 10/12/23 0655  NA 140 139 138  K 4.3 3.9 4.2  CL 107 108 106  CO2 25 23 25   GLUCOSE 122* 107* 94  BUN 21 19 20   CREATININE 0.91 0.85 0.91  CALCIUM  9.3 8.5* 8.8*   Liver Function Tests: Recent Labs    06/25/23 1638 10/03/23 1401 10/11/23 1752  AST 22 16 21   ALT 17 12 21   ALKPHOS 66 50 37*  BILITOT 0.8 0.9 1.0  PROT 6.1* 6.4* 5.2*  ALBUMIN 3.7 4.0 2.9*   No results for input(s): "LIPASE", "AMYLASE" in the last 8760 hours. No results for input(s): "AMMONIA" in the last 8760 hours. CBC: Recent Labs    06/25/23 1638 10/03/23 1401 10/10/23 0628 10/11/23 0626 10/11/23 1752 10/12/23 0655  WBC 6.3 6.9   < > 9.7  9.7 7.1  NEUTROABS 3.5 4.3  --   --  7.0  --   HGB 13.9 15.3*   < > 12.4 11.4* 11.2*  HCT 42.8 46.2*   < > 38.0 34.3* 33.2*  MCV 96.6 92.8   < > 92.7 91.5 92.0  PLT 177 240   < > 149* 164 155   < > = values in this interval not displayed.   Cardiac Enzymes: No results for input(s): "CKTOTAL", "CKMB", "CKMBINDEX", "TROPONINI" in the last 8760 hours. BNP: Invalid input(s): "POCBNP" Lab Results  Component Value Date   HGBA1C 5.1 06/25/2023   Lab Results  Component Value Date   TSH 2.630 06/24/2022   No results found for: "VITAMINB12" No results found for: "FOLATE" Lab Results  Component Value Date   FERRITIN 648 (H) 02/01/2020   Results    Imaging and Procedures obtained prior to SNF admission: DG HIP UNILAT WITH PELVIS 2-3 VIEWS RIGHT Result Date: 10/09/2023 CLINICAL DATA:  Hip surgery EXAM: DG HIP (WITH OR WITHOUT PELVIS) 2-3V RIGHT COMPARISON:  05/30/2023 FINDINGS: Three low resolution  intraoperative spot views of the right hip. Total fluoroscopy time was 18 seconds, fluoroscopic dose of 1.9155 mGy. The images demonstrate a right hip replacement with normal alignment IMPRESSION: Intraoperative fluoroscopic assistance provided during right hip replacement. Electronically Signed   By: Esmeralda Hedge M.D.   On: 10/09/2023 16:19   DG C-Arm 1-60 Min-No Report Result Date: 10/09/2023 Fluoroscopy was utilized by the requesting physician.  No radiographic interpretation.   DG C-Arm 1-60 Min-No Report Result Date: 10/09/2023 Fluoroscopy was utilized by the requesting physician.  No radiographic interpretation.    Assessment/Plan Postoperative care following hip replacement Postoperative care following planned hip replacement surgery. She reports significant improvement in pain, previously excruciating, with current numbness and soreness at the surgical site. Dressing is clean, dry, and intact with no drainage. She uses a rollator for mobility and is advised to focus on protein intake to aid recovery. Full recovery may take up to six months. - Monitor surgical site for infection or drainage - Encourage protein intake of 30 grams per meal to support muscle strength and healing - Ensure use of assistive devices for mobility and request assistance for bathroom use to prevent falls  Delirium post-surgery Exhibits signs of delirium post-surgery, including confusion about time and events, possibly related to recent anesthesia and hospitalization. Family reports possible pre-existing dementia. - Monitor cognitive status and reorient as needed - Discuss with family the need for increased support at home post-discharge  Pneumonia post-surgery Suspected pneumonia post-surgery treated with azithromycin . No current respiratory symptoms reported. - Complete current course of azithromycin  - Monitor for signs of respiratory infection  Heart failure Heart failure managed with carvedilol  and  spironolactone . No acute exacerbations reported. - Continue carvedilol  6.25 mg twice a day - Continue spironolactone  25 mg daily  Hypertension Hypertension managed with carvedilol  and spironolactone . No acute issues reported. - Continue carvedilol  6.25 mg twice a day - Continue spironolactone  25 mg daily  Hyperlipidemia Hyperlipidemia managed with ezetimibe . No acute issues reported. - Continue ezetimibe  10 mg daily  Goals of Care She expressed a preference for a do not resuscitate (DNR) order in the event of cardiac arrest and prefers hospital return if condition worsens significantly, such as severe pneumonia. - Ensure DNR form is completed and placed in her medical record - Discuss with family to ensure awareness of her preferences - Advise posting DNR form on refrigerator at home for EMS awareness  Family/ staff Communication: Nursing, son  Labs/tests ordered:CBC, BMP  I spent greater than 45  minutes for the care of this patient in face to face time, chart review, clinical documentation, patient education. I spent an additional 16 minutes discussing goals of care and advanced care planning.

## 2023-10-16 ENCOUNTER — Encounter: Payer: Self-pay | Admitting: Student

## 2023-10-16 ENCOUNTER — Non-Acute Institutional Stay (SKILLED_NURSING_FACILITY): Payer: Self-pay | Admitting: Student

## 2023-10-16 DIAGNOSIS — I420 Dilated cardiomyopathy: Secondary | ICD-10-CM | POA: Diagnosis not present

## 2023-10-16 DIAGNOSIS — K5900 Constipation, unspecified: Secondary | ICD-10-CM | POA: Diagnosis not present

## 2023-10-16 DIAGNOSIS — I471 Supraventricular tachycardia, unspecified: Secondary | ICD-10-CM | POA: Diagnosis not present

## 2023-10-16 DIAGNOSIS — I10 Essential (primary) hypertension: Secondary | ICD-10-CM

## 2023-10-16 DIAGNOSIS — Z96641 Presence of right artificial hip joint: Secondary | ICD-10-CM

## 2023-10-16 DIAGNOSIS — I5020 Unspecified systolic (congestive) heart failure: Secondary | ICD-10-CM | POA: Diagnosis not present

## 2023-10-16 DIAGNOSIS — N1832 Chronic kidney disease, stage 3b: Secondary | ICD-10-CM

## 2023-10-16 DIAGNOSIS — J4520 Mild intermittent asthma, uncomplicated: Secondary | ICD-10-CM

## 2023-10-16 LAB — CULTURE, BLOOD (ROUTINE X 2)
Culture: NO GROWTH
Culture: NO GROWTH
Special Requests: ADEQUATE

## 2023-10-16 LAB — CBC AND DIFFERENTIAL
HCT: 36 (ref 36–46)
Hemoglobin: 11.5 — AB (ref 12.0–16.0)
Neutrophils Absolute: 3857
Platelets: 228 10*3/uL (ref 150–400)
WBC: 5.8

## 2023-10-16 LAB — BASIC METABOLIC PANEL WITH GFR
BUN: 17 (ref 4–21)
CO2: 27 — AB (ref 13–22)
Chloride: 105 (ref 99–108)
Creatinine: 0.9 (ref 0.5–1.1)
Glucose: 83
Potassium: 4.2 meq/L (ref 3.5–5.1)
Sodium: 141 (ref 137–147)

## 2023-10-16 LAB — CBC: RBC: 3.79 — AB (ref 3.87–5.11)

## 2023-10-16 LAB — COMPREHENSIVE METABOLIC PANEL WITH GFR
Calcium: 9 (ref 8.7–10.7)
eGFR: 70

## 2023-10-20 ENCOUNTER — Non-Acute Institutional Stay: Payer: Self-pay | Admitting: Student

## 2023-10-20 ENCOUNTER — Encounter: Payer: Self-pay | Admitting: Student

## 2023-10-20 ENCOUNTER — Other Ambulatory Visit: Payer: Self-pay | Admitting: *Deleted

## 2023-10-20 NOTE — Progress Notes (Unsigned)
 Audrey Martinez

## 2023-10-20 NOTE — Patient Outreach (Signed)
 Per [facility] Audrey Martinez resides in [facility]. [facility] SNF waiver was utilized for admission.   Secure communication sent to Audrey Martinez Admissions Director to inquire about transition plans.   Will continue to follow.   Audrey Batty, MSN, RN, BSN [place]  [facility], [facility] RN Post- Acute Care Manager Direct Dial: [phone]

## 2023-10-24 NOTE — Progress Notes (Signed)
 This encounter was created in error - please disregard.

## 2023-10-25 ENCOUNTER — Other Ambulatory Visit: Payer: Self-pay | Admitting: *Deleted

## 2023-10-25 NOTE — Patient Outreach (Signed)
 Mrs. Paglia resides in [place]. [facility] SNF waiver was utilized for admission.   Collaboration with Robert Chimes Admissions Director. Mrs. Ledonne plans to return home upon SNF discharge. Continues to work with therapy. No dc date set yet.   Will continue to follow.   Nolberto Batty, MSN, RN, BSN [place]  [facility], [facility] Commun[facility] RN Post- Acute Care Manager Direct Dial: [phone]

## 2023-11-02 ENCOUNTER — Non-Acute Institutional Stay (SKILLED_NURSING_FACILITY): Payer: Self-pay | Admitting: Adult Health

## 2023-11-02 ENCOUNTER — Encounter: Payer: Self-pay | Admitting: Adult Health

## 2023-11-02 DIAGNOSIS — M48061 Spinal stenosis, lumbar region without neurogenic claudication: Secondary | ICD-10-CM | POA: Diagnosis not present

## 2023-11-02 DIAGNOSIS — E782 Mixed hyperlipidemia: Secondary | ICD-10-CM | POA: Diagnosis not present

## 2023-11-02 DIAGNOSIS — I10 Essential (primary) hypertension: Secondary | ICD-10-CM | POA: Diagnosis not present

## 2023-11-02 DIAGNOSIS — Z96641 Presence of right artificial hip joint: Secondary | ICD-10-CM | POA: Diagnosis not present

## 2023-11-02 DIAGNOSIS — G250 Essential tremor: Secondary | ICD-10-CM

## 2023-11-02 DIAGNOSIS — I5020 Unspecified systolic (congestive) heart failure: Secondary | ICD-10-CM

## 2023-11-02 NOTE — Progress Notes (Unsigned)
 Location:  [facility] Room Number: [place] [id number] [phone]) Place of Service:  SNF (31) Provider:  Medina-Vargas, Verlaine Embry, DNP, FNP-BC  Patient Care Team: Sheron Dixons, MD as PCP - General (Internal Medicine) Boyce Byes, MD as PCP - Electrophysiology (Cardiology) Cleve Dale, MD as Consulting Physician (Pulmonary Disease) Devorah Fonder, MD as Consulting Physician (Cardiology) Artemio Larry, MD (Dermatology) Venus Ginsberg, MD as Consulting Physician (Orthopedic Surgery) Jerlyn Moons, MD as Referring Physician (Orthopedic Surgery) Pa, [facility])  Extended Emergency Contact Information Primary Emergency Contact: Audrey Martinez  [place] Mobile Phone: [phone] Relation: Son Secondary Emergency Contact: Audrey Martinez  [place] Phone: [phone] Relation: Son  Code Status:  DNR  Goals of care: Advanced Directive information    11/02/2023   10:26 AM  Advanced Directives  Does Patient Have a Medical Advance Directive? No  Would patient like information on creating a medical advance directive? No - Patient declined     Chief Complaint  Patient presents with   Discharge Note     for discharge home on Saturday. In addition needs to discuss Covid and Shingrix vaccine.    HPI:  Pt is a 78 y.o. female seen today for medical management of chronic diseases.  ***   Past Medical History:  Diagnosis Date   Actinic keratosis    Acute renal failure (ARF) (HCC) 01/31/2020   Acute respiratory failure with hypoxia (HCC) 01/29/2020   Amaurosis fugax, right eye 12/2018   Aortic atherosclerosis (HCC)    Arthritis    Asthma    Atrial tachycardia (HCC)    a.) s/p RFCA for AVNRT in 2008 at [facility] [place]    Basal cell carcinoma 07/2016   central forehead (tx in [place] )   Bilateral pulmonary embolism (HCC) 01/28/2020   a.) in setting of SARS-CoV-2  infection (12/2019); noted BILATERALLY with (+) associated RIGHT heart strain; Tx'd with rivaroxaban  Xarelto  20 mg daily x 6 months then PRN   Cerebellar infarct (RIGHT) 1998   Coronary artery disease    DDD (degenerative disc disease), cervical    Dyspnea    Facial laceration 06/16/2022   Date of injury: 06/08/2022  [facility] ED history below:  78 year old female presenting to the emergency department after mechanical, nonsyncopal fall causing her to strike her face on the edge/corner of a curio cabinet and sustaining a deep and approximate 8 cm laceration to the right cheek in an irregular pattern to the angle of the right lower jaw. Wound does not proceed th   GERD (gastroesophageal reflux disease)    Herniated disc, lumbar    HFrEF (heart failure with reduced ejection fraction) (HCC)    History of 2019 novel coronavirus disease (COVID-19) 12/2019   History of bilateral cataract extraction    Hyperlipidemia    Incomplete left bundle branch block (LBBB)    Left wrist fracture    Lumbar stenosis    s/p LEFT L2-4 hemilaminectomies and partial facetectomies, foraminotomies for lateral recess and foraminal decompression of L3 and L4 nerve roots   Melanoma (HCC) 05/12/2020   Melanoma IS R post neck, excised 08/10/20   Mobitz type 1 second degree atrioventricular block    a.) evaluated by EP --> paroxysmal in nature; nocturnal epidoe felt to be vagally mediated   NICM (nonischemic cardiomyopathy) (HCC)    a. 04/2018 Echo: EF 45-50%. Anteroseptal and apical HK in some views. Mild AI/MR. Nl RV fxn; b. 05/2018  MV: mid-dist ant and antsept mild defect - ? breast atten. EF 41%. No ischemia; c. 03/2019 Echo: EF 30-35%, Gr2 DD, Trace MR, triv TR. Asc Ao ectatic dil - 38mm.    Osteoporosis    Personal history of transient ischemic attack (TIA), and cerebral infarction without residual deficits 04/22/2003   Pneumonia due to COVID-19 virus 01/26/2020   SVT (supraventricular  tachycardia) (HCC)    a.2008 s/p RFCA for AVNRT; b. 9 & 02/2019 Zio x 2: 1. Avg HR 89, 3 runs NSVT (longest 7 beats), Mobitz 1. 2. Avg HR 89, NSVT x 1 (5 beats), Mobitz 1. No signif arrhythmia.   Tendinitis of right knee 07/03/2017   Past Surgical History:  Procedure Laterality Date   APPENDECTOMY     BREAST CYST ASPIRATION Left 1977   CARDIAC CATHETERIZATION  2009   [facility] [place] [facility]   CARDIAC ELECTROPHYSIOLOGY STUDY AND ABLATION  2009   CATARACT EXTRACTION, BILATERAL  2010   COLONOSCOPY WITH PROPOFOL  N/A 03/21/2018   Procedure: COLONOSCOPY WITH Biopsy;  Surgeon: Selena Daily, MD;  Location: [facility];  Service: Endoscopy;  Laterality: N/A;   COLONOSCOPY WITH PROPOFOL  N/A 05/03/2021   Procedure: COLONOSCOPY WITH PROPOFOL ;  Surgeon: Selena Daily, MD;  Location: [facility];  Service: Gastroenterology;  Laterality: N/A;   CORONARY ANGIOPLASTY     DARRACH'S PROCEDURE Bilateral 1977   ESOPHAGOGASTRODUODENOSCOPY  2013   gastritis; done for complaint of dysphagia   ESOPHAGOGASTRODUODENOSCOPY (EGD) WITH PROPOFOL  N/A 03/21/2018   Procedure: ESOPHAGOGASTRODUODENOSCOPY (EGD) WITH Biopsies;  Surgeon: Selena Daily, MD;  Location: [facility];  Service: Endoscopy;  Laterality: N/A;  KEEP THIS PATIENT FIRST   JOINT REPLACEMENT     LEFT HEART CATH AND CORONARY ANGIOGRAPHY Left 05/02/2019   Procedure: LEFT HEART CATH AND CORONARY ANGIOGRAPHY;  Surgeon: Devorah Fonder, MD;  Location: [facility] INVASIVE CV LAB;  Service: Cardiovascular;  Laterality: Left;   LUMBAR LAMINECTOMY  2017   Procedure: LUMBAR LAMINECTOMY (L2-L4)   LYMPH NODE REMOVAL (NECK)     MOHS SURGERY  07/2016   BCCA nose   POLYPECTOMY N/A 03/21/2018   Procedure: POLYPECTOMY;  Surgeon: Selena Daily, MD;  Location: [facility];  Service: Endoscopy;  Laterality: N/A;   REPLACEMENT TOTAL KNEE Right 2013   TONSILLECTOMY     TOTAL HIP ARTHROPLASTY Right  10/09/2023   Procedure: ARTHROPLASTY, HIP, TOTAL, ANTERIOR APPROACH;  Surgeon: Venus Ginsberg, MD;  Location: [facility] ORS;  Service: Orthopedics;  Laterality: Right;   TOTAL SHOULDER REPLACEMENT Right 2012    Allergies  Allergen Reactions   Amoxicillin Hives, Shortness Of Breath and Swelling    Has tolerated a 1st generation cephalosporin (CEFAZOLIN) in the past without documented ADRs.    Aspirin Anaphylaxis and Shortness Of Breath   Bacitracin-Polymyx-Neo-Hc [Bacitra-Neomycin-Polymyxin-Hc] Rash   Ciprofloxacin Rash   Ciprofloxacin-Dexamethasone  Anaphylaxis   Crestor  [Rosuvastatin ] Other (See Comments)    Caused problems with memory.   Erythromycin Hives and Rash   Hydrochlorothiazide Other (See Comments) and Palpitations    Raises her blood pressure.    Ibuprofen Other (See Comments)    Sharp stomach pains   Morphine  And Codeine Nausea And Vomiting   Morpholine Salicylate Nausea Only   Omeprazole Palpitations and Other (See Comments)    Tachycardia and same sensation as her SVTs per patient   Doxycycline Hyclate Hives   Keflex [Cephalexin] Hives    Has tolerated a 1st generation cephalosporin (CEFAZOLIN) in the past without documented ADRs.  Lovastatin Other (See Comments)    "Unable to function"   Simvastatin Other (See Comments)    "Unable to function"   Silicone Dermatitis and Hives   Neomycin-Bacitracin Zn-Polymyx Hives and Rash   Tape Hives and Rash    Outpatient Encounter Medications as of 11/02/2023  Medication Sig   acetaminophen  (TYLENOL ) 325 MG tablet Take 1-2 tablets (325-650 mg total) by mouth every 6 (six) hours as needed for mild pain (pain score 1-3) (or temp > 100.5).   B Complex-C (B-COMPLEX WITH VITAMIN C) tablet Take 1 tablet by mouth daily.    calcium  carbonate (OS-CAL - DOSED IN MG OF ELEMENTAL CALCIUM ) 1250 (500 Ca) MG tablet Take 1 tablet by mouth daily with breakfast.   carvedilol  (COREG ) 6.25 MG tablet Take 1 tablet (6.25 mg total) by mouth 2 (two)  times daily with a meal.   cholecalciferol  (VITAMIN D ) 25 MCG (1000 UT) tablet Take 1,000 Units by mouth at bedtime.   Cranberry 1000 MG CAPS Take 1,000 mg by mouth daily.    ezetimibe  (ZETIA ) 10 MG tablet Take 1 tablet (10 mg total) by mouth daily.   fluticasone  (FLOVENT  HFA) 110 MCG/ACT inhaler Inhale 1 puff into the lungs every 12 (twelve) hours as needed.   gabapentin  (NEURONTIN ) 300 MG capsule Take 300 mg by mouth 3 (three) times daily.   gabapentin  (NEURONTIN ) 300 MG capsule Take 900 mg by mouth at bedtime.   Multiple Vitamins-Minerals (MULTIVITAMIN WITH MINERALS) tablet Take 1 tablet by mouth daily. Centrum Silver   ondansetron  (ZOFRAN ) 4 MG tablet Take 1 tablet (4 mg total) by mouth every 6 (six) hours as needed for nausea.   sacubitril -valsartan  (ENTRESTO ) 24-26 MG TAKE 1 TABLET BY MOUTH TWICE A DAY   spironolactone  (ALDACTONE ) 25 MG tablet TAKE 1 TABLET (25 MG TOTAL) BY MOUTH DAILY.   traMADol  (ULTRAM ) 50 MG tablet Take 1 tablet (50 mg total) by mouth every 6 (six) hours as needed for moderate pain (pain score 4-6).   Turmeric 500 MG CAPS Take 500 mg by mouth daily.    azithromycin  (ZITHROMAX ) 500 MG tablet Take 500 mg by mouth at bedtime. (Patient not taking: Reported on 11/02/2023)   docusate sodium  (COLACE) 100 MG capsule Take 1 capsule (100 mg total) by mouth 2 (two) times daily. (Patient not taking: Reported on 11/02/2023)   enoxaparin  (LOVENOX ) 40 MG/0.4ML injection Inject 0.4 mLs (40 mg total) into the skin daily for 14 days.   loratadine (CLARITIN) 10 MG tablet Take 10 mg by mouth daily as needed for allergies. (Patient not taking: Reported on 10/20/2023)   tiZANidine (ZANAFLEX) 2 MG tablet Take 1 tablet by mouth every 8 (eight) hours as needed. (Patient not taking: Reported on 10/16/2023)   tuberculin 5 UNIT/0.1ML injection Inject 0.1 mLs into the skin as directed. One time only for protocol until 10/23/2023. (Patient not taking: Reported on 11/02/2023)   zinc  sulfate 220 (50 Zn) MG  capsule Take 1 capsule (220 mg total) by mouth daily. (Patient not taking: Reported on 10/20/2023)   No facility-administered encounter medications on file as of 11/02/2023.    Review of Systems  Constitutional:  Negative for appetite change, chills, fatigue and fever.  HENT:  Negative for congestion, hearing loss, rhinorrhea and sore throat.   Eyes: Negative.   Respiratory:  Negative for cough, shortness of breath and wheezing.   Cardiovascular:  Negative for chest pain, palpitations and leg swelling.  Gastrointestinal:  Negative for abdominal pain, constipation, diarrhea, nausea and vomiting.  Genitourinary:  Negative for dysuria.  Musculoskeletal:  Negative for arthralgias, back pain and myalgias.  Skin:  Negative for color change, rash and wound.  Neurological:  Negative for dizziness, weakness and headaches.  Psychiatric/Behavioral:  Negative for behavioral problems. The patient is not nervous/anxious.    ***    Immunization History  Administered Date(s) Administered   Pneumococcal Conjugate-13 07/22/2015   Pneumococcal Polysaccharide-23 07/22/2013   Td 03/03/2002   Tdap 06/08/2022   Pertinent  Health Maintenance Due  Topic Date Due   MAMMOGRAM  12/27/2023   INFLUENZA VACCINE  12/29/2023   Colonoscopy  05/03/2024   DEXA SCAN  Completed      03/28/2022   10:39 AM 04/20/2022   10:06 AM 06/24/2022    8:12 AM 04/24/2023    4:06 PM 07/06/2023    8:29 AM  Fall Risk  Falls in the past year? 1 1 1  0 0  Was there an injury with Fall? 1 1 1  0 0  Fall Risk Category Calculator 3 3 2  0 0  Fall Risk Category (Retired) [employer]     (RETIRED) Patient Fall Risk Level Moderate fall risk High fall risk     Patient at Risk for Falls Due to History of fall(s) History of fall(s) History of fall(s) Impaired balance/gait;Impaired mobility No Fall Risks  Fall risk Follow up Falls evaluation completed Falls evaluation completed Falls evaluation completed Falls prevention discussed;Education  provided;Falls evaluation completed Falls evaluation completed     Vitals:   11/02/23 1017  BP: 109/60  Pulse: (!) 103  Resp: 20  Temp: 97.6 F (36.4 C)  SpO2: 96%  Weight: 200 lb 12.8 oz (91.1 kg)  Height: 5\' 2"  (1.575 m)   Body mass index is 36.73 kg/m.  Physical Exam Constitutional:      Appearance: Normal appearance.  HENT:     Head: Normocephalic and atraumatic.     Nose: Nose normal.     Mouth/Throat:     Mouth: Mucous membranes are moist.  Eyes:     Conjunctiva/sclera: Conjunctivae normal.  Cardiovascular:     Rate and Rhythm: Normal rate and regular rhythm.  Pulmonary:     Effort: Pulmonary effort is normal.     Breath sounds: Normal breath sounds.  Abdominal:     General: Bowel sounds are normal.     Palpations: Abdomen is soft.  Musculoskeletal:        General: Swelling present. Normal range of motion.     Cervical back: Normal range of motion.     Right lower leg: Edema present.     Comments: RLE 2+edema  Skin:    General: Skin is warm and dry.  Neurological:     General: No focal deficit present.     Mental Status: She is alert and oriented to person, place, and time.  Psychiatric:        Mood and Affect: Mood normal.        Behavior: Behavior normal.        Thought Content: Thought content normal.        Judgment: Judgment normal.        Labs reviewed: Recent Labs    10/10/23 0628 10/11/23 1752 10/12/23 0655 10/16/23 0000  NA 140 139 138 141  K 4.3 3.9 4.2 4.2  CL 107 108 106 105  CO2 25 23 25  27*  GLUCOSE 122* 107* 94  --   BUN 21 19 20 17   CREATININE 0.91 0.85 0.91 0.9  CALCIUM  9.3 8.5*  8.8* 9.0   Recent Labs    06/25/23 1638 10/03/23 1401 10/11/23 1752  AST 22 16 21   ALT 17 12 21   ALKPHOS 66 50 37*  BILITOT 0.8 0.9 1.0  PROT 6.1* 6.4* 5.2*  ALBUMIN 3.7 4.0 2.9*   Recent Labs    10/03/23 1401 10/10/23 0628 10/11/23 0626 10/11/23 1752 10/12/23 0655 10/16/23 0000  WBC 6.9   < > 9.7 9.7 7.1 5.8  NEUTROABS 4.3  --    --  7.0  --  3,857.00  HGB 15.3*   < > 12.4 11.4* 11.2* 11.5*  HCT 46.2*   < > 38.0 34.3* 33.2* 36  MCV 92.8   < > 92.7 91.5 92.0  --   PLT 240   < > 149* 164 155 228   < > = values in this interval not displayed.   Lab Results  Component Value Date   TSH 2.630 06/24/2022   Lab Results  Component Value Date   HGBA1C 5.1 06/25/2023   Lab Results  Component Value Date   CHOL 175 06/26/2023   HDL 59 06/26/2023   LDLCALC 96 06/26/2023   TRIG 99 06/26/2023   CHOLHDL 3.0 06/26/2023    Significant Diagnostic Results in last 30 days:  CT HEAD WO CONTRAST ( ) Result Date: 10/11/2023 CLINICAL DATA:  Confusion and meningitis suspected EXAM: CT HEAD WITHOUT CONTRAST TECHNIQUE: Contiguous axial images were obtained from the base of the skull through the vertex without intravenous contrast. RADIATION DOSE REDUCTION: This exam was performed according to the departmental dose-optimization program which includes automated exposure control, adjustment of the mA and/or kV according to patient size and/or use of iterative reconstruction technique. COMPARISON:  Brain MRI 06/25/2023 FINDINGS: Brain: No mass, hemorrhage or extra-axial collection. Left frontal encephalomalacia, unchanged. Old right cerebellar small vessel infarcts. Vascular: No hyperdense vessel or unexpected vascular calcification. Skull: The visualized skull base, calvarium and extracranial soft tissues are normal. Sinuses/Orbits: No fluid levels or advanced mucosal thickening of the visualized paranasal sinuses. No mastoid or middle ear effusion. Normal orbits. Other: None. IMPRESSION: 1. No acute intracranial abnormality. 2. Left frontal encephalomalacia, unchanged. 3. Old right cerebellar small vessel infarcts. Electronically Signed   By: Juanetta Nordmann M.D.   On: 10/11/2023 21:39   DG Chest 2 View Result Date: 10/11/2023 CLINICAL DATA:  Fever. EXAM: CHEST - 2 VIEW COMPARISON:  Radiographs 03/03/2022 and 02/24/2022. Chest CT 01/28/2020.  FINDINGS: Lower lung volumes with interval increased elevation of the right hemidiaphragm. Allowing for this, the heart size and mediastinal contours are stable. There is increased vascular congestion and bibasilar atelectasis. In addition, there is suspicion of a left perihilar infiltrate, with increased density projecting over the midthoracic spine on the lateral view. No evidence of pneumothorax or significant pleural effusion. Previous right shoulder reverse arthroplasty without acute osseous abnormality. IMPRESSION: 1. Lower lung volumes with increased vascular congestion and bibasilar atelectasis. 2. Suspected left perihilar infiltrate, suspicious for pneumonia. Followup PA and lateral chest X-ray is recommended in 4-6 weeks following appropriate therapy to ensure resolution and exclude underlying malignancy. Electronically Signed   By: Elmon Hagedorn M.D.   On: 10/11/2023 09:58   DG HIP UNILAT WITH PELVIS 2-3 VIEWS RIGHT Result Date: 10/09/2023 CLINICAL DATA:  Hip surgery EXAM: DG HIP (WITH OR WITHOUT PELVIS) 2-3V RIGHT COMPARISON:  05/30/2023 FINDINGS: Three low resolution intraoperative spot views of the right hip. Total fluoroscopy time was 18 seconds, fluoroscopic dose of 1.9155 mGy. The images demonstrate a right hip replacement with  normal alignment IMPRESSION: Intraoperative fluoroscopic assistance provided during right hip replacement. Electronically Signed   By: Esmeralda Hedge M.D.   On: 10/09/2023 16:19   DG C-Arm 1-60 Min-No Report Result Date: 10/09/2023 Fluoroscopy was utilized by the requesting physician.  No radiographic interpretation.   DG C-Arm 1-60 Min-No Report Result Date: 10/09/2023 Fluoroscopy was utilized by the requesting physician.  No radiographic interpretation.    Assessment/Plan ***   Family/ staff Communication: Discussed plan of care with resident and charge nurse  Labs/tests ordered:     Hutchinson Isenberg Medina-Vargas, DNP, MSN, FNP-BC [facility] and  Adult Medicine [phone] (Monday-Friday 8:00 a.m. - 5:00 p.m.) [phone] (after hours)

## 2023-11-03 ENCOUNTER — Other Ambulatory Visit: Payer: Self-pay | Admitting: Adult Health

## 2023-11-03 ENCOUNTER — Emergency Department
Admission: EM | Admit: 2023-11-03 | Discharge: 2023-11-03 | Disposition: A | Discharge status: Home / Self Care | Attending: Emergency Medicine | Admitting: Emergency Medicine

## 2023-11-03 ENCOUNTER — Emergency Department

## 2023-11-03 ENCOUNTER — Encounter: Payer: Self-pay | Admitting: Student

## 2023-11-03 ENCOUNTER — Other Ambulatory Visit: Payer: Self-pay

## 2023-11-03 ENCOUNTER — Non-Acute Institutional Stay (SKILLED_NURSING_FACILITY): Payer: Self-pay | Admitting: Student

## 2023-11-03 DIAGNOSIS — R4181 Age-related cognitive decline: Secondary | ICD-10-CM

## 2023-11-03 DIAGNOSIS — S0101XA Laceration without foreign body of scalp, initial encounter: Secondary | ICD-10-CM | POA: Insufficient documentation

## 2023-11-03 DIAGNOSIS — R69 Illness, unspecified: Secondary | ICD-10-CM | POA: Diagnosis not present

## 2023-11-03 DIAGNOSIS — Z743 Need for continuous supervision: Secondary | ICD-10-CM | POA: Diagnosis not present

## 2023-11-03 DIAGNOSIS — S0101XD Laceration without foreign body of scalp, subsequent encounter: Secondary | ICD-10-CM

## 2023-11-03 DIAGNOSIS — R0902 Hypoxemia: Secondary | ICD-10-CM | POA: Diagnosis not present

## 2023-11-03 DIAGNOSIS — W19XXXA Unspecified fall, initial encounter: Secondary | ICD-10-CM

## 2023-11-03 DIAGNOSIS — I639 Cerebral infarction, unspecified: Secondary | ICD-10-CM | POA: Diagnosis not present

## 2023-11-03 DIAGNOSIS — G9389 Other specified disorders of brain: Secondary | ICD-10-CM | POA: Diagnosis not present

## 2023-11-03 DIAGNOSIS — I5020 Unspecified systolic (congestive) heart failure: Secondary | ICD-10-CM

## 2023-11-03 DIAGNOSIS — W19XXXD Unspecified fall, subsequent encounter: Secondary | ICD-10-CM

## 2023-11-03 DIAGNOSIS — S0990XA Unspecified injury of head, initial encounter: Secondary | ICD-10-CM | POA: Diagnosis not present

## 2023-11-03 DIAGNOSIS — W06XXXA Fall from bed, initial encounter: Secondary | ICD-10-CM | POA: Diagnosis not present

## 2023-11-03 MED ORDER — ACETAMINOPHEN 325 MG PO TABS
650.0000 mg | ORAL_TABLET | Freq: Once | ORAL | Status: AC
  Administered 2023-11-03: 650 mg via ORAL
  Filled 2023-11-03: qty 2

## 2023-11-03 MED ORDER — TRAMADOL HCL 50 MG PO TABS
50.0000 mg | ORAL_TABLET | Freq: Four times a day (QID) | ORAL | 0 refills | Status: DC | PRN
Start: 2023-11-03 — End: 2024-01-04

## 2023-11-03 MED ORDER — ENTRESTO 24-26 MG PO TABS: ORAL_TABLET | ORAL | 0 refills | Status: DC

## 2023-11-03 MED ORDER — EZETIMIBE 10 MG PO TABS: 10.0000 mg | ORAL_TABLET | Freq: Every day | ORAL | 2 refills | Status: AC

## 2023-11-03 MED ORDER — ONDANSETRON HCL 4 MG PO TABS: 4.0000 mg | ORAL_TABLET | Freq: Four times a day (QID) | ORAL | 0 refills | Status: AC | PRN

## 2023-11-03 MED ORDER — SPIRONOLACTONE 25 MG PO TABS: 25.0000 mg | ORAL_TABLET | Freq: Every day | ORAL | 0 refills | Status: DC

## 2023-11-03 MED ORDER — GABAPENTIN 300 MG PO CAPS: 300.0000 mg | ORAL_CAPSULE | Freq: Three times a day (TID) | ORAL | 0 refills | Status: DC

## 2023-11-03 MED ORDER — CARVEDILOL 6.25 MG PO TABS: 6.2500 mg | ORAL_TABLET | Freq: Two times a day (BID) | ORAL | 0 refills | Status: DC

## 2023-11-03 MED ORDER — FLUTICASONE PROPIONATE HFA 110 MCG/ACT IN AERO
1.0000 | INHALATION_SPRAY | Freq: Two times a day (BID) | RESPIRATORY_TRACT | 0 refills | Status: DC | PRN
Start: 2023-11-03 — End: 2024-01-17

## 2023-11-03 NOTE — ED Provider Notes (Signed)
 [facility] Provider Note    Event Date/Time   First MD Initiated Contact with Patient 11/03/23 [phone]     (approximate)   History   Fall   HPI Audrey Martinez is a 78 y.o. female  presenting today for fall. Patient states she was getting out of bed this morning when she slipped and fell. She hit the top of her head with laceration. Denies neck injury, loss of consciousness. No pain anywhere else. Not on blood thinners. Last tetanus shot 2024. No other symptoms today.       Physical Exam   Triage Vital Signs: ED Triage Vitals [11/03/23 0731]  Encounter Vitals Group     BP      Systolic BP Percentile      Diastolic BP Percentile      Pulse      Resp      Temp      Temp src      SpO2      Weight      Height      Head Circumference      Peak Flow      Pain Score 3     Pain Loc      Pain Education      Exclude from Growth Chart     Most recent vital signs: Vitals:   11/03/23 0733  BP: (!) 151/70  Pulse: 96  Resp: 16  Temp: 98.3 F (36.8 C)  SpO2: 98%    I have reviewed the vital signs. General:  Awake, alert, no acute distress. Head:  Normocephalic, 6cm laceration to superior right frontal scalp. EENT:  PERRL, EOMI, Oral mucosa pink and moist, Neck is supple. No c spine TTP. Cardiovascular: Regular rate, 2+ distal pulses. Respiratory:  Normal respiratory effort, symmetrical expansion, no distress.   Extremities:  Moving all four extremities through full ROM without pain.  No TTP throughout bilateral upper or lower extremities. NO chest wall TTP. No T or L spine TTP. Neuro:  Alert and oriented.  Interacting appropriately.   Skin:  Warm, dry, no rash.   Psych: Appropriate affect.    ED Results / Procedures / Treatments   Labs (all labs ordered are listed, but only abnormal results are displayed) Labs Reviewed - No data to display   EKG    RADIOLOGY Independently interpreted CT head and C-spine with no acute traumatic  injuries aside from laceration   PROCEDURES:  Critical Care performed: No  .Laceration Repair  Date/Time: 11/03/2023 8:12 AM  Performed by: Kandee Orion, MD Authorized by: Kandee Orion, MD   Consent:    Consent obtained:  Verbal   Consent given by:  Patient   Risks, benefits, and alternatives were discussed: yes     Risks discussed:  Infection, pain, poor cosmetic result and poor wound healing   Alternatives discussed:  No treatment Universal protocol:    Patient identity confirmed:  Verbally with patient Anesthesia:    Anesthesia method:  None Laceration details:    Location:  Scalp   Scalp location:  R parietal   Length (cm):  6   Depth (mm):  3 Pre-procedure details:    Preparation:  Patient was prepped and draped in usual sterile fashion and imaging obtained to evaluate for foreign bodies Exploration:    Hemostasis achieved with:  Direct pressure   Imaging outcome: foreign body not noted   Treatment:    Area cleansed with:  Chlorhexidine    Amount  of cleaning:  Standard   Irrigation solution:  Sterile saline   Irrigation method:  Tap   Debridement:  None   Undermining:  None Skin repair:    Repair method:  Staples   Number of staples:  9 Approximation:    Approximation:  Close Repair type:    Repair type:  Simple Post-procedure details:    Dressing:  Open (no dressing)   Procedure completion:  Tolerated well, no immediate complications    MEDICATIONS ORDERED IN ED: Medications - No data to display   IMPRESSION / MDM / ASSESSMENT AND PLAN / ED COURSE  I reviewed the triage vital signs and the nursing notes.                              Differential diagnosis includes, but is not limited to, ICH, cervical spine injury, head laceration  Patient's presentation is most consistent with acute presentation with potential threat to life or bodily function.  Patient is a 79 year old female presenting today for ground-level fall with head injury.  No loss  of consciousness and not on any blood thinners.  Denies any other acute complaints at this time and well-appearing on arrival.  Does have noticeable 6 cm linear laceration to right anterior parietal scalp.  This was debrided and closed with 9 staples.  CT head and C-spine ordered.  CT head and C-spine negative for traumatic injuries outside of laceration.  Patient's tetanus shot is already up-to-date from 2024.  Otherwise safe for discharge back to her facility at this time and instructed to follow-up with a provider in 7 to 10 days for staple removal.  The patient is on the cardiac monitor to evaluate for evidence of arrhythmia and/or significant heart rate changes.     FINAL CLINICAL IMPRESSION(S) / ED DIAGNOSES   Final diagnoses:  Laceration of scalp, initial encounter  Fall, initial encounter     Rx / DC Orders   ED Discharge Orders     None        Note:  This document was prepared using Dragon voice recognition software and may include unintentional dictation errors.   Kandee Orion, MD 11/03/23 [phone]

## 2023-11-03 NOTE — ED Notes (Signed)
 [facility] called for tranport to [facility]

## 2023-11-03 NOTE — ED Triage Notes (Signed)
 BIBEMS, coming from [facility]. C/o unwittnessed fall. Hit head on floor, lac noted to head, bleeding controlled. No blood thinners, no LOC. Pt reports just slipping. DNR. GCS 15. PMH: R hip replacement. VSS

## 2023-11-03 NOTE — Progress Notes (Signed)
 Location:  Other [place].  Nursing Home Room Number: [facility] 106A Place of Service:  SNF [phone]) Provider:  Dr. Valrie Gehrig  PCP: Sheron Dixons, MD  Patient Care Team: Sheron Dixons, MD as PCP - General (Internal Medicine) Boyce Byes, MD as PCP - Electrophysiology (Cardiology) Cleve Dale, MD as Consulting Physician (Pulmonary Disease) Devorah Fonder, MD as Consulting Physician (Cardiology) Artemio Larry, MD (Dermatology) Venus Ginsberg, MD as Consulting Physician (Orthopedic Surgery) Jerlyn Moons, MD as Referring Physician (Orthopedic Surgery) [facility], [facility])  Extended Emergency Contact Information Primary Emergency Contact: Aloysius Artist  of [place] Mobile Phone: [phone] Relation: Son Secondary Emergency Contact: [name]  [place] Phone: [phone] Relation: Son  Code Status:  DNR Goals of care: Advanced Directive information    11/03/2023    7:32 AM  Advanced Directives  Does Patient Have a Medical Advance Directive? Yes  Type of Advance Directive Out of facility DNR (pink MOST or yellow form)  Does patient want to make changes to medical advance directive? Yes (ED - Information included in AVS)  Would patient like information on creating a medical advance directive? No - Patient declined     Chief Complaint  Patient presents with   Hospitalization Follow-up    ED Follow up    HPI:  Pt is a 78 y.o. female seen today for ER Follow up History of Present Illness The patient is a 78 year old who presents with a recent fall resulting in a head injury.  She experienced a fall due to losing her balance and slipping while wearing socks with rubber flowers on the bottom. She stepped on a part of the sock that did not have the rubber, causing her to slip and fall. This incident resulted in a head injury with significant bleeding. She does not recall the exact circumstances  leading to the fall but mentioned that she was getting up when it happened. She has a history of previous falls, which is a concern for her safety at home.  Her son currently lives with her, providing some level of support. She carries her phone at all times and has a CPI security system with a remote that can call 911 if she is within range, although she does not have a life alert system. She is eager to return home to her two [facility].  She was able to correctly state her full name, birthdate, and location. She also successfully completed cognitive tasks such as reciting the days of the week and months of the year backwards, and spelling the word 'world'. There is some concern about her memory regarding the fall and events leading to her current situation.  Past Medical History - History of falls  Social History - Lives alone - Has two [place] [employer] - Uses CPI security system - Wears slip-proof socks  Assessment and Plan   Past Medical History:  Diagnosis Date   Actinic keratosis    Acute renal failure (ARF) (HCC) 01/31/2020   Acute respiratory failure with hypoxia (HCC) 01/29/2020   Amaurosis fugax, right eye 12/2018   Aortic atherosclerosis (HCC)    Arthritis    Asthma    Atrial tachycardia (HCC)    a.) s/p RFCA for AVNRT in 2008 at [facility] [place]    Basal cell carcinoma 07/2016   central forehead (tx in [place] )   Bilateral pulmonary embolism (HCC) 01/28/2020   a.) in setting of SARS-CoV-2 infection (12/2019); noted BILATERALLY with (+)  associated RIGHT heart strain; Tx'd with rivaroxaban  Xarelto  20 mg daily x 6 months then PRN   Cerebellar infarct (RIGHT) 1998   Coronary artery disease    DDD (degenerative disc disease), cervical    Dyspnea    Facial laceration 06/16/2022   Date of injury: 06/08/2022  [facility] ED history below:  78 year old female presenting to the emergency department after mechanical, nonsyncopal  fall causing her to strike her face on the edge/corner of a curio cabinet and sustaining a deep and approximate 8 cm laceration to the right cheek in an irregular pattern to the angle of the right lower jaw. Wound does not proceed th   GERD (gastroesophageal reflux disease)    Herniated disc, lumbar    HFrEF (heart failure with reduced ejection fraction) (HCC)    History of 2019 novel coronavirus disease (COVID-19) 12/2019   History of bilateral cataract extraction    Hyperlipidemia    Incomplete left bundle branch block (LBBB)    Left wrist fracture    Lumbar stenosis    s/p LEFT L2-4 hemilaminectomies and partial facetectomies, foraminotomies for lateral recess and foraminal decompression of L3 and L4 nerve roots   Melanoma (HCC) 05/12/2020   Melanoma IS R post neck, excised 08/10/20   Mobitz type 1 second degree atrioventricular block    a.) evaluated by EP --> paroxysmal in nature; nocturnal epidoe felt to be vagally mediated   NICM (nonischemic cardiomyopathy) (HCC)    a. 04/2018 Echo: EF 45-50%. Anteroseptal and apical HK in some views. Mild AI/MR. Nl RV fxn; b. 05/2018 MV: mid-dist ant and antsept mild defect - ? breast atten. EF 41%. No ischemia; c. 03/2019 Echo: EF 30-35%, Gr2 DD, Trace MR, triv TR. Asc Ao ectatic dil - 38mm.    Osteoporosis    Personal history of transient ischemic attack (TIA), and cerebral infarction without residual deficits 04/22/2003   Pneumonia due to COVID-19 virus 01/26/2020   SVT (supraventricular tachycardia) (HCC)    a.2008 s/p RFCA for AVNRT; b. 9 & 02/2019 Zio x 2: 1. Avg HR 89, 3 runs NSVT (longest 7 beats), Mobitz 1. 2. Avg HR 89, NSVT x 1 (5 beats), Mobitz 1. No signif arrhythmia.   Tendinitis of right knee 07/03/2017   Past Surgical History:  Procedure Laterality Date   APPENDECTOMY     BREAST CYST ASPIRATION Left 1977   CARDIAC CATHETERIZATION  2009   [facility] in [place] [facility]   CARDIAC ELECTROPHYSIOLOGY STUDY AND ABLATION   2009   CATARACT EXTRACTION, BILATERAL  2010   COLONOSCOPY WITH PROPOFOL  N/A 03/21/2018   Procedure: COLONOSCOPY WITH Biopsy;  Surgeon: Selena Daily, MD;  Location: [facility];  Service: Endoscopy;  Laterality: N/A;   COLONOSCOPY WITH PROPOFOL  N/A 05/03/2021   Procedure: COLONOSCOPY WITH PROPOFOL ;  Surgeon: Selena Daily, MD;  Location: [facility] END[facility]COPY;  Service: Gastroenterology;  Laterality: N/A;   CORONARY ANGIOPLASTY     DARRACH'S PROCEDURE Bilateral 1977   ESOPHAGOGASTRODUODENOSCOPY  2013   gastritis; done for complaint of dysphagia   ESOPHAGOGASTRODUODENOSCOPY (EGD) WITH PROPOFOL  N/A 03/21/2018   Procedure: ESOPHAGOGASTRODUODENOSCOPY (EGD) WITH Biopsies;  Surgeon: Selena Daily, MD;  Location: [facility];  Service: Endoscopy;  Laterality: N/A;  KEEP THIS PATIENT FIRST   JOINT REPLACEMENT     LEFT HEART CATH AND CORONARY ANGIOGRAPHY Left 05/02/2019   Procedure: LEFT HEART CATH AND CORONARY ANGIOGRAPHY;  Surgeon: Devorah Fonder, MD;  Location: [facility] INVASIVE CV LAB;  Service: Cardiovascular;  Laterality: Left;   LUMBAR LAMINECTOMY  2017   Procedure: LUMBAR LAMINECTOMY (L2-L4)   LYMPH NODE REMOVAL (NECK)     MOHS SURGERY  07/2016   BCCA nose   POLYPECTOMY N/A 03/21/2018   Procedure: POLYPECTOMY;  Surgeon: Selena Daily, MD;  Location: [facility];  Service: Endoscopy;  Laterality: N/A;   REPLACEMENT TOTAL KNEE Right 2013   TONSILLECTOMY     TOTAL HIP ARTHROPLASTY Right 10/09/2023   Procedure: ARTHROPLASTY, HIP, TOTAL, ANTERIOR APPROACH;  Surgeon: Venus Ginsberg, MD;  Location: [facility] ORS;  Service: Orthopedics;  Laterality: Right;   TOTAL SHOULDER REPLACEMENT Right 2012    Allergies  Allergen Reactions   Amoxicillin Hives, Shortness Of Breath and Swelling    Has tolerated a 1st generation cephalosporin (CEFAZOLIN) in the past without documented ADRs.    Aspirin Anaphylaxis and Shortness Of Breath   Bacitracin-Polymyx-Neo-Hc  [Bacitra-Neomycin-Polymyxin-Hc] Rash   Ciprofloxacin Rash   Ciprofloxacin-Dexamethasone  Anaphylaxis   Crestor  [Rosuvastatin ] Other (See Comments)    Caused problems with memory.   Erythromycin Hives and Rash   Hydrochlorothiazide Other (See Comments) and Palpitations    Raises her blood pressure.    Ibuprofen Other (See Comments)    Sharp stomach pains   Morphine  And Codeine Nausea And Vomiting   Morpholine Salicylate Nausea Only   Omeprazole Palpitations and Other (See Comments)    Tachycardia and same sensation as her SVTs per patient   Doxycycline Hyclate Hives   Keflex [Cephalexin] Hives    Has tolerated a 1st generation cephalosporin (CEFAZOLIN) in the past without documented ADRs.     Lovastatin Other (See Comments)    "Unable to function"   Simvastatin Other (See Comments)    "Unable to function"   Silicone Dermatitis and Hives   Neomycin-Bacitracin Zn-Polymyx Hives and Rash   Tape Hives and Rash    Outpatient Encounter Medications as of 11/03/2023  Medication Sig   acetaminophen  (TYLENOL ) 325 MG tablet Take 1-2 tablets (325-650 mg total) by mouth every 6 (six) hours as needed for mild pain (pain score 1-3) (or temp > 100.5).   B Complex-C (B-COMPLEX WITH VITAMIN C) tablet Take 1 tablet by mouth daily.    calcium  carbonate (OS-CAL - DOSED IN MG OF ELEMENTAL CALCIUM ) 1250 (500 Ca) MG tablet Take 1 tablet by mouth daily with breakfast.   carvedilol  (COREG ) 6.25 MG tablet Take 1 tablet (6.25 mg total) by mouth 2 (two) times daily with a meal.   cholecalciferol  (VITAMIN D ) 25 MCG (1000 UT) tablet Take 1,000 Units by mouth at bedtime.   Cranberry 1000 MG CAPS Take 1,000 mg by mouth daily.    ezetimibe  (ZETIA ) 10 MG tablet Take 1 tablet (10 mg total) by mouth daily.   fluticasone  (FLOVENT  HFA) 110 MCG/ACT inhaler Inhale 1 puff into the lungs every 12 (twelve) hours as needed.   gabapentin  (NEURONTIN ) 300 MG capsule Take 300 mg by mouth 3 (three) times daily. Give 3 capsules by  mouth at bedtime.   loratadine (CLARITIN) 10 MG tablet Take 10 mg by mouth daily as needed for allergies.   Multiple Vitamins-Minerals (MULTIVITAMIN WITH MINERALS) tablet Take 1 tablet by mouth daily. Centrum Silver   ondansetron  (ZOFRAN ) 4 MG tablet Take 1 tablet (4 mg total) by mouth every 6 (six) hours as needed for nausea.   polyethylene glycol (MIRALAX  / GLYCOLAX ) 17 g packet Take 17 g by mouth daily as needed.   sacubitril -valsartan  (ENTRESTO ) 24-26 MG TAKE 1 TABLET BY MOUTH TWICE A DAY  spironolactone  (ALDACTONE ) 25 MG tablet TAKE 1 TABLET (25 MG TOTAL) BY MOUTH DAILY.   traMADol  (ULTRAM ) 50 MG tablet Take 1 tablet (50 mg total) by mouth every 6 (six) hours as needed for moderate pain (pain score 4-6).   Turmeric 500 MG CAPS Take 500 mg by mouth daily.    [DISCONTINUED] gabapentin  (NEURONTIN ) 300 MG capsule Take 300 mg by mouth 3 (three) times daily.   No facility-administered encounter medications on file as of 11/03/2023.    Review of Systems  Immunization History  Administered Date(s) Administered   Pneumococcal Conjugate-13 07/22/2015   Pneumococcal Polysaccharide-23 07/22/2013   Td 03/03/2002   Tdap 06/08/2022   Pertinent  Health Maintenance Due  Topic Date Due   MAMMOGRAM  12/27/2023   INFLUENZA VACCINE  12/29/2023   Colonoscopy  05/03/2024   DEXA SCAN  Completed      03/28/2022   10:39 AM 04/20/2022   10:06 AM 06/24/2022    8:12 AM 04/24/2023    4:06 PM 07/06/2023    8:29 AM  Fall Risk  Falls in the past year? 1 1 1  0 0  Was there an injury with Fall? 1 1 1  0 0  Fall Risk Category Calculator 3 3 2  0 0  Fall Risk Category (Retired) [employer]     (RETIRED) Patient Fall Risk Level Moderate fall risk High fall risk     Patient at Risk for Falls Due to History of fall(s) History of fall(s) History of fall(s) Impaired balance/gait;Impaired mobility No Fall Risks  Fall risk Follow up Falls evaluation completed Falls evaluation completed Falls evaluation completed Falls  prevention discussed;Education provided;Falls evaluation completed Falls evaluation completed   Functional Status Survey:    Vitals:   11/03/23 1338  BP: 126/76  Pulse: 100  Resp: 17  Temp: 97.6 F (36.4 C)  SpO2: 94%  Weight: 200 lb 12.8 oz (91.1 kg)  Height: 5\' 2"  (1.575 m)   Body mass index is 36.73 kg/m. Physical Exam Cardiovascular:     Rate and Rhythm: Normal rate.     Pulses: Normal pulses.  Skin:    Comments: Right frontoparietal laceration clean,dry,intact  Neurological:     Mental Status: She is alert and oriented to person, place, and time.     Labs reviewed: Recent Labs    10/10/23 0628 10/11/23 1752 10/12/23 0655 10/16/23 0000  NA 140 139 138 141  K 4.3 3.9 4.2 4.2  CL 107 108 106 105  CO2 25 23 25  27*  GLUCOSE 122* 107* 94  --   BUN 21 19 20 17   CREATININE 0.91 0.85 0.91 0.9  CALCIUM  9.3 8.5* 8.8* 9.0   Recent Labs    06/25/23 1638 10/03/23 1401 10/11/23 1752  AST 22 16 21   ALT 17 12 21   ALKPHOS 66 50 37*  BILITOT 0.8 0.9 1.0  PROT 6.1* 6.4* 5.2*  ALBUMIN 3.7 4.0 2.9*   Recent Labs    10/03/23 1401 10/10/23 0628 10/11/23 0626 10/11/23 1752 10/12/23 0655 10/16/23 0000  WBC 6.9   < > 9.7 9.7 7.1 5.8  NEUTROABS 4.3  --   --  7.0  --  3,857.00  HGB 15.3*   < > 12.4 11.4* 11.2* 11.5*  HCT 46.2*   < > 38.0 34.3* 33.2* 36  MCV 92.8   < > 92.7 91.5 92.0  --   PLT 240   < > 149* 164 155 228   < > = values in this interval not displayed.  Lab Results  Component Value Date   TSH 2.630 06/24/2022   Lab Results  Component Value Date   HGBA1C 5.1 06/25/2023   Lab Results  Component Value Date   CHOL 175 06/26/2023   HDL 59 06/26/2023   LDLCALC 96 06/26/2023   TRIG 99 06/26/2023   CHOLHDL 3.0 06/26/2023    Significant Diagnostic Results in last 30 days:  CT Head Wo Contrast Result Date: 11/03/2023 CLINICAL DATA:  Status post fall. Hit head on floor with laceration and bleeding. EXAM: CT HEAD WITHOUT CONTRAST CT CERVICAL SPINE  WITHOUT CONTRAST TECHNIQUE: Multidetector CT imaging of the head and cervical spine was performed following the standard protocol without intravenous contrast. Multiplanar CT image reconstructions of the cervical spine were also generated. RADIATION DOSE REDUCTION: This exam was performed according to the departmental dose-optimization program which includes automated exposure control, adjustment of the mA and/or kV according to patient size and/or use of iterative reconstruction technique. COMPARISON:  10/11/2023 FINDINGS: CT HEAD FINDINGS Brain: No evidence of acute infarction, hemorrhage, hydrocephalus, extra-axial collection or mass lesion/mass effect. Left frontal lobe encephalomalacia. Remote right cerebellar hemisphere lacunar infarcts. Vascular: No hyperdense vessel or unexpected calcification. Skull: Normal. Negative for fracture or focal lesion. Sinuses/Orbits: Paranasal sinuses and mastoid air cells are clear. Other: Right frontal scalp laceration. CT CERVICAL SPINE FINDINGS Alignment: Normal. Skull base and vertebrae: No acute fracture. No primary bone lesion or focal pathologic process. Soft tissues and spinal canal: No prevertebral fluid or swelling. No visible canal hematoma. Disc levels: Multilevel disc space narrowing and endplate spurring identified at C5-6, C6-7 and C7-T1. Upper chest: Negative. Other: None IMPRESSION: 1. No acute intracranial abnormalities. 2. Right frontal scalp laceration. 3. No evidence for cervical spine fracture or subluxation. 4. Cervical degenerative disc disease. Electronically Signed   By: Kimberley Penman M.D.   On: 11/03/2023 08:23   CT Cervical Spine Wo Contrast Result Date: 11/03/2023 CLINICAL DATA:  Status post fall. Hit head on floor with laceration and bleeding. EXAM: CT HEAD WITHOUT CONTRAST CT CERVICAL SPINE WITHOUT CONTRAST TECHNIQUE: Multidetector CT imaging of the head and cervical spine was performed following the standard protocol without intravenous  contrast. Multiplanar CT image reconstructions of the cervical spine were also generated. RADIATION DOSE REDUCTION: This exam was performed according to the departmental dose-optimization program which includes automated exposure control, adjustment of the mA and/or kV according to patient size and/or use of iterative reconstruction technique. COMPARISON:  10/11/2023 FINDINGS: CT HEAD FINDINGS Brain: No evidence of acute infarction, hemorrhage, hydrocephalus, extra-axial collection or mass lesion/mass effect. Left frontal lobe encephalomalacia. Remote right cerebellar hemisphere lacunar infarcts. Vascular: No hyperdense vessel or unexpected calcification. Skull: Normal. Negative for fracture or focal lesion. Sinuses/Orbits: Paranasal sinuses and mastoid air cells are clear. Other: Right frontal scalp laceration. CT CERVICAL SPINE FINDINGS Alignment: Normal. Skull base and vertebrae: No acute fracture. No primary bone lesion or focal pathologic process. Soft tissues and spinal canal: No prevertebral fluid or swelling. No visible canal hematoma. Disc levels: Multilevel disc space narrowing and endplate spurring identified at C5-6, C6-7 and C7-T1. Upper chest: Negative. Other: None IMPRESSION: 1. No acute intracranial abnormalities. 2. Right frontal scalp laceration. 3. No evidence for cervical spine fracture or subluxation. 4. Cervical degenerative disc disease. Electronically Signed   By: Kimberley Penman M.D.   On: 11/03/2023 08:23   CT HEAD WO CONTRAST ( ) Result Date: 10/11/2023 CLINICAL DATA:  Confusion and meningitis suspected EXAM: CT HEAD WITHOUT CONTRAST TECHNIQUE: Contiguous axial images were obtained from  the base of the skull through the vertex without intravenous contrast. RADIATION DOSE REDUCTION: This exam was performed according to the departmental dose-optimization program which includes automated exposure control, adjustment of the mA and/or kV according to patient size and/or use of iterative  reconstruction technique. COMPARISON:  Brain MRI 06/25/2023 FINDINGS: Brain: No mass, hemorrhage or extra-axial collection. Left frontal encephalomalacia, unchanged. Old right cerebellar small vessel infarcts. Vascular: No hyperdense vessel or unexpected vascular calcification. Skull: The visualized skull base, calvarium and extracranial soft tissues are normal. Sinuses/Orbits: No fluid levels or advanced mucosal thickening of the visualized paranasal sinuses. No mastoid or middle ear effusion. Normal orbits. Other: None. IMPRESSION: 1. No acute intracranial abnormality. 2. Left frontal encephalomalacia, unchanged. 3. Old right cerebellar small vessel infarcts. Electronically Signed   By: Juanetta Nordmann M.D.   On: 10/11/2023 21:39   DG Chest 2 View Result Date: 10/11/2023 CLINICAL DATA:  Fever. EXAM: CHEST - 2 VIEW COMPARISON:  Radiographs 03/03/2022 and 02/24/2022. Chest CT 01/28/2020. FINDINGS: Lower lung volumes with interval increased elevation of the right hemidiaphragm. Allowing for this, the heart size and mediastinal contours are stable. There is increased vascular congestion and bibasilar atelectasis. In addition, there is suspicion of a left perihilar infiltrate, with increased density projecting over the midthoracic spine on the lateral view. No evidence of pneumothorax or significant pleural effusion. Previous right shoulder reverse arthroplasty without acute osseous abnormality. IMPRESSION: 1. Lower lung volumes with increased vascular congestion and bibasilar atelectasis. 2. Suspected left perihilar infiltrate, suspicious for pneumonia. Followup PA and lateral chest X-ray is recommended in 4-6 weeks following appropriate therapy to ensure resolution and exclude underlying malignancy. Electronically Signed   By: Elmon Hagedorn M.D.   On: 10/11/2023 09:58   DG HIP UNILAT WITH PELVIS 2-3 VIEWS RIGHT Result Date: 10/09/2023 CLINICAL DATA:  Hip surgery EXAM: DG HIP (WITH OR WITHOUT PELVIS) 2-3V RIGHT  COMPARISON:  05/30/2023 FINDINGS: Three low resolution intraoperative spot views of the right hip. Total fluoroscopy time was 18 seconds, fluoroscopic dose of 1.9155 mGy. The images demonstrate a right hip replacement with normal alignment IMPRESSION: Intraoperative fluoroscopic assistance provided during right hip replacement. Electronically Signed   By: Esmeralda Hedge M.D.   On: 10/09/2023 16:19   DG C-Arm 1-60 Min-No Report Result Date: 10/09/2023 Fluoroscopy was utilized by the requesting physician.  No radiographic interpretation.   DG C-Arm 1-60 Min-No Report Result Date: 10/09/2023 Fluoroscopy was utilized by the requesting physician.  No radiographic interpretation.    Assessment/Plan Falls Mollie Anger has experienced falls, with the latest incident due to slipping on socks with inadequate grip. Her son will provide assistance by living with her, which is crucial given her fall risk. She plans to avoid wearing the socks that contributed to her fall and will use shoes with better grip. - Recommend use of shoes with better grip instead of socks with inadequate grip. - Advise obtaining a life alert system for emergency assistance in case of falls. - Ensure son continues to live with her for additional support and safety.  Head Laceration Wound is has 9 staples to be removed in 7-10 days. Appears clean, dry, and intact at this time.   Age related cognitive decline Patient has had continued memory changes and despite weeks in the facility is alert and oriented, but has signiticant challenges remembering events. Poor safety awareness. Family to live with her at the time of discharge, however, she is at high risk of readmission.   Family/ staff Communication: nursing  Labs/tests  ordered:  none

## 2023-11-03 NOTE — Discharge Instructions (Signed)
 CT imaging of your head and neck was negative today for any traumatic injuries.  You have 9 staples to the laceration of your scalp from the fall.  These will need to be removed in approximately 7 to 10 days.  You can gently have soap and water  run over the area but do not vigorously scrub as to dislodge the staples and reopen the wound.  If the wound reopens or you notice any pus drainage, please be evaluated by a medical provider.  Otherwise your primary care provider, facility, urgent care, or ED can remove the staples in 7 to 10 days.

## 2023-11-03 NOTE — Progress Notes (Signed)
   11/03/23 1000  Spiritual Encounters  Type of Visit Initial  Care provided to: Patient  Referral source Chaplain assessment  Reason for visit Routine spiritual support  Spiritual Framework  Presenting Themes Meaning/purpose/sources of inspiration;Values and beliefs;Impactful experiences and emotions  Interventions  Spiritual Care Interventions Made Established relationship of care and support;Compassionate presence;Reflective listening  Intervention Outcomes  Outcomes Connection to spiritual care;Awareness around self/spiritual resourses  Spiritual Care Plan  Spiritual Care Issues Still Outstanding No further spiritual care needs at this time (see row info)

## 2023-11-05 ENCOUNTER — Encounter: Payer: Self-pay | Admitting: Student

## 2023-11-06 ENCOUNTER — Other Ambulatory Visit: Payer: Self-pay | Admitting: *Deleted

## 2023-11-06 DIAGNOSIS — I10 Essential (primary) hypertension: Secondary | ICD-10-CM

## 2023-11-06 NOTE — Patient Outreach (Signed)
 Post-Acute Care Manager follow up. Audrey Martinez previously utilized [facility] SNF waiver for admission to [facility].   Confirmed with Robert Chimes Admissions Director, Audrey Martinez returned home on Saturday, 11/04/23 with [facility].   Will refer to [facility] RN CM team. Audrey Martinez has history of recent fall, HTN, HF, HLD, AFLUTTER, CAD, CKD, TIA, recent s/p total hip arthroplasty.  Nolberto Batty, MSN, RN, BSN   [facility] RN Post- Acute Care Manager Direct Dial: [phone]

## 2023-11-06 NOTE — Telephone Encounter (Signed)
 Patient in [facility] SN[facility] Forwarded to Provider.

## 2023-11-07 ENCOUNTER — Telehealth: Payer: Self-pay

## 2023-11-07 NOTE — Progress Notes (Signed)
 Complex Care Management Note  Care Guide Note 11/07/2023 Name: Audrey Martinez MRN: [id number] DOB: 28-Mar-1946  [name] is a 78 y.o. year old female who sees Sheron Dixons, MD for primary care. I reached out to [facility] by phone today to offer complex care management services.  [name] was given information about Complex Care Management services today including:   The Complex Care Management services include support from the care team which includes your Nurse Care Manager, Clinical Social Worker, or Pharmacist.  The Complex Care Management team is here to help remove barriers to the health concerns and goals most important to you. Complex Care Management services are voluntary, and the patient may decline or stop services at any time by request to their care team member.   Complex Care Management Consent Status: Patient agreed to services and verbal consent obtained.   Follow up plan:  Telephone appointment with complex care management team member scheduled for:  11/17/2023  Encounter Outcome:  Patient Scheduled  Lenton Rail , RMA     [place]  [facility], [facility] Guide  Direct Dial: [phone]  Website: [email or web address]

## 2023-11-07 NOTE — Progress Notes (Signed)
 Complex Care Management Note Care Guide Note  11/07/2023 Name: Tamiya Colello MRN: [id number] DOB: Sep 28, 1945   Complex Care Management Outreach Attempts: An unsuccessful telephone outreach was attempted today to offer the patient information about available complex care management services.  Follow Up Plan:  Additional outreach attempts will be made to offer the patient complex care management information and services.   Encounter Outcome:  No Answer  Lenton Rail , RMA     [place]  [facility], [facility] Guide  Direct Dial: [phone]  Website: [email or web address]

## 2023-11-08 ENCOUNTER — Ambulatory Visit: Admitting: Internal Medicine

## 2023-11-10 ENCOUNTER — Ambulatory Visit (INDEPENDENT_AMBULATORY_CARE_PROVIDER_SITE_OTHER): Admitting: Internal Medicine

## 2023-11-10 ENCOUNTER — Inpatient Hospital Stay: Admitting: Internal Medicine

## 2023-11-10 ENCOUNTER — Encounter: Payer: Self-pay | Admitting: Internal Medicine

## 2023-11-10 VITALS — BP 110/72 | HR 91 | Ht 62.0 in | Wt 199.2 lb

## 2023-11-10 DIAGNOSIS — S0101XD Laceration without foreign body of scalp, subsequent encounter: Secondary | ICD-10-CM

## 2023-11-10 DIAGNOSIS — R4181 Age-related cognitive decline: Secondary | ICD-10-CM

## 2023-11-10 DIAGNOSIS — Z96641 Presence of right artificial hip joint: Secondary | ICD-10-CM | POA: Diagnosis not present

## 2023-11-10 DIAGNOSIS — Z4802 Encounter for removal of sutures: Secondary | ICD-10-CM | POA: Diagnosis not present

## 2023-11-10 NOTE — Assessment & Plan Note (Signed)
 Audrey Martinez is concerned about her short term memory impairment. There is a family hx of Alzheimers. Will reassess in one month to get her out from her recent hip surgery and recent fall Consider Neurology referral - she is amenable to this

## 2023-11-10 NOTE — Assessment & Plan Note (Signed)
 Ambulating with a walker. She needs to ask Ortho about dental prophy

## 2023-11-10 NOTE — Progress Notes (Signed)
 Date:  11/10/2023   Name:  Audrey Martinez   DOB:  [birth year]   MRN:  [id number]   Chief Complaint: Hospitalization Follow-up ED follow up from 11/03/23. Fall with laceration to the head. CT neck and head were normal for age. 9 staples were placed. Tdap 05/2022.  Hip Pain  Incident onset: recent fall and THA in May. The pain is present in the right hip. Treatments tried: surgery done and she is now walking with a walker.  Fall with scalp laceration and facial ecchymosis - laceration healed, facial bruise is fading.  Minimal pain on presentation.  Review of Systems  Constitutional:  Negative for chills, fatigue and fever.  Respiratory:  Negative for chest tightness and shortness of breath.   Cardiovascular:  Negative for chest pain and palpitations.  Musculoskeletal:  Positive for arthralgias and gait problem.  Neurological:  Negative for dizziness, light-headedness and headaches.  Psychiatric/Behavioral:  Positive for decreased concentration. Negative for hallucinations and sleep disturbance. The patient is not nervous/anxious.      Lab Results  Component Value Date   NA 141 10/16/2023   K 4.2 10/16/2023   CO2 27 (A) 10/16/2023   GLUCOSE 94 10/12/2023   BUN 17 10/16/2023   CREATININE 0.9 10/16/2023   CALCIUM  9.0 10/16/2023   EGFR 70 10/16/2023   GFRNONAA >60 10/12/2023   Lab Results  Component Value Date   CHOL 175 06/26/2023   HDL 59 06/26/2023   LDLCALC 96 06/26/2023   TRIG 99 06/26/2023   CHOLHDL 3.0 06/26/2023   Lab Results  Component Value Date   TSH 2.630 06/24/2022   Lab Results  Component Value Date   HGBA1C 5.1 06/25/2023   Lab Results  Component Value Date   WBC 5.8 10/16/2023   HGB 11.5 (A) 10/16/2023   HCT 36 10/16/2023   MCV 92.0 10/12/2023   PLT 228 10/16/2023   Lab Results  Component Value Date   ALT 21 10/11/2023   AST 21 10/11/2023   ALKPHOS 37 (L) 10/11/2023   BILITOT 1.0 10/11/2023   Lab Results  Component Value Date   VD25OH  34.76 11/25/2019     Patient Active Problem List   Diagnosis Date Noted   Obesity, morbid (HCC) 10/16/2023   Headache 10/11/2023   Fever 10/11/2023   S/P total right hip arthroplasty 10/09/2023   Problem related to living alone 08/08/2023   TIA (transient ischemic attack) 06/25/2023   CAD (coronary artery disease) 06/25/2023   Chronic kidney disease, stage 3a (HCC) 06/25/2023   Age-related cognitive decline 06/15/2023   Encounter for long-term (current) use of non-steroidal anti-inflammatories 06/15/2023   Personal history of PE (pulmonary embolism) 06/15/2023   Cause of injury, accidental fall, subsequent encounter 06/15/2023   Problem related to health literacy 06/15/2023   Personal history of fall 06/15/2023   Osteoarthritis of right hip joint due to dysplasia 03/14/2023   Osteoarthritis of right hip 03/14/2023   Constipation 12/16/2022   HFrEF (heart failure with reduced ejection fraction) (HCC) 06/24/2022   Melanoma in situ of neck (HCC) 06/22/2021   Trochanteric bursitis of right hip 05/18/2021   Hx of colonic polyps    Atrial flutter (HCC) 01/31/2020   Obesity (BMI 30-39.9) 01/31/2020   Left lower lobe pulmonary nodule 01/29/2020   Dilated cardiomyopathy (HCC) 05/02/2019   Amaurosis fugax of right eye 01/13/2019   Lumbar radiculopathy 05/18/2017   Lumbar degenerative disc disease 05/18/2017   Spinal stenosis of lumbar region without neurogenic claudication 05/18/2017  Atrial tachycardia (HCC) 04/14/2017   SVT (supraventricular tachycardia) (HCC) 04/14/2017   Essential hypertension 04/14/2017   Intermittent asthma without complication 04/14/2017   Osteoporosis 03/21/2017   Benign essential tremor 03/21/2017   Closed fracture of proximal end of left fibula 03/10/2015   Pain of right hip joint 12/10/2013   History of arthroscopy of shoulder 09/14/2011   Hx of atrioventricular node ablation 02/01/2008   Controlled mild persistent asthma 06/04/2004   Mixed  hyperlipidemia 05/12/2003    Allergies  Allergen Reactions   Amoxicillin Hives, Shortness Of Breath and Swelling    Has tolerated a 1st generation cephalosporin (CEFAZOLIN) in the past without documented ADRs.    Aspirin Anaphylaxis and Shortness Of Breath   Bacitracin-Polymyx-Neo-Hc [Bacitra-Neomycin-Polymyxin-Hc] Rash   Ciprofloxacin Rash   Ciprofloxacin-Dexamethasone  Anaphylaxis   Crestor  [Rosuvastatin ] Other (See Comments)    Caused problems with memory.   Erythromycin Hives and Rash   Hydrochlorothiazide Other (See Comments) and Palpitations    Raises her blood pressure.    Ibuprofen Other (See Comments)    Sharp stomach pains   Morphine  And Codeine Nausea And Vomiting   Morpholine Salicylate Nausea Only   Omeprazole Palpitations and Other (See Comments)    Tachycardia and same sensation as her SVTs per patient   Doxycycline Hyclate Hives   Keflex [Cephalexin] Hives    Has tolerated a 1st generation cephalosporin (CEFAZOLIN) in the past without documented ADRs.     Lovastatin Other (See Comments)    Unable to function   Simvastatin Other (See Comments)    Unable to function   Bacitracin    Dexamethasone     Silicone Dermatitis and Hives   Neomycin-Bacitracin Zn-Polymyx Hives and Rash   Tape Hives and Rash    Past Surgical History:  Procedure Laterality Date   APPENDECTOMY     BREAST CYST ASPIRATION Left 1977   CARDIAC CATHETERIZATION  2009   [facility] [place] [facility]   CARDIAC ELECTROPHYSIOLOGY STUDY AND ABLATION  2009   CATARACT EXTRACTION, BILATERAL  2010   COLONOSCOPY WITH PROPOFOL  N/A 03/21/2018   Procedure: COLONOSCOPY WITH Biopsy;  Surgeon: Selena Daily, MD;  Location: [facility];  Service: Endoscopy;  Laterality: N/A;   COLONOSCOPY WITH PROPOFOL  N/A 05/03/2021   Procedure: COLONOSCOPY WITH PROPOFOL ;  Surgeon: Selena Daily, MD;  Location: [facility] ENDOSCOPY;  Service: Gastroenterology;  Laterality: N/A;   CORONARY  ANGIOPLASTY     DARRACH'S PROCEDURE Bilateral 1977   ESOPHAGOGASTRODUODENOSCOPY  2013   gastritis; done for complaint of dysphagia   ESOPHAGOGASTRODUODENOSCOPY (EGD) WITH PROPOFOL  N/A 03/21/2018   Procedure: ESOPHAGOGASTRODUODENOSCOPY (EGD) WITH Biopsies;  Surgeon: Selena Daily, MD;  Location: [facility];  Service: Endoscopy;  Laterality: N/A;  KEEP THIS PATIENT FIRST   JOINT REPLACEMENT     LEFT HEART CATH AND CORONARY ANGIOGRAPHY Left 05/02/2019   Procedure: LEFT HEART CATH AND CORONARY ANGIOGRAPHY;  Surgeon: Devorah Fonder, MD;  Location: [facility] INVASIVE CV LAB;  Service: Cardiovascular;  Laterality: Left;   LUMBAR LAMINECTOMY  2017   Procedure: LUMBAR LAMINECTOMY (L2-L4)   LYMPH NODE REMOVAL (NECK)     MOHS SURGERY  07/2016   BCCA nose   POLYPECTOMY N/A 03/21/2018   Procedure: POLYPECTOMY;  Surgeon: Selena Daily, MD;  Location: [facility];  Service: Endoscopy;  Laterality: N/A;   REPLACEMENT TOTAL KNEE Right 2013   TONSILLECTOMY     TOTAL HIP ARTHROPLASTY Right 10/09/2023   Procedure: ARTHROPLASTY, HIP, TOTAL, ANTERIOR APPROACH;  Surgeon: Venus Ginsberg, MD;  Location: [facility] ORS;  Service: Orthopedics;  Laterality: Right;   TOTAL SHOULDER REPLACEMENT Right 2012    Social History   Tobacco Use   Smoking status: Never   Smokeless tobacco: Never  Vaping Use   Vaping status: Never Used  Substance Use Topics   Alcohol use: Not Currently   Drug use: No     Medication list has been reviewed and updated.  Current Meds  Medication Sig   acetaminophen  (TYLENOL ) 325 MG tablet Take 1-2 tablets (325-650 mg total) by mouth every 6 (six) hours as needed for mild pain (pain score 1-3) (or temp > 100.5).   B Complex-C (B-COMPLEX WITH VITAMIN C) tablet Take 1 tablet by mouth daily.    calcium  carbonate (OS-CAL - DOSED IN MG OF ELEMENTAL CALCIUM ) 1250 (500 Ca) MG tablet Take 1 tablet by mouth daily with breakfast.   carvedilol  (COREG ) 6.25 MG tablet Take 1  tablet (6.25 mg total) by mouth 2 (two) times daily with a meal.   cholecalciferol  (VITAMIN D ) 25 MCG (1000 UT) tablet Take 1,000 Units by mouth at bedtime.   Cranberry 1000 MG CAPS Take 1,000 mg by mouth daily.    ezetimibe  (ZETIA ) 10 MG tablet Take 1 tablet (10 mg total) by mouth daily.   fluticasone  (FLOVENT  HFA) 110 MCG/ACT inhaler Inhale 1 puff into the lungs every 12 (twelve) hours as needed.   gabapentin  (NEURONTIN ) 300 MG capsule Take 1 capsule (300 mg total) by mouth 3 (three) times daily. Give 3 capsules by mouth at bedtime.   loratadine (CLARITIN) 10 MG tablet Take 10 mg by mouth daily as needed for allergies.   Multiple Vitamins-Minerals (MULTIVITAMIN WITH MINERALS) tablet Take 1 tablet by mouth daily. Centrum Silver   ondansetron  (ZOFRAN ) 4 MG tablet Take 1 tablet (4 mg total) by mouth every 6 (six) hours as needed for nausea.   polyethylene glycol (MIRALAX  / GLYCOLAX ) 17 g packet Take 17 g by mouth daily as needed.   sacubitril -valsartan  (ENTRESTO ) 24-26 MG TAKE 1 TABLET BY MOUTH TWICE A DAY   spironolactone  (ALDACTONE ) 25 MG tablet Take 1 tablet (25 mg total) by mouth daily.   traMADol  (ULTRAM ) 50 MG tablet Take 1 tablet (50 mg total) by mouth every 6 (six) hours as needed for moderate pain (pain score 4-6).   Turmeric 500 MG CAPS Take 500 mg by mouth daily.        07/06/2023    8:30 AM 06/24/2022    8:12 AM 03/28/2022   10:39 AM 06/22/2021    8:25 AM  GAD 7 : Generalized Anxiety Score  Nervous, Anxious, on Edge 0 0 0 0  Control/stop worrying 0 0 0 0  Worry too much - different things 0 0 0 0  Trouble relaxing 0 0 0 0  Restless 0 0 0 0  Easily annoyed or irritable 0 0 0 0  Afraid - awful might happen 0 0 0 0  Total GAD 7 Score 0 0 0 0  Anxiety Difficulty Not difficult at all Not difficult at all Not difficult at all Not difficult at all       11/10/2023    4:15 PM 07/06/2023    8:30 AM 04/24/2023    4:02 PM  Depression screen PHQ 2/9  Decreased Interest 0 0 0  Down,  Depressed, Hopeless 0 0 0  PHQ - 2 Score 0 0 0  Altered sleeping 0 0 0  Tired, decreased energy 0 0 0  Change in appetite 0 0  0  Feeling bad or failure about yourself  0 0 0  Trouble concentrating 1 0 0  Moving slowly or fidgety/restless 0 0 0  Suicidal thoughts 0  0  PHQ-9 Score 1 0 0  Difficult doing work/chores Not difficult at all Not difficult at all Not difficult at all    BP Readings from Last 3 Encounters:  11/10/23 110/72  11/03/23 126/76  11/03/23 112/65    Physical Exam Constitutional:      Appearance: Normal appearance.  HENT:     Head:      Comments: 9 staples in place - removed without difficulty.  Laceration healed without evidence of infection.  Cardiovascular:     Rate and Rhythm: Normal rate and regular rhythm.  Pulmonary:     Effort: Pulmonary effort is normal.     Breath sounds: No wheezing or rhonchi.   Musculoskeletal:     Cervical back: Normal range of motion.  Lymphadenopathy:     Cervical: No cervical adenopathy.   Neurological:     Mental Status: She is alert.     Wt Readings from Last 3 Encounters:  11/10/23 199 lb 4 oz (90.4 kg)  11/03/23 200 lb 12.8 oz (91.1 kg)  11/02/23 200 lb 12.8 oz (91.1 kg)    BP 110/72   Pulse 91   Ht 5' 2 (1.575 m)   Wt 199 lb 4 oz (90.4 kg)   SpO2 98%   BMI 36.44 kg/m   Assessment and Plan:  Problem List Items Addressed This Visit       Unprioritized   Age-related cognitive decline   Son Marietta Shorter is concerned about her short term memory impairment. There is a family hx of Alzheimers. Will reassess in one month to get her out from her recent hip surgery and recent fall Consider Neurology referral - she is amenable to this      S/P total right hip arthroplasty   Ambulating with a walker. She needs to ask Ortho about dental prophy      Other Visit Diagnoses       Laceration of scalp without foreign body, subsequent encounter    -  Primary   Healed with 9 staples in place. Tdap up to  date can shower as desired after staple removal     Encounter for removal of staples       9 staples removed without difficulty.       Return in about 4 months (around 03/11/2024).    Sheron Dixons, MD [facility] [place]

## 2023-11-13 ENCOUNTER — Inpatient Hospital Stay
Admission: EM | Admit: 2023-11-13 | Discharge: 2023-11-15 | DRG: 689 | Disposition: A | Discharge status: Home / Self Care | Attending: Internal Medicine | Admitting: Internal Medicine

## 2023-11-13 ENCOUNTER — Emergency Department

## 2023-11-13 ENCOUNTER — Other Ambulatory Visit: Payer: Self-pay

## 2023-11-13 DIAGNOSIS — I2510 Atherosclerotic heart disease of native coronary artery without angina pectoris: Secondary | ICD-10-CM | POA: Diagnosis not present

## 2023-11-13 DIAGNOSIS — Z8616 Personal history of COVID-19: Secondary | ICD-10-CM | POA: Diagnosis not present

## 2023-11-13 DIAGNOSIS — Z96651 Presence of right artificial knee joint: Secondary | ICD-10-CM | POA: Diagnosis not present

## 2023-11-13 DIAGNOSIS — N390 Urinary tract infection, site not specified: Secondary | ICD-10-CM | POA: Diagnosis present

## 2023-11-13 DIAGNOSIS — R4182 Altered mental status, unspecified: Secondary | ICD-10-CM | POA: Diagnosis not present

## 2023-11-13 DIAGNOSIS — Z833 Family history of diabetes mellitus: Secondary | ICD-10-CM | POA: Diagnosis not present

## 2023-11-13 DIAGNOSIS — Z79899 Other long term (current) drug therapy: Secondary | ICD-10-CM

## 2023-11-13 DIAGNOSIS — Z9181 History of falling: Secondary | ICD-10-CM

## 2023-11-13 DIAGNOSIS — G9341 Metabolic encephalopathy: Secondary | ICD-10-CM | POA: Diagnosis present

## 2023-11-13 DIAGNOSIS — I420 Dilated cardiomyopathy: Secondary | ICD-10-CM | POA: Diagnosis present

## 2023-11-13 DIAGNOSIS — Z91040 Latex allergy status: Secondary | ICD-10-CM

## 2023-11-13 DIAGNOSIS — I6782 Cerebral ischemia: Secondary | ICD-10-CM | POA: Diagnosis not present

## 2023-11-13 DIAGNOSIS — E782 Mixed hyperlipidemia: Secondary | ICD-10-CM | POA: Diagnosis present

## 2023-11-13 DIAGNOSIS — Z881 Allergy status to other antibiotic agents status: Secondary | ICD-10-CM

## 2023-11-13 DIAGNOSIS — N3000 Acute cystitis without hematuria: Secondary | ICD-10-CM | POA: Diagnosis not present

## 2023-11-13 DIAGNOSIS — F03A0 Unspecified dementia, mild, without behavioral disturbance, psychotic disturbance, mood disturbance, and anxiety: Secondary | ICD-10-CM | POA: Diagnosis present

## 2023-11-13 DIAGNOSIS — Z886 Allergy status to analgesic agent status: Secondary | ICD-10-CM

## 2023-11-13 DIAGNOSIS — F05 Delirium due to known physiological condition: Secondary | ICD-10-CM | POA: Diagnosis present

## 2023-11-13 DIAGNOSIS — Z832 Family history of diseases of the blood and blood-forming organs and certain disorders involving the immune mechanism: Secondary | ICD-10-CM

## 2023-11-13 DIAGNOSIS — G9340 Encephalopathy, unspecified: Secondary | ICD-10-CM | POA: Diagnosis not present

## 2023-11-13 DIAGNOSIS — Z9861 Coronary angioplasty status: Secondary | ICD-10-CM

## 2023-11-13 DIAGNOSIS — Z880 Allergy status to penicillin: Secondary | ICD-10-CM

## 2023-11-13 DIAGNOSIS — Z8249 Family history of ischemic heart disease and other diseases of the circulatory system: Secondary | ICD-10-CM

## 2023-11-13 DIAGNOSIS — Z6836 Body mass index (BMI) 36.0-36.9, adult: Secondary | ICD-10-CM

## 2023-11-13 DIAGNOSIS — Z7951 Long term (current) use of inhaled steroids: Secondary | ICD-10-CM

## 2023-11-13 DIAGNOSIS — Z1152 Encounter for screening for COVID-19: Secondary | ICD-10-CM | POA: Diagnosis not present

## 2023-11-13 DIAGNOSIS — Z96641 Presence of right artificial hip joint: Secondary | ICD-10-CM | POA: Diagnosis present

## 2023-11-13 DIAGNOSIS — Z85828 Personal history of other malignant neoplasm of skin: Secondary | ICD-10-CM

## 2023-11-13 DIAGNOSIS — Z888 Allergy status to other drugs, medicaments and biological substances status: Secondary | ICD-10-CM

## 2023-11-13 DIAGNOSIS — Z7901 Long term (current) use of anticoagulants: Secondary | ICD-10-CM | POA: Diagnosis not present

## 2023-11-13 DIAGNOSIS — E66812 Obesity, class 2: Secondary | ICD-10-CM | POA: Diagnosis not present

## 2023-11-13 DIAGNOSIS — Z823 Family history of stroke: Secondary | ICD-10-CM

## 2023-11-13 DIAGNOSIS — Z8701 Personal history of pneumonia (recurrent): Secondary | ICD-10-CM

## 2023-11-13 DIAGNOSIS — R410 Disorientation, unspecified: Principal | ICD-10-CM

## 2023-11-13 DIAGNOSIS — I10 Essential (primary) hypertension: Secondary | ICD-10-CM | POA: Diagnosis present

## 2023-11-13 DIAGNOSIS — Z85820 Personal history of malignant melanoma of skin: Secondary | ICD-10-CM | POA: Diagnosis not present

## 2023-11-13 DIAGNOSIS — Z96611 Presence of right artificial shoulder joint: Secondary | ICD-10-CM | POA: Diagnosis present

## 2023-11-13 DIAGNOSIS — M810 Age-related osteoporosis without current pathological fracture: Secondary | ICD-10-CM | POA: Diagnosis present

## 2023-11-13 DIAGNOSIS — I5032 Chronic diastolic (congestive) heart failure: Secondary | ICD-10-CM | POA: Diagnosis present

## 2023-11-13 DIAGNOSIS — Z8673 Personal history of transient ischemic attack (TIA), and cerebral infarction without residual deficits: Secondary | ICD-10-CM

## 2023-11-13 DIAGNOSIS — I110 Hypertensive heart disease with heart failure: Secondary | ICD-10-CM | POA: Diagnosis present

## 2023-11-13 DIAGNOSIS — Z885 Allergy status to narcotic agent status: Secondary | ICD-10-CM

## 2023-11-13 DIAGNOSIS — Z86711 Personal history of pulmonary embolism: Secondary | ICD-10-CM

## 2023-11-13 DIAGNOSIS — J45909 Unspecified asthma, uncomplicated: Secondary | ICD-10-CM | POA: Diagnosis present

## 2023-11-13 DIAGNOSIS — R400 Somnolence: Secondary | ICD-10-CM | POA: Diagnosis not present

## 2023-11-13 DIAGNOSIS — R404 Transient alteration of awareness: Secondary | ICD-10-CM | POA: Diagnosis not present

## 2023-11-13 DIAGNOSIS — R000 Tachycardia, unspecified: Secondary | ICD-10-CM | POA: Diagnosis not present

## 2023-11-13 LAB — CBC
HCT: 42.1 % (ref 36.0–46.0)
Hemoglobin: 14.1 g/dL (ref 12.0–15.0)
MCH: 31 pg (ref 26.0–34.0)
MCHC: 33.5 g/dL (ref 30.0–36.0)
MCV: 92.5 fL (ref 80.0–100.0)
Platelets: 215 10*3/uL (ref 150–400)
RBC: 4.55 MIL/uL (ref 3.87–5.11)
RDW: 12.5 % (ref 11.5–15.5)
WBC: 7.3 10*3/uL (ref 4.0–10.5)
nRBC: 0 % (ref 0.0–0.2)

## 2023-11-13 LAB — URINALYSIS, ROUTINE W REFLEX MICROSCOPIC
Bilirubin Urine: NEGATIVE
Glucose, UA: NEGATIVE mg/dL
Hgb urine dipstick: NEGATIVE
Ketones, ur: 5 mg/dL — AB
Nitrite: NEGATIVE
Protein, ur: 30 mg/dL — AB
Specific Gravity, Urine: 1.031 — ABNORMAL HIGH (ref 1.005–1.030)
pH: 5 (ref 5.0–8.0)

## 2023-11-13 LAB — COMPREHENSIVE METABOLIC PANEL WITH GFR
ALT: 14 U/L (ref 0–44)
AST: 19 U/L (ref 15–41)
Albumin: 4 g/dL (ref 3.5–5.0)
Alkaline Phosphatase: 69 U/L (ref 38–126)
Anion gap: 9 (ref 5–15)
BUN: 24 mg/dL — ABNORMAL HIGH (ref 8–23)
CO2: 23 mmol/L (ref 22–32)
Calcium: 9.8 mg/dL (ref 8.9–10.3)
Chloride: 110 mmol/L (ref 98–111)
Creatinine, Ser: 0.97 mg/dL (ref 0.44–1.00)
GFR, Estimated: >60 mL/min (ref >60)
Glucose, Bld: 115 mg/dL — ABNORMAL HIGH (ref 70–99)
Potassium: 3.9 mmol/L (ref 3.5–5.1)
Sodium: 142 mmol/L (ref 135–145)
Total Bilirubin: 1.1 mg/dL (ref 0.0–1.2)
Total Protein: 6.8 g/dL (ref 6.5–8.1)

## 2023-11-13 LAB — TROPONIN I (HIGH SENSITIVITY): Troponin I (High Sensitivity): 4 ng/L (ref <18)

## 2023-11-13 MED ORDER — SODIUM CHLORIDE 0.9 % IV SOLN: 1.0000 g | Freq: Once | INTRAVENOUS | Status: DC

## 2023-11-13 MED ORDER — POLYETHYLENE GLYCOL 3350 17 G PO PACK: 17.0000 g | PACK | Freq: Every day | ORAL | Status: DC | PRN

## 2023-11-13 MED ORDER — SACUBITRIL-VALSARTAN 24-26 MG PO TABS
1.0000 | ORAL_TABLET | Freq: Two times a day (BID) | ORAL | Status: DC
  Administered 2023-11-14 – 2023-11-15 (×3): 1 via ORAL
  Filled 2023-11-13 (×3): qty 1

## 2023-11-13 MED ORDER — EZETIMIBE 10 MG PO TABS
10.0000 mg | ORAL_TABLET | Freq: Every day | ORAL | Status: DC
  Administered 2023-11-14 – 2023-11-15 (×2): 10 mg via ORAL
  Filled 2023-11-13 (×2): qty 1

## 2023-11-13 MED ORDER — ACETAMINOPHEN 650 MG RE SUPP: 650.0000 mg | Freq: Four times a day (QID) | RECTAL | Status: DC | PRN

## 2023-11-13 MED ORDER — ENOXAPARIN SODIUM 60 MG/0.6ML IJ SOSY
0.5000 mg/kg | PREFILLED_SYRINGE | INTRAMUSCULAR | Status: DC
  Administered 2023-11-14: 45 mg via SUBCUTANEOUS
  Filled 2023-11-13: qty 0.6

## 2023-11-13 MED ORDER — ONDANSETRON HCL 4 MG/2ML IJ SOLN: 4.0000 mg | Freq: Four times a day (QID) | INTRAMUSCULAR | Status: DC | PRN

## 2023-11-13 MED ORDER — SODIUM CHLORIDE 0.9 % IV SOLN: 1.0000 g | INTRAVENOUS | Status: DC

## 2023-11-13 MED ORDER — SODIUM CHLORIDE 0.9 % IV SOLN
1.0000 g | INTRAVENOUS | Status: DC
  Administered 2023-11-14 (×2): 1 g via INTRAVENOUS
  Filled 2023-11-13 (×2): qty 10

## 2023-11-13 MED ORDER — ACETAMINOPHEN 325 MG PO TABS: 650.0000 mg | ORAL_TABLET | Freq: Four times a day (QID) | ORAL | Status: DC | PRN

## 2023-11-13 MED ORDER — ONDANSETRON HCL 4 MG PO TABS
4.0000 mg | ORAL_TABLET | Freq: Four times a day (QID) | ORAL | Status: DC | PRN
Start: 2023-11-13 — End: 2023-11-15

## 2023-11-13 MED ORDER — LIDOCAINE 4 % EX CREA
TOPICAL_CREAM | Freq: Once | CUTANEOUS | Status: AC
  Administered 2023-11-13: 1 via TOPICAL
  Filled 2023-11-13: qty 5

## 2023-11-13 MED ORDER — CARVEDILOL 3.125 MG PO TABS
6.2500 mg | ORAL_TABLET | Freq: Two times a day (BID) | ORAL | Status: DC
  Administered 2023-11-14 – 2023-11-15 (×4): 6.25 mg via ORAL
  Filled 2023-11-13: qty 2
  Filled 2023-11-13: qty 1
  Filled 2023-11-13 (×2): qty 2

## 2023-11-13 NOTE — ED Triage Notes (Signed)
 Pt comes in via pov with complaints of confusion. PT's son states that she has been very altered,and sleeping a lot more than usual. [name] is concerned that the patient hasn't been herself. They worry that she is losing her cognitive ability rather quickly. Pt with no complaint of pain. Pt has a history of falls, and a hip replacement.

## 2023-11-13 NOTE — ED Notes (Signed)
 Patient complains of pain when using R arm.  States she has a reverse shoulder and it causes severe pain with BP cuff.  Restricted arm band placed per patient's request and comfort.  Attempted IV start and patient was unable to tolerate due to pain.  Pulled arm away and IV was unsuccessful.  Family requesting numbing medication prior to additional sticks.  MD made aware

## 2023-11-13 NOTE — ED Notes (Signed)
 Reviewed triage note with Dr Hendrick Locke; no further orders given at this time

## 2023-11-13 NOTE — H&P (Signed)
 History and Physical    Audrey Martinez GEX:[id number] DOB: 16-Dec-1945 DOA: 11/13/2023  DOS: the patient was seen and examined on 11/13/2023  PCP: Sheron Dixons, MD   Patient coming from: Home  I have personally briefly reviewed patient's old medical records in [facility]  Chief Complaint: Altered mental status  HPI: Audrey Martinez is a pleasant 78 y.o. female with medical history significant for CHF, HTN, asthma, GERD presented to ED for acute change in mental status.  Per family she has been more confused lately, more sleepy than usual.  States that she is not acting herself.  Patient has no complaints.  Does have a history of falls but none recently.  She does not have any blood thinners on board. Patient is alert awake and oriented.  Pleasantly confused.  She was able to tell me her name, date of birth, name of the town name of the president and year.  But she was not able to appropriately tell me the month.  She told me this is February.  Otherwise she was alert awake and oriented.  ED Course: Upon arrival to the ED, patient is found to be confused.  Workup with imaging studies were unremarkable.  Patient was afebrile.  There was no leukocytosis.  CT of the brain was negative.  Urinalysis was positive for UTI patient received ceftriaxone  and hospitalist service was consulted for evaluation for admission.  Review of Systems:  ROS  All other systems negative except as noted in the HPI.  Past Medical History:  Diagnosis Date   Actinic keratosis    Acute renal failure (ARF) (HCC) 01/31/2020   Acute respiratory failure with hypoxia (HCC) 01/29/2020   Amaurosis fugax, right eye 12/2018   Aortic atherosclerosis (HCC)    Arthritis    Asthma    Atrial tachycardia (HCC)    a.) s/p RFCA for AVNRT in 2008 at [facility] [place]    Basal cell carcinoma 07/2016   central forehead (tx in [place] )   Bilateral pulmonary embolism (HCC) 01/28/2020   a.)  in setting of SARS-CoV-2 infection (12/2019); noted BILATERALLY with (+) associated RIGHT heart strain; Tx'd with rivaroxaban  Xarelto  20 mg daily x 6 months then PRN   Cerebellar infarct (RIGHT) 1998   Coronary artery disease    DDD (degenerative disc disease), cervical    Dyspnea    Facial laceration 06/16/2022   Date of injury: 06/08/2022  [facility] ED history below:  78 year old female presenting to the emergency department after mechanical, nonsyncopal fall causing her to strike her face on the edge/corner of a curio cabinet and sustaining a deep and approximate 8 cm laceration to the right cheek in an irregular pattern to the angle of the right lower jaw. Wound does not proceed th   GERD (gastroesophageal reflux disease)    Herniated disc, lumbar    HFrEF (heart failure with reduced ejection fraction) (HCC)    History of 2019 novel coronavirus disease (COVID-19) 12/2019   History of bilateral cataract extraction    Hyperlipidemia    Incomplete left bundle branch block (LBBB)    Left wrist fracture    Lumbar stenosis    s/p LEFT L2-4 hemilaminectomies and partial facetectomies, foraminotomies for lateral recess and foraminal decompression of L3 and L4 nerve roots   Melanoma (HCC) 05/12/2020   Melanoma IS R post neck, excised 08/10/20   Mobitz type 1 second degree atrioventricular block    a.) evaluated by EP --> paroxysmal in  nature; nocturnal epidoe felt to be vagally mediated   NICM (nonischemic cardiomyopathy) (HCC)    a. 04/2018 Echo: EF 45-50%. Anteroseptal and apical HK in some views. Mild AI/MR. Nl RV fxn; b. 05/2018 MV: mid-dist ant and antsept mild defect - ? breast atten. EF 41%. No ischemia; c. 03/2019 Echo: EF 30-35%, Gr2 DD, Trace MR, triv TR. Asc Ao ectatic dil - 38mm.    Osteoporosis    Personal history of transient ischemic attack (TIA), and cerebral infarction without residual deficits 04/22/2003   Pneumonia due to COVID-19 virus 01/26/2020   SVT  (supraventricular tachycardia) (HCC)    a.2008 s/p RFCA for AVNRT; b. 9 & 02/2019 Zio x 2: 1. Avg HR 89, 3 runs NSVT (longest 7 beats), Mobitz 1. 2. Avg HR 89, NSVT x 1 (5 beats), Mobitz 1. No signif arrhythmia.   Tendinitis of right knee 07/03/2017    Past Surgical History:  Procedure Laterality Date   APPENDECTOMY     BREAST CYST ASPIRATION Left 1977   CARDIAC CATHETERIZATION  2009   [facility] in [place] --[facility]   CARDIAC ELECTROPHYSIOLOGY STUDY AND ABLATION  2009   CATARACT EXTRACTION, BILATERAL  2010   COLONOSCOPY WITH PROPOFOL  N/A 03/21/2018   Procedure: COLONOSCOPY WITH Biopsy;  Surgeon: Selena Daily, MD;  Location: [facility];  Service: Endoscopy;  Laterality: N/A;   COLONOSCOPY WITH PROPOFOL  N/A 05/03/2021   Procedure: COLONOSCOPY WITH PROPOFOL ;  Surgeon: Selena Daily, MD;  Location: [facility] ENDOSCOPY;  Service: Gastroenterology;  Laterality: N/A;   CORONARY ANGIOPLASTY     DARRACH'S PROCEDURE Bilateral 1977   ESOPHAGOGASTRODUODENOSCOPY  2013   gastritis; done for complaint of dysphagia   ESOPHAGOGASTRODUODENOSCOPY (EGD) WITH PROPOFOL  N/A 03/21/2018   Procedure: ESOPHAGOGASTRODUODENOSCOPY (EGD) WITH Biopsies;  Surgeon: Selena Daily, MD;  Location: [facility];  Service: Endoscopy;  Laterality: N/A;  KEEP THIS PATIENT FIRST   JOINT REPLACEMENT     LEFT HEART CATH AND CORONARY ANGIOGRAPHY Left 05/02/2019   Procedure: LEFT HEART CATH AND CORONARY ANGIOGRAPHY;  Surgeon: Devorah Fonder, MD;  Location: [facility] INVASIVE CV LAB;  Service: Cardiovascular;  Laterality: Left;   LUMBAR LAMINECTOMY  2017   Procedure: LUMBAR LAMINECTOMY (L2-L4)   LYMPH NODE REMOVAL (NECK)     MOHS SURGERY  07/2016   BCCA nose   POLYPECTOMY N/A 03/21/2018   Procedure: POLYPECTOMY;  Surgeon: Selena Daily, MD;  Location: [facility];  Service: Endoscopy;  Laterality: N/A;   REPLACEMENT TOTAL KNEE Right 2013   TONSILLECTOMY     TOTAL HIP  ARTHROPLASTY Right 10/09/2023   Procedure: ARTHROPLASTY, HIP, TOTAL, ANTERIOR APPROACH;  Surgeon: Venus Ginsberg, MD;  Location: [facility] ORS;  Service: Orthopedics;  Laterality: Right;   TOTAL SHOULDER REPLACEMENT Right 2012     reports that she has never smoked. She has never used smokeless tobacco. She reports that she does not currently use alcohol. She reports that she does not use drugs.  Allergies  Allergen Reactions   Amoxicillin Hives, Shortness Of Breath and Swelling    Has tolerated a 1st generation cephalosporin (CEFAZOLIN) in the past without documented ADRs.    Aspirin Anaphylaxis and Shortness Of Breath   Bacitracin-Polymyx-Neo-Hc [Bacitra-Neomycin-Polymyxin-Hc] Rash   Ciprofloxacin Rash   Ciprofloxacin-Dexamethasone  Anaphylaxis   Crestor  [Rosuvastatin ] Other (See Comments)    Caused problems with memory.   Erythromycin Hives and Rash   Hydrochlorothiazide Other (See Comments) and Palpitations    Raises her blood pressure.    Ibuprofen Other (See Comments)  Sharp stomach pains   Morphine  And Codeine Nausea And Vomiting   Morpholine Salicylate Nausea Only   Omeprazole Palpitations and Other (See Comments)    Tachycardia and same sensation as her SVTs per patient   Doxycycline Hyclate Hives   Keflex [Cephalexin] Hives    Has tolerated a 1st generation cephalosporin (CEFAZOLIN) in the past without documented ADRs.     Lovastatin Other (See Comments)    Unable to function   Simvastatin Other (See Comments)    Unable to function   Bacitracin    Dexamethasone     Silicone Dermatitis and Hives   Neomycin-Bacitracin Zn-Polymyx Hives and Rash   Tape Hives and Rash    Family History  Problem Relation Age of Onset   Diabetes Mother    Stroke Mother    Valvular heart disease Father    CAD Sister 42       died of MI   Stroke Sister    Stroke Sister 61   Clotting disorder Half-Sister    Clotting disorder Half-Sister    Breast cancer Neg Hx     Prior to  Admission medications   Medication Sig Start Date End Date Taking? Authorizing Provider  acetaminophen  (TYLENOL ) 325 MG tablet Take 1-2 tablets (325-650 mg total) by mouth every 6 (six) hours as needed for mild pain (pain score 1-3) (or temp > 100.5). 10/12/23   Gaines, Thomas C, PA-C  B Complex-C (B-COMPLEX WITH VITAMIN C) tablet Take 1 tablet by mouth daily.     [provider]  calcium  carbonate (OS-CAL - DOSED IN MG OF ELEMENTAL CALCIUM ) 1250 (500 Ca) MG tablet Take 1 tablet by mouth daily with breakfast.    [provider]  carvedilol  (COREG ) 6.25 MG tablet Take 1 tablet (6.25 mg total) by mouth 2 (two) times daily with a meal. 11/03/23   Medina-Vargas, Monina C, NP  cholecalciferol  (VITAMIN D ) 25 MCG (1000 UT) tablet Take 1,000 Units by mouth at bedtime.    [provider]  Cranberry 1000 MG CAPS Take 1,000 mg by mouth daily.     [provider]  ezetimibe  (ZETIA ) 10 MG tablet Take 1 tablet (10 mg total) by mouth daily. 11/03/23   Medina-Vargas, Monina C, NP  fluticasone  (FLOVENT  HFA) 110 MCG/ACT inhaler Inhale 1 puff into the lungs every 12 (twelve) hours as needed. 11/03/23   Medina-Vargas, Monina C, NP  gabapentin  (NEURONTIN ) 300 MG capsule Take 1 capsule (300 mg total) by mouth 3 (three) times daily. Give 3 capsules by mouth at bedtime. 11/03/23   Medina-Vargas, Monina C, NP  loratadine (CLARITIN) 10 MG tablet Take 10 mg by mouth daily as needed for allergies.    [provider]  Multiple Vitamins-Minerals (MULTIVITAMIN WITH MINERALS) tablet Take 1 tablet by mouth daily. Centrum Silver    [provider]  ondansetron  (ZOFRAN ) 4 MG tablet Take 1 tablet (4 mg total) by mouth every 6 (six) hours as needed for nausea. 11/03/23   Medina-Vargas, Monina C, NP  polyethylene glycol (MIRALAX  / GLYCOLAX ) 17 g packet Take 17 g by mouth daily as needed.    [provider]  sacubitril -valsartan  (ENTRESTO ) 24-26 MG TAKE 1 TABLET BY MOUTH TWICE A DAY  11/03/23   Medina-Vargas, Monina C, NP  spironolactone  (ALDACTONE ) 25 MG tablet Take 1 tablet (25 mg total) by mouth daily. 11/03/23   Medina-Vargas, Monina C, NP  traMADol  (ULTRAM ) 50 MG tablet Take 1 tablet (50 mg total) by mouth every 6 (six) hours as needed  for moderate pain (pain score 4-6). 11/03/23   Medina-Vargas, Monina C, NP  Turmeric 500 MG CAPS Take 500 mg by mouth daily.     [provider]    Physical Exam: Vitals:   11/13/23 1337 11/13/23 1724 11/13/23 2125 11/13/23 2130  BP:  114/84    Pulse:  94 86 85  Resp:  18 19 (!) 23  Temp:  98.5 F (36.9 C)    TempSrc:  Oral    SpO2:  98% 98% 97%  Weight: 90.4 kg     Height: 5' 2 (1.575 m)       Physical Exam  Constitutional: Alert, awake, calm, comfortable HEENT: Neck supple Respiratory: clear to auscultation bilaterally, no wheezing, no crackles. Normal respiratory effort. No accessory muscle use.  Cardiovascular: Regular rate and rhythm, no murmurs / rubs / gallops. No extremity edema. 2+ pedal pulses. No carotid bruits.  Abdomen: no tenderness, no masses palpated. No hepatosplenomegaly. Bowel sounds positive.  Musculoskeletal: no clubbing / cyanosis. No joint deformity upper and lower extremities. Good ROM, no contractures. Normal muscle tone.  Skin: no rashes, lesions, ulcers. No induration Neurologic: CN 2-12 grossly intact. Sensation intact, DTR normal. Strength 5/5 x all 4 extremities.  Psychiatric: Alert and oriented x 3. Normal mood.     Labs on Admission: I have personally reviewed following labs and imaging studies  CBC: Recent Labs  Lab 11/13/23 1341  WBC 7.3  HGB 14.1  HCT 42.1  MCV 92.5  PLT 215   Basic Metabolic Panel: Recent Labs  Lab 11/13/23 1341  NA 142  K 3.9  CL 110  CO2 23  GLUCOSE 115*  BUN 24*  CREATININE 0.97  CALCIUM  9.8   GFR: Estimated Creatinine Clearance: 52.4 mL/min (by C-G formula based on SCr of 0.97 mg/dL). Liver Function Tests: Recent Labs  Lab 11/13/23 1341   AST 19  ALT 14  ALKPHOS 69  BILITOT 1.1  PROT 6.8  ALBUMIN 4.0   No results for input(s): LIPASE, AMYLASE in the last 168 hours. No results for input(s): AMMONIA in the last 168 hours. Coagulation Profile: No results for input(s): INR, PROTIME in the last 168 hours. Cardiac Enzymes: Recent Labs  Lab 11/13/23 1341  TROPONINIHS 4   BNP (last 3 results) No results for input(s): BNP in the last 8760 hours. HbA1C: No results for input(s): HGBA1C in the last 72 hours. CBG: No results for input(s): GLUCAP in the last 168 hours. Lipid Profile: No results for input(s): CHOL, HDL, LDLCALC, TRIG, CHOLHDL, LDLDIRECT in the last 72 hours. Thyroid  Function Tests: No results for input(s): TSH, T4TOTAL, FREET4, T3FREE, THYROIDAB in the last 72 hours. Anemia Panel: No results for input(s): VITAMINB12, FOLATE, FERRITIN, TIBC, IRON, RETICCTPCT in the last 72 hours. Urine analysis:    Component Value Date/Time   COLORURINE AMBER (A) 11/13/2023 1958   APPEARANCEUR CLOUDY (A) 11/13/2023 1958   LABSPEC 1.031 (H) 11/13/2023 1958   PHURINE 5.0 11/13/2023 1958   GLUCOSEU NEGATIVE 11/13/2023 1958   HGBUR NEGATIVE 11/13/2023 1958   BILIRUBINUR NEGATIVE 11/13/2023 1958   BILIRUBINUR neg 11/25/2019 1115   KETONESUR 5 (A) 11/13/2023 1958   PROTEINUR 30 (A) 11/13/2023 1958   UROBILINOGEN 0.2 11/25/2019 1115   NITRITE NEGATIVE 11/13/2023 1958   LEUKOCYTESUR MODERATE (A) 11/13/2023 1958    Radiological Exams on Admission: I have personally reviewed images DG Chest 1 View Result Date: 11/13/2023 CLINICAL DATA:  Altered level of consciousness, somnolent EXAM: CHEST  1 VIEW COMPARISON:  10/11/2023  FINDINGS: Single frontal view of the chest demonstrates an unremarkable cardiac silhouette. No airspace disease, effusion, or pneumothorax. Stable right shoulder arthroplasty. IMPRESSION: 1. No acute intrathoracic process. Electronically Signed   By: Bobbye Burrow M.D.   On: 11/13/2023 20:20   CT Head Wo Contrast Result Date: 11/13/2023 EXAM: CT HEAD WITHOUT CONTRAST 11/13/2023 08:09:00 PM TECHNIQUE: CT of the head was performed without the administration of intravenous contrast. Automated exposure control, iterative reconstruction, and/or weight based adjustment of the mA/kV was utilized to reduce the radiation dose to as low as reasonably achievable. COMPARISON: 11/03/2023 CLINICAL HISTORY: Mental status change, unknown cause. Pt comes in via pov with complaints of confusion. PT's son states that she has been very altered,and sleeping a lot more than usual. [name] is concerned that the patient hasn't been herself. They worry that she is losing her cognitive ability rather quickly. Pt with no complaint of pain. Pt has a history of falls, and a hip replacement. FINDINGS: BRAIN AND VENTRICLES: There is no acute intracranial hemorrhage, mass effect or midline shift. No abnormal extra-axial fluid collection. The gray-white differentiation is maintained without an acute infarct. There is no hydrocephalus. Encephalopathy changes related to prior left frontal infarct. Mild subcortical and periventricular small vessel ischemic changes. ORBITS: The visualized portion of the orbits demonstrate no acute abnormality. SINUSES: The visualized paranasal sinuses and mastoid air cells demonstrate no acute abnormality. SOFT TISSUES AND SKULL: No acute abnormality of the visualized skull or soft tissues. IMPRESSION: 1. No acute intracranial abnormality. 2. Old left frontal infarct. Mild small vessel ischemic changes. Electronically signed by: Zadie Herter MD 11/13/2023 08:14 PM EDT RP Workstation: [id number]    EKG: My personal interpretation of EKG shows: N/A    Assessment/Plan Principal Problem:   UTI (urinary tract infection) Active Problems:   Mixed hyperlipidemia   Essential hypertension     78 year old female W/PMH of HTN, dilated cardiomyopathy, HLD,  dementia, osteoarthritis s/p total right hip arthroplasty who was brought in for change in mental status at home.  1.  Delirium due to urinary tract infection - Patient received antibiotics in the emergency room. - Will continue ceftriaxone  - Will follow-up cultures and adjust antibiotic as needed - It appears that she takes gabapentin  at home.  That might have contributed for confusion. - Will hold gabapentin .  2.  UTI - Treatment as above  3.  HTN - Blood pressure appears to be stable - Continue to monitor blood pressure  4.  Mild dementia - Continue supportive care      DVT prophylaxis: Lovenox  Code Status: Full Code Family Communication: None available  Disposition Plan: Home  Consults called: N/A  Admission status: Inpatient, Med-Surg   Beatris Bough, MD [facility] 11/13/2023, 10:22 PM

## 2023-11-13 NOTE — Progress Notes (Signed)
 Anticoagulation monitoring(Lovenox ):  78 yo  female ordered Lovenox  40 mg Q24h    Filed Weights   11/13/23 1337  Weight: 90.4 kg (199 lb 4.7 oz)   BMI 36.5    Lab Results  Component Value Date   CREATININE 0.97 11/13/2023   CREATININE 0.9 10/16/2023   CREATININE 0.91 10/12/2023   Estimated Creatinine Clearance: 52.4 mL/min (by C-G formula based on SCr of 0.97 mg/dL). Hemoglobin & Hematocrit     Component Value Date/Time   HGB 14.1 11/13/2023 1341   HGB 15.4 06/24/2022 0841   HCT 42.1 11/13/2023 1341   HCT 47.1 (H) 06/24/2022 8119     Per Protocol for Patient with estCrcl > 30 ml/min and BMI > 30, will transition to Lovenox  45 mg Q24h.

## 2023-11-13 NOTE — ED Provider Notes (Signed)
 [facility] Provider Note    Event Date/Time   First MD Initiated Contact with Patient 11/13/23 2112     (approximate)   History   Altered Mental Status   HPI  Audrey Martinez is a 78 y.o. female with history of CHF, hypertension, asthma, presenting with altered mental status.  Per family, she has been more confused lately, more sleepy than usual.  States that she is not acting herself.  Patient has no complaints.  Does have history of falls but none recently.  Not on any blood thinners.  Independent history obtained from family as above.  Per son patient did have a fall more than a week ago but no recent falls.  Per son he had moved in with patient, no additional help at home.  On independent review, she was admitted in May of this year, had a total hip arthroplasty done on the right.  Surgeries complicated by postop fever and altered mental status, chest x-ray showed pneumonia, was given antibiotics but discontinued after bacterial infection was ruled out.  She was stable and discharged to SNF.     Physical Exam   Triage Vital Signs: ED Triage Vitals  Encounter Vitals Group     BP 11/13/23 1336 113/77     Girls Systolic BP Percentile --      Girls Diastolic BP Percentile --      Boys Systolic BP Percentile --      Boys Diastolic BP Percentile --      Pulse Rate 11/13/23 1336 100     Resp 11/13/23 1336 17     Temp 11/13/23 1336 (!) 97.1 F (36.2 C)     Temp Source 11/13/23 1724 Oral     SpO2 11/13/23 1336 96 %     Weight 11/13/23 1337 199 lb 4.7 oz (90.4 kg)     Height 11/13/23 1337 5' 2 (1.575 m)     Head Circumference --      Peak Flow --      Pain Score 11/13/23 1337 0     Pain Loc --      Pain Education --      Exclude from Growth Chart --     Most recent vital signs: Vitals:   11/13/23 2125 11/13/23 2130  BP:    Pulse: 86 85  Resp: 19 (!) 23  Temp:    SpO2: 98% 97%     General: Awake, no distress.  ANO x 2 CV:  Good  peripheral perfusion.  Resp:  Normal effort.  Abd:  No distention.  Soft nontender Other:  Grip is intact, sensation is intact, no cranial nerve deficits, dorsi and plantarflex feet bilaterally    ED Results / Procedures / Treatments   Labs (all labs ordered are listed, but only abnormal results are displayed) Labs Reviewed  COMPREHENSIVE METABOLIC PANEL WITH GFR - Abnormal; Notable for the following components:      Result Value   Glucose, Bld 115 (*)    BUN 24 (*)    All other components within normal limits  URINALYSIS, ROUTINE W REFLEX MICROSCOPIC - Abnormal; Notable for the following components:   Color, Urine AMBER (*)    APPearance CLOUDY (*)    Specific Gravity, Urine 1.031 (*)    Ketones, ur 5 (*)    Protein, ur 30 (*)    Leukocytes,Ua MODERATE (*)    Bacteria, UA FEW (*)    All other components within normal limits  CBC  TROPONIN I (HIGH SENSITIVITY)  TROPONIN I (HIGH SENSITIVITY)     EKG  EKG shows, sinus tachycardia, rate 103, widened QRS, normal QTc, intraventricular conduction delay, no obvious ischemic ST elevation, when QRS is new compared to prior.   RADIOLOGY On my independent interpretation, CT head without obvious intracranial hemorrhage   PROCEDURES:  Critical Care performed: No  Procedures   MEDICATIONS ORDERED IN ED: Medications  cefTRIAXone  (ROCEPHIN ) 1 g in sodium chloride  0.9 % 100 mL IVPB (has no administration in time range)  lidocaine  (LMX) 4 % cream (has no administration in time range)     IMPRESSION / MDM / ASSESSMENT AND PLAN / ED COURSE  I reviewed the triage vital signs and the nursing notes.                              Differential diagnosis includes, but is not limited to, electrolyte derangements, atypical ACS, occult infection such as UTI or pneumonia, intracranial hemorrhage, dementia.  Will get labs, EKG, troponin, chest x-ray, CT head, UA.  Patient's presentation is most consistent with acute presentation with  potential threat to life or bodily function.  Independent interpretation of labs and imaging below.  UA is consistent with UTI, will start her on IV ceftriaxone , she has tolerated the antibiotic in the past.  Given the confusion, UTI, she will need be admitted for further management.  Consult to hospitalist was agreeable with the plan for admission and will evaluate the patient.  She is admitted.  Shared decision making done with son and patient and they are agreeable with this plan.    Clinical Course as of 11/13/23 2145  Mon Nov 13, 2023  2112 Independent review of labs, UA is consistent with UTI, troponin is not elevated, electrolytes not severely deranged, LFTs are normal, no leukocytosis. [TT]  2113 DG Chest 1 View 1. No acute intrathoracic process.  [TT]  2113 CT Head Wo Contrast IMPRESSION: 1. No acute intracranial abnormality. 2. Old left frontal infarct. Mild small vessel ischemic changes   [TT]    Clinical Course User Index [TT] Drenda Gentle Richard Champion, MD     FINAL CLINICAL IMPRESSION(S) / ED DIAGNOSES   Final diagnoses:  Confusion  Urinary tract infection without hematuria, site unspecified     Rx / DC Orders   ED Discharge Orders     None        Note:  This document was prepared using Dragon voice recognition software and may include unintentional dictation errors.    Shane Darling, MD 11/13/23 [phone]

## 2023-11-14 DIAGNOSIS — N3000 Acute cystitis without hematuria: Secondary | ICD-10-CM | POA: Diagnosis not present

## 2023-11-14 LAB — CBC
HCT: 39.5 % (ref 36.0–46.0)
HCT: 38.2 % (ref 36.0–46.0)
Hemoglobin: 13 g/dL (ref 12.0–15.0)
Hemoglobin: 12.6 g/dL (ref 12.0–15.0)
MCH: 30.5 pg (ref 26.0–34.0)
MCH: 30.8 pg (ref 26.0–34.0)
MCHC: 32.9 g/dL (ref 30.0–36.0)
MCHC: 33 g/dL (ref 30.0–36.0)
MCV: 92.7 fL (ref 80.0–100.0)
MCV: 93.4 fL (ref 80.0–100.0)
Platelets: 176 10*3/uL (ref 150–400)
Platelets: 177 10*3/uL (ref 150–400)
RBC: 4.26 MIL/uL (ref 3.87–5.11)
RBC: 4.09 MIL/uL (ref 3.87–5.11)
RDW: 12.6 % (ref 11.5–15.5)
RDW: 12.6 % (ref 11.5–15.5)
WBC: 7.1 10*3/uL (ref 4.0–10.5)
WBC: 6.4 10*3/uL (ref 4.0–10.5)
nRBC: 0 % (ref 0.0–0.2)
nRBC: 0 % (ref 0.0–0.2)

## 2023-11-14 LAB — BASIC METABOLIC PANEL WITH GFR
Anion gap: 9 (ref 5–15)
BUN: 27 mg/dL — ABNORMAL HIGH (ref 8–23)
CO2: 24 mmol/L (ref 22–32)
Calcium: 9.1 mg/dL (ref 8.9–10.3)
Chloride: 108 mmol/L (ref 98–111)
Creatinine, Ser: 0.94 mg/dL (ref 0.44–1.00)
GFR, Estimated: >60 mL/min (ref >60)
Glucose, Bld: 101 mg/dL — ABNORMAL HIGH (ref 70–99)
Potassium: 3.5 mmol/L (ref 3.5–5.1)
Sodium: 141 mmol/L (ref 135–145)

## 2023-11-14 LAB — CREATININE, SERUM
Creatinine, Ser: 0.98 mg/dL (ref 0.44–1.00)
GFR, Estimated: >60 mL/min (ref >60)

## 2023-11-14 LAB — TROPONIN I (HIGH SENSITIVITY): Troponin I (High Sensitivity): 3 ng/L (ref <18)

## 2023-11-14 MED ORDER — TRAMADOL HCL 50 MG PO TABS: 50.0000 mg | ORAL_TABLET | Freq: Four times a day (QID) | ORAL | Status: DC | PRN

## 2023-11-14 MED ORDER — SPIRONOLACTONE 25 MG PO TABS
25.0000 mg | ORAL_TABLET | Freq: Every day | ORAL | Status: DC
  Administered 2023-11-14 – 2023-11-15 (×2): 25 mg via ORAL
  Filled 2023-11-14 (×2): qty 1

## 2023-11-14 NOTE — Progress Notes (Signed)
 PROGRESS NOTE    Audrey Martinez   WUJ:[id number] DOB: 1945/07/03  DOA: 11/13/2023 Date of Service: 11/14/23 which is hospital day 1  PCP: Sheron Dixons, MD    Hospital course / significant events:  Audrey Martinez is a pleasant 78 y.o. female with medical history significant for CHF, HTN, asthma, GERD presented to ED for acute change in mental status.  Per family she has been more confused lately, more sleepy than usual. 06/16 to ED. (+)UTI. Admitted to hospitalist. Was never septic. PT/OT eval, does not need SNF can f/u outpatient PT. Per son, he is unable to care for her until tomorrow 06/18, dc held     Consultants:  none  Procedures/Surgeries: none      ASSESSMENT & PLAN:   UTI No sepsis Ceftriaxone  Follow cultures   Delirium due to urinary tract infection Mild dementia  Delirium precautions  Holding gabapentin  Son was educated re: risk of sundowning / hospital delirium worsening here    HTN Hx HFpEF not in acute exacerbation  Blood pressure stable Continue home beta blocker, spironolactone , zetia , Entresto   Monitor vitals         Class 2 obesity based on BMI: Body mass index is 36.45 kg/m.Aaron Aas Significantly low or high BMI is associated with higher medical risk.  Underweight - under 18  overweight - 25 to 29 obese - 30 or more Class 1 obesity: BMI of 30.0 to 34 Class 2 obesity: BMI of 35.0 to 39 Class 3 obesity: BMI of 40.0 to 49 Super Morbid Obesity: BMI 50-59 Super-super Morbid Obesity: BMI 60+ Healthy nutrition and physical activity advised as adjunct to other disease management and risk reduction treatments    DVT prophylaxis: lovenox  IV fluids: no continuous IV fluids  Nutrition: regular Central lines / other devices: none  Code Status: FULL CODE ACP documentation reviewed:  none on file in [facility]  TOC needs: TBD Medical barriers to dispo: none. Son unwilling to get her today, needs to work tomorrow, can probably get her  tomorrow              Subjective / Brief ROS:  Patient reports feeling fine Denies CP/SOB.  Pain controlled.  Denies new weakness.  Tolerating diet.  Reports no concerns w/ urination/defecation.   Family Communication: spoke onphone w/ son - let him know pt is not critically ill and can dc home on po abx, he wants her to stay bc he is worried about leaving her alone tomorrow when he is at work, worreid about confusion. I advised him she might get more confused here, and AMS common w/ UTI.     Objective Findings:  Vitals:   11/14/23 0345 11/14/23 0632 11/14/23 1222 11/14/23 1606  BP:  (!) 121/106 125/73 132/75  Pulse:  87 85 83  Resp: 19 (!) 21 17 18   Temp:  98.2 F (36.8 C) 98.6 F (37 C) 98.2 F (36.8 C)  TempSrc:  Oral  Oral  SpO2:  96% 97% 99%  Weight:      Height:        Intake/Output Summary (Last 24 hours) at 11/14/2023 1726 Last data filed at 11/14/2023 0200 Gross per 24 hour  Intake 100 ml  Output --  Net 100 ml   Filed Weights   11/13/23 1337  Weight: 90.4 kg    Examination:  Physical Exam Constitutional:      General: She is not in acute distress.    Appearance: She is not ill-appearing.  Cardiovascular:     Rate and Rhythm: Normal rate and regular rhythm.  Pulmonary:     Effort: Pulmonary effort is normal.     Breath sounds: Normal breath sounds.  Abdominal:     Palpations: Abdomen is soft.   Musculoskeletal:     Right lower leg: No edema.     Left lower leg: No edema.   Skin:    General: Skin is warm and dry.   Neurological:     Mental Status: She is alert.     Comments: Oriented to place, year, can tell me who president is, is unsure what brought her to the ED other than her son was worried about her   Psychiatric:        Mood and Affect: Mood normal.        Behavior: Behavior normal.          Scheduled Medications:   carvedilol   6.25 mg Oral BID WC   enoxaparin  (LOVENOX ) injection  0.5 mg/kg Subcutaneous Q24H    ezetimibe   10 mg Oral Daily   sacubitril -valsartan   1 tablet Oral BID   spironolactone   25 mg Oral Daily    Continuous Infusions:  cefTRIAXone  (ROCEPHIN )  IV Stopped (11/14/23 0200)    PRN Medications:  acetaminophen  **OR** acetaminophen , ondansetron  **OR** ondansetron  (ZOFRAN ) IV, polyethylene glycol, traMADol   Antimicrobials from admission:  Anti-infectives (From admission, onward)    Start     Dose/Rate Route Frequency Ordered Stop   11/14/23 0000  cefTRIAXone  (ROCEPHIN ) 1 g in sodium chloride  0.9 % 100 mL IVPB  Status:  Discontinued        1 g 200 mL/hr over 30 Minutes Intravenous Every 24 hours 11/13/23 2223 11/13/23 2225   11/13/23 2225  cefTRIAXone  (ROCEPHIN ) 1 g in sodium chloride  0.9 % 100 mL IVPB        1 g 200 mL/hr over 30 Minutes Intravenous Every 24 hours 11/13/23 2225     11/13/23 2130  cefTRIAXone  (ROCEPHIN ) 1 g in sodium chloride  0.9 % 100 mL IVPB  Status:  Discontinued        1 g 200 mL/hr over 30 Minutes Intravenous  Once 11/13/23 2118 11/13/23 2225           Data Reviewed:  I have personally reviewed the following...  CBC: Recent Labs  Lab 11/13/23 1341 11/14/23 0107 11/14/23 0634  WBC 7.3 7.1 6.4  HGB 14.1 13.0 12.6  HCT 42.1 39.5 38.2  MCV 92.5 92.7 93.4  PLT 215 176 177   Basic Metabolic Panel: Recent Labs  Lab 11/13/23 1341 11/14/23 0107 11/14/23 0634  NA 142  --  141  K 3.9  --  3.5  CL 110  --  108  CO2 23  --  24  GLUCOSE 115*  --  101*  BUN 24*  --  27*  CREATININE 0.97 0.98 0.94  CALCIUM  9.8  --  9.1   GFR: Estimated Creatinine Clearance: 54 mL/min (by C-G formula based on SCr of 0.94 mg/dL). Liver Function Tests: Recent Labs  Lab 11/13/23 1341  AST 19  ALT 14  ALKPHOS 69  BILITOT 1.1  PROT 6.8  ALBUMIN 4.0   No results for input(s): LIPASE, AMYLASE in the last 168 hours. No results for input(s): AMMONIA in the last 168 hours. Coagulation Profile: No results for input(s): INR, PROTIME in the last 168  hours. Cardiac Enzymes: No results for input(s): CKTOTAL, CKMB, CKMBINDEX, TROPONINI in the last 168 hours. BNP (last 3 results)  No results for input(s): PROBNP in the last 8760 hours. HbA1C: No results for input(s): HGBA1C in the last 72 hours. CBG: No results for input(s): GLUCAP in the last 168 hours. Lipid Profile: No results for input(s): CHOL, HDL, LDLCALC, TRIG, CHOLHDL, LDLDIRECT in the last 72 hours. Thyroid  Function Tests: No results for input(s): TSH, T4TOTAL, FREET4, T3FREE, THYROIDAB in the last 72 hours. Anemia Panel: No results for input(s): VITAMINB12, FOLATE, FERRITIN, TIBC, IRON, RETICCTPCT in the last 72 hours. Most Recent Urinalysis On File:     Component Value Date/Time   COLORURINE AMBER (A) 11/13/2023 1958   APPEARANCEUR CLOUDY (A) 11/13/2023 1958   LABSPEC 1.031 (H) 11/13/2023 1958   PHURINE 5.0 11/13/2023 1958   GLUCOSEU NEGATIVE 11/13/2023 1958   HGBUR NEGATIVE 11/13/2023 1958   BILIRUBINUR NEGATIVE 11/13/2023 1958   BILIRUBINUR neg 11/25/2019 1115   KETONESUR 5 (A) 11/13/2023 1958   PROTEINUR 30 (A) 11/13/2023 1958   UROBILINOGEN 0.2 11/25/2019 1115   NITRITE NEGATIVE 11/13/2023 1958   LEUKOCYTESUR MODERATE (A) 11/13/2023 1958   Sepsis Labs: @LABRCNTIP (procalcitonin:4,lacticidven:4) Microbiology: No results found for this or any previous visit (from the past 240 hours).    Radiology Studies last 3 days: DG Chest 1 View Result Date: 11/13/2023 CLINICAL DATA:  Altered level of consciousness, somnolent EXAM: CHEST  1 VIEW COMPARISON:  10/11/2023 FINDINGS: Single frontal view of the chest demonstrates an unremarkable cardiac silhouette. No airspace disease, effusion, or pneumothorax. Stable right shoulder arthroplasty. IMPRESSION: 1. No acute intrathoracic process. Electronically Signed   By: Bobbye Burrow M.D.   On: 11/13/2023 20:20   CT Head Wo Contrast Result Date: 11/13/2023 EXAM: CT HEAD WITHOUT  CONTRAST 11/13/2023 08:09:00 PM TECHNIQUE: CT of the head was performed without the administration of intravenous contrast. Automated exposure control, iterative reconstruction, and/or weight based adjustment of the mA/kV was utilized to reduce the radiation dose to as low as reasonably achievable. COMPARISON: 11/03/2023 CLINICAL HISTORY: Mental status change, unknown cause. Pt comes in via pov with complaints of confusion. PT's son states that she has been very altered,and sleeping a lot more than usual. [name] is concerned that the patient hasn't been herself. They worry that she is losing her cognitive ability rather quickly. Pt with no complaint of pain. Pt has a history of falls, and a hip replacement. FINDINGS: BRAIN AND VENTRICLES: There is no acute intracranial hemorrhage, mass effect or midline shift. No abnormal extra-axial fluid collection. The gray-white differentiation is maintained without an acute infarct. There is no hydrocephalus. Encephalopathy changes related to prior left frontal infarct. Mild subcortical and periventricular small vessel ischemic changes. ORBITS: The visualized portion of the orbits demonstrate no acute abnormality. SINUSES: The visualized paranasal sinuses and mastoid air cells demonstrate no acute abnormality. SOFT TISSUES AND SKULL: No acute abnormality of the visualized skull or soft tissues. IMPRESSION: 1. No acute intracranial abnormality. 2. Old left frontal infarct. Mild small vessel ischemic changes. Electronically signed by: Zadie Herter MD 11/13/2023 08:14 PM EDT RP Workstation: [id number]         Melodi Sprung, DO [facility] 11/14/2023, 5:26 PM    Dictation software may have been used to generate the above note. Typos may occur and escape review in typed/dictated notes. Please contact Dr Authur Leghorn directly for clarity if needed.  Staff may message me via secure chat in Epic  but this may not receive an immediate response,  please page  me for urgent matters!  If 7PM-7AM, please contact night coverage [email or web address]

## 2023-11-14 NOTE — ED Notes (Signed)
 Patient assisted to ambulate to bedside commode with walker.  Patient had steady gait.  Intermittent confusion noted when attempting to sit down to use commode.  Pt updated on plan of care.

## 2023-11-14 NOTE — ED Notes (Signed)
 Patient intermittently confused.  Asking when she is able to leave.  Patient re-oriented to surrounds and reason for hospital admission.

## 2023-11-14 NOTE — ED Notes (Signed)
Informed RN bed assigned 

## 2023-11-14 NOTE — Hospital Course (Addendum)
 Hospital course / significant events:  Audrey Martinez is a pleasant 78 y.o. female with medical history significant for CHF, HTN, asthma, GERD presented to ED for acute change in mental status.  Per family she has been more confused lately, more sleepy than usual. 06/16 to ED. (+)UTI. Admitted to hospitalist. Was never septic. PT/OT eval, does not need SNF can f/u outpatient PT. Per son, he is unable to care for her until tomorrow 06/18, dc held     Consultants:  none  Procedures/Surgeries: none      ASSESSMENT & PLAN:   UTI No sepsis Ceftriaxone  Follow cultures   Delirium due to urinary tract infection Mild dementia  Delirium precautions  Holding gabapentin  Son was educated re: risk of sundowning / hospital delirium worsening here    HTN Hx HFpEF not in acute exacerbation  Blood pressure stable Continue home beta blocker, spironolactone , zetia , Entresto   Monitor vitals         Class 2 obesity based on BMI: Body mass index is 36.45 kg/m.Aaron Aas Significantly low or high BMI is associated with higher medical risk.  Underweight - under 18  overweight - 25 to 29 obese - 30 or more Class 1 obesity: BMI of 30.0 to 34 Class 2 obesity: BMI of 35.0 to 39 Class 3 obesity: BMI of 40.0 to 49 Super Morbid Obesity: BMI 50-59 Super-super Morbid Obesity: BMI 60+ Healthy nutrition and physical activity advised as adjunct to other disease management and risk reduction treatments    DVT prophylaxis: lovenox  IV fluids: no continuous IV fluids  Nutrition: regular Central lines / other devices: none  Code Status: FULL CODE ACP documentation reviewed:  none on file in [facility]  TOC needs: TBD Medical barriers to dispo: none. Son unwilling to get her today, needs to work tomorrow, can probably get her tomorrow

## 2023-11-14 NOTE — Evaluation (Signed)
 Physical Therapy Evaluation Patient Details Name: Audrey Martinez MRN: [id number] DOB: 04-24-1946 Today's Date: 11/14/2023  History of Present Illness  Audrey Martinez is a 77yoF here for acute AMS, admitted for UTI. PMH: Rt hip THA (May 2025), SDH, asthma, SVT, R TKA, HTN, CAD, and A-fib.  Clinical Impression  Pt awake, agreeable to assessment, demonstrates supervision level bed mobility, transfers, and AMB, all without physical assist needs, and all with moderate effort or less. Pt appears comfortable and pleasant while up AMB. Handoff to OT at end of assessment. Pt demonstrating big improvement in AMB distance/tolerance since last admission 1 month prior for THA. Will continue to follow.       If plan is discharge home, recommend the following: Assist for transportation;Assistance with cooking/housework;Direct supervision/assist for medications management;Direct supervision/assist for financial management;Help with stairs or ramp for entrance   Can travel by private vehicle        Equipment Recommendations None recommended by PT  Recommendations for Other Services       Functional Status Assessment       Precautions / Restrictions Precautions Precautions: Fall;Posterior Hip Precaution Booklet Issued: No Recall of Precautions/Restrictions: Impaired Restrictions Weight Bearing Restrictions Per Provider Order: Yes RLE Weight Bearing Per Provider Order: Weight bearing as tolerated      Mobility  Bed Mobility Overal bed mobility: Modified Independent                  Transfers Overall transfer level: Modified independent Equipment used: Rolling walker (2 wheels)                    Ambulation/Gait Ambulation/Gait assistance: Supervision, Contact guard assist Gait Distance (Feet): 200 Feet Assistive device: Rolling walker (2 wheels) Gait Pattern/deviations: WFL(Within Functional Limits), Step-through pattern     Pre-gait activities: 0.42m/s General  Gait Details: no antalgia, no signs of exertion, no frank LOB, low to moderate effort to perform.  Stairs            Wheelchair Mobility     Tilt Bed    Modified Rankin (Stroke Patients Only)       Balance Overall balance assessment: Modified Independent, History of Falls                                           Pertinent Vitals/Pain Pain Assessment Pain Assessment: No/denies pain (brief grimace during supine to sitting EOB, then otherwise pain free)    Home Living Family/patient expects to be discharged to:: Private residence Living Arrangements: Children Available Help at Discharge: Family;Available PRN/intermittently;Friend(s) Type of Home: House Home Access: Stairs to enter Entrance Stairs-Rails: None Entrance Stairs-Number of Steps: threshold step   Home Layout: One level Home Equipment: Rollator (4 wheels);Cane - single point;Hand held shower head;Standard Environmental consultant      Prior Function Prior Level of Function : Independent/Modified Independent;Driving                     Extremity/Trunk Assessment                Communication        Cognition Arousal: Alert Behavior During Therapy: WFL for tasks assessed/performed   PT - Cognitive impairments: History of cognitive impairments, Difficult to assess Difficult to assess due to:  (silly)  Cueing       General Comments      Exercises     Assessment/Plan    PT Assessment    PT Problem List         PT Treatment Interventions      PT Goals (Current goals can be found in the Care Plan section)  Acute Rehab PT Goals Patient Stated Goal: none PT Goal Formulation: With patient Time For Goal Achievement: 11/28/23 Potential to Achieve Goals: Good    Frequency Min 2X/week     Co-evaluation               AM-PAC PT 6 Clicks Mobility  Outcome Measure Help needed turning from your back to your side while in a  flat bed without using bedrails?: None Help needed moving from lying on your back to sitting on the side of a flat bed without using bedrails?: None Help needed moving to and from a bed to a chair (including a wheelchair)?: A Little Help needed standing up from a chair using your arms (e.g., wheelchair or bedside chair)?: A Little Help needed to walk in hospital room?: A Little Help needed climbing 3-5 steps with a railing? : A Little 6 Click Score: 20    End of Session Equipment Utilized During Treatment: Gait belt Activity Tolerance: Patient tolerated treatment well;No increased pain Patient left: with nursing/sitter in room   PT Visit Diagnosis: Other symptoms and signs involving the nervous system (R29.898)    Time: 4132-4401 PT Time Calculation (min) (ACUTE ONLY): 9 min   Charges:   PT Evaluation $PT Eval Low Complexity: 1 Low   PT General Charges $$ ACUTE PT VISIT: 1 Visit    1:58 PM, 11/14/23 Dawn Eth, PT, DPT Physical Therapist - [facility]  [phone] (ASCOM)    Audrey Martinez C 11/14/2023, 1:54 PM

## 2023-11-14 NOTE — Evaluation (Signed)
 Occupational Therapy Evaluation Patient Details Name: Audrey Martinez MRN: [id number] DOB: 1945/06/10 Today's Date: 11/14/2023   History of Present Illness   Audrey Martinez is a 77yoF here for acute AMS, admitted for UTI. PMH: Rt hip THA (May 2025), SDH, asthma, SVT, R TKA, HTN, CAD, and A-fib.     Clinical Impressions Patient presenting with decreased Ind in self care,balance, functional mobility/transfers, endurance, and safety awareness. Patient reports living at home with son and being Mod I with use of RW for mobility and able to perform bathing and dressing without assistance. Pt's son does work during the day and she reports she has friends at church that will assist her at discharge as needed.  Patient currently functioning at supervision secondary to cues for hand placement and safety awareness with self care and mobility tasks. Pt stands at sink for grooming tasks with close supervision but needing cues for sequencing. Pt reports it as being February and unable to verbalize year. No family present to confirm pt's baseline cognition. Patient will benefit from acute OT to increase overall independence in the areas of ADLs, functional mobility, and safety awareness in order to safely discharge .     If plan is discharge home, recommend the following:   A little help with walking and/or transfers;A little help with bathing/dressing/bathroom;Assistance with cooking/housework;Assist for transportation;Help with stairs or ramp for entrance     Functional Status Assessment   Patient has had a recent decline in their functional status and demonstrates the ability to make significant improvements in function in a reasonable and predictable amount of time.     Equipment Recommendations   BSC/3in1      Precautions/Restrictions   Precautions Precautions: Fall;Anterior Hip Restrictions Weight Bearing Restrictions Per Provider Order: Yes RLE Weight Bearing Per Provider Order:  Weight bearing as tolerated     Mobility Bed Mobility               General bed mobility comments: Pt ambulating with pt when transitioning to OT evaluation    Transfers Overall transfer level: Needs assistance Equipment used: Rolling walker (2 wheels) Transfers: Sit to/from Stand, Bed to chair/wheelchair/BSC Sit to Stand: Supervision                      ADL either performed or assessed with clinical judgement   ADL Overall ADL's : Needs assistance/impaired     Grooming: Wash/dry hands;Wash/dry face;Standing;Supervision/safety                                       Vision Patient Visual Report: No change from baseline              Pertinent Vitals/Pain Pain Assessment Pain Assessment: No/denies pain     Extremity/Trunk Assessment Upper Extremity Assessment Upper Extremity Assessment: Generalized weakness   Lower Extremity Assessment Lower Extremity Assessment: Generalized weakness       Communication Communication Communication: No apparent difficulties   Cognition Arousal: Alert Behavior During Therapy: WFL for tasks assessed/performed Cognition: Cognition impaired, No family/caregiver present to determine baseline   Orientation impairments: Time         OT - Cognition Comments: Pt reports it is [place] and unable to verbalize the year.  Home Living Family/patient expects to be discharged to:: Private residence Living Arrangements: Children Available Help at Discharge: Family;Available PRN/intermittently;Friend(s) Type of Home: House Home Access: Stairs to enter [employer] of Steps: threshold step Entrance Stairs-Rails: None Home Layout: One level     Bathroom Shower/Tub: Walk-in shower;Tub/shower unit   Bathroom Toilet: Standard Bathroom Accessibility: No   Home Equipment: Rollator (4 wheels);Cane - single point;Hand held shower head;Standard  Walker   Additional Comments: Pt reports she has friends at church in addition to children that can assist her at discharge as needed      Prior Functioning/Environment Prior Level of Function : Independent/Modified Independent;Driving             Mobility Comments: Mod Ind amb with a SW limited community distances, no falls in the last six months ADLs Comments: Ind with ADLs and IADLs    OT Problem List: Decreased strength;Decreased cognition;Decreased safety awareness;Decreased activity tolerance;Impaired balance (sitting and/or standing);Pain   OT Treatment/Interventions: Self-care/ADL training;Therapeutic activities;Therapeutic exercise;Patient/family education;Energy conservation;Balance training;Manual therapy      OT Goals(Current goals can be found in the care plan section)   Acute Rehab OT Goals Patient Stated Goal: to go home OT Goal Formulation: With patient Time For Goal Achievement: 11/28/23 Potential to Achieve Goals: Fair ADL Goals Pt Will Perform Grooming: with modified independence;standing Pt Will Perform Lower Body Dressing: with modified independence;sit to/from stand Pt Will Transfer to Toilet: with modified independence;ambulating Pt Will Perform Toileting - Clothing Manipulation and hygiene: with modified independence;sit to/from stand   OT Frequency:  Min 2X/week    Co-evaluation              AM-PAC OT 6 Clicks Daily Activity     Outcome Measure Help from another person eating meals?: None Help from another person taking care of personal grooming?: None Help from another person toileting, which includes using toliet, bedpan, or urinal?: A Little Help from another person bathing (including washing, rinsing, drying)?: A Little Help from another person to put on and taking off regular upper body clothing?: None Help from another person to put on and taking off regular lower body clothing?: A Little 6 Click Score: 21   End of Session  Equipment Utilized During Treatment: Rolling walker (2 wheels) Nurse Communication: Mobility status  Activity Tolerance: Patient tolerated treatment well Patient left: in bed;with call bell/phone within reach;with bed alarm set  OT Visit Diagnosis: Unsteadiness on feet (R26.81);Repeated falls (R29.6);Muscle weakness (generalized) (M62.81)                Time: 1610-9604 OT Time Calculation (min): 20 min Charges:  OT General Charges $OT Visit: 1 Visit OT Evaluation $OT Eval Low Complexity: 1 Low OT Treatments $Self Care/Home Management : 8-22 mins  George Kinder, MS, OTR/L , CBIS ascom [phone]  11/14/23, 2:32 PM

## 2023-11-15 DIAGNOSIS — G9341 Metabolic encephalopathy: Secondary | ICD-10-CM

## 2023-11-15 MED ORDER — CEPHALEXIN 500 MG PO CAPS
500.0000 mg | ORAL_CAPSULE | Freq: Two times a day (BID) | ORAL | Status: DC
  Administered 2023-11-15: 500 mg via ORAL

## 2023-11-15 MED ORDER — CEPHALEXIN 500 MG PO CAPS: 500.0000 mg | ORAL_CAPSULE | Freq: Two times a day (BID) | ORAL | 0 refills | Status: AC

## 2023-11-15 NOTE — Progress Notes (Addendum)
     [facility] REHABILITATION SERVICES REFERRAL  Physical Therapy :                            DATE 11/15/23   PATIENT UJW:JXBJY H. Simao    PATIENT MRN : [id number]        DIAGNOSIS/DIAGNOSIS CODE  KIDNEY AND URINARY TRACT INFECTIONS WITHOUT MCC690, KIDNEY AND URINARY TRACT INFECTIONS WITHOUT MCC690   DATE OF DISCHARGE: 11/15/23        PRIMARY CARE PHYSICIAN     PCP Dr.Laura Adrianne Horn :PHONE/FAX [address] [place] [address] & [phone]  Dear Provider [facility] Address: [address] [place], [place] [address] Phone: [phone]  I certify that I have examined this patient and that occupational/physical/speech therapy is necessary on an outpatient basis.    The patient has expressed interest in completing their recommended course of therapy at your  location.  Once a formal order from the patient's primary care physician has been obtained, please  contact him/her to schedule an appointment for evaluation at your earliest convenience.   [ x]  Physical Therapy Evaluate and Treat  [  ]  Occupational Therapy Evaluate and Treat  [  ]  Speech Therapy Evaluate and Treat         The patient's primary care physician (listed above) must furnish and be responsible for a formal order such that the recommended services may be furnished while under the primary physician's care, and that the plan of care will be established and reviewed every 30 days (or more often if condition necessitates).

## 2023-11-15 NOTE — Discharge Summary (Signed)
 Physician Discharge Summary   Patient: Audrey Martinez MRN: [id number] DOB: [birth year]  Admit date:     11/13/2023  Discharge date: 11/15/23  Discharge Physician: Ezzard Holms   PCP: Sheron Dixons, MD   Recommendations at discharge:  Follow-up with PCP  Discharge Diagnoses:  UTI Acute metabolic encephalopathy secondary to UTI Mild dementia  HTN Hx HFpEF not in acute exacerbation  Obesity class I  Hospital Course: Audrey Martinez is a pleasant 78 y.o. female with medical history significant for CHF, HTN, asthma, GERD presented to ED for acute change in mental status.  Per family she has been more confused lately, more sleepy than usual. 06/16 to ED. (+)UTI. Admitted to hospitalist. Was never septic. PT/OT eval, does not need SNF.  Patient is doing much better today fully oriented and therefore being discharged home with home health today   Consultants: None Procedures performed: None Disposition: Home Diet recommendation:  Cardiac diet DISCHARGE MEDICATION: Allergies as of 11/15/2023       Reactions   Amoxicillin Hives, Shortness Of Breath, Swelling   Has tolerated a 1st generation cephalosporin (CEFAZOLIN) in the past without documented ADRs.    Aspirin Anaphylaxis, Shortness Of Breath   Bacitracin-polymyx-neo-hc [bacitra-neomycin-polymyxin-hc] Rash   Ciprofloxacin Rash   Ciprofloxacin-dexamethasone  Anaphylaxis   Crestor  [rosuvastatin ] Other (See Comments)   Caused problems with memory.   Erythromycin Hives, Rash   Hydrochlorothiazide Other (See Comments), Palpitations   Raises her blood pressure.    Ibuprofen Other (See Comments)   Sharp stomach pains   Morphine  And Codeine Nausea And Vomiting   Morpholine Salicylate Nausea Only   Omeprazole Palpitations, Other (See Comments)   Tachycardia and same sensation as her SVTs per patient   Doxycycline Hyclate Hives   Keflex [cephalexin] Hives   Has tolerated a 1st generation cephalosporin (CEFAZOLIN) in the  past without documented ADRs.    Lovastatin Other (See Comments)   Unable to function   Simvastatin Other (See Comments)   Unable to function   Bacitracin    Dexamethasone     Silicone Dermatitis, Hives   Neomycin-bacitracin Zn-polymyx Hives, Rash   Tape Hives, Rash        Medication List     TAKE these medications    acetaminophen  325 MG tablet Commonly known as: TYLENOL  Take 1-2 tablets (325-650 mg total) by mouth every 6 (six) hours as needed for mild pain (pain score 1-3) (or temp > 100.5).   B-complex with vitamin C tablet Take 1 tablet by mouth daily.   calcium  carbonate 1250 (500 Ca) MG tablet Commonly known as: OS-CAL - dosed in mg of elemental calcium  Take 1 tablet by mouth daily with breakfast.   carvedilol  6.25 MG tablet Commonly known as: COREG  Take 1 tablet (6.25 mg total) by mouth 2 (two) times daily with a meal.   cephALEXin 500 MG capsule Commonly known as: KEFLEX Take 1 capsule (500 mg total) by mouth 2 (two) times daily for 3 days.   cholecalciferol  25 MCG (1000 UNIT) tablet Commonly known as: VITAMIN D3 Take 1,000 Units by mouth at bedtime.   Cranberry 1000 MG Caps Take 1,000 mg by mouth daily.   Entresto  24-26 MG Generic drug: sacubitril -valsartan  TAKE 1 TABLET BY MOUTH TWICE A DAY   ezetimibe  10 MG tablet Commonly known as: ZETIA  Take 1 tablet (10 mg total) by mouth daily.   fluticasone  110 MCG/ACT inhaler Commonly known as: FLOVENT  HFA Inhale 1 puff into the lungs every 12 (twelve) hours as needed.  gabapentin  300 MG capsule Commonly known as: NEURONTIN  Take 1 capsule (300 mg total) by mouth 3 (three) times daily. Give 3 capsules by mouth at bedtime.   loratadine 10 MG tablet Commonly known as: CLARITIN Take 10 mg by mouth daily as needed for allergies.   multivitamin with minerals tablet Take 1 tablet by mouth daily. Centrum Silver   ondansetron  4 MG tablet Commonly known as: ZOFRAN  Take 1 tablet (4 mg total) by mouth  every 6 (six) hours as needed for nausea.   polyethylene glycol 17 g packet Commonly known as: MIRALAX  / GLYCOLAX  Take 17 g by mouth daily as needed.   spironolactone  25 MG tablet Commonly known as: ALDACTONE  Take 1 tablet (25 mg total) by mouth daily.   traMADol  50 MG tablet Commonly known as: ULTRAM  Take 1 tablet (50 mg total) by mouth every 6 (six) hours as needed for moderate pain (pain score 4-6).   Turmeric 500 MG Caps Take 500 mg by mouth daily.               Durable Medical Equipment  (From admission, onward)           Start     Ordered   11/14/23 1520  For home use only DME 3 n 1  Once        11/14/23 1520            Discharge Exam: Filed Weights   11/13/23 1337  Weight: 90.4 kg    Constitutional:      General: She is not in acute distress.    Appearance: She is not ill-appearing.    Cardiovascular:     Rate and Rhythm: Normal rate and regular rhythm.  Pulmonary:     Effort: Pulmonary effort is normal.     Breath sounds: Normal breath sounds.  Abdominal:     Palpations: Abdomen is soft.    Musculoskeletal:     Right lower leg: No edema.     Left lower leg: No edema.    Skin:    General: Skin is warm and dry.    Condition at discharge: good  The results of significant diagnostics from this hospitalization (including imaging, microbiology, ancillary and laboratory) are listed below for reference.   Imaging Studies: DG Chest 1 View Result Date: 11/13/2023 CLINICAL DATA:  Altered level of consciousness, somnolent EXAM: CHEST  1 VIEW COMPARISON:  10/11/2023 FINDINGS: Single frontal view of the chest demonstrates an unremarkable cardiac silhouette. No airspace disease, effusion, or pneumothorax. Stable right shoulder arthroplasty. IMPRESSION: 1. No acute intrathoracic process. Electronically Signed   By: Bobbye Burrow M.D.   On: 11/13/2023 20:20   CT Head Wo Contrast Result Date: 11/13/2023 EXAM: CT HEAD WITHOUT CONTRAST 11/13/2023  08:09:00 PM TECHNIQUE: CT of the head was performed without the administration of intravenous contrast. Automated exposure control, iterative reconstruction, and/or weight based adjustment of the mA/kV was utilized to reduce the radiation dose to as low as reasonably achievable. COMPARISON: 11/03/2023 CLINICAL HISTORY: Mental status change, unknown cause. Pt comes in via pov with complaints of confusion. PT's son states that she has been very altered,and sleeping a lot more than usual. [name] is concerned that the patient hasn't been herself. They worry that she is losing her cognitive ability rather quickly. Pt with no complaint of pain. Pt has a history of falls, and a hip replacement. FINDINGS: BRAIN AND VENTRICLES: There is no acute intracranial hemorrhage, mass effect or midline shift. No abnormal extra-axial fluid collection.  The gray-white differentiation is maintained without an acute infarct. There is no hydrocephalus. Encephalopathy changes related to prior left frontal infarct. Mild subcortical and periventricular small vessel ischemic changes. ORBITS: The visualized portion of the orbits demonstrate no acute abnormality. SINUSES: The visualized paranasal sinuses and mastoid air cells demonstrate no acute abnormality. SOFT TISSUES AND SKULL: No acute abnormality of the visualized skull or soft tissues. IMPRESSION: 1. No acute intracranial abnormality. 2. Old left frontal infarct. Mild small vessel ischemic changes. Electronically signed by: Zadie Herter MD 11/13/2023 08:14 PM EDT RP Workstation: [id number]   CT Head Wo Contrast Result Date: 11/03/2023 CLINICAL DATA:  Status post fall. Hit head on floor with laceration and bleeding. EXAM: CT HEAD WITHOUT CONTRAST CT CERVICAL SPINE WITHOUT CONTRAST TECHNIQUE: Multidetector CT imaging of the head and cervical spine was performed following the standard protocol without intravenous contrast. Multiplanar CT image reconstructions of the cervical spine were  also generated. RADIATION DOSE REDUCTION: This exam was performed according to the departmental dose-optimization program which includes automated exposure control, adjustment of the mA and/or kV according to patient size and/or use of iterative reconstruction technique. COMPARISON:  10/11/2023 FINDINGS: CT HEAD FINDINGS Brain: No evidence of acute infarction, hemorrhage, hydrocephalus, extra-axial collection or mass lesion/mass effect. Left frontal lobe encephalomalacia. Remote right cerebellar hemisphere lacunar infarcts. Vascular: No hyperdense vessel or unexpected calcification. Skull: Normal. Negative for fracture or focal lesion. Sinuses/Orbits: Paranasal sinuses and mastoid air cells are clear. Other: Right frontal scalp laceration. CT CERVICAL SPINE FINDINGS Alignment: Normal. Skull base and vertebrae: No acute fracture. No primary bone lesion or focal pathologic process. Soft tissues and spinal canal: No prevertebral fluid or swelling. No visible canal hematoma. Disc levels: Multilevel disc space narrowing and endplate spurring identified at C5-6, C6-7 and C7-T1. Upper chest: Negative. Other: None IMPRESSION: 1. No acute intracranial abnormalities. 2. Right frontal scalp laceration. 3. No evidence for cervical spine fracture or subluxation. 4. Cervical degenerative disc disease. Electronically Signed   By: Kimberley Penman M.D.   On: 11/03/2023 08:23   CT Cervical Spine Wo Contrast Result Date: 11/03/2023 CLINICAL DATA:  Status post fall. Hit head on floor with laceration and bleeding. EXAM: CT HEAD WITHOUT CONTRAST CT CERVICAL SPINE WITHOUT CONTRAST TECHNIQUE: Multidetector CT imaging of the head and cervical spine was performed following the standard protocol without intravenous contrast. Multiplanar CT image reconstructions of the cervical spine were also generated. RADIATION DOSE REDUCTION: This exam was performed according to the departmental dose-optimization program which includes automated exposure  control, adjustment of the mA and/or kV according to patient size and/or use of iterative reconstruction technique. COMPARISON:  10/11/2023 FINDINGS: CT HEAD FINDINGS Brain: No evidence of acute infarction, hemorrhage, hydrocephalus, extra-axial collection or mass lesion/mass effect. Left frontal lobe encephalomalacia. Remote right cerebellar hemisphere lacunar infarcts. Vascular: No hyperdense vessel or unexpected calcification. Skull: Normal. Negative for fracture or focal lesion. Sinuses/Orbits: Paranasal sinuses and mastoid air cells are clear. Other: Right frontal scalp laceration. CT CERVICAL SPINE FINDINGS Alignment: Normal. Skull base and vertebrae: No acute fracture. No primary bone lesion or focal pathologic process. Soft tissues and spinal canal: No prevertebral fluid or swelling. No visible canal hematoma. Disc levels: Multilevel disc space narrowing and endplate spurring identified at C5-6, C6-7 and C7-T1. Upper chest: Negative. Other: None IMPRESSION: 1. No acute intracranial abnormalities. 2. Right frontal scalp laceration. 3. No evidence for cervical spine fracture or subluxation. 4. Cervical degenerative disc disease. Electronically Signed   By: Kimberley Penman M.D.   On: 11/03/2023  08:23    Microbiology: Results for orders placed or performed during the hospital encounter of 10/09/23  Culture, blood (Routine X 2) w Reflex to ID Panel     Status: None   Collection Time: 10/11/23  5:52 PM   Specimen: BLOOD  Result Value Ref Range Status   Specimen Description BLOOD BLOOD LEFT HAND  Final   Special Requests   Final    BOTTLES DRAWN AEROBIC AND ANAEROBIC Blood Culture adequate volume   Culture   Final    NO GROWTH 5 DAYS Performed at [facility], [address] [place], [place] [address]    Report Status 10/16/2023 FINAL  Final  Culture, blood (Routine X 2) w Reflex to ID Panel     Status: None   Collection Time: 10/11/23  5:53 PM   Specimen: BLOOD  Result Value Ref Range  Status   Specimen Description BLOOD BLOOD LEFT FOREARM  Final   Special Requests   Final    BOTTLES DRAWN AEROBIC ONLY Blood Culture results may not be optimal due to an inadequate volume of blood received in culture bottles   Culture   Final    NO GROWTH 5 DAYS Performed at [facility], [address] [place], [place] [address]    Report Status 10/16/2023 FINAL  Final  MRSA Next Gen by PCR, Nasal     Status: None   Collection Time: 10/11/23  6:24 PM   Specimen: Nasal Mucosa; Nasal Swab  Result Value Ref Range Status   MRSA by PCR Next Gen NOT DETECTED NOT DETECTED Final    Comment: (NOTE) The GeneXpert MRSA Assay (FDA approved for NASAL specimens only), is one component of a comprehensive MRSA colonization surveillance program. It is not intended to diagnose MRSA infection nor to guide or monitor treatment for MRSA infections. Test performance is not FDA approved in patients less than 43 years old. Performed at [facility], [address] [place], [place] [address]   SARS Coronavirus 2 by RT PCR (hospital order, performed in [facility] hospital lab) *cepheid single result test* Anterior Nasal Swab     Status: None   Collection Time: 10/12/23  3:40 PM   Specimen: Anterior Nasal Swab  Result Value Ref Range Status   SARS Coronavirus 2 by RT PCR NEGATIVE NEGATIVE Final    Comment: (NOTE) SARS-CoV-2 target nucleic acids are NOT DETECTED.  The SARS-CoV-2 RNA is generally detectable in upper and lower respiratory specimens during the acute phase of infection. The lowest concentration of SARS-CoV-2 viral copies this assay can detect is 250 copies / mL. A negative result does not preclude SARS-CoV-2 infection and should not be used as the sole basis for treatment or other patient management decisions.  A negative result may occur with improper specimen collection / handling, submission of specimen other than nasopharyngeal swab, presence of viral mutation(s)  within the areas targeted by this assay, and inadequate number of viral copies (<250 copies / mL). A negative result must be combined with clinical observations, patient history, and epidemiological information.  Fact Sheet for Patients:   RoadLapTop.co.za  Fact Sheet for Healthcare Providers: http://kim-miller.com/  This test is not yet approved or  cleared by the [place]  FDA and has been authorized for detection and/or diagnosis of SARS-CoV-2 by FDA under an Emergency Use Authorization (EUA).  This EUA will remain in effect (meaning this test can be used) for the duration of the COVID-19 declaration under Section 564(b)(1) of the Act,  21 U.S.C. section 360bbb-3(b)(1), unless the authorization is terminated or revoked sooner.  Performed at [facility], [address] [place], [place] [address]   Respiratory (~20 pathogens) panel by PCR     Status: None   Collection Time: 10/12/23  3:40 PM   Specimen: Nasopharyngeal Swab; Respiratory  Result Value Ref Range Status   Adenovirus NOT DETECTED NOT DETECTED Final   Coronavirus 229E NOT DETECTED NOT DETECTED Final    Comment: (NOTE) The Coronavirus on the Respiratory Panel, DOES NOT test for the novel  Coronavirus (2019 nCoV)    Coronavirus HKU1 NOT DETECTED NOT DETECTED Final   Coronavirus NL63 NOT DETECTED NOT DETECTED Final   Coronavirus OC43 NOT DETECTED NOT DETECTED Final   Metapneumovirus NOT DETECTED NOT DETECTED Final   Rhinovirus / Enterovirus NOT DETECTED NOT DETECTED Final   Influenza A NOT DETECTED NOT DETECTED Final   Influenza B NOT DETECTED NOT DETECTED Final   Parainfluenza Virus 1 NOT DETECTED NOT DETECTED Final   Parainfluenza Virus 2 NOT DETECTED NOT DETECTED Final   Parainfluenza Virus 3 NOT DETECTED NOT DETECTED Final   Parainfluenza Virus 4 NOT DETECTED NOT DETECTED Final   Respiratory Syncytial Virus NOT DETECTED NOT DETECTED Final   Bordetella  pertussis NOT DETECTED NOT DETECTED Final   Bordetella Parapertussis NOT DETECTED NOT DETECTED Final   Chlamydophila pneumoniae NOT DETECTED NOT DETECTED Final   Mycoplasma pneumoniae NOT DETECTED NOT DETECTED Final    Comment: Performed at [facility], [address], [place], [place] [address]    Labs: CBC: Recent Labs  Lab 11/13/23 1341 11/14/23 0107 11/14/23 0634  WBC 7.3 7.1 6.4  HGB 14.1 13.0 12.6  HCT 42.1 39.5 38.2  MCV 92.5 92.7 93.4  PLT 215 176 177   Basic Metabolic Panel: Recent Labs  Lab 11/13/23 1341 11/14/23 0107 11/14/23 0634  NA 142  --  141  K 3.9  --  3.5  CL 110  --  108  CO2 23  --  24  GLUCOSE 115*  --  101*  BUN 24*  --  27*  CREATININE 0.97 0.98 0.94  CALCIUM  9.8  --  9.1   Liver Function Tests: Recent Labs  Lab 11/13/23 1341  AST 19  ALT 14  ALKPHOS 69  BILITOT 1.1  PROT 6.8  ALBUMIN 4.0   CBG: No results for input(s): GLUCAP in the last 168 hours.  Discharge time spent:  .  Signed: Ezzard Holms, MD [facility] 11/15/2023

## 2023-11-15 NOTE — Plan of Care (Signed)
   Problem: Education: Goal: Knowledge of General Education information will improve Description Including pain rating scale, medication(s)/side effects and non-pharmacologic comfort measures Outcome: Progressing

## 2023-11-15 NOTE — TOC Transition Note (Signed)
 Transition of Care [facility]) - Discharge Note   Patient Details  Name: Audrey Martinez MRN: [id number] Date of Birth: 08-28-1945  Transition of [facility]) CM/SW Contact:  Zoe Hinds, RN Phone Number: 11/15/2023, 3:43 PM   Clinical Narrative:    This CM updated by covering MD pt medically cleared to dc today and has active DC order . This CM spoke with Liaison at [employer] and confirmed referral was rec'd for DME . This CM also faxed over out  patient PT referral to pt's 1st choice . DC transportation confirmed for pt with family .Medical team updated . No additional DC needs requested by medical team or identified by CM at this time .     Final next level of care: Home/Self Care Barriers to Discharge: No Barriers Identified     Name of family member notified: Pt Patient and family notified of of transfer: 11/15/23  Discharge Plan and Services Additional resources added to the After Visit Summary for                  DME Arranged: 3-N-1 DME Agency: [facility] Date DME Agency Contacted: 11/15/23 Time DME Agency Contacted: 1310 Representative spoke with at DME Agency: Sam Creighton [phone]            Social Drivers of Health (SDOH) Interventions SDOH Screenings   Food Insecurity: No Food Insecurity (11/14/2023)  Housing: Low Risk  (11/14/2023)  Transportation Needs: No Transportation Needs (11/14/2023)  Utilities: Not At Risk (11/14/2023)  Alcohol Screen: Low Risk  (04/24/2023)  Depression (PHQ2-9): Low Risk  (11/10/2023)  Financial Resource Strain: Low Risk  (08/02/2023)   Received from [facility]  Physical Activity: Insufficiently Active (04/24/2023)  Social Connections: Moderately Isolated (11/14/2023)  Stress: No Stress Concern Present (04/24/2023)  Tobacco Use: Low Risk  (11/13/2023)  Health Literacy: Adequate Health Literacy (04/24/2023)     Readmission Risk Interventions     No data to display

## 2023-11-15 NOTE — Progress Notes (Signed)
 Physical Therapy Treatment Patient Details Name: Audrey Martinez MRN: [id number] DOB: 18-Jul-1945 Today's Date: 11/15/2023   History of Present Illness Audrey Martinez is a 77yoF here for acute AMS, admitted for UTI. PMH: Rt hip THA (May 2025), SDH, asthma, SVT, R TKA, HTN, CAD, and A-fib.  Pt performed curb negotiation with RW requiring cues for safe sequence at Min A level.  Pt performs functional mobility at a Mod I to supervision level overall.  Continued PT will assist pt towards greater dynamic standing balance, LE strengthening, and activity tolerance to increase safety and independence with functional mobility.  PT Comments     If plan is discharge home, recommend the following:     Can travel by private vehicle        Equipment Recommendations  None recommended by PT    Recommendations for Other Services       Precautions / Restrictions Precautions Precautions: Fall;Anterior Hip Precaution Booklet Issued: No Recall of Precautions/Restrictions: Intact Precaution/Restrictions Comments: pt demonstrated hip precautions during functional mobility Restrictions Weight Bearing Restrictions Per Provider Order: Yes RLE Weight Bearing Per Provider Order: Weight bearing as tolerated     Mobility  Bed Mobility Overal bed mobility: Modified Independent             General bed mobility comments: HOB slightly raised; no rails needed.    Transfers Overall transfer level: Needs assistance Equipment used: Rolling walker (2 wheels) Transfers: Sit to/from Stand, Bed to chair/wheelchair/BSC Sit to Stand: Supervision   Step pivot transfers: Supervision            Ambulation/Gait Ambulation/Gait assistance: Supervision Gait Distance (Feet): 200 Feet Assistive device: Rolling walker (2 wheels) Gait Pattern/deviations: WFL(Within Functional Limits), Step-through pattern       General Gait Details: no antalgia, no signs of exertion, no frank LOB, low to moderate effort  to perform.   Stairs Stairs: Yes Stairs assistance: Min assist Stair Management: Step to pattern, With walker Number of Stairs: 1 General stair comments: Initially, pt was anxious attempting curb negotiation with RW attempting to lead with R LE.  Practiced on steps with rails and pt had more confidence with leading with L LE stepping up.  Followed through with that technique on curb with RW Min A to manage walker for balance.   Wheelchair Mobility     Tilt Bed    Modified Rankin (Stroke Patients Only)       Balance Overall balance assessment: Modified Independent, History of Falls                                          Communication Communication Communication: No apparent difficulties  Cognition Arousal: Alert Behavior During Therapy: WFL for tasks assessed/performed   PT - Cognitive impairments: History of cognitive impairments, Difficult to assess                         Following commands: Intact      Cueing Cueing Techniques: Verbal cues  Exercises      General Comments        Pertinent Vitals/Pain Pain Assessment Pain Assessment: No/denies pain    Home Living                          Prior Function  PT Goals (current goals can now be found in the care plan section) Acute Rehab PT Goals Patient Stated Goal: none PT Goal Formulation: With patient Time For Goal Achievement: 11/28/23 Potential to Achieve Goals: Good Progress towards PT goals: Progressing toward goals    Frequency    Min 2X/week      PT Plan      Co-evaluation              AM-PAC PT 6 Clicks Mobility   Outcome Measure  Help needed turning from your back to your side while in a flat bed without using bedrails?: None Help needed moving from lying on your back to sitting on the side of a flat bed without using bedrails?: None Help needed moving to and from a bed to a chair (including a wheelchair)?: A Little Help  needed standing up from a chair using your arms (e.g., wheelchair or bedside chair)?: A Little Help needed to walk in hospital room?: A Little Help needed climbing 3-5 steps with a railing? : A Little 6 Click Score: 20    End of Session Equipment Utilized During Treatment: Gait belt Activity Tolerance: Patient tolerated treatment well;No increased pain Patient left: in chair;with chair alarm set;with call bell/phone within reach Nurse Communication: Mobility status PT Visit Diagnosis: Other symptoms and signs involving the nervous system (R29.898)     Time: 5409-8119 PT Time Calculation (min) (ACUTE ONLY): 19 min  Charges:    $Gait Training: 8-22 mins PT General Charges $$ ACUTE PT VISIT: 1 Visit                     Eliazar Gross, PTA  11/15/23, 11:15 AM

## 2023-11-16 ENCOUNTER — Telehealth: Payer: Self-pay | Admitting: *Deleted

## 2023-11-16 ENCOUNTER — Other Ambulatory Visit: Payer: Self-pay | Admitting: Cardiovascular Disease

## 2023-11-16 DIAGNOSIS — I10 Essential (primary) hypertension: Secondary | ICD-10-CM

## 2023-11-16 NOTE — Transitions of Care (Post Inpatient/ED Visit) (Signed)
 11/16/2023  Name: Audrey Martinez MRN: [id number] DOB: 05/22/1946  Today's TOC FU Call Status: Today's TOC FU Call Status:: Successful TOC FU Call Completed TOC FU Call Complete Date: 11/16/23 Patient's Name and Date of Birth confirmed.  Transition Care Management Follow-up Telephone Call Date of Discharge: 11/15/23 Discharge Facility: [facility]) Type of Discharge: Inpatient Admission Primary Inpatient Discharge Diagnosis:: confusion- AMS secondary to UTI without sepsis How have you been since you were released from the hospital?: Better (I am doing much better than I was; I have not heard from [facility] yet that the antibiotic is ready- I will call them after we hang up.  I will listen out for the nurse call tomorrow morning and try to have my medicines ready for her to go over with me) Any questions or concerns?: No  Items Reviewed: Did you receive and understand the discharge instructions provided?: Yes (Reports unable to locate hospital discharge AVS at time of TOC call- reviewed verbally only with patient who verbalizes fair understanding of same: requires significant repetition around post- hospital discharge instructions for appointments and medications) Medications obtained,verified, and reconciled?: No (Confirmed - per patient- she has not yet obtained/ taking newly Rx'd antibiotic medication-- reports has not yet heard from outpatient pharmacy that it is ready for pick up; she seems surprised that antibiotics were ordered, I didn't know that; reports self-manages medications and denies questions/ concerns around medications today) Medications Not Reviewed Reasons:: Other: (Patient declined: states she is not near her medications and does not have a list in front of her; encouraged her to have her medications ready for review at time of longitudinal RN CM scheduled 11/17/23 and she is agreeable to this plan)  Discussed/ reinforced signs/ symptoms UTI  along with corresponding action plan; discussed and provided education around importance of ensuring she is taking antibiotics as prescribed post-hospital discharge Due to patient's apparent confusion around her medications/ newly prescribed antibiotic: care coordination outreach to outpatient pharmacy ([facility] [phone]) to follow up on antibiotic status: Cephalexin was picked up 11/15/23 at 5:00 pm by the patient per [facility] outpatient [facility] team  Attempted to re-contact patient to update her re: status of antibiotic and need to locate in her home/ ensure she is taking correctly: phone rang multiple times without answer Contacted longitudinal RN CM to confirm that patient did obtain post-hospital discharge ordered antibiotics - updated on general details of TOC call  Any new allergies since your discharge?: No Dietary orders reviewed?: Yes Type of Diet Ordered:: As healthy as I can, my son is keeping me on track Do you have support at home?: Yes People in Home [RPT]: child(ren), adult Name of Support/Comfort Primary Source: Reports essentially independent in self-care activities; resides with supportive son assists as/ if needed/ indicated  Medications Reviewed Today: Medications Reviewed Today     Reviewed by Glorine Hanratty M, RN (Registered Nurse) on 11/16/23 at 1516  Med List Status: <None>   Medication Order Taking? Sig Documenting Provider Last Dose Status Informant  acetaminophen  (TYLENOL ) 325 MG tablet [id number]  Take 1-2 tablets (325-650 mg total) by mouth every 6 (six) hours as needed for mild pain (pain score 1-3) (or temp > 100.5). Coralyn Derry, PA-C  Active Child  B Complex-C (B-COMPLEX WITH VITAMIN C) tablet [id number]  Take 1 tablet by mouth daily.  [provider]  Active Child  calcium  carbonate (OS-CAL - DOSED IN MG OF ELEMENTAL CALCIUM ) 1250 (500 Ca) MG tablet [id number]  Take 1  tablet by mouth daily with breakfast. [provider]  Active Child   carvedilol  (COREG ) 6.25 MG tablet [id number]  Take 1 tablet (6.25 mg total) by mouth 2 (two) times daily with a meal. Medina-Vargas, Monina C, NP  Active Child  cephALEXin (KEFLEX) 500 MG capsule [id number]  Take 1 capsule (500 mg total) by mouth 2 (two) times daily for 3 days. Ezzard Holms, MD  Active   cholecalciferol  (VITAMIN D ) 25 MCG (1000 UT) tablet [id number]  Take 1,000 Units by mouth at bedtime. [provider]  Active Child  Cranberry 1000 MG CAPS [id number]  Take 1,000 mg by mouth daily.  [provider]  Active Child  ezetimibe  (ZETIA ) 10 MG tablet [id number]  Take 1 tablet (10 mg total) by mouth daily. Medina-Vargas, Monina C, NP  Active Child  fluticasone  (FLOVENT  HFA) 110 MCG/ACT inhaler [id number]  Inhale 1 puff into the lungs every 12 (twelve) hours as needed. Medina-Vargas, Monina C, NP  Active Child  gabapentin  (NEURONTIN ) 300 MG capsule [id number]  Take 1 capsule (300 mg total) by mouth 3 (three) times daily. Give 3 capsules by mouth at bedtime. Medina-Vargas, Monina C, NP  Active Child           Med Note Jimmy Moulding   Tue Nov 14, 2023  7:05 AM) 1 morning, noon, and at 5pm and 3 at bedtime  loratadine (CLARITIN) 10 MG tablet [id number]  Take 10 mg by mouth daily as needed for allergies. [provider]  Active Child  Multiple Vitamins-Minerals (MULTIVITAMIN WITH MINERALS) tablet [id number]  Take 1 tablet by mouth daily. Centrum Silver [provider]  Active Child  ondansetron  (ZOFRAN ) 4 MG tablet [id number]  Take 1 tablet (4 mg total) by mouth every 6 (six) hours as needed for nausea. Medina-Vargas, Monina C, NP  Active Child  polyethylene glycol (MIRALAX  / GLYCOLAX ) 17 g packet [id number]  Take 17 g by mouth daily as needed. [provider]  Active Child  sacubitril -valsartan  (ENTRESTO ) 24-26 MG [id number]  TAKE 1 TABLET BY MOUTH TWICE A DAY Medina-Vargas, Monina C, NP  Active Child  spironolactone  (ALDACTONE ) 25 MG tablet [id number]  Take 1  tablet (25 mg total) by mouth daily. Medina-Vargas, Monina C, NP  Active Child  traMADol  (ULTRAM ) 50 MG tablet [id number]  Take 1 tablet (50 mg total) by mouth every 6 (six) hours as needed for moderate pain (pain score 4-6). Medina-Vargas, Monina C, NP  Active Child  Turmeric 500 MG CAPS [id number]  Take 500 mg by mouth daily.  [provider]  Active Child           Home Care and Equipment/Supplies: Were Home Health Services Ordered?: No Any new equipment or medical supplies ordered?: Yes Name of Medical supply agency?: [employer] D[employer] company: [employer]-- patient reports that she was told this would be delivered to her home at some point this week Were you able to get the equipment/medical supplies?: No (not yet and then I am not sure I even need it to tell you the truth) Do you have any questions related to the use of the equipment/supplies?: No  Functional Questionnaire: Do you need assistance with bathing/showering or dressing?: No Do you need assistance with meal preparation?: No Do you need assistance with eating?: No Do you have difficulty maintaining continence: No Do you need assistance with getting out of bed/getting out of a chair/moving?: No Do you have difficulty managing or taking your medications?: No (Reports she self-manages medications but seems  confused around routine medications during TOC call: encouraged her to be prepared to discuss/ review medications with longitudinal RN CM for scheduled call 11/17/23- she is agreeable)  Follow up appointments reviewed: PCP Follow-up appointment confirmed?: Yes (care coordination outreach in real-time with scheduling care guide to successfully schedule hospital follow up PCP appointment 11/21/23) Date of PCP follow-up appointment?: 11/21/23 Follow-up Provider: PCP- Dr. Gala Jubilee Specialist [facility] Follow-up appointment confirmed?: NA (verified not indicated per hospital discharging provider discharge notes) Do you need  transportation to your follow-up appointment?: No Do you understand care options if your condition(s) worsen?: Yes-patient verbalized understanding  SDOH Interventions Today    Flowsheet Row Most Recent Value  SDOH Interventions   Food Insecurity Interventions Intervention Not Indicated  Housing Interventions Intervention Not Indicated  Transportation Interventions Intervention Not Indicated  [Reports drives self]  Utilities Interventions Intervention Not Indicated   Patient declines need for ongoing/ further TOC care management weekly outreach; declines enrollment in 30-day TOC program- verbalizes understanding that longitudinal RN CM scheduled to call tomorrow morning; states that she will have her medications ready for review at that time   See TOC assessment tabs for additional assessment/ TOC intervention information  Pls call/ message for questions,  Erlene Hawks, RN, BSN, CCRN Alumnus RN Care Manager  Transitions of Care  [facility] - [facility] [phone]: direct office

## 2023-11-17 ENCOUNTER — Telehealth: Payer: Self-pay

## 2023-11-17 ENCOUNTER — Other Ambulatory Visit: Payer: Self-pay

## 2023-11-17 NOTE — Patient Instructions (Signed)
 Visit Information  Thank you for taking time to visit with me today. Please don't hesitate to contact me if I can be of assistance to you before our next scheduled appointment.  Our next appointment is by telephone on Thursday, June 26th at 10:30am Please call the care guide team at [phone] if you need to cancel or reschedule your appointment.   Following is a copy of your care plan:   Goals Addressed             This Visit's Progress    [facility]BC[facility] RN Care Plan       Problems:  Chronic Disease Management support and education needs related to Impaired Mobility, Falls  Goal: Over the next 60 days the Patient will demonstrate Improved adherence to prescribed treatment plan for impaired mobility as evidenced by no falls   Interventions:   Evaluation of current treatment plan related to falls , Limited education about preventing falls* self-management and patient's adherence to plan as established by provider. Discussed plans with patient for ongoing care management follow up and provided patient with direct contact information for care management team Advised patient to take care with ambulation, using walker at all times.  Reviewed medications with patient and discussed Keflex, making sure she has the medication in the home to take as prescribed.  This RNCM will reach out to son, Marietta Shorter since he manages medications/prepares the pillbox for patient Reviewed scheduled/upcoming provider appointments including follow up appt with Dr. Gala Jubilee 11/21/23.   Patient Self-Care Activities:  Attend all scheduled provider appointments Call provider office for new concerns or questions  Take medications as prescribed    Plan:  Telephone follow up appointment with care management team member scheduled for:  two weeks.             Please call the [place]  National Suicide Prevention Lifeline: (986)790-2957 or TTY: 562-451-7521 TTY [phone]) to talk to a trained counselor if you are  experiencing a Mental Health or Behavioral Health Crisis or need someone to talk to.  Patient verbalizes understanding of instructions and care plan provided today and agrees to view in MyChart. Active MyChart status and patient understanding of how to access instructions and care plan via MyChart confirmed with patient.     Jurline Olmsted BSN, CCM [place]  [facility] Population Health RN Care Manager Direct Dial: [phone]  Fax: [phone]

## 2023-11-17 NOTE — Patient Outreach (Addendum)
 Complex Care Management   Visit Note  11/17/2023  Name:  Audrey Martinez MRN: [id number] DOB: [birth year]  Situation: Referral received for Complex Care Management related to recent fall in May, recent hospitalization for UTI.  I obtained verbal consent from Patient.  Visit completed with [name]  on the phone. Patient discharged from hospital 6/18 for UTI, prescribed cefalexin 500mg  1 cap twice a day x3 days, home health ordered but patient states she doesn't know anything about the home health order/has not heard from agency.    Background:   Past Medical History:  Diagnosis Date   Actinic keratosis    Acute renal failure (ARF) (HCC) 01/31/2020   Acute respiratory failure with hypoxia (HCC) 01/29/2020   Amaurosis fugax, right eye 12/2018   Aortic atherosclerosis (HCC)    Arthritis    Asthma    Atrial tachycardia (HCC)    a.) s/p RFCA for AVNRT in 2008 at [facility] [place]    Basal cell carcinoma 07/2016   central forehead (tx in [place] )   Bilateral pulmonary embolism (HCC) 01/28/2020   a.) in setting of SARS-CoV-2 infection (12/2019); noted BILATERALLY with (+) associated RIGHT heart strain; Tx'd with rivaroxaban  Xarelto  20 mg daily x 6 months then PRN   Cerebellar infarct (RIGHT) 1998   Coronary artery disease    DDD (degenerative disc disease), cervical    Dyspnea    Facial laceration 06/16/2022   Date of injury: 06/08/2022  [facility] ED history below:  78 year old female presenting to the emergency department after mechanical, nonsyncopal fall causing her to strike her face on the edge/corner of a curio cabinet and sustaining a deep and approximate 8 cm laceration to the right cheek in an irregular pattern to the angle of the right lower jaw. Wound does not proceed th   GERD (gastroesophageal reflux disease)    Herniated disc, lumbar    HFrEF (heart failure with reduced ejection fraction) (HCC)    History of 2019 novel  coronavirus disease (COVID-19) 12/2019   History of bilateral cataract extraction    Hyperlipidemia    Incomplete left bundle branch block (LBBB)    Left wrist fracture    Lumbar stenosis    s/p LEFT L2-4 hemilaminectomies and partial facetectomies, foraminotomies for lateral recess and foraminal decompression of L3 and L4 nerve roots   Melanoma (HCC) 05/12/2020   Melanoma IS R post neck, excised 08/10/20   Mobitz type 1 second degree atrioventricular block    a.) evaluated by EP --> paroxysmal in nature; nocturnal epidoe felt to be vagally mediated   NICM (nonischemic cardiomyopathy) (HCC)    a. 04/2018 Echo: EF 45-50%. Anteroseptal and apical HK in some views. Mild AI/MR. Nl RV fxn; b. 05/2018 MV: mid-dist ant and antsept mild defect - ? breast atten. EF 41%. No ischemia; c. 03/2019 Echo: EF 30-35%, Gr2 DD, Trace MR, triv TR. Asc Ao ectatic dil - 38mm.    Osteoporosis    Personal history of transient ischemic attack (TIA), and cerebral infarction without residual deficits 04/22/2003   Pneumonia due to COVID-19 virus 01/26/2020   SVT (supraventricular tachycardia) (HCC)    a.2008 s/p RFCA for AVNRT; b. 9 & 02/2019 Zio x 2: 1. Avg HR 89, 3 runs NSVT (longest 7 beats), Mobitz 1. 2. Avg HR 89, NSVT x 1 (5 beats), Mobitz 1. No signif arrhythmia.   Tendinitis of right knee 07/03/2017    Assessment: Patient Reported Symptoms:  Cognitive Cognitive Status: Alert and oriented to  person, place, and time, Requires Assistance Decision Making      Neurological Neurological Review of Symptoms: No symptoms reported    HEENT HEENT Symptoms Reported: No symptoms reported      Cardiovascular Cardiovascular Symptoms Reported: No symptoms reported    Respiratory Respiratory Symptoms Reported: No symptoms reported Other Respiratory Symptoms: Uses Flovent  as needed if she experiences shortness of breath.    Endocrine Patient reports the following symptoms related to hypoglycemia or hyperglycemia : No  symptoms reported Is patient diabetic?: No    Gastrointestinal Gastrointestinal Symptoms Reported: No symptoms reported Additional Gastrointestinal Details: Bowel plan is to use Miralax  if constipation lasts for more than 2-3 days, has Miralax  in the home to use as needed. Gastrointestinal Self-Management Outcome: 4 (good)    Genitourinary Genitourinary Symptoms Reported: No symptoms reported    Integumentary Integumentary Symptoms Reported: No symptoms reported    Musculoskeletal Musculoskelatal Symptoms Reviewed: Unsteady gait Additional Musculoskeletal Details: using walker with ambulation.  awaiting call for home health evaluation, per hospital order dated 11/15/23.   Falls in the past year?: Yes Number of falls in past year: 1 or less Was there an injury with Fall?: Yes Fall Risk Category Calculator: 2 Patient Fall Risk Level: Moderate Fall Risk Patient at Risk for Falls Due to: History of fall(s)  Psychosocial       Quality of Family Relationships: helpful, involved, supportive Do you feel physically threatened by others?: No      11/16/2023    2:53 PM  Depression screen PHQ 2/9  Decreased Interest 0  Down, Depressed, Hopeless 0  PHQ - 2 Score 0    There were no vitals filed for this visit.  Medications Reviewed Today     Reviewed by Isadore Marble, RN (Registered Nurse) on 11/17/23 at 1024  Med List Status: <None>   Medication Order Taking? Sig Documenting Provider Last Dose Status Informant  acetaminophen  (TYLENOL ) 325 MG tablet [id number] Yes Take 1-2 tablets (325-650 mg total) by mouth every 6 (six) hours as needed for mild pain (pain score 1-3) (or temp > 100.5). Coralyn Derry, PA-C  Active Child  B Complex-C (B-COMPLEX WITH VITAMIN C) tablet [id number] Yes Take 1 tablet by mouth daily.  [provider]  Active Child  calcium  carbonate (OS-CAL - DOSED IN MG OF ELEMENTAL CALCIUM ) 1250 (500 Ca) MG tablet [id number] Yes Take 1 tablet by mouth daily with  breakfast. [provider]  Active Child  carvedilol  (COREG ) 6.25 MG tablet [id number] Yes Take 1 tablet (6.25 mg total) by mouth 2 (two) times daily with a meal. Medina-Vargas, Monina C, NP  Active Child  cephALEXin (KEFLEX) 500 MG capsule [id number]  Take 1 capsule (500 mg total) by mouth 2 (two) times daily for 3 days. Ezzard Holms, MD  Active   cholecalciferol  (VITAMIN D ) 25 MCG (1000 UT) tablet [id number]  Take 1,000 Units by mouth at bedtime.  Patient not taking: Reported on 11/17/2023   [provider]  Active Child  Cranberry 1000 MG CAPS [id number] Yes Take 1,000 mg by mouth daily.  [provider]  Active Child  ezetimibe  (ZETIA ) 10 MG tablet [id number] Yes Take 1 tablet (10 mg total) by mouth daily. Medina-Vargas, Monina C, NP  Active Child  fluticasone  (FLOVENT  HFA) 110 MCG/ACT inhaler [id number] Yes Inhale 1 puff into the lungs every 12 (twelve) hours as needed. Medina-Vargas, Monina C, NP  Active Child  gabapentin  (NEURONTIN ) 300 MG capsule [id number] Yes Take 1 capsule (300 mg total)  by mouth 3 (three) times daily. Give 3 capsules by mouth at bedtime. Medina-Vargas, Monina C, NP  Active Child           Med Note Jimmy Moulding   Tue Nov 14, 2023  7:05 AM) 1 morning, noon, and at 5pm and 3 at bedtime  loratadine (CLARITIN) 10 MG tablet [id number] Yes Take 10 mg by mouth daily as needed for allergies. [provider]  Active Child  Multiple Vitamins-Minerals (MULTIVITAMIN WITH MINERALS) tablet [id number] Yes Take 1 tablet by mouth daily. Centrum Silver [provider]  Active Child  ondansetron  (ZOFRAN ) 4 MG tablet [id number] Yes Take 1 tablet (4 mg total) by mouth every 6 (six) hours as needed for nausea. Medina-Vargas, Monina C, NP  Active Child  polyethylene glycol (MIRALAX  / GLYCOLAX ) 17 g packet [id number]  Take 17 g by mouth daily as needed. [provider]  Active Child  sacubitril -valsartan  (ENTRESTO ) 24-26 MG [id number] Yes TAKE 1 TABLET  BY MOUTH TWICE A DAY Medina-Vargas, Monina C, NP  Active Child  spironolactone  (ALDACTONE ) 25 MG tablet [id number] Yes Take 1 tablet (25 mg total) by mouth daily. Medina-Vargas, Monina C, NP  Active Child  traMADol  (ULTRAM ) 50 MG tablet [id number]  Take 1 tablet (50 mg total) by mouth every 6 (six) hours as needed for moderate pain (pain score 4-6).  Patient not taking: Reported on 11/17/2023   Medina-Vargas, Monina C, NP  Active Child  Turmeric 500 MG CAPS [id number] Yes Take 500 mg by mouth daily.  [provider]  Active Child            Recommendation:   -Acute PCP follow-up : scheduled with Dr. Gala Jubilee 11/21/23 for post-hosp f/u. . -Expecting to receive home health PT/OT per hospital discharge notes.  -This RNCM will try to contact son, Nhu Glasby, for concerns at home regarding patient. Reach out this morning but was not able to leave a voicemail.   Follow Up Plan:   Telephone follow-up in 1 week  Jurline Olmsted BSN, CCM [place]  [facility] Population Health RN Care Manager Direct Dial: [phone]  Fax: [phone]

## 2023-11-17 NOTE — Patient Outreach (Signed)
 Attempted to reach patient's son, Audrey Martinez, to check if he has heard from agency for home health PT/OT ordered at hospital visit 6/18.  Does he have any other needs/concerns. Not able to leave a voicemail, will try again later.

## 2023-11-21 ENCOUNTER — Inpatient Hospital Stay: Admitting: Internal Medicine

## 2023-11-23 ENCOUNTER — Other Ambulatory Visit: Payer: Self-pay

## 2023-11-23 NOTE — Patient Instructions (Signed)
 Visit Information  Thank you for taking time to visit with me today. Please don't hesitate to contact me if I can be of assistance to you before our next scheduled appointment.   I have attached an education sheet, UNDERSTANDING YOUR RISK FOR FALLS.    Your next care management appointment is by telephone on Thursday, July 10th at 10:00am.   Please call the care guide team at [phone] if you need to cancel, schedule, or reschedule an appointment.   A reminder to ALL patients/family/friends please call the [place]  National Suicide Prevention Lifeline: 256-697-0825 or TTY: (832) 569-5304 TTY (925) 561-0407) to talk to a trained counselor if you are experiencing a Mental Health or Behavioral Health Crisis or need someone to talk to.  Santana Stamp BSN, CCM [place]  [facility] Population Health RN Care Manager Direct Dial: [phone]  Fax: [phone]

## 2023-11-23 NOTE — Patient Outreach (Addendum)
 Complex Care Management   Visit Note  11/23/2023  Name:  Audrey Martinez MRN: [id number] DOB: July 06, 1945  Situation: Referral received for Complex Care Management related to Dementia and Fall Risk I obtained verbal consent from Caregiver.  Visit completed with Richardean Rummer, son  on the phone.  Main concern today is cognitive decline of patient as reported by son.  States memory is getting worse, she is mixing up days, not properly taking her medications, moving heavy items she shouldn't be moving which injured her back, not showering/grooming, and he is finding evidence of episodes of bowel and bladder incontinence. He is concerned about her being at home alone during the day but he has to work.   Background:   Past Medical History:  Diagnosis Date   Actinic keratosis    Acute renal failure (ARF) (HCC) 01/31/2020   Acute respiratory failure with hypoxia (HCC) 01/29/2020   Amaurosis fugax, right eye 12/2018   Aortic atherosclerosis (HCC)    Arthritis    Asthma    Atrial tachycardia (HCC)    a.) s/p RFCA for AVNRT in 2008 at [facility] [place]    Basal cell carcinoma 07/2016   central forehead (tx in [place] )   Bilateral pulmonary embolism (HCC) 01/28/2020   a.) in setting of SARS-CoV-2 infection (12/2019); noted BILATERALLY with (+) associated RIGHT heart strain; Tx'd with rivaroxaban  Xarelto  20 mg daily x 6 months then PRN   Cerebellar infarct (RIGHT) 1998   Coronary artery disease    DDD (degenerative disc disease), cervical    Dyspnea    Facial laceration 06/16/2022   Date of injury: 06/08/2022  [facility] ED history below:  78 year old female presenting to the emergency department after mechanical, nonsyncopal fall causing her to strike her face on the edge/corner of a curio cabinet and sustaining a deep and approximate 8 cm laceration to the right cheek in an irregular pattern to the angle of the right lower jaw. Wound does not proceed th    GERD (gastroesophageal reflux disease)    Herniated disc, lumbar    HFrEF (heart failure with reduced ejection fraction) (HCC)    History of 2019 novel coronavirus disease (COVID-19) 12/2019   History of bilateral cataract extraction    Hyperlipidemia    Incomplete left bundle branch block (LBBB)    Left wrist fracture    Lumbar stenosis    s/p LEFT L2-4 hemilaminectomies and partial facetectomies, foraminotomies for lateral recess and foraminal decompression of L3 and L4 nerve roots   Melanoma (HCC) 05/12/2020   Melanoma IS R post neck, excised 08/10/20   Mobitz type 1 second degree atrioventricular block    a.) evaluated by EP --> paroxysmal in nature; nocturnal epidoe felt to be vagally mediated   NICM (nonischemic cardiomyopathy) (HCC)    a. 04/2018 Echo: EF 45-50%. Anteroseptal and apical HK in some views. Mild AI/MR. Nl RV fxn; b. 05/2018 MV: mid-dist ant and antsept mild defect - ? breast atten. EF 41%. No ischemia; c. 03/2019 Echo: EF 30-35%, Gr2 DD, Trace MR, triv TR. Asc Ao ectatic dil - 38mm.    Osteoporosis    Personal history of transient ischemic attack (TIA), and cerebral infarction without residual deficits 04/22/2003   Pneumonia due to COVID-19 virus 01/26/2020   SVT (supraventricular tachycardia) (HCC)    a.2008 s/p RFCA for AVNRT; b. 9 & 02/2019 Zio x 2: 1. Avg HR 89, 3 runs NSVT (longest 7 beats), Mobitz 1. 2. Avg HR 89, NSVT x  1 (5 beats), Mobitz 1. No signif arrhythmia.   Tendinitis of right knee 07/03/2017    Assessment: Patient Reported Symptoms:  Cognitive Cognitive Status: Poor judgment in daily scenarios (Son, Richardean, reports poor judgement, moving items she shouldn't be moving, taking medications at the wrong time.) Cognitive/Intellectual Conditions Management [RPT]: Other Other: Mild Dementia per history in EHR      Neurological Neurological Review of Symptoms: Not assessed    HEENT HEENT Symptoms Reported: Not assessed      Cardiovascular  Cardiovascular Symptoms Reported: Not assessed    Respiratory Respiratory Symptoms Reported: Not assesed    Endocrine Patient reports the following symptoms related to hypoglycemia or hyperglycemia : Not assessed    Gastrointestinal Gastrointestinal Symptoms Reported: Other Other Gastrointestinal Symptoms: Son, Richardean, reports some bowel incontinence.      Genitourinary Genitourinary Symptoms Reported: Other Other Genitourinary Symptoms: Son, Richardean, reports some urine incontinence    Integumentary Integumentary Symptoms Reported: Not assessed    Musculoskeletal Musculoskelatal Symptoms Reviewed: Not assessed        Psychosocial Psychosocial Symptoms Reported: Not assessed            11/16/2023    2:53 PM  Depression screen PHQ 2/9  Decreased Interest 0  Down, Depressed, Hopeless 0  PHQ - 2 Score 0    There were no vitals filed for this visit.  Medications Reviewed Today   Medications were not reviewed in this encounter     Recommendation:   -Referral to: GUIDE program, called [facility] Cong, sent referral by email. I also sent son, Statia Burdick, secure email with Laura's contact information to set up appointment.  -This RNCM will be available for son to contact for any questions/concerns while interviewing with GUIDE assessment staff.   Follow Up Plan:   Telephone follow-up two weeks.   Santana Stamp BSN, CCM [place]  [facility] Population Health RN Care Manager Direct Dial: [phone]  Fax: [phone]

## 2023-11-28 ENCOUNTER — Telehealth: Payer: Self-pay

## 2023-11-28 DIAGNOSIS — F02A0 Dementia in other diseases classified elsewhere, mild, without behavioral disturbance, psychotic disturbance, mood disturbance, and anxiety: Secondary | ICD-10-CM | POA: Diagnosis not present

## 2023-11-28 NOTE — Patient Outreach (Signed)
 This RNCM received an email yesterday afternoon from [facility] GUIDE program regarding Ms. Robert's back pain.  I reached out to Dr. Christene office at [facility] and left a message for recommendation of a mobile xray. Left my contact info for return call.     Good afternoon Sentoria Brent, I hope your week is off to a good start. I wanted to circle back and let you know I assessed Ms. Ebonye for the GUIDE program today with her son Richardean present. In going over medical history, she mentioned she had a total hip replacement in May and was supposed to receive PT as part of her recovery, but no one ever contacted her about it. So, I reached out to the orthopedic surgeon, Arthea Sheer and spoke with his nurse who is working on obtaining an order for PT.   Also during our assessment, Richardean mentioned Deania recently injured her back picking up a 30lb bag of dog food and as a result, she has had significant back pain that they are treating with left over Tramadol  from her hip replacement surgery. Richardean said he wanted to get her back injury evaluated, but with her current injury, she's unable to walk and he didn't know if he could get her into the car. He debated on calling EMS, but I told them I would reach out to you and see if you would be able to facilitate a mobile x-ray order. Please advise if you are able to obtain an order for this and if not, I will see if our physical therapist can evaluate the injury when she goes out there for a PT session.   Thanks,  Laura  Laura Foy Care Navigator-[facility] [phone]

## 2023-11-28 NOTE — Telephone Encounter (Signed)
 Spoke with Santana Stamp from [facility] and informed her that we do not know of a mobile xray facility. I can place a order for xray but I need to know is it mid back or low back. She will call patient or patient son and get more info and call us  back.  JM   Copied from CRM [id number]. Topic: Clinical - Medical Advice >> Nov 28, 2023  8:14 AM Powell HERO wrote: Reason for CRM: Patient has been set up with [facility] guide program . For people with dementia. She had an evaluation and the nurse who was out there suggested that she should get some sort of mobile xray for her back. [facility] Nurse case manager requesting to speak with the office in regard to this patient, waited on hold for [facility], they refused the call stated it was not an emergency. Told to send CRM. Advised caller no one was available. She included more information, patient moved a dog food bag and now she is having trouble moving around. Having back pain, caller was not with the patient. Please call back ASAP per [facility]  [phone].

## 2023-11-28 NOTE — Patient Outreach (Signed)
 Contacted staff at [facility] [place], requested home health physical therapy referral which was ordered at hospitalization discharge 11/15/23.  Son, Audrey Martinez,  states no one has called to visit for evaluation.  Regarding previous request for mobile xray in patient's home due to acute back pain which was caused by moving a heavy bag of dog food, son states she is doing better, she is able to get up and move around, going to the bathroom on her own, not complaining as much as she was but still feels some pain in her back.  He continues to decline sending her to ED by EMS.  This RNCM has been unable to reach [name] by phone to speak to her.

## 2023-11-28 NOTE — Telephone Encounter (Signed)
 Pt needs appt/ hospital follow up before we can place any orders. Pls call to schedule. May have to schedule with son Richardean who is in patient contacts.

## 2023-11-28 NOTE — Telephone Encounter (Signed)
 Spoke with Santana Stamp from [facility] and informed her that we do not know of a mobile xray facility. I can place a order for xray but I need to know is it mid back or low back. She will call patient or patient son and get more info and call us  back.  JM    Copied from CRM [id number]. Topic: Clinical - Red Word Triage >> Nov 28, 2023  8:23 AM Powell HERO wrote: Red Word that prompted transfer to Nurse Triage: [facility] calling on behalf of patient, and stated patient is in pain in her back and having trouble walking to due lifting a heavy dog food bag.Was not with the patient to speak with triage.

## 2023-11-29 ENCOUNTER — Encounter: Payer: Self-pay | Admitting: Internal Medicine

## 2023-11-29 ENCOUNTER — Ambulatory Visit (INDEPENDENT_AMBULATORY_CARE_PROVIDER_SITE_OTHER): Admitting: Internal Medicine

## 2023-11-29 ENCOUNTER — Ambulatory Visit: Payer: Self-pay | Admitting: Internal Medicine

## 2023-11-29 ENCOUNTER — Ambulatory Visit
Admission: RE | Admit: 2023-11-29 | Discharge: 2023-11-29 | Disposition: A | Discharge status: Home / Self Care | Source: Ambulatory Visit | Attending: Internal Medicine | Admitting: Internal Medicine

## 2023-11-29 ENCOUNTER — Ambulatory Visit
Admission: RE | Admit: 2023-11-29 | Discharge: 2023-11-29 | Disposition: A | Discharge status: Home / Self Care | Attending: Internal Medicine | Admitting: Internal Medicine

## 2023-11-29 VITALS — BP 102/74 | HR 83 | Ht 62.0 in | Wt 195.0 lb

## 2023-11-29 DIAGNOSIS — Z96651 Presence of right artificial knee joint: Secondary | ICD-10-CM | POA: Diagnosis not present

## 2023-11-29 DIAGNOSIS — Z9089 Acquired absence of other organs: Secondary | ICD-10-CM | POA: Diagnosis not present

## 2023-11-29 DIAGNOSIS — M545 Low back pain, unspecified: Secondary | ICD-10-CM | POA: Diagnosis not present

## 2023-11-29 DIAGNOSIS — J45909 Unspecified asthma, uncomplicated: Secondary | ICD-10-CM | POA: Diagnosis not present

## 2023-11-29 DIAGNOSIS — K219 Gastro-esophageal reflux disease without esophagitis: Secondary | ICD-10-CM | POA: Diagnosis not present

## 2023-11-29 DIAGNOSIS — E7800 Pure hypercholesterolemia, unspecified: Secondary | ICD-10-CM | POA: Diagnosis not present

## 2023-11-29 DIAGNOSIS — I471 Supraventricular tachycardia, unspecified: Secondary | ICD-10-CM | POA: Diagnosis not present

## 2023-11-29 DIAGNOSIS — M1611 Unilateral primary osteoarthritis, right hip: Secondary | ICD-10-CM | POA: Diagnosis not present

## 2023-11-29 DIAGNOSIS — G629 Polyneuropathy, unspecified: Secondary | ICD-10-CM | POA: Diagnosis not present

## 2023-11-29 DIAGNOSIS — I5020 Unspecified systolic (congestive) heart failure: Secondary | ICD-10-CM | POA: Diagnosis not present

## 2023-11-29 MED ORDER — TRAMADOL HCL 50 MG PO TABS: 50.0000 mg | ORAL_TABLET | Freq: Three times a day (TID) | ORAL | 0 refills | Status: AC | PRN

## 2023-11-29 MED ORDER — SPIRONOLACTONE 25 MG PO TABS: 25.0000 mg | ORAL_TABLET | Freq: Every day | ORAL | 0 refills | Status: AC

## 2023-11-29 NOTE — Assessment & Plan Note (Signed)
Needs refill on spironolactone.

## 2023-11-29 NOTE — Progress Notes (Signed)
 Date:  11/29/2023   Name:  Audrey Martinez   DOB:  [birth year]   MRN:  [id number]   Chief Complaint: Hospitalization Follow-up (Pt hurt her back trying to lift heavy dog food X2 weeks ago. Tramadol  is helping some. Sharp pain, 9 on the pain scale ) Admitted to [facility] 11/13/23 with UTI and acute metabolic encephalopathy.  MS returned to baseline and she was discharged home under the care of her son.  She was to complete keflex  500 mg tid x 3 days. No culture was done. She had hip replacement in May - spent 2 weeks in rehab.  Now getting some home PT as well.  New complaint of back pain after picking up a bag of dog food. Hx of laminectomy in 2017.  Back Pain This is a recurrent problem. Episode onset: about 2 weeks. The problem occurs constantly. The pain is present in the lumbar spine. Pertinent negatives include no abdominal pain, chest pain or fever.    Review of Systems  Constitutional:  Negative for chills, fatigue and fever.  Respiratory:  Negative for chest tightness and shortness of breath.   Cardiovascular:  Negative for chest pain.  Gastrointestinal:  Negative for abdominal pain.  Musculoskeletal:  Positive for back pain and gait problem.  Psychiatric/Behavioral:  Positive for confusion and sleep disturbance. Negative for dysphoric mood. The patient is not nervous/anxious.      Lab Results  Component Value Date   NA 141 11/14/2023   K 3.5 11/14/2023   CO2 24 11/14/2023   GLUCOSE 101 (H) 11/14/2023   BUN 27 (H) 11/14/2023   CREATININE 0.94 11/14/2023   CALCIUM  9.1 11/14/2023   EGFR 70 10/16/2023   GFRNONAA >60 11/14/2023   Lab Results  Component Value Date   CHOL 175 06/26/2023   HDL 59 06/26/2023   LDLCALC 96 06/26/2023   TRIG 99 06/26/2023   CHOLHDL 3.0 06/26/2023   Lab Results  Component Value Date   TSH 2.630 06/24/2022   Lab Results  Component Value Date   HGBA1C 5.1 06/25/2023   Lab Results  Component Value Date   WBC 6.4 11/14/2023   HGB 12.6  11/14/2023   HCT 38.2 11/14/2023   MCV 93.4 11/14/2023   PLT 177 11/14/2023   Lab Results  Component Value Date   ALT 14 11/13/2023   AST 19 11/13/2023   ALKPHOS 69 11/13/2023   BILITOT 1.1 11/13/2023   Lab Results  Component Value Date   VD25OH 34.76 11/25/2019     Patient Active Problem List   Diagnosis Date Noted   UTI (urinary tract infection) 11/13/2023   Obesity, morbid (HCC) 10/16/2023   Headache 10/11/2023   Fever 10/11/2023   S/P total right hip arthroplasty 10/09/2023   Problem related to living alone 08/08/2023   TIA (transient ischemic attack) 06/25/2023   CAD (coronary artery disease) 06/25/2023   Chronic kidney disease, stage 3a (HCC) 06/25/2023   Age-related cognitive decline 06/15/2023   Encounter for long-term (current) use of non-steroidal anti-inflammatories 06/15/2023   Personal history of PE (pulmonary embolism) 06/15/2023   Cause of injury, accidental fall, subsequent encounter 06/15/2023   Problem related to health literacy 06/15/2023   Personal history of fall 06/15/2023   Osteoarthritis of right hip joint due to dysplasia 03/14/2023   Osteoarthritis of right hip 03/14/2023   Constipation 12/16/2022   HFrEF (heart failure with reduced ejection fraction) (HCC) 06/24/2022   Melanoma in situ of neck (HCC) 06/22/2021   Trochanteric bursitis  of right hip 05/18/2021   Hx of colonic polyps    Atrial flutter (HCC) 01/31/2020   Obesity (BMI 30-39.9) 01/31/2020   Left lower lobe pulmonary nodule 01/29/2020   Dilated cardiomyopathy (HCC) 05/02/2019   Amaurosis fugax of right eye 01/13/2019   Lumbar radiculopathy 05/18/2017   Lumbar degenerative disc disease 05/18/2017   Spinal stenosis of lumbar region without neurogenic claudication 05/18/2017   Atrial tachycardia (HCC) 04/14/2017   SVT (supraventricular tachycardia) (HCC) 04/14/2017   Essential hypertension 04/14/2017   Intermittent asthma without complication 04/14/2017   Osteoporosis 03/21/2017    Benign essential tremor 03/21/2017   Closed fracture of proximal end of left fibula 03/10/2015   Pain of right hip joint 12/10/2013   History of arthroscopy of shoulder 09/14/2011   Hx of atrioventricular node ablation 02/01/2008   Controlled mild persistent asthma 06/04/2004   Mixed hyperlipidemia 05/12/2003    Allergies  Allergen Reactions   Amoxicillin Hives, Shortness Of Breath and Swelling    Has tolerated a 1st generation cephalosporin (CEFAZOLIN) in the past without documented ADRs.    Aspirin Anaphylaxis and Shortness Of Breath   Bacitracin-Polymyx-Neo-Hc [Bacitra-Neomycin-Polymyxin-Hc] Rash   Ciprofloxacin Rash   Ciprofloxacin-Dexamethasone  Anaphylaxis   Crestor  [Rosuvastatin ] Other (See Comments)    Caused problems with memory.   Erythromycin Hives and Rash   Hydrochlorothiazide Other (See Comments) and Palpitations    Raises her blood pressure.    Ibuprofen Other (See Comments)    Sharp stomach pains   Morphine  And Codeine Nausea And Vomiting   Morpholine Salicylate Nausea Only   Omeprazole Palpitations and Other (See Comments)    Tachycardia and same sensation as her SVTs per patient   Doxycycline Hyclate Hives   Keflex  [Cephalexin ] Hives    Has tolerated a 1st generation cephalosporin (CEFAZOLIN) in the past without documented ADRs.     Lovastatin Other (See Comments)    Unable to function   Simvastatin Other (See Comments)    Unable to function   Bacitracin    Dexamethasone     Silicone Dermatitis and Hives   Neomycin-Bacitracin Zn-Polymyx Hives and Rash   Tape Hives and Rash    Past Surgical History:  Procedure Laterality Date   APPENDECTOMY     BREAST CYST ASPIRATION Left 1977   CARDIAC CATHETERIZATION  2009   [facility] [place] [facility]   CARDIAC ELECTROPHYSIOLOGY STUDY AND ABLATION  2009   CATARACT EXTRACTION, BILATERAL  2010   COLONOSCOPY WITH PROPOFOL  N/A 03/21/2018   Procedure: COLONOSCOPY WITH Biopsy;  Surgeon: Unk Corinn Skiff, MD;  Location: [facility];  Service: Endoscopy;  Laterality: N/A;   COLONOSCOPY WITH PROPOFOL  N/A 05/03/2021   Procedure: COLONOSCOPY WITH PROPOFOL ;  Surgeon: Unk Corinn Skiff, MD;  Location: [facility] ENDOSCOPY;  Service: Gastroenterology;  Laterality: N/A;   CORONARY ANGIOPLASTY     DARRACH'S PROCEDURE Bilateral 1977   ESOPHAGOGASTRODUODENOSCOPY  2013   gastritis; done for complaint of dysphagia   ESOPHAGOGASTRODUODENOSCOPY (EGD) WITH PROPOFOL  N/A 03/21/2018   Procedure: ESOPHAGOGASTRODUODENOSCOPY (EGD) WITH Biopsies;  Surgeon: Unk Corinn Skiff, MD;  Location: [facility];  Service: Endoscopy;  Laterality: N/A;  KEEP THIS PATIENT FIRST   JOINT REPLACEMENT     LEFT HEART CATH AND CORONARY ANGIOGRAPHY Left 05/02/2019   Procedure: LEFT HEART CATH AND CORONARY ANGIOGRAPHY;  Surgeon: Perla Evalene PARAS, MD;  Location: [facility] INVASIVE CV LAB;  Service: Cardiovascular;  Laterality: Left;   LUMBAR LAMINECTOMY  2017   Procedure: LUMBAR LAMINECTOMY (L2-L4)   LYMPH NODE REMOVAL (NECK)  MOHS SURGERY  07/2016   BCCA nose   POLYPECTOMY N/A 03/21/2018   Procedure: POLYPECTOMY;  Surgeon: Unk Corinn Skiff, MD;  Location: [facility];  Service: Endoscopy;  Laterality: N/A;   REPLACEMENT TOTAL KNEE Right 2013   TONSILLECTOMY     TOTAL HIP ARTHROPLASTY Right 10/09/2023   Procedure: ARTHROPLASTY, HIP, TOTAL, ANTERIOR APPROACH;  Surgeon: Lorelle Hussar, MD;  Location: [facility] ORS;  Service: Orthopedics;  Laterality: Right;   TOTAL SHOULDER REPLACEMENT Right 2012    Social History   Tobacco Use   Smoking status: Never   Smokeless tobacco: Never  Vaping Use   Vaping status: Never Used  Substance Use Topics   Alcohol use: Not Currently   Drug use: No     Medication list has been reviewed and updated.  Current Meds  Medication Sig   acetaminophen  (TYLENOL ) 325 MG tablet Take 1-2 tablets (325-650 mg total) by mouth every 6 (six) hours as needed for mild pain  (pain score 1-3) (or temp > 100.5).   B Complex-C (B-COMPLEX WITH VITAMIN C) tablet Take 1 tablet by mouth daily.    calcium  carbonate (OS-CAL - DOSED IN MG OF ELEMENTAL CALCIUM ) 1250 (500 Ca) MG tablet Take 1 tablet by mouth daily with breakfast.   carvedilol  (COREG ) 6.25 MG tablet Take 1 tablet (6.25 mg total) by mouth 2 (two) times daily with a meal. Please call [phone] to schedule an appointment with Dr. Timothy Gollan for future refills. Thank you. 1st attempt.   cholecalciferol  (VITAMIN D ) 25 MCG (1000 UT) tablet Take 1,000 Units by mouth at bedtime.   Cranberry 1000 MG CAPS Take 1,000 mg by mouth daily.    ezetimibe  (ZETIA ) 10 MG tablet Take 1 tablet (10 mg total) by mouth daily.   fluticasone  (FLOVENT  HFA) 110 MCG/ACT inhaler Inhale 1 puff into the lungs every 12 (twelve) hours as needed.   gabapentin  (NEURONTIN ) 300 MG capsule Take 1 capsule (300 mg total) by mouth 3 (three) times daily. Give 3 capsules by mouth at bedtime.   Multiple Vitamins-Minerals (MULTIVITAMIN WITH MINERALS) tablet Take 1 tablet by mouth daily. Centrum Silver   ondansetron  (ZOFRAN ) 4 MG tablet Take 1 tablet (4 mg total) by mouth every 6 (six) hours as needed for nausea.   polyethylene glycol (MIRALAX  / GLYCOLAX ) 17 g packet Take 17 g by mouth daily as needed.   sacubitril -valsartan  (ENTRESTO ) 24-26 MG TAKE 1 TABLET BY MOUTH TWICE A DAY   traMADol  (ULTRAM ) 50 MG tablet Take 1 tablet (50 mg total) by mouth every 6 (six) hours as needed for moderate pain (pain score 4-6).   traMADol  (ULTRAM ) 50 MG tablet Take 1 tablet (50 mg total) by mouth every 8 (eight) hours as needed for up to 5 days.   Turmeric 500 MG CAPS Take 500 mg by mouth daily.    [DISCONTINUED] spironolactone  (ALDACTONE ) 25 MG tablet Take 1 tablet (25 mg total) by mouth daily.       11/29/2023    3:58 PM 07/06/2023    8:30 AM 06/24/2022    8:12 AM 03/28/2022   10:39 AM  GAD 7 : Generalized Anxiety Score  Nervous, Anxious, on Edge 0 0 0 0   Control/stop worrying 0 0 0 0  Worry too much - different things 0 0 0 0  Trouble relaxing 0 0 0 0  Restless 0 0 0 0  Easily annoyed or irritable 0 0 0 0  Afraid - awful might happen 0 0 0 0  Total  GAD 7 Score 0 0 0 0  Anxiety Difficulty Not difficult at all Not difficult at all Not difficult at all Not difficult at all       11/29/2023    3:58 PM 11/16/2023    2:53 PM 11/10/2023    4:15 PM  Depression screen PHQ 2/9  Decreased Interest 0 0 0  Down, Depressed, Hopeless 0 0 0  PHQ - 2 Score 0 0 0  Altered sleeping 1  0  Tired, decreased energy 1  0  Change in appetite 0  0  Feeling bad or failure about yourself  0  0  Trouble concentrating 0  1  Moving slowly or fidgety/restless 0  0  Suicidal thoughts 0  0  PHQ-9 Score 2  1  Difficult doing work/chores Not difficult at all  Not difficult at all    BP Readings from Last 3 Encounters:  11/29/23 102/74  11/15/23 (!) 148/73  11/10/23 110/72    Physical Exam Constitutional:      Appearance: Normal appearance.  Cardiovascular:     Rate and Rhythm: Normal rate and regular rhythm.     Heart sounds: No murmur heard. Pulmonary:     Breath sounds: No wheezing or rhonchi.  Musculoskeletal:     Cervical back: Normal range of motion.     Lumbar back: Bony tenderness present. Decreased range of motion. Negative right straight leg raise test and negative left straight leg raise test.     Right lower leg: No edema.     Left lower leg: No edema.     Comments: Walks slowly but steadily with walker  Lymphadenopathy:     Cervical: No cervical adenopathy.  Neurological:     Mental Status: She is alert.  Psychiatric:        Attention and Perception: Attention normal.        Mood and Affect: Mood is not anxious or depressed.        Speech: Speech normal.        Cognition and Memory: She exhibits impaired recent memory.     Wt Readings from Last 3 Encounters:  11/29/23 195 lb (88.5 kg)  11/13/23 199 lb 4.7 oz (90.4 kg)  11/10/23  199 lb 4 oz (90.4 kg)    BP 102/74   Pulse 83   Ht 5' 2 (1.575 m)   Wt 195 lb (88.5 kg)   SpO2 97%   BMI 35.67 kg/m   Assessment and Plan:  Problem List Items Addressed This Visit       Unprioritized   HFrEF (heart failure with reduced ejection fraction) (HCC) (Chronic)   Relevant Medications   spironolactone  (ALDACTONE ) 25 MG tablet   Other Visit Diagnoses       Acute midline low back pain without sciatica    -  Primary   hx of lumbar laminectomy now with pain over L3-4 region- r/o compression fx Tramadol  50 mg tid   Relevant Medications   traMADol  (ULTRAM ) 50 MG tablet   Other Relevant Orders   DG Lumbar Spine 2-3 Views       No follow-ups on file.    Leita HILARIO Adie, MD [facility] [place]

## 2023-11-29 NOTE — Telephone Encounter (Signed)
 Reported findings of subacute vs chronic mild compression fractures L1-3.  Recommend pain management with Tramadol  tid.  Activity as tolerated, use walker to ambulate.  Can still participate with PT.  Not likely a candidate for Kyphoplasty. He requests a handicapped parking application - I will complete and he can pick it up tomorrow.

## 2023-12-04 ENCOUNTER — Ambulatory Visit: Payer: Self-pay | Admitting: Internal Medicine

## 2023-12-04 NOTE — Telephone Encounter (Signed)
 FYI Only or Action Required?: Action required by provider: update on patient condition.  Patient was last seen in primary care on 11/29/2023 by Audrey Leita DEL, MD. Called Nurse Triage reporting Back Pain. Symptoms began several days ago. Interventions attempted: traMADol  (ULTRAM ) 50 MG tablet . Symptoms are: gradually worsening.  Triage Disposition: See HCP Within 4 Hours (Or PCP Triage)  Patient/caregiver understands and will follow disposition?: No, wishes to speak with PCP             Copied from CRM [id number]. Topic: Clinical - Red Word Triage >> Dec 04, 2023 11:34 AM Emylou G wrote: [facility] that prompted transfer to Nurse Triage: Severe Pain.. Tramadol  isn't working.. Pain is in the back ( xrays shows compound fractures from last week ) Reason for Disposition  [1] SEVERE back pain (e.g., excruciating, unable to do any normal activities) AND [2] not improved 2 hours after pain medicine  Answer Assessment - Initial Assessment Questions Spoke with pt's son, Audrey Martinez. Pt was seen in office last week. Per pt son, the tramadol  50 mg is not working and pt son called EMS over the weekend for pt due to severe pain. Pt son states EMS talked pt out of going to hospital and stated she would have to wait a long time in waiting room. Pt son calling in to see if a different medication could be prescribed for pt's pain level. Pharmacy is:  [facility] [phone] [address], [place] - [address]  [address] [place], [place] [address]    Pt son, Audrey Martinez, call back number is [phone]     ONSET: When did the pain begin?      A couple of weeks ago when pulling a bag of dog food that was delivered; pt recently had hip surgery LOCATION: Where does it hurt? (upper, mid or lower back)     Lower back; pt has compound fractures in spine per x-rays SEVERITY: How bad is the pain?  (e.g., Scale 1-10; mild, moderate, or severe)   - MILD (1-3): Doesn't interfere with normal activities.    - MODERATE (4-7):  Interferes with normal activities or awakens from sleep.    - SEVERE (8-10): Excruciating pain, unable to do any normal activities.      8-9/10 pain level; when not moving 5/10 pain level PATTERN: Is the pain constant? (e.g., yes, no; constant, intermittent)      Constant, pain level fluctuates  RADIATION: Does the pain shoot into your legs or somewhere else?     No pain radiation; right hip is sore where she had surgery MEDICINES: What have you taken so far for the pain? (e.g., nothing, acetaminophen , NSAIDS)     Tramadol   Protocols used: Back Pain-A-AH

## 2023-12-05 ENCOUNTER — Other Ambulatory Visit: Payer: Self-pay | Admitting: Internal Medicine

## 2023-12-05 DIAGNOSIS — M545 Low back pain, unspecified: Secondary | ICD-10-CM

## 2023-12-05 MED ORDER — HYDROCODONE-ACETAMINOPHEN 5-325 MG PO TABS
1.0000 | ORAL_TABLET | Freq: Four times a day (QID) | ORAL | 0 refills | Status: AC | PRN
Start: 2023-12-05 — End: 2023-12-10

## 2023-12-05 NOTE — Telephone Encounter (Signed)
Pamelia Hoit informed

## 2023-12-05 NOTE — Progress Notes (Unsigned)
 Date:  12/05/2023   Name:  Audrey Martinez   DOB:  01-21-1946   MRN:  [id number]   Chief Complaint: No chief complaint on file.  HPI  Review of Systems   Lab Results  Component Value Date   NA 141 11/14/2023   K 3.5 11/14/2023   CO2 24 11/14/2023   GLUCOSE 101 (H) 11/14/2023   BUN 27 (H) 11/14/2023   CREATININE 0.94 11/14/2023   CALCIUM  9.1 11/14/2023   EGFR 70 10/16/2023   GFRNONAA >60 11/14/2023   Lab Results  Component Value Date   CHOL 175 06/26/2023   HDL 59 06/26/2023   LDLCALC 96 06/26/2023   TRIG 99 06/26/2023   CHOLHDL 3.0 06/26/2023   Lab Results  Component Value Date   TSH 2.630 06/24/2022   Lab Results  Component Value Date   HGBA1C 5.1 06/25/2023   Lab Results  Component Value Date   WBC 6.4 11/14/2023   HGB 12.6 11/14/2023   HCT 38.2 11/14/2023   MCV 93.4 11/14/2023   PLT 177 11/14/2023   Lab Results  Component Value Date   ALT 14 11/13/2023   AST 19 11/13/2023   ALKPHOS 69 11/13/2023   BILITOT 1.1 11/13/2023   Lab Results  Component Value Date   VD25OH 34.76 11/25/2019     Patient Active Problem List   Diagnosis Date Noted   UTI (urinary tract infection) 11/13/2023   Obesity, morbid (HCC) 10/16/2023   Headache 10/11/2023   Fever 10/11/2023   S/P total right hip arthroplasty 10/09/2023   Problem related to living alone 08/08/2023   TIA (transient ischemic attack) 06/25/2023   CAD (coronary artery disease) 06/25/2023   Chronic kidney disease, stage 3a (HCC) 06/25/2023   Age-related cognitive decline 06/15/2023   Encounter for long-term (current) use of non-steroidal anti-inflammatories 06/15/2023   Personal history of PE (pulmonary embolism) 06/15/2023   Cause of injury, accidental fall, subsequent encounter 06/15/2023   Problem related to health literacy 06/15/2023   Personal history of fall 06/15/2023   Osteoarthritis of right hip joint due to dysplasia 03/14/2023   Osteoarthritis of right hip 03/14/2023   Constipation  12/16/2022   HFrEF (heart failure with reduced ejection fraction) (HCC) 06/24/2022   Melanoma in situ of neck (HCC) 06/22/2021   Trochanteric bursitis of right hip 05/18/2021   Hx of colonic polyps    Atrial flutter (HCC) 01/31/2020   Obesity (BMI 30-39.9) 01/31/2020   Left lower lobe pulmonary nodule 01/29/2020   Dilated cardiomyopathy (HCC) 05/02/2019   Amaurosis fugax of right eye 01/13/2019   Lumbar radiculopathy 05/18/2017   Lumbar degenerative disc disease 05/18/2017   Spinal stenosis of lumbar region without neurogenic claudication 05/18/2017   Atrial tachycardia (HCC) 04/14/2017   SVT (supraventricular tachycardia) (HCC) 04/14/2017   Essential hypertension 04/14/2017   Intermittent asthma without complication 04/14/2017   Osteoporosis 03/21/2017   Benign essential tremor 03/21/2017   Closed fracture of proximal end of left fibula 03/10/2015   Pain of right hip joint 12/10/2013   History of arthroscopy of shoulder 09/14/2011   Hx of atrioventricular node ablation 02/01/2008   Controlled mild persistent asthma 06/04/2004   Mixed hyperlipidemia 05/12/2003    Allergies  Allergen Reactions   Amoxicillin Hives, Shortness Of Breath and Swelling    Has tolerated a 1st generation cephalosporin (CEFAZOLIN) in the past without documented ADRs.    Aspirin Anaphylaxis and Shortness Of Breath   Bacitracin-Polymyx-Neo-Hc [Bacitra-Neomycin-Polymyxin-Hc] Rash   Ciprofloxacin Rash   Ciprofloxacin-Dexamethasone  Anaphylaxis  Crestor  [Rosuvastatin ] Other (See Comments)    Caused problems with memory.   Erythromycin Hives and Rash   Hydrochlorothiazide Other (See Comments) and Palpitations    Raises her blood pressure.    Ibuprofen Other (See Comments)    Sharp stomach pains   Morphine  And Codeine Nausea And Vomiting   Morpholine Salicylate Nausea Only   Omeprazole Palpitations and Other (See Comments)    Tachycardia and same sensation as her SVTs per patient   Doxycycline Hyclate  Hives   Keflex  [Cephalexin ] Hives    Has tolerated a 1st generation cephalosporin (CEFAZOLIN) in the past without documented ADRs.     Lovastatin Other (See Comments)    Unable to function   Simvastatin Other (See Comments)    Unable to function   Bacitracin    Dexamethasone     Silicone Dermatitis and Hives   Neomycin-Bacitracin Zn-Polymyx Hives and Rash   Tape Hives and Rash    Past Surgical History:  Procedure Laterality Date   APPENDECTOMY     BREAST CYST ASPIRATION Left 1977   CARDIAC CATHETERIZATION  2009   [facility] [place] [facility]   CARDIAC ELECTROPHYSIOLOGY STUDY AND ABLATION  2009   CATARACT EXTRACTION, BILATERAL  2010   COLONOSCOPY WITH PROPOFOL  N/A 03/21/2018   Procedure: COLONOSCOPY WITH Biopsy;  Surgeon: Unk Corinn Skiff, MD;  Location: [facility];  Service: Endoscopy;  Laterality: N/A;   COLONOSCOPY WITH PROPOFOL  N/A 05/03/2021   Procedure: COLONOSCOPY WITH PROPOFOL ;  Surgeon: Unk Corinn Skiff, MD;  Location: [facility] ENDOSCOPY;  Service: Gastroenterology;  Laterality: N/A;   CORONARY ANGIOPLASTY     DARRACH'S PROCEDURE Bilateral 1977   ESOPHAGOGASTRODUODENOSCOPY  2013   gastritis; done for complaint of dysphagia   ESOPHAGOGASTRODUODENOSCOPY (EGD) WITH PROPOFOL  N/A 03/21/2018   Procedure: ESOPHAGOGASTRODUODENOSCOPY (EGD) WITH Biopsies;  Surgeon: Unk Corinn Skiff, MD;  Location: [facility];  Service: Endoscopy;  Laterality: N/A;  KEEP THIS PATIENT FIRST   JOINT REPLACEMENT     LEFT HEART CATH AND CORONARY ANGIOGRAPHY Left 05/02/2019   Procedure: LEFT HEART CATH AND CORONARY ANGIOGRAPHY;  Surgeon: Perla Evalene PARAS, MD;  Location: [facility] INVASIVE CV LAB;  Service: Cardiovascular;  Laterality: Left;   LUMBAR LAMINECTOMY  2017   Procedure: LUMBAR LAMINECTOMY (L2-L4)   LYMPH NODE REMOVAL (NECK)     MOHS SURGERY  07/2016   BCCA nose   POLYPECTOMY N/A 03/21/2018   Procedure: POLYPECTOMY;  Surgeon: Unk Corinn Skiff, MD;   Location: [facility];  Service: Endoscopy;  Laterality: N/A;   REPLACEMENT TOTAL KNEE Right 2013   TONSILLECTOMY     TOTAL HIP ARTHROPLASTY Right 10/09/2023   Procedure: ARTHROPLASTY, HIP, TOTAL, ANTERIOR APPROACH;  Surgeon: Lorelle Hussar, MD;  Location: [facility] ORS;  Service: Orthopedics;  Laterality: Right;   TOTAL SHOULDER REPLACEMENT Right 2012    Social History   Tobacco Use   Smoking status: Never   Smokeless tobacco: Never  Vaping Use   Vaping status: Never Used  Substance Use Topics   Alcohol use: Not Currently   Drug use: No     Medication list has been reviewed and updated.  No outpatient medications have been marked as taking for the 12/05/23 encounter (Orders Only) with [name] DEL, MD.       11/29/2023    3:58 PM 07/06/2023    8:30 AM 06/24/2022    8:12 AM 03/28/2022   10:39 AM  GAD 7 : Generalized Anxiety Score  Nervous, Anxious, on Edge 0 0 0 0  Control/stop worrying 0 0 0 0  Worry too much - different things 0 0 0 0  Trouble relaxing 0 0 0 0  Restless 0 0 0 0  Easily annoyed or irritable 0 0 0 0  Afraid - awful might happen 0 0 0 0  Total GAD 7 Score 0 0 0 0  Anxiety Difficulty Not difficult at all Not difficult at all Not difficult at all Not difficult at all       11/29/2023    3:58 PM 11/16/2023    2:53 PM 11/10/2023    4:15 PM  Depression screen PHQ 2/9  Decreased Interest 0 0 0  Down, Depressed, Hopeless 0 0 0  PHQ - 2 Score 0 0 0  Altered sleeping 1  0  Tired, decreased energy 1  0  Change in appetite 0  0  Feeling bad or failure about yourself  0  0  Trouble concentrating 0  1  Moving slowly or fidgety/restless 0  0  Suicidal thoughts 0  0  PHQ-9 Score 2  1  Difficult doing work/chores Not difficult at all  Not difficult at all    BP Readings from Last 3 Encounters:  11/29/23 102/74  11/15/23 (!) 148/73  11/10/23 110/72    Physical Exam  Wt Readings from Last 3 Encounters:  11/29/23 195 lb (88.5 kg)  11/13/23 199 lb  4.7 oz (90.4 kg)  11/10/23 199 lb 4 oz (90.4 kg)    There were no vitals taken for this visit.  Assessment and Plan:  Problem List Items Addressed This Visit   None   No follow-ups on file.    Leita HILARIO Adie, MD [facility] [place]

## 2023-12-06 DIAGNOSIS — E7800 Pure hypercholesterolemia, unspecified: Secondary | ICD-10-CM | POA: Diagnosis not present

## 2023-12-06 DIAGNOSIS — K219 Gastro-esophageal reflux disease without esophagitis: Secondary | ICD-10-CM | POA: Diagnosis not present

## 2023-12-06 DIAGNOSIS — J45909 Unspecified asthma, uncomplicated: Secondary | ICD-10-CM | POA: Diagnosis not present

## 2023-12-06 DIAGNOSIS — G629 Polyneuropathy, unspecified: Secondary | ICD-10-CM | POA: Diagnosis not present

## 2023-12-06 DIAGNOSIS — M1611 Unilateral primary osteoarthritis, right hip: Secondary | ICD-10-CM | POA: Diagnosis not present

## 2023-12-06 DIAGNOSIS — I471 Supraventricular tachycardia, unspecified: Secondary | ICD-10-CM | POA: Diagnosis not present

## 2023-12-07 ENCOUNTER — Other Ambulatory Visit: Payer: Self-pay

## 2023-12-08 DIAGNOSIS — J45909 Unspecified asthma, uncomplicated: Secondary | ICD-10-CM | POA: Diagnosis not present

## 2023-12-08 DIAGNOSIS — I471 Supraventricular tachycardia, unspecified: Secondary | ICD-10-CM | POA: Diagnosis not present

## 2023-12-08 DIAGNOSIS — M1611 Unilateral primary osteoarthritis, right hip: Secondary | ICD-10-CM | POA: Diagnosis not present

## 2023-12-08 DIAGNOSIS — K219 Gastro-esophageal reflux disease without esophagitis: Secondary | ICD-10-CM | POA: Diagnosis not present

## 2023-12-08 DIAGNOSIS — E7800 Pure hypercholesterolemia, unspecified: Secondary | ICD-10-CM | POA: Diagnosis not present

## 2023-12-08 DIAGNOSIS — G629 Polyneuropathy, unspecified: Secondary | ICD-10-CM | POA: Diagnosis not present

## 2023-12-09 ENCOUNTER — Emergency Department
Admission: EM | Admit: 2023-12-09 | Discharge: 2023-12-09 | Disposition: A | Discharge status: Home / Self Care | Attending: Emergency Medicine | Admitting: Emergency Medicine

## 2023-12-09 ENCOUNTER — Emergency Department

## 2023-12-09 ENCOUNTER — Other Ambulatory Visit: Payer: Self-pay

## 2023-12-09 DIAGNOSIS — S12090A Other displaced fracture of first cervical vertebra, initial encounter for closed fracture: Secondary | ICD-10-CM | POA: Diagnosis not present

## 2023-12-09 DIAGNOSIS — M47816 Spondylosis without myelopathy or radiculopathy, lumbar region: Secondary | ICD-10-CM | POA: Diagnosis not present

## 2023-12-09 DIAGNOSIS — M48061 Spinal stenosis, lumbar region without neurogenic claudication: Secondary | ICD-10-CM | POA: Diagnosis not present

## 2023-12-09 DIAGNOSIS — M545 Low back pain, unspecified: Secondary | ICD-10-CM | POA: Diagnosis present

## 2023-12-09 DIAGNOSIS — S32039K Unspecified fracture of third lumbar vertebra, subsequent encounter for fracture with nonunion: Secondary | ICD-10-CM | POA: Diagnosis not present

## 2023-12-09 DIAGNOSIS — S12091A Other nondisplaced fracture of first cervical vertebra, initial encounter for closed fracture: Secondary | ICD-10-CM | POA: Diagnosis not present

## 2023-12-09 DIAGNOSIS — X500XXA Overexertion from strenuous movement or load, initial encounter: Secondary | ICD-10-CM | POA: Diagnosis not present

## 2023-12-09 DIAGNOSIS — S12000A Unspecified displaced fracture of first cervical vertebra, initial encounter for closed fracture: Secondary | ICD-10-CM | POA: Diagnosis not present

## 2023-12-09 DIAGNOSIS — S32029K Unspecified fracture of second lumbar vertebra, subsequent encounter for fracture with nonunion: Secondary | ICD-10-CM | POA: Diagnosis not present

## 2023-12-09 MED ORDER — LIDOCAINE 5 % EX PTCH: 1.0000 | MEDICATED_PATCH | CUTANEOUS | 0 refills | Status: AC

## 2023-12-09 MED ORDER — METHOCARBAMOL 500 MG PO TABS: 500.0000 mg | ORAL_TABLET | Freq: Three times a day (TID) | ORAL | 0 refills | Status: AC | PRN

## 2023-12-09 MED ORDER — HYDROCODONE-ACETAMINOPHEN 5-325 MG PO TABS
1.0000 | ORAL_TABLET | Freq: Once | ORAL | Status: AC
  Administered 2023-12-09: 1 via ORAL
  Filled 2023-12-09: qty 1

## 2023-12-09 MED ORDER — HYDROCODONE-ACETAMINOPHEN 5-325 MG PO TABS: 1.0000 | ORAL_TABLET | Freq: Four times a day (QID) | ORAL | 0 refills | Status: DC | PRN

## 2023-12-09 NOTE — ED Notes (Signed)
 Pt able to ambulate with walker to BR. Instructed to pull call bell when done or if she needs help

## 2023-12-09 NOTE — ED Notes (Signed)
 Per ortho tech that the TLSO brace has been order

## 2023-12-09 NOTE — ED Triage Notes (Signed)
 Pt to ED from home with son for lower back pain since 1 month ago after injuring self pulling a heavy bag of dogfood. Was seen at a [facility] facility 2 weeks ago and diagnosed with compound fractures to lower back. Pt states is not getting better and pain is worse. Prescribed hydrocone and tramadol  are not working, took [place]. Pt is ambulatory with walker.

## 2023-12-09 NOTE — Progress Notes (Signed)
 Neurosurgery brief note  Was contacted by the emergency room regarding this patient.  She is a 78 year old female who approximately 3 weeks ago experienced acute onset of low back pain while lifting a bag of dog food she saw her primary care physician underwent an x-ray of her spine which demonstrated potential compression fracture at the level of L1 and subsequently presenting emergency room today for ongoing pain difficulties.  By report she is neurologically intact no bowel or bladder loss of function.  She primarily paining presenting for pain control.  CT scan demonstrates SOFT TISSUES: Atherosclerotic calcifications are present in the aorta and branch vessels without aneurysm. A 6.3 cm simple cyst is present posteriorly in the right kidney. Coronary artery calcifications are present.   IMPRESSION: 1. Subacute, incompletely healed L1 superior endplate compression fracture with 50% height loss and 5 mm retropulsion into the spinal canal. 2. Remote inferior endplate fracture at L2 and superior endplate fracture at L3. 3. Multilevel degenerative changes as detailed above, including severe left foraminal stenosis at L4-5 (stable) and moderate central canal stenosis at L4-5.   Electronically signed by: Lonni Necessary MD 12/09/2023 10:41 AM EDT RP Workstation: [id number]  AP: Patient does appear to have a compression fracture at the L1 superior endplate with some loss of height here though this is not requiring acute neurosurgical intervention. -TLSO brace for comfort mobilization as needed - Pain control per primary team. -We will arrange outpatient neurosurgery clinic follow-up.  Belvie PARAS. Deatrice, MD Neurosurgery

## 2023-12-09 NOTE — ED Notes (Signed)
 Ambulated back to room with walker from [facility]. Pt given a pillow for head and a pillow was placed back under her knees.

## 2023-12-09 NOTE — Progress Notes (Signed)
 Orthopedic Tech Progress Note Patient Details:  Audrey Martinez 07/13/1945 [id number] TLSO brace has been ordered from [facility]  Patient ID: Charlies Delayne Rummer, female   DOB: 04-30-1946, 78 y.o.   MRN: [id number]  Massie FORBES Bar 12/09/2023, 11:24 AM

## 2023-12-09 NOTE — ED Notes (Signed)
 Secretary called to contact vendor

## 2023-12-09 NOTE — ED Provider Notes (Signed)
  Physical Exam  BP 117/82 (BP Location: Left Wrist)   Pulse 96   Temp 98.1 F (36.7 C) (Oral)   Resp 20   Ht 5' 2 (1.575 m)   Wt 90.7 kg   SpO2 100%   BMI 36.58 kg/m   Physical Exam  Procedures  Procedures  ED Course / MDM   Clinical Course as of 12/09/23 1316  Sat Dec 09, 2023  1050 Paged. Dr. Maryrose (ortho) via his answering service. [MQ]  1312 Patient feeling better and ready for discharge at this time [DW]    Clinical Course User Index [DW] Malvina Alm DASEN, MD [MQ] Dicky Anes, MD   Medical Decision Making Amount and/or Complexity of Data Reviewed Radiology: ordered.  Risk Prescription drug management.   Received signout on patient.  78 year old female presenting today for low back pain.  Patient seen with initial provider and found to have compression fracture.  No neurological deficits.  Given Vicodin for pain control.  Follows with Dr. Deatrice outpatient with neurosurgery.  He recommended TLSO brace and outpatient follow-up.  Patient was signed out pending brace placement and reassessment of pain control.  Patient received TLSO brace and was feeling comfortable.  Able to ambulate.  Will discharge with ongoing pain control and neurosurgery follow-up.  Given instructions on when to wear TLSO brace.     Malvina Alm DASEN, MD 12/09/23 1316

## 2023-12-09 NOTE — Discharge Instructions (Addendum)
 I have sent pain medication to the pharmacy to take as needed and as prescribed.  You should follow-up with neurosurgery and they should give you a call to see them in the next couple weeks.  You should wear your brace pretty much at all times unless you are completely at rest and awake or while showering.  Please follow-up with your PCP and neurosurgeon for ongoing pain management.

## 2023-12-09 NOTE — ED Provider Notes (Signed)
 [facility] Provider Note    Event Date/Time   First MD Initiated Contact with Patient 12/09/23 0901     (approximate)   History   Back Pain   HPI  Audrey Martinez is a 78 y.o. female    About 3 or 4 weeks of back pain.  Started after lifting a 30 pound bag of dog food.  Since then has been having severe pain with twisting bending standing.  Has seen Dr. Justus and was diagnosed with compression fractures.  Currently taking 1 hydrocodone  tablet every 6 hours.  Pain continues to be daily ongoing and not improving.  Did at first think it was perhaps a pulled muscle or a strain, but symptoms continue.  No numbness or weakness.  No bladder incontinence or difficulty     Physical Exam   Triage Vital Signs: ED Triage Vitals  Encounter Vitals Group     BP 12/09/23 0845 117/82     Girls Systolic BP Percentile --      Girls Diastolic BP Percentile --      Boys Systolic BP Percentile --      Boys Diastolic BP Percentile --      Pulse Rate 12/09/23 0845 96     Resp 12/09/23 0845 20     Temp 12/09/23 0845 98.1 F (36.7 C)     Temp Source 12/09/23 0845 Oral     SpO2 12/09/23 0845 100 %     Weight 12/09/23 0846 200 lb (90.7 kg)     Height 12/09/23 0846 5' 2 (1.575 m)     Head Circumference --      Peak Flow --      Pain Score 12/09/23 0844 10     Pain Loc --      Pain Education --      Exclude from Growth Chart --     Most recent vital signs: Vitals:   12/09/23 0845  BP: 117/82  Pulse: 96  Resp: 20  Temp: 98.1 F (36.7 C)  SpO2: 100%     General: Awake, no distress.  CV:  Good peripheral perfusion.  Strong palpable pulses in both feet bilateral Resp:  Normal effort.  Normal work of breathing Abd:  No distention.  Abdomen soft nontender nondistended.  Patient relates she has had no GI symptoms.  No abdominal pain no diarrhea and no urinary symptoms Other:  Patient lays still, with twisting reports pain in her lower back.  Positive  straight leg raise especially on the right.  She reports normal sensation in both feet legs thighs bilateral.  She demonstrates 5/5 strength in the feet ankles and knees bilaterally but there is limitation due to pain particularly with flexion at the knees bilateral.  Warm and well-perfused lower extremities.  Notable tenderness to palpation of the lower lumbar spine   ED Results / Procedures / Treatments   Labs (all labs ordered are listed, but only abnormal results are displayed) Labs Reviewed - No data to display   EKG     RADIOLOGY  CT Lumbar Spine Wo Contrast Result Date: 12/09/2023 EXAM: CT OF THE LUMBAR SPINE WITHOUT CONTRAST 12/09/2023 09:48:08 AM TECHNIQUE: CT of the lumbar spine was performed without the administration of intravenous contrast. Multiplanar reformatted images are provided for review. Automated exposure control, iterative reconstruction, and/or weight based adjustment of the mA/kV was utilized to reduce the radiation dose to as low as reasonably achievable. COMPARISON: 2 lumbar spine radiographs 02/29/2024 and MRI of  the lumbar spine 10/08/2016. CLINICAL HISTORY: Low back pain began 1 month ago after pulling a heavy bag of dog food. FINDINGS: BONES AND ALIGNMENT: The inferior endplate fracture at L2 and superior endplate fracture at L3 are remote. The L1 superior endplate compression fracture is subacute and incompletely healed. 50% loss of height is present. Retropulsed bone extends 5 mm into the spinal canal. DEGENERATIVE CHANGES: A broad-based disc protrusion at L2-3 results in moderate foraminal stenosis bilaterally. The central canal is decompressed secondary to left laminectomy. Severe left and moderate right foraminal stenosis again noted at L3-4. The central canal is decompressed by left laminectomy. Severe left foraminal stenosis at L4-5 is stable. Mild right foraminal narrowing is present. Moderate central canal stenosis is present. L5-S1: Moderate left and mild  right foraminal stenosis is secondary to facet spurring. SOFT TISSUES: Atherosclerotic calcifications are present in the aorta and branch vessels without aneurysm. A 6.3 cm simple cyst is present posteriorly in the right kidney. Coronary artery calcifications are present. IMPRESSION: 1. Subacute, incompletely healed L1 superior endplate compression fracture with 50% height loss and 5 mm retropulsion into the spinal canal. 2. Remote inferior endplate fracture at L2 and superior endplate fracture at L3. 3. Multilevel degenerative changes as detailed above, including severe left foraminal stenosis at L4-5 (stable) and moderate central canal stenosis at L4-5. Electronically signed by: Lonni Necessary MD 12/09/2023 10:41 AM EDT RP Workstation: [id number]        CT imaging of the lumbar spine inter by me is grossly positive for compression injuries/fracture  PROCEDURES:  Critical Care performed: No  Procedures   MEDICATIONS ORDERED IN ED: Medications  HYDROcodone -acetaminophen  (NORCO/VICODIN) 5-325 MG per tablet 1 tablet (1 tablet Oral Given 12/09/23 0936)     IMPRESSION / MDM / ASSESSMENT AND PLAN / ED COURSE  I reviewed the triage vital signs and the nursing notes.                              Differential diagnosis includes, but is not limited to, acute low back pain, compression fracture, vertebral injury, musculoskeletal pain etc.  No symptoms that would suggest acute vascular cause, intra-abdominal cause, infection etc.  She does not have any dense neurologic deficits.  She does not have any report of urinary incontinence or difficulty voiding.  She demonstrates good range of motion but pain seems to be primary concern.  Took 1 hydrocodone  prior to coming, will give additional hydrocodone  here.  Obtain CT of the lumbar spine to further evaluate  Patient's presentation is most consistent with acute complicated illness / injury requiring diagnostic workup.      Clinical Course as  of 12/09/23 1131  Sat Dec 09, 2023  1050 Paged. Dr. Maryrose (ortho) via his answering service. [MQ]    Clinical Course User Index [MQ] Dicky Anes, MD   ----------------------------------------- 11:31 AM on 12/09/2023 ----------------------------------------- Case discussed with Dr. Deatrice.  Dr. Maryrose at does not cover spine at this time.  Dr. Malachy does advise recommendation for TLSO bracing which we reviewed and I have ordered, conservative treatment and close outpatient follow-up.  Admission to medical service for pain control if required.  Patient does appear to be appropriate for close outpatient follow-up which Dr. Knute will notify Dr. Precious team of on Monday.  Discussed with the patient and her son, agreeable to obtaining TLSO brace today, and if pain able to be controlled well likely disposition to follow-up outpatient and continue  her prescribed pain medication and use of bracing.  If pain intractable, etc could be considered for potential admission.  Ongoing care to Dr. Malvina with plan to follow-up on patient symptomatology and after TLSO brace  FINAL CLINICAL IMPRESSION(S) / ED DIAGNOSES   Final diagnoses:  Compression fracture of C1 vertebra, initial encounter (HCC)     Rx / DC Orders   ED Discharge Orders          Ordered    Ambulatory referral to Neurosurgery       Comments: Please Select To Department: [facility] for Nerve or Spine  Please select To Department: [facility] AT [place] for Cranial or Neurovascular   12/09/23 1130             Note:  This document was prepared using Dragon voice recognition software and may include unintentional dictation errors.   Dicky Anes, MD 12/09/23 [phone]

## 2023-12-11 ENCOUNTER — Telehealth: Payer: Self-pay

## 2023-12-11 NOTE — Telephone Encounter (Signed)
 Audrey Martinez was in the ER on 12/09/23 for an L1 fracture. *ER diagnosis says C1 fracture, but it is L1. The ER called Dr Deatrice while he was covering call for our department.  Per Dr Deatrice: 78 year old female who approximately 3 weeks ago experienced acute onset of low back pain while lifting a bag of dog food she saw her primary care physician underwent an x-ray of her spine which demonstrated potential compression fracture at the level of L1 and subsequently presenting emergency room today for ongoing pain difficulties.  L! Superior endplate compression fracture, approx 50% LOH, min retropulsion, neuro intact.  PLAN: TLSO brace, mobilize as tolerated Will need outpatient followup   Reviewed with Dr Claudene. Please schedule a new patient appointment with any PA in 2-4 weeks. Will need lumbar xrays (AP & lateral) that day. Thanks!

## 2023-12-12 NOTE — Telephone Encounter (Signed)
 Patient scheduled for August 5th at 2:30 with lumbar xrays.

## 2023-12-13 DIAGNOSIS — Z96641 Presence of right artificial hip joint: Secondary | ICD-10-CM | POA: Diagnosis not present

## 2023-12-13 DIAGNOSIS — M1611 Unilateral primary osteoarthritis, right hip: Secondary | ICD-10-CM | POA: Diagnosis not present

## 2023-12-13 DIAGNOSIS — E7800 Pure hypercholesterolemia, unspecified: Secondary | ICD-10-CM | POA: Diagnosis not present

## 2023-12-13 DIAGNOSIS — I471 Supraventricular tachycardia, unspecified: Secondary | ICD-10-CM | POA: Diagnosis not present

## 2023-12-13 DIAGNOSIS — J45909 Unspecified asthma, uncomplicated: Secondary | ICD-10-CM | POA: Diagnosis not present

## 2023-12-13 DIAGNOSIS — G629 Polyneuropathy, unspecified: Secondary | ICD-10-CM | POA: Diagnosis not present

## 2023-12-13 DIAGNOSIS — K219 Gastro-esophageal reflux disease without esophagitis: Secondary | ICD-10-CM | POA: Diagnosis not present

## 2023-12-13 NOTE — Progress Notes (Signed)
 HPI:  Audrey Martinez is a 78 y.o. female who presents for follow-up 80-month status post right anterior total replacement.  Patient has been doing fantastic with regards to her hip.  Unfortunately she injured her back picking up a bag of dog food and sustained multiple compression fractures in her lumbar spine.  She is currently being treated in a TLSO brace.  She is on pain medications for her back but reports no need for pain medications for her hip at this point.  The patient denies fevers, chills, numbness, tingling, shortness of breath, chest pain, recent illness, or any other trauma.  Current Outpatient Medications  Medication Sig Dispense Refill  . HYDROcodone -acetaminophen  (NORCO) 5-325 mg tablet TAKE 1 TABLET BY MOUTH EVERY 6 HOURS AS NEEDED FOR 5 DAYS    . methocarbamoL  (ROBAXIN ) 500 MG tablet Take 500 mg by mouth    . traMADoL  (ULTRAM ) 50 mg tablet Take 50 mg by mouth every 8 (eight) hours as needed    . baclofen  (LIORESAL ) 10 MG tablet Take by mouth    . C/sourcherry/celery/grape seed (TART CHERRY ORAL) Take by mouth    . carvediloL  (COREG ) 6.25 MG tablet Take 6.25 mg by mouth 2 (two) times daily with meals    . cholecalciferol  (VITAMIN D3) 1,000 unit tablet Take 1,000 Units by mouth once daily    . cholecalciferol  (VITAMIN D3) 400 unit tablet Take 1 tablet by mouth once daily    . cranberry 500 mg Cap Take 1,000 mg by mouth once daily    . diltiazem  (CARDIZEM  CD) 120 MG XR capsule Take by mouth    . ENTRESTO  24-26 mg tablet Take 1 tablet by mouth 2 (two) times daily    . ezetimibe  (ZETIA ) 10 mg tablet Take 1 tablet by mouth once daily    . famotidine  (PEPCID ) 20 MG tablet Take 20 mg by mouth    . FLOVENT  HFA 110 mcg/actuation inhaler Take 2 inhalations by mouth 2 (two) times daily  2  . gabapentin  (NEURONTIN ) 300 MG capsule Take by mouth    . ginger, Zingiber officinalis, 500 mg Cap Take by mouth    . loratadine (CLARITIN) 10 mg tablet Take 10 mg by mouth once daily    . meloxicam   (MOBIC ) 15 MG tablet Take 1 tablet by mouth once daily as needed    . multivitamin with minerals tablet Take by mouth    . PROVENTIL  HFA 90 mcg/actuation inhaler     . STRIVERDI RESPIMAT  2.5 mcg/actuation Mist Inhale 2 inhalations into the lungs once daily  3  . tiZANidine (ZANAFLEX) 2 MG tablet Take 1 tablet (2 mg total) by mouth every 8 (eight) hours as needed for Muscle spasms 30 tablet 0  . turmeric root extract 500 mg Cap Take 500 mg by mouth once daily    . UNABLE TO FIND Med Name: Bone up [place]    . vitamin E, dl,tocopheryl acet, (VITAMIN E, DL, ACETATE,) 100 unit capsule Take by mouth    . XARELTO  10 mg tablet Take 10 mg by mouth once daily     No current facility-administered medications for this visit.   Allergies  Allergen Reactions  . Amoxicillin Hives, Shortness Of Breath and Swelling    Did it involve swelling of the face/tongue/throat, SOB, or low BP? Yes Did it involve sudden or severe rash/hives, skin peeling, or any reaction on the inside of your mouth or nose? Yes Did you need to seek medical attention at a hospital or doctor's  office? Yes When did it last happen? 1998      If all above answers are "NO", may proceed with cephalosporin use.  . Aspirin Anaphylaxis  . Ciprofloxacin Rash  . Ciprofloxacin-Dexamethasone  Anaphylaxis  . Morphine  Nausea and Nausea And Vomiting  . Morpholine Salicylate Nausea  . Neomycin-Bacitracin-Poly-Hc Rash  . Neomycin-Bacitracin-Polymyxin Rash  . Opioids - Morphine  Analogues Nausea  . Rosuvastatin  Other (See Comments)    Caused problems with memory.    . Cephalexin  Hives  . Doxycycline Hyclate Hives  . Hydrochlorothiazide Other (See Comments)    Raises her blood pressure.     . Lovastatin Other (See Comments)    Unable to function  . Simvastatin Other (See Comments)    Unable to function  . Erythromycin Hives  . Ibuprofen Other (See Comments)    Sharp stomach pains    . Adhesive Tape-Silicones Hives and Rash  .  Neomycin-Bacitracnzn-Polymyxnb Hives and Rash  . Omeprazole Palpitations    Tachycardia and same sensation as her SVTs per patient     Past Medical History:  Diagnosis Date  . Asthma without status asthmaticus (HHS-HCC)   . Neuropathy    right arm, due to injury & surgery  . Subdural hematoma (CMS/HHS-HCC) 2022  . SVT (supraventricular tachycardia) (CMS/HHS-HCC)   . Tachycardia, unspecified    Past Surgical History:  Procedure Laterality Date  . TONSILLECTOMY & ADENOIDECTOMY  1959  . UMBILICAL HERNIA REPAIR  2001  . DEEP NECK LYMPH NODE BIOPSY / EXCISION  2002  . heart ablation  2009   x2  . REPLACEMENT TOTAL KNEE Right 2012  . lumbar decompression L3-5  12/2015   Audrey Martinez ([place] )  . Derek procedure Bilateral   . Right shoulder surgery Right 2013-2014   x 2    No family history on file.  Social History   Socioeconomic History  . Marital status: Widowed  Tobacco Use  . Smoking status: Never  . Smokeless tobacco: Never  Substance and Sexual Activity  . Alcohol use: Never   Social Drivers of Corporate investment banker Strain: Low Risk  (08/02/2023)   Overall Financial Resource Strain (CARDIA)   . Difficulty of Paying Living Expenses: Not hard at all  Food Insecurity: No Food Insecurity (11/16/2023)   Received from [facility]   Hunger Vital Sign   . Within the past 12 months, you worried that your food would run out before you got the money to buy more.: Never true   . Within the past 12 months, the food you bought just didn't last and you didn't have money to get more.: Never true  Transportation Needs: No Transportation Needs (11/16/2023)   Received from [facility] - Transportation   . In the past 12 months, has lack of transportation kept you from medical appointments or from getting medications?: No   . In the past 12 months, has lack of transportation kept you from meetings, work, or from getting things needed for daily living?: No     Review of Systems:  A comprehensive 14 point ROS was performed, reviewed, and the pertinent orthopaedic findings are documented in the HPI.  Exam: Vitals:   12/13/23 1501  BP: 130/78  Height: 162.6 cm (5' 4)  PainSc: 0-No pain  PainLoc: Hip   General/Constitutional: The patient appears to be well-nourished, well-developed, and in no acute distress. Neuro/Psych: Normal mood and affect, oriented to person, place and time.  Right lower Extremity Exam  The right hip  is noted to have a well-healing surgical incision on the anterior thigh with no evidence of erythema or drainage.  No evidence for infection at this time.   No swelling tenderness or fluctuance noted around the hip Range of motion of the hip is smooth and pain-free with equal leg lengths.   Neurovascularly intact distally, able to dorsiflex with good sensation over the foot.   Negative Homans' sign no calf tenderness to palpation.     X-rays/MRI/Lab data:  3 view x-rays AP, lateral of the right hip and pelvis ordered and taken today in clinic and images reviewed by myself show status post right total hip arthroplasty with components in appropriate position.   No evidence of periprosthetic loosening or fracture.  Assessment: Encounter Diagnosis  Name Primary?  . Status post right hip replacement Yes  Compression fractures lumbar  Plan: The patient is doing well status post their right total hip replacement.  The patient has progressed well with physical therapy and will continue with a home exercise program to continue strengthening the hip.  Given the patient sustained compression fractures from a minimal impact injury we did discuss the importance of getting her worked up for decreased bone density and I will order a DEXA scan and recommend follow-up with her primary care about potential treatment for any finding of osteoporosis.  With regards to the right hip she is healing well we will continue with exercises and  over-the-counter medications as needed for the hip itself. We reviewed the use of antibiotics with any future dental procedures for at least the next 2 years and the patient will contact the office if they have any upcoming dental appointments for a prescription.  Overall the patient is doing well with their new hip replacement.  They will follow-up in 2 months for a repeat examination and x-rays at that time.  All questions answered and the patient agrees with the above plan.

## 2023-12-14 ENCOUNTER — Other Ambulatory Visit: Payer: Self-pay

## 2023-12-14 NOTE — Patient Instructions (Signed)
 Visit Information  Thank you for taking time to visit with me today. Please don't hesitate to contact me if I can be of assistance to you before our next scheduled appointment.  Your next care management appointment is by telephone on Thursday, July 31st at 3:45pm   Please call the care guide team at [phone] if you need to cancel, schedule, or reschedule an appointment.   A reminder to ALL patients/family/friends,  please call the [place]  National Suicide Prevention Lifeline: (818)694-1879 or TTY: (908)661-8819 TTY [phone]) to talk to a trained counselor if you are experiencing a Mental Health or Behavioral Health Crisis or need someone to talk to.  Santana Stamp BSN, CCM [place]  VBCI Population Health RN Care Manager Direct Dial: [phone]  Fax: [phone]

## 2023-12-14 NOTE — Patient Outreach (Signed)
 Complex Care Management   Visit Note  12/14/2023  Name:  Audrey Martinez MRN: [id number] DOB: 11/07/1945  Situation: Referral received for Complex Care Management related to Falls, Dementia. I obtained verbal consent from Caregiver.  Visit completed with Mr. Audrey Martinez, son,   on the phone.  Main concern today is continued back pain from confirmed lumbar fractures in L1-3 spine.  She was prescribed Vicodin 5/325 every 6 hours PRN, Lidocaine  patch every 24 hours, and methocarbamol  500mg  every 8 hours PRN but still has difficulty with movement and ambulation.  She is getting HHPT twice a week.  States patient's memory is poor.  A referral was placed to [facility] GUIDE program, initial evaluation performed 11/27/23 but he hasn't heard back as of yet, he was told they have to get insurance approval. He wants to keep patient at home as long as possible,not interested in alternative housing resources at this time.   Background:   Past Medical History:  Diagnosis Date   Actinic keratosis    Acute renal failure (ARF) (HCC) 01/31/2020   Acute respiratory failure with hypoxia (HCC) 01/29/2020   Amaurosis fugax, right eye 12/2018   Aortic atherosclerosis (HCC)    Arthritis    Asthma    Atrial tachycardia (HCC)    a.) s/p RFCA for AVNRT in 2008 at [facility] [place]    Basal cell carcinoma 07/2016   central forehead (tx in [place] )   Bilateral pulmonary embolism (HCC) 01/28/2020   a.) in setting of SARS-CoV-2 infection (12/2019); noted BILATERALLY with (+) associated RIGHT heart strain; Tx'd with rivaroxaban  Xarelto  20 mg daily x 6 months then PRN   Cerebellar infarct (RIGHT) 1998   Coronary artery disease    DDD (degenerative disc disease), cervical    Dyspnea    Facial laceration 06/16/2022   Date of injury: 06/08/2022  [facility] ED history below:  78 year old female presenting to the emergency department after mechanical, nonsyncopal fall causing  her to strike her face on the edge/corner of a curio cabinet and sustaining a deep and approximate 8 cm laceration to the right cheek in an irregular pattern to the angle of the right lower jaw. Wound does not proceed th   GERD (gastroesophageal reflux disease)    Herniated disc, lumbar    HFrEF (heart failure with reduced ejection fraction) (HCC)    History of 2019 novel coronavirus disease (COVID-19) 12/2019   History of bilateral cataract extraction    Hyperlipidemia    Incomplete left bundle branch block (LBBB)    Left wrist fracture    Lumbar stenosis    s/p LEFT L2-4 hemilaminectomies and partial facetectomies, foraminotomies for lateral recess and foraminal decompression of L3 and L4 nerve roots   Melanoma (HCC) 05/12/2020   Melanoma IS R post neck, excised 08/10/20   Mobitz type 1 second degree atrioventricular block    a.) evaluated by EP --> paroxysmal in nature; nocturnal epidoe felt to be vagally mediated   NICM (nonischemic cardiomyopathy) (HCC)    a. 04/2018 Echo: EF 45-50%. Anteroseptal and apical HK in some views. Mild AI/MR. Nl RV fxn; b. 05/2018 MV: mid-dist ant and antsept mild defect - ? breast atten. EF 41%. No ischemia; c. 03/2019 Echo: EF 30-35%, Gr2 DD, Trace MR, triv TR. Asc Ao ectatic dil - 38mm.    Osteoporosis    Personal history of transient ischemic attack (TIA), and cerebral infarction without residual deficits 04/22/2003   Pneumonia due to COVID-19 virus 01/26/2020  SVT (supraventricular tachycardia) (HCC)    a.2008 s/p RFCA for AVNRT; b. 9 & 02/2019 Zio x 2: 1. Avg HR 89, 3 runs NSVT (longest 7 beats), Mobitz 1. 2. Avg HR 89, NSVT x 1 (5 beats), Mobitz 1. No signif arrhythmia.   Tendinitis of right knee 07/03/2017    Assessment: Patient Reported Symptoms:  Cognitive Cognitive Status: Poor judgment in daily scenarios, Requires Assistance Decision Making, Able to follow simple commands, Confused or disoriented (Assessment was performed with patient's  son/DPR, Audrey Martinez.)      Neurological Neurological Review of Symptoms: Not assessed    HEENT HEENT Symptoms Reported: Not assessed      Cardiovascular Cardiovascular Symptoms Reported: Not assessed    Respiratory Respiratory Symptoms Reported: Not assesed    Endocrine Endocrine Symptoms Reported: Not assessed    Gastrointestinal Gastrointestinal Symptoms Reported: Not assessed      Genitourinary Genitourinary Symptoms Reported: Not assessed    Integumentary Integumentary Symptoms Reported: Not assessed    Musculoskeletal Musculoskelatal Symptoms Reviewed: Difficulty walking Additional Musculoskeletal Details: Had ED visit 12/09/23, CT scan of lumbar spine showed compression fracture to L1, L2, and L3.  She was ordered a TLSO brace and is wearing, receiving HHPT twice a week, has follow up for repeat scan 01/02/24. Musculoskeletal Management Strategies: Medication therapy      Psychosocial Psychosocial Symptoms Reported: Not assessed            11/29/2023    3:58 PM  Depression screen PHQ 2/9  Decreased Interest 0  Down, Depressed, Hopeless 0  PHQ - 2 Score 0  Altered sleeping 1  Tired, decreased energy 1  Change in appetite 0  Feeling bad or failure about yourself  0  Trouble concentrating 0  Moving slowly or fidgety/restless 0  Suicidal thoughts 0  PHQ-9 Score 2  Difficult doing work/chores Not difficult at all    There were no vitals filed for this visit.  Medications Reviewed Today   Medications were not reviewed in this encounter     Recommendation:   Specialty provider follow-up : Neurosurgery 01/02/24  -Son is awaiting call back for [facility] program evaluation status  -Patient is wearing TLSO brace as ordered by ED on visit 12/09/23, and is receiving HHPT twice a week.   Follow Up Plan:   Telephone follow-up two weeks.   Audrey Martinez BSN, CCM [place]  [facility] Population Health RN Care Manager Direct Dial: [phone]  Fax:  [phone]

## 2023-12-15 DIAGNOSIS — E7800 Pure hypercholesterolemia, unspecified: Secondary | ICD-10-CM | POA: Diagnosis not present

## 2023-12-15 DIAGNOSIS — K219 Gastro-esophageal reflux disease without esophagitis: Secondary | ICD-10-CM | POA: Diagnosis not present

## 2023-12-15 DIAGNOSIS — G629 Polyneuropathy, unspecified: Secondary | ICD-10-CM | POA: Diagnosis not present

## 2023-12-15 DIAGNOSIS — I471 Supraventricular tachycardia, unspecified: Secondary | ICD-10-CM | POA: Diagnosis not present

## 2023-12-15 DIAGNOSIS — M1611 Unilateral primary osteoarthritis, right hip: Secondary | ICD-10-CM | POA: Diagnosis not present

## 2023-12-15 DIAGNOSIS — J45909 Unspecified asthma, uncomplicated: Secondary | ICD-10-CM | POA: Diagnosis not present

## 2023-12-20 DIAGNOSIS — M1611 Unilateral primary osteoarthritis, right hip: Secondary | ICD-10-CM | POA: Diagnosis not present

## 2023-12-20 DIAGNOSIS — J45909 Unspecified asthma, uncomplicated: Secondary | ICD-10-CM | POA: Diagnosis not present

## 2023-12-20 DIAGNOSIS — G629 Polyneuropathy, unspecified: Secondary | ICD-10-CM | POA: Diagnosis not present

## 2023-12-20 DIAGNOSIS — K219 Gastro-esophageal reflux disease without esophagitis: Secondary | ICD-10-CM | POA: Diagnosis not present

## 2023-12-20 DIAGNOSIS — I471 Supraventricular tachycardia, unspecified: Secondary | ICD-10-CM | POA: Diagnosis not present

## 2023-12-20 DIAGNOSIS — E7800 Pure hypercholesterolemia, unspecified: Secondary | ICD-10-CM | POA: Diagnosis not present

## 2023-12-22 DIAGNOSIS — E7800 Pure hypercholesterolemia, unspecified: Secondary | ICD-10-CM | POA: Diagnosis not present

## 2023-12-22 DIAGNOSIS — G629 Polyneuropathy, unspecified: Secondary | ICD-10-CM | POA: Diagnosis not present

## 2023-12-22 DIAGNOSIS — M1611 Unilateral primary osteoarthritis, right hip: Secondary | ICD-10-CM | POA: Diagnosis not present

## 2023-12-22 DIAGNOSIS — I471 Supraventricular tachycardia, unspecified: Secondary | ICD-10-CM | POA: Diagnosis not present

## 2023-12-22 DIAGNOSIS — K219 Gastro-esophageal reflux disease without esophagitis: Secondary | ICD-10-CM | POA: Diagnosis not present

## 2023-12-22 DIAGNOSIS — J45909 Unspecified asthma, uncomplicated: Secondary | ICD-10-CM | POA: Diagnosis not present

## 2023-12-25 DIAGNOSIS — G629 Polyneuropathy, unspecified: Secondary | ICD-10-CM | POA: Diagnosis not present

## 2023-12-25 DIAGNOSIS — J45909 Unspecified asthma, uncomplicated: Secondary | ICD-10-CM | POA: Diagnosis not present

## 2023-12-25 DIAGNOSIS — I471 Supraventricular tachycardia, unspecified: Secondary | ICD-10-CM | POA: Diagnosis not present

## 2023-12-25 DIAGNOSIS — M1611 Unilateral primary osteoarthritis, right hip: Secondary | ICD-10-CM | POA: Diagnosis not present

## 2023-12-25 DIAGNOSIS — K219 Gastro-esophageal reflux disease without esophagitis: Secondary | ICD-10-CM | POA: Diagnosis not present

## 2023-12-25 DIAGNOSIS — E7800 Pure hypercholesterolemia, unspecified: Secondary | ICD-10-CM | POA: Diagnosis not present

## 2023-12-28 ENCOUNTER — Telehealth: Payer: Self-pay

## 2023-12-28 NOTE — Progress Notes (Deleted)
 Referring Physician:  [name] DEL, MD [address] [place],  [place] [address]  Primary Physician:  [name] DEL, MD  History of Present Illness: 12/28/2023 [name] is here today with a chief complaint of ***  inferior endplate fracture at L2 and superior endplate fracture at L3.   Wearing TLSO?     Past Surgery: ***01/12/2016 Left lumbar 2-4 lumbar Laminectomy with decompression  Audrey Martinez has ***no symptoms of cervical myelopathy.  The symptoms are causing a significant impact on the patient's life.   Review of Systems:  A 10 point review of systems is negative, except for the pertinent positives and negatives detailed in the HPI.  Past Medical History: Past Medical History:  Diagnosis Date   Actinic keratosis    Acute renal failure (ARF) (HCC) 01/31/2020   Acute respiratory failure with hypoxia (HCC) 01/29/2020   Amaurosis fugax, right eye 12/2018   Aortic atherosclerosis (HCC)    Arthritis    Asthma    Atrial tachycardia (HCC)    a.) s/p RFCA for AVNRT in 2008 at [facility] [place]    Basal cell carcinoma 07/2016   central forehead (tx in [place] )   Bilateral pulmonary embolism (HCC) 01/28/2020   a.) in setting of SARS-CoV-2 infection (12/2019); noted BILATERALLY with (+) associated RIGHT heart strain; Tx'd with rivaroxaban  Xarelto  20 mg daily x 6 months then PRN   Cerebellar infarct (RIGHT) 1998   Coronary artery disease    DDD (degenerative disc disease), cervical    Dyspnea    Facial laceration 06/16/2022   Date of injury: 06/08/2022  [facility] ED history below:  78 year old female presenting to the emergency department after mechanical, nonsyncopal fall causing her to strike her face on the edge/corner of a curio cabinet and sustaining a deep and approximate 8 cm laceration to the right cheek in an irregular pattern to the angle of the right lower jaw. Wound does not proceed th    GERD (gastroesophageal reflux disease)    Herniated disc, lumbar    HFrEF (heart failure with reduced ejection fraction) (HCC)    History of 2019 novel coronavirus disease (COVID-19) 12/2019   History of bilateral cataract extraction    Hyperlipidemia    Incomplete left bundle branch block (LBBB)    Left wrist fracture    Lumbar stenosis    s/p LEFT L2-4 hemilaminectomies and partial facetectomies, foraminotomies for lateral recess and foraminal decompression of L3 and L4 nerve roots   Melanoma (HCC) 05/12/2020   Melanoma IS R post neck, excised 08/10/20   Mobitz type 1 second degree atrioventricular block    a.) evaluated by EP --> paroxysmal in nature; nocturnal epidoe felt to be vagally mediated   NICM (nonischemic cardiomyopathy) (HCC)    a. 04/2018 Echo: EF 45-50%. Anteroseptal and apical HK in some views. Mild AI/MR. Nl RV fxn; b. 05/2018 MV: mid-dist ant and antsept mild defect - ? breast atten. EF 41%. No ischemia; c. 03/2019 Echo: EF 30-35%, Gr2 DD, Trace MR, triv TR. Asc Ao ectatic dil - 38mm.    Osteoporosis    Personal history of transient ischemic attack (TIA), and cerebral infarction without residual deficits 04/22/2003   Pneumonia due to COVID-19 virus 01/26/2020   SVT (supraventricular tachycardia) (HCC)    a.2008 s/p RFCA for AVNRT; b. 9 & 02/2019 Zio x 2: 1. Avg HR 89, 3 runs NSVT (longest 7 beats), Mobitz 1. 2. Avg HR 89, NSVT x 1 (5  beats), Mobitz 1. No signif arrhythmia.   Tendinitis of right knee 07/03/2017    Past Surgical History: Past Surgical History:  Procedure Laterality Date   APPENDECTOMY     BREAST CYST ASPIRATION Left 1977   CARDIAC CATHETERIZATION  2009   [facility] [place] [facility]   CARDIAC ELECTROPHYSIOLOGY STUDY AND ABLATION  2009   CATARACT EXTRACTION, BILATERAL  2010   COLONOSCOPY WITH PROPOFOL  N/A 03/21/2018   Procedure: COLONOSCOPY WITH Biopsy;  Surgeon: Unk Corinn Skiff, MD;  Location: [facility];  Service:  Endoscopy;  Laterality: N/A;   COLONOSCOPY WITH PROPOFOL  N/A 05/03/2021   Procedure: COLONOSCOPY WITH PROPOFOL ;  Surgeon: Unk Corinn Skiff, MD;  Location: [facility] ENDOSCOPY;  Service: Gastroenterology;  Laterality: N/A;   CORONARY ANGIOPLASTY     DARRACH'S PROCEDURE Bilateral 1977   ESOPHAGOGASTRODUODENOSCOPY  2013   gastritis; done for complaint of dysphagia   ESOPHAGOGASTRODUODENOSCOPY (EGD) WITH PROPOFOL  N/A 03/21/2018   Procedure: ESOPHAGOGASTRODUODENOSCOPY (EGD) WITH Biopsies;  Surgeon: Unk Corinn Skiff, MD;  Location: [facility];  Service: Endoscopy;  Laterality: N/A;  KEEP THIS PATIENT FIRST   JOINT REPLACEMENT     LEFT HEART CATH AND CORONARY ANGIOGRAPHY Left 05/02/2019   Procedure: LEFT HEART CATH AND CORONARY ANGIOGRAPHY;  Surgeon: Perla Evalene PARAS, MD;  Location: [facility] INVASIVE CV LAB;  Service: Cardiovascular;  Laterality: Left;   LUMBAR LAMINECTOMY  2017   Procedure: LUMBAR LAMINECTOMY (L2-L4)   LYMPH NODE REMOVAL (NECK)     MOHS SURGERY  07/2016   BCCA nose   POLYPECTOMY N/A 03/21/2018   Procedure: POLYPECTOMY;  Surgeon: Unk Corinn Skiff, MD;  Location: [facility];  Service: Endoscopy;  Laterality: N/A;   REPLACEMENT TOTAL KNEE Right 2013   TONSILLECTOMY     TOTAL HIP ARTHROPLASTY Right 10/09/2023   Procedure: ARTHROPLASTY, HIP, TOTAL, ANTERIOR APPROACH;  Surgeon: Lorelle Hussar, MD;  Location: [facility] ORS;  Service: Orthopedics;  Laterality: Right;   TOTAL SHOULDER REPLACEMENT Right 2012    Allergies: Allergies as of 01/02/2024 - Review Complete 12/09/2023  Allergen Reaction Noted   Amoxicillin Hives, Shortness Of Breath, and Swelling 03/21/2017   Aspirin Anaphylaxis and Shortness Of Breath 03/21/2017   Bacitracin-polymyx-neo-hc [bacitra-neomycin-polymyxin-hc] Rash 01/28/2022   Ciprofloxacin Rash 08/28/2017   Ciprofloxacin-dexamethasone  Anaphylaxis 03/21/2017   Crestor  [rosuvastatin ] Other (See Comments) 03/21/2017   Erythromycin Hives and Rash  03/21/2017   Hydrochlorothiazide Other (See Comments) and Palpitations 03/21/2017   Ibuprofen Other (See Comments) 03/21/2017   Morphine  and codeine Nausea And Vomiting 07/04/2017   Morpholine salicylate Nausea Only 01/28/2022   Omeprazole Palpitations and Other (See Comments) 03/21/2017   Doxycycline hyclate Hives 03/21/2017   Keflex  [cephalexin ] Hives 03/21/2017   Lovastatin Other (See Comments) 03/21/2017   Simvastatin Other (See Comments) 03/21/2017   Bacitracin  10/13/2023   Dexamethasone   10/13/2023   Silicone Dermatitis and Hives 03/21/2017   Neomycin-bacitracin zn-polymyx Hives and Rash 03/21/2017   Tape Hives and Rash 03/21/2017    Medications: Outpatient Encounter Medications as of 01/02/2024  Medication Sig   acetaminophen  (TYLENOL ) 325 MG tablet Take 1-2 tablets (325-650 mg total) by mouth every 6 (six) hours as needed for mild pain (pain score 1-3) (or temp > 100.5).   B Complex-C (B-COMPLEX WITH VITAMIN C) tablet Take 1 tablet by mouth daily.    calcium  carbonate (OS-CAL - DOSED IN MG OF ELEMENTAL CALCIUM ) 1250 (500 Ca) MG tablet Take 1 tablet by mouth daily with breakfast.   carvedilol  (COREG ) 6.25 MG tablet Take 1 tablet (6.25 mg  total) by mouth 2 (two) times daily with a meal. Please call [phone] to schedule an appointment with Dr. Timothy Gollan for future refills. Thank you. 1st attempt.   cholecalciferol  (VITAMIN D ) 25 MCG (1000 UT) tablet Take 1,000 Units by mouth at bedtime.   Cranberry 1000 MG CAPS Take 1,000 mg by mouth daily.    ezetimibe  (ZETIA ) 10 MG tablet Take 1 tablet (10 mg total) by mouth daily.   fluticasone  (FLOVENT  HFA) 110 MCG/ACT inhaler Inhale 1 puff into the lungs every 12 (twelve) hours as needed.   gabapentin  (NEURONTIN ) 300 MG capsule Take 1 capsule (300 mg total) by mouth 3 (three) times daily. Give 3 capsules by mouth at bedtime.   HYDROcodone -acetaminophen  (NORCO/VICODIN) 5-325 MG tablet Take 1 tablet by mouth every 6 (six) hours as needed  for moderate pain (pain score 4-6).   loratadine (CLARITIN) 10 MG tablet Take 10 mg by mouth daily as needed for allergies. (Patient not taking: Reported on 11/29/2023)   methocarbamol  (ROBAXIN ) 500 MG tablet Take 1 tablet (500 mg total) by mouth every 8 (eight) hours as needed.   Multiple Vitamins-Minerals (MULTIVITAMIN WITH MINERALS) tablet Take 1 tablet by mouth daily. Centrum Silver   ondansetron  (ZOFRAN ) 4 MG tablet Take 1 tablet (4 mg total) by mouth every 6 (six) hours as needed for nausea.   polyethylene glycol (MIRALAX  / GLYCOLAX ) 17 g packet Take 17 g by mouth daily as needed.   sacubitril -valsartan  (ENTRESTO ) 24-26 MG TAKE 1 TABLET BY MOUTH TWICE A DAY   spironolactone  (ALDACTONE ) 25 MG tablet Take 1 tablet (25 mg total) by mouth daily.   traMADol  (ULTRAM ) 50 MG tablet Take 1 tablet (50 mg total) by mouth every 6 (six) hours as needed for moderate pain (pain score 4-6).   Turmeric 500 MG CAPS Take 500 mg by mouth daily.    No facility-administered encounter medications on file as of 01/02/2024.    Social History: Social History   Tobacco Use   Smoking status: Never   Smokeless tobacco: Never  Vaping Use   Vaping status: Never Used  Substance Use Topics   Alcohol use: Not Currently   Drug use: No    Family Medical History: Family History  Problem Relation Age of Onset   Diabetes Mother    Stroke Mother    Valvular heart disease Father    CAD Sister 80       died of MI   Stroke Sister    Stroke Sister 75   Clotting disorder Half-Sister    Clotting disorder Half-Sister    Breast cancer Neg Hx     Physical Examination: @VITALWITHPAIN @  General: Patient is well developed, well nourished, calm, collected, and in no apparent distress. Attention to examination is appropriate.  Psychiatric: Patient is non-anxious.  Head:  Pupils equal, round, and reactive to light.  ENT:  Oral mucosa appears well hydrated.  Neck:   Supple.  ***Full range of  motion.  Respiratory: Patient is breathing without any difficulty.  Extremities: No edema.  Vascular: Palpable dorsal pedal pulses.  Skin:   On exposed skin, there are no abnormal skin lesions.  NEUROLOGICAL:     Awake, alert, oriented to person, place, and time.  Speech is clear and fluent. Fund of knowledge is appropriate.   Cranial Nerves: Pupils equal round and reactive to light.  Facial tone is symmetric.  Facial sensation is symmetric.  ROM of spine: ***full.  Palpation of spine: ***non tender.    Strength: Side Biceps  Triceps Deltoid Interossei Grip Wrist Ext. Wrist Flex.  R 5 5 5 5 5 5 5   L 5 5 5 5 5 5 5    Side Iliopsoas Quads Hamstring PF DF EHL  R 5 5 5 5 5 5   L 5 5 5 5 5 5    Reflexes are ***2+ and symmetric at the biceps, triceps, brachioradialis, patella and achilles.   Hoffman's is absent.  Clonus is not present.  Toes are down-going.  Bilateral upper and lower extremity sensation is intact to light touch.    Gait is normal.   No difficulty with tandem gait.   No evidence of dysmetria noted.  Medical Decision Making  Imaging: ***  I have personally reviewed the images and agree with the above interpretation.  Assessment and Plan: Ms. Wheless is a pleasant 78 y.o. female with ***    Thank you for involving me in the care of this patient.   I spent a total of *** minutes in both face-to-face and non-face-to-face activities for this visit on the date of this encounter.   Lyle Decamp, PA-C Dept. of Neurosurgery

## 2023-12-29 DIAGNOSIS — Z9089 Acquired absence of other organs: Secondary | ICD-10-CM | POA: Diagnosis not present

## 2023-12-29 DIAGNOSIS — M1611 Unilateral primary osteoarthritis, right hip: Secondary | ICD-10-CM | POA: Diagnosis not present

## 2023-12-29 DIAGNOSIS — F02A0 Dementia in other diseases classified elsewhere, mild, without behavioral disturbance, psychotic disturbance, mood disturbance, and anxiety: Secondary | ICD-10-CM | POA: Diagnosis not present

## 2023-12-29 DIAGNOSIS — E7800 Pure hypercholesterolemia, unspecified: Secondary | ICD-10-CM | POA: Diagnosis not present

## 2023-12-29 DIAGNOSIS — Z96651 Presence of right artificial knee joint: Secondary | ICD-10-CM | POA: Diagnosis not present

## 2023-12-29 DIAGNOSIS — I471 Supraventricular tachycardia, unspecified: Secondary | ICD-10-CM | POA: Diagnosis not present

## 2023-12-29 DIAGNOSIS — J45909 Unspecified asthma, uncomplicated: Secondary | ICD-10-CM | POA: Diagnosis not present

## 2023-12-29 DIAGNOSIS — G629 Polyneuropathy, unspecified: Secondary | ICD-10-CM | POA: Diagnosis not present

## 2023-12-29 DIAGNOSIS — K219 Gastro-esophageal reflux disease without esophagitis: Secondary | ICD-10-CM | POA: Diagnosis not present

## 2024-01-02 ENCOUNTER — Ambulatory Visit (INDEPENDENT_AMBULATORY_CARE_PROVIDER_SITE_OTHER): Admitting: Physician Assistant

## 2024-01-02 ENCOUNTER — Other Ambulatory Visit: Payer: Self-pay | Admitting: Physician Assistant

## 2024-01-02 ENCOUNTER — Other Ambulatory Visit: Payer: Self-pay

## 2024-01-02 ENCOUNTER — Ambulatory Visit
Admission: RE | Admit: 2024-01-02 | Discharge: 2024-01-02 | Disposition: A | Discharge status: Home / Self Care | Attending: Physician Assistant | Admitting: Physician Assistant

## 2024-01-02 ENCOUNTER — Ambulatory Visit: Admitting: Physician Assistant

## 2024-01-02 ENCOUNTER — Ambulatory Visit
Admission: RE | Admit: 2024-01-02 | Discharge: 2024-01-02 | Disposition: A | Discharge status: Home / Self Care | Source: Ambulatory Visit | Attending: Physician Assistant | Admitting: Physician Assistant

## 2024-01-02 ENCOUNTER — Encounter: Payer: Self-pay | Admitting: Physician Assistant

## 2024-01-02 VITALS — BP 114/74 | Ht 62.0 in | Wt 200.0 lb

## 2024-01-02 DIAGNOSIS — M4856XA Collapsed vertebra, not elsewhere classified, lumbar region, initial encounter for fracture: Secondary | ICD-10-CM | POA: Diagnosis not present

## 2024-01-02 DIAGNOSIS — S32010A Wedge compression fracture of first lumbar vertebra, initial encounter for closed fracture: Secondary | ICD-10-CM

## 2024-01-02 DIAGNOSIS — Z8781 Personal history of (healed) traumatic fracture: Secondary | ICD-10-CM | POA: Insufficient documentation

## 2024-01-02 DIAGNOSIS — X500XXA Overexertion from strenuous movement or load, initial encounter: Secondary | ICD-10-CM

## 2024-01-02 DIAGNOSIS — R29890 Loss of height: Secondary | ICD-10-CM | POA: Diagnosis not present

## 2024-01-02 DIAGNOSIS — S32018A Other fracture of first lumbar vertebra, initial encounter for closed fracture: Secondary | ICD-10-CM | POA: Diagnosis not present

## 2024-01-02 DIAGNOSIS — M47812 Spondylosis without myelopathy or radiculopathy, cervical region: Secondary | ICD-10-CM | POA: Diagnosis not present

## 2024-01-02 DIAGNOSIS — M810 Age-related osteoporosis without current pathological fracture: Secondary | ICD-10-CM | POA: Diagnosis not present

## 2024-01-02 DIAGNOSIS — S32000S Wedge compression fracture of unspecified lumbar vertebra, sequela: Secondary | ICD-10-CM

## 2024-01-02 DIAGNOSIS — M4312 Spondylolisthesis, cervical region: Secondary | ICD-10-CM | POA: Diagnosis not present

## 2024-01-02 DIAGNOSIS — S199XXA Unspecified injury of neck, initial encounter: Secondary | ICD-10-CM | POA: Diagnosis not present

## 2024-01-02 DIAGNOSIS — M47816 Spondylosis without myelopathy or radiculopathy, lumbar region: Secondary | ICD-10-CM | POA: Diagnosis not present

## 2024-01-02 DIAGNOSIS — Z043 Encounter for examination and observation following other accident: Secondary | ICD-10-CM | POA: Diagnosis not present

## 2024-01-02 LAB — HM DEXA SCAN

## 2024-01-02 NOTE — Progress Notes (Unsigned)
 Referring Physician:  [name] DEL, MD [address] [place],  [place] [address]  Primary Physician:  [name] DEL, MD  History of Present Illness: 01/02/2024 [name] is here today with a chief complaint of L1 compression fracture believed to be suffered approximately 6 weeks ago when she was lifting a bag of dog food.  She states that she still has some back pain, denies any leg pain.  She has not been wearing her TLSO brace.  The pain is intermittent primarily and she has been using tramadol  and hydrocodone .  She is undergoing physical therapy once a week.  Denies any new weakness, numbness or tingling.    Past Surgery:  01/12/2016 Left lumbar 2-4 lumbar Laminectomy with decompression  Audrey Martinez has no symptoms of cervical myelopathy.  The symptoms are causing a significant impact on the patient's life.   Review of Systems:  A 10 point review of systems is negative, except for the pertinent positives and negatives detailed in the HPI.  Past Medical History: Past Medical History:  Diagnosis Date   Actinic keratosis    Acute renal failure (ARF) (HCC) 01/31/2020   Acute respiratory failure with hypoxia (HCC) 01/29/2020   Amaurosis fugax, right eye 12/2018   Aortic atherosclerosis (HCC)    Arthritis    Asthma    Atrial tachycardia (HCC)    a.) s/p RFCA for AVNRT in 2008 at [facility] [place]    Basal cell carcinoma 07/2016   central forehead (tx in [place] )   Bilateral pulmonary embolism (HCC) 01/28/2020   a.) in setting of SARS-CoV-2 infection (12/2019); noted BILATERALLY with (+) associated RIGHT heart strain; Tx'd with rivaroxaban  Xarelto  20 mg daily x 6 months then PRN   Cerebellar infarct (RIGHT) 1998   Coronary artery disease    DDD (degenerative disc disease), cervical    Dyspnea    Facial laceration 06/16/2022   Date of injury: 06/08/2022  [facility] ED history below:  78 year old female  presenting to the emergency department after mechanical, nonsyncopal fall causing her to strike her face on the edge/corner of a curio cabinet and sustaining a deep and approximate 8 cm laceration to the right cheek in an irregular pattern to the angle of the right lower jaw. Wound does not proceed th   GERD (gastroesophageal reflux disease)    Herniated disc, lumbar    HFrEF (heart failure with reduced ejection fraction) (HCC)    History of 2019 novel coronavirus disease (COVID-19) 12/2019   History of bilateral cataract extraction    Hyperlipidemia    Incomplete left bundle branch block (LBBB)    Left wrist fracture    Lumbar stenosis    s/p LEFT L2-4 hemilaminectomies and partial facetectomies, foraminotomies for lateral recess and foraminal decompression of L3 and L4 nerve roots   Melanoma (HCC) 05/12/2020   Melanoma IS R post neck, excised 08/10/20   Mobitz type 1 second degree atrioventricular block    a.) evaluated by EP --> paroxysmal in nature; nocturnal epidoe felt to be vagally mediated   NICM (nonischemic cardiomyopathy) (HCC)    a. 04/2018 Echo: EF 45-50%. Anteroseptal and apical HK in some views. Mild AI/MR. Nl RV fxn; b. 05/2018 MV: mid-dist ant and antsept mild defect - ? breast atten. EF 41%. No ischemia; c. 03/2019 Echo: EF 30-35%, Gr2 DD, Trace MR, triv TR. Asc Ao ectatic dil - 38mm.    Osteoporosis    Personal history of transient ischemic attack (  TIA), and cerebral infarction without residual deficits 04/22/2003   Pneumonia due to COVID-19 virus 01/26/2020   SVT (supraventricular tachycardia) (HCC)    a.2008 s/p RFCA for AVNRT; b. 9 & 02/2019 Zio x 2: 1. Avg HR 89, 3 runs NSVT (longest 7 beats), Mobitz 1. 2. Avg HR 89, NSVT x 1 (5 beats), Mobitz 1. No signif arrhythmia.   Tendinitis of right knee 07/03/2017    Past Surgical History: Past Surgical History:  Procedure Laterality Date   APPENDECTOMY     BREAST CYST ASPIRATION Left 1977   CARDIAC CATHETERIZATION  2009    [facility] [place] [facility]   CARDIAC ELECTROPHYSIOLOGY STUDY AND ABLATION  2009   CATARACT EXTRACTION, BILATERAL  2010   COLONOSCOPY WITH PROPOFOL  N/A 03/21/2018   Procedure: COLONOSCOPY WITH Biopsy;  Surgeon: Unk Audrey Skiff, MD;  Location: [facility];  Service: Endoscopy;  Laterality: N/A;   COLONOSCOPY WITH PROPOFOL  N/A 05/03/2021   Procedure: COLONOSCOPY WITH PROPOFOL ;  Surgeon: Unk Audrey Skiff, MD;  Location: [facility] END[facility];  Service: Gastroenterology;  Laterality: N/A;   CORONARY ANGIOPLASTY     DARRACH'S PROCEDURE Bilateral 1977   ESOPHAGOGASTRODUODENOSCOPY  2013   gastritis; done for complaint of dysphagia   ESOPHAGOGASTRODUODENOSCOPY (EGD) WITH PROPOFOL  N/A 03/21/2018   Procedure: ESOPHAGOGASTRODUODENOSCOPY (EGD) WITH Biopsies;  Surgeon: Unk Audrey Skiff, MD;  Location: [facility]ERY [facility];  Service: Endoscopy;  Laterality: N/A;  KEEP THIS PATIENT FIRST   JOINT REPLACEMENT     LEFT HEART CATH AND CORONARY ANGIOGRAPHY Left 05/02/2019   Procedure: LEFT HEART CATH AND CORONARY ANGIOGRAPHY;  Surgeon: Perla Evalene PARAS, MD;  Location: [facility] INVASIVE CV LAB;  Service: Cardiovascular;  Laterality: Left;   LUMBAR LAMINECTOMY  2017   Procedure: LUMBAR LAMINECTOMY (L2-L4)   LYMPH NODE REMOVAL (NECK)     MOHS SURGERY  07/2016   BCCA nose   POLYPECTOMY N/A 03/21/2018   Procedure: POLYPECTOMY;  Surgeon: Unk Audrey Skiff, MD;  Location: [facility];  Service: Endoscopy;  Laterality: N/A;   REPLACEMENT TOTAL KNEE Right 2013   TONSILLECTOMY     TOTAL HIP ARTHROPLASTY Right 10/09/2023   Procedure: ARTHROPLASTY, HIP, TOTAL, ANTERIOR APPROACH;  Surgeon: Audrey Hussar, MD;  Location: [facility] ORS;  Service: Orthopedics;  Laterality: Right;   TOTAL SHOULDER REPLACEMENT Right 2012    Allergies: Allergies as of 01/02/2024 - Review Complete 01/02/2024  Allergen Reaction Noted   Amoxicillin Hives, Shortness Of Breath, and Swelling 03/21/2017   Aspirin  Anaphylaxis and Shortness Of Breath 03/21/2017   Bacitracin-polymyx-neo-hc [bacitra-neomycin-polymyxin-hc] Rash 01/28/2022   Ciprofloxacin Rash 08/28/2017   Ciprofloxacin-dexamethasone  Anaphylaxis 03/21/2017   Crestor  [rosuvastatin ] Other (See Comments) 03/21/2017   Erythromycin Hives and Rash 03/21/2017   Hydrochlorothiazide Other (See Comments) and Palpitations 03/21/2017   Ibuprofen Other (See Comments) 03/21/2017   Morphine  and codeine Nausea And Vomiting 07/04/2017   Morpholine salicylate Nausea Only 01/28/2022   Omeprazole Palpitations and Other (See Comments) 03/21/2017   Doxycycline hyclate Hives 03/21/2017   Keflex  [cephalexin ] Hives 03/21/2017   Lovastatin Other (See Comments) 03/21/2017   Simvastatin Other (See Comments) 03/21/2017   Bacitracin  10/13/2023   Dexamethasone   10/13/2023   Silicone Dermatitis and Hives 03/21/2017   Neomycin-bacitracin zn-polymyx Hives and Rash 03/21/2017   Tape Hives and Rash 03/21/2017    Medications: Outpatient Encounter Medications as of 01/02/2024  Medication Sig   acetaminophen  (TYLENOL ) 325 MG tablet Take 1-2 tablets (325-650 mg total) by mouth every 6 (six) hours as needed for mild pain (pain score 1-3) (  or temp > 100.5).   B Complex-C (B-COMPLEX WITH VITAMIN C) tablet Take 1 tablet by mouth daily.    calcium  carbonate (OS-CAL - DOSED IN MG OF ELEMENTAL CALCIUM ) 1250 (500 Ca) MG tablet Take 1 tablet by mouth daily with breakfast.   carvedilol  (COREG ) 6.25 MG tablet Take 1 tablet (6.25 mg total) by mouth 2 (two) times daily with a meal. Please call [phone] to schedule an appointment with Dr. Timothy Gollan for future refills. Thank you. 1st attempt.   cholecalciferol  (VITAMIN D ) 25 MCG (1000 UT) tablet Take 1,000 Units by mouth at bedtime.   Cranberry 1000 MG CAPS Take 1,000 mg by mouth daily.    ezetimibe  (ZETIA ) 10 MG tablet Take 1 tablet (10 mg total) by mouth daily.   fluticasone  (FLOVENT  HFA) 110 MCG/ACT inhaler Inhale 1 puff into  the lungs every 12 (twelve) hours as needed.   gabapentin  (NEURONTIN ) 300 MG capsule Take 1 capsule (300 mg total) by mouth 3 (three) times daily. Give 3 capsules by mouth at bedtime.   HYDROcodone -acetaminophen  (NORCO/VICODIN) 5-325 MG tablet Take 1 tablet by mouth every 6 (six) hours as needed for moderate pain (pain score 4-6).   loratadine (CLARITIN) 10 MG tablet Take 10 mg by mouth daily as needed for allergies.   methocarbamol  (ROBAXIN ) 500 MG tablet Take 1 tablet (500 mg total) by mouth every 8 (eight) hours as needed.   Multiple Vitamins-Minerals (MULTIVITAMIN WITH MINERALS) tablet Take 1 tablet by mouth daily. Centrum Silver   ondansetron  (ZOFRAN ) 4 MG tablet Take 1 tablet (4 mg total) by mouth every 6 (six) hours as needed for nausea.   polyethylene glycol (MIRALAX  / GLYCOLAX ) 17 g packet Take 17 g by mouth daily as needed.   sacubitril -valsartan  (ENTRESTO ) 24-26 MG TAKE 1 TABLET BY MOUTH TWICE A DAY   spironolactone  (ALDACTONE ) 25 MG tablet Take 1 tablet (25 mg total) by mouth daily.   traMADol  (ULTRAM ) 50 MG tablet Take 1 tablet (50 mg total) by mouth every 6 (six) hours as needed for moderate pain (pain score 4-6).   Turmeric 500 MG CAPS Take 500 mg by mouth daily.    No facility-administered encounter medications on file as of 01/02/2024.    Social History: Social History   Tobacco Use   Smoking status: Never   Smokeless tobacco: Never  Vaping Use   Vaping status: Never Used  Substance Use Topics   Alcohol use: Not Currently   Drug use: No    Family Medical History: Family History  Problem Relation Age of Onset   Diabetes Mother    Stroke Mother    Valvular heart disease Father    CAD Sister 75       died of MI   Stroke Sister    Stroke Sister 60   Clotting disorder Half-Sister    Clotting disorder Half-Sister    Breast cancer Neg Hx     Physical Examination: @VITALWITHPAIN @  General: Patient is well developed, well nourished, calm, collected, and in no  apparent distress. Attention to examination is appropriate.  Psychiatric: Patient is non-anxious.  Head:  Pupils equal, round, and reactive to light.  ENT:  Oral mucosa appears well hydrated.  Neck:   Supple.    Respiratory: Patient is breathing without any difficulty.  Extremities: No edema.  Vascular: Palpable dorsal pedal pulses.  Skin:   On exposed skin, there are no abnormal skin lesions.  NEUROLOGICAL:     Awake, alert, oriented to person, place, and time.  Speech is clear and fluent. Fund of knowledge is appropriate.   Cranial Nerves: Pupils equal round and reactive to light.  Facial tone is symmetric.   ROM of spine: Some increased tenderness palpation of her lumbar spine.  Strength:  Side Iliopsoas Quads Hamstring PF DF EHL  R 5 5 5 5 5 5   L 5 5 5 5 5 5     Medical Decision Making  Imaging: EXAM: CT OF THE LUMBAR SPINE WITHOUT CONTRAST 12/09/2023 09:48:08 AM   TECHNIQUE: CT of the lumbar spine was performed without the administration of intravenous contrast. Multiplanar reformatted images are provided for review. Automated exposure control, iterative reconstruction, and/or weight based adjustment of the mA/kV was utilized to reduce the radiation dose to as low as reasonably achievable.   COMPARISON: 2 lumbar spine radiographs 02/29/2024 and MRI of the lumbar spine 10/08/2016.   CLINICAL HISTORY: Low back pain began 1 month ago after pulling a heavy bag of dog food.   FINDINGS:   BONES AND ALIGNMENT: The inferior endplate fracture at L2 and superior endplate fracture at L3 are remote. The L1 superior endplate compression fracture is subacute and incompletely healed. 50% loss of height is present. Retropulsed bone extends 5 mm into the spinal canal.   DEGENERATIVE CHANGES: A broad-based disc protrusion at L2-3 results in moderate foraminal stenosis bilaterally. The central canal is decompressed secondary to left laminectomy.   Severe left and  moderate right foraminal stenosis again noted at L3-4. The central canal is decompressed by left laminectomy.   Severe left foraminal stenosis at L4-5 is stable. Mild right foraminal narrowing is present. Moderate central canal stenosis is present.   L5-S1: Moderate left and mild right foraminal stenosis is secondary to facet spurring.   SOFT TISSUES: Atherosclerotic calcifications are present in the aorta and branch vessels without aneurysm. A 6.3 cm simple cyst is present posteriorly in the right kidney. Coronary artery calcifications are present.   IMPRESSION: 1. Subacute, incompletely healed L1 superior endplate compression fracture with 50% height loss and 5 mm retropulsion into the spinal canal. 2. Remote inferior endplate fracture at L2 and superior endplate fracture at L3. 3. Multilevel degenerative changes as detailed above, including severe left foraminal stenosis at L4-5 (stable) and moderate central canal stenosis at L4-5.    I have personally reviewed the images and agree with the above interpretation.  Assessment and Plan: Ms. Vanscyoc is a pleasant 78 y.o. female s here today with a chief complaint of L1 compression fracture believed to be suffered approximately 6 weeks ago when she was lifting a bag of dog food.  She states that she still has some back pain, denies any leg pain.  She has not been wearing her TLSO brace.  The pain is intermittent primarily and she has been using tramadol  and hydrocodone .  She is undergoing physical therapy once a week.  Denies any new weakness, numbness or tingling.  Her examination is to baseline.  Her imaging was reviewed at length which shows subacute L1 superior endplate compression fracture with 50% height loss.  New x-rays were obtained today prior to clinic.  Will review results once complete.  Thankfully, patient is doing well considering known compression fracture.  She was instructed that she may wear her TLSO for comfort, but it  is not required at this point.  She still experiencing some bouts of intense pain and refill of hydrocodone  was sent.  Red flag symptoms reviewed with patient and her son.  Plan to follow-up on  an as-needed basis.  Thank you for involving me in the care of this patient.   I spent a total of 30 minutes in both face-to-face and non-face-to-face activities for this visit on the date of this encounter including preparing to see the patient, obtaining and reviewing separately obtained history, performing medically appropriate examination, counseling the patient and their family,  documenting clinical information, independently interpreting results.  Lyle Decamp, PA-C Dept. of Neurosurgery

## 2024-01-03 DIAGNOSIS — I471 Supraventricular tachycardia, unspecified: Secondary | ICD-10-CM | POA: Diagnosis not present

## 2024-01-03 DIAGNOSIS — E7800 Pure hypercholesterolemia, unspecified: Secondary | ICD-10-CM | POA: Diagnosis not present

## 2024-01-03 DIAGNOSIS — G629 Polyneuropathy, unspecified: Secondary | ICD-10-CM | POA: Diagnosis not present

## 2024-01-03 DIAGNOSIS — M1611 Unilateral primary osteoarthritis, right hip: Secondary | ICD-10-CM | POA: Diagnosis not present

## 2024-01-03 DIAGNOSIS — K219 Gastro-esophageal reflux disease without esophagitis: Secondary | ICD-10-CM | POA: Diagnosis not present

## 2024-01-03 DIAGNOSIS — J45909 Unspecified asthma, uncomplicated: Secondary | ICD-10-CM | POA: Diagnosis not present

## 2024-01-04 MED ORDER — HYDROCODONE-ACETAMINOPHEN 5-325 MG PO TABS: 1.0000 | ORAL_TABLET | Freq: Four times a day (QID) | ORAL | 0 refills | Status: AC | PRN

## 2024-01-12 ENCOUNTER — Telehealth: Payer: Self-pay | Admitting: Internal Medicine

## 2024-01-12 DIAGNOSIS — G629 Polyneuropathy, unspecified: Secondary | ICD-10-CM | POA: Diagnosis not present

## 2024-01-12 DIAGNOSIS — I471 Supraventricular tachycardia, unspecified: Secondary | ICD-10-CM | POA: Diagnosis not present

## 2024-01-12 DIAGNOSIS — M1611 Unilateral primary osteoarthritis, right hip: Secondary | ICD-10-CM | POA: Diagnosis not present

## 2024-01-12 DIAGNOSIS — J45909 Unspecified asthma, uncomplicated: Secondary | ICD-10-CM | POA: Diagnosis not present

## 2024-01-12 DIAGNOSIS — E7800 Pure hypercholesterolemia, unspecified: Secondary | ICD-10-CM | POA: Diagnosis not present

## 2024-01-12 DIAGNOSIS — K219 Gastro-esophageal reflux disease without esophagitis: Secondary | ICD-10-CM | POA: Diagnosis not present

## 2024-01-12 NOTE — Telephone Encounter (Signed)
 Copied from CRM #[id number]. Topic: General - Other >> Jan 12, 2024  2:00 PM Roselie BROCKS wrote: Reason for CRM: Patient needs paper work sent to her ,to correct FMLA paperwork  To show from 4 hours ,changed to 8 hours for appnt on August 5th

## 2024-01-15 NOTE — Telephone Encounter (Signed)
 Re-faxed documents to fax # with new dates.  CM

## 2024-01-17 ENCOUNTER — Other Ambulatory Visit: Payer: Self-pay | Admitting: Internal Medicine

## 2024-01-17 ENCOUNTER — Telehealth: Payer: Self-pay

## 2024-01-17 ENCOUNTER — Other Ambulatory Visit: Payer: Self-pay | Admitting: Adult Health

## 2024-01-17 DIAGNOSIS — G629 Polyneuropathy, unspecified: Secondary | ICD-10-CM | POA: Diagnosis not present

## 2024-01-17 DIAGNOSIS — J45909 Unspecified asthma, uncomplicated: Secondary | ICD-10-CM | POA: Diagnosis not present

## 2024-01-17 DIAGNOSIS — E7800 Pure hypercholesterolemia, unspecified: Secondary | ICD-10-CM | POA: Diagnosis not present

## 2024-01-17 DIAGNOSIS — K219 Gastro-esophageal reflux disease without esophagitis: Secondary | ICD-10-CM | POA: Diagnosis not present

## 2024-01-17 DIAGNOSIS — I5020 Unspecified systolic (congestive) heart failure: Secondary | ICD-10-CM

## 2024-01-17 DIAGNOSIS — M1611 Unilateral primary osteoarthritis, right hip: Secondary | ICD-10-CM | POA: Diagnosis not present

## 2024-01-17 DIAGNOSIS — Z9089 Acquired absence of other organs: Secondary | ICD-10-CM | POA: Diagnosis not present

## 2024-01-17 DIAGNOSIS — I471 Supraventricular tachycardia, unspecified: Secondary | ICD-10-CM | POA: Diagnosis not present

## 2024-01-17 MED ORDER — FLUTICASONE PROPIONATE HFA 110 MCG/ACT IN AERO: 1.0000 | INHALATION_SPRAY | Freq: Two times a day (BID) | RESPIRATORY_TRACT | 3 refills | Status: DC

## 2024-01-17 NOTE — Telephone Encounter (Signed)
>>   Jan 17, 2024  1:49 PM Alfonso ORN wrote: Patient stated alternative medication patient has been given in the pass do not and she will not be able to breath if give her an alternative medication   Copied from CRM #[id number]. Topic: Clinical - Medication Refill >> Jan 17, 2024  1:40 PM Alfonso ORN wrote: Medication: fluticasone  (FLOVENT  HFA) 110 MCG/ACT inhaler Has the patient contacted their pharmacy? Yes ,pharmacy stated they could not fill the prescription  (Agent: If no, request that the patient contact the pharmacy for the refill. If patient does not wish to contact the pharmacy document the reason why and proceed with request.) (Agent: If yes, when and what did the pharmacy advise?)  This is the patient's preferred pharmacy:  [facility] [phone] [address], [place] - [address] [place] [address] Phone: [phone] Fax: [phone]   Is this the correct pharmacy for this prescription? Yes If no, delete pharmacy and type the correct one.   Has the prescription been filled recently? No  Is the patient out of the medication? No , if patient run out of the inhaler patient stated she will not be able to breath  Has the patient been seen for an appointment in the last year OR does the patient have an upcoming appointment? Yes  Can we respond through MyChart? No  Agent: Please be advised that Rx refills may take up to 3 business days. We ask that you follow-up with your pharmacy.

## 2024-01-17 NOTE — Telephone Encounter (Signed)
 Copied from CRM #[id number]. Topic: Clinical - Medication Refill >> Jan 17, 2024  1:40 PM Alfonso ORN wrote: Medication: fluticasone  (FLOVENT  HFA) 110 MCG/ACT inhaler Has the patient contacted their pharmacy? Yes ,pharmacy stated they could not fill the prescription  (Agent: If no, request that the patient contact the pharmacy for the refill. If patient does not wish to contact the pharmacy document the reason why and proceed with request.) (Agent: If yes, when and what did the pharmacy advise?)  This is the patient's preferred pharmacy:  [facility] [phone] [address], [place] - [address] [place] [address] Phone: [phone] Fax: [phone]   Is this the correct pharmacy for this prescription? Yes If no, delete pharmacy and type the correct one.   Has the prescription been filled recently? No  Is the patient out of the medication? No , if patient run out of the inhaler patient stated she will not be able to breath  Has the patient been seen for an appointment in the last year OR does the patient have an upcoming appointment? Yes  Can we respond through MyChart? No  Agent: Please be advised that Rx refills may take up to 3 business days. We ask that you follow-up with your pharmacy.

## 2024-01-17 NOTE — Progress Notes (Unsigned)
 Date:  01/17/2024   Name:  Audrey Martinez   DOB:  12/31/1945   MRN:  [id number]   Chief Complaint: No chief complaint on file.  HPI  Review of Systems   Lab Results  Component Value Date   NA 141 11/14/2023   K 3.5 11/14/2023   CO2 24 11/14/2023   GLUCOSE 101 (H) 11/14/2023   BUN 27 (H) 11/14/2023   CREATININE 0.94 11/14/2023   CALCIUM  9.1 11/14/2023   EGFR 70 10/16/2023   GFRNONAA >60 11/14/2023   Lab Results  Component Value Date   CHOL 175 06/26/2023   HDL 59 06/26/2023   LDLCALC 96 06/26/2023   TRIG 99 06/26/2023   CHOLHDL 3.0 06/26/2023   Lab Results  Component Value Date   TSH 2.630 06/24/2022   Lab Results  Component Value Date   HGBA1C 5.1 06/25/2023   Lab Results  Component Value Date   WBC 6.4 11/14/2023   HGB 12.6 11/14/2023   HCT 38.2 11/14/2023   MCV 93.4 11/14/2023   PLT 177 11/14/2023   Lab Results  Component Value Date   ALT 14 11/13/2023   AST 19 11/13/2023   ALKPHOS 69 11/13/2023   BILITOT 1.1 11/13/2023   Lab Results  Component Value Date   VD25OH 34.76 11/25/2019     Patient Active Problem List   Diagnosis Date Noted   UTI (urinary tract infection) 11/13/2023   Obesity, morbid (HCC) 10/16/2023   Headache 10/11/2023   Fever 10/11/2023   S/P total right hip arthroplasty 10/09/2023   Problem related to living alone 08/08/2023   TIA (transient ischemic attack) 06/25/2023   CAD (coronary artery disease) 06/25/2023   Chronic kidney disease, stage 3a (HCC) 06/25/2023   Age-related cognitive decline 06/15/2023   Encounter for long-term (current) use of non-steroidal anti-inflammatories 06/15/2023   Personal history of PE (pulmonary embolism) 06/15/2023   Cause of injury, accidental fall, subsequent encounter 06/15/2023   Problem related to health literacy 06/15/2023   Personal history of fall 06/15/2023   Osteoarthritis of right hip joint due to dysplasia 03/14/2023   Osteoarthritis of right hip 03/14/2023    Constipation 12/16/2022   HFrEF (heart failure with reduced ejection fraction) (HCC) 06/24/2022   Melanoma in situ of neck (HCC) 06/22/2021   Trochanteric bursitis of right hip 05/18/2021   Hx of colonic polyps    Atrial flutter (HCC) 01/31/2020   Obesity (BMI 30-39.9) 01/31/2020   Left lower lobe pulmonary nodule 01/29/2020   Dilated cardiomyopathy (HCC) 05/02/2019   Amaurosis fugax of right eye 01/13/2019   Lumbar radiculopathy 05/18/2017   Lumbar degenerative disc disease 05/18/2017   Spinal stenosis of lumbar region without neurogenic claudication 05/18/2017   Atrial tachycardia (HCC) 04/14/2017   SVT (supraventricular tachycardia) (HCC) 04/14/2017   Essential hypertension 04/14/2017   Intermittent asthma without complication 04/14/2017   Osteoporosis 03/21/2017   Benign essential tremor 03/21/2017   Closed fracture of proximal end of left fibula 03/10/2015   Pain of right hip joint 12/10/2013   History of arthroscopy of shoulder 09/14/2011   Hx of atrioventricular node ablation 02/01/2008   Controlled mild persistent asthma 06/04/2004   Mixed hyperlipidemia 05/12/2003    Allergies  Allergen Reactions   Amoxicillin Hives, Shortness Of Breath and Swelling    Has tolerated a 1st generation cephalosporin (CEFAZOLIN) in the past without documented ADRs.    Aspirin Anaphylaxis and Shortness Of Breath   Bacitracin-Polymyx-Neo-Hc [Bacitra-Neomycin-Polymyxin-Hc] Rash   Ciprofloxacin Rash   Ciprofloxacin-Dexamethasone  Anaphylaxis  Crestor  [Rosuvastatin ] Other (See Comments)    Caused problems with memory.   Erythromycin Hives and Rash   Hydrochlorothiazide Other (See Comments) and Palpitations    Raises her blood pressure.    Ibuprofen Other (See Comments)    Sharp stomach pains   Morphine  And Codeine Nausea And Vomiting   Morpholine Salicylate Nausea Only   Omeprazole Palpitations and Other (See Comments)    Tachycardia and same sensation as her SVTs per patient    Doxycycline Hyclate Hives   Keflex  [Cephalexin ] Hives    Has tolerated a 1st generation cephalosporin (CEFAZOLIN) in the past without documented ADRs.     Lovastatin Other (See Comments)    Unable to function   Simvastatin Other (See Comments)    Unable to function   Bacitracin    Dexamethasone     Silicone Dermatitis and Hives   Neomycin-Bacitracin Zn-Polymyx Hives and Rash   Tape Hives and Rash    Past Surgical History:  Procedure Laterality Date   APPENDECTOMY     BREAST CYST ASPIRATION Left 1977   CARDIAC CATHETERIZATION  2009   [facility] [place] [facility]   CARDIAC ELECTROPHYSIOLOGY STUDY AND ABLATION  2009   CATARACT EXTRACTION, BILATERAL  2010   COLONOSCOPY WITH PROPOFOL  N/A 03/21/2018   Procedure: COLONOSCOPY WITH Biopsy;  Surgeon: Unk Corinn Skiff, MD;  Location: [facility];  Service: Endoscopy;  Laterality: N/A;   COLONOSCOPY WITH PROPOFOL  N/A 05/03/2021   Procedure: COLONOSCOPY WITH PROPOFOL ;  Surgeon: Unk Corinn Skiff, MD;  Location: [facility] ENDOSCOPY;  Service: Gastroenterology;  Laterality: N/A;   CORONARY ANGIOPLASTY     DARRACH'S PROCEDURE Bilateral 1977   ESOPHAGOGASTRODUODENOSCOPY  2013   gastritis; done for complaint of dysphagia   ESOPHAGOGASTRODUODENOSCOPY (EGD) WITH PROPOFOL  N/A 03/21/2018   Procedure: ESOPHAGOGASTRODUODENOSCOPY (EGD) WITH Biopsies;  Surgeon: Unk Corinn Skiff, MD;  Location: [facility];  Service: Endoscopy;  Laterality: N/A;  KEEP THIS PATIENT FIRST   JOINT REPLACEMENT     LEFT HEART CATH AND CORONARY ANGIOGRAPHY Left 05/02/2019   Procedure: LEFT HEART CATH AND CORONARY ANGIOGRAPHY;  Surgeon: Perla Evalene PARAS, MD;  Location: [facility] INVASIVE CV LAB;  Service: Cardiovascular;  Laterality: Left;   LUMBAR LAMINECTOMY  2017   Procedure: LUMBAR LAMINECTOMY (L2-L4)   LYMPH NODE REMOVAL (NECK)     MOHS SURGERY  07/2016   BCCA nose   POLYPECTOMY N/A 03/21/2018   Procedure: POLYPECTOMY;  Surgeon: Unk Corinn Skiff, MD;  Location: [facility];  Service: Endoscopy;  Laterality: N/A;   REPLACEMENT TOTAL KNEE Right 2013   TONSILLECTOMY     TOTAL HIP ARTHROPLASTY Right 10/09/2023   Procedure: ARTHROPLASTY, HIP, TOTAL, ANTERIOR APPROACH;  Surgeon: Lorelle Hussar, MD;  Location: [facility] ORS;  Service: Orthopedics;  Laterality: Right;   TOTAL SHOULDER REPLACEMENT Right 2012    Social History   Tobacco Use   Smoking status: Never   Smokeless tobacco: Never  Vaping Use   Vaping status: Never Used  Substance Use Topics   Alcohol use: Not Currently   Drug use: No     Medication list has been reviewed and updated.  No outpatient medications have been marked as taking for the 01/17/24 encounter (Orders Only) with [name] DEL, MD.       11/29/2023    3:58 PM 07/06/2023    8:30 AM 06/24/2022    8:12 AM 03/28/2022   10:39 AM  GAD 7 : Generalized Anxiety Score  Nervous, Anxious, on Edge 0 0 0 0  Control/stop worrying 0 0 0 0  Worry too much - different things 0 0 0 0  Trouble relaxing 0 0 0 0  Restless 0 0 0 0  Easily annoyed or irritable 0 0 0 0  Afraid - awful might happen 0 0 0 0  Total GAD 7 Score 0 0 0 0  Anxiety Difficulty Not difficult at all Not difficult at all Not difficult at all Not difficult at all       11/29/2023    3:58 PM 11/16/2023    2:53 PM 11/10/2023    4:15 PM  Depression screen PHQ 2/9  Decreased Interest 0 0 0  Down, Depressed, Hopeless 0 0 0  PHQ - 2 Score 0 0 0  Altered sleeping 1  0  Tired, decreased energy 1  0  Change in appetite 0  0  Feeling bad or failure about yourself  0  0  Trouble concentrating 0  1  Moving slowly or fidgety/restless 0  0  Suicidal thoughts 0  0  PHQ-9 Score 2  1  Difficult doing work/chores Not difficult at all  Not difficult at all    BP Readings from Last 3 Encounters:  01/02/24 114/74  12/09/23 (!) 120/95  11/29/23 102/74    Physical Exam  Wt Readings from Last 3 Encounters:  01/02/24 200 lb (90.7 kg)   12/09/23 200 lb (90.7 kg)  11/29/23 195 lb (88.5 kg)    There were no vitals taken for this visit.  Assessment and Plan:  Problem List Items Addressed This Visit   None   No follow-ups on file.    Leita HILARIO Adie, MD [facility] [place]

## 2024-01-22 ENCOUNTER — Telehealth: Payer: Self-pay

## 2024-01-22 ENCOUNTER — Ambulatory Visit
Admission: RE | Admit: 2024-01-22 | Discharge: 2024-01-22 | Disposition: A | Discharge status: Home / Self Care | Source: Ambulatory Visit | Attending: Internal Medicine | Admitting: Internal Medicine

## 2024-01-22 DIAGNOSIS — Z1231 Encounter for screening mammogram for malignant neoplasm of breast: Secondary | ICD-10-CM | POA: Diagnosis not present

## 2024-01-22 NOTE — Telephone Encounter (Signed)
 Copied from CRM #[id number]. Topic: Clinical - Medication Prior Auth >> Jan 22, 2024  3:26 PM Debby BROCKS wrote: Reason for CRM: Santana (nurse case manager from [facility])  Wants to send a message the the PcP and care team regarding the patient: The patient will need a prescription for her mood as she's becoming more aggressive Patient was diagnosed with arthritis from bone density scan and will need a prescription  The son wants to revoke the patients license as she should not be driving and wants to know if the linic can provide a letter to present to the [facility]  The son is the caretaker for her Callback: [phone]

## 2024-01-22 NOTE — Patient Outreach (Signed)
 Call from Leita Cong at [facility]/GUIDE program.  States she spoke with son who reports: (1) Things are getting overwhelming for son and patient, patient has had episodes of aggressive behavior. kicked down a dog gate, getting combative, inquiring if she can be prescribed something for mood ? (2) Had a bone density scan, diagnosed with osteoarthritis, needs to schedule an appointment to get a prescription for medication to treat. (3) She was demanding car keys from the son, he eventually gave in and gave her the keys. He's asking about process for getting her license revoked, can PCP send something to [facility]?   If PCP prescribed any new meds, Leita is requesting this RNCM keep her informed by email.   If son feels patient is ready to be moved to [place], Leita will contact a team at [facility] to help son tour some facilities.

## 2024-01-22 NOTE — Patient Outreach (Signed)
 Call made to Primary Care & sports Medicine, left message with staff for Dr. Justus to address:  Call from Audrey Martinez at [facility]/GU[facility] program.  States she spoke with son who reports: (1) Things are getting overwhelming for son and patient, patient has had episodes of aggressive behavior. kicked down a dog gate, getting combative, inquiring if she can be prescribed something for mood ? (2) Had a bone density scan, diagnosed with osteoarthritis, needs to schedule an appointment to get a prescription for medication to treat. (3) She was demanding car keys from the son, he eventually gave in and gave her the keys. He's asking about process for getting her license revoked, can PCP send something to [facility]?

## 2024-01-23 NOTE — Telephone Encounter (Signed)
 Please schedule pt a appt virtual is ok.  KP

## 2024-01-24 ENCOUNTER — Telehealth: Payer: Self-pay

## 2024-01-24 DIAGNOSIS — K219 Gastro-esophageal reflux disease without esophagitis: Secondary | ICD-10-CM | POA: Diagnosis not present

## 2024-01-24 DIAGNOSIS — J45909 Unspecified asthma, uncomplicated: Secondary | ICD-10-CM | POA: Diagnosis not present

## 2024-01-24 DIAGNOSIS — G629 Polyneuropathy, unspecified: Secondary | ICD-10-CM | POA: Diagnosis not present

## 2024-01-24 DIAGNOSIS — E7800 Pure hypercholesterolemia, unspecified: Secondary | ICD-10-CM | POA: Diagnosis not present

## 2024-01-24 DIAGNOSIS — M1611 Unilateral primary osteoarthritis, right hip: Secondary | ICD-10-CM | POA: Diagnosis not present

## 2024-01-24 DIAGNOSIS — I471 Supraventricular tachycardia, unspecified: Secondary | ICD-10-CM | POA: Diagnosis not present

## 2024-01-28 DIAGNOSIS — I471 Supraventricular tachycardia, unspecified: Secondary | ICD-10-CM | POA: Diagnosis not present

## 2024-01-28 DIAGNOSIS — K219 Gastro-esophageal reflux disease without esophagitis: Secondary | ICD-10-CM | POA: Diagnosis not present

## 2024-01-28 DIAGNOSIS — G629 Polyneuropathy, unspecified: Secondary | ICD-10-CM | POA: Diagnosis not present

## 2024-01-28 DIAGNOSIS — M1611 Unilateral primary osteoarthritis, right hip: Secondary | ICD-10-CM | POA: Diagnosis not present

## 2024-01-28 DIAGNOSIS — J45909 Unspecified asthma, uncomplicated: Secondary | ICD-10-CM | POA: Diagnosis not present

## 2024-01-28 DIAGNOSIS — Z9089 Acquired absence of other organs: Secondary | ICD-10-CM | POA: Diagnosis not present

## 2024-01-28 DIAGNOSIS — Z96651 Presence of right artificial knee joint: Secondary | ICD-10-CM | POA: Diagnosis not present

## 2024-01-28 DIAGNOSIS — E7800 Pure hypercholesterolemia, unspecified: Secondary | ICD-10-CM | POA: Diagnosis not present

## 2024-01-29 DIAGNOSIS — F02A0 Dementia in other diseases classified elsewhere, mild, without behavioral disturbance, psychotic disturbance, mood disturbance, and anxiety: Secondary | ICD-10-CM | POA: Diagnosis not present

## 2024-01-31 ENCOUNTER — Encounter: Payer: Self-pay | Admitting: Internal Medicine

## 2024-01-31 ENCOUNTER — Ambulatory Visit (INDEPENDENT_AMBULATORY_CARE_PROVIDER_SITE_OTHER): Admitting: Internal Medicine

## 2024-01-31 VITALS — BP 128/84 | HR 95 | Ht 62.0 in | Wt 200.0 lb

## 2024-01-31 DIAGNOSIS — E7800 Pure hypercholesterolemia, unspecified: Secondary | ICD-10-CM | POA: Diagnosis not present

## 2024-01-31 DIAGNOSIS — G629 Polyneuropathy, unspecified: Secondary | ICD-10-CM | POA: Diagnosis not present

## 2024-01-31 DIAGNOSIS — M8000XD Age-related osteoporosis with current pathological fracture, unspecified site, subsequent encounter for fracture with routine healing: Secondary | ICD-10-CM | POA: Diagnosis not present

## 2024-01-31 DIAGNOSIS — I10 Essential (primary) hypertension: Secondary | ICD-10-CM | POA: Diagnosis not present

## 2024-01-31 DIAGNOSIS — K219 Gastro-esophageal reflux disease without esophagitis: Secondary | ICD-10-CM | POA: Diagnosis not present

## 2024-01-31 DIAGNOSIS — M48061 Spinal stenosis, lumbar region without neurogenic claudication: Secondary | ICD-10-CM | POA: Diagnosis not present

## 2024-01-31 DIAGNOSIS — M1611 Unilateral primary osteoarthritis, right hip: Secondary | ICD-10-CM | POA: Diagnosis not present

## 2024-01-31 DIAGNOSIS — I471 Supraventricular tachycardia, unspecified: Secondary | ICD-10-CM | POA: Diagnosis not present

## 2024-01-31 DIAGNOSIS — J45909 Unspecified asthma, uncomplicated: Secondary | ICD-10-CM | POA: Diagnosis not present

## 2024-01-31 MED ORDER — ALENDRONATE SODIUM 70 MG PO TABS: 70.0000 mg | ORAL_TABLET | ORAL | 1 refills | Status: AC

## 2024-01-31 MED ORDER — GABAPENTIN 300 MG PO CAPS: ORAL_CAPSULE | ORAL | 5 refills | Status: AC

## 2024-01-31 NOTE — Progress Notes (Signed)
 Date:  01/31/2024   Name:  Audrey Martinez   DOB:  1945-08-12   MRN:  [id number]   Chief Complaint: Results (Bone density results.)  Back Pain This is a chronic problem. The current episode started more than 1 month ago. The problem has been gradually improving since onset. The pain is present in the lumbar spine. The quality of the pain is described as aching. Pertinent negatives include no chest pain or headaches.  Osteoporosis - found Dexa at [facility], here to discuss medication.  She is already taking calcium  and vitamin D  daily.  Her back is improving and she is still doing home PT once a week.  She is getting around with her walker doing chores at home. She has some pain but it is much improved. Her hip is doing great with minimal pain and excellent range of motion.   Review of Systems  Constitutional:  Negative for chills, diaphoresis and unexpected weight change.  Respiratory:  Negative for chest tightness and shortness of breath.   Cardiovascular:  Negative for chest pain and leg swelling.  Musculoskeletal:  Positive for back pain.  Neurological:  Negative for dizziness and headaches.  Psychiatric/Behavioral:  Negative for dysphoric mood and sleep disturbance. The patient is not nervous/anxious.        Son had reported some irritability and anger over wanting to drive     Lab Results  Component Value Date   NA 141 11/14/2023   K 3.5 11/14/2023   CO2 24 11/14/2023   GLUCOSE 101 (H) 11/14/2023   BUN 27 (H) 11/14/2023   CREATININE 0.94 11/14/2023   CALCIUM  9.1 11/14/2023   EGFR 70 10/16/2023   GFRNONAA >60 11/14/2023   Lab Results  Component Value Date   CHOL 175 06/26/2023   HDL 59 06/26/2023   LDLCALC 96 06/26/2023   TRIG 99 06/26/2023   CHOLHDL 3.0 06/26/2023   Lab Results  Component Value Date   TSH 2.630 06/24/2022   Lab Results  Component Value Date   HGBA1C 5.1 06/25/2023   Lab Results  Component Value Date   WBC 6.4 11/14/2023   HGB 12.6  11/14/2023   HCT 38.2 11/14/2023   MCV 93.4 11/14/2023   PLT 177 11/14/2023   Lab Results  Component Value Date   ALT 14 11/13/2023   AST 19 11/13/2023   ALKPHOS 69 11/13/2023   BILITOT 1.1 11/13/2023   Lab Results  Component Value Date   VD25OH 34.76 11/25/2019     Patient Active Problem List   Diagnosis Date Noted   UTI (urinary tract infection) 11/13/2023   Obesity, morbid (HCC) 10/16/2023   Headache 10/11/2023   Fever 10/11/2023   S/P total right hip arthroplasty 10/09/2023   Problem related to living alone 08/08/2023   TIA (transient ischemic attack) 06/25/2023   CAD (coronary artery disease) 06/25/2023   Chronic kidney disease, stage 3a (HCC) 06/25/2023   Age-related cognitive decline 06/15/2023   Encounter for long-term (current) use of non-steroidal anti-inflammatories 06/15/2023   Personal history of PE (pulmonary embolism) 06/15/2023   Cause of injury, accidental fall, subsequent encounter 06/15/2023   Problem related to health literacy 06/15/2023   Personal history of fall 06/15/2023   Osteoarthritis of right hip joint due to dysplasia 03/14/2023   Osteoarthritis of right hip 03/14/2023   Constipation 12/16/2022   HFrEF (heart failure with reduced ejection fraction) (HCC) 06/24/2022   Melanoma in situ of neck (HCC) 06/22/2021   Trochanteric bursitis of right hip  05/18/2021   Hx of colonic polyps    Atrial flutter (HCC) 01/31/2020   Obesity (BMI 30-39.9) 01/31/2020   Left lower lobe pulmonary nodule 01/29/2020   Dilated cardiomyopathy (HCC) 05/02/2019   Amaurosis fugax of right eye 01/13/2019   Lumbar radiculopathy 05/18/2017   Lumbar degenerative disc disease 05/18/2017   Spinal stenosis of lumbar region without neurogenic claudication 05/18/2017   Atrial tachycardia (HCC) 04/14/2017   SVT (supraventricular tachycardia) (HCC) 04/14/2017   Essential hypertension 04/14/2017   Intermittent asthma without complication 04/14/2017   Osteoporosis 03/21/2017    Benign essential tremor 03/21/2017   Closed fracture of proximal end of left fibula 03/10/2015   Pain of right hip joint 12/10/2013   History of arthroscopy of shoulder 09/14/2011   Hx of atrioventricular node ablation 02/01/2008   Controlled mild persistent asthma 06/04/2004   Mixed hyperlipidemia 05/12/2003    Allergies  Allergen Reactions   Amoxicillin Hives, Shortness Of Breath and Swelling    Has tolerated a 1st generation cephalosporin (CEFAZOLIN) in the past without documented ADRs.    Aspirin Anaphylaxis and Shortness Of Breath   Bacitracin-Polymyx-Neo-Hc [Bacitra-Neomycin-Polymyxin-Hc] Rash   Ciprofloxacin Rash   Ciprofloxacin-Dexamethasone  Anaphylaxis   Crestor  [Rosuvastatin ] Other (See Comments)    Caused problems with memory.   Erythromycin Hives and Rash   Hydrochlorothiazide Other (See Comments) and Palpitations    Raises her blood pressure.    Ibuprofen Other (See Comments)    Sharp stomach pains   Morphine  And Codeine Nausea And Vomiting   Morpholine Salicylate Nausea Only   Omeprazole Palpitations and Other (See Comments)    Tachycardia and same sensation as her SVTs per patient   Doxycycline Hyclate Hives   Keflex  [Cephalexin ] Hives    Has tolerated a 1st generation cephalosporin (CEFAZOLIN) in the past without documented ADRs.     Lovastatin Other (See Comments)    Unable to function   Simvastatin Other (See Comments)    Unable to function   Bacitracin    Dexamethasone     Silicone Dermatitis and Hives   Neomycin-Bacitracin Zn-Polymyx Hives and Rash   Tape Hives and Rash    Past Surgical History:  Procedure Laterality Date   APPENDECTOMY     BREAST CYST ASPIRATION Left 1977   CARDIAC CATHETERIZATION  2009   [facility] [place] [facility]   CARDIAC ELECTROPHYSIOLOGY STUDY AND ABLATION  2009   CATARACT EXTRACTION, BILATERAL  2010   COLONOSCOPY WITH PROPOFOL  N/A 03/21/2018   Procedure: COLONOSCOPY WITH Biopsy;  Surgeon: Unk Corinn Skiff, MD;  Location: [facility];  Service: Endoscopy;  Laterality: N/A;   COLONOSCOPY WITH PROPOFOL  N/A 05/03/2021   Procedure: COLONOSCOPY WITH PROPOFOL ;  Surgeon: Unk Corinn Skiff, MD;  Location: [facility] ENDOSCOPY;  Service: Gastroenterology;  Laterality: N/A;   CORONARY ANGIOPLASTY     DARRACH'S PROCEDURE Bilateral 1977   ESOPHAGOGASTRODUODENOSCOPY  2013   gastritis; done for complaint of dysphagia   ESOPHAGOGASTRODUODENOSCOPY (EGD) WITH PROPOFOL  N/A 03/21/2018   Procedure: ESOPHAGOGASTRODUODENOSCOPY (EGD) WITH Biopsies;  Surgeon: Unk Corinn Skiff, MD;  Location: [facility];  Service: Endoscopy;  Laterality: N/A;  KEEP THIS PATIENT FIRST   JOINT REPLACEMENT     LEFT HEART CATH AND CORONARY ANGIOGRAPHY Left 05/02/2019   Procedure: LEFT HEART CATH AND CORONARY ANGIOGRAPHY;  Surgeon: Perla Evalene PARAS, MD;  Location: [facility] INVASIVE CV LAB;  Service: Cardiovascular;  Laterality: Left;   LUMBAR LAMINECTOMY  2017   Procedure: LUMBAR LAMINECTOMY (L2-L4)   LYMPH NODE REMOVAL (NECK)  MOHS SURGERY  07/2016   BCCA nose   POLYPECTOMY N/A 03/21/2018   Procedure: POLYPECTOMY;  Surgeon: Unk Corinn Skiff, MD;  Location: [facility];  Service: Endoscopy;  Laterality: N/A;   REPLACEMENT TOTAL KNEE Right 2013   TONSILLECTOMY     TOTAL HIP ARTHROPLASTY Right 10/09/2023   Procedure: ARTHROPLASTY, HIP, TOTAL, ANTERIOR APPROACH;  Surgeon: Lorelle Hussar, MD;  Location: [facility] ORS;  Service: Orthopedics;  Laterality: Right;   TOTAL SHOULDER REPLACEMENT Right 2012    Social History   Tobacco Use   Smoking status: Never   Smokeless tobacco: Never  Vaping Use   Vaping status: Never Used  Substance Use Topics   Alcohol use: Not Currently   Drug use: No     Medication list has been reviewed and updated.  Current Meds  Medication Sig   acetaminophen  (TYLENOL ) 325 MG tablet Take 1-2 tablets (325-650 mg total) by mouth every 6 (six) hours as needed for mild pain  (pain score 1-3) (or temp > 100.5).   alendronate  (FOSAMAX ) 70 MG tablet Take 1 tablet (70 mg total) by mouth every 7 (seven) days. Take with a full glass of water  on an empty stomach.   B Complex-C (B-COMPLEX WITH VITAMIN C) tablet Take 1 tablet by mouth daily.    calcium  carbonate (OS-CAL - DOSED IN MG OF ELEMENTAL CALCIUM ) 1250 (500 Ca) MG tablet Take 1 tablet by mouth daily with breakfast.   carvedilol  (COREG ) 6.25 MG tablet Take 1 tablet (6.25 mg total) by mouth 2 (two) times daily with a meal. Please call [phone] to schedule an appointment with Dr. Timothy Gollan for future refills. Thank you. 1st attempt.   cholecalciferol  (VITAMIN D ) 25 MCG (1000 UT) tablet Take 1,000 Units by mouth at bedtime.   Cranberry 1000 MG CAPS Take 1,000 mg by mouth daily.    ezetimibe  (ZETIA ) 10 MG tablet Take 1 tablet (10 mg total) by mouth daily.   fluticasone  (FLOVENT  HFA) 110 MCG/ACT inhaler Inhale 1 puff into the lungs 2 (two) times daily.   HYDROcodone -acetaminophen  (NORCO/VICODIN) 5-325 MG tablet Take 1 tablet by mouth every 6 (six) hours as needed for moderate pain (pain score 4-6).   loratadine (CLARITIN) 10 MG tablet Take 10 mg by mouth daily as needed for allergies.   methocarbamol  (ROBAXIN ) 500 MG tablet Take 1 tablet (500 mg total) by mouth every 8 (eight) hours as needed.   Multiple Vitamins-Minerals (MULTIVITAMIN WITH MINERALS) tablet Take 1 tablet by mouth daily. Centrum Silver   ondansetron  (ZOFRAN ) 4 MG tablet Take 1 tablet (4 mg total) by mouth every 6 (six) hours as needed for nausea.   polyethylene glycol (MIRALAX  / GLYCOLAX ) 17 g packet Take 17 g by mouth daily as needed.   sacubitril -valsartan  (ENTRESTO ) 24-26 MG TAKE 1 TABLET BY MOUTH TWICE A DAY   spironolactone  (ALDACTONE ) 25 MG tablet Take 1 tablet (25 mg total) by mouth daily.   Turmeric 500 MG CAPS Take 500 mg by mouth daily.    [DISCONTINUED] gabapentin  (NEURONTIN ) 300 MG capsule Take 1 capsule (300 mg total) by mouth 3 (three)  times daily. Give 3 capsules by mouth at bedtime.       01/31/2024    3:16 PM 11/29/2023    3:58 PM 07/06/2023    8:30 AM 06/24/2022    8:12 AM  GAD 7 : Generalized Anxiety Score  Nervous, Anxious, on Edge 0 0 0 0  Control/stop worrying 0 0 0 0  Worry too much - different things  0 0 0 0  Trouble relaxing 0 0 0 0  Restless 0 0 0 0  Easily annoyed or irritable 0 0 0 0  Afraid - awful might happen 0 0 0 0  Total GAD 7 Score 0 0 0 0  Anxiety Difficulty Not difficult at all Not difficult at all Not difficult at all Not difficult at all       01/31/2024    3:16 PM 11/29/2023    3:58 PM 11/16/2023    2:53 PM  Depression screen PHQ 2/9  Decreased Interest 0 0 0  Down, Depressed, Hopeless 0 0 0  PHQ - 2 Score 0 0 0  Altered sleeping 0 1   Tired, decreased energy 0 1   Change in appetite 0 0   Feeling bad or failure about yourself  0 0   Trouble concentrating 0 0   Moving slowly or fidgety/restless 0 0   Suicidal thoughts 0 0   PHQ-9 Score 0 2   Difficult doing work/chores Not difficult at all Not difficult at all     BP Readings from Last 3 Encounters:  01/31/24 128/84  01/02/24 114/74  12/09/23 (!) 120/95    Physical Exam Constitutional:      Appearance: Normal appearance.  Cardiovascular:     Rate and Rhythm: Normal rate and regular rhythm.     Heart sounds: No murmur heard. Pulmonary:     Effort: Pulmonary effort is normal.     Breath sounds: No wheezing.  Musculoskeletal:     Cervical back: Normal range of motion.     Lumbar back: Bony tenderness present. Negative right straight leg raise test and negative left straight leg raise test.     Right hip: No tenderness or bony tenderness. Normal range of motion.     Right lower leg: No edema.     Left lower leg: No edema.  Lymphadenopathy:     Cervical: No cervical adenopathy.  Neurological:     Mental Status: She is alert.    DEXA 01/02/24 at [facility]:       HIP L.FEM. NECK 01-02-24 0.506 -3.1      L.TOTAL HIP 01-02-24 0.694  -2.0      FOREARM  L/R 33% 01-02-24 0.673 -0.3     Wt Readings from Last 3 Encounters:  01/31/24 200 lb (90.7 kg)  01/02/24 200 lb (90.7 kg)  12/09/23 200 lb (90.7 kg)    BP 128/84   Pulse 95   Ht 5' 2 (1.575 m)   Wt 200 lb (90.7 kg)   SpO2 98%   BMI 36.58 kg/m   Assessment and Plan:  Problem List Items Addressed This Visit       Unprioritized   Essential hypertension (Chronic)   Blood pressure is well controlled on Entresto , Coreg  and spironolactone . No medication side effects noted. Plan to continue current medications.       Spinal stenosis of lumbar region without neurogenic claudication (Chronic)   Walking with walker; has not driven since hip surgery. I recommend that she not drive due to decreased reflexes and back pain issues.  She lives with her son who is able and willing to take her to all needed appointments.      Relevant Medications   gabapentin  (NEURONTIN ) 300 MG capsule   Osteoporosis - Primary   DEXA at [facility] showed OP of hip. We discussed Fosamax  and how it must be taken.  She is agreeable to starting treatment. Recommend repeat DEXA in  18-24 months.      Relevant Medications   alendronate  (FOSAMAX ) 70 MG tablet    Return in about 4 months (around 06/01/2024) for HTN, osteoporosis - Dr Lemon.    Leita HILARIO Adie, MD [facility] [place]

## 2024-01-31 NOTE — Assessment & Plan Note (Signed)
 Blood pressure is well controlled on Entresto , Coreg  and spironolactone . No medication side effects noted. Plan to continue current medications.

## 2024-01-31 NOTE — Assessment & Plan Note (Signed)
 DEXA at [facility] showed OP of hip. We discussed Fosamax  and how it must be taken.  She is agreeable to starting treatment. Recommend repeat DEXA in 18-24 months.

## 2024-01-31 NOTE — Assessment & Plan Note (Signed)
 Walking with walker; has not driven since hip surgery. I recommend that she not drive due to decreased reflexes and back pain issues.  She lives with her son who is able and willing to take her to all needed appointments.

## 2024-02-07 ENCOUNTER — Telehealth: Payer: Self-pay

## 2024-02-07 ENCOUNTER — Other Ambulatory Visit: Payer: Self-pay | Admitting: Internal Medicine

## 2024-02-07 NOTE — Progress Notes (Unsigned)
 Date:  02/07/2024   Name:  Audrey Martinez   DOB:  10/31/1945   MRN:  [id number]   Chief Complaint: No chief complaint on file.  HPI  Review of Systems   Lab Results  Component Value Date   NA 141 11/14/2023   K 3.5 11/14/2023   CO2 24 11/14/2023   GLUCOSE 101 (H) 11/14/2023   BUN 27 (H) 11/14/2023   CREATININE 0.94 11/14/2023   CALCIUM  9.1 11/14/2023   EGFR 70 10/16/2023   GFRNONAA >60 11/14/2023   Lab Results  Component Value Date   CHOL 175 06/26/2023   HDL 59 06/26/2023   LDLCALC 96 06/26/2023   TRIG 99 06/26/2023   CHOLHDL 3.0 06/26/2023   Lab Results  Component Value Date   TSH 2.630 06/24/2022   Lab Results  Component Value Date   HGBA1C 5.1 06/25/2023   Lab Results  Component Value Date   WBC 6.4 11/14/2023   HGB 12.6 11/14/2023   HCT 38.2 11/14/2023   MCV 93.4 11/14/2023   PLT 177 11/14/2023   Lab Results  Component Value Date   ALT 14 11/13/2023   AST 19 11/13/2023   ALKPHOS 69 11/13/2023   BILITOT 1.1 11/13/2023   Lab Results  Component Value Date   VD25OH 34.76 11/25/2019     Patient Active Problem List   Diagnosis Date Noted   UTI (urinary tract infection) 11/13/2023   Obesity, morbid (HCC) 10/16/2023   Headache 10/11/2023   Fever 10/11/2023   S/P total right hip arthroplasty 10/09/2023   Problem related to living alone 08/08/2023   TIA (transient ischemic attack) 06/25/2023   CAD (coronary artery disease) 06/25/2023   Chronic kidney disease, stage 3a (HCC) 06/25/2023   Age-related cognitive decline 06/15/2023   Encounter for long-term (current) use of non-steroidal anti-inflammatories 06/15/2023   Personal history of PE (pulmonary embolism) 06/15/2023   Cause of injury, accidental fall, subsequent encounter 06/15/2023   Problem related to health literacy 06/15/2023   Personal history of fall 06/15/2023   Osteoarthritis of right hip joint due to dysplasia 03/14/2023   Osteoarthritis of right hip 03/14/2023    Constipation 12/16/2022   HFrEF (heart failure with reduced ejection fraction) (HCC) 06/24/2022   Melanoma in situ of neck (HCC) 06/22/2021   Trochanteric bursitis of right hip 05/18/2021   Hx of colonic polyps    Atrial flutter (HCC) 01/31/2020   Obesity (BMI 30-39.9) 01/31/2020   Left lower lobe pulmonary nodule 01/29/2020   Dilated cardiomyopathy (HCC) 05/02/2019   Amaurosis fugax of right eye 01/13/2019   Lumbar radiculopathy 05/18/2017   Lumbar degenerative disc disease 05/18/2017   Spinal stenosis of lumbar region without neurogenic claudication 05/18/2017   Atrial tachycardia (HCC) 04/14/2017   SVT (supraventricular tachycardia) (HCC) 04/14/2017   Essential hypertension 04/14/2017   Intermittent asthma without complication 04/14/2017   Osteoporosis 03/21/2017   Benign essential tremor 03/21/2017   Closed fracture of proximal end of left fibula 03/10/2015   Pain of right hip joint 12/10/2013   History of arthroscopy of shoulder 09/14/2011   Hx of atrioventricular node ablation 02/01/2008   Controlled mild persistent asthma 06/04/2004   Mixed hyperlipidemia 05/12/2003    Allergies  Allergen Reactions   Amoxicillin Hives, Shortness Of Breath and Swelling    Has tolerated a 1st generation cephalosporin (CEFAZOLIN) in the past without documented ADRs.    Aspirin Anaphylaxis and Shortness Of Breath   Bacitracin-Polymyx-Neo-Hc [Bacitra-Neomycin-Polymyxin-Hc] Rash   Ciprofloxacin Rash   Ciprofloxacin-Dexamethasone  Anaphylaxis  Crestor  [Rosuvastatin ] Other (See Comments)    Caused problems with memory.   Erythromycin Hives and Rash   Hydrochlorothiazide Other (See Comments) and Palpitations    Raises her blood pressure.    Ibuprofen Other (See Comments)    Sharp stomach pains   Morphine  And Codeine Nausea And Vomiting   Morpholine Salicylate Nausea Only   Omeprazole Palpitations and Other (See Comments)    Tachycardia and same sensation as her SVTs per patient    Doxycycline Hyclate Hives   Keflex  [Cephalexin ] Hives    Has tolerated a 1st generation cephalosporin (CEFAZOLIN) in the past without documented ADRs.     Lovastatin Other (See Comments)    Unable to function   Simvastatin Other (See Comments)    Unable to function   Bacitracin    Dexamethasone     Silicone Dermatitis and Hives   Neomycin-Bacitracin Zn-Polymyx Hives and Rash   Tape Hives and Rash    Past Surgical History:  Procedure Laterality Date   APPENDECTOMY     BREAST CYST ASPIRATION Left 1977   CARDIAC CATHETERIZATION  2009   [facility] [place] [facility]   CARDIAC ELECTROPHYSIOLOGY STUDY AND ABLATION  2009   CATARACT EXTRACTION, BILATERAL  2010   COLONOSCOPY WITH PROPOFOL  N/A 03/21/2018   Procedure: COLONOSCOPY WITH Biopsy;  Surgeon: Unk Corinn Skiff, MD;  Location: [facility];  Service: Endoscopy;  Laterality: N/A;   COLONOSCOPY WITH PROPOFOL  N/A 05/03/2021   Procedure: COLONOSCOPY WITH PROPOFOL ;  Surgeon: Unk Corinn Skiff, MD;  Location[facility] END[facility];  Service: Gastroenterology;  Laterality: N/A;   CORONARY ANGIOPLASTY     DARRACH'S PROCEDURE Bilateral 1977   ESOPHAGOGASTRODUODENOSCOPY  2013   gastritis; done for complaint of dysphagia   ESOPHAGOGASTRODUODENOSCOPY (EGD) WITH PROPOFOL  N/A 03/21/2018   Procedure: ESOPHAGOGASTRODUODENOSCOPY (EGD) WITH Biopsies;  Surgeon: Unk Corinn Skiff, MD;  Location: [facility];  Service: Endoscopy;  Laterality: N/A;  KEEP THIS PATIENT FIRST   JOINT REPLACEMENT     LEFT HEART CATH AND CORONARY ANGIOGRAPHY Left 05/02/2019   Procedure: LEFT HEART CATH AND CORONARY ANGIOGRAPHY;  Surgeon: Perla Evalene PARAS, MD;  Location: [facility] INVASIVE CV LAB;  Service: Cardiovascular;  Laterality: Left;   LUMBAR LAMINECTOMY  2017   Procedure: LUMBAR LAMINECTOMY (L2-L4)   LYMPH NODE REMOVAL (NECK)     MOHS SURGERY  07/2016   BCCA nose   POLYPECTOMY N/A 03/21/2018   Procedure: POLYPECTOMY;  Surgeon: Unk Corinn Skiff, MD;  Location: [facility];  Service: Endoscopy;  Laterality: N/A;   REPLACEMENT TOTAL KNEE Right 2013   TONSILLECTOMY     TOTAL HIP ARTHROPLASTY Right 10/09/2023   Procedure: ARTHROPLASTY, HIP, TOTAL, ANTERIOR APPROACH;  Surgeon: Lorelle Hussar, MD;  Location: [facility] ORS;  Service: Orthopedics;  Laterality: Right;   TOTAL SHOULDER REPLACEMENT Right 2012    Social History   Tobacco Use   Smoking status: Never   Smokeless tobacco: Never  Vaping Use   Vaping status: Never Used  Substance Use Topics   Alcohol use: Not Currently   Drug use: No     Medication list has been reviewed and updated.  No outpatient medications have been marked as taking for the 02/07/24 encounter (Orders Only) with [name] DEL, MD.       01/31/2024    3:16 PM 11/29/2023    3:58 PM 07/06/2023    8:30 AM 06/24/2022    8:12 AM  GAD 7 : Generalized Anxiety Score  Nervous, Anxious, on Edge 0 0 0  0  Control/stop worrying 0 0 0 0  Worry too much - different things 0 0 0 0  Trouble relaxing 0 0 0 0  Restless 0 0 0 0  Easily annoyed or irritable 0 0 0 0  Afraid - awful might happen 0 0 0 0  Total GAD 7 Score 0 0 0 0  Anxiety Difficulty Not difficult at all Not difficult at all Not difficult at all Not difficult at all       01/31/2024    3:16 PM 11/29/2023    3:58 PM 11/16/2023    2:53 PM  Depression screen PHQ 2/9  Decreased Interest 0 0 0  Down, Depressed, Hopeless 0 0 0  PHQ - 2 Score 0 0 0  Altered sleeping 0 1   Tired, decreased energy 0 1   Change in appetite 0 0   Feeling bad or failure about yourself  0 0   Trouble concentrating 0 0   Moving slowly or fidgety/restless 0 0   Suicidal thoughts 0 0   PHQ-9 Score 0 2   Difficult doing work/chores Not difficult at all Not difficult at all     BP Readings from Last 3 Encounters:  01/31/24 128/84  01/02/24 114/74  12/09/23 (!) 120/95    Physical Exam  Wt Readings from Last 3 Encounters:  01/31/24 200 lb (90.7 kg)   01/02/24 200 lb (90.7 kg)  12/09/23 200 lb (90.7 kg)    There were no vitals taken for this visit.  Assessment and Plan:  Problem List Items Addressed This Visit   None   No follow-ups on file.    Leita HILARIO Adie, MD [facility] Health Primary Care and Sports Medicine [place]

## 2024-02-07 NOTE — Telephone Encounter (Signed)
 Copied from CRM [id number]. Topic: Clinical - Medication Question >> Feb 07, 2024  1:22 PM Yolanda T wrote: Reason for CRM: patients son Richardean called to see if it was safe for patient to have dental work done or if she should have pre-medicated before her appt. Per CAL unable to get a provider to answer the question but they will contact dentist asap/patient is presently at the dental office [facility] [phone]. Dental hygienist stated they like to have patients take an antibiotic to prevent any infections after a hip replacement within .

## 2024-02-08 DIAGNOSIS — J45909 Unspecified asthma, uncomplicated: Secondary | ICD-10-CM | POA: Diagnosis not present

## 2024-02-08 DIAGNOSIS — I471 Supraventricular tachycardia, unspecified: Secondary | ICD-10-CM | POA: Diagnosis not present

## 2024-02-08 DIAGNOSIS — G629 Polyneuropathy, unspecified: Secondary | ICD-10-CM | POA: Diagnosis not present

## 2024-02-08 DIAGNOSIS — K219 Gastro-esophageal reflux disease without esophagitis: Secondary | ICD-10-CM | POA: Diagnosis not present

## 2024-02-08 DIAGNOSIS — M1611 Unilateral primary osteoarthritis, right hip: Secondary | ICD-10-CM | POA: Diagnosis not present

## 2024-02-08 DIAGNOSIS — E7800 Pure hypercholesterolemia, unspecified: Secondary | ICD-10-CM | POA: Diagnosis not present

## 2024-02-08 NOTE — Telephone Encounter (Signed)
 Spoke with Crystal at dental office. She needs this to be directed to her surgeon. She is calling patients son back and inform him he needs to call surgeons office.   JM

## 2024-02-08 NOTE — Telephone Encounter (Signed)
 Patient son Audrey Martinez informed abx no longer recommended before dental procedure unless pt has high risk cardiac conditions.  He verbalized understanding.  CM

## 2024-02-14 DIAGNOSIS — E7800 Pure hypercholesterolemia, unspecified: Secondary | ICD-10-CM | POA: Diagnosis not present

## 2024-02-14 DIAGNOSIS — M1611 Unilateral primary osteoarthritis, right hip: Secondary | ICD-10-CM | POA: Diagnosis not present

## 2024-02-14 DIAGNOSIS — I471 Supraventricular tachycardia, unspecified: Secondary | ICD-10-CM | POA: Diagnosis not present

## 2024-02-14 DIAGNOSIS — J45909 Unspecified asthma, uncomplicated: Secondary | ICD-10-CM | POA: Diagnosis not present

## 2024-02-14 DIAGNOSIS — K219 Gastro-esophageal reflux disease without esophagitis: Secondary | ICD-10-CM | POA: Diagnosis not present

## 2024-02-14 DIAGNOSIS — G629 Polyneuropathy, unspecified: Secondary | ICD-10-CM | POA: Diagnosis not present

## 2024-02-19 ENCOUNTER — Telehealth: Payer: Self-pay

## 2024-02-23 DIAGNOSIS — E7800 Pure hypercholesterolemia, unspecified: Secondary | ICD-10-CM | POA: Diagnosis not present

## 2024-02-23 DIAGNOSIS — I471 Supraventricular tachycardia, unspecified: Secondary | ICD-10-CM | POA: Diagnosis not present

## 2024-02-23 DIAGNOSIS — G629 Polyneuropathy, unspecified: Secondary | ICD-10-CM | POA: Diagnosis not present

## 2024-02-23 DIAGNOSIS — K219 Gastro-esophageal reflux disease without esophagitis: Secondary | ICD-10-CM | POA: Diagnosis not present

## 2024-02-23 DIAGNOSIS — M1611 Unilateral primary osteoarthritis, right hip: Secondary | ICD-10-CM | POA: Diagnosis not present

## 2024-02-23 DIAGNOSIS — J45909 Unspecified asthma, uncomplicated: Secondary | ICD-10-CM | POA: Diagnosis not present

## 2024-02-24 ENCOUNTER — Other Ambulatory Visit: Payer: Self-pay | Admitting: Cardiovascular Disease

## 2024-02-24 DIAGNOSIS — I5020 Unspecified systolic (congestive) heart failure: Secondary | ICD-10-CM

## 2024-02-26 ENCOUNTER — Telehealth: Payer: Self-pay

## 2024-02-27 DIAGNOSIS — K219 Gastro-esophageal reflux disease without esophagitis: Secondary | ICD-10-CM | POA: Diagnosis not present

## 2024-02-27 DIAGNOSIS — G629 Polyneuropathy, unspecified: Secondary | ICD-10-CM | POA: Diagnosis not present

## 2024-02-27 DIAGNOSIS — Z96651 Presence of right artificial knee joint: Secondary | ICD-10-CM | POA: Diagnosis not present

## 2024-02-27 DIAGNOSIS — J45909 Unspecified asthma, uncomplicated: Secondary | ICD-10-CM | POA: Diagnosis not present

## 2024-02-27 DIAGNOSIS — I471 Supraventricular tachycardia, unspecified: Secondary | ICD-10-CM | POA: Diagnosis not present

## 2024-02-27 DIAGNOSIS — Z9089 Acquired absence of other organs: Secondary | ICD-10-CM | POA: Diagnosis not present

## 2024-02-27 DIAGNOSIS — E7800 Pure hypercholesterolemia, unspecified: Secondary | ICD-10-CM | POA: Diagnosis not present

## 2024-02-27 DIAGNOSIS — M1611 Unilateral primary osteoarthritis, right hip: Secondary | ICD-10-CM | POA: Diagnosis not present

## 2024-02-28 ENCOUNTER — Other Ambulatory Visit: Payer: Self-pay | Admitting: Adult Health

## 2024-02-28 DIAGNOSIS — M48061 Spinal stenosis, lumbar region without neurogenic claudication: Secondary | ICD-10-CM

## 2024-02-28 DIAGNOSIS — G629 Polyneuropathy, unspecified: Secondary | ICD-10-CM | POA: Diagnosis not present

## 2024-02-28 DIAGNOSIS — J45909 Unspecified asthma, uncomplicated: Secondary | ICD-10-CM | POA: Diagnosis not present

## 2024-02-28 DIAGNOSIS — M1611 Unilateral primary osteoarthritis, right hip: Secondary | ICD-10-CM | POA: Diagnosis not present

## 2024-02-28 DIAGNOSIS — K219 Gastro-esophageal reflux disease without esophagitis: Secondary | ICD-10-CM | POA: Diagnosis not present

## 2024-02-28 DIAGNOSIS — I471 Supraventricular tachycardia, unspecified: Secondary | ICD-10-CM | POA: Diagnosis not present

## 2024-02-28 DIAGNOSIS — F02A0 Dementia in other diseases classified elsewhere, mild, without behavioral disturbance, psychotic disturbance, mood disturbance, and anxiety: Secondary | ICD-10-CM | POA: Diagnosis not present

## 2024-02-28 DIAGNOSIS — E7800 Pure hypercholesterolemia, unspecified: Secondary | ICD-10-CM | POA: Diagnosis not present

## 2024-02-29 ENCOUNTER — Other Ambulatory Visit: Payer: Self-pay | Admitting: Internal Medicine

## 2024-02-29 DIAGNOSIS — I5020 Unspecified systolic (congestive) heart failure: Secondary | ICD-10-CM

## 2024-02-29 DIAGNOSIS — M48061 Spinal stenosis, lumbar region without neurogenic claudication: Secondary | ICD-10-CM

## 2024-02-29 NOTE — Telephone Encounter (Signed)
 Copied from CRM [id number]. Topic: Clinical - Medication Refill >> Feb 29, 2024 12:14 PM Rosaria E wrote: Medication:  gabapentin  (NEURONTIN ) 300 MG capsule  fluticasone  (FLOVENT  HFA) 110 MCG/ACT inhaler  sacubitril -valsartan  (ENTRESTO ) 24-26 MG  Has the patient contacted their pharmacy? Yes (Agent: If no, request that the patient contact the pharmacy for the refill. If patient does not wish to contact the pharmacy document the reason why and proceed with request.) (Agent: If yes, when and what did the pharmacy advise?)  This is the patient's preferred pharmacy:  [facility] [phone] [address], [place] - [address] [place] [address] Phone: [phone] Fax: [phone]  Is this the correct pharmacy for this prescription? Yes If no, delete pharmacy and type the correct one.   Has the prescription been filled recently? Yes  Is the patient out of the medication? Yes  Has the patient been seen for an appointment in the last year OR does the patient have an upcoming appointment? Yes  Can we respond through MyChart? Yes  Agent: Please be advised that Rx refills may take up to 3 business days. We ask that you follow-up with your pharmacy.

## 2024-03-01 ENCOUNTER — Other Ambulatory Visit: Payer: Self-pay | Admitting: Internal Medicine

## 2024-03-01 DIAGNOSIS — I5020 Unspecified systolic (congestive) heart failure: Secondary | ICD-10-CM

## 2024-03-01 MED ORDER — SACUBITRIL-VALSARTAN 24-26 MG PO TABS
ORAL_TABLET | ORAL | 0 refills | Status: AC
Start: 2024-03-01 — End: ?

## 2024-03-01 MED ORDER — FLUTICASONE PROPIONATE HFA 110 MCG/ACT IN AERO: 1.0000 | INHALATION_SPRAY | Freq: Two times a day (BID) | RESPIRATORY_TRACT | 3 refills | Status: DC

## 2024-03-01 NOTE — Telephone Encounter (Signed)
 Requested Prescriptions  Pending Prescriptions Disp Refills   fluticasone  (FLOVENT  HFA) 110 MCG/ACT inhaler 12 g 3    Sig: Inhale 1 puff into the lungs 2 (two) times daily.     Not Delegated - Pulmonology:  Corticosteroids 2 Failed - 03/01/2024  2:10 PM      Failed - This refill cannot be delegated      Passed - Valid encounter within last 12 months    Recent Outpatient Visits           1 month ago Age-related osteoporosis with current pathological fracture with routine healing, subsequent encounter   [facility] Primary Care & Sports Medicine at [facility], Leita DEL, MD   3 months ago Acute midline low back pain without sciatica   [place] Primary Care & Sports Medicine at [facility], Leita DEL, MD   3 months ago Laceration of scalp without foreign body, subsequent encounter   [place] Primary Care & Sports Medicine at [facility], Leita DEL, MD   7 months ago Annual physical exam   [facility] Primary Care & Sports Medicine at [facility], Leita DEL, MD       Future Appointments             In 1 week [name] DEL, MD [facility] Primary Care & Sports Medicine at [facility], [phone] [place]             sacubitril -valsartan  (ENTRESTO ) 24-26 MG 60 tablet 0    Sig: TAKE 1 TABLET BY MOUTH TWICE A DAY     Off-Protocol Failed - 03/01/2024  2:10 PM      Failed - Medication not assigned to a protocol, review manually.      Passed - Valid encounter within last 12 months    Recent Outpatient Visits           1 month ago Age-related osteoporosis with current pathological fracture with routine healing, subsequent encounter   [facility] Primary Care & Sports Medicine at [facility], Leita DEL, MD   3 months ago Acute midline low back pain without sciatica   [place] Primary Care & Sports Medicine at [facility], Leita DEL, MD   3 months ago Laceration of scalp without foreign body,  subsequent encounter   [facility] Health Primary Care & Sports Medicine at [facility], Leita DEL, MD   7 months ago Annual physical exam   [facility] Primary Care & Sports Medicine at [place], Leita DEL, MD       Future Appointments             In 1 week [name] DEL, MD [facility] Health Primary Care & Sports Medicine at [facility], [phone] [place]            Refused Prescriptions Disp Refills   gabapentin  (NEURONTIN ) 300 MG capsule 120 capsule 5    Sig: One capsule in the morning and 3 capsules by mouth at bedtime.     Neurology: Anticonvulsants - gabapentin  Passed - 03/01/2024  2:10 PM      Passed - Cr in normal range and within 360 days    Creatinine, Ser  Date Value Ref Range Status  11/14/2023 0.94 0.44 - 1.00 mg/dL Final         Passed - Completed PHQ-2 or PHQ-9 in the last 360 days      Passed - Valid encounter within last 12 months  Recent Outpatient Visits           1 month ago Age-related osteoporosis with current pathological fracture with routine healing, subsequent encounter   [facility] Health Primary Care & Sports Medicine at [facility], Leita DEL, MD   3 months ago Acute midline low back pain without sciatica   [place] Primary Care & Sports Medicine at [facility], Leita DEL, MD   3 months ago Laceration of scalp without foreign body, subsequent encounter   [facility] Health Primary Care & Sports Medicine at [facility], Leita DEL, MD   7 months ago Annual physical exam   [facility] Primary Care & Sports Medicine at [facility], Leita DEL, MD       Future Appointments             In 1 week Justus, Leita DEL, MD [facility] Health Primary Care & Sports Medicine at [facility], [phone] [place]

## 2024-03-01 NOTE — Telephone Encounter (Signed)
 Requested medications are due for refill today.  Flovent  is not, Entresto  is due  Requested medications are on the active medications list.  yes  Last refill. Flovent  refilled 01/17/2024, Entrestro refilled  #60 11/03/2023  Future visit scheduled.   yes  Notes to clinic.  Refill not delegated.     Requested Prescriptions  Pending Prescriptions Disp Refills   fluticasone  (FLOVENT  HFA) 110 MCG/ACT inhaler 12 g 3    Sig: Inhale 1 puff into the lungs 2 (two) times daily.     Not Delegated - Pulmonology:  Corticosteroids 2 Failed - 03/01/2024  2:19 PM      Failed - This refill cannot be delegated      Passed - Valid encounter within last 12 months    Recent Outpatient Visits           1 month ago Age-related osteoporosis with current pathological fracture with routine healing, subsequent encounter   [facility] Health Primary Care & Sports Medicine at [facility], Leita DEL, MD   3 months ago Acute midline low back pain without sciatica   [place] Primary Care & Sports Medicine at [facility], Leita DEL, MD   3 months ago Laceration of scalp without foreign body, subsequent encounter   [place] Primary Care & Sports Medicine at [facility], Leita DEL, MD   7 months ago Annual physical exam   [facility] Primary Care & Sports Medicine at [facility], Leita DEL, MD       Future Appointments             In 1 week [name] DEL, MD [facility] Health Primary Care & Sports Medicine at [facility], [phone] [place]             sacubitril -valsartan  (ENTRESTO ) 24-26 MG 60 tablet 0    Sig: TAKE 1 TABLET BY MOUTH TWICE A DAY     Off-Protocol Failed - 03/01/2024  2:19 PM      Failed - Medication not assigned to a protocol, review manually.      Passed - Valid encounter within last 12 months    Recent Outpatient Visits           1 month ago Age-related osteoporosis with current pathological fracture with routine healing, subsequent  encounter   [facility] Health Primary Care & Sports Medicine at [facility], Leita DEL, MD   3 months ago Acute midline low back pain without sciatica   [place] Primary Care & Sports Medicine at [facility], Leita DEL, MD   3 months ago Laceration of scalp without foreign body, subsequent encounter   [facility] Health Primary Care & Sports Medicine at [facility], Leita DEL, MD   7 months ago Annual physical exam   [facility] Health Primary Care & Sports Medicine at [facility], Leita DEL, MD       Future Appointments             In 1 week [name] DEL, MD [facility] Health Primary Care & Sports Medicine at [facility], [phone] [place]            Refused Prescriptions Disp Refills   gabapentin  (NEURONTIN ) 300 MG capsule 120 capsule 5    Sig: One capsule in the morning and 3 capsules by mouth at bedtime.     Neurology: Anticonvulsants - gabapentin  Passed - 03/01/2024  2:19 PM      Passed - Cr in normal range and within  360 days    Creatinine, Ser  Date Value Ref Range Status  11/14/2023 0.94 0.44 - 1.00 mg/dL Final         Passed - Completed PHQ-2 or PHQ-9 in the last 360 days      Passed - Valid encounter within last 12 months    Recent Outpatient Visits           1 month ago Age-related osteoporosis with current pathological fracture with routine healing, subsequent encounter   [facility] Primary Care & Sports Medicine at [facility], Leita DEL, MD   3 months ago Acute midline low back pain without sciatica   [place] Primary Care & Sports Medicine at [facility], Leita DEL, MD   3 months ago Laceration of scalp without foreign body, subsequent encounter   [facility] Health Primary Care & Sports Medicine at [facility], Leita DEL, MD   7 months ago Annual physical exam   [facility] Health Primary Care & Sports Medicine at [facility], Leita DEL, MD       Future Appointments             In 1  week Justus, Leita DEL, MD [facility] Health Primary Care & Sports Medicine at [facility], [phone] [place]

## 2024-03-02 ENCOUNTER — Other Ambulatory Visit: Payer: Self-pay | Admitting: Internal Medicine

## 2024-03-02 DIAGNOSIS — M48061 Spinal stenosis, lumbar region without neurogenic claudication: Secondary | ICD-10-CM

## 2024-03-04 NOTE — Telephone Encounter (Signed)
 Rx 01/31/24 #120 5RF- too soon Requested Prescriptions  Pending Prescriptions Disp Refills   gabapentin  (NEURONTIN ) 300 MG capsule [Pharmacy Med Name: GABAPENTIN  300 MG CAPSULE] 90 capsule     Sig: TAKE 1 CAPSULE (300 MG TOTAL) BY MOUTH 3 (THREE) TIMES DAILY. GIVE 3 CAPSULES BY MOUTH AT BEDTIME.     Neurology: Anticonvulsants - gabapentin  Passed - 03/04/2024  4:06 PM      Passed - Cr in normal range and within 360 days    Creatinine, Ser  Date Value Ref Range Status  11/14/2023 0.94 0.44 - 1.00 mg/dL Final         Passed - Completed PHQ-2 or PHQ-9 in the last 360 days      Passed - Valid encounter within last 12 months    Recent Outpatient Visits           1 month ago Age-related osteoporosis with current pathological fracture with routine healing, subsequent encounter   [facility] Health Primary Care & Sports Medicine at [facility], Leita DEL, MD   3 months ago Acute midline low back pain without sciatica   [place] Primary Care & Sports Medicine at [facility], Leita DEL, MD   3 months ago Laceration of scalp without foreign body, subsequent encounter   [facility] Health Primary Care & Sports Medicine at [facility], Leita DEL, MD   8 months ago Annual physical exam   [facility] Health Primary Care & Sports Medicine at [facility], Leita DEL, MD       Future Appointments             In 1 week Justus, Leita DEL, MD [facility] Health Primary Care & Sports Medicine at [facility], [phone] [place]

## 2024-03-04 NOTE — Telephone Encounter (Signed)
 Requested medication (s) are due for refill today: yes  Requested medication (s) are on the active medication list: yes  Last refill:  03/01/24  Future visit scheduled: yes  Notes to clinic:  Pharmacy comment: Alternative Requested:THE PRESCRIBED MEDICATION IS NOT COVERED BY INSURANCE. PLEASE CONSIDER CHANGING TO ONE OF THE SUGGESTED COVERED ALTERNATIVES.      Requested Prescriptions  Pending Prescriptions Disp Refills   Fluticasone  Furoate Ellipta 100 MCG/ACT AEPB [Pharmacy Med Name: FLUTICASONE  ELLIPTA INH]  0     Pulmonology:  Corticosteroids Passed - 03/04/2024 11:44 AM      Passed - Valid encounter within last 12 months    Recent Outpatient Visits           1 month ago Age-related osteoporosis with current pathological fracture with routine healing, subsequent encounter   [place] Primary Care & Sports Medicine at [facility], Leita DEL, MD   3 months ago Acute midline low back pain without sciatica   [place] Primary Care & Sports Medicine at [facility], Leita DEL, MD   3 months ago Laceration of scalp without foreign body, subsequent encounter   [facility] Primary Care & Sports Medicine at [facility], Leita DEL, MD   8 months ago Annual physical exam   [facility] Health Primary Care & Sports Medicine at [facility], Leita DEL, MD       Future Appointments             In 1 week Justus, Leita DEL, MD [facility] Health Primary Care & Sports Medicine at [facility], [phone] [place]

## 2024-03-05 ENCOUNTER — Telehealth: Payer: Self-pay

## 2024-03-11 ENCOUNTER — Ambulatory Visit: Admitting: Internal Medicine

## 2024-03-12 DIAGNOSIS — J45909 Unspecified asthma, uncomplicated: Secondary | ICD-10-CM | POA: Diagnosis not present

## 2024-03-12 DIAGNOSIS — G629 Polyneuropathy, unspecified: Secondary | ICD-10-CM | POA: Diagnosis not present

## 2024-03-12 DIAGNOSIS — K219 Gastro-esophageal reflux disease without esophagitis: Secondary | ICD-10-CM | POA: Diagnosis not present

## 2024-03-12 DIAGNOSIS — M1611 Unilateral primary osteoarthritis, right hip: Secondary | ICD-10-CM | POA: Diagnosis not present

## 2024-03-12 DIAGNOSIS — E7800 Pure hypercholesterolemia, unspecified: Secondary | ICD-10-CM | POA: Diagnosis not present

## 2024-03-12 DIAGNOSIS — I471 Supraventricular tachycardia, unspecified: Secondary | ICD-10-CM | POA: Diagnosis not present

## 2024-03-25 DIAGNOSIS — G629 Polyneuropathy, unspecified: Secondary | ICD-10-CM | POA: Diagnosis not present

## 2024-03-25 DIAGNOSIS — E7800 Pure hypercholesterolemia, unspecified: Secondary | ICD-10-CM | POA: Diagnosis not present

## 2024-03-25 DIAGNOSIS — J45909 Unspecified asthma, uncomplicated: Secondary | ICD-10-CM | POA: Diagnosis not present

## 2024-03-25 DIAGNOSIS — I471 Supraventricular tachycardia, unspecified: Secondary | ICD-10-CM | POA: Diagnosis not present

## 2024-03-25 DIAGNOSIS — K219 Gastro-esophageal reflux disease without esophagitis: Secondary | ICD-10-CM | POA: Diagnosis not present

## 2024-03-25 DIAGNOSIS — M1611 Unilateral primary osteoarthritis, right hip: Secondary | ICD-10-CM | POA: Diagnosis not present

## 2024-03-28 DIAGNOSIS — Z96651 Presence of right artificial knee joint: Secondary | ICD-10-CM | POA: Diagnosis not present

## 2024-03-28 DIAGNOSIS — M1611 Unilateral primary osteoarthritis, right hip: Secondary | ICD-10-CM | POA: Diagnosis not present

## 2024-03-28 DIAGNOSIS — E7800 Pure hypercholesterolemia, unspecified: Secondary | ICD-10-CM | POA: Diagnosis not present

## 2024-03-28 DIAGNOSIS — J45909 Unspecified asthma, uncomplicated: Secondary | ICD-10-CM | POA: Diagnosis not present

## 2024-03-28 DIAGNOSIS — I471 Supraventricular tachycardia, unspecified: Secondary | ICD-10-CM | POA: Diagnosis not present

## 2024-03-28 DIAGNOSIS — K219 Gastro-esophageal reflux disease without esophagitis: Secondary | ICD-10-CM | POA: Diagnosis not present

## 2024-03-28 DIAGNOSIS — Z9089 Acquired absence of other organs: Secondary | ICD-10-CM | POA: Diagnosis not present

## 2024-03-28 DIAGNOSIS — G629 Polyneuropathy, unspecified: Secondary | ICD-10-CM | POA: Diagnosis not present

## 2024-03-29 ENCOUNTER — Telehealth

## 2024-03-29 NOTE — Patient Instructions (Signed)
 Audrey Martinez - I am sorry I was unable to reach you today for our scheduled appointment. I work with Justus, Leita DEL, MD and am calling to support your healthcare needs. Please contact me at [phone] at your earliest convenience. I look forward to speaking with you soon.   Thank you,  Santana Stamp BSN, CCM [place]  [facility] RN Care Manager Direct Dial: [phone]  Fax: [phone]

## 2024-03-29 NOTE — Patient Outreach (Signed)
 Three separate attempts made to reach patient/son, able to leave voicemails, sent Missed Appointment notification through MyChart, will close CCM.

## 2024-04-02 ENCOUNTER — Other Ambulatory Visit: Payer: Self-pay | Admitting: Adult Health

## 2024-04-02 DIAGNOSIS — I10 Essential (primary) hypertension: Secondary | ICD-10-CM

## 2024-04-03 ENCOUNTER — Other Ambulatory Visit: Payer: Self-pay | Admitting: Internal Medicine

## 2024-04-03 DIAGNOSIS — I10 Essential (primary) hypertension: Secondary | ICD-10-CM

## 2024-04-03 DIAGNOSIS — K219 Gastro-esophageal reflux disease without esophagitis: Secondary | ICD-10-CM | POA: Diagnosis not present

## 2024-04-03 DIAGNOSIS — J45909 Unspecified asthma, uncomplicated: Secondary | ICD-10-CM | POA: Diagnosis not present

## 2024-04-03 DIAGNOSIS — E7800 Pure hypercholesterolemia, unspecified: Secondary | ICD-10-CM | POA: Diagnosis not present

## 2024-04-03 DIAGNOSIS — I471 Supraventricular tachycardia, unspecified: Secondary | ICD-10-CM | POA: Diagnosis not present

## 2024-04-03 DIAGNOSIS — G629 Polyneuropathy, unspecified: Secondary | ICD-10-CM | POA: Diagnosis not present

## 2024-04-03 DIAGNOSIS — M1611 Unilateral primary osteoarthritis, right hip: Secondary | ICD-10-CM | POA: Diagnosis not present

## 2024-04-03 NOTE — Telephone Encounter (Unsigned)
 Copied from CRM [id number]. Topic: Clinical - Medication Refill >> Apr 03, 2024  2:44 PM Everette C wrote: Medication: carvedilol  (COREG ) 6.25 MG tablet [[id number]]  Has the patient contacted their pharmacy? Yes (Agent: If no, request that the patient contact the pharmacy for the refill. If patient does not wish to contact the pharmacy document the reason why and proceed with request.) (Agent: If yes, when and what did the pharmacy advise?)  This is the patient's preferred pharmacy:  [facility] [phone] [address], [place] - [address] [place] [address] Phone: [phone] Fax: [phone]  Is this the correct pharmacy for this prescription? Yes If no, delete pharmacy and type the correct one.   Has the prescription been filled recently? Yes  Is the patient out of the medication? Yes  Has the patient been seen for an appointment in the last year OR does the patient have an upcoming appointment? Yes  Can we respond through MyChart? No  Agent: Please be advised that Rx refills may take up to 3 business days. We ask that you follow-up with your pharmacy.

## 2024-04-04 NOTE — Telephone Encounter (Signed)
 Requested medications are due for refill today.  yes  Requested medications are on the active medications list.  yes  Last refill. 11/20/2023 #60 0 rf  Future visit scheduled.   yes  Notes to clinic.  Rx signed by another provider.    Requested Prescriptions  Pending Prescriptions Disp Refills   carvedilol  (COREG ) 6.25 MG tablet 60 tablet 0    Sig: Take 1 tablet (6.25 mg total) by mouth 2 (two) times daily with a meal. Please call [phone] to schedule an appointment with Dr. Timothy Gollan for future refills. Thank you. 1st attempt.     Cardiovascular: Beta Blockers 3 Passed - 04/04/2024  4:57 PM      Passed - Cr in normal range and within 360 days    Creatinine, Ser  Date Value Ref Range Status  11/14/2023 0.94 0.44 - 1.00 mg/dL Final         Passed - AST in normal range and within 360 days    AST  Date Value Ref Range Status  11/13/2023 19 15 - 41 U/L Final         Passed - ALT in normal range and within 360 days    ALT  Date Value Ref Range Status  11/13/2023 14 0 - 44 U/L Final         Passed - Last BP in normal range    BP Readings from Last 1 Encounters:  01/31/24 128/84         Passed - Last Heart Rate in normal range    Pulse Readings from Last 1 Encounters:  01/31/24 95         Passed - Valid encounter within last 6 months    Recent Outpatient Visits           2 months ago Age-related osteoporosis with current pathological fracture with routine healing, subsequent encounter   [facility], Leita DEL, MD   4 months ago Acute midline low back pain without sciatica   [place] [facility] at [facility], Leita DEL, MD   4 months ago Laceration of scalp without foreign body, subsequent encounter   [facility] at [facility], Leita DEL, MD   9 months ago Annual physical exam   [facility] at  [facility], Leita DEL, MD

## 2024-04-05 ENCOUNTER — Telehealth: Payer: Self-pay | Admitting: Cardiovascular Disease

## 2024-04-05 DIAGNOSIS — I10 Essential (primary) hypertension: Secondary | ICD-10-CM

## 2024-04-05 NOTE — Telephone Encounter (Signed)
*  STAT* If patient is at the pharmacy, call can be transferred to refill team.   1. Which medications need to be refilled? (please list name of each medication and dose if known)   carvedilol  (COREG ) 6.25 MG tablet     2. Would you like to learn more about the convenience, safety, & potential cost savings by using the [facility]? no  3. Are you open to using the [facility] (Type [facility].  ).no   4. Which pharmacy/location (including street and city if local pharmacy) is medication to be sent to? [id number] - [place], [place] - [address]     5. Do they need a 30 day or 90 day supply? 90 day   Pt is low on medication

## 2024-04-08 MED ORDER — CARVEDILOL 6.25 MG PO TABS: 6.2500 mg | ORAL_TABLET | Freq: Two times a day (BID) | ORAL | 0 refills | Status: DC

## 2024-04-08 NOTE — Telephone Encounter (Signed)
 [facility]  Pt aware to call and schedule appt.

## 2024-04-17 DIAGNOSIS — E7800 Pure hypercholesterolemia, unspecified: Secondary | ICD-10-CM | POA: Diagnosis not present

## 2024-04-17 DIAGNOSIS — M1611 Unilateral primary osteoarthritis, right hip: Secondary | ICD-10-CM | POA: Diagnosis not present

## 2024-04-17 DIAGNOSIS — G629 Polyneuropathy, unspecified: Secondary | ICD-10-CM | POA: Diagnosis not present

## 2024-04-17 DIAGNOSIS — I471 Supraventricular tachycardia, unspecified: Secondary | ICD-10-CM | POA: Diagnosis not present

## 2024-04-17 DIAGNOSIS — K219 Gastro-esophageal reflux disease without esophagitis: Secondary | ICD-10-CM | POA: Diagnosis not present

## 2024-04-17 DIAGNOSIS — J45909 Unspecified asthma, uncomplicated: Secondary | ICD-10-CM | POA: Diagnosis not present

## 2024-05-06 ENCOUNTER — Other Ambulatory Visit: Payer: Self-pay | Admitting: Cardiovascular Disease

## 2024-05-06 DIAGNOSIS — I10 Essential (primary) hypertension: Secondary | ICD-10-CM

## 2024-05-07 NOTE — Telephone Encounter (Signed)
Please contact pt for future appointment. 

## 2024-05-08 NOTE — Telephone Encounter (Signed)
 Left message on VM.

## 2024-05-16 NOTE — Telephone Encounter (Signed)
 Scheduled on 06/17/24

## 2024-06-16 NOTE — Progress Notes (Unsigned)
 "  Cardiology Office Note    Date:  06/17/2024   ID:  Audrey, Martinez 09/02/1945, MRN [id number]  PCP:  [name] DEL, MD  Cardiologist:  Evalene Lunger, MD  Electrophysiologist:  OLE ONEIDA HOLTS, MD (Inactive)   Chief Complaint: Follow-up  History of Present Illness:   Audrey Martinez is a 79 y.o. female with history of nonobstructive CAD, atrial tachycardia/SVT/AVNRT status post ablation x2, HFimpEF secondary to NICM, bilateral pulmonary emboli in 01/2020 in the setting of COVID infection s/p Eliquis, remote CVAs noted on prior imaging, TIA, SDH in the setting of mechanical fall in 2022 managed conservatively, HTN, HLD intolerant to statins and Zetia , multiple medication intolerances, and orthostasis who presents for follow-up of nonobstructive CAD and SVT.  Patient previously followed by cardiology in [place] .  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  No sustained atrial tach/no inducible atrial flutter.  She underwent successful radiofrequency ablation of AV nodal slow pathway.  Following this, she had recurrent symptoms shortly thereafter and had an event monitor and repeat EP study on 01/21/2008: Markedly prolonged at baseline AH interval (AV nodal function is subnormal).  No inducible tachycardia; only inducible double echo beats.  As per EP, event monitor with ectopic atrial tachycardia versus atrial flutter with 2-1 block.  Notes indicated she did not want anticoagulation.  Prior echo from 12/13/2006 showed an LVEF of 55 to 60%, no significant valvular abnormalities.  ETT on 09/28/2007 showed Bruce protocol with the patient exercising for 5-minutes and 1 second achieving 7 METs with a maximum heart rate of 179 bpm (111% MPHR).  Pretest EKG was normal with a heart rate of 96 bpm.  No chest pain noted during test.  No significant ischemia was noted.  Stress echo on 11/14/2008 showed Bruce protocol with the patient  exercising for 4 minutes and 31 seconds achieving 4.6 METs and 113% MPHR.  Echocardiogram showed an LVEF of 60% with no significant ischemia.   She established with Dr. Gollan in 2018.  She declined statin, aspirin, Plavix , and anticoagulation.  She was seen in late 2019 with tachypalpitations with outpatient cardiac monitoring showing sinus rhythm with an average heart of 87 bpm, first-degree AV block, type I second-degree AV block, 2 runs of NSVT with the fastest and longest interval lasting 16 beats, isolated PACs, and atrial triplets with rare isolated PVCs, ventricular couplets and triplets, ventricular bigeminy was noted.  Patient triggered events were not associated with significant arrhythmia.  Follow-up echo showed an EF of 45 to 50%, mild diffuse hypokinesis with select images showing possible anteroseptal and apical hypokinesis, mild aortic regurgitation, mild mitral regurgitation, normal size left atrium, normal RV systolic function, normal PASP.  Subsequent Lexiscan  Myoview  in 05/2018 showed no significant ischemia with a moderate size region of predominantly fixed perfusion defect of mild to moderate intensity of the mid to distal anterior and anterior septal wall unable to exclude breast attenuation artifact, EF 41%, overall low to moderate risk scan.  For vision disturbances, she underwent carotid artery ultrasound on 01/11/2019 showing minor carotid artery atherosclerosis with no hemodynamically significant internal carotid artery stenosis and antegrade vertebral artery flow bilaterally.  Zio patch in 01/2019 showed sinus rhythm with 3 episodes of NSVT with the longest episode lasting 7 beats, second-degree AV block type I, and rare atrial/ventricular ectopy.  Repeat Zio patch in 02/2021 showed 1 run of NSVT lasting 5 beats, second-degree AV block type I, and no significant arrhythmia.  Echo in 03/2019 showed an EF of 30 to 35%, grade 2 diastolic dysfunction, normal RV systolic function and  ventricular cavity size, trace mitral regurgitation, trivial tricuspid regurgitation, normal PASP, and aortic dilatation.  Given her cardiomyopathy, she underwent LHC in 04/2019 which showed no obstructive CAD.  Echo from 12/2019 showed an EF of 30 to 35%, global hypokinesis, normal RV systolic function and ventricular cavity size, normal PASP, mild to moderate mitral regurgitation, and an ascending aorta measuring 3.5 cm.  GDMT has previously been interrupted in the setting of orthostatic hypotension.   She was admitted to the hospital in 05/2023 with stroke-like symptoms with CTA of the head and MRI of the brain showing no evidence of acute intracranial abnormality.  CTA of the head and neck showed no hemodynamically significant stenosis of the bilateral arteries.  Echo showed an EF of 50 to 55%, mild LVH, grade 1 diastolic dysfunction, normal RV systolic function and ventricular cavity size, mild mitral regurgitation, mild aortic insufficiency, aortic valve sclerosis, normal CVP, and a negative bubble study.  Neurology recommended the patient be discharged on clopidogrel .  Subsequent Zio patch in 05/2023 showed a predominant rhythm of sinus with an average rate of 93 bpm, 1701 episodes of SVT with the fastest interval lasting 3429 beats with a maximum rate of 171 bpm and the longest interval lasting 4 hours and 49 minutes.  Given Zio patch findings she was evaluated by EP with recommendation to continue carvedilol  and follow-up with EP on an as-needed basis.  She comes in accompanied by her son today and is doing well from a cardiac perspective, without symptoms of angina or cardiac decompensation.  No palpitations, dizziness, presyncope, or syncope.  No significant lower extremity swelling or progressive orthopnea.  No falls.  She has stable two-pillow orthopnea.  Adherent and tolerating cardiac pharmacotherapy including carvedilol , ezetimibe , Entresto , and spironolactone .  Patient's son is now living with her  and assisting with medications due to cognitive decline.  Overall, patient and son feel like she is doing well at this time and do not have any acute cardiac concerns.   Labs independently reviewed: 10/2023 - Hgb 12.6, PLT 177, potassium 3.5, BUN 27, serum creatinine 0.94, albumin 4.0, AST/ALT normal 05/2023 - TC 175, TG 99, HDL 59, LDL 96, Hgb 5.1 05/2022 - TSH normal  Past Medical History:  Diagnosis Date   Actinic keratosis    Acute renal failure (ARF) 01/31/2020   Acute respiratory failure with hypoxia (HCC) 01/29/2020   Amaurosis fugax, right eye 12/2018   Aortic atherosclerosis    Arthritis    Asthma    Atrial tachycardia    a.) s/p RFCA for AVNRT in 2008 at [facility] [place]    Basal cell carcinoma 07/2016   central forehead (tx in [place] )   Bilateral pulmonary embolism (HCC) 01/28/2020   a.) in setting of SARS-CoV-2 infection (12/2019); noted BILATERALLY with (+) associated RIGHT heart strain; Tx'd with rivaroxaban  Xarelto  20 mg daily x 6 months then PRN   Cerebellar infarct (RIGHT) 1998   Coronary artery disease    DDD (degenerative disc disease), cervical    Dyspnea    Facial laceration 06/16/2022   Date of injury: 06/08/2022  [facility] ED history below:  79 year old female presenting to the emergency department after mechanical, nonsyncopal fall causing her to strike her face on the edge/corner of a curio cabinet and sustaining a deep and approximate 8 cm laceration to the right cheek in an irregular pattern to the  angle of the right lower jaw. Wound does not proceed th   GERD (gastroesophageal reflux disease)    Herniated disc, lumbar    HFrEF (heart failure with reduced ejection fraction) (HCC)    History of 2019 novel coronavirus disease (COVID-19) 12/2019   History of bilateral cataract extraction    Hyperlipidemia    Incomplete left bundle branch block (LBBB)    Left wrist fracture    Lumbar stenosis    s/p LEFT L2-4  hemilaminectomies and partial facetectomies, foraminotomies for lateral recess and foraminal decompression of L3 and L4 nerve roots   Melanoma (HCC) 05/12/2020   Melanoma IS R post neck, excised 08/10/20   Mobitz type 1 second degree atrioventricular block    a.) evaluated by EP --> paroxysmal in nature; nocturnal epidoe felt to be vagally mediated   NICM (nonischemic cardiomyopathy) (HCC)    a. 04/2018 Echo: EF 45-50%. Anteroseptal and apical HK in some views. Mild AI/MR. Nl RV fxn; b. 05/2018 MV: mid-dist ant and antsept mild defect - ? breast atten. EF 41%. No ischemia; c. 03/2019 Echo: EF 30-35%, Gr2 DD, Trace MR, triv TR. Asc Ao ectatic dil - 38mm.    Osteoporosis    Personal history of transient ischemic attack (TIA), and cerebral infarction without residual deficits 04/22/2003   Pneumonia due to COVID-19 virus 01/26/2020   SVT (supraventricular tachycardia)    a.2008 s/p RFCA for AVNRT; b. 9 & 02/2019 Zio x 2: 1. Avg HR 89, 3 runs NSVT (longest 7 beats), Mobitz 1. 2. Avg HR 89, NSVT x 1 (5 beats), Mobitz 1. No signif arrhythmia.   Tendinitis of right knee 07/03/2017    Past Surgical History:  Procedure Laterality Date   APPENDECTOMY     BREAST CYST ASPIRATION Left 1977   CARDIAC CATHETERIZATION  2009   [facility] [place] [facility]   CARDIAC ELECTROPHYSIOLOGY STUDY AND ABLATION  2009   CATARACT EXTRACTION, BILATERAL  2010   COLONOSCOPY WITH PROPOFOL  N/A 03/21/2018   Procedure: COLONOSCOPY WITH Biopsy;  Surgeon: Unk Corinn Skiff, MD;  Location: [facility];  Service: Endoscopy;  Laterality: N/A;   COLONOSCOPY WITH PROPOFOL  N/A 05/03/2021   Procedure: COLONOSCOPY WITH PROPOFOL ;  Surgeon: Unk Corinn Skiff, MD;  Location: [facility] ENDOSCOPY;  Service: Gastroenterology;  Laterality: N/A;   CORONARY ANGIOPLASTY     DARRACH'S PROCEDURE Bilateral 1977   ESOPHAGOGASTRODUODENOSCOPY  2013   gastritis; done for complaint of dysphagia   ESOPHAGOGASTRODUODENOSCOPY  (EGD) WITH PROPOFOL  N/A 03/21/2018   Procedure: ESOPHAGOGASTRODUODENOSCOPY (EGD) WITH Biopsies;  Surgeon: Unk Corinn Skiff, MD;  Location: [facility];  Service: Endoscopy;  Laterality: N/A;  KEEP THIS PATIENT FIRST   JOINT REPLACEMENT     LEFT HEART CATH AND CORONARY ANGIOGRAPHY Left 05/02/2019   Procedure: LEFT HEART CATH AND CORONARY ANGIOGRAPHY;  Surgeon: Perla Evalene PARAS, MD;  Location: [facility] INVASIVE CV LAB;  Service: Cardiovascular;  Laterality: Left;   LUMBAR LAMINECTOMY  2017   Procedure: LUMBAR LAMINECTOMY (L2-L4)   LYMPH NODE REMOVAL (NECK)     MOHS SURGERY  07/2016   BCCA nose   POLYPECTOMY N/A 03/21/2018   Procedure: POLYPECTOMY;  Surgeon: Unk Corinn Skiff, MD;  Location: [facility];  Service: Endoscopy;  Laterality: N/A;   REPLACEMENT TOTAL KNEE Right 2013   TONSILLECTOMY     TOTAL HIP ARTHROPLASTY Right 10/09/2023   Procedure: ARTHROPLASTY, HIP, TOTAL, ANTERIOR APPROACH;  Surgeon: Lorelle Hussar, MD;  Location: [facility] ORS;  Service: Orthopedics;  Laterality: Right;   TOTAL  SHOULDER REPLACEMENT Right 2012    Current Medications: Active Medications[1]  Allergies:   Amoxicillin, Aspirin, Bacitracin-polymyx-neo-hc [bacitra-neomycin-polymyxin-hc], Ciprofloxacin, Ciprofloxacin-dexamethasone , Crestor  [rosuvastatin ], Erythromycin, Hydrochlorothiazide, Ibuprofen, Morphine  and codeine, Morpholine salicylate, Omeprazole, Doxycycline hyclate, Keflex  [cephalexin ], Lovastatin, Simvastatin, Bacitracin, Dexamethasone , Silicone, Neomycin-bacitracin zn-polymyx, and Tape   Social History   Socioeconomic History   Marital status: Widowed    Spouse name: Not on file   Number of children: 3   Years of education: Not on file   Highest education level: Not on file  Occupational History   Occupation: retired  Tobacco Use   Smoking status: Never   Smokeless tobacco: Never  Vaping Use   Vaping status: Never Used  Substance and Sexual Activity   Alcohol use: Not  Currently   Drug use: No   Sexual activity: Never  Other Topics Concern   Not on file  Social History Narrative   ** Merged History Encounter **       Lives at home alone, has resources if help is needed   Social Drivers of Health   Tobacco Use: Low Risk (06/17/2024)   Patient History    Smoking Tobacco Use: Never    Smokeless Tobacco Use: Never    Passive Exposure: Not on file  Financial Resource Strain: Low Risk  (08/02/2023)   Received from [facility]   Overall Financial Resource Strain (CARDIA)    Difficulty of Paying Living Expenses: Not hard at all  Food Insecurity: No Food Insecurity (11/16/2023)   Epic    Worried About Radiation Protection Practitioner of Food in the Last Year: Never true    Ran Out of Food in the Last Year: Never true  Transportation Needs: No Transportation Needs (11/16/2023)   Epic    Lack of Transportation (Medical): No    Lack of Transportation (Non-Medical): No  Physical Activity: Insufficiently Active (04/24/2023)   Exercise Vital Sign    Days of Exercise per Week: 4 days    Minutes of Exercise per Session: 20 min  Stress: No Stress Concern Present (04/24/2023)   [employer] of Occupational Health - Occupational Stress Questionnaire    Feeling of Stress : Not at all  Social Connections: Moderately Isolated (11/14/2023)   Social Connection and Isolation Panel    Frequency of Communication with Friends and Family: More than three times a week    Frequency of Social Gatherings with Friends and Family: Three times a week    Attends Religious Services: More than 4 times per year    Active Member of Clubs or Organizations: No    Attends Banker Meetings: Never    Marital Status: Widowed  Depression (PHQ2-9): Low Risk (01/31/2024)   Depression (PHQ2-9)    PHQ-2 Score: 0  Alcohol Screen: Low Risk (04/24/2023)   Alcohol Screen    Last Alcohol Screening Score (AUDIT): 0  Housing: Unknown (11/16/2023)   Epic    Unable to Pay for Housing  in the Last Year: No    Number of Times Moved in the Last Year: Not on file    Homeless in the Last Year: No  Utilities: Not At Risk (11/16/2023)   Epic    Threatened with loss of utilities: No  Health Literacy: Adequate Health Literacy (04/24/2023)   B1300 Health Literacy    Frequency of need for help with medical instructions: Never     Family History:  The patient's family history includes CAD (age of onset: 75) in her sister; Clotting disorder in her half-sister and  half-sister; Diabetes in her mother; Stroke in her mother and sister; Stroke (age of onset: 7) in her sister; Valvular heart disease in her father. There is no history of Breast cancer.  ROS:   12-point review of systems is negative unless otherwise noted in the HPI.   EKGs/Labs/Other Studies Reviewed:    Studies reviewed were summarized above. The additional studies were reviewed today:  Zio patch 05/2023: Normal sinus rhythm Patient had a min HR of 30 bpm, max HR of 171 bpm, and avg HR of 93 bpm. Bundle Branch Block/IVCD was present.    1701 Supraventricular Tachycardia/atrial tachycardia runs occurred, the run with the fastest interval lasting 3429 beats with a max rate of 171 bpm, the longest lasting 4 hours 49 mins with an avg rate of 106 bpm.    9 episode(s) of AV Block (2nd and High Grade) occurred, lasting a total of 29 secs in the setting of atrial tachycardia with marked ventricular bradycardia with variable conduction.    Isolated SVEs were rare (<1.0%), SVE Couplets were rare (<1.0%), and SVE Triplets were rare (<1.0%).  Isolated VEs were rare (<1.0%), VE Couplets were rare (<1.0%), and no VE Triplets were present.    No significant patient triggered events recorded __________  2D echo 06/26/2023: 1. Left ventricular ejection fraction, by estimation, is 50 to 55%. The  left ventricle has low normal function. Left ventricular endocardial  border not optimally defined to evaluate regional wall motion.  There is  mild left ventricular hypertrophy. Left  ventricular diastolic parameters are consistent with Grade I diastolic  dysfunction (impaired relaxation).   2. Right ventricular systolic function is normal. The right ventricular  size is normal. Tricuspid regurgitation signal is inadequate for assessing  PA pressure.   3. The mitral valve is normal in structure. Mild mitral valve  regurgitation. No evidence of mitral stenosis.   4. The aortic valve is normal in structure. Aortic valve regurgitation is  mild. Aortic valve sclerosis is present, with no evidence of aortic valve  stenosis.   5. The inferior vena cava is normal in size with greater than 50%  respiratory variability, suggesting right atrial pressure of 3 mmHg.   6. Agitated saline contrast bubble study was negative, with no evidence  of any interatrial shunt.  __________  2D echo 01/09/2020: 1. Left ventricular ejection fraction, by estimation, is 30 to 35%. The  left ventricle has moderately decreased function. The left ventricle  demonstrates global hypokinesis. Left ventricular diastolic parameters are  indeterminate. The average left  ventricular global longitudinal strain is -9.6 %.   2. Right ventricular systolic function is normal. The right ventricular  size is normal. There is normal pulmonary artery systolic pressure.   3. The mitral valve is normal in structure. Mild to moderate mitral valve  regurgitation.   4. The aortic valve was not well visualized. Aortic valve regurgitation  is mild.   5. Ascending aorta 3.5 cm __________   LHC 05/02/2019: Coronary dominance: Right  Left mainstem:   Large vessel that bifurcates into the LAD and left circumflex, no significant disease noted  Left anterior descending (LAD):   Large vessel that extends to the apical region, diagonal branch 2 of moderate size, no significant disease noted  Left circumflex (LCx):  Large vessel with OM branch 2, no significant disease  noted  Right coronary artery (RCA):  Right dominant vessel with PL and PDA, no significant disease noted  Left ventriculography: Left ventricular systolic function is moderately depressed,  LVEF is estimated at 30 to 35%, there is no significant mitral regurgitation , no significant aortic valve stenosis  Final Conclusions:   Nonobstructive CAD, EF estimated at 30 to 35%, global hypokinesis,  LVEDP 17 to 20 mm Hg  Recommendations:  Medical management difficult secondary to numerous medical intolerances, Reports having diarrhea on low dose coreg  Will discuss with her as an outpt, Unable to exclude hypertensive cardiomyopathy, Suggested she monitor BP at home and call our office with numbers She might benefit from lasix PRN __________   2D echo 04/17/2019: 1. Left ventricular ejection fraction, by visual estimation, is 30 to  35%. The left ventricle has moderate to severely decreased function. There  is no left ventricular hypertrophy.   2. Left ventricular diastolic parameters are consistent with Grade II  diastolic dysfunction (pseudonormalization).   3. Global right ventricle has normal systolic function.The right  ventricular size is normal. Right vetricular wall thickness was not  assessed.   4. Left atrial size was normal.   5. Right atrial size was normal.   6. The mitral valve is normal in structure. Trace mitral valve  regurgitation.   7. The tricuspid valve is grossly normal. Tricuspid valve regurgitation  is trivial.   8. The aortic valve is tricuspid. Aortic valve regurgitation is not  visualized.   9. The pulmonic valve was not well visualized. Pulmonic valve  regurgitation is not visualized.  10. Aortic dilatation noted.  11. There is ectatic dilatation of the ascending aorta measuring 38 mm.  12. Normal pulmonary artery systolic pressure. __________   Zio patch 02/2019: Normal sinus rhythm avg HR of 89 bpm.    1 run of Ventricular Tachycardia occurred  lasting 5 beats with a max rate of 174 bpm (avg 133 bpm).    Second Degree AV Block-Mobitz I (Wenckebach) was present. Isolated SVEs were rare (<1.0%), SVE Couplets were rare (<1.0%), and SVE Triplets were rare (<1.0%). Isolated VEs were rare (<1.0%), VE Couplets were rare (<1.0%), and no VE Triplets were present. Ventricular Trigeminy was present.   Patient triggered events were not associated with significant arrhythmia __________   Zio patch 01/2019: Normal sinus rhythm avg HR of 89 bpm.   3 Ventricular Tachycardia runs occurred, the run with the fastest interval lasting 5 beats with a max rate of 200 bpm, the longest lasting 7 beats with an avg rate of 141 bpm.    Second Degree AV Block-Mobitz I (Wenckebach) was present. Isolated SVEs were rare (<1.0%), SVE Couplets were rare (<1.0%), and SVE Triplets were rare (<1.0%). Isolated VEs were rare (<1.0%, 3119), VE Couplets were rare (<1.0%, 171), and VE Triplets were rare (<1.0%, 1). Ventricular Bigeminy and Trigeminy were present.   Patient triggered events were not associated with significant arrhythmia. __________   Carotid artery ultrasound 01/11/2019: IMPRESSION: Minor carotid atherosclerosis. No hemodynamically significant ICA stenosis. Degree of narrowing less than 50% bilaterally by ultrasound criteria.   Patent antegrade vertebral flow bilaterally __________   Lexiscan  MPI 05/31/2018: Pharmacological myocardial perfusion imaging study with no significant  Ischemia Moderate size region of predominantly fixed perfusion defect of mild to moderate intensity of the mid to distal anterior and anteroseptal wall Unable to exclude breast attenuation artifact Normal wall motion, EF estimated at 41%, Depressed ejection fraction possibly secondary to GI uptake artifact No EKG changes concerning for ischemia at peak stress or in recovery. Low to moderate risk scan __________   2D echo 05/18/2018: - Left ventricle: The cavity size was  normal. Systolic  function was    mildly reduced. The estimated ejection fraction was in the range    of 45% to 50%. Mild diffuse hypokinesis. Regional wall motion    abnormalities cannot be excluded. Select images with anteroseptal    and apical hypokinesis. The study is not technically sufficient    to allow evaluation of LV diastolic function.  - Aortic valve: There was mild regurgitation.  - Mitral valve: There was mild regurgitation.  - Left atrium: The atrium was normal in size.  - Right ventricle: Systolic function was normal.  - Pulmonary arteries: Systolic pressure was within the normal    range. __________   Zio patch 03/2018: Sinus rhythm   Avg HR of 87 bpm.  First Degree AV Block was present.  Second Degree AV Block-Mobitz I (Wenckebach) was present.    2 Ventricular Tachycardia runs occurred,  the run with the fastest and longest interval lasting 16 beats with avg 183 bpm   Isolated SVEs were rare (<1.0%), SVE Triplets were rare (<1.0%), and no SVE Couplets were present.  Isolated VEs were rare (<1.0%, 2358), VE Couplets were rare (<1.0%, 140), and VE Triplets were rare (<1.0%, 3).  Ventricular Bigeminy was present.   Patient triggered events were not associated with significant arrhythmia   EKG:  EKG is ordered today.  The EKG ordered today demonstrates NSR, 95 bpm, low voltage QRS, nonspecific IVCD and ST-T changes  Recent Labs: 11/13/2023: ALT 14 11/14/2023: BUN 27; Creatinine, Ser 0.94; Hemoglobin 12.6; Platelets 177; Potassium 3.5; Sodium 141  Recent Lipid Panel    Component Value Date/Time   CHOL 175 06/26/2023 0357   CHOL 229 (H) 06/24/2022 0841   TRIG 99 06/26/2023 0357   HDL 59 06/26/2023 0357   HDL 67 06/24/2022 0841   CHOLHDL 3.0 06/26/2023 0357   VLDL 20 06/26/2023 0357   LDLCALC 96 06/26/2023 0357   LDLCALC 139 (H) 06/24/2022 0841    PHYSICAL EXAM:    VS:  BP 126/72 (BP Location: Left Arm, Patient Position: Sitting, Cuff Size: Normal)    Pulse 95   Ht 5' 7 (1.702 m)   Wt 229 lb 6.4 oz (104.1 kg)   SpO2 97%   BMI 35.93 kg/m   BMI: Body mass index is 35.93 kg/m.  Physical Exam Vitals reviewed.  Constitutional:      Appearance: She is well-developed.  HENT:     Head: Normocephalic and atraumatic.  Eyes:     General:        Right eye: No discharge.        Left eye: No discharge.  Cardiovascular:     Rate and Rhythm: Normal rate and regular rhythm.     Pulses:          Posterior tibial pulses are 2+ on the right side and 2+ on the left side.     Heart sounds: Normal heart sounds, S1 normal and S2 normal. Heart sounds not distant. No midsystolic click and no opening snap. No murmur heard.    No friction rub.  Pulmonary:     Effort: Pulmonary effort is normal. No respiratory distress.     Breath sounds: Normal breath sounds. No decreased breath sounds, wheezing, rhonchi or rales.  Musculoskeletal:     Cervical back: Normal range of motion.     Right lower leg: No edema.     Left lower leg: No edema.  Skin:    General: Skin is warm and dry.     Nails: There  is no clubbing.  Neurological:     Mental Status: She is alert and oriented to person, place, and time.  Psychiatric:        Speech: Speech normal.        Behavior: Behavior normal.        Thought Content: Thought content normal.        Judgment: Judgment normal.     Wt Readings from Last 3 Encounters:  06/17/24 229 lb 6.4 oz (104.1 kg)  01/31/24 200 lb (90.7 kg)  01/02/24 200 lb (90.7 kg)     ASSESSMENT & PLAN:   HFimpEF secondary to NICM: Euvolemic and well compensated.  Most recent echo from 05/2023 showed improvement in LV systolic function with low normal EF of 50 to 55%.  Remains on carvedilol  6.25 mg twice daily, Entresto  24/26 mg twice daily, and spironolactone  25 mg.  Not requiring a standing loop diuretic.  Obtain BMP.  SVT/atrial tachycardia status post ablation x 2: No symptoms of palpitations (typically symptomatic with SVT).  Remains on  carvedilol  6.25 mg twice daily.  Evaluated by EP with conservative therapy recommended with continuation of carvedilol .  For recurrent symptoms, would recommend reevaluation by EP.    Mitral and aortic regurgitation: Mild by echo in 05/2023.  Monitor with periodic echo.  History of CVA/TIA: No new deficits.  Prior CVA noted on neuroimaging.  Has historically, declined aspirin, clopidogrel , and statin therapy.  Unable to revisit initiation of aspirin therapy due to documented anaphylaxis.  Defer further discussion of clopidogrel  initiation to neurology.  History of bilateral pulmonary emboli: Occurred in the setting of Covid infection.  Status post therapy with apixaban.  Echo from 05/2023 demonstrated normal RV systolic function and ventricular cavity size.  HTN: Blood pressure is well-controlled in the office today.  Continue pharmacotherapy as outlined above.  HLD with statin intolerance: LDL 96 in 05/2023.  Tolerating ezetimibe  10 mg.     Disposition: F/u with Dr. Gollan in 12 months.   Medication Adjustments/Labs and Tests Ordered: Current medicines are reviewed at length with the patient today.  Concerns regarding medicines are outlined above. Medication changes, Labs and Tests ordered today are summarized above and listed in the Patient Instructions accessible in Encounters.   Signed, Bernardino Bring, PA-C 06/17/2024 4:36 PM     [facility] [place] [address] [place], [place] [address] [phone]     [1]  Current Meds  Medication Sig   acetaminophen  (TYLENOL ) 325 MG tablet Take 1-2 tablets (325-650 mg total) by mouth every 6 (six) hours as needed for mild pain (pain score 1-3) (or temp > 100.5).   alendronate  (FOSAMAX ) 70 MG tablet Take 1 tablet (70 mg total) by mouth every 7 (seven) days. Take with a full glass of water  on an empty stomach.   B Complex-C (B-COMPLEX WITH VITAMIN C) tablet Take 1 tablet by mouth daily.    calcium  carbonate (OS-CAL -  DOSED IN MG OF ELEMENTAL CALCIUM ) 1250 (500 Ca) MG tablet Take 1 tablet by mouth daily with breakfast.   cholecalciferol  (VITAMIN D ) 25 MCG (1000 UT) tablet Take 1,000 Units by mouth at bedtime.   Cranberry 1000 MG CAPS Take 1,000 mg by mouth daily.    ezetimibe  (ZETIA ) 10 MG tablet Take 1 tablet (10 mg total) by mouth daily.   Fluticasone  Furoate Ellipta 100 MCG/ACT AEPB Take 1 Inhalation by mouth daily at 6 (six) AM.   gabapentin  (NEURONTIN ) 300 MG capsule One capsule in the  morning and 3 capsules by mouth at bedtime.   HYDROcodone -acetaminophen  (NORCO/VICODIN) 5-325 MG tablet Take 1 tablet by mouth every 6 (six) hours as needed for moderate pain (pain score 4-6).   loratadine (CLARITIN) 10 MG tablet Take 10 mg by mouth daily as needed for allergies.   methocarbamol  (ROBAXIN ) 500 MG tablet Take 1 tablet (500 mg total) by mouth every 8 (eight) hours as needed.   Multiple Vitamins-Minerals (MULTIVITAMIN WITH MINERALS) tablet Take 1 tablet by mouth daily. Centrum Silver   ondansetron  (ZOFRAN ) 4 MG tablet Take 1 tablet (4 mg total) by mouth every 6 (six) hours as needed for nausea.   polyethylene glycol (MIRALAX  / GLYCOLAX ) 17 g packet Take 17 g by mouth daily as needed.   sacubitril -valsartan  (ENTRESTO ) 24-26 MG TAKE 1 TABLET BY MOUTH TWICE A DAY   spironolactone  (ALDACTONE ) 25 MG tablet Take 1 tablet (25 mg total) by mouth daily.   Turmeric 500 MG CAPS Take 500 mg by mouth daily.    [DISCONTINUED] carvedilol  (COREG ) 6.25 MG tablet Take 1 tablet (6.25 mg total) by mouth 2 (two) times daily with a meal.   "

## 2024-06-17 ENCOUNTER — Ambulatory Visit: Attending: Physician Assistant | Admitting: Physician Assistant

## 2024-06-17 ENCOUNTER — Encounter: Payer: Self-pay | Admitting: Physician Assistant

## 2024-06-17 VITALS — BP 126/72 | HR 95 | Ht 67.0 in | Wt 229.4 lb

## 2024-06-17 DIAGNOSIS — Z79899 Other long term (current) drug therapy: Secondary | ICD-10-CM | POA: Diagnosis not present

## 2024-06-17 DIAGNOSIS — I10 Essential (primary) hypertension: Secondary | ICD-10-CM | POA: Insufficient documentation

## 2024-06-17 DIAGNOSIS — I428 Other cardiomyopathies: Secondary | ICD-10-CM | POA: Insufficient documentation

## 2024-06-17 DIAGNOSIS — Z8673 Personal history of transient ischemic attack (TIA), and cerebral infarction without residual deficits: Secondary | ICD-10-CM | POA: Diagnosis not present

## 2024-06-17 DIAGNOSIS — I471 Supraventricular tachycardia, unspecified: Secondary | ICD-10-CM | POA: Insufficient documentation

## 2024-06-17 DIAGNOSIS — I4719 Other supraventricular tachycardia: Secondary | ICD-10-CM | POA: Diagnosis present

## 2024-06-17 DIAGNOSIS — Z789 Other specified health status: Secondary | ICD-10-CM | POA: Diagnosis not present

## 2024-06-17 DIAGNOSIS — E782 Mixed hyperlipidemia: Secondary | ICD-10-CM | POA: Diagnosis not present

## 2024-06-17 DIAGNOSIS — G459 Transient cerebral ischemic attack, unspecified: Secondary | ICD-10-CM | POA: Diagnosis not present

## 2024-06-17 DIAGNOSIS — T466X5A Adverse effect of antihyperlipidemic and antiarteriosclerotic drugs, initial encounter: Secondary | ICD-10-CM | POA: Diagnosis not present

## 2024-06-17 DIAGNOSIS — I5020 Unspecified systolic (congestive) heart failure: Secondary | ICD-10-CM | POA: Diagnosis present

## 2024-06-17 DIAGNOSIS — M791 Myalgia, unspecified site: Secondary | ICD-10-CM | POA: Diagnosis not present

## 2024-06-17 MED ORDER — CARVEDILOL 6.25 MG PO TABS: 6.2500 mg | ORAL_TABLET | Freq: Two times a day (BID) | ORAL | 11 refills | Status: AC

## 2024-06-17 NOTE — Patient Instructions (Signed)
 Medication Instructions:  Your physician recommends that you continue on your current medications as directed. Please refer to the Current Medication list given to you today.   *If you need a refill on your cardiac medications before your next appointment, please call your pharmacy*  Lab Work: Your provider would like for you to have following labs drawn today BMeT.   If you have labs (blood work) drawn today and your tests are completely normal, you will receive your results only by: MyChart Message (if you have MyChart) OR A paper copy in the mail If you have any lab test that is abnormal or we need to change your treatment, we will call you to review the results.  Follow-Up: At [facility], you and your health needs are our priority.  As part of our continuing mission to provide you with exceptional heart care, our providers are all part of one team.  This team includes your primary Cardiologist (physician) and Advanced Practice Providers or APPs (Physician Assistants and Nurse Practitioners) who all work together to provide you with the care you need, when you need it.  Your next appointment:   12 month(s)  Provider:   You may see Timothy Gollan, MD or Bernardino Bring, PA-C   We recommend signing up for the patient portal called MyChart.  Sign up information is provided on this After Visit Summary.  MyChart is used to connect with patients for Virtual Visits (Telemedicine).  Patients are able to view lab/test results, encounter notes, upcoming appointments, etc.  Non-urgent messages can be sent to your provider as well.   To learn more about what you can do with MyChart, go to [email or web address].

## 2024-06-18 ENCOUNTER — Ambulatory Visit: Payer: Self-pay | Admitting: Physician Assistant

## 2024-06-18 DIAGNOSIS — Z79899 Other long term (current) drug therapy: Secondary | ICD-10-CM

## 2024-06-18 LAB — BASIC METABOLIC PANEL WITH GFR
BUN/Creatinine Ratio: 16 (ref 12–28)
BUN: 20 mg/dL (ref 8–27)
CO2: 22 mmol/L (ref 20–29)
Calcium: 9.8 mg/dL (ref 8.7–10.3)
Chloride: 104 mmol/L (ref 96–106)
Creatinine, Ser: 1.26 mg/dL — ABNORMAL HIGH (ref 0.57–1.00)
Glucose: 120 mg/dL — ABNORMAL HIGH (ref 70–99)
Potassium: 4.7 mmol/L (ref 3.5–5.2)
Sodium: 142 mmol/L (ref 134–144)
eGFR: 44 mL/min/1.73 — ABNORMAL LOW

## 2024-06-24 ENCOUNTER — Emergency Department

## 2024-06-24 ENCOUNTER — Emergency Department
Admission: EM | Admit: 2024-06-24 | Discharge: 2024-06-24 | Disposition: A | Discharge status: Home / Self Care | Attending: Emergency Medicine | Admitting: Emergency Medicine

## 2024-06-24 ENCOUNTER — Other Ambulatory Visit: Payer: Self-pay

## 2024-06-24 DIAGNOSIS — I2510 Atherosclerotic heart disease of native coronary artery without angina pectoris: Secondary | ICD-10-CM | POA: Insufficient documentation

## 2024-06-24 DIAGNOSIS — I509 Heart failure, unspecified: Secondary | ICD-10-CM | POA: Diagnosis not present

## 2024-06-24 DIAGNOSIS — R079 Chest pain, unspecified: Secondary | ICD-10-CM | POA: Insufficient documentation

## 2024-06-24 LAB — TROPONIN T, HIGH SENSITIVITY
Troponin T High Sensitivity: 15 ng/L (ref 0–19)
Troponin T High Sensitivity: 15 ng/L (ref 0–19)

## 2024-06-24 LAB — BASIC METABOLIC PANEL WITH GFR
Anion gap: 10 (ref 5–15)
BUN: 22 mg/dL (ref 8–23)
CO2: 23 mmol/L (ref 22–32)
Calcium: 9.6 mg/dL (ref 8.9–10.3)
Chloride: 104 mmol/L (ref 98–111)
Creatinine, Ser: 1.11 mg/dL — ABNORMAL HIGH (ref 0.44–1.00)
GFR, Estimated: 51 mL/min — ABNORMAL LOW
Glucose, Bld: 111 mg/dL — ABNORMAL HIGH (ref 70–99)
Potassium: 4.4 mmol/L (ref 3.5–5.1)
Sodium: 138 mmol/L (ref 135–145)

## 2024-06-24 LAB — CBC
HCT: 48.9 % — ABNORMAL HIGH (ref 36.0–46.0)
Hemoglobin: 15.7 g/dL — ABNORMAL HIGH (ref 12.0–15.0)
MCH: 30.2 pg (ref 26.0–34.0)
MCHC: 32.1 g/dL (ref 30.0–36.0)
MCV: 94 fL (ref 80.0–100.0)
Platelets: 208 10*3/uL (ref 150–400)
RBC: 5.2 MIL/uL — ABNORMAL HIGH (ref 3.87–5.11)
RDW: 11.7 % (ref 11.5–15.5)
WBC: 7.4 10*3/uL (ref 4.0–10.5)
nRBC: 0 % (ref 0.0–0.2)

## 2024-06-24 MED ORDER — IOHEXOL 350 MG/ML SOLN
75.0000 mL | Freq: Once | INTRAVENOUS | Status: AC | PRN
  Administered 2024-06-24: 75 mL via INTRAVENOUS

## 2024-06-24 NOTE — ED Triage Notes (Signed)
 Pt comes in via pov with complaints of chest pain that started this morning. Pt states that the pain radiates to her bach, right shoulder and arm. Pt with a history of neuropathy in the right arm, but states this pain is not the same. Pt complains of pain 9/10 at this time.

## 2024-06-24 NOTE — ED Notes (Signed)
 Patient transported to X-ray

## 2024-06-24 NOTE — Discharge Instructions (Signed)
 Follow-up with your primary care provider and cardiologist.  Return to the ER for new, worsening, or persistent severe chest pain, difficulty breathing, or any other new or worsening symptoms that concern you.

## 2024-06-24 NOTE — ED Notes (Signed)
"  Iv team in with pt  "

## 2024-06-24 NOTE — ED Provider Notes (Signed)
 "  [facility] Provider Note    Event Date/Time   First MD Initiated Contact with Patient 06/24/24 1227     (approximate)   History   Chest Pain   HPI  Audrey Martinez is a 79 y.o. female with a history of PE, aortic atherosclerosis, CAD although reassuring cath 5 years ago, CHF who presents with complaints of chest pain.  Patient reports chest pain radiating to her back which was severe and started abruptly this morning, she reports it is improved now but is still mildly present.  No shortness of breath reported no pleurisy noted at this time     Physical Exam   Triage Vital Signs: ED Triage Vitals [06/24/24 1208]  Encounter Vitals Group     BP 108/62     Girls Systolic BP Percentile      Girls Diastolic BP Percentile      Boys Systolic BP Percentile      Boys Diastolic BP Percentile      Pulse Rate 93     Resp 17     Temp 98.7 F (37.1 C)     Temp src      SpO2 96 %     Weight      Height      Head Circumference      Peak Flow      Pain Score      Pain Loc      Pain Education      Exclude from Growth Chart     Most recent vital signs: Vitals:   06/24/24 1208  BP: 108/62  Pulse: 93  Resp: 17  Temp: 98.7 F (37.1 C)  SpO2: 96%     General: Awake, no distress.  CV:  Good peripheral perfusion.  Regular rate and rhythm Resp:  Normal effort.  Clear to auscultation bilaterally Abd:  No distention.  Other:  Equal pulses in the upper extremities   ED Results / Procedures / Treatments   Labs (all labs ordered are listed, but only abnormal results are displayed) Labs Reviewed  BASIC METABOLIC PANEL WITH GFR - Abnormal; Notable for the following components:      Result Value   Glucose, Bld 111 (*)    Creatinine, Ser 1.11 (*)    GFR, Estimated 51 (*)    All other components within normal limits  CBC - Abnormal; Notable for the following components:   RBC 5.20 (*)    Hemoglobin 15.7 (*)    HCT 48.9 (*)    All other  components within normal limits  TROPONIN T, HIGH SENSITIVITY  TROPONIN T, HIGH SENSITIVITY     EKG  ED ECG REPORT I, Lamar Price, the attending physician, personally viewed and interpreted this ECG.  Date: 06/24/2024  Rhythm: normal sinus rhythm QRS Axis: normal Intervals: normal ST/T Wave abnormalities: normal Narrative Interpretation: no evidence of acute ischemia    RADIOLOGY Chest x-ray view interpret by me, no acute abnormality    PROCEDURES:  Critical Care performed:   Procedures   MEDICATIONS ORDERED IN ED: Medications - No data to display   IMPRESSION / MDM / ASSESSMENT AND PLAN / ED COURSE  I reviewed the triage vital signs and the nursing notes. Patient's presentation is most consistent with acute presentation with potential threat to life or bodily function.  Patient presents with chest pain rating to her back, differential includes ACS, musculoskeletal pain, PE, AAS  Pain is improved significantly however given her description of  earlier event will send for CT angiography  High sensitive troponin, EKG are reassuring, pending second troponin  I have asked my colleague to follow-up on CT angiography.        FINAL CLINICAL IMPRESSION(S) / ED DIAGNOSES   Final diagnoses:  Nonspecific chest pain     Rx / DC Orders   ED Discharge Orders     None        Note:  This document was prepared using Dragon voice recognition software and may include unintentional dictation errors.   Arlander Charleston, MD 06/24/24 1456  "

## 2024-06-24 NOTE — Progress Notes (Signed)
 [facility] Liaison Note  This patient is currently followed by [facility]'s GUIDE program.  [place] will follow through discharge disposition.    Please call with any GUIDE questions or concerns.  [facility] Liaison [phone]

## 2024-06-24 NOTE — ED Provider Notes (Signed)
----------------------------------------- °  5:45 PM on 06/24/2024 -----------------------------------------  I took over care of this patient from Dr. Arlander.  Troponins are negative x 2.  CTA shows no evidence of aortic dissection or of PE.  CT angio chest:  IMPRESSION:  1. No thoracic aortic aneurysm or dissection.  2. No pulmonary embolism or acute pulmonary abnormality.    On reassessment, the patient is not having any chest pain or other acute symptoms.  I counseled her on the results of the workup and answered all of her questions.  At this time given the negative workup and resolved symptoms, she is stable for discharge.  The patient feels comfortable going home.  I gave strict return precautions, and she expressed understanding.   Jacolyn Pae, MD 06/24/24 1746

## 2024-06-24 NOTE — ED Notes (Addendum)
 Resumed care from [facility] rn.  Pt alert family with pt  nsr on monitor.   Iv team consult ordered.

## 2024-07-31 ENCOUNTER — Ambulatory Visit: Admitting: Student
